# Patient Record
Sex: Male | Born: 1955 | Marital: Married | Smoking: Never smoker
Health system: Southern US, Community
[De-identification: ages and names within clinical notes are randomized; demographics above are authoritative.]

## PROBLEM LIST (undated history)

## (undated) DIAGNOSIS — N200 Calculus of kidney: Secondary | ICD-10-CM

## (undated) DIAGNOSIS — Z87442 Personal history of urinary calculi: Secondary | ICD-10-CM

## (undated) DIAGNOSIS — I10 Essential (primary) hypertension: Secondary | ICD-10-CM

## (undated) DIAGNOSIS — G4700 Insomnia, unspecified: Secondary | ICD-10-CM

## (undated) HISTORY — DX: Essential (primary) hypertension: I10

## (undated) HISTORY — PX: COLONOSCOPY: SHX174

## (undated) HISTORY — DX: Calculus of kidney: N20.0

## (undated) HISTORY — DX: Insomnia, unspecified: G47.00

## (undated) HISTORY — PX: ARTHROSCOPIC REPAIR ACL: SUR80

---

## 2011-06-11 ENCOUNTER — Encounter: Payer: Self-pay | Admitting: Gastroenterology

## 2011-06-25 ENCOUNTER — Ambulatory Visit (AMBULATORY_SURGERY_CENTER): Payer: Managed Care, Other (non HMO) | Admitting: *Deleted

## 2011-06-25 VITALS — Ht 68.0 in | Wt 187.7 lb

## 2011-06-25 DIAGNOSIS — Z1211 Encounter for screening for malignant neoplasm of colon: Secondary | ICD-10-CM

## 2011-06-25 MED ORDER — PEG-KCL-NACL-NASULF-NA ASC-C 100 G PO SOLR: ORAL | Status: DC

## 2011-07-09 ENCOUNTER — Encounter: Payer: Self-pay | Admitting: Gastroenterology

## 2011-07-09 ENCOUNTER — Ambulatory Visit (AMBULATORY_SURGERY_CENTER): Payer: Managed Care, Other (non HMO) | Admitting: Gastroenterology

## 2011-07-09 VITALS — BP 118/81 | HR 56 | Temp 97.9°F | Resp 17 | Ht 68.0 in | Wt 187.0 lb

## 2011-07-09 DIAGNOSIS — K6389 Other specified diseases of intestine: Secondary | ICD-10-CM | POA: Insufficient documentation

## 2011-07-09 DIAGNOSIS — Z1211 Encounter for screening for malignant neoplasm of colon: Secondary | ICD-10-CM

## 2011-07-09 DIAGNOSIS — K5730 Diverticulosis of large intestine without perforation or abscess without bleeding: Secondary | ICD-10-CM

## 2011-07-09 MED ORDER — SODIUM CHLORIDE 0.9 % IV SOLN: 500.0000 mL | INTRAVENOUS | Status: DC

## 2011-07-09 NOTE — Patient Instructions (Signed)
Please refer to the blue and neon green sheets for instructions regarding diet and activity for the rest of today.  Handout on diverticulosis and a high fiber diet given. Resume previous medication.

## 2011-07-10 ENCOUNTER — Telehealth: Payer: Self-pay

## 2011-07-10 NOTE — Telephone Encounter (Signed)
No ID on answering machine. 

## 2020-01-20 DIAGNOSIS — E872 Acidosis: Secondary | ICD-10-CM | POA: Diagnosis not present

## 2020-01-20 DIAGNOSIS — K8309 Other cholangitis: Secondary | ICD-10-CM

## 2020-01-20 DIAGNOSIS — N179 Acute kidney failure, unspecified: Secondary | ICD-10-CM | POA: Diagnosis not present

## 2020-01-20 DIAGNOSIS — K859 Acute pancreatitis without necrosis or infection, unspecified: Secondary | ICD-10-CM

## 2020-01-20 DIAGNOSIS — A419 Sepsis, unspecified organism: Secondary | ICD-10-CM

## 2020-01-21 ENCOUNTER — Inpatient Hospital Stay (HOSPITAL_COMMUNITY): Payer: 59 | Admitting: Anesthesiology

## 2020-01-21 ENCOUNTER — Encounter (HOSPITAL_COMMUNITY): Payer: Self-pay | Admitting: Pulmonary Disease

## 2020-01-21 ENCOUNTER — Encounter (HOSPITAL_COMMUNITY)
Admission: AD | Disposition: A | Discharge status: Home / Self Care | Payer: Self-pay | Source: Other Acute Inpatient Hospital | Attending: Internal Medicine

## 2020-01-21 ENCOUNTER — Inpatient Hospital Stay (HOSPITAL_COMMUNITY): Payer: 59

## 2020-01-21 ENCOUNTER — Other Ambulatory Visit: Payer: Self-pay

## 2020-01-21 ENCOUNTER — Inpatient Hospital Stay (HOSPITAL_COMMUNITY)
Admission: AD | Admit: 2020-01-21 | Discharge: 2020-01-25 | DRG: 853 | Disposition: A | Discharge status: Home / Self Care | Payer: 59 | Source: Other Acute Inpatient Hospital | Attending: Internal Medicine | Admitting: Internal Medicine

## 2020-01-21 DIAGNOSIS — K831 Obstruction of bile duct: Secondary | ICD-10-CM | POA: Diagnosis not present

## 2020-01-21 DIAGNOSIS — R17 Unspecified jaundice: Secondary | ICD-10-CM | POA: Diagnosis not present

## 2020-01-21 DIAGNOSIS — K8063 Calculus of gallbladder and bile duct with acute cholecystitis with obstruction: Secondary | ICD-10-CM | POA: Diagnosis present

## 2020-01-21 DIAGNOSIS — R6521 Severe sepsis with septic shock: Secondary | ICD-10-CM | POA: Diagnosis present

## 2020-01-21 DIAGNOSIS — R1013 Epigastric pain: Secondary | ICD-10-CM

## 2020-01-21 DIAGNOSIS — E869 Volume depletion, unspecified: Secondary | ICD-10-CM | POA: Diagnosis present

## 2020-01-21 DIAGNOSIS — E785 Hyperlipidemia, unspecified: Secondary | ICD-10-CM | POA: Diagnosis present

## 2020-01-21 DIAGNOSIS — K859 Acute pancreatitis without necrosis or infection, unspecified: Secondary | ICD-10-CM | POA: Diagnosis present

## 2020-01-21 DIAGNOSIS — A4151 Sepsis due to Escherichia coli [E. coli]: Secondary | ICD-10-CM | POA: Diagnosis present

## 2020-01-21 DIAGNOSIS — R7989 Other specified abnormal findings of blood chemistry: Secondary | ICD-10-CM

## 2020-01-21 DIAGNOSIS — K851 Biliary acute pancreatitis without necrosis or infection: Secondary | ICD-10-CM | POA: Diagnosis present

## 2020-01-21 DIAGNOSIS — R7881 Bacteremia: Secondary | ICD-10-CM

## 2020-01-21 DIAGNOSIS — K821 Hydrops of gallbladder: Secondary | ICD-10-CM | POA: Diagnosis present

## 2020-01-21 DIAGNOSIS — B9620 Unspecified Escherichia coli [E. coli] as the cause of diseases classified elsewhere: Secondary | ICD-10-CM

## 2020-01-21 DIAGNOSIS — N179 Acute kidney failure, unspecified: Secondary | ICD-10-CM | POA: Diagnosis present

## 2020-01-21 DIAGNOSIS — Z20822 Contact with and (suspected) exposure to covid-19: Secondary | ICD-10-CM | POA: Diagnosis present

## 2020-01-21 DIAGNOSIS — K82A1 Gangrene of gallbladder in cholecystitis: Secondary | ICD-10-CM | POA: Diagnosis present

## 2020-01-21 DIAGNOSIS — R000 Tachycardia, unspecified: Secondary | ICD-10-CM

## 2020-01-21 DIAGNOSIS — I10 Essential (primary) hypertension: Secondary | ICD-10-CM | POA: Diagnosis present

## 2020-01-21 DIAGNOSIS — K8309 Other cholangitis: Secondary | ICD-10-CM

## 2020-01-21 DIAGNOSIS — R933 Abnormal findings on diagnostic imaging of other parts of digestive tract: Secondary | ICD-10-CM

## 2020-01-21 DIAGNOSIS — K8050 Calculus of bile duct without cholangitis or cholecystitis without obstruction: Secondary | ICD-10-CM

## 2020-01-21 HISTORY — PX: PANCREATIC STENT PLACEMENT: SHX5539

## 2020-01-21 HISTORY — PX: ERCP: SHX5425

## 2020-01-21 LAB — COMPREHENSIVE METABOLIC PANEL
ALT: 128 U/L — ABNORMAL HIGH (ref 0–44)
AST: 140 U/L — ABNORMAL HIGH (ref 15–41)
Albumin: 2.3 g/dL — ABNORMAL LOW (ref 3.5–5.0)
Alkaline Phosphatase: 147 U/L — ABNORMAL HIGH (ref 38–126)
Anion gap: 12 (ref 5–15)
BUN: 41 mg/dL — ABNORMAL HIGH (ref 8–23)
CO2: 25 mmol/L (ref 22–32)
Calcium: 7.9 mg/dL — ABNORMAL LOW (ref 8.9–10.3)
Chloride: 100 mmol/L (ref 98–111)
Creatinine, Ser: 2.22 mg/dL — ABNORMAL HIGH (ref 0.61–1.24)
GFR calc Af Amer: 35 mL/min — ABNORMAL LOW (ref >60)
GFR calc non Af Amer: 30 mL/min — ABNORMAL LOW (ref >60)
Glucose, Bld: 114 mg/dL — ABNORMAL HIGH (ref 70–99)
Potassium: 3.7 mmol/L (ref 3.5–5.1)
Sodium: 137 mmol/L (ref 135–145)
Total Bilirubin: 6.7 mg/dL — ABNORMAL HIGH (ref 0.3–1.2)
Total Protein: 5.4 g/dL — ABNORMAL LOW (ref 6.5–8.1)

## 2020-01-21 LAB — GLUCOSE, CAPILLARY
Glucose-Capillary: 88 mg/dL (ref 70–99)
Glucose-Capillary: 132 mg/dL — ABNORMAL HIGH (ref 70–99)

## 2020-01-21 LAB — CBC
HCT: 37.7 % — ABNORMAL LOW (ref 39.0–52.0)
Hemoglobin: 12.9 g/dL — ABNORMAL LOW (ref 13.0–17.0)
MCH: 30.9 pg (ref 26.0–34.0)
MCHC: 34.2 g/dL (ref 30.0–36.0)
MCV: 90.4 fL (ref 80.0–100.0)
Platelets: 172 10*3/uL (ref 150–400)
RBC: 4.17 MIL/uL — ABNORMAL LOW (ref 4.22–5.81)
RDW: 12.6 % (ref 11.5–15.5)
WBC: 16.8 10*3/uL — ABNORMAL HIGH (ref 4.0–10.5)
nRBC: 0 % (ref 0.0–0.2)

## 2020-01-21 LAB — LIPASE, BLOOD: Lipase: 785 U/L — ABNORMAL HIGH (ref 11–51)

## 2020-01-21 LAB — HIV ANTIBODY (ROUTINE TESTING W REFLEX): HIV Screen 4th Generation wRfx: NONREACTIVE

## 2020-01-21 LAB — TRIGLYCERIDES: Triglycerides: 116 mg/dL (ref <150)

## 2020-01-21 LAB — PHOSPHORUS: Phosphorus: 2.8 mg/dL (ref 2.5–4.6)

## 2020-01-21 LAB — MRSA PCR SCREENING: MRSA by PCR: NEGATIVE

## 2020-01-21 LAB — MAGNESIUM: Magnesium: 1.8 mg/dL (ref 1.7–2.4)

## 2020-01-21 SURGERY — ERCP, WITH INTERVENTION IF INDICATED
Anesthesia: General

## 2020-01-21 MED ORDER — SODIUM CHLORIDE 0.9 % IV SOLN
3.0000 g | Freq: Four times a day (QID) | INTRAVENOUS | Status: DC
  Administered 2020-01-21 – 2020-01-25 (×14): 3 g via INTRAVENOUS
  Filled 2020-01-21 (×2): qty 8
  Filled 2020-01-21: qty 3
  Filled 2020-01-21 (×4): qty 8
  Filled 2020-01-21: qty 3
  Filled 2020-01-21 (×6): qty 8
  Filled 2020-01-21 (×2): qty 3
  Filled 2020-01-21 (×2): qty 8

## 2020-01-21 MED ORDER — FENTANYL CITRATE (PF) 100 MCG/2ML IJ SOLN: INTRAMUSCULAR | Status: DC | PRN

## 2020-01-21 MED ORDER — INDOMETHACIN 50 MG RE SUPP
RECTAL | Status: DC | PRN
  Administered 2020-01-21: 100 mg via RECTAL

## 2020-01-21 MED ORDER — DOCUSATE SODIUM 100 MG PO CAPS: 100.0000 mg | ORAL_CAPSULE | Freq: Two times a day (BID) | ORAL | Status: DC | PRN

## 2020-01-21 MED ORDER — INDOMETHACIN 50 MG RE SUPP
100.0000 mg | Freq: Once | RECTAL | Status: AC
  Administered 2020-01-24: 25 mg via RECTAL

## 2020-01-21 MED ORDER — PANTOPRAZOLE SODIUM 40 MG IV SOLR
40.0000 mg | INTRAVENOUS | Status: DC
  Administered 2020-01-21: 40 mg via INTRAVENOUS
  Filled 2020-01-21: qty 40

## 2020-01-21 MED ORDER — DEXTROSE IN LACTATED RINGERS 5 % IV SOLN
INTRAVENOUS | Status: DC
  Administered 2020-01-23 (×2): 1000 mL via INTRAVENOUS

## 2020-01-21 MED ORDER — GLUCAGON HCL RDNA (DIAGNOSTIC) 1 MG IJ SOLR
INTRAMUSCULAR | Status: DC | PRN
Start: 2020-01-21 — End: 2020-01-21
  Administered 2020-01-21 (×4): .25 mg via INTRAVENOUS

## 2020-01-21 MED ORDER — LIDOCAINE 2% (20 MG/ML) 5 ML SYRINGE
INTRAMUSCULAR | Status: DC | PRN
  Administered 2020-01-21: 10 mg via INTRAVENOUS
  Administered 2020-01-21: 40 mg via INTRAVENOUS

## 2020-01-21 MED ORDER — SODIUM CHLORIDE 0.9 % IV SOLN
INTRAVENOUS | Status: DC | PRN
  Administered 2020-01-21: 4 mL

## 2020-01-21 MED ORDER — ONDANSETRON HCL 4 MG/2ML IJ SOLN
4.0000 mg | Freq: Four times a day (QID) | INTRAMUSCULAR | Status: DC | PRN
  Administered 2020-01-21: 4 mg via INTRAVENOUS

## 2020-01-21 MED ORDER — GLUCAGON HCL RDNA (DIAGNOSTIC) 1 MG IJ SOLR
INTRAMUSCULAR | Status: AC
  Filled 2020-01-21: qty 1

## 2020-01-21 MED ORDER — MORPHINE SULFATE (PF) 2 MG/ML IV SOLN
2.0000 mg | INTRAVENOUS | Status: DC | PRN
  Administered 2020-01-21 – 2020-01-24 (×4): 2 mg via INTRAVENOUS
  Filled 2020-01-21 (×5): qty 1

## 2020-01-21 MED ORDER — INDOMETHACIN 50 MG RE SUPP
RECTAL | Status: AC
  Filled 2020-01-21: qty 2

## 2020-01-21 MED ORDER — PIPERACILLIN-TAZOBACTAM 3.375 G IVPB
3.3750 g | Freq: Three times a day (TID) | INTRAVENOUS | Status: DC
  Administered 2020-01-21: 3.375 g via INTRAVENOUS
  Filled 2020-01-21 (×2): qty 50

## 2020-01-21 MED ORDER — LACTATED RINGERS IV SOLN: INTRAVENOUS | Status: DC | PRN

## 2020-01-21 MED ORDER — POLYETHYLENE GLYCOL 3350 17 G PO PACK: 17.0000 g | PACK | Freq: Every day | ORAL | Status: DC | PRN

## 2020-01-21 MED ORDER — SUGAMMADEX SODIUM 200 MG/2ML IV SOLN
INTRAVENOUS | Status: DC | PRN
  Administered 2020-01-21: 300 mg via INTRAVENOUS

## 2020-01-21 MED ORDER — SUCCINYLCHOLINE CHLORIDE 20 MG/ML IJ SOLN
INTRAMUSCULAR | Status: DC | PRN
Start: 2020-01-21 — End: 2020-01-21
  Administered 2020-01-21: 140 mg via INTRAVENOUS

## 2020-01-21 MED ORDER — PROPOFOL 10 MG/ML IV BOLUS
INTRAVENOUS | Status: DC | PRN
  Administered 2020-01-21: 150 mg via INTRAVENOUS

## 2020-01-21 MED ORDER — FENTANYL CITRATE (PF) 100 MCG/2ML IJ SOLN
INTRAMUSCULAR | Status: DC | PRN
  Administered 2020-01-21: 100 ug via INTRAVENOUS

## 2020-01-21 MED ORDER — MAGNESIUM SULFATE 2 GM/50ML IV SOLN
2.0000 g | Freq: Once | INTRAVENOUS | Status: AC
  Administered 2020-01-21: 2 g via INTRAVENOUS
  Filled 2020-01-21: qty 50

## 2020-01-21 MED ORDER — DEXTROSE IN LACTATED RINGERS 5 % IV SOLN: INTRAVENOUS | Status: DC

## 2020-01-21 MED ORDER — PROPOFOL 10 MG/ML IV BOLUS: INTRAVENOUS | Status: DC | PRN

## 2020-01-21 MED ORDER — LIDOCAINE 2% (20 MG/ML) 5 ML SYRINGE: INTRAMUSCULAR | Status: DC | PRN

## 2020-01-21 MED ORDER — HEPARIN SODIUM (PORCINE) 5000 UNIT/ML IJ SOLN: 5000.0000 [IU] | Freq: Three times a day (TID) | INTRAMUSCULAR | Status: DC

## 2020-01-21 MED ORDER — ZOLPIDEM TARTRATE 5 MG PO TABS
5.0000 mg | ORAL_TABLET | Freq: Every evening | ORAL | Status: DC | PRN
  Administered 2020-01-21: 5 mg via ORAL
  Filled 2020-01-21: qty 1

## 2020-01-21 MED ORDER — CHLORHEXIDINE GLUCONATE CLOTH 2 % EX PADS
6.0000 | MEDICATED_PAD | Freq: Every day | CUTANEOUS | Status: DC
  Administered 2020-01-21: 6 via TOPICAL

## 2020-01-21 NOTE — Progress Notes (Signed)
Pt arrives to 5W38 from Endoscopy after being transferred from 40M. Pt alert and oriented to room with no c/o. Bed locked and low, call bell within reach.

## 2020-01-21 NOTE — H&P (View-Only) (Signed)
Consultation  Referring Provider: CCM/Akingbade\ Primary Care Physician:  Melony Overly, MD Primary Gastroenterologist:  none  Reason for Consultation: Acute presumed gallstone pancreatitis with sepsis/E. coli bacteremia  HPI: Jakhai Fant is a 64 y.o. male, who was transferred from [facility] last night after he had presented yesterday with acute abdominal pain nausea and vomiting.  Per notes he had been having intermittent abdominal pain over the past year, but acutely over the prior 3 days had been having abdominal pain nausea and vomiting.  He presented to the emergency room, was noted to be febrile and tachycardic.  After admission he became hypotensive and required pressors and was therefore transferred here. CT of the abdomen and pelvis done at [facility] showed acute cholecystitis, acute interstitial pancreatitis without necrosis or fluid collections.  No definite ductal dilation noted on CT HIDA scan showed nonvisualization of the gallbladder. Initial labs WBC of 19,000, lipase greater than 30,000, LFTs were elevated with T bili of 4.7alkphos 301, AST 351 and ALT 138. INR 1.1 Since arrival here he has been off of pressors. On Zosyn Labs today show WBC of 16.8, hemoglobin 12 point hematocrit 37.7 T bili 6.7/alk phos 147/AST 140/ALT 128 Creatinine 2.2, triglycerides pending Lipase 785  Patient says he is feeling much better today, denies any significant abdominal pain, just feels sore from vomiting.  He says he is achy all over.  He has not had any nausea or vomiting since admission. He has been hemodynamically stable, current blood pressure 112/70, pulse in the 90s, sats good on room air.    Past Medical History:  Diagnosis Date  . Hypertension   . Insomnia   . Kidney stones     Past Surgical History:  Procedure Laterality Date  . ARTHROSCOPIC REPAIR ACL     left, 2012; right 2004    Prior to Admission medications   Medication Sig Start Date End Date  Taking? Authorizing Provider  Calcium Carbonate (CALCIUM 600 PO) Take by mouth daily.      [provider]  Cholecalciferol (VITAMIN D PO) Take by mouth daily.      [provider]  hydrochlorothiazide (HYDRODIURIL) 25 MG tablet Take 25 mg by mouth daily.      [provider]  Multiple Vitamin (MULTIVITAMIN) tablet Take 1 tablet by mouth daily.      [provider]  niacin 500 MG tablet Take 500 mg by mouth daily with breakfast.      [provider]  Omega-3 Fatty Acids (FISH OIL) 1200 MG CAPS Take 2 capsules by mouth 2 (two) times daily.      [provider]  POTASSIUM GLUCONATE PO Take 99 mg by mouth 2 (two) times daily.      [provider]  Red Yeast Rice Extract (RED YEAST RICE PO) Take by mouth daily.      [provider]  sertraline (ZOLOFT) 50 MG tablet Take 50 mg by mouth daily.      [provider]  zolpidem (AMBIEN) 10 MG tablet Take 10 mg by mouth at bedtime as needed.      [provider]    Current Facility-Administered Medications  Medication Dose Route Frequency Provider Last Rate Last Admin  . Chlorhexidine Gluconate Cloth 2 % PADS 6 each  6 each Topical Q0600 Gleason, Otilio Carpen, PA-C   6 each at 01/21/20 0332  . dextrose 5 % in lactated ringers infusion   Intravenous Continuous Gleason, Otilio Carpen, PA-C 150 mL/hr at  01/21/20 0700 Rate Verify at 01/21/20 0700  . docusate sodium (COLACE) capsule 100 mg  100 mg Oral BID PRN Gleason, Otilio Carpen, PA-C      . magnesium sulfate IVPB 2 g 50 mL  2 g Intravenous Once Gleason, Otilio Carpen, PA-C      . morphine 2 MG/ML injection 2 mg  2 mg Intravenous Q4H PRN Gleason, Otilio Carpen, PA-C   2 mg at 01/21/20 0433  . ondansetron (ZOFRAN) injection 4 mg  4 mg Intravenous Q6H PRN Gleason, Otilio Carpen, PA-C      . pantoprazole (PROTONIX) injection 40 mg  40 mg Intravenous Q24H Gleason, Otilio Carpen, PA-C   40 mg at 01/21/20 0516  . piperacillin-tazobactam (ZOSYN) IVPB 3.375 g  3.375  g Intravenous Q8H Bryk, Veronda P, RPH 12.5 mL/hr at 01/21/20 0700 Rate Verify at 01/21/20 0700  . polyethylene glycol (MIRALAX / GLYCOLAX) packet 17 g  17 g Oral Daily PRN Gleason, Otilio Carpen, PA-C      . zolpidem (AMBIEN) tablet 5 mg  5 mg Oral QHS PRN Gleason, Otilio Carpen, PA-C        Allergies as of 01/20/2020  . (No Known Allergies)    Family History  Problem Relation Age of Onset  . Colon cancer Neg Hx   . Stomach cancer Neg Hx     Social History   Socioeconomic History  . Marital status: Married    Spouse name: Not on file  . Number of children: Not on file  . Years of education: Not on file  . Highest education level: Not on file  Occupational History  . Not on file  Tobacco Use  . Smoking status: Never Smoker  . Smokeless tobacco: Never Used  Substance and Sexual Activity  . Alcohol use: Yes    Alcohol/week: 2.0 standard drinks    Types: 2 Glasses of wine per week    Comment: occasional  . Drug use: No  . Sexual activity: Not Currently    Partners: Female  Other Topics Concern  . Not on file  Social History Narrative  . Not on file   Social Determinants of Health   Financial Resource Strain:   . Difficulty of Paying Living Expenses:   Food Insecurity:   . Worried About Charity fundraiser in the Last Year:   . Arboriculturist in the Last Year:   Transportation Needs:   . Film/video editor (Medical):   Marland Kitchen Lack of Transportation (Non-Medical):   Physical Activity:   . Days of Exercise per Week:   . Minutes of Exercise per Session:   Stress:   . Feeling of Stress :   Social Connections:   . Frequency of Communication with Friends and Family:   . Frequency of Social Gatherings with Friends and Family:   . Attends Religious Services:   . Active Member of Clubs or Organizations:   . Attends Archivist Meetings:   Marland Kitchen Marital Status:   Intimate Partner Violence:   . Fear of Current or Ex-Partner:   . Emotionally Abused:   Marland Kitchen Physically Abused:     . Sexually Abused:     Review of Systems: Pertinent positive and negative review of systems were noted in the above HPI section.  All other review of systems was otherwise negative.Marland Kitchen  Physical Exam: Vital signs in last 24 hours: Temp:  [98.1 F (36.7 C)-99.7 F (37.6 C)] 99.3 F (37.4 C) (05/15 0700) Pulse Rate:  [84-95]  87 (05/15 0700) Resp:  [19-30] 27 (05/15 0700) BP: (96-114)/(60-85) 105/85 (05/15 0630) SpO2:  [89 %-99 %] 93 % (05/15 0700) Weight:  [86.2 kg] 86.2 kg (05/15 0315) Last BM Date: 01/20/20 General:   Alert,  Well-developed, well-nourished, jaundiced white male, pleasant and cooperative in NAD Head:  Normocephalic and atraumatic. Eyes:  Sclera icteric conjunctiva pink. Ears:  Normal auditory acuity. Nose:  No deformity, discharge,  or lesions. Mouth:  No deformity or lesions.   Neck:  Supple; no masses or thyromegaly. Lungs:  Clear throughout to auscultation.   No wheezes, crackles, or rhonchi. Heart:  Regular rate and rhythm; no murmurs, clicks, rubs,  or gallops. Abdomen:  Soft, is tender in the epigastrium and right upper quadrant, no rebound BS active,nonpalp mass or hsm.   Rectal:  Deferred  Msk:  Symmetrical without gross deformities. . Pulses:  Normal pulses noted. Extremities:  Without clubbing or edema. Neurologic:  Alert and  oriented x4;  grossly normal neurologically. Skin:  Intact without significant lesions or rashes.. Psych:  Alert and cooperative. Normal mood and affect.  Intake/Output from previous day: 05/14 0701 - 05/15 0700 In: 384.5 [I.V.:363.8; IV Piggyback:20.7] Out: -  Intake/Output this shift: No intake/output data recorded.  Lab Results: Recent Labs    01/21/20 0426  WBC 16.8*  HGB 12.9*  HCT 37.7*  PLT 172   BMET Recent Labs    01/21/20 0426  NA 137  K 3.7  CL 100  CO2 25  GLUCOSE 114*  BUN 41*  CREATININE 2.22*  CALCIUM 7.9*   LFT Recent Labs    01/21/20 0426  PROT 5.4*  ALBUMIN 2.3*  AST 140*  ALT  128*  ALKPHOS 147*  BILITOT 6.7*   PT/INR No results for input(s): LABPROT, INR in the last 72 hours. Hepatitis Panel No results for input(s): HEPBSAG, HCVAB, HEPAIGM, HEPBIGM in the last 72 hours.   IMPRESSION:  #47 64 year old white male with 4-day history of acute abdominal pain nausea vomiting, with work-up consistent with gallstone pancreatitis, with cholangitis and  sepsis.  He has an E. coli bacteremia for which he is on Zosyn. Required pressors yesterday, has been off pressors since arrival here and very hemodynamically stable.  He has had significant improvement in abdominal pain, is afebrile this morning, sats good on room air  He has persistent hyperbilirubinemia with with rising bilirubin, and persistently elevated LFTs concerning for choledocholithiasis  #2 possible cholecystitis #3 acute kidney injury-improving #4 history of hypertension and hyperlipidemia #5  Epigastric pain PLAN: Keep n.p.o. this a.m. Patient will be scheduled for ERCP with stone extraction per Dr. Fuller Plan today.  Procedure was discussed in detail with patient including indications risks and benefits and he is agreeable to proceed. INR was normal yesterday, and Covid negative at [place] Follow-up labs in a.m. Nursing questioning removal of central line, this can be done post ERCP as long as he remains stable.    Amy EsterwoodPA-C  01/21/2020, 8:34 AM  I have reviewed the entire case in detail with the above APP and discussed the plan in detail while we saw this patient together earlier this morning.  Therefore, I agree with the diagnoses recorded above. In addition,  I am unable to review the CT images because they were done at an outside hospital.  All reports from [facility] were reviewed.  My additional thoughts are as follows:  He presented septic with acute biliary pancreatitis yesterday and was briefly on pressors, though I suspect  may just not have been  adequately volume resuscitated at the time.  E. coli bacteremia with persistent hyperbilirubinemia suggests choledocholithiasis and possible cholangitis.  The HIDA scan at [facility] did not fill the gallbladder and therefore suggested cholecystitis, and there is some reported pericholecystic edema and gallbladder wall thickening.  However, I think the appearance of the gallbladder is most likely reactive from the acute pancreatitis, and the HIDA scan results most likely because of an obstructing stone in the common hepatic duct.  The patient has been adequately volume resuscitated, his vital signs are now normal, he has antibiotics on board, and is ready for further intervention.  He needs an ERCP, which we discussed and described for him in detail.  The benefits and risks of the planned procedure were described in detail with the patient or (when appropriate) their health care proxy.  Risks were outlined as including, but not limited to, bleeding, infection, perforation, adverse medication reaction leading to cardiac or pulmonary decompensation, pancreatitis (if ERCP).  The limitation of incomplete mucosal visualization was also discussed.  No guarantees or warranties were given.  Patient at increased risk for cardiopulmonary complications of procedure due to medical comorbidities.  Dr. Lucio Edward will perform this patient's ERCP later today and the case was discussed with him. Further recommendations to follow the ERCP.   Nelida Meuse III Office:[phone]

## 2020-01-21 NOTE — Interval H&P Note (Signed)
History and Physical Interval Note:  01/21/2020 12:50 PM  Adam Fowler  has presented today for surgery, with the diagnosis of gallstone pancreatitis.  The various methods of treatment have been discussed with the patient and family. After consideration of risks, benefits and other options for treatment, the patient has consented to  Procedure(s): ENDOSCOPIC RETROGRADE CHOLANGIOPANCREATOGRAPHY (ERCP) (N/A) as a surgical intervention.  The patient's history has been reviewed, patient examined, no change in status, stable for surgery.  I have reviewed the patient's chart and labs.  Questions were answered to the patient's satisfaction.     Pricilla Riffle. Fuller Plan

## 2020-01-21 NOTE — Anesthesia Procedure Notes (Signed)
Procedure Name: Intubation Date/Time: 01/21/2020 1:37 PM Performed by: Eligha Bridegroom, CRNA Pre-anesthesia Checklist: Patient identified, Emergency Drugs available, Suction available and Patient being monitored Patient Re-evaluated:Patient Re-evaluated prior to induction Oxygen Delivery Method: Circle system utilized Preoxygenation: Pre-oxygenation with 100% oxygen Induction Type: IV induction, Rapid sequence and Cricoid Pressure applied Laryngoscope Size: Mac and 4 Grade View: Grade II Tube type: Oral Tube size: 7.5 mm Number of attempts: 1 Airway Equipment and Method: Stylet Placement Confirmation: ETT inserted through vocal cords under direct vision,  positive ETCO2 and breath sounds checked- equal and bilateral Secured at: 22 cm Tube secured with: Tape Dental Injury: Teeth and Oropharynx as per pre-operative assessment

## 2020-01-21 NOTE — Transfer of Care (Signed)
Immediate Anesthesia Transfer of Care Note  Patient: Adam Fowler  Procedure(s) Performed: ENDOSCOPIC RETROGRADE CHOLANGIOPANCREATOGRAPHY (ERCP) (N/A ) PANCREATIC STENT PLACEMENT  Patient Location: PACU  Anesthesia Type:General  Level of Consciousness: awake and alert   Airway & Oxygen Therapy: Patient Spontanous Breathing  Post-op Assessment: Report given to RN and Post -op Vital signs reviewed and stable  Post vital signs: Reviewed and stable  Last Vitals:  Vitals Value Taken Time  BP 132/81 01/21/20 1508  Temp 37.2 C 01/21/20 1502  Pulse 85 01/21/20 1511  Resp 19 01/21/20 1511  SpO2 90 % 01/21/20 1511  Vitals shown include unvalidated device data.  Last Pain:  Vitals:   01/21/20 1502  TempSrc:   PainSc: 0-No pain      Patients Stated Pain Goal: 0 [id number])  Complications: No apparent anesthesia complications

## 2020-01-21 NOTE — Progress Notes (Signed)
NAME:  Adam Fowler, MRN:  [id number], DOB:  03-Oct-1955, LOS: 0 ADMISSION DATE:  01/21/2020, CONSULTATION DATE:  01/21/20 REFERRING MD:  Oval Linsey, CHIEF COMPLAINT:  Pancreatitis   Brief History   64 year old male who presented to [facility] on 5/14 with upper abdominal pain, nausea and vomiting.  Found to have acute pancreatitis with possible cholecystitis.  He required Levophed so was transferred to [facility] for IR/GI evaluation.  History of present illness   Adam Fowler is a 64 year old male with past medical history of hypertension, hyperlipidemia who has been having intermittent upper abdominal pain for the last year.  He reports this felt like acid reflux, frequently occurring after eating spicy foods.  Over the last 3 days this pain rapidly worsened with nausea and vomiting.  He has not been able to tolerate food for several days and was febrile at home.  He presented to [facility] emergency department where he was tachycardic with fever of 101.85F.  Labs were significant for a lipase of >30,000 with leukocytosis of 19 K, elevated LFTs, AST 351 ALT 138 and bilirubin along with creatinine of 2.  CT abdomen/pelvis showed signs of acute cholecystitis and acute interstitial pancreatitis without necrosis, hemorrhage or fluid collections.  He was seen by surgery who recommended no surgical intervention.  HIDA scan with nonvisualized gallbladder after 2 hours, though exam was terminated early.  During his hospital course, patient became acutely hypotensive and went was started on Levophed.  He was given fluids and Zosyn and transferred to [facility] for GI or IR consult.  On arrival, patient is awake and alert and ambulatory.  He has been off all pressors for hours and blood pressure is stable.  He denies any significant abdominal pain, nausea or vomiting at this time and states he does not frequently drink alcohol.  Past Medical History   has a past medical history of Hypertension, Insomnia, and Kidney  stones.   Significant Hospital Events   5/15 transfer from [place] to [facility]:  GI  Procedures:    Significant Diagnostic Tests:  5/14 HIDA scan>> none visualized gallbladder after 2 hours, this in conjunction with CT findings raises concern for acute cholecystitis, though examination was terminated prematurely.  Chronic cholecystitis and hepatocellular dysfunction are also included in the differential. 5/14 CT abdomen/pelvis>> signs of acute cholecystitis with associated acute interstitial pancreatitis no focal intra-abdominal or peripancreatic fluid collections  Micro Data:  5/14 blood cultures x2 (drawn at [place])>> E. coli without resistant gene detected  Antimicrobials:  Zosyn 5/14-5/15 Unasyn 5/15  Interim history/subjective:  Patient has remained off vasopressors since arrival. Denies abdominal pain and wishes to go home.  Objective   Blood pressure 110/71, pulse 86, temperature 98.9 F (37.2 C), temperature source Oral, resp. rate (!) 27, height 5\' 9"  (1.753 m), weight 86.2 kg, SpO2 93 %.        Intake/Output Summary (Last 24 hours) at 01/21/2020 1149 Last data filed at 01/21/2020 1000 Gross per 24 hour  Intake 866.84 ml  Output 950 ml  Net -83.16 ml   Filed Weights   01/21/20 0315  Weight: 86.2 kg   General: Well-nourished, well-developed male in no acute distress HEENT: MM pink/moist Neuro: Alert and oriented x4, moving all extremities, conversation CV: s1s2 RRR, no m/r/g PULM: CTA B GI: soft, bsx4 active, moderate/severe TTP in the upper abdomen, no rebound Extremities: warm/dry, no edema  Skin: no rashes or lesions   Resolved Hospital Problem list   Hypotension  Assessment &  Plan:   Acute gallstone pancreatitis with sepsis from E. coli bacteremia -Presented to [facility] with sepsis requiring Levophed, pressors have been weaned off and he is hemodynamically stable P: -Step down to Unasyn - GI to take for ERCP today.  Acute kidney  injury -Creatinine 2.2 normal baseline per patient, likely secondary to prerenal volume  depletion P: -Continue IV fluids, repeat metabolic panel and avoid nephrotoxins  Hypertension -Hold HCTZ  Best practice:  Diet: N.p.o. Pain/Anxiety/Delirium protocol (if indicated): As needed morphine VAP protocol (if indicated): N/A DVT prophylaxis: SCDs GI prophylaxis: Stopped Glucose control: Every 6 hour POCT glucose  mobility: Bed rest Code Status: Full code Family Communication:  Disposition: To med-surg.  Labs   CBC: Recent Labs  Lab 01/21/20 0426  WBC 16.8*  HGB 12.9*  HCT 37.7*  MCV 90.4  PLT [id number]    Basic Metabolic Panel: Recent Labs  Lab 01/21/20 0426  NA 137  K 3.7  CL 100  CO2 25  GLUCOSE 114*  BUN 41*  CREATININE 2.22*  CALCIUM 7.9*  MG 1.8  PHOS 2.8   GFR: Estimated Creatinine Clearance: 37 mL/min (A) (by C-G formula based on SCr of 2.22 mg/dL (H)). Recent Labs  Lab 01/21/20 0426  WBC 16.8*    Liver Function Tests: Recent Labs  Lab 01/21/20 0426  AST 140*  ALT 128*  ALKPHOS 147*  BILITOT 6.7*  PROT 5.4*  ALBUMIN 2.3*   Recent Labs  Lab 01/21/20 0426  LIPASE 785*   No results for input(s): AMMONIA in the last 168 hours.  ABG No results found for: PHART, PCO2ART, PO2ART, HCO3, TCO2, ACIDBASEDEF, O2SAT   Coagulation Profile: No results for input(s): INR, PROTIME in the last 168 hours.  Cardiac Enzymes: No results for input(s): CKTOTAL, CKMB, CKMBINDEX, TROPONINI in the last 168 hours.  HbA1C: No results found for: HGBA1C  CBG: Recent Labs  Lab 01/21/20 0351 01/21/20 [place] Adam Trotti, MD

## 2020-01-21 NOTE — Consult Note (Addendum)
Consultation  Referring Provider: CCM/Akingbade\ Primary Care Physician:  Melony Overly, MD Primary Gastroenterologist:  none  Reason for Consultation: Acute presumed gallstone pancreatitis with sepsis/E. coli bacteremia  HPI: Adam Fowler is a 64 y.o. male, who was transferred from [facility] last night after he had presented yesterday with acute abdominal pain nausea and vomiting.  Per notes he had been having intermittent abdominal pain over the past year, but acutely over the prior 3 days had been having abdominal pain nausea and vomiting.  He presented to the emergency room, was noted to be febrile and tachycardic.  After admission he became hypotensive and required pressors and was therefore transferred here. CT of the abdomen and pelvis done at [facility] showed acute cholecystitis, acute interstitial pancreatitis without necrosis or fluid collections.  No definite ductal dilation noted on CT HIDA scan showed nonvisualization of the gallbladder. Initial labs WBC of 19,000, lipase greater than 30,000, LFTs were elevated with T bili of 4.7alkphos 301, AST 351 and ALT 138. INR 1.1 Since arrival here he has been off of pressors. On Zosyn Labs today show WBC of 16.8, hemoglobin 12 point hematocrit 37.7 T bili 6.7/alk phos 147/AST 140/ALT 128 Creatinine 2.2, triglycerides pending Lipase 785  Patient says he is feeling much better today, denies any significant abdominal pain, just feels sore from vomiting.  He says he is achy all over.  He has not had any nausea or vomiting since admission. He has been hemodynamically stable, current blood pressure 112/70, pulse in the 90s, sats good on room air.    Past Medical History:  Diagnosis Date  . Hypertension   . Insomnia   . Kidney stones     Past Surgical History:  Procedure Laterality Date  . ARTHROSCOPIC REPAIR ACL     left, 2012; right 2004    Prior to Admission medications   Medication Sig Start Date End Date  Taking? Authorizing Provider  Calcium Carbonate (CALCIUM 600 PO) Take by mouth daily.      [provider]  Cholecalciferol (VITAMIN D PO) Take by mouth daily.      [provider]  hydrochlorothiazide (HYDRODIURIL) 25 MG tablet Take 25 mg by mouth daily.      [provider]  Multiple Vitamin (MULTIVITAMIN) tablet Take 1 tablet by mouth daily.      [provider]  niacin 500 MG tablet Take 500 mg by mouth daily with breakfast.      [provider]  Omega-3 Fatty Acids (FISH OIL) 1200 MG CAPS Take 2 capsules by mouth 2 (two) times daily.      [provider]  POTASSIUM GLUCONATE PO Take 99 mg by mouth 2 (two) times daily.      [provider]  Red Yeast Rice Extract (RED YEAST RICE PO) Take by mouth daily.      [provider]  sertraline (ZOLOFT) 50 MG tablet Take 50 mg by mouth daily.      [provider]  zolpidem (AMBIEN) 10 MG tablet Take 10 mg by mouth at bedtime as needed.      [provider]    Current Facility-Administered Medications  Medication Dose Route Frequency Provider Last Rate Last Admin  . Chlorhexidine Gluconate Cloth 2 % PADS 6 each  6 each Topical Q0600 Gleason, Otilio Carpen, PA-C   6 each at 01/21/20 0332  . dextrose 5 % in lactated ringers infusion   Intravenous Continuous Gleason, Otilio Carpen, PA-C 150 mL/hr at  01/21/20 0700 Rate Verify at 01/21/20 0700  . docusate sodium (COLACE) capsule 100 mg  100 mg Oral BID PRN Gleason, Otilio Carpen, PA-C      . magnesium sulfate IVPB 2 g 50 mL  2 g Intravenous Once Gleason, Otilio Carpen, PA-C      . morphine 2 MG/ML injection 2 mg  2 mg Intravenous Q4H PRN Gleason, Otilio Carpen, PA-C   2 mg at 01/21/20 0433  . ondansetron (ZOFRAN) injection 4 mg  4 mg Intravenous Q6H PRN Gleason, Otilio Carpen, PA-C      . pantoprazole (PROTONIX) injection 40 mg  40 mg Intravenous Q24H Gleason, Otilio Carpen, PA-C   40 mg at 01/21/20 0516  . piperacillin-tazobactam (ZOSYN) IVPB 3.375 g  3.375  g Intravenous Q8H Bryk, Veronda P, RPH 12.5 mL/hr at 01/21/20 0700 Rate Verify at 01/21/20 0700  . polyethylene glycol (MIRALAX / GLYCOLAX) packet 17 g  17 g Oral Daily PRN Gleason, Otilio Carpen, PA-C      . zolpidem (AMBIEN) tablet 5 mg  5 mg Oral QHS PRN Gleason, Otilio Carpen, PA-C        Allergies as of 01/20/2020  . (No Known Allergies)    Family History  Problem Relation Age of Onset  . Colon cancer Neg Hx   . Stomach cancer Neg Hx     Social History   Socioeconomic History  . Marital status: Married    Spouse name: Not on file  . Number of children: Not on file  . Years of education: Not on file  . Highest education level: Not on file  Occupational History  . Not on file  Tobacco Use  . Smoking status: Never Smoker  . Smokeless tobacco: Never Used  Substance and Sexual Activity  . Alcohol use: Yes    Alcohol/week: 2.0 standard drinks    Types: 2 Glasses of wine per week    Comment: occasional  . Drug use: No  . Sexual activity: Not Currently    Partners: Female  Other Topics Concern  . Not on file  Social History Narrative  . Not on file   Social Determinants of Health   Financial Resource Strain:   . Difficulty of Paying Living Expenses:   Food Insecurity:   . Worried About Charity fundraiser in the Last Year:   . Arboriculturist in the Last Year:   Transportation Needs:   . Film/video editor (Medical):   Marland Kitchen Lack of Transportation (Non-Medical):   Physical Activity:   . Days of Exercise per Week:   . Minutes of Exercise per Session:   Stress:   . Feeling of Stress :   Social Connections:   . Frequency of Communication with Friends and Family:   . Frequency of Social Gatherings with Friends and Family:   . Attends Religious Services:   . Active Member of Clubs or Organizations:   . Attends Archivist Meetings:   Marland Kitchen Marital Status:   Intimate Partner Violence:   . Fear of Current or Ex-Partner:   . Emotionally Abused:   Marland Kitchen Physically Abused:    . Sexually Abused:     Review of Systems: Pertinent positive and negative review of systems were noted in the above HPI section.  All other review of systems was otherwise negative.Marland Kitchen  Physical Exam: Vital signs in last 24 hours: Temp:  [98.1 F (36.7 C)-99.7 F (37.6 C)] 99.3 F (37.4 C) (05/15 0700) Pulse Rate:  [84-95] 87 (  05/15 0700) Resp:  [19-30] 27 (05/15 0700) BP: (96-114)/(60-85) 105/85 (05/15 0630) SpO2:  [89 %-99 %] 93 % (05/15 0700) Weight:  [86.2 kg] 86.2 kg (05/15 0315) Last BM Date: 01/20/20 General:   Alert,  Well-developed, well-nourished, jaundiced white male, pleasant and cooperative in NAD Head:  Normocephalic and atraumatic. Eyes:  Sclera icteric conjunctiva pink. Ears:  Normal auditory acuity. Nose:  No deformity, discharge,  or lesions. Mouth:  No deformity or lesions.   Neck:  Supple; no masses or thyromegaly. Lungs:  Clear throughout to auscultation.   No wheezes, crackles, or rhonchi. Heart:  Regular rate and rhythm; no murmurs, clicks, rubs,  or gallops. Abdomen:  Soft, is tender in the epigastrium and right upper quadrant, no rebound BS active,nonpalp mass or hsm.   Rectal:  Deferred  Msk:  Symmetrical without gross deformities. . Pulses:  Normal pulses noted. Extremities:  Without clubbing or edema. Neurologic:  Alert and  oriented x4;  grossly normal neurologically. Skin:  Intact without significant lesions or rashes.. Psych:  Alert and cooperative. Normal mood and affect.  Intake/Output from previous day: 05/14 0701 - 05/15 0700 In: 384.5 [I.V.:363.8; IV Piggyback:20.7] Out: -  Intake/Output this shift: No intake/output data recorded.  Lab Results: Recent Labs    01/21/20 0426  WBC 16.8*  HGB 12.9*  HCT 37.7*  PLT 172   BMET Recent Labs    01/21/20 0426  NA 137  K 3.7  CL 100  CO2 25  GLUCOSE 114*  BUN 41*  CREATININE 2.22*  CALCIUM 7.9*   LFT Recent Labs    01/21/20 0426  PROT 5.4*  ALBUMIN 2.3*  AST 140*  ALT 128*   ALKPHOS 147*  BILITOT 6.7*   PT/INR No results for input(s): LABPROT, INR in the last 72 hours. Hepatitis Panel No results for input(s): HEPBSAG, HCVAB, HEPAIGM, HEPBIGM in the last 72 hours.   IMPRESSION:  #26 64 year old white male with 4-day history of acute abdominal pain nausea vomiting, with work-up consistent with gallstone pancreatitis, with cholangitis and  sepsis.  He has an E. coli bacteremia for which he is on Zosyn. Required pressors yesterday, has been off pressors since arrival here and very hemodynamically stable.  He has had significant improvement in abdominal pain, is afebrile this morning, sats good on room air  He has persistent hyperbilirubinemia with with rising bilirubin, and persistently elevated LFTs concerning for choledocholithiasis  #2 possible cholecystitis #3 acute kidney injury-improving #4 history of hypertension and hyperlipidemia #5  Epigastric pain PLAN: Keep n.p.o. this a.m. Patient will be scheduled for ERCP with stone extraction per Dr. Fuller Plan today.  Procedure was discussed in detail with patient including indications risks and benefits and he is agreeable to proceed. INR was normal yesterday, and Covid negative at [facility] Follow-up labs in a.m. Nursing questioning removal of central line, this can be done post ERCP as long as he remains stable.    Amy EsterwoodPA-C  01/21/2020, 8:34 AM  I have reviewed the entire case in detail with the above APP and discussed the plan in detail while we saw this patient together earlier this morning.  Therefore, I agree with the diagnoses recorded above. In addition,  I am unable to review the CT images because they were done at an outside hospital.  All reports from [facility] were reviewed.  My additional thoughts are as follows:  He presented septic with acute biliary pancreatitis yesterday and was briefly on pressors, though I suspect may  just not have been adequately  volume resuscitated at the time.  E. coli bacteremia with persistent hyperbilirubinemia suggests choledocholithiasis and possible cholangitis.  The HIDA scan at [facility] did not fill the gallbladder and therefore suggested cholecystitis, and there is some reported pericholecystic edema and gallbladder wall thickening.  However, I think the appearance of the gallbladder is most likely reactive from the acute pancreatitis, and the HIDA scan results most likely because of an obstructing stone in the common hepatic duct.  The patient has been adequately volume resuscitated, his vital signs are now normal, he has antibiotics on board, and is ready for further intervention.  He needs an ERCP, which we discussed and described for him in detail.  The benefits and risks of the planned procedure were described in detail with the patient or (when appropriate) their health care proxy.  Risks were outlined as including, but not limited to, bleeding, infection, perforation, adverse medication reaction leading to cardiac or pulmonary decompensation, pancreatitis (if ERCP).  The limitation of incomplete mucosal visualization was also discussed.  No guarantees or warranties were given.  Patient at increased risk for cardiopulmonary complications of procedure due to medical comorbidities.  Dr. Lucio Edward will perform this patient's ERCP later today and the case was discussed with him. Further recommendations to follow the ERCP.   Nelida Meuse III Office:[phone]

## 2020-01-21 NOTE — Progress Notes (Signed)
Pharmacy Antibiotic Note  Adam Fowler is a 64 y.o. male admitted on 01/21/2020 with acute cholecystitis and acute interstitial pancreatitis with E.coli bacteremia.  Pharmacy has been consulted for Zosyn dosing.  Plan: Zosyn 3.375g IV q8h (4-hour infusion).  Height: 5\' 9"  (175.3 cm) Weight: 86.2 kg (190 lb 0.6 oz) IBW/kg (Calculated) : 70.7  Temp (24hrs), Avg:99.7 F (37.6 C), Min:99.7 F (37.6 C), Max:99.7 F (37.6 C)   Thank you for allowing pharmacy to be a part of this patient's care.  Wynona Neat, PharmD, BCPS  01/21/2020 4:52 AM

## 2020-01-21 NOTE — Anesthesia Preprocedure Evaluation (Addendum)
Anesthesia Evaluation  Patient identified by MRN, date of birth, ID band Patient awake    Reviewed: Allergy & Precautions, NPO status , Patient's Chart, lab work & pertinent test results  History of Anesthesia Complications Negative for: history of anesthetic complications  Airway Mallampati: II  TM Distance: >3 FB Neck ROM: Full    Dental  (+) Dental Advisory Given, Caps   Pulmonary neg pulmonary ROS,  Covid negative at [facility]   breath sounds clear to auscultation       Cardiovascular hypertension, Pt. on medications (-) angina Rhythm:Regular Rate:Normal     Neuro/Psych negative neurological ROS     GI/Hepatic Elevated LFTs: acute cholecystitis, acute interstitial pancreatitis N/v with acute chole   Endo/Other  negative endocrine ROS  Renal/GU Renal InsufficiencyRenal disease (creaat 2.22)     Musculoskeletal   Abdominal   Peds  Hematology negative hematology ROS (+)   Anesthesia Other Findings   Reproductive/Obstetrics                            Anesthesia Physical Anesthesia Plan  ASA: III  Anesthesia Plan: General   Post-op Pain Management:    Induction: Intravenous, Rapid sequence and Cricoid pressure planned  PONV Risk Score and Plan: 2 and Ondansetron and Dexamethasone  Airway Management Planned: Oral ETT  Additional Equipment: None  Intra-op Plan:   Post-operative Plan: Extubation in OR  Informed Consent: I have reviewed the patients History and Physical, chart, labs and discussed the procedure including the risks, benefits and alternatives for the proposed anesthesia with the patient or authorized representative who has indicated his/her understanding and acceptance.     Dental advisory given  Plan Discussed with: CRNA and Surgeon  Anesthesia Plan Comments:        Anesthesia Quick Evaluation

## 2020-01-21 NOTE — Progress Notes (Signed)
Patient arrived via stretcher with carelink from [facility]. Patient is alert and oriented X4. Room air. Walked from stretcher to bathroom then bed. Patient has two PIV's in left arm that flush and are NSL. RT IJ TLC patent, all lines flush with good blood return. NSL. Dressing changed. VSS. CCM MD aware of patients arrival. Patient has clothes in a personal belonging back and his cell phone with charger. C/O headache. MD aware. Awaiting orders.

## 2020-01-21 NOTE — Progress Notes (Signed)
Pharmacy Antibiotic Note  Adam Fowler is a 64 y.o. male admitted on 01/21/2020 with acute cholecystitis and acute gallstone pancreatitis with E.coli bacteremia with no resistance detected (at [facility]). Pharmacy has been consulted to transition Zosyn to Unasyn dosing. SCr 2.22 stable.  Plan: D/c Zosyn >> Unasyn 3g IV q6h Monitor clinical progress, c/s, renal function F/u de-escalation plan/LOT  Height: 5\' 9"  (175.3 cm) Weight: 86.2 kg (190 lb 0.6 oz) IBW/kg (Calculated) : 70.7  Temp (24hrs), Avg:99 F (37.2 C), Min:98.1 F (36.7 C), Max:99.7 F (37.6 C)    Arturo Morton, PharmD, BCPS Please check AMION for all [place] contact numbers Clinical Pharmacist 01/21/2020 8:44 AM

## 2020-01-21 NOTE — Anesthesia Postprocedure Evaluation (Signed)
Anesthesia Post Note  Patient: Adam Fowler  Procedure(s) Performed: ENDOSCOPIC RETROGRADE CHOLANGIOPANCREATOGRAPHY (ERCP) (N/A ) PANCREATIC STENT PLACEMENT     Patient location during evaluation: PACU Anesthesia Type: General Level of consciousness: awake and alert, oriented and patient cooperative Pain management: pain level controlled Vital Signs Assessment: post-procedure vital signs reviewed and stable Respiratory status: spontaneous breathing, nonlabored ventilation and respiratory function stable Cardiovascular status: blood pressure returned to baseline and stable Postop Assessment: no apparent nausea or vomiting Anesthetic complications: no    Last Vitals:  Vitals:   01/21/20 1549 01/21/20 1953  BP: (!) 142/74 131/87  Pulse: 74 67  Resp: 20 18  Temp: 37.1 C   SpO2: 94% 95%    Last Pain:  Vitals:   01/21/20 1953  TempSrc:   PainSc: 0-No pain                 Anahis Furgeson,E. Miquel Lamson

## 2020-01-21 NOTE — H&P (Signed)
NAME:  Rylei Saah, MRN:  [id number], DOB:  1956/06/22, LOS: 0 ADMISSION DATE:  01/21/2020, CONSULTATION DATE:  01/21/20 REFERRING MD:  Oval Linsey, CHIEF COMPLAINT:  Pancreatitis   Brief History   64 year old male who presented to [place] on 5/14 with upper abdominal pain, nausea and vomiting.  Found to have acute pancreatitis with possible cholecystitis.  He required Levophed so was transferred to [facility] for IR/GI evaluation.  History of present illness   Mr. Angello is a 64 year old male with past medical history of hypertension, hyperlipidemia who has been having intermittent upper abdominal pain for the last year.  He reports this felt like acid reflux, frequently occurring after eating spicy foods.  Over the last 3 days this pain rapidly worsened with nausea and vomiting.  He has not been able to tolerate food for several days and was febrile at home.  He presented to [facility] emergency department where he was tachycardic with fever of 101.39F.  Labs were significant for a lipase of >30,000 with leukocytosis of 19 K, elevated LFTs, AST 351 ALT 138 and bilirubin along with creatinine of 2.  CT abdomen/pelvis showed signs of acute cholecystitis and acute interstitial pancreatitis without necrosis, hemorrhage or fluid collections.  He was seen by surgery who recommended no surgical intervention.  HIDA scan with nonvisualized gallbladder after 2 hours, though exam was terminated early.  During his hospital course, patient became acutely hypotensive and went was started on Levophed.  He was given fluids and Zosyn and transferred to [facility] for GI or IR consult.  On arrival, patient is awake and alert and ambulatory.  He has been off all pressors for hours and blood pressure is stable.  He denies any significant abdominal pain, nausea or vomiting at this time and states he does not frequently drink alcohol.  Past Medical History   has a past medical history of Hypertension, Insomnia, and Kidney  stones.   Significant Hospital Events   5/15 transfer from [place] to [place]:  GI  Procedures:    Significant Diagnostic Tests:  5/14 HIDA scan>> none visualized gallbladder after 2 hours, this in conjunction with CT findings raises concern for acute cholecystitis, though examination was terminated prematurely.  Chronic cholecystitis and hepatocellular dysfunction are also included in the differential. 5/14 CT abdomen/pelvis>> signs of acute cholecystitis with associated acute interstitial pancreatitis no focal intra-abdominal or peripancreatic fluid collections  Micro Data:  5/14 blood cultures x2 (drawn at [place])>> E. coli without resistant gene detected  Antimicrobials:  Zosyn 5/14-  Interim history/subjective:  Patient off of pressors prior to transport, arrived hemodynamically stable awake and alert.  Objective   Blood pressure 114/79, pulse 89, temperature 99.7 F (37.6 C), temperature source Oral, resp. rate 19, height 5\' 9"  (1.753 m), weight 86.2 kg, SpO2 97 %.       No intake or output data in the 24 hours ending 01/21/20 0431 Filed Weights   01/21/20 0315  Weight: 86.2 kg   General: Well-nourished, well-developed male in no acute distress HEENT: MM pink/moist Neuro: Alert and oriented x4, moving all extremities, conversation CV: s1s2 RRR, no m/r/g PULM: CTA B GI: soft, bsx4 active, moderate/severe TTP in the upper abdomen, no rebound Extremities: warm/dry, no edema  Skin: no rashes or lesions   Resolved Hospital Problem list   Hypotension  Assessment & Plan:   Acute gallstone pancreatitis with sepsis from E. coli bacteremia -Presented to [facility] with sepsis requiring Levophed, pressors have been weaned off and he  is hemodynamically stable P: -Continue IV fluids, Zosyn, pain and nausea control -Now that he is hemodynamically stable will consult GI for ERCP consideration versus IR percutaneous cholecystostomy -Check  triglycerides -Follow hepatic function -Suspect that patient can be transferred out of the ICU later today if he remains stable   Acute kidney injury -Creatinine 2.2 normal baseline per patient, likely secondary to prerenal volume  depletion P: -Continue IV fluids, repeat metabolic panel and avoid nephrotoxins   Hypertension -Hold HCTZ       Best practice:  Diet: N.p.o. Pain/Anxiety/Delirium protocol (if indicated): As needed morphine VAP protocol (if indicated): N/A DVT prophylaxis: SCDs GI prophylaxis: Protonix Glucose control: Every 6 hour POCT glucose  mobility: Bed rest Code Status: Full code Family Communication:  Disposition: ICU  Labs   CBC: No results for input(s): WBC, NEUTROABS, HGB, HCT, MCV, PLT in the last 168 hours.  Basic Metabolic Panel: No results for input(s): NA, K, CL, CO2, GLUCOSE, BUN, CREATININE, CALCIUM, MG, PHOS in the last 168 hours. GFR: CrCl cannot be calculated (No successful lab value found.). No results for input(s): PROCALCITON, WBC, LATICACIDVEN in the last 168 hours.  Liver Function Tests: No results for input(s): AST, ALT, ALKPHOS, BILITOT, PROT, ALBUMIN in the last 168 hours. No results for input(s): LIPASE, AMYLASE in the last 168 hours. No results for input(s): AMMONIA in the last 168 hours.  ABG No results found for: PHART, PCO2ART, PO2ART, HCO3, TCO2, ACIDBASEDEF, O2SAT   Coagulation Profile: No results for input(s): INR, PROTIME in the last 168 hours.  Cardiac Enzymes: No results for input(s): CKTOTAL, CKMB, CKMBINDEX, TROPONINI in the last 168 hours.  HbA1C: No results found for: HGBA1C  CBG: Recent Labs  Lab 01/21/20 0351  GLUCAP 88    Review of Systems:   Negative except as noted in HPI  Past Medical History  He,  has a past medical history of Hypertension, Insomnia, and Kidney stones.   Surgical History    Past Surgical History:  Procedure Laterality Date  . ARTHROSCOPIC REPAIR ACL     left,  2012; right 2004     Social History   reports that he has never smoked. He has never used smokeless tobacco. He reports current alcohol use of about 2.0 standard drinks of alcohol per week. He reports that he does not use drugs.   Family History   His family history is negative for Colon cancer and Stomach cancer.   Allergies No Known Allergies   Home Medications  Prior to Admission medications   Medication Sig Start Date End Date Taking? Authorizing Provider  Calcium Carbonate (CALCIUM 600 PO) Take by mouth daily.      [provider]  Cholecalciferol (VITAMIN D PO) Take by mouth daily.      [provider]  hydrochlorothiazide (HYDRODIURIL) 25 MG tablet Take 25 mg by mouth daily.      [provider]  Multiple Vitamin (MULTIVITAMIN) tablet Take 1 tablet by mouth daily.      [provider]  niacin 500 MG tablet Take 500 mg by mouth daily with breakfast.      [provider]  Omega-3 Fatty Acids (FISH OIL) 1200 MG CAPS Take 2 capsules by mouth 2 (two) times daily.      [provider]  POTASSIUM GLUCONATE PO Take 99 mg by mouth 2 (two) times daily.      [provider]  Red Yeast Rice Extract (RED YEAST RICE PO) Take by mouth daily.  [provider]  sertraline (ZOLOFT) 50 MG tablet Take 50 mg by mouth daily.      [provider]  zolpidem (AMBIEN) 10 MG tablet Take 10 mg by mouth at bedtime as needed.      [provider]     Critical care time: 23     CRITICAL CARE Performed by: Otilio Carpen Aliesha Dolata   Total critical care time: 56 minutes  Critical care time was exclusive of separately billable procedures and treating other patients.  Critical care was necessary to treat or prevent imminent or life-threatening deterioration.  Critical care was time spent personally by me on the following activities: development of treatment plan with patient and/or surrogate as well as nursing,  discussions with consultants, evaluation of patient's response to treatment, examination of patient, obtaining history from patient or surrogate, ordering and performing treatments and interventions, ordering and review of laboratory studies, ordering and review of radiographic studies, pulse oximetry and re-evaluation of patient's condition.   Otilio Carpen Lezette Kitts, PA-C

## 2020-01-21 NOTE — Op Note (Addendum)
[facility] Patient Name: Adam Fowler Procedure Date : 01/21/2020 MRN: [id number] Attending MD: Ladene Artist , MD Date of Birth: 1956/07/19 CSN: [id number] Age: 64 Admit Type: Inpatient Procedure:                ERCP Indications:              Suspected ascending cholangitis, Jaundice, Elevated                            liver enzymes, Biliary pancreatitis Providers:                Pricilla Riffle. Fuller Plan, MD, Benetta Spar RN, RN, Laverda Sorenson, Technician, Eligha Bridegroom CRNA, CRNA Referring MD:             Velora Heckler Critical Care Medicines:                General Anesthesia Complications:            No immediate complications. Estimated Blood Loss:     Estimated blood loss: none. Procedure:                Pre-Anesthesia Assessment:                           - Prior to the procedure, a History and Physical                            was performed, and patient medications and                            allergies were reviewed. The patient's tolerance of                            previous anesthesia was also reviewed. The risks                            and benefits of the procedure and the sedation                            options and risks were discussed with the patient.                            All questions were answered, and informed consent                            was obtained. Prior Anticoagulants: The patient has                            taken no previous anticoagulant or antiplatelet                            agents. ASA Grade Assessment: III - A patient with  severe systemic disease. After reviewing the risks                            and benefits, the patient was deemed in                            satisfactory condition to undergo the procedure.                           After obtaining informed consent, the scope was                            passed under direct vision. Throughout the                  procedure, the patient's blood pressure, pulse, and                            oxygen saturations were monitored continuously. The                            [id number]) Olympus Duodensocope was                            introduced through the mouth, and used to inject                            contrast into and used to inject contrast into the                            dorsal pancreatic duct. The ERCP was technically                            difficult and complex due to challenging                            cannulation. The patient tolerated the procedure                            well. Scope In: Scope Out: Findings:      The scout film was normal. The esophagus was successfully intubated       under direct vision. The scope was advanced to a normal major papilla in       the descending duodenum without detailed examination of the pharynx,       larynx and associated structures, and upper GI tract. The upper GI tract       was grossly normal. The ventral pancreatic duct was wire cannulated then       deeply cannulated with the short-nosed traction sphincterotome.       Obtaining optimal scope positioning for CBD cannulation was challenging.       Multiple attempts to cannulate the CBD were unsucessful with the 035       jagwire and then with a PD jagwire in place. A small amount of contrast       was injected intentionally incompletely filling the pancreatic duct. The  partially filled ventral pancreatic duct in the head of the pancreas       appear normal. One 4 Fr by 3 cm plastic stent with an external pigtail       and no internal flaps was placed 2 cm into the ventral pancreatic duct       to help to reduce the risk of worsening pancreatitis and to attempt to       assist with CBD cannulation. A small amount of clear liquid flowed       through the stent. Wire CBD cannulation was then attempted with an 025       jagwire and 025 angle tip jagwire. A  small false tract occurred with the       last wire. The PD had been entered multiple times. I was increasingly       concerned about the potential risk of worsening his pancreatitis with       continued attempts to cannulate the CBD and that CBD cannulation had       been unsuccesful so the procedure was discontinued. The PD stent was in       good position during and at the end of the procedure. Impression:               - Common bile duct was not cannulated.                           - Normal partial pancreatogram, ventral duct                            pancreatic head.                           - One plastic stent was placed into the ventral                            pancreatic duct. Recommendation:           - Return patient to hospital ward for ongoing care.                           - Observe patient's clinical course following                            today's ERCP with therapeutic intervention.                           - Continue IV antibiotics.                           - Confirm PD stent falls out or is removed.                           - Repeat ERCP on Monday if he remains stable. Procedure Code(s):        --- Professional ---                           [phone], Endoscopic retrograde  cholangiopancreatography (ERCP); with placement of                            endoscopic stent into biliary or pancreatic duct,                            including pre- and post-dilation and guide wire                            passage, when performed, including sphincterotomy,                            when performed, each stent Diagnosis Code(s):        --- Professional ---                           R17, Unspecified jaundice                           R74.8, Abnormal levels of other serum enzymes CPT copyright 2019 American [employer] Association. All rights reserved. The codes documented in this report are preliminary and upon coder review may  be revised to meet  current compliance requirements. Ladene Artist, MD 01/21/2020 3:10:25 PM This report has been signed electronically. Number of Addenda: 0

## 2020-01-22 DIAGNOSIS — K851 Biliary acute pancreatitis without necrosis or infection: Secondary | ICD-10-CM

## 2020-01-22 DIAGNOSIS — R17 Unspecified jaundice: Secondary | ICD-10-CM

## 2020-01-22 DIAGNOSIS — R7989 Other specified abnormal findings of blood chemistry: Secondary | ICD-10-CM

## 2020-01-22 DIAGNOSIS — K8309 Other cholangitis: Secondary | ICD-10-CM

## 2020-01-22 DIAGNOSIS — R7881 Bacteremia: Secondary | ICD-10-CM

## 2020-01-22 LAB — COMPREHENSIVE METABOLIC PANEL
ALT: 125 U/L — ABNORMAL HIGH (ref 0–44)
AST: 136 U/L — ABNORMAL HIGH (ref 15–41)
Albumin: 2.2 g/dL — ABNORMAL LOW (ref 3.5–5.0)
Alkaline Phosphatase: 181 U/L — ABNORMAL HIGH (ref 38–126)
Anion gap: 11 (ref 5–15)
BUN: 29 mg/dL — ABNORMAL HIGH (ref 8–23)
CO2: 27 mmol/L (ref 22–32)
Calcium: 8.2 mg/dL — ABNORMAL LOW (ref 8.9–10.3)
Chloride: 99 mmol/L (ref 98–111)
Creatinine, Ser: 1.6 mg/dL — ABNORMAL HIGH (ref 0.61–1.24)
GFR calc Af Amer: 52 mL/min — ABNORMAL LOW (ref >60)
GFR calc non Af Amer: 45 mL/min — ABNORMAL LOW (ref >60)
Glucose, Bld: 136 mg/dL — ABNORMAL HIGH (ref 70–99)
Potassium: 3.1 mmol/L — ABNORMAL LOW (ref 3.5–5.1)
Sodium: 137 mmol/L (ref 135–145)
Total Bilirubin: 5.3 mg/dL — ABNORMAL HIGH (ref 0.3–1.2)
Total Protein: 5.3 g/dL — ABNORMAL LOW (ref 6.5–8.1)

## 2020-01-22 LAB — CBC WITH DIFFERENTIAL/PLATELET
Abs Immature Granulocytes: 0.11 10*3/uL — ABNORMAL HIGH (ref 0.00–0.07)
Basophils Absolute: 0 10*3/uL (ref 0.0–0.1)
Basophils Relative: 0 %
Eosinophils Absolute: 0.2 10*3/uL (ref 0.0–0.5)
Eosinophils Relative: 2 %
HCT: 37.3 % — ABNORMAL LOW (ref 39.0–52.0)
Hemoglobin: 12.7 g/dL — ABNORMAL LOW (ref 13.0–17.0)
Immature Granulocytes: 1 %
Lymphocytes Relative: 4 %
Lymphs Abs: 0.5 10*3/uL — ABNORMAL LOW (ref 0.7–4.0)
MCH: 30.8 pg (ref 26.0–34.0)
MCHC: 34 g/dL (ref 30.0–36.0)
MCV: 90.3 fL (ref 80.0–100.0)
Monocytes Absolute: 0.9 10*3/uL (ref 0.1–1.0)
Monocytes Relative: 8 %
Neutro Abs: 10.1 10*3/uL — ABNORMAL HIGH (ref 1.7–7.7)
Neutrophils Relative %: 85 %
Platelets: 171 10*3/uL (ref 150–400)
RBC: 4.13 MIL/uL — ABNORMAL LOW (ref 4.22–5.81)
RDW: 12.6 % (ref 11.5–15.5)
WBC: 11.9 10*3/uL — ABNORMAL HIGH (ref 4.0–10.5)
nRBC: 0 % (ref 0.0–0.2)

## 2020-01-22 LAB — GLUCOSE, CAPILLARY
Glucose-Capillary: 143 mg/dL — ABNORMAL HIGH (ref 70–99)
Glucose-Capillary: 105 mg/dL — ABNORMAL HIGH (ref 70–99)
Glucose-Capillary: 121 mg/dL — ABNORMAL HIGH (ref 70–99)
Glucose-Capillary: 131 mg/dL — ABNORMAL HIGH (ref 70–99)
Glucose-Capillary: 145 mg/dL — ABNORMAL HIGH (ref 70–99)

## 2020-01-22 LAB — LIPASE, BLOOD: Lipase: 651 U/L — ABNORMAL HIGH (ref 11–51)

## 2020-01-22 NOTE — Progress Notes (Addendum)
PROGRESS NOTE        PATIENT DETAILS Name: Adam Fowler Age: 64 y.o. Sex: male Date of Birth: 1956-06-22 Admit Date: 01/21/2020 Admitting Physician Cheryll Dessert, MD [id number], Zannie Cove, MD  Brief Narrative: Patient is a 64 y.o. male with history of HTN, HLD-who presented as a transfer from [facility] health for evaluation of gallstone pancreatitis, E. coli bacteremia, AKI and septic shock.  He was managed in the ICU-briefly required pressors-stabilized-and subsequently transferred to the [facility] hospitalist service.  Significant events: 5/15>> transfer from [place] [facility] to [facility] ICU for septic shock/AKI/gallstone pancreatitis 5/16>> transfer to [facility]  Antimicrobial therapy: Unasyn: 5/15>>  Microbiology data: 5/14: Blood cultures at [facility] health>> E. Coli  Significant studies: 5/14>> HIDA scan: Nonvisualization of the gallbladder after 2 hours 5/14>> chest x-ray: No active disease 5/14>> CT abdomen/pelvis:signs of acute cholecystitis with acute interstitial pancreatitis, bilateral nephrolithiasis, diverticulosis  Procedures : 5/14>> unsuccessful ERCP-unable to cannulate CBD-PD stent placed.  Consults: GI, PCCM  DVT Prophylaxis : SCD's  Subjective: Just does not feel well-very weak-some abdominal pain-no nausea or vomiting.  Last BM was yesterday.  Assessment/Plan: Gallstone pancreatitis with obstructive jaundice: Vague complaints this morning-some abdominal pain-but abdominal exam is relatively benign.  Continue clear liquids-IV fluids-GI will reattempt ERCP on 5/17.  Follow LFTs.  AKI: Likely hemodynamically mediated in setting of gallstone pancreatitis/bacteremia.  Improving with supportive care.  Septic shock with E. coli bacteremia (present on admission): Sepsis physiology has resolved-briefly required pressors on admission-continue Unasyn-we will need to get official results from [employer].  HTN: BP relatively controlled without  the use of any antihypertensives.  HLD: Continue to hold statin.  Diet: Diet Order            Diet clear liquid Room service appropriate? Yes; Fluid consistency: Thin  Diet effective now               Code Status: Full code  Family Communication: Offered to call family-however patient prefers to update family himself.  Disposition Plan: Status is: Inpatient  Remains inpatient appropriate because:Inpatient level of care appropriate due to severity of illness  Dispo: The patient is from: Home              Anticipated d/c is to: Home              Anticipated d/c date is: 2 days              Patient currently is not medically stable to d/c.  Barriers to Discharge: Gallstone pancreatitis with E. coli bacteremia-on IV antibiotics-awaiting ERCP  Antimicrobial agents: Anti-infectives (From admission, onward)   Start     Dose/Rate Route Frequency Ordered Stop   01/21/20 1500  Ampicillin-Sulbactam (UNASYN) 3 g in sodium chloride 0.9 % 100 mL IVPB     3 g 200 mL/hr over 30 Minutes Intravenous Every 6 hours 01/21/20 0845     01/21/20 0600  piperacillin-tazobactam (ZOSYN) IVPB 3.375 g  Status:  Discontinued     3.375 g 12.5 mL/hr over 240 Minutes Intravenous Every 8 hours 01/21/20 0454 01/21/20 0845       Time spent: 25 minutes-Greater than 50% of this time was spent in counseling, explanation of diagnosis, planning of further management, and coordination of care.  MEDICATIONS: Scheduled Meds: . Chlorhexidine Gluconate Cloth  6 each Topical Q0600  . indomethacin  100 mg  Rectal Once   Continuous Infusions: . ampicillin-sulbactam (UNASYN) IV 3 g (01/22/20 1005)  . dextrose 5% lactated ringers 75 mL/hr at 01/22/20 0548   PRN Meds:.morphine injection, ondansetron (ZOFRAN) IV, zolpidem   PHYSICAL EXAM: Vital signs: Vitals:   01/22/20 0500 01/22/20 0552 01/22/20 0800 01/22/20 1200  BP:  125/80 111/88 120/87  Pulse:  74 75 77  Resp:  18 19 20   Temp:  98.3 F (36.8 C)  98.3 F (36.8 C) 98.1 F (36.7 C)  TempSrc:  Oral Oral Oral  SpO2:  93% 94% 94%  Weight: 88.4 kg     Height:       Filed Weights   01/21/20 0315 01/22/20 0500  Weight: 86.2 kg 88.4 kg   Body mass index is 28.78 kg/m.   Gen Exam:Alert awake-not in any distress HEENT:atraumatic, normocephalic Chest: B/L clear to auscultation anteriorly CVS:S1S2 regular Abdomen: Mildly tender in the upper abdomen without any rebound. Extremities:no edema Neurology: Non focal Skin: no rash  I have personally reviewed following labs and imaging studies  LABORATORY DATA: CBC: Recent Labs  Lab 01/21/20 0426 01/22/20 0528  WBC 16.8* 11.9*  NEUTROABS  --  10.1*  HGB 12.9* 12.7*  HCT 37.7* 37.3*  MCV 90.4 90.3  PLT 172 [id number]    Basic Metabolic Panel: Recent Labs  Lab 01/21/20 0426 01/22/20 0528  NA 137 137  K 3.7 3.1*  CL 100 99  CO2 25 27  GLUCOSE 114* 136*  BUN 41* 29*  CREATININE 2.22* 1.60*  CALCIUM 7.9* 8.2*  MG 1.8  --   PHOS 2.8  --     GFR: Estimated Creatinine Clearance: 52 mL/min (A) (by C-G formula based on SCr of 1.6 mg/dL (H)).  Liver Function Tests: Recent Labs  Lab 01/21/20 0426 01/22/20 0528  AST 140* 136*  ALT 128* 125*  ALKPHOS 147* 181*  BILITOT 6.7* 5.3*  PROT 5.4* 5.3*  ALBUMIN 2.3* 2.2*   Recent Labs  Lab 01/21/20 0426 01/22/20 0528  LIPASE 785* 651*   No results for input(s): AMMONIA in the last 168 hours.  Coagulation Profile: No results for input(s): INR, PROTIME in the last 168 hours.  Cardiac Enzymes: No results for input(s): CKTOTAL, CKMB, CKMBINDEX, TROPONINI in the last 168 hours.  BNP (last 3 results) No results for input(s): PROBNP in the last 8760 hours.  Lipid Profile: Recent Labs    01/21/20 0426  TRIG 116    Thyroid Function Tests: No results for input(s): TSH, T4TOTAL, FREET4, T3FREE, THYROIDAB in the last 72 hours.  Anemia Panel: No results for input(s): VITAMINB12, FOLATE, FERRITIN, TIBC, IRON, RETICCTPCT in  the last 72 hours.  Urine analysis: No results found for: COLORURINE, APPEARANCEUR, LABSPEC, PHURINE, GLUCOSEU, HGBUR, BILIRUBINUR, KETONESUR, PROTEINUR, UROBILINOGEN, NITRITE, LEUKOCYTESUR  Sepsis Labs: Lactic Acid, Venous No results found for: LATICACIDVEN  MICROBIOLOGY: Recent Results (from the past 240 hour(s))  MRSA PCR Screening     Status: None   Collection Time: 01/21/20  3:29 AM   Specimen: Nasopharyngeal  Result Value Ref Range Status   MRSA by PCR NEGATIVE NEGATIVE Final    Comment:        The GeneXpert MRSA Assay (FDA approved for NASAL specimens only), is one component of a comprehensive MRSA colonization surveillance program. It is not intended to diagnose MRSA infection nor to guide or monitor treatment for MRSA infections. Performed at [facility], [place] [address] [place], [place] [address]     RADIOLOGY STUDIES/RESULTS: DG ERCP BILIARY &  PANCREATIC DUCTS  Result Date: 01/21/2020 CLINICAL DATA:  Portable fluoroscopic imaging for your CP. EXAM: ERCP TECHNIQUE: Single spot image obtained with the fluoroscopic device and submitted for interpretation post-procedure. FLUOROSCOPY TIME:  Fluoroscopy Time:  5 minutes 43 seconds Radiation Exposure Index (if provided by the fluoroscopic device): 96.95 mGy Number of Acquired Spot Images: 1 COMPARISON:  CT, 01/20/2020 FINDINGS: Single image shows the ERCP endoscope, tip projecting in the right mid abdomen. IMPRESSION: Fluoroscopy for ERCP. These images were submitted for radiologic interpretation only. Please see the procedural report for the amount of contrast and the fluoroscopy time utilized. Electronically Signed   By: Lajean Manes M.D.   On: 01/21/2020 15:28     LOS: 1 day   Oren Binet, MD  [facility]    To contact the attending provider between 7A-7P or the covering provider during after hours 7P-7A, please log into the web site [email or web address] and access using universal [place] password for  that web site. If you do not have the password, please call the hospital operator.  01/22/2020, 2:34 PM

## 2020-01-22 NOTE — H&P (View-Only) (Signed)
Patient ID: Adam Fowler, male   DOB: November 04, 1955, 64 y.o.   MRN: [id number]    Progress Note   Subjective   Day # 2  CC; acute gallstone pancreatitis, cholangitis,bacteremia  ERCP yesterday - unsuccessful,unable to cannulate CBD, PD stent placed   now on Unasyn WBC down to 11.8 CReat improved -1.6  Tbili5.3/alkphos181/alt125/ast 136- improved BC + Ecoli- Randoph  Patient says he does not feel as well today as he did yesterday, not really complaining of much abdominal pain just feels bad all over with aching and discomfort, feels a little bit short of breath but says it is because he hurts when he takes a deep breath.  He denies chest pain or dyspnea.  Says he hurts all over.   Objective   Vital signs in last 24 hours: Temp:  [98.1 F (36.7 C)-98.9 F (37.2 C)] 98.1 F (36.7 C) (05/16 1200) Pulse Rate:  [67-89] 77 (05/16 1200) Resp:  [16-20] 20 (05/16 1200) BP: (111-142)/(70-88) 120/87 (05/16 1200) SpO2:  [92 %-97 %] 94 % (05/16 1200) Weight:  [88.4 kg] 88.4 kg (05/16 0500) Last BM Date: 01/22/20 General:    Older white male in NAD ,not dyspneic, uncomfortable appearing  Heart:  Regular rate and rhythm; no murmurs Lungs: Respirations even and unlabored, lungs CTA bilaterally Abdomen:  Soft, there is mild tenderness across the upper abdomen and into the right upper quadrant, no rebound normal bowel sounds. Extremities:  Without edema. Neurologic:  Alert and oriented,  grossly normal neurologically. Psych:  Cooperative. Normal mood and affect. He is mentating normally   intake/Output from previous day: 05/15 0701 - 05/16 0700 In: 2568.3 [P.O.:200; I.V.:2213.5; IV Piggyback:154.8] Out: 2025 [Urine:2025] Intake/Output this shift: Total I/O In: 960 [P.O.:960] Out: 750 [Urine:750]  Lab Results: Recent Labs    01/21/20 0426 01/22/20 0528  WBC 16.8* 11.9*  HGB 12.9* 12.7*  HCT 37.7* 37.3*  PLT 172 171   BMET Recent Labs    01/21/20 0426 01/22/20 0528  NA 137  137  K 3.7 3.1*  CL 100 99  CO2 25 27  GLUCOSE 114* 136*  BUN 41* 29*  CREATININE 2.22* 1.60*  CALCIUM 7.9* 8.2*   LFT Recent Labs    01/22/20 0528  PROT 5.3*  ALBUMIN 2.2*  AST 136*  ALT 125*  ALKPHOS 181*  BILITOT 5.3*   PT/INR No results for input(s): LABPROT, INR in the last 72 hours.  Studies/Results: DG ERCP BILIARY & PANCREATIC DUCTS  Result Date: 01/21/2020 CLINICAL DATA:  Portable fluoroscopic imaging for your CP. EXAM: ERCP TECHNIQUE: Single spot image obtained with the fluoroscopic device and submitted for interpretation post-procedure. FLUOROSCOPY TIME:  Fluoroscopy Time:  5 minutes 43 seconds Radiation Exposure Index (if provided by the fluoroscopic device): 96.95 mGy Number of Acquired Spot Images: 1 COMPARISON:  CT, 01/20/2020 FINDINGS: Single image shows the ERCP endoscope, tip projecting in the right mid abdomen. IMPRESSION: Fluoroscopy for ERCP. These images were submitted for radiologic interpretation only. Please see the procedural report for the amount of contrast and the fluoroscopy time utilized. Electronically Signed   By: Lajean Manes M.D.   On: 01/21/2020 15:28       Assessment / Plan:    #8  64 year old white male, transferred from [facility] with diagnosis of gallstone pancreatitis.  He had been feeling ill for about 3 days prior to admission. CT scan was consistent with acute cholecystitis, and acute interstitial pancreatitis without necrosis, no ductal dilation definite. HIDA scan was positive for nonvisualized  gallbladder.  Has  had elevated LFTs with T bili of 4.7 yesterday  Picture is consistent with pancreatitis secondary to choledocholithiasis, and subsequent bacteremia with positive blood cultures/E. coli.  Currently on Zosyn  ERCP was done yesterday, however unable to cannulate the common bile duct despite multiple attempts.  Pancreatic duct stent was placed prophylactically.  Parameters have all improved, and patient remains  hemodynamically stable though uncomfortable and generally still feeling ill.  He did not understand that ERCP was not successful yesterday, this was discussed at length today.  Plan; diet was advanced to regular, I do not think he is ready for regular diet, will change back to clear liquids. N.p.o. after midnight Continue Unasyn  Repeat labs in a.m. Patient is scheduled for repeat ERCP with Dr. Rush Landmark tomorrow 01/23/2020.  Procedure was again discussed in detail with patient including indications risks and benefits and he is agreeable to proceed.   Active Problems:   Pancreatitis   Bacteremia   Elevated LFTs   Jaundice     LOS: 1 day   Amy EsterwoodPA-C  01/22/2020, 1:17 PM   I have discussed the case with the PA, and that is the plan I formulated. I personally interviewed and examined the patient.  Aside from his misery of being uncomfortable from his hospital bed in chair, he is otherwise stable to improved from yesterday.  Gallstone pancreatitis stable, lipase down and tenderness decreased.  Cholangitis with sepsis picture at [facility], has come under control with antibiotics and supportive care.  LFTs largely unchanged from yesterday.  Unsuccessful cannulation of the bile duct during ERCP attempt by Dr. Fuller Plan yesterday.  We are trying to get the patient on the schedule for a repeat ERCP attempt with Dr. Rush Landmark tomorrow in hopes of achieving bile duct cannulation, stone clearance and possible stent placement if needed.  If not feasible or unsuccessful, and certainly if patient clinically decompensates with worsening sepsis in the meantime, we will need the help of interventional radiology for percutaneous cholangiogram and external drain.  Signout will be given to Dr. Ardis Hughs, who will take over the consult service tomorrow.  Signout will also be given to Dr. Havery Moros who is on-call overnight.  Total time 35 minutes.  Nelida Meuse III Office: [phone]

## 2020-01-22 NOTE — Progress Notes (Addendum)
Patient ID: Adam Fowler, male   DOB: 1955/12/28, 64 y.o.   MRN: [id number]    Progress Note   Subjective   Day # 2  CC; acute gallstone pancreatitis, cholangitis,bacteremia  ERCP yesterday - unsuccessful,unable to cannulate CBD, PD stent placed   now on Unasyn WBC down to 11.8 CReat improved -1.6  Tbili5.3/alkphos181/alt125/ast 136- improved BC + Ecoli- Randoph  Patient says he does not feel as well today as he did yesterday, not really complaining of much abdominal pain just feels bad all over with aching and discomfort, feels a little bit short of breath but says it is because he hurts when he takes a deep breath.  He denies chest pain or dyspnea.  Says he hurts all over.   Objective   Vital signs in last 24 hours: Temp:  [98.1 F (36.7 C)-98.9 F (37.2 C)] 98.1 F (36.7 C) (05/16 1200) Pulse Rate:  [67-89] 77 (05/16 1200) Resp:  [16-20] 20 (05/16 1200) BP: (111-142)/(70-88) 120/87 (05/16 1200) SpO2:  [92 %-97 %] 94 % (05/16 1200) Weight:  [88.4 kg] 88.4 kg (05/16 0500) Last BM Date: 01/22/20 General:    Older white male in NAD ,not dyspneic, uncomfortable appearing  Heart:  Regular rate and rhythm; no murmurs Lungs: Respirations even and unlabored, lungs CTA bilaterally Abdomen:  Soft, there is mild tenderness across the upper abdomen and into the right upper quadrant, no rebound normal bowel sounds. Extremities:  Without edema. Neurologic:  Alert and oriented,  grossly normal neurologically. Psych:  Cooperative. Normal mood and affect. He is mentating normally   intake/Output from previous day: 05/15 0701 - 05/16 0700 In: 2568.3 [P.O.:200; I.V.:2213.5; IV Piggyback:154.8] Out: 2025 [Urine:2025] Intake/Output this shift: Total I/O In: 960 [P.O.:960] Out: 750 [Urine:750]  Lab Results: Recent Labs    01/21/20 0426 01/22/20 0528  WBC 16.8* 11.9*  HGB 12.9* 12.7*  HCT 37.7* 37.3*  PLT 172 171   BMET Recent Labs    01/21/20 0426 01/22/20 0528  NA 137  137  K 3.7 3.1*  CL 100 99  CO2 25 27  GLUCOSE 114* 136*  BUN 41* 29*  CREATININE 2.22* 1.60*  CALCIUM 7.9* 8.2*   LFT Recent Labs    01/22/20 0528  PROT 5.3*  ALBUMIN 2.2*  AST 136*  ALT 125*  ALKPHOS 181*  BILITOT 5.3*   PT/INR No results for input(s): LABPROT, INR in the last 72 hours.  Studies/Results: DG ERCP BILIARY & PANCREATIC DUCTS  Result Date: 01/21/2020 CLINICAL DATA:  Portable fluoroscopic imaging for your CP. EXAM: ERCP TECHNIQUE: Single spot image obtained with the fluoroscopic device and submitted for interpretation post-procedure. FLUOROSCOPY TIME:  Fluoroscopy Time:  5 minutes 43 seconds Radiation Exposure Index (if provided by the fluoroscopic device): 96.95 mGy Number of Acquired Spot Images: 1 COMPARISON:  CT, 01/20/2020 FINDINGS: Single image shows the ERCP endoscope, tip projecting in the right mid abdomen. IMPRESSION: Fluoroscopy for ERCP. These images were submitted for radiologic interpretation only. Please see the procedural report for the amount of contrast and the fluoroscopy time utilized. Electronically Signed   By: Lajean Manes M.D.   On: 01/21/2020 15:28       Assessment / Plan:    #82  64 year old white male, transferred from [facility] with diagnosis of gallstone pancreatitis.  He had been feeling ill for about 3 days prior to admission. CT scan was consistent with acute cholecystitis, and acute interstitial pancreatitis without necrosis, no ductal dilation definite. HIDA scan was positive for nonvisualized  gallbladder.  Has  had elevated LFTs with T bili of 4.7 yesterday  Picture is consistent with pancreatitis secondary to choledocholithiasis, and subsequent bacteremia with positive blood cultures/E. coli.  Currently on Zosyn  ERCP was done yesterday, however unable to cannulate the common bile duct despite multiple attempts.  Pancreatic duct stent was placed prophylactically.  Parameters have all improved, and patient remains  hemodynamically stable though uncomfortable and generally still feeling ill.  He did not understand that ERCP was not successful yesterday, this was discussed at length today.  Plan; diet was advanced to regular, I do not think he is ready for regular diet, will change back to clear liquids. N.p.o. after midnight Continue Unasyn  Repeat labs in a.m. Patient is scheduled for repeat ERCP with Dr. Rush Landmark tomorrow 01/23/2020.  Procedure was again discussed in detail with patient including indications risks and benefits and he is agreeable to proceed.   Active Problems:   Pancreatitis   Bacteremia   Elevated LFTs   Jaundice     LOS: 1 day   Amy EsterwoodPA-C  01/22/2020, 1:17 PM   I have discussed the case with the PA, and that is the plan I formulated. I personally interviewed and examined the patient.  Aside from his misery of being uncomfortable from his hospital bed in chair, he is otherwise stable to improved from yesterday.  Gallstone pancreatitis stable, lipase down and tenderness decreased.  Cholangitis with sepsis picture at [facility], has come under control with antibiotics and supportive care.  LFTs largely unchanged from yesterday.  Unsuccessful cannulation of the bile duct during ERCP attempt by Dr. Fuller Plan yesterday.  We are trying to get the patient on the schedule for a repeat ERCP attempt with Dr. Rush Landmark tomorrow in hopes of achieving bile duct cannulation, stone clearance and possible stent placement if needed.  If not feasible or unsuccessful, and certainly if patient clinically decompensates with worsening sepsis in the meantime, we will need the help of interventional radiology for percutaneous cholangiogram and external drain.  Signout will be given to Dr. Ardis Hughs, who will take over the consult service tomorrow.  Signout will also be given to Dr. Havery Moros who is on-call overnight.  Total time 35 minutes.  Nelida Meuse III Office: [phone]

## 2020-01-23 ENCOUNTER — Encounter (HOSPITAL_COMMUNITY)
Admission: AD | Disposition: A | Discharge status: Home / Self Care | Payer: Self-pay | Source: Other Acute Inpatient Hospital | Attending: Internal Medicine

## 2020-01-23 ENCOUNTER — Inpatient Hospital Stay (HOSPITAL_COMMUNITY): Payer: 59

## 2020-01-23 ENCOUNTER — Telehealth: Payer: Self-pay

## 2020-01-23 ENCOUNTER — Inpatient Hospital Stay (HOSPITAL_COMMUNITY): Payer: 59 | Admitting: Anesthesiology

## 2020-01-23 ENCOUNTER — Encounter (HOSPITAL_COMMUNITY): Payer: Self-pay | Admitting: Pulmonary Disease

## 2020-01-23 DIAGNOSIS — K851 Biliary acute pancreatitis without necrosis or infection: Secondary | ICD-10-CM

## 2020-01-23 DIAGNOSIS — K869 Disease of pancreas, unspecified: Secondary | ICD-10-CM

## 2020-01-23 DIAGNOSIS — Z9689 Presence of other specified functional implants: Secondary | ICD-10-CM

## 2020-01-23 HISTORY — PX: ERCP: SHX5425

## 2020-01-23 HISTORY — PX: PANCREATIC STENT PLACEMENT: SHX5539

## 2020-01-23 HISTORY — PX: BILIARY STENT PLACEMENT: SHX5538

## 2020-01-23 HISTORY — PX: SPHINCTEROTOMY: SHX5279

## 2020-01-23 LAB — CBC WITH DIFFERENTIAL/PLATELET
Abs Immature Granulocytes: 0.32 10*3/uL — ABNORMAL HIGH (ref 0.00–0.07)
Basophils Absolute: 0 10*3/uL (ref 0.0–0.1)
Basophils Relative: 0 %
Eosinophils Absolute: 0.1 10*3/uL (ref 0.0–0.5)
Eosinophils Relative: 1 %
HCT: 37.2 % — ABNORMAL LOW (ref 39.0–52.0)
Hemoglobin: 13.1 g/dL (ref 13.0–17.0)
Immature Granulocytes: 3 %
Lymphocytes Relative: 7 %
Lymphs Abs: 0.9 10*3/uL (ref 0.7–4.0)
MCH: 31.3 pg (ref 26.0–34.0)
MCHC: 35.2 g/dL (ref 30.0–36.0)
MCV: 88.8 fL (ref 80.0–100.0)
Monocytes Absolute: 1 10*3/uL (ref 0.1–1.0)
Monocytes Relative: 8 %
Neutro Abs: 9.9 10*3/uL — ABNORMAL HIGH (ref 1.7–7.7)
Neutrophils Relative %: 81 %
Platelets: 176 10*3/uL (ref 150–400)
RBC: 4.19 MIL/uL — ABNORMAL LOW (ref 4.22–5.81)
RDW: 12.6 % (ref 11.5–15.5)
WBC: 12.2 10*3/uL — ABNORMAL HIGH (ref 4.0–10.5)
nRBC: 0 % (ref 0.0–0.2)

## 2020-01-23 LAB — COMPREHENSIVE METABOLIC PANEL
ALT: 163 U/L — ABNORMAL HIGH (ref 0–44)
AST: 195 U/L — ABNORMAL HIGH (ref 15–41)
Albumin: 2 g/dL — ABNORMAL LOW (ref 3.5–5.0)
Alkaline Phosphatase: 265 U/L — ABNORMAL HIGH (ref 38–126)
Anion gap: 9 (ref 5–15)
BUN: 16 mg/dL (ref 8–23)
CO2: 24 mmol/L (ref 22–32)
Calcium: 7.8 mg/dL — ABNORMAL LOW (ref 8.9–10.3)
Chloride: 101 mmol/L (ref 98–111)
Creatinine, Ser: 1.09 mg/dL (ref 0.61–1.24)
GFR calc Af Amer: >60 mL/min (ref >60)
GFR calc non Af Amer: >60 mL/min (ref >60)
Glucose, Bld: 138 mg/dL — ABNORMAL HIGH (ref 70–99)
Potassium: 2.8 mmol/L — ABNORMAL LOW (ref 3.5–5.1)
Sodium: 134 mmol/L — ABNORMAL LOW (ref 135–145)
Total Bilirubin: 6.3 mg/dL — ABNORMAL HIGH (ref 0.3–1.2)
Total Protein: 5.3 g/dL — ABNORMAL LOW (ref 6.5–8.1)

## 2020-01-23 LAB — GLUCOSE, CAPILLARY
Glucose-Capillary: 118 mg/dL — ABNORMAL HIGH (ref 70–99)
Glucose-Capillary: 121 mg/dL — ABNORMAL HIGH (ref 70–99)
Glucose-Capillary: 156 mg/dL — ABNORMAL HIGH (ref 70–99)
Glucose-Capillary: 166 mg/dL — ABNORMAL HIGH (ref 70–99)

## 2020-01-23 SURGERY — ERCP, WITH INTERVENTION IF INDICATED
Anesthesia: General

## 2020-01-23 MED ORDER — ROCURONIUM BROMIDE 10 MG/ML (PF) SYRINGE
PREFILLED_SYRINGE | INTRAVENOUS | Status: DC | PRN
  Administered 2020-01-23 (×2): 40 mg via INTRAVENOUS

## 2020-01-23 MED ORDER — GLUCAGON HCL RDNA (DIAGNOSTIC) 1 MG IJ SOLR
INTRAMUSCULAR | Status: DC | PRN
Start: 2020-01-23 — End: 2020-01-23
  Administered 2020-01-23 (×4): .25 mg via INTRAVENOUS

## 2020-01-23 MED ORDER — POTASSIUM CHLORIDE CRYS ER 20 MEQ PO TBCR
40.0000 meq | EXTENDED_RELEASE_TABLET | Freq: Once | ORAL | Status: AC
  Administered 2020-01-23: 40 meq via ORAL
  Filled 2020-01-23: qty 2

## 2020-01-23 MED ORDER — MIDAZOLAM HCL 5 MG/5ML IJ SOLN
INTRAMUSCULAR | Status: DC | PRN
  Administered 2020-01-23: 2 mg via INTRAVENOUS

## 2020-01-23 MED ORDER — SUGAMMADEX SODIUM 200 MG/2ML IV SOLN
INTRAVENOUS | Status: DC | PRN
  Administered 2020-01-23: 200 mg via INTRAVENOUS

## 2020-01-23 MED ORDER — GLUCAGON HCL RDNA (DIAGNOSTIC) 1 MG IJ SOLR
INTRAMUSCULAR | Status: AC
  Filled 2020-01-23: qty 2

## 2020-01-23 MED ORDER — PROPOFOL 10 MG/ML IV BOLUS
INTRAVENOUS | Status: DC | PRN
  Administered 2020-01-23: 150 mg via INTRAVENOUS

## 2020-01-23 MED ORDER — INDOMETHACIN 50 MG RE SUPP: 100.0000 mg | Freq: Once | RECTAL | Status: DC

## 2020-01-23 MED ORDER — CIPROFLOXACIN IN D5W 400 MG/200ML IV SOLN
INTRAVENOUS | Status: AC
  Filled 2020-01-23: qty 200

## 2020-01-23 MED ORDER — LACTATED RINGERS IV SOLN: INTRAVENOUS | Status: DC | PRN

## 2020-01-23 MED ORDER — DEXAMETHASONE SODIUM PHOSPHATE 10 MG/ML IJ SOLN
INTRAMUSCULAR | Status: DC | PRN
  Administered 2020-01-23: 10 mg via INTRAVENOUS

## 2020-01-23 MED ORDER — SODIUM CHLORIDE 0.9 % IV SOLN
INTRAVENOUS | Status: DC | PRN
  Administered 2020-01-23: 25 mL

## 2020-01-23 MED ORDER — PHENYLEPHRINE HCL-NACL 10-0.9 MG/250ML-% IV SOLN
INTRAVENOUS | Status: DC | PRN
  Administered 2020-01-23: 25 ug/min via INTRAVENOUS

## 2020-01-23 MED ORDER — ONDANSETRON HCL 4 MG/2ML IJ SOLN
INTRAMUSCULAR | Status: DC | PRN
  Administered 2020-01-23: 4 mg via INTRAVENOUS

## 2020-01-23 MED ORDER — INDOMETHACIN 50 MG RE SUPP
RECTAL | Status: AC
  Filled 2020-01-23: qty 2

## 2020-01-23 MED ORDER — FENTANYL CITRATE (PF) 250 MCG/5ML IJ SOLN
INTRAMUSCULAR | Status: DC | PRN
  Administered 2020-01-23 (×2): 100 ug via INTRAVENOUS

## 2020-01-23 MED ORDER — INDOMETHACIN 50 MG RE SUPP
RECTAL | Status: DC | PRN
  Administered 2020-01-23: 50 mg via RECTAL

## 2020-01-23 MED ORDER — LIDOCAINE 2% (20 MG/ML) 5 ML SYRINGE
INTRAMUSCULAR | Status: DC | PRN
  Administered 2020-01-23: 60 mg via INTRAVENOUS

## 2020-01-23 MED ORDER — POTASSIUM CHLORIDE 10 MEQ/100ML IV SOLN
10.0000 meq | INTRAVENOUS | Status: AC
  Administered 2020-01-23 (×4): 10 meq via INTRAVENOUS
  Filled 2020-01-23 (×4): qty 100

## 2020-01-23 NOTE — Telephone Encounter (Signed)
-----   Message from Irving Copas., MD sent at 01/23/2020  4:28 PM EDT ----- Regarding: Follow up PD stent Adam Fowler, Let's place a 2-week KUB follow up to ensure the PD stent has fallen out. LFTs in 2-weeks. He will be admitted a few more days. Can schedule follow up as well with me in 4-6 weeks. Thanks. GM

## 2020-01-23 NOTE — Anesthesia Procedure Notes (Signed)
Procedure Name: Intubation Date/Time: 01/23/2020 2:11 PM Performed by: Mariea Clonts, CRNA Pre-anesthesia Checklist: Patient identified, Emergency Drugs available, Suction available and Patient being monitored Patient Re-evaluated:Patient Re-evaluated prior to induction Oxygen Delivery Method: Circle System Utilized Preoxygenation: Pre-oxygenation with 100% oxygen Induction Type: IV induction Ventilation: Mask ventilation without difficulty Laryngoscope Size: Mac and 3 Grade View: Grade II Tube type: Oral Tube size: 7.5 mm Number of attempts: 1 Airway Equipment and Method: Stylet and Oral airway Placement Confirmation: ETT inserted through vocal cords under direct vision,  positive ETCO2 and breath sounds checked- equal and bilateral Tube secured with: Tape Dental Injury: Teeth and Oropharynx as per pre-operative assessment

## 2020-01-23 NOTE — Transfer of Care (Signed)
Immediate Anesthesia Transfer of Care Note  Patient: Adam Fowler  Procedure(s) Performed: ENDOSCOPIC RETROGRADE CHOLANGIOPANCREATOGRAPHY (ERCP) (N/A )  Patient Location: Endoscopy Unit  Anesthesia Type:General  Level of Consciousness: awake, alert  and oriented  Airway & Oxygen Therapy: Patient Spontanous Breathing and Patient connected to nasal cannula oxygen  Post-op Assessment: Report given to RN and Post -op Vital signs reviewed and stable  Post vital signs: Reviewed and stable  Last Vitals:  Vitals Value Taken Time  BP 135/69 01/23/20 1605  Temp    Pulse 66 01/23/20 1609  Resp 16 01/23/20 1609  SpO2 94 % 01/23/20 1609  Vitals shown include unvalidated device data.  Last Pain:  Vitals:   01/23/20 1334  TempSrc: Oral  PainSc: 0-No pain      Patients Stated Pain Goal: 3 ([id number])  Complications: No apparent anesthesia complications

## 2020-01-23 NOTE — Interval H&P Note (Signed)
History and Physical Interval Note:  01/23/2020 2:03 PM  Adam Fowler  has presented today for surgery, with the diagnosis of gallstone pancreatitis.  The various methods of treatment have been discussed with the patient and family. After consideration of risks, benefits and other options for treatment, the patient has consented to  Procedure(s): ENDOSCOPIC RETROGRADE CHOLANGIOPANCREATOGRAPHY (ERCP) (N/A) as a surgical intervention.  The patient's history has been reviewed, patient examined, no change in status, stable for surgery.  I have reviewed the patient's chart and labs.  Questions were answered to the patient's satisfaction.    The risks of an ERCP were discussed at length, including but not limited to the risk of perforation, bleeding, abdominal pain, post-ERCP pancreatitis (while usually mild can be severe and even life threatening).  In setting of persistent elevation of Tb >4 it has been felt that there remains indication for potential obstruction of the biliary tree.  Will attempt cannulation today.   [employer]

## 2020-01-23 NOTE — Anesthesia Preprocedure Evaluation (Addendum)
Anesthesia Evaluation  Patient identified by MRN, date of birth, ID band Patient awake    Reviewed: Allergy & Precautions, NPO status , Patient's Chart, lab work & pertinent test results  Airway Mallampati: II  TM Distance: >3 FB Neck ROM: Full    Dental no notable dental hx.    Pulmonary neg pulmonary ROS,    Pulmonary exam normal breath sounds clear to auscultation       Cardiovascular hypertension, Pt. on medications Normal cardiovascular exam Rhythm:Regular Rate:Normal     Neuro/Psych PSYCHIATRIC DISORDERS negative neurological ROS     GI/Hepatic negative GI ROS, Neg liver ROS,   Endo/Other  Hypokalemia   Renal/GU negative Renal ROS     Musculoskeletal negative musculoskeletal ROS (+)   Abdominal   Peds  Hematology HLD   Anesthesia Other Findings gallstone pancreatitis  Reproductive/Obstetrics                            Anesthesia Physical Anesthesia Plan  ASA: II  Anesthesia Plan: General   Post-op Pain Management:    Induction: Intravenous  PONV Risk Score and Plan: 2 and Ondansetron, Dexamethasone and Treatment may vary due to age or medical condition  Airway Management Planned: Oral ETT  Additional Equipment:   Intra-op Plan:   Post-operative Plan: Extubation in OR  Informed Consent: I have reviewed the patients History and Physical, chart, labs and discussed the procedure including the risks, benefits and alternatives for the proposed anesthesia with the patient or authorized representative who has indicated his/her understanding and acceptance.     Dental advisory given  Plan Discussed with: CRNA  Anesthesia Plan Comments:        Anesthesia Quick Evaluation

## 2020-01-23 NOTE — Op Note (Signed)
[facility] Patient Name: Adam Fowler Procedure Date : 01/23/2020 MRN: [id number] Attending MD: Justice Britain , MD Date of Birth: 1956/04/02 CSN: [id number] Age: 64 Admit Type: Inpatient Procedure:                ERCP Indications:              Bile duct stone(s), Abnormal hepatobiliary                            scintigraphy, Jaundice, Abnormal liver function test Providers:                Justice Britain, MD, Burtis Junes, RN, Lazaro Arms,                            Technician Referring MD:             Pricilla Riffle. Fuller Plan, MD, Milus Banister, MD, Nelida Meuse, MD, [facility] Medicines:                General Anesthesia, Unasyn 3 g IV, Indomethacin 100                            mg PR, Glucagon 1 mg IV Complications:            No immediate complications. Estimated Blood Loss:     Estimated blood loss was minimal. Procedure:                Pre-Anesthesia Assessment:                           - Prior to the procedure, a History and Physical                            was performed, and patient medications and                            allergies were reviewed. The patient's tolerance of                            previous anesthesia was also reviewed. The risks                            and benefits of the procedure and the sedation                            options and risks were discussed with the patient.                            All questions were answered, and informed consent                            was obtained. Prior Anticoagulants: The patient has  taken no previous anticoagulant or antiplatelet                            agents except for aspirin. ASA Grade Assessment:                            III - A patient with severe systemic disease. After                            reviewing the risks and benefits, the patient was                            deemed in satisfactory condition to  undergo the                            procedure.                           After obtaining informed consent, the scope was                            passed under direct vision. Throughout the                            procedure, the patient's blood pressure, pulse, and                            oxygen saturations were monitored continuously. The                            [id number]-Q180V ([id number] Olympus Duodensocope was                            introduced through the mouth, and used to inject                            contrast into and used to inject contrast into the                            bile duct and ventral pancreatic duct. The ERCP was                            technically difficult and complex due to                            challenging cannulation. Successful completion of                            the procedure was aided by performing the maneuvers                            documented (below) in this report. The patient  tolerated the procedure. Scope In: Scope Out: Findings:      The scout film was normal.      The esophagus was successfully intubated under direct vision without       detailed examination of the pharynx, larynx, and associated structures,       and upper GI tract. 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       The major papilla was edematous, liley from previous intervention. The       previously placed pancreatic stent was not present.      Two attempts at biliary cannulation were not successful while using a       wire-guided approach. This led to placement of the wire within the       pancreatic duct and after the 2 placements, decision was made to pursue       a double-wire approach. The wire was left within the pancreatic duct.      The bile duct could not be cannulated with the Autotome sphincterotome.       After trying both short, semi-long, and long-positions, cannulation was       not successful.  Decision made to place one 5 Fr by 5 cm temporary       plastic pancreatic stent with a single external pigtail was placed into       the ventral pancreatic duct to decrease risk of PEP and also in       potential aid of straightening the ampulla to allow easier access. The       stent was in good position.      The bile duct could not be cannulated with the Autotome sphincterotome.       The wire preferentially was going into the presumed previous false-tract       or into the pancreatic duct. I was about to proceed with Needle-Knife       Fistulotomy, but decided to attempt a new sphincterotome and wire. A       short 0.025 inch Revolution Antonietta Breach was passed into the biliary tree       after bowing the sphincterotome signficantly, going into a semi-long       position, and knuckling the tip of the wire going into the ampullary       region. The Jagtome sphincterotome was passed over the guidewire and the       bile duct was then deeply cannulated. Contrast was injected. I       personally interpreted the bile duct images. Ductal flow of contrast was       adequate. Image quality was adequate. Contrast extended to the hepatic       ducts. Opacification of the entire biliary tree except for the cystic       duct and gallbladder was successful. The maximum diameter of the ducts       was 4 mm. A 5 mm biliary sphincterotomy was made with a monofilament       Jagtome sphincterotome using ERBE electrocautery. There was no       post-sphincterotomy bleeding. To discover objects, the biliary tree was       swept with a retrieval balloon starting at the bifurcation. Nothing was       found. An occlusion cholangiogram was performed that showed no further       significant biliary pathology but there was not opacification of the       cystic duct or the  gallbladder.      A pancreatogram was not performed.      The duodenoscope was withdrawn from the patient. Impression:               - The major  papilla was adjacent to a diverticulum                            and was edematous from previous ERCP attempt. Prior                            Pancreatic stent had migrated.                           - Difficult cannulation. Double wire-technique                            failed. Wire continued to be placed into the PD or                            the False tract.                           - One temporary plastic pancreatic stent was placed                            into the ventral pancreatic duct to decrease PEP.                           - After significant maneuvering as noted below, the                            Revolution 0.025 wire was able to be placed into                            the CBD and cannulation was had.                           - A biliary sphincterotomy was performed.                           - The biliary tree was swept and nothing was found.                           - No opacification of the cystic duct or                            gallbladder was noted on occlusion cholangiogram. Recommendation:           - The patient will be observed post-procedure,                            until all discharge criteria are met.                           - Return patient to  hospital ward for ongoing care.                           - Clear liquid diet.                           - Observe patient's clinical course.                           - Watch for pancreatitis, bleeding, perforation,                            and cholangitis.                           - Check liver enzymes (AST, ALT, alkaline                            phosphatase, bilirubin) in the morning.                           - Would hold chemical VTE prophylaxis for 48 hours                            to decrease risk of post-sphincterotomy bleeding.                           - Surgical consultation to discuss timing of likely                            cholecystectomy based on outside HIDA scan and                             imaging concerning for Cholecystitis. No evidence                            today of significant biliary ductal dilation or                            stone disease but the gallbladder did not fill on                            occlusion cholangiogram either suggesting potential                            active cholecystitis/obstruction.                           - Patient will need a KUB 2-view in 10-14 days to                            ensure pancreatic stent has migrated successfully.                            If still present at that time will need to be  scheduled for EGD with stent pull.                           - The findings and recommendations were discussed                            with the patient.                           - The findings and recommendations were discussed                            with the patient's family. Procedure Code(s):        --- Professional ---                           [phone], Endoscopic retrograde                            cholangiopancreatography (ERCP); with placement of                            endoscopic stent into biliary or pancreatic duct,                            including pre- and post-dilation and guide wire                            passage, when performed, including sphincterotomy,                            when performed, each stent Diagnosis Code(s):        --- Professional ---                           K80.50, Calculus of bile duct without cholangitis                            or cholecystitis without obstruction                           R94.5, Abnormal results of liver function studies                           R17, Unspecified jaundice                           K83.8, Other specified diseases of biliary tract CPT copyright 2019 [employer]. All rights reserved. The codes documented in this report are preliminary and upon coder review may  be revised to  meet current compliance requirements. Justice Britain, MD 01/23/2020 4:15:38 PM Number of Addenda: 0

## 2020-01-23 NOTE — Progress Notes (Signed)
PROGRESS NOTE        PATIENT DETAILS Name: Adam Fowler Age: 64 y.o. Sex: male Date of Birth: Nov 15, 1955 Admit Date: 01/21/2020 Admitting Physician Cheryll Dessert, MD [id number], Zannie Cove, MD  Brief Narrative: Patient is a 64 y.o. male with history of HTN, HLD-who presented as a transfer from [facility] health for evaluation of gallstone pancreatitis, E. coli bacteremia, AKI and septic shock.  He was managed in the ICU-briefly required pressors-stabilized-and subsequently transferred to the [facility].  Significant events: 5/15>> transfer from [place] [facility] to [facility] ICU for septic shock/AKI/gallstone pancreatitis 5/16>> transfer to [facility]  Antimicrobial therapy: Unasyn: 5/15>>  Microbiology data: 5/14: Blood cultures at [facility] health>> E. Coli  Significant studies: 5/14>> HIDA scan: Nonvisualization of the gallbladder after 2 hours 5/14>> chest x-ray: No active disease 5/14>> CT abdomen/pelvis:signs of acute cholecystitis with acute interstitial pancreatitis, bilateral nephrolithiasis, diverticulosis  Procedures : 5/14>> unsuccessful ERCP-unable to cannulate CBD-pancreatic duct stent placed.  Consults: GI, PCCM  DVT Prophylaxis : SCD's  Subjective: He feels better compared to yesterday-less abdominal pain-he appears much more comfortable.  Assessment/Plan: Gallstone pancreatitis with obstructive jaundice: Improved-less tenderness today-LFTs significantly elevated-GI following with plans to repeat ERCP on 5/17.  Will await further recommendations from GI.    AKI: Likely hemodynamically mediated in setting of gallstone pancreatitis/bacteremia.  Renal failure has resolved-continue to follow periodically.  Septic shock with E. coli bacteremia (present on admission): Sepsis physiology has resolved-briefly required pressors on admission-continue Unasyn-we will need to get official results from [employer].  HTN: BP relatively  controlled without the use of any antihypertensives.  HLD: Continue to hold statin.  Diet: Diet Order            Diet NPO time specified  Diet effective now               Code Status: Full code  Family Communication: Offered to call family-however patient prefers to update family himself.  Disposition Plan: Status is: Inpatient  Remains inpatient appropriate because:Inpatient level of care appropriate due to severity of illness  Dispo: The patient is from: Home              Anticipated d/c is to: Home              Anticipated d/c date is: 2 days              Patient currently is not medically stable to d/c.  Barriers to Discharge: Gallstone pancreatitis with E. coli bacteremia-on IV antibiotics-awaiting ERCP  Antimicrobial agents: Anti-infectives (From admission, onward)   Start     Dose/Rate Route Frequency Ordered Stop   01/21/20 1500  [MAR Hold]  Ampicillin-Sulbactam (UNASYN) 3 g in sodium chloride 0.9 % 100 mL IVPB     (MAR Hold since Mon 01/23/2020 at 1334.Hold Reason: Transfer to a Procedural area.)   3 g 200 mL/hr over 30 Minutes Intravenous Every 6 hours 01/21/20 0845     01/21/20 0600  piperacillin-tazobactam (ZOSYN) IVPB 3.375 g  Status:  Discontinued     3.375 g 12.5 mL/hr over 240 Minutes Intravenous Every 8 hours 01/21/20 0454 01/21/20 0845       Time spent: 25 minutes-Greater than 50% of this time was spent in counseling, explanation of diagnosis, planning of further management, and coordination of care.  MEDICATIONS: Scheduled Meds: . [MAR Hold] indomethacin  100 mg Rectal Once   Continuous Infusions: . [MAR Hold] ampicillin-sulbactam (UNASYN) IV 3 g (01/23/20 0936)  . dextrose 5% lactated ringers 1,000 mL (01/23/20 0833)   PRN Meds:.[MAR Hold]  morphine injection, [MAR Hold] ondansetron (ZOFRAN) IV, [MAR Hold] zolpidem   PHYSICAL EXAM: Vital signs: Vitals:   01/22/20 2104 01/23/20 0312 01/23/20 0500 01/23/20 1334  BP: (!) 146/86 (!) 143/79   (!) 179/90  Pulse: 82 72    Resp: (!) 21 19  20   Temp: 99.6 F (37.6 C) 98.6 F (37 C)  100.3 F (37.9 C)  TempSrc: Oral Oral  Oral  SpO2: 95% 92%  94%  Weight:   90.3 kg   Height:       Filed Weights   01/21/20 0315 01/22/20 0500 01/23/20 0500  Weight: 86.2 kg 88.4 kg 90.3 kg   Body mass index is 29.4 kg/m.   Gen Exam:Alert awake-not in any distress HEENT:atraumatic, normocephalic Chest: B/L clear to auscultation anteriorly CVS:S1S2 regular Abdomen:soft-tenderness in the upper abdomen without any rebound. Extremities:no edema Neurology: Non focal Skin: no rash  I have personally reviewed following labs and imaging studies  LABORATORY DATA: CBC: Recent Labs  Lab 01/21/20 0426 01/22/20 0528 01/23/20 0438  WBC 16.8* 11.9* 12.2*  NEUTROABS  --  10.1* 9.9*  HGB 12.9* 12.7* 13.1  HCT 37.7* 37.3* 37.2*  MCV 90.4 90.3 88.8  PLT 172 171 0000000    Basic Metabolic Panel: Recent Labs  Lab 01/21/20 0426 01/22/20 0528 01/23/20 0438  NA 137 137 134*  K 3.7 3.1* 2.8*  CL 100 99 101  CO2 25 27 24   GLUCOSE 114* 136* 138*  BUN 41* 29* 16  CREATININE 2.22* 1.60* 1.09  CALCIUM 7.9* 8.2* 7.8*  MG 1.8  --   --   PHOS 2.8  --   --     GFR: Estimated Creatinine Clearance: 77 mL/min (by C-G formula based on SCr of 1.09 mg/dL).  Liver Function Tests: Recent Labs  Lab 01/21/20 0426 01/22/20 0528 01/23/20 0438  AST 140* 136* 195*  ALT 128* 125* 163*  ALKPHOS 147* 181* 265*  BILITOT 6.7* 5.3* 6.3*  PROT 5.4* 5.3* 5.3*  ALBUMIN 2.3* 2.2* 2.0*   Recent Labs  Lab 01/21/20 0426 01/22/20 0528  LIPASE 785* 651*   No results for input(s): AMMONIA in the last 168 hours.  Coagulation Profile: No results for input(s): INR, PROTIME in the last 168 hours.  Cardiac Enzymes: No results for input(s): CKTOTAL, CKMB, CKMBINDEX, TROPONINI in the last 168 hours.  BNP (last 3 results) No results for input(s): PROBNP in the last 8760 hours.  Lipid Profile: Recent Labs     01/21/20 0426  TRIG 116    Thyroid Function Tests: No results for input(s): TSH, T4TOTAL, FREET4, T3FREE, THYROIDAB in the last 72 hours.  Anemia Panel: No results for input(s): VITAMINB12, FOLATE, FERRITIN, TIBC, IRON, RETICCTPCT in the last 72 hours.  Urine analysis: No results found for: COLORURINE, APPEARANCEUR, LABSPEC, PHURINE, GLUCOSEU, HGBUR, BILIRUBINUR, KETONESUR, PROTEINUR, UROBILINOGEN, NITRITE, LEUKOCYTESUR  Sepsis Labs: Lactic Acid, Venous No results found for: LATICACIDVEN  MICROBIOLOGY: Recent Results (from the past 240 hour(s))  MRSA PCR Screening     Status: None   Collection Time: 01/21/20  3:29 AM   Specimen: Nasopharyngeal  Result Value Ref Range Status   MRSA by PCR NEGATIVE NEGATIVE Final    Comment:        The GeneXpert MRSA Assay (FDA approved for NASAL specimens only), is one component  of a comprehensive MRSA colonization surveillance program. It is not intended to diagnose MRSA infection nor to guide or monitor treatment for MRSA infections. Performed at [facility], [place] [address], [place], [place] [address]     RADIOLOGY STUDIES/RESULTS: DG ERCP BILIARY & PANCREATIC DUCTS  Result Date: 01/21/2020 CLINICAL DATA:  Portable fluoroscopic imaging for your CP. EXAM: ERCP TECHNIQUE: Single spot image obtained with the fluoroscopic device and submitted for interpretation post-procedure. FLUOROSCOPY TIME:  Fluoroscopy Time:  5 minutes 43 seconds Radiation Exposure Index (if provided by the fluoroscopic device): 96.95 mGy Number of Acquired Spot Images: 1 COMPARISON:  CT, 01/20/2020 FINDINGS: Single image shows the ERCP endoscope, tip projecting in the right mid abdomen. IMPRESSION: Fluoroscopy for ERCP. These images were submitted for radiologic interpretation only. Please see the procedural report for the amount of contrast and the fluoroscopy time utilized. Electronically Signed   By: Lajean Manes M.D.   On: 01/21/2020 15:28     LOS: 2 days     Oren Binet, MD  [facility]    To contact the attending provider between 7A-7P or the covering provider during after hours 7P-7A, please log into the web site [email or web address] and access using universal [place] password for that web site. If you do not have the password, please call the hospital operator.  01/23/2020, 1:42 PM

## 2020-01-23 NOTE — Anesthesia Postprocedure Evaluation (Signed)
Anesthesia Post Note  Patient: Airon Shifflett  Procedure(s) Performed: ENDOSCOPIC RETROGRADE CHOLANGIOPANCREATOGRAPHY (ERCP) (N/A )     Patient location during evaluation: PACU Anesthesia Type: General Level of consciousness: awake Pain management: pain level controlled Vital Signs Assessment: post-procedure vital signs reviewed and stable Respiratory status: spontaneous breathing, nonlabored ventilation, respiratory function stable and patient connected to nasal cannula oxygen Cardiovascular status: blood pressure returned to baseline and stable Postop Assessment: no apparent nausea or vomiting Anesthetic complications: no    Last Vitals:  Vitals:   01/23/20 1640 01/23/20 1703  BP: (!) 142/94 (!) 157/92  Pulse:  60  Resp:  19  Temp:  37 C  SpO2:  91%    Last Pain:  Vitals:   01/23/20 1703  TempSrc: Oral  PainSc: 0-No pain                 Lawton Dollinger P Lucelia Lacey

## 2020-01-23 NOTE — Progress Notes (Signed)
Patient back from Endo. Alert and oriented x 4; no acute distress noted, no complaints. VS stable. Will continue to monitor.

## 2020-01-24 ENCOUNTER — Encounter (HOSPITAL_COMMUNITY)
Admission: AD | Disposition: A | Discharge status: Home / Self Care | Payer: Self-pay | Source: Other Acute Inpatient Hospital | Attending: Internal Medicine

## 2020-01-24 ENCOUNTER — Inpatient Hospital Stay (HOSPITAL_COMMUNITY): Payer: 59 | Admitting: Certified Registered Nurse Anesthetist

## 2020-01-24 ENCOUNTER — Encounter (HOSPITAL_COMMUNITY): Payer: Self-pay | Admitting: Pulmonary Disease

## 2020-01-24 HISTORY — PX: CHOLECYSTECTOMY: SHX55

## 2020-01-24 LAB — SURGICAL PCR SCREEN
MRSA, PCR: NEGATIVE
Staphylococcus aureus: NEGATIVE

## 2020-01-24 LAB — COMPREHENSIVE METABOLIC PANEL
ALT: 173 U/L — ABNORMAL HIGH (ref 0–44)
AST: 135 U/L — ABNORMAL HIGH (ref 15–41)
Albumin: 2.1 g/dL — ABNORMAL LOW (ref 3.5–5.0)
Alkaline Phosphatase: 274 U/L — ABNORMAL HIGH (ref 38–126)
Anion gap: 10 (ref 5–15)
BUN: 20 mg/dL (ref 8–23)
CO2: 25 mmol/L (ref 22–32)
Calcium: 8.2 mg/dL — ABNORMAL LOW (ref 8.9–10.3)
Chloride: 102 mmol/L (ref 98–111)
Creatinine, Ser: 1.16 mg/dL (ref 0.61–1.24)
GFR calc Af Amer: >60 mL/min (ref >60)
GFR calc non Af Amer: >60 mL/min (ref >60)
Glucose, Bld: 149 mg/dL — ABNORMAL HIGH (ref 70–99)
Potassium: 3.9 mmol/L (ref 3.5–5.1)
Sodium: 137 mmol/L (ref 135–145)
Total Bilirubin: 3.9 mg/dL — ABNORMAL HIGH (ref 0.3–1.2)
Total Protein: 5.4 g/dL — ABNORMAL LOW (ref 6.5–8.1)

## 2020-01-24 LAB — CBC WITH DIFFERENTIAL/PLATELET
Abs Immature Granulocytes: 0.51 10*3/uL — ABNORMAL HIGH (ref 0.00–0.07)
Basophils Absolute: 0.1 10*3/uL (ref 0.0–0.1)
Basophils Relative: 1 %
Eosinophils Absolute: 0 10*3/uL (ref 0.0–0.5)
Eosinophils Relative: 0 %
HCT: 39.5 % (ref 39.0–52.0)
Hemoglobin: 13.5 g/dL (ref 13.0–17.0)
Immature Granulocytes: 5 %
Lymphocytes Relative: 8 %
Lymphs Abs: 0.9 10*3/uL (ref 0.7–4.0)
MCH: 30.9 pg (ref 26.0–34.0)
MCHC: 34.2 g/dL (ref 30.0–36.0)
MCV: 90.4 fL (ref 80.0–100.0)
Monocytes Absolute: 0.6 10*3/uL (ref 0.1–1.0)
Monocytes Relative: 5 %
Neutro Abs: 9.3 10*3/uL — ABNORMAL HIGH (ref 1.7–7.7)
Neutrophils Relative %: 81 %
Platelets: 194 10*3/uL (ref 150–400)
RBC: 4.37 MIL/uL (ref 4.22–5.81)
RDW: 12.8 % (ref 11.5–15.5)
WBC: 11.4 10*3/uL — ABNORMAL HIGH (ref 4.0–10.5)
nRBC: 0 % (ref 0.0–0.2)

## 2020-01-24 LAB — GLUCOSE, CAPILLARY
Glucose-Capillary: 150 mg/dL — ABNORMAL HIGH (ref 70–99)
Glucose-Capillary: 130 mg/dL — ABNORMAL HIGH (ref 70–99)
Glucose-Capillary: 130 mg/dL — ABNORMAL HIGH (ref 70–99)

## 2020-01-24 LAB — LIPASE, BLOOD: Lipase: 84 U/L — ABNORMAL HIGH (ref 11–51)

## 2020-01-24 SURGERY — LAPAROSCOPIC CHOLECYSTECTOMY WITH INTRAOPERATIVE CHOLANGIOGRAM
Anesthesia: General | Site: Abdomen

## 2020-01-24 MED ORDER — SODIUM CHLORIDE 0.9% FLUSH: 10.0000 mL | INTRAVENOUS | Status: DC | PRN

## 2020-01-24 MED ORDER — PHENYLEPHRINE 40 MCG/ML (10ML) SYRINGE FOR IV PUSH (FOR BLOOD PRESSURE SUPPORT)
PREFILLED_SYRINGE | INTRAVENOUS | Status: AC
  Filled 2020-01-24: qty 10

## 2020-01-24 MED ORDER — LIDOCAINE-EPINEPHRINE 1 %-1:100000 IJ SOLN
INTRAMUSCULAR | Status: AC
  Filled 2020-01-24: qty 1

## 2020-01-24 MED ORDER — DEXAMETHASONE SODIUM PHOSPHATE 10 MG/ML IJ SOLN
INTRAMUSCULAR | Status: DC | PRN
  Administered 2020-01-24: 5 mg via INTRAVENOUS

## 2020-01-24 MED ORDER — LIDOCAINE-EPINEPHRINE 1 %-1:100000 IJ SOLN
INTRAMUSCULAR | Status: DC | PRN
  Administered 2020-01-24: 20 mL via INTRAMUSCULAR

## 2020-01-24 MED ORDER — ONDANSETRON HCL 4 MG/2ML IJ SOLN
INTRAMUSCULAR | Status: AC
  Filled 2020-01-24: qty 2

## 2020-01-24 MED ORDER — MIDAZOLAM HCL 2 MG/2ML IJ SOLN
INTRAMUSCULAR | Status: DC | PRN
  Administered 2020-01-24: 2 mg via INTRAVENOUS

## 2020-01-24 MED ORDER — ACETAMINOPHEN 325 MG PO TABS
650.0000 mg | ORAL_TABLET | Freq: Three times a day (TID) | ORAL | Status: DC
  Administered 2020-01-24 – 2020-01-25 (×3): 650 mg via ORAL
  Filled 2020-01-24 (×3): qty 2

## 2020-01-24 MED ORDER — PHENYLEPHRINE HCL (PRESSORS) 10 MG/ML IV SOLN
INTRAVENOUS | Status: DC | PRN
  Administered 2020-01-24 (×2): 80 ug via INTRAVENOUS
  Administered 2020-01-24: 120 ug via INTRAVENOUS

## 2020-01-24 MED ORDER — ROCURONIUM BROMIDE 10 MG/ML (PF) SYRINGE
PREFILLED_SYRINGE | INTRAVENOUS | Status: DC | PRN
  Administered 2020-01-24: 20 mg via INTRAVENOUS
  Administered 2020-01-24: 50 mg via INTRAVENOUS

## 2020-01-24 MED ORDER — HYDROMORPHONE HCL 1 MG/ML IJ SOLN
0.2500 mg | INTRAMUSCULAR | Status: DC | PRN
  Administered 2020-01-24 (×2): 0.5 mg via INTRAVENOUS

## 2020-01-24 MED ORDER — ACETAMINOPHEN 325 MG PO TABS: 650.0000 mg | ORAL_TABLET | Freq: Four times a day (QID) | ORAL | Status: DC

## 2020-01-24 MED ORDER — SODIUM CHLORIDE 0.9% FLUSH
10.0000 mL | Freq: Two times a day (BID) | INTRAVENOUS | Status: DC
  Administered 2020-01-24 – 2020-01-25 (×2): 10 mL

## 2020-01-24 MED ORDER — PROMETHAZINE HCL 25 MG/ML IJ SOLN: 6.2500 mg | INTRAMUSCULAR | Status: DC | PRN

## 2020-01-24 MED ORDER — BUPIVACAINE HCL (PF) 0.25 % IJ SOLN
INTRAMUSCULAR | Status: AC
  Filled 2020-01-24: qty 30

## 2020-01-24 MED ORDER — PROPOFOL 10 MG/ML IV BOLUS
INTRAVENOUS | Status: DC | PRN
  Administered 2020-01-24: 150 mg via INTRAVENOUS

## 2020-01-24 MED ORDER — OXYCODONE HCL 5 MG PO TABS: 5.0000 mg | ORAL_TABLET | Freq: Once | ORAL | Status: DC | PRN

## 2020-01-24 MED ORDER — LIDOCAINE 2% (20 MG/ML) 5 ML SYRINGE
INTRAMUSCULAR | Status: DC | PRN
  Administered 2020-01-24: 40 mg via INTRAVENOUS

## 2020-01-24 MED ORDER — OXYCODONE HCL 5 MG/5ML PO SOLN: 5.0000 mg | Freq: Once | ORAL | Status: DC | PRN

## 2020-01-24 MED ORDER — SODIUM CHLORIDE 0.9 % IR SOLN
Status: DC | PRN
  Administered 2020-01-24: 1000 mL

## 2020-01-24 MED ORDER — SUGAMMADEX SODIUM 200 MG/2ML IV SOLN
INTRAVENOUS | Status: DC | PRN
  Administered 2020-01-24: 200 mg via INTRAVENOUS

## 2020-01-24 MED ORDER — FENTANYL CITRATE (PF) 250 MCG/5ML IJ SOLN
INTRAMUSCULAR | Status: AC
  Filled 2020-01-24: qty 5

## 2020-01-24 MED ORDER — 0.9 % SODIUM CHLORIDE (POUR BTL) OPTIME
TOPICAL | Status: DC | PRN
  Administered 2020-01-24: 1000 mL

## 2020-01-24 MED ORDER — LIDOCAINE 2% (20 MG/ML) 5 ML SYRINGE
INTRAMUSCULAR | Status: AC
  Filled 2020-01-24: qty 5

## 2020-01-24 MED ORDER — EPHEDRINE 5 MG/ML INJ
INTRAVENOUS | Status: AC
  Filled 2020-01-24: qty 10

## 2020-01-24 MED ORDER — HEMOSTATIC AGENTS (NO CHARGE) OPTIME
TOPICAL | Status: DC | PRN
  Administered 2020-01-24 (×2): 1 via TOPICAL

## 2020-01-24 MED ORDER — MEPERIDINE HCL 25 MG/ML IJ SOLN: 6.2500 mg | INTRAMUSCULAR | Status: DC | PRN

## 2020-01-24 MED ORDER — SUCCINYLCHOLINE CHLORIDE 200 MG/10ML IV SOSY
PREFILLED_SYRINGE | INTRAVENOUS | Status: AC
  Filled 2020-01-24: qty 10

## 2020-01-24 MED ORDER — EPHEDRINE SULFATE 50 MG/ML IJ SOLN
INTRAMUSCULAR | Status: DC | PRN
Start: 2020-01-24 — End: 2020-01-24
  Administered 2020-01-24: 10 mg via INTRAVENOUS

## 2020-01-24 MED ORDER — ONDANSETRON HCL 4 MG/2ML IJ SOLN
INTRAMUSCULAR | Status: DC | PRN
  Administered 2020-01-24: 4 mg via INTRAVENOUS

## 2020-01-24 MED ORDER — OXYCODONE HCL 5 MG PO TABS
5.0000 mg | ORAL_TABLET | ORAL | Status: DC | PRN
  Administered 2020-01-24: 10 mg via ORAL
  Administered 2020-01-25: 5 mg via ORAL
  Filled 2020-01-24: qty 2
  Filled 2020-01-24: qty 1

## 2020-01-24 MED ORDER — ROCURONIUM BROMIDE 10 MG/ML (PF) SYRINGE
PREFILLED_SYRINGE | INTRAVENOUS | Status: AC
  Filled 2020-01-24: qty 10

## 2020-01-24 MED ORDER — FENTANYL CITRATE (PF) 250 MCG/5ML IJ SOLN
INTRAMUSCULAR | Status: DC | PRN
  Administered 2020-01-24 (×2): 50 ug via INTRAVENOUS
  Administered 2020-01-24: 100 ug via INTRAVENOUS
  Administered 2020-01-24: 50 ug via INTRAVENOUS

## 2020-01-24 MED ORDER — LACTATED RINGERS IV SOLN: INTRAVENOUS | Status: DC

## 2020-01-24 MED ORDER — ACETAMINOPHEN 10 MG/ML IV SOLN: 1000.0000 mg | Freq: Once | INTRAVENOUS | Status: DC | PRN

## 2020-01-24 MED ORDER — HYDROMORPHONE HCL 1 MG/ML IJ SOLN: 0.2500 mg | INTRAMUSCULAR | Status: DC | PRN

## 2020-01-24 MED ORDER — MIDAZOLAM HCL 2 MG/2ML IJ SOLN: 0.5000 mg | Freq: Once | INTRAMUSCULAR | Status: DC | PRN

## 2020-01-24 MED ORDER — HYDROMORPHONE HCL 1 MG/ML IJ SOLN
INTRAMUSCULAR | Status: AC
  Filled 2020-01-24: qty 1

## 2020-01-24 MED ORDER — MIDAZOLAM HCL 2 MG/2ML IJ SOLN
INTRAMUSCULAR | Status: AC
  Filled 2020-01-24: qty 2

## 2020-01-24 MED ORDER — LACTATED RINGERS IV SOLN: INTRAVENOUS | Status: DC | PRN

## 2020-01-24 MED ORDER — DEXAMETHASONE SODIUM PHOSPHATE 10 MG/ML IJ SOLN
INTRAMUSCULAR | Status: AC
  Filled 2020-01-24: qty 2

## 2020-01-24 SURGICAL SUPPLY — 49 items
APPLIER CLIP 5 13 M/L LIGAMAX5 (MISCELLANEOUS) ×3
BIOPATCH RED 1 DISK 7.0 (GAUZE/BANDAGES/DRESSINGS) ×2 IMPLANT
BIOPATCH RED 1IN DISK 7.0MM (GAUZE/BANDAGES/DRESSINGS) ×1
BLADE CLIPPER SURG (BLADE) ×3 IMPLANT
CANISTER SUCT 3000ML PPV (MISCELLANEOUS) ×3 IMPLANT
CHLORAPREP W/TINT 26 (MISCELLANEOUS) ×3 IMPLANT
CLIP APPLIE 5 13 M/L LIGAMAX5 (MISCELLANEOUS) ×1 IMPLANT
COVER MAYO STAND STRL (DRAPES) IMPLANT
COVER SURGICAL LIGHT HANDLE (MISCELLANEOUS) ×3 IMPLANT
COVER WAND RF STERILE (DRAPES) IMPLANT
DERMABOND ADVANCED (GAUZE/BANDAGES/DRESSINGS) ×2
DERMABOND ADVANCED .7 DNX12 (GAUZE/BANDAGES/DRESSINGS) ×1 IMPLANT
DISSECTOR BLUNT TIP ENDO 5MM (MISCELLANEOUS) ×3 IMPLANT
DRAIN CHANNEL 19F RND (DRAIN) ×3 IMPLANT
DRAPE C-ARM 42X120 X-RAY (DRAPES) IMPLANT
DRSG TEGADERM 2-3/8X2-3/4 SM (GAUZE/BANDAGES/DRESSINGS) ×3 IMPLANT
ELECT REM PT RETURN 9FT ADLT (ELECTROSURGICAL) ×3
ELECTRODE REM PT RTRN 9FT ADLT (ELECTROSURGICAL) ×1 IMPLANT
EVACUATOR SILICONE 100CC (DRAIN) ×3 IMPLANT
GLOVE BIO SURGEON STRL SZ 6.5 (GLOVE) ×2 IMPLANT
GLOVE BIO SURGEONS STRL SZ 6.5 (GLOVE) ×1
GLOVE BIOGEL PI IND STRL 6 (GLOVE) ×1 IMPLANT
GLOVE BIOGEL PI INDICATOR 6 (GLOVE) ×2
GOWN STRL REUS W/ TWL LRG LVL3 (GOWN DISPOSABLE) ×3 IMPLANT
GOWN STRL REUS W/TWL LRG LVL3 (GOWN DISPOSABLE) ×6
HEMOSTAT SNOW SURGICEL 2X4 (HEMOSTASIS) ×6 IMPLANT
KIT BASIN OR (CUSTOM PROCEDURE TRAY) ×3 IMPLANT
KIT IMAGING PINPOINTPAQ (MISCELLANEOUS) ×3 IMPLANT
KIT TURNOVER KIT B (KITS) ×3 IMPLANT
NS IRRIG 1000ML POUR BTL (IV SOLUTION) ×3 IMPLANT
PAD ARMBOARD 7.5X6 YLW CONV (MISCELLANEOUS) ×3 IMPLANT
POUCH RETRIEVAL ECOSAC 10 (ENDOMECHANICALS) ×1 IMPLANT
POUCH RETRIEVAL ECOSAC 10MM (ENDOMECHANICALS) ×2
POUCH SPECIMEN RETRIEVAL 10MM (ENDOMECHANICALS) IMPLANT
SCISSORS LAP 5X35 DISP (ENDOMECHANICALS) ×3 IMPLANT
SET CHOLANGIOGRAPH 5 50 .035 (SET/KITS/TRAYS/PACK) IMPLANT
SET IRRIG TUBING LAPAROSCOPIC (IRRIGATION / IRRIGATOR) ×3 IMPLANT
SET TUBE SMOKE EVAC HIGH FLOW (TUBING) ×3 IMPLANT
SLEEVE ENDOPATH XCEL 5M (ENDOMECHANICALS) ×6 IMPLANT
SPECIMEN JAR SMALL (MISCELLANEOUS) ×3 IMPLANT
SUT ETHILON 2 0 FS 18 (SUTURE) ×3 IMPLANT
SUT MNCRL AB 4-0 PS2 18 (SUTURE) ×6 IMPLANT
SUT VICRYL 0 UR6 27IN ABS (SUTURE) IMPLANT
TOWEL GREEN STERILE (TOWEL DISPOSABLE) ×3 IMPLANT
TOWEL GREEN STERILE FF (TOWEL DISPOSABLE) ×3 IMPLANT
TRAY LAPAROSCOPIC MC (CUSTOM PROCEDURE TRAY) ×3 IMPLANT
TROCAR XCEL BLUNT TIP 100MML (ENDOMECHANICALS) ×6 IMPLANT
TROCAR XCEL NON-BLD 5MMX100MML (ENDOMECHANICALS) ×3 IMPLANT
WATER STERILE IRR 1000ML POUR (IV SOLUTION) ×3 IMPLANT

## 2020-01-24 NOTE — Anesthesia Procedure Notes (Signed)
Procedure Name: Intubation Date/Time: 01/24/2020 2:14 PM Performed by: Kathryne Hitch, CRNA Pre-anesthesia Checklist: Patient identified, Emergency Drugs available, Suction available and Patient being monitored Patient Re-evaluated:Patient Re-evaluated prior to induction Oxygen Delivery Method: Circle system utilized Preoxygenation: Pre-oxygenation with 100% oxygen Induction Type: IV induction Ventilation: Mask ventilation without difficulty Laryngoscope Size: Miller and 3 Grade View: Grade I Tube type: Oral Tube size: 7.5 mm Number of attempts: 1 Airway Equipment and Method: Stylet and Oral airway Placement Confirmation: ETT inserted through vocal cords under direct vision,  positive ETCO2 and breath sounds checked- equal and bilateral Secured at: 23 cm Tube secured with: Tape Dental Injury: Teeth and Oropharynx as per pre-operative assessment

## 2020-01-24 NOTE — Anesthesia Postprocedure Evaluation (Signed)
Anesthesia Post Note  Patient: Adam Fowler  Procedure(s) Performed: LAPAROSCOPIC CHOLECYSTECTOMY (N/A Abdomen)     Patient location during evaluation: PACU Anesthesia Type: General Level of consciousness: awake Pain management: pain level controlled Vital Signs Assessment: post-procedure vital signs reviewed and stable Respiratory status: spontaneous breathing, nonlabored ventilation, respiratory function stable and patient connected to nasal cannula oxygen Cardiovascular status: blood pressure returned to baseline and stable Postop Assessment: no apparent nausea or vomiting Anesthetic complications: no    Last Vitals:  Vitals:   01/24/20 1642 01/24/20 1700  BP: 134/72 139/71  Pulse: 65 66  Resp: (!) 8 15  Temp: 36.8 C 36.9 C  SpO2: 93% 96%    Last Pain:  Vitals:   01/24/20 1730  TempSrc:   PainSc: Asleep                 Meta Kroenke P Federico Maiorino

## 2020-01-24 NOTE — Anesthesia Preprocedure Evaluation (Addendum)
Anesthesia Evaluation  Patient identified by MRN, date of birth, ID band Patient awake    Reviewed: Allergy & Precautions, NPO status , Patient's Chart, lab work & pertinent test results  History of Anesthesia Complications Negative for: history of anesthetic complications  Airway Mallampati: II  TM Distance: >3 FB Neck ROM: Full    Dental  (+) Dental Advisory Given   Pulmonary neg pulmonary ROS,    breath sounds clear to auscultation       Cardiovascular hypertension, Pt. on medications (-) angina Rhythm:Regular Rate:Normal     Neuro/Psych negative neurological ROS     GI/Hepatic Elevated LFTs No N/V or abd pain s/p ERCP   Endo/Other  negative endocrine ROS  Renal/GU negative Renal ROS     Musculoskeletal   Abdominal   Peds  Hematology negative hematology ROS (+)   Anesthesia Other Findings   Reproductive/Obstetrics                            Anesthesia Physical Anesthesia Plan  ASA: II  Anesthesia Plan: General   Post-op Pain Management:    Induction: Intravenous  PONV Risk Score and Plan: 2 and Ondansetron and Dexamethasone  Airway Management Planned: Oral ETT  Additional Equipment:   Intra-op Plan:   Post-operative Plan: Extubation in OR  Informed Consent: I have reviewed the patients History and Physical, chart, labs and discussed the procedure including the risks, benefits and alternatives for the proposed anesthesia with the patient or authorized representative who has indicated his/her understanding and acceptance.     Dental advisory given  Plan Discussed with: CRNA and Surgeon  Anesthesia Plan Comments:         Anesthesia Quick Evaluation

## 2020-01-24 NOTE — Progress Notes (Signed)
Patient going to OR. Short stay was called for report. VS stable, no acute distress noted, no complaints.

## 2020-01-24 NOTE — Progress Notes (Signed)
PROGRESS NOTE        PATIENT DETAILS Name: Adam Fowler Age: 64 y.o. Sex: male Date of Birth: 1955-10-31 Admit Date: 01/21/2020 Admitting Physician Cheryll Dessert, MD [id number], Zannie Cove, MD  Brief Narrative: Patient is a 64 y.o. male with history of HTN, HLD-who presented as a transfer from [facility] health for evaluation of gallstone pancreatitis, E. coli bacteremia, AKI and septic shock.  He was managed in the ICU-briefly required pressors-stabilized-and subsequently transferred to the [facility].  Significant events: 5/15>> transfer from [place] [facility] to [facility] ICU for septic shock/AKI/gallstone pancreatitis 5/16>> transfer to [facility]  Antimicrobial therapy: Unasyn: 5/15>>  Microbiology data: 5/14: Blood cultures at [facility] health>> E. Coli  Significant studies: 5/14>> HIDA scan: Nonvisualization of the gallbladder after 2 hours 5/14>> chest x-ray: No active disease 5/14>> CT abdomen/pelvis:signs of acute cholecystitis with acute interstitial pancreatitis, bilateral nephrolithiasis, diverticulosis  Procedures : 5/14>> unsuccessful ERCP-unable to cannulate CBD-pancreatic duct stent placed. 5/17>>ERCP: Able to cannulate pancreatic duct-no debris/purulence found-pancreatic duct stent placed.  Consults: GI, PCCM, general surgery  DVT Prophylaxis : SCD's  Subjective: Feels better-hardly any abdominal pain.  No nausea or vomiting.  Assessment/Plan: Gallstone pancreatitis with obstructive jaundice: Significantly better s/p ERCP on 5/17-LFTs decreasing.  Appreciate GI input-General surgery consulted for laparoscopic cholecystectomy.  AKI: Likely hemodynamically mediated in setting of gallstone pancreatitis/bacteremia.  Renal failure has resolved-continue to follow periodically.  Septic shock with E. coli bacteremia (present on admission): Sepsis physiology has resolved-briefly required pressors on admission-continue Unasyn-we will need  to get official results from [employer].  HTN: BP relatively controlled without the use of any antihypertensives.  HLD: Continue to hold statin.  Diet: Diet Order            Diet NPO time specified  Diet effective midnight               Code Status: Full code  Family Communication: Offered to call family-however patient prefers to update family himself.  Disposition Plan: Status is: Inpatient  Remains inpatient appropriate because:Inpatient level of care appropriate due to severity of illness  Dispo: The patient is from: Home              Anticipated d/c is to: Home              Anticipated d/c date is: 2 days              Patient currently is not medically stable to d/c.  Barriers to Discharge: Gallstone pancreatitis with E. coli bacteremia-on IV antibiotics-likely will need laparoscopic cholecystectomy prior to discharge.  Antimicrobial agents: Anti-infectives (From admission, onward)   Start     Dose/Rate Route Frequency Ordered Stop   01/21/20 1500  Ampicillin-Sulbactam (UNASYN) 3 g in sodium chloride 0.9 % 100 mL IVPB     3 g 200 mL/hr over 30 Minutes Intravenous Every 6 hours 01/21/20 0845     01/21/20 0600  piperacillin-tazobactam (ZOSYN) IVPB 3.375 g  Status:  Discontinued     3.375 g 12.5 mL/hr over 240 Minutes Intravenous Every 8 hours 01/21/20 0454 01/21/20 0845       Time spent: 25 minutes-Greater than 50% of this time was spent in counseling, explanation of diagnosis, planning of further management, and coordination of care.  MEDICATIONS: Scheduled Meds: . indomethacin  100 mg Rectal Once  . indomethacin  100  mg Rectal Once  . sodium chloride flush  10-40 mL Intracatheter Q12H   Continuous Infusions: . ampicillin-sulbactam (UNASYN) IV 3 g (01/24/20 0857)  . dextrose 5% lactated ringers 75 mL/hr at 01/24/20 0724   PRN Meds:.morphine injection, ondansetron (ZOFRAN) IV, sodium chloride flush, zolpidem   PHYSICAL EXAM: Vital  signs: Vitals:   01/23/20 1703 01/23/20 2137 01/24/20 0413 01/24/20 0529  BP: (!) 157/92 (!) 149/78  (!) 154/89  Pulse: 60 65  74  Resp: 19 18  20   Temp: 98.6 F (37 C) 98.2 F (36.8 C)  98.2 F (36.8 C)  TempSrc: Oral Oral  Oral  SpO2: 91% 95%  98%  Weight:   90.5 kg   Height:       Filed Weights   01/22/20 0500 01/23/20 0500 01/24/20 0413  Weight: 88.4 kg 90.3 kg 90.5 kg   Body mass index is 29.46 kg/m.   Gen Exam:Alert awake-not in any distress HEENT:atraumatic, normocephalic Chest: B/L clear to auscultation anteriorly CVS:S1S2 regular Abdomen:soft non tender, non distended Extremities:no edema Neurology: Non focal Skin: no rash  I have personally reviewed following labs and imaging studies  LABORATORY DATA: CBC: Recent Labs  Lab 01/21/20 0426 01/22/20 0528 01/23/20 0438 01/24/20 0416  WBC 16.8* 11.9* 12.2* 11.4*  NEUTROABS  --  10.1* 9.9* 9.3*  HGB 12.9* 12.7* 13.1 13.5  HCT 37.7* 37.3* 37.2* 39.5  MCV 90.4 90.3 88.8 90.4  PLT 172 171 176 [id number]    Basic Metabolic Panel: Recent Labs  Lab 01/21/20 0426 01/22/20 0528 01/23/20 0438 01/24/20 0416  NA 137 137 134* 137  K 3.7 3.1* 2.8* 3.9  CL 100 99 101 102  CO2 25 27 24 25   GLUCOSE 114* 136* 138* 149*  BUN 41* 29* 16 20  CREATININE 2.22* 1.60* 1.09 1.16  CALCIUM 7.9* 8.2* 7.8* 8.2*  MG 1.8  --   --   --   PHOS 2.8  --   --   --     GFR: Estimated Creatinine Clearance: 72.5 mL/min (by C-G formula based on SCr of 1.16 mg/dL).  Liver Function Tests: Recent Labs  Lab 01/21/20 0426 01/22/20 0528 01/23/20 0438 01/24/20 0416  AST 140* 136* 195* 135*  ALT 128* 125* 163* 173*  ALKPHOS 147* 181* 265* 274*  BILITOT 6.7* 5.3* 6.3* 3.9*  PROT 5.4* 5.3* 5.3* 5.4*  ALBUMIN 2.3* 2.2* 2.0* 2.1*   Recent Labs  Lab 01/21/20 0426 01/22/20 0528 01/24/20 0416  LIPASE 785* 651* 84*   No results for input(s): AMMONIA in the last 168 hours.  Coagulation Profile: No results for input(s): INR, PROTIME  in the last 168 hours.  Cardiac Enzymes: No results for input(s): CKTOTAL, CKMB, CKMBINDEX, TROPONINI in the last 168 hours.  BNP (last 3 results) No results for input(s): PROBNP in the last 8760 hours.  Lipid Profile: No results for input(s): CHOL, HDL, LDLCALC, TRIG, CHOLHDL, LDLDIRECT in the last 72 hours.  Thyroid Function Tests: No results for input(s): TSH, T4TOTAL, FREET4, T3FREE, THYROIDAB in the last 72 hours.  Anemia Panel: No results for input(s): VITAMINB12, FOLATE, FERRITIN, TIBC, IRON, RETICCTPCT in the last 72 hours.  Urine analysis: No results found for: COLORURINE, APPEARANCEUR, LABSPEC, PHURINE, GLUCOSEU, HGBUR, BILIRUBINUR, KETONESUR, PROTEINUR, UROBILINOGEN, NITRITE, LEUKOCYTESUR  Sepsis Labs: Lactic Acid, Venous No results found for: LATICACIDVEN  MICROBIOLOGY: Recent Results (from the past 240 hour(s))  MRSA PCR Screening     Status: None   Collection Time: 01/21/20  3:29 AM   Specimen: Nasopharyngeal  Result Value Ref Range Status   MRSA by PCR NEGATIVE NEGATIVE Final    Comment:        The GeneXpert MRSA Assay (FDA approved for NASAL specimens only), is one component of a comprehensive MRSA colonization surveillance program. It is not intended to diagnose MRSA infection nor to guide or monitor treatment for MRSA infections. Performed at [facility], [place] [address], [place], [place] [address]   Surgical PCR screen     Status: None   Collection Time: 01/24/20  7:40 AM   Specimen: Nasal Mucosa; Nasal Swab  Result Value Ref Range Status   MRSA, PCR NEGATIVE NEGATIVE Final   Staphylococcus aureus NEGATIVE NEGATIVE Final    Comment: (NOTE) The Xpert SA Assay (FDA approved for NASAL specimens in patients 56 years of age and older), is one component of a comprehensive surveillance program. It is not intended to diagnose infection nor to guide or monitor treatment. Performed at [facility], [place] [address], [place],  [place] [address]     RADIOLOGY STUDIES/RESULTS: DG ERCP BILIARY & PANCREATIC DUCTS  Result Date: 01/23/2020 CLINICAL DATA:  64 year old male with a history of biliary ductal obstruction EXAM: ERCP TECHNIQUE: Multiple spot images obtained with the fluoroscopic device and submitted for interpretation post-procedure. FLUOROSCOPY TIME:  Fluoroscopy Time:  4 minutes 44 seconds COMPARISON:  Prior ERCP 01/21/2020 FINDINGS: A total of 6 intraoperative spot images and a cine clips are submitted for review. The images demonstrate first cannulation of the main pancreatic duct with placement of a small pancreatic stent. Subsequently, the common bile duct is cannulated. The common bile duct is then balloon swept IMPRESSION: 1. Placement of a plastic pancreatic duct stent. 2. ERCP with balloon sweeping of the common bile duct. These images were submitted for radiologic interpretation only. Please see the procedural report for the amount of contrast and the fluoroscopy time utilized. Electronically Signed   By: Jacqulynn Cadet M.D.   On: 01/23/2020 16:37     LOS: 3 days   Oren Binet, MD  [facility]    To contact the attending provider between 7A-7P or the covering provider during after hours 7P-7A, please log into the web site [email or web address] and access using universal [place] password for that web site. If you do not have the password, please call the hospital operator.  01/24/2020, 12:06 PM

## 2020-01-24 NOTE — Transfer of Care (Signed)
Immediate Anesthesia Transfer of Care Note  Patient: Adam Fowler  Procedure(s) Performed: LAPAROSCOPIC CHOLECYSTECTOMY (N/A Abdomen)  Patient Location: PACU  Anesthesia Type:General  Level of Consciousness: drowsy and patient cooperative  Airway & Oxygen Therapy: Patient Spontanous Breathing and Patient connected to face mask oxygen  Post-op Assessment: Report given to RN and Post -op Vital signs reviewed and stable  Post vital signs: Reviewed and stable  Last Vitals:  Vitals Value Taken Time  BP 127/82 01/24/20 1559  Temp    Pulse 70 01/24/20 1601  Resp 19 01/24/20 1601  SpO2 96 % 01/24/20 1601  Vitals shown include unvalidated device data.  Last Pain:  Vitals:   01/24/20 1204  TempSrc: Oral  PainSc:       Patients Stated Pain Goal: 3 ([id number])  Complications: No apparent anesthesia complications

## 2020-01-24 NOTE — Consult Note (Addendum)
[facility] Surgery Consult Note  Adam Fowler 1956-02-19  [id number].    Requesting MD: Sloan Leiter, MD Chief Complaint/Reason for Consult: gallstone pancreatitis  HPI:  64 y/o M with a PMH HTN, HLD who presented to [place] 5/14 with acute upper abdominal pain, nausea, and emesis. Reports a history of intermittent upper abdominal pain (usually LUQ), with associated belching, not associated with food. He came to ED due to increase in severity of pain and intolerance of PO intake for 1-2 weeks. On arrival he was febrile, tachycardic, with leukocytosis 19 and elevated LFTs and lipase (lipase >30,000, t.bili 4.7). Per chart review, CT performed at [facility] revealed pancreatitis, possible cholecystitis, and blood cultures positive for E.coli bacteremia. HIDA scan showed a non-filling gallbladder after 2 hours (aborted early). He was in septic shock requiring levophed and was transferred to [facility] 5/15 for a higher level of care. He underwent successful ERCP with re-placement of pancreatic stent yesterday and CBD sphicterotomy. No opacification of the cystic duct or gallbladder was noted on occlusion cholangiogram. CCS is consulted for possible cholecystectomy.  Patient denies a history of abdominal surgery. Denies alcohol, tobacco, drug use. Denies use of blood thinning medications. Lives with his wife.   ROS: Review of Systems  Constitutional: Positive for chills and fever.  HENT: Negative.   Eyes: Negative.   Respiratory: Negative.   Cardiovascular: Negative.   Gastrointestinal: Positive for abdominal pain, diarrhea, nausea and vomiting. Negative for blood in stool, constipation and melena.  Genitourinary: Negative.   Musculoskeletal: Positive for back pain. Negative for falls, joint pain, myalgias and neck pain.  Skin: Negative.   Neurological: Negative.   Endo/Heme/Allergies: Negative.   Psychiatric/Behavioral: Negative.     Family History  Problem Relation Age of Onset  . Colon  cancer Neg Hx   . Stomach cancer Neg Hx     Past Medical History:  Diagnosis Date  . Hypertension   . Insomnia   . Kidney stones     Past Surgical History:  Procedure Laterality Date  . ARTHROSCOPIC REPAIR ACL     left, 2012; right 2004  . ERCP N/A 01/21/2020   Procedure: ENDOSCOPIC RETROGRADE CHOLANGIOPANCREATOGRAPHY (ERCP);  Surgeon: Ladene Artist, MD;  Location: [facility] ENDOSCOPY;  Service: Endoscopy;  Laterality: N/A;  . PANCREATIC STENT PLACEMENT  01/21/2020   Procedure: PANCREATIC STENT PLACEMENT;  Surgeon: Ladene Artist, MD;  Location: [facility] ENDOSCOPY;  Service: Endoscopy;;    Social History:  reports that he has never smoked. He has never used smokeless tobacco. He reports current alcohol use of about 2.0 standard drinks of alcohol per week. He reports that he does not use drugs.  Allergies: No Known Allergies  Medications Prior to Admission  Medication Sig Dispense Refill  . aspirin EC 81 MG tablet Take 81 mg by mouth 2 (two) times daily.    . Calcium Carb-Cholecalciferol (CALCIUM 1000 + D PO) Take 1 tablet by mouth daily.    . ciprofloxacin (CIPRO) 500 MG tablet Take 500 mg by mouth 2 (two) times daily.    . Coenzyme Q10 (COQ10) 400 MG CAPS Take 800 mg by mouth daily.    . Cyanocobalamin (VITAMIN B-12) 5000 MCG TBDP Take 5,000 mcg by mouth daily.    Marland Kitchen dicyclomine (BENTYL) 10 MG capsule Take 10 mg by mouth every 6 (six) hours as needed for spasms.     Marland Kitchen GLUCOSAMINE-CHONDROITIN PO Take 1 tablet by mouth 2 (two) times daily.    Marland Kitchen ibuprofen (ADVIL) 200 MG tablet Take 600  mg by mouth 2 (two) times daily.    Javier Docker Oil 1000 MG CAPS Take 1,000-2,000 mg by mouth See admin instructions. Take one capsule (1000 mg) by mouth every morning and two capsules (2000 mg) at bedtime    . lisinopril-hydrochlorothiazide (ZESTORETIC) 10-12.5 MG tablet Take 1 tablet by mouth daily.    Edyth Gunnels Oleifera (MORINGA PO) Take 1,000 mg by mouth 2 (two) times daily.    . Multiple Vitamin (MULTIVITAMIN  WITH MINERALS) TABS tablet Take 1 tablet by mouth daily.    . pravastatin (PRAVACHOL) 80 MG tablet Take 40 mg by mouth 2 (two) times daily.    . Red Yeast Rice 600 MG CAPS Take 1,800 mg by mouth at bedtime.    . sertraline (ZOLOFT) 100 MG tablet Take 100 mg by mouth daily.    . TURMERIC PO Take 1,500 mg by mouth 2 (two) times daily.    Marland Kitchen zolpidem (AMBIEN) 5 MG tablet Take 5 mg by mouth at bedtime.    . sertraline (ZOLOFT) 50 MG tablet Take 50 mg by mouth daily.        Blood pressure (!) 154/89, pulse 74, temperature 98.2 F (36.8 C), temperature source Oral, resp. rate 20, height 5\' 9"  (1.753 m), weight 90.5 kg, SpO2 98 %. Physical Exam: Constitutional: NAD; conversant; no deformities Eyes: Moist conjunctiva; no lid lag; anicteric; PERRL Neck: Trachea midline; no thyromegaly Lungs: Normal respiratory effort; no tactile fremitus; CTAB CV: RRR; no palpable thrills; no pitting edema GI: Abd soft, non-tender, no palpable hepatosplenomegaly MSK: Normal gait; no clubbing/cyanosis Psychiatric: Appropriate affect; alert and oriented x3 Lymphatic: No palpable cervical or axillary lymphadenopathy  Results for orders placed or performed during the hospital encounter of 01/21/20 (from the past 48 hour(s))  Glucose, capillary     Status: Abnormal   Collection Time: 01/22/20 12:00 PM  Result Value Ref Range   Glucose-Capillary 121 (H) 70 - 99 mg/dL    Comment: Glucose reference range applies only to samples taken after fasting for at least 8 hours.  Glucose, capillary     Status: Abnormal   Collection Time: 01/22/20  5:42 PM  Result Value Ref Range   Glucose-Capillary 131 (H) 70 - 99 mg/dL    Comment: Glucose reference range applies only to samples taken after fasting for at least 8 hours.  Glucose, capillary     Status: Abnormal   Collection Time: 01/22/20 11:22 PM  Result Value Ref Range   Glucose-Capillary 145 (H) 70 - 99 mg/dL    Comment: Glucose reference range applies only to samples  taken after fasting for at least 8 hours.  Comprehensive metabolic panel     Status: Abnormal   Collection Time: 01/23/20  4:38 AM  Result Value Ref Range   Sodium 134 (L) 135 - 145 mmol/L   Potassium 2.8 (L) 3.5 - 5.1 mmol/L   Chloride 101 98 - 111 mmol/L   CO2 24 22 - 32 mmol/L   Glucose, Bld 138 (H) 70 - 99 mg/dL    Comment: Glucose reference range applies only to samples taken after fasting for at least 8 hours.   BUN 16 8 - 23 mg/dL   Creatinine, Ser 1.09 0.61 - 1.24 mg/dL   Calcium 7.8 (L) 8.9 - 10.3 mg/dL   Total Protein 5.3 (L) 6.5 - 8.1 g/dL   Albumin 2.0 (L) 3.5 - 5.0 g/dL   AST 195 (H) 15 - 41 U/L   ALT 163 (H) 0 - 44 U/L  Alkaline Phosphatase 265 (H) 38 - 126 U/L   Total Bilirubin 6.3 (H) 0.3 - 1.2 mg/dL   GFR calc non Af Amer >60 >60 mL/min   GFR calc Af Amer >60 >60 mL/min   Anion gap 9 5 - 15    Comment: Performed at [facility] [address] [place], [place] [address]  CBC with Differential/Platelet     Status: Abnormal   Collection Time: 01/23/20  4:38 AM  Result Value Ref Range   WBC 12.2 (H) 4.0 - 10.5 K/uL   RBC 4.19 (L) 4.22 - 5.81 MIL/uL   Hemoglobin 13.1 13.0 - 17.0 g/dL   HCT 37.2 (L) 39.0 - 52.0 %   MCV 88.8 80.0 - 100.0 fL   MCH 31.3 26.0 - 34.0 pg   MCHC 35.2 30.0 - 36.0 g/dL   RDW 12.6 11.5 - 15.5 %   Platelets 176 150 - 400 K/uL   nRBC 0.0 0.0 - 0.2 %   Neutrophils Relative % 81 %   Neutro Abs 9.9 (H) 1.7 - 7.7 K/uL   Lymphocytes Relative 7 %   Lymphs Abs 0.9 0.7 - 4.0 K/uL   Monocytes Relative 8 %   Monocytes Absolute 1.0 0.1 - 1.0 K/uL   Eosinophils Relative 1 %   Eosinophils Absolute 0.1 0.0 - 0.5 K/uL   Basophils Relative 0 %   Basophils Absolute 0.0 0.0 - 0.1 K/uL   Immature Granulocytes 3 %   Abs Immature Granulocytes 0.32 (H) 0.00 - 0.07 K/uL    Comment: Performed at [facility] [address], [place], [place] [address]  Glucose, capillary     Status: Abnormal   Collection Time: 01/23/20  5:21 AM  Result Value  Ref Range   Glucose-Capillary 118 (H) 70 - 99 mg/dL    Comment: Glucose reference range applies only to samples taken after fasting for at least 8 hours.  Glucose, capillary     Status: Abnormal   Collection Time: 01/23/20 11:55 AM  Result Value Ref Range   Glucose-Capillary 121 (H) 70 - 99 mg/dL    Comment: Glucose reference range applies only to samples taken after fasting for at least 8 hours.  Glucose, capillary     Status: Abnormal   Collection Time: 01/23/20  4:59 PM  Result Value Ref Range   Glucose-Capillary 166 (H) 70 - 99 mg/dL    Comment: Glucose reference range applies only to samples taken after fasting for at least 8 hours.  Glucose, capillary     Status: Abnormal   Collection Time: 01/23/20 11:32 PM  Result Value Ref Range   Glucose-Capillary 156 (H) 70 - 99 mg/dL    Comment: Glucose reference range applies only to samples taken after fasting for at least 8 hours.  Comprehensive metabolic panel     Status: Abnormal   Collection Time: 01/24/20  4:16 AM  Result Value Ref Range   Sodium 137 135 - 145 mmol/L   Potassium 3.9 3.5 - 5.1 mmol/L   Chloride 102 98 - 111 mmol/L   CO2 25 22 - 32 mmol/L   Glucose, Bld 149 (H) 70 - 99 mg/dL    Comment: Glucose reference range applies only to samples taken after fasting for at least 8 hours.   BUN 20 8 - 23 mg/dL   Creatinine, Ser 1.16 0.61 - 1.24 mg/dL   Calcium 8.2 (L) 8.9 - 10.3 mg/dL   Total Protein 5.4 (L) 6.5 - 8.1 g/dL  Albumin 2.1 (L) 3.5 - 5.0 g/dL   AST 135 (H) 15 - 41 U/L   ALT 173 (H) 0 - 44 U/L   Alkaline Phosphatase 274 (H) 38 - 126 U/L   Total Bilirubin 3.9 (H) 0.3 - 1.2 mg/dL   GFR calc non Af Amer >60 >60 mL/min   GFR calc Af Amer >60 >60 mL/min   Anion gap 10 5 - 15    Comment: Performed at [facility] [address], [place], [place] [address]  CBC with Differential/Platelet     Status: Abnormal   Collection Time: 01/24/20  4:16 AM  Result Value Ref Range   WBC 11.4 (H) 4.0 - 10.5 K/uL   RBC  4.37 4.22 - 5.81 MIL/uL   Hemoglobin 13.5 13.0 - 17.0 g/dL   HCT 39.5 39.0 - 52.0 %   MCV 90.4 80.0 - 100.0 fL   MCH 30.9 26.0 - 34.0 pg   MCHC 34.2 30.0 - 36.0 g/dL   RDW 12.8 11.5 - 15.5 %   Platelets 194 150 - 400 K/uL   nRBC 0.0 0.0 - 0.2 %   Neutrophils Relative % 81 %   Neutro Abs 9.3 (H) 1.7 - 7.7 K/uL   Lymphocytes Relative 8 %   Lymphs Abs 0.9 0.7 - 4.0 K/uL   Monocytes Relative 5 %   Monocytes Absolute 0.6 0.1 - 1.0 K/uL   Eosinophils Relative 0 %   Eosinophils Absolute 0.0 0.0 - 0.5 K/uL   Basophils Relative 1 %   Basophils Absolute 0.1 0.0 - 0.1 K/uL   Immature Granulocytes 5 %   Abs Immature Granulocytes 0.51 (H) 0.00 - 0.07 K/uL    Comment: Performed at [facility] [address], [place], [place] [address]  Lipase, blood     Status: Abnormal   Collection Time: 01/24/20  4:16 AM  Result Value Ref Range   Lipase 84 (H) 11 - 51 U/L    Comment: Performed at [facility] [address] [place], [place] [address]  Glucose, capillary     Status: Abnormal   Collection Time: 01/24/20  5:27 AM  Result Value Ref Range   Glucose-Capillary 150 (H) 70 - 99 mg/dL    Comment: Glucose reference range applies only to samples taken after fasting for at least 8 hours.   DG ERCP BILIARY & PANCREATIC DUCTS  Result Date: 01/23/2020 CLINICAL DATA:  64 year old male with a history of biliary ductal obstruction EXAM: ERCP TECHNIQUE: Multiple spot images obtained with the fluoroscopic device and submitted for interpretation post-procedure. FLUOROSCOPY TIME:  Fluoroscopy Time:  4 minutes 44 seconds COMPARISON:  Prior ERCP 01/21/2020 FINDINGS: A total of 6 intraoperative spot images and a cine clips are submitted for review. The images demonstrate first cannulation of the main pancreatic duct with placement of a small pancreatic stent. Subsequently, the common bile duct is cannulated. The common bile duct is then balloon swept IMPRESSION: 1. Placement of a plastic pancreatic duct  stent. 2. ERCP with balloon sweeping of the common bile duct. These images were submitted for radiologic interpretation only. Please see the procedural report for the amount of contrast and the fluoroscopy time utilized. Electronically Signed   By: Jacqulynn Cadet M.D.   On: 01/23/2020 16:37   Assessment/Plan HTN HLD  Gallstone pancreatitis  Acute calculous cholecystitis  WBC 11.4  AST 135, ALT 173, AP 274, t.bili 3.9 Lipase 84 Abdominal exam benign - isolated episode of RUQ tenderness to deep  palpation that I was not able to reproduce.   FEN: NPO ID: Unasyn VTE: SCD's, chemical VTE held per GI due to ERCP w/ sphincterotomy yesterday 5/17  Plan: Will discuss laparoscopic cholecystectomy with MD. Today vs tomorrow.  Jill Alexanders, [facility] Surgery 01/24/2020, 9:43 AM

## 2020-01-24 NOTE — Progress Notes (Addendum)
Daily Rounding Note  01/24/2020, 10:48 AM  LOS: 3 days   SUBJECTIVE:   Chief complaint:  Jaundice, pancreatitis, cholecystitis   No abd pain or n/v.  Tolerated clears last PM, NPO this AM in case surgery want to take him for lap chole.   Seen by surgical PA but consult not yet finalized.    OBJECTIVE:         Vital signs in last 24 hours:    Temp:  [98.2 F (36.8 C)-100.3 F (37.9 C)] 98.2 F (36.8 C) (05/18 0529) Pulse Rate:  [60-74] 74 (05/18 0529) Resp:  [15-20] 20 (05/18 0529) BP: (135-179)/(69-94) 154/89 (05/18 0529) SpO2:  [91 %-98 %] 98 % (05/18 0529) Weight:  [90.5 kg] 90.5 kg (05/18 0413) Last BM Date: 01/22/20 Filed Weights   01/22/20 0500 01/23/20 0500 01/24/20 0413  Weight: 88.4 kg 90.3 kg 90.5 kg   General: slight icterus   Heart: RRR Chest: clear bil.  No cough or dyspnea Abdomen: soft, NT, ND.  Active BS  Extremities: no CCE Neuro/Psych:  Alert, animated affect, fluid speech.  No weakness.   Lab Results: Recent Labs    01/22/20 0528 01/23/20 0438 01/24/20 0416  WBC 11.9* 12.2* 11.4*  HGB 12.7* 13.1 13.5  HCT 37.3* 37.2* 39.5  PLT 171 176 194   BMET Recent Labs    01/22/20 0528 01/23/20 0438 01/24/20 0416  NA 137 134* 137  K 3.1* 2.8* 3.9  CL 99 101 102  CO2 27 24 25   GLUCOSE 136* 138* 149*  BUN 29* 16 20  CREATININE 1.60* 1.09 1.16  CALCIUM 8.2* 7.8* 8.2*   LFT Recent Labs    01/22/20 0528 01/23/20 0438 01/24/20 0416  PROT 5.3* 5.3* 5.4*  ALBUMIN 2.2* 2.0* 2.1*  AST 136* 195* 135*  ALT 125* 163* 173*  ALKPHOS 181* 265* 274*  BILITOT 5.3* 6.3* 3.9*    Studies/Results: DG ERCP BILIARY & PANCREATIC DUCTS  Result Date: 01/23/2020 CLINICAL DATA:  64 year old male with a history of biliary ductal obstruction EXAM: ERCP TECHNIQUE: Multiple spot images obtained with the fluoroscopic device and submitted for interpretation post-procedure. FLUOROSCOPY TIME:  Fluoroscopy  Time:  4 minutes 44 seconds COMPARISON:  Prior ERCP 01/21/2020 FINDINGS: A total of 6 intraoperative spot images and a cine clips are submitted for review. The images demonstrate first cannulation of the main pancreatic duct with placement of a small pancreatic stent. Subsequently, the common bile duct is cannulated. The common bile duct is then balloon swept IMPRESSION: 1. Placement of a plastic pancreatic duct stent. 2. ERCP with balloon sweeping of the common bile duct. These images were submitted for radiologic interpretation only. Please see the procedural report for the amount of contrast and the fluoroscopy time utilized. Electronically Signed   By: Jacqulynn Cadet M.D.   On: 01/23/2020 16:37   Scheduled Meds: . indomethacin  100 mg Rectal Once  . indomethacin  100 mg Rectal Once  . sodium chloride flush  10-40 mL Intracatheter Q12H   Continuous Infusions: . ampicillin-sulbactam (UNASYN) IV 3 g (01/24/20 0857)  . dextrose 5% lactated ringers 75 mL/hr at 01/24/20 0724   PRN Meds:.morphine injection, ondansetron (ZOFRAN) IV, sodium chloride flush, zolpidem   ASSESMENT:   *   Biliary pancreatitis.  Jaundice.  Fevers .  Abdominal pain, nausea, vomiting.  Suspected cholangitis w sepsis.   01/20/2020 [facility]: Acute cholecystitis, acute pancreatitis without necrosis or fluid collections.  No ductal  dilatation. 01/20/2020 HIDA scan: Nonvisualization of gallbladder but study terminated early.. 01/21/2020 ERCP: Unsuccessful.  Unable to cannulate CBD, normal partial pancreatogram.  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  Pancreatic stent had migrated.  Difficult cannulation with wire entering pancreatic duct on multiple occasions.  A temporary plastic stent placed into PD.  Then MD able to cannulate the CBD.  Sphincterotomy performed, no debris/stones/purulence found on balloon sweep.  No  opacification of cystic duct or gallbladder on occlusion cholangiogram. Day 5 Abx, 1 d Zosyn >> 4 d Unasyn.   Lipase 785 >> 84.  LFTs also improved last 24 hours.  Leukocytosis improved.     PLAN   *   Surgery following and plans TBD.  If no surgery today, can eat low fat diet.       Azucena Freed  01/24/2020, 10:48 AM Phone [phone]   ________________________________________________________________________  [facility] Heckler GI MD note:  I personally examined the patient, reviewed the data and agree with the assessment and plan described above.  Repeat ERCP yesterday and there were no stones in his bile duct.  LFTs a bit improved today.  Surgery considering cholecystectomy  For gallstone pancreatitis. I think that is probably the best next step.  If IOC shows CBD pathology please let me know.  Please call or page with any further questions or concerns.  Owens Loffler, MD [facility] Gastroenterology Pager [phone]

## 2020-01-24 NOTE — Progress Notes (Signed)
Patient back from OR. Alert and oriented x 3, complaining of abdominal pain 10/10. Closed System drain on right side abdomen and 4 ports. VS stable. Will continue to monitor.

## 2020-01-24 NOTE — Telephone Encounter (Signed)
03/06/20 at 130 pm appt made with Dr Rush Landmark and labs and xray entered.  Pt currently admitted and will be notified at discharge

## 2020-01-24 NOTE — Discharge Instructions (Signed)
[facility] ______[facility] LAPAROSCOPIC SURGERY: POST OP INSTRUCTIONS Always review your discharge instruction sheet given to you by the facility where your surgery was performed. IF YOU HAVE DISABILITY OR FAMILY LEAVE FORMS, YOU MUST BRING THEM TO THE OFFICE FOR PROCESSING.   DO NOT GIVE THEM TO YOUR DOCTOR.  1. A prescription for pain medication may be given to you upon discharge.  Take your pain medication as prescribed, if needed.  If narcotic pain medicine is not needed, then you may take acetaminophen (Tylenol) or ibuprofen (Advil) as needed. 2. Take your usually prescribed medications unless otherwise directed. 3. If you need a refill on your pain medication, please contact your pharmacy.  They will contact our office to request authorization. Prescriptions will not be filled after 5pm or on week-ends. 4. You should follow a light diet the first few days after arrival home, such as soup and crackers, etc.  Be sure to include lots of fluids daily. 5. Most patients will experience some swelling and bruising in the area of the incisions.  Ice packs will help.  Swelling and bruising can take several days to resolve.  6. It is common to experience some constipation if taking pain medication after surgery.  Increasing fluid intake and taking a stool softener (such as Colace) will usually help or prevent this problem from occurring.  A mild laxative (Milk of Magnesia or Miralax) should be taken according to package instructions if there are no bowel movements after 48 hours. 7. Unless discharge instructions indicate otherwise, you may remove your bandages 24-48 hours after surgery, and you may shower at that time.  You may have steri-strips (small skin tapes) in place directly over the incision.  These strips should be left on the skin for 7-10 days.  If your surgeon used skin glue on the incision, you may shower in 24 hours.  The glue will flake off over the next 2-3 weeks.  Any sutures or  staples will be removed at the office during your follow-up visit. 8. ACTIVITIES:  You may resume regular (light) daily activities beginning the next day--such as daily self-care, walking, climbing stairs--gradually increasing activities as tolerated.  You may have sexual intercourse when it is comfortable.  Refrain from any heavy lifting or straining until approved by your doctor. a. You may drive when you are no longer taking prescription pain medication, you can comfortably wear a seatbelt, and you can safely maneuver your car and apply brakes. b. RETURN TO WORK:  __________________________________________________________ 9. You should see your doctor in the office for a follow-up appointment approximately 2-3 weeks after your surgery.  Make sure that you call for this appointment within a day or two after you arrive home to insure a convenient appointment time. 10. OTHER INSTRUCTIONS: __________________________________________________________________________________________________________________________ __________________________________________________________________________________________________________________________ WHEN TO CALL YOUR DOCTOR: 1. Fever over 101.0 2. Inability to urinate 3. Continued bleeding from incision. 4. Increased pain, redness, or drainage from the incision. 5. Increasing abdominal pain  The clinic staff is available to answer your questions during regular business hours.  Please don't hesitate to call and ask to speak to one of the nurses for clinical concerns.  If you have a medical emergency, go to the nearest emergency room or call 911.  A surgeon from [facility] is always on call at the hospital. [address] [place], [place], [place]  [address] ? [address], [place], [place]   [address] [phone] ? [phone] ? FAX [phone] Web site:  [email or web address]  Appointment for Adam Fowler, and MAC

## 2020-01-25 LAB — CBC WITH DIFFERENTIAL/PLATELET
Abs Immature Granulocytes: 0.63 10*3/uL — ABNORMAL HIGH (ref 0.00–0.07)
Basophils Absolute: 0.1 10*3/uL (ref 0.0–0.1)
Basophils Relative: 1 %
Eosinophils Absolute: 0 10*3/uL (ref 0.0–0.5)
Eosinophils Relative: 0 %
HCT: 35.2 % — ABNORMAL LOW (ref 39.0–52.0)
Hemoglobin: 11.9 g/dL — ABNORMAL LOW (ref 13.0–17.0)
Immature Granulocytes: 4 %
Lymphocytes Relative: 9 %
Lymphs Abs: 1.4 10*3/uL (ref 0.7–4.0)
MCH: 31.1 pg (ref 26.0–34.0)
MCHC: 33.8 g/dL (ref 30.0–36.0)
MCV: 91.9 fL (ref 80.0–100.0)
Monocytes Absolute: 1 10*3/uL (ref 0.1–1.0)
Monocytes Relative: 7 %
Neutro Abs: 12.4 10*3/uL — ABNORMAL HIGH (ref 1.7–7.7)
Neutrophils Relative %: 79 %
Platelets: 229 10*3/uL (ref 150–400)
RBC: 3.83 MIL/uL — ABNORMAL LOW (ref 4.22–5.81)
RDW: 13.2 % (ref 11.5–15.5)
WBC: 15.5 10*3/uL — ABNORMAL HIGH (ref 4.0–10.5)
nRBC: 0 % (ref 0.0–0.2)

## 2020-01-25 LAB — COMPREHENSIVE METABOLIC PANEL
ALT: 123 U/L — ABNORMAL HIGH (ref 0–44)
AST: 64 U/L — ABNORMAL HIGH (ref 15–41)
Albumin: 2 g/dL — ABNORMAL LOW (ref 3.5–5.0)
Alkaline Phosphatase: 208 U/L — ABNORMAL HIGH (ref 38–126)
Anion gap: 10 (ref 5–15)
BUN: 21 mg/dL (ref 8–23)
CO2: 22 mmol/L (ref 22–32)
Calcium: 7.7 mg/dL — ABNORMAL LOW (ref 8.9–10.3)
Chloride: 104 mmol/L (ref 98–111)
Creatinine, Ser: 1.09 mg/dL (ref 0.61–1.24)
GFR calc Af Amer: >60 mL/min (ref >60)
GFR calc non Af Amer: >60 mL/min (ref >60)
Glucose, Bld: 122 mg/dL — ABNORMAL HIGH (ref 70–99)
Potassium: 3.4 mmol/L — ABNORMAL LOW (ref 3.5–5.1)
Sodium: 136 mmol/L (ref 135–145)
Total Bilirubin: 2.3 mg/dL — ABNORMAL HIGH (ref 0.3–1.2)
Total Protein: 5.2 g/dL — ABNORMAL LOW (ref 6.5–8.1)

## 2020-01-25 LAB — GLUCOSE, CAPILLARY: Glucose-Capillary: 148 mg/dL — ABNORMAL HIGH (ref 70–99)

## 2020-01-25 MED ORDER — OXYCODONE HCL 5 MG PO TABS: 5.0000 mg | ORAL_TABLET | Freq: Four times a day (QID) | ORAL | 0 refills | Status: DC | PRN

## 2020-01-25 MED ORDER — ACETAMINOPHEN 325 MG PO TABS: 650.0000 mg | ORAL_TABLET | Freq: Four times a day (QID) | ORAL | Status: AC | PRN

## 2020-01-25 MED ORDER — POTASSIUM CHLORIDE CRYS ER 20 MEQ PO TBCR
40.0000 meq | EXTENDED_RELEASE_TABLET | Freq: Once | ORAL | Status: AC
  Administered 2020-01-25: 40 meq via ORAL
  Filled 2020-01-25: qty 2

## 2020-01-25 MED ORDER — AMOXICILLIN-POT CLAVULANATE 875-125 MG PO TABS
1.0000 | ORAL_TABLET | Freq: Two times a day (BID) | ORAL | 0 refills | Status: AC
Start: 2020-01-25 — End: 2020-01-28

## 2020-01-25 NOTE — Progress Notes (Signed)
[facility] Progress Note  1 Day Post-Op  Subjective: CC:  Reports mild soreness around drain site. Tolerating clears. +flatus, no BM. Ambulating in room. Voiding without urinary sxs.  Objective: Vital signs in last 24 hours: Temp:  [97.7 F (36.5 C)-98.4 F (36.9 C)] 98.3 F (36.8 C) (05/19 0500) Pulse Rate:  [65-86] 80 (05/19 0500) Resp:  [8-19] 16 (05/19 0500) BP: (108-139)/(65-91) 108/66 (05/19 0500) SpO2:  [93 %-98 %] 98 % (05/19 0500) Weight:  [90.7 kg] 90.7 kg (05/19 0452) Last BM Date: 01/23/20(pt reported scant BM   Simultaneous filing. User may not have seen previous data.)  Intake/Output from previous day: 05/18 0701 - 05/19 0700 In: 3072 [P.O.:120; I.V.:2152; IV Piggyback:800] Out: 280 [Drains:180; Blood:100] Intake/Output this shift: Total I/O In: -  Out: 45 [Drains:45]  PE: Gen:  Alert, NAD, pleasant Card:  Regular rate and rhythm Pulm:  Normal effort Abd: Soft, appropriately tender around incisions and blake drain, mild ecchymosis around umbilicus, no peritonitis  RUQ blake drain - 180 cc SS/sanguinous. Skin: warm and dry, no rashes  Psych: A&Ox3   Lab Results:  Recent Labs    01/24/20 0416 01/25/20 0324  WBC 11.4* 15.5*  HGB 13.5 11.9*  HCT 39.5 35.2*  PLT 194 229   BMET Recent Labs    01/24/20 0416 01/25/20 0324  NA 137 136  K 3.9 3.4*  CL 102 104  CO2 25 22  GLUCOSE 149* 122*  BUN 20 21  CREATININE 1.16 1.09  CALCIUM 8.2* 7.7*   PT/INR No results for input(s): LABPROT, INR in the last 72 hours. CMP     Component Value Date/Time   NA 136 01/25/2020 0324   K 3.4 (L) 01/25/2020 0324   CL 104 01/25/2020 0324   CO2 22 01/25/2020 0324   GLUCOSE 122 (H) 01/25/2020 0324   BUN 21 01/25/2020 0324   CREATININE 1.09 01/25/2020 0324   CALCIUM 7.7 (L) 01/25/2020 0324   PROT 5.2 (L) 01/25/2020 0324   ALBUMIN 2.0 (L) 01/25/2020 0324   AST 64 (H) 01/25/2020 0324   ALT 123 (H) 01/25/2020 0324   ALKPHOS 208 (H) 01/25/2020  0324   BILITOT 2.3 (H) 01/25/2020 0324   GFRNONAA >60 01/25/2020 0324   GFRAA >60 01/25/2020 0324   Lipase     Component Value Date/Time   LIPASE 84 (H) 01/24/2020 0416       Studies/Results: DG ERCP BILIARY & PANCREATIC DUCTS  Result Date: 01/23/2020 CLINICAL DATA:  64 year old male with a history of biliary ductal obstruction EXAM: ERCP TECHNIQUE: Multiple spot images obtained with the fluoroscopic device and submitted for interpretation post-procedure. FLUOROSCOPY TIME:  Fluoroscopy Time:  4 minutes 44 seconds COMPARISON:  Prior ERCP 01/21/2020 FINDINGS: A total of 6 intraoperative spot images and a cine clips are submitted for review. The images demonstrate first cannulation of the main pancreatic duct with placement of a small pancreatic stent. Subsequently, the common bile duct is cannulated. The common bile duct is then balloon swept IMPRESSION: 1. Placement of a plastic pancreatic duct stent. 2. ERCP with balloon sweeping of the common bile duct. These images were submitted for radiologic interpretation only. Please see the procedural report for the amount of contrast and the fluoroscopy time utilized. Electronically Signed   By: Jacqulynn Cadet M.D.   On: 01/23/2020 16:37    Anti-infectives: Anti-infectives (From admission, onward)   Start     Dose/Rate Route Frequency Ordered Stop   01/21/20 1500  Ampicillin-Sulbactam (UNASYN) 3 g in  sodium chloride 0.9 % 100 mL IVPB     3 g 200 mL/hr over 30 Minutes Intravenous Every 6 hours 01/21/20 0845     01/21/20 0600  piperacillin-tazobactam (ZOSYN) IVPB 3.375 g  Status:  Discontinued     3.375 g 12.5 mL/hr over 240 Minutes Intravenous Every 8 hours 01/21/20 0454 01/21/20 0845     Assessment/Plan HTN HLD  Gallstone pancreatitis Acute cholecystitis E.coli bacteremia   POD#1 s/p laparoscopic subtotal cholecystectomy, placement blake drain  - abx per primary team for bacteremia  - pain controlled, voiding, tolerating clears,  drain SS/non-bilious - advance to low fat diet - stable for discharge home from a surgical perspective, follow up in 1 week for drain removal and 2 weeks in our clinic. PRN oxycodone sent to patients pharmacy.  - I discussed drain care with his wife, [name], over the phone. She is an Therapist, sports. I answered her questions and she stated she felt comfortable documenting drain output, recharging drain. Patient and wife notified to call our office if patients drain changes to a green/brown color, as this would be concerning for a post-op bile leak.      LOS: 4 days    Obie Dredge, [facility] Please see Amion for pager number during day hours 7:00am-4:30pm

## 2020-01-25 NOTE — Discharge Summary (Signed)
PATIENT DETAILS Name: Adam Fowler Age: 64 y.o. Sex: male Date of Birth: 1956/08/03 MRN: [id number]. Admitting Physician: Cheryll Dessert, MD [id number], Zannie Cove, MD  Admit Date: 01/21/2020 Discharge date: 01/25/2020  Recommendations for Outpatient Follow-up:  1. Follow up with PCP in 1-2 weeks 2. Please obtain CMP/CBC in one week  Admitted From:  Home  Disposition: Anna: No  Equipment/Devices: Brake drain by [facility] on 5/18  Discharge Condition: Stable  CODE STATUS: FULL CODE  Diet recommendation:  Diet Order            Diet regular Room service appropriate? Yes; Fluid consistency: Thin  Diet effective now        Diet - low sodium heart healthy               Brief Narrative: Patient is a 64 y.o. male with history of HTN, HLD-who presented as a transfer from [place] for evaluation of gallstone pancreatitis, E. coli bacteremia, AKI and septic shock.  He was managed in the ICU-briefly required pressors-stabilized-and subsequently transferred to the [facility] hospitalist service.  Significant events: 5/15>> transfer from [facility] to [facility] ICU for septic shock/AKI/gallstone pancreatitis 5/16>> transfer to [facility]  Antimicrobial therapy: Unasyn: 5/15>>5/19 Augmentin: 5/19>>  Microbiology data: 5/14: Blood cultures at [facility]>> E. Coli   Significant studies: 5/14>> HIDA scan: Nonvisualization of the gallbladder after 2 hours 5/14>> chest x-ray: No active disease 5/14>> CT abdomen/pelvis:signs of acute cholecystitis with acute interstitial pancreatitis, bilateral nephrolithiasis, diverticulosis  Procedures : 5/14>> unsuccessful ERCP-unable to cannulate CBD-pancreatic duct stent placed. 5/17>>ERCP: Able to cannulate pancreatic duct-no debris/purulence found-pancreatic duct stent placed. 5/18>> laparoscopic cholecystectomy with placement of JP drain.  Consults: GI, PCCM, general surgery  Brief Hospital Course: Gallstone  pancreatitis with obstructive jaundice:  Managed with supportive care-GI and CTS was consulted.  Significantly better s/p ERCP on 5/17-subsequently underwent laparoscopic cholecystectomy on 5/18.  LFTs decreasing-cleared by general surgery for discharge-they will arrange outpatient follow-up.  PCP to follow LFTs in 1 week.  AKI: Likely hemodynamically mediated in setting of gallstone pancreatitis/bacteremia.  Renal failure has resolved-continue to follow periodically.  Septic shock with E. coli bacteremia (present on admission): Sepsis physiology has resolved-briefly required pressors on admission-managed with IV Unasyn-we will switch to Augmentin on discharge.  E. coli pansensitive except to ciprofloxacin.  HTN: BP relatively controlled without the use of any antihypertensives.  Continue to hold for another few more days-follow with PCP before resuming.  HLD: Continue to hold statin for a few more days-allow LFTs to improve further before resuming.  Note: Spouse updated regarding discharge plans today-she is aware of antihypertensives and statins being on hold till his BP/LFTs allow.  Discharge Diagnoses:  Active Problems:   Pancreatitis   Bacteremia   Elevated LFTs   Jaundice   Discharge Instructions:  Activity:  As tolerated   Discharge Instructions    Call MD for:  difficulty breathing, headache or visual disturbances   Complete by: As directed    Call MD for:  redness, tenderness, or signs of infection (pain, swelling, redness, odor or green/yellow discharge around incision site)   Complete by: As directed    Diet - low sodium heart healthy   Complete by: As directed    Discharge instructions   Complete by: As directed    1) your blood pressure and cholesterol medications are on hold-please talk to your primary care practitioner before resuming blood pressure medications.  2) please follow with general surgery as  instructed.   Follow with Primary MD  Melony Overly, MD  in 1-2 weeks  Please get a complete blood count and chemistry panel checked by your Primary MD at your next visit, and again as instructed by your Primary MD.  Get Medicines reviewed and adjusted: Please take all your medications with you for your next visit with your Primary MD  Laboratory/radiological data: Please request your Primary MD to go over all hospital tests and procedure/radiological results at the follow up, please ask your Primary MD to get all Hospital records sent to his/her office.  In some cases, they will be blood work, cultures and biopsy results pending at the time of your discharge. Please request that your primary care M.D. follows up on these results.  Also Note the following: If you experience worsening of your admission symptoms, develop shortness of breath, life threatening emergency, suicidal or homicidal thoughts you must seek medical attention immediately by calling 911 or calling your MD immediately  if symptoms less severe.  You must read complete instructions/literature along with all the possible adverse reactions/side effects for all the Medicines you take and that have been prescribed to you. Take any new Medicines after you have completely understood and accpet all the possible adverse reactions/side effects.   Do not drive when taking Pain medications or sleeping medications (Benzodaizepines)  Do not take more than prescribed Pain, Sleep and Anxiety Medications. It is not advisable to combine anxiety,sleep and pain medications without talking with your primary care practitioner  Special Instructions: If you have smoked or chewed Tobacco  in the last 2 yrs please stop smoking, stop any regular Alcohol  and or any Recreational drug use.  Wear Seat belts while driving.  Please note: You were cared for by a hospitalist during your hospital stay. Once you are discharged, your primary care physician will handle any further medical issues. Please note that NO  REFILLS for any discharge medications will be authorized once you are discharged, as it is imperative that you return to your primary care physician (or establish a relationship with a primary care physician if you do not have one) for your post hospital discharge needs so that they can reassess your need for medications and monitor your lab values.   Increase activity slowly   Complete by: As directed      Allergies as of 01/25/2020   No Known Allergies     Medication List    STOP taking these medications   ciprofloxacin 500 MG tablet Commonly known as: CIPRO   ibuprofen 200 MG tablet Commonly known as: ADVIL   lisinopril-hydrochlorothiazide 10-12.5 MG tablet Commonly known as: ZESTORETIC   pravastatin 80 MG tablet Commonly known as: PRAVACHOL     TAKE these medications   acetaminophen 325 MG tablet Commonly known as: TYLENOL Take 2 tablets (650 mg total) by mouth every 6 (six) hours as needed for up to 7 days.   amoxicillin-clavulanate 875-125 MG tablet Commonly known as: Augmentin Take 1 tablet by mouth 2 (two) times daily for 6 doses.   aspirin EC 81 MG tablet Take 81 mg by mouth 2 (two) times daily.   CALCIUM 1000 + D PO Take 1 tablet by mouth daily.   CoQ10 400 MG Caps Take 800 mg by mouth daily.   dicyclomine 10 MG capsule Commonly known as: BENTYL Take 10 mg by mouth every 6 (six) hours as needed for spasms.   GLUCOSAMINE-CHONDROITIN PO Take 1 tablet by mouth 2 (two) times  daily.   Krill Oil 1000 MG Caps Take 1,000-2,000 mg by mouth See admin instructions. Take one capsule (1000 mg) by mouth every morning and two capsules (2000 mg) at bedtime   MORINGA PO Take 1,000 mg by mouth 2 (two) times daily.   multivitamin with minerals Tabs tablet Take 1 tablet by mouth daily.   oxyCODONE 5 MG immediate release tablet Commonly known as: Oxy IR/ROXICODONE Take 1 tablet (5 mg total) by mouth every 6 (six) hours as needed for moderate pain or severe pain (pain  not releived by tylenol or ibuprofen).   Red Yeast Rice 600 MG Caps Take 1,800 mg by mouth at bedtime.   sertraline 50 MG tablet Commonly known as: ZOLOFT Take 50 mg by mouth daily.   sertraline 100 MG tablet Commonly known as: ZOLOFT Take 100 mg by mouth daily.   TURMERIC PO Take 1,500 mg by mouth 2 (two) times daily.   Vitamin B-12 5000 MCG Tbdp Take 5,000 mcg by mouth daily.   zolpidem 5 MG tablet Commonly known as: AMBIEN Take 5 mg by mouth at bedtime.      Follow-up Information    [facility] Follow up on 02/07/2020.   Specialty: General Surgery Why: Your appointment is at 11:15 AM.  Be at the office 15 minutes early for check-in.  Bring photo ID and insurance information. Contact information: [address] [place]  [address] [phone]        [facility], [place]. Go on 02/01/2020.   Specialty: General Surgery Why: you have an appointment at 10:00 AM for drain removal, please arrive by 9:30 AM Contact information: [place] [phone]       Melony Overly, MD. Schedule an appointment as soon as possible for a visit in 1 week(s).   Specialty: Family Medicine Contact information: [place] [address] [phone]          No Known Allergies   Other Procedures/Studies: DG ERCP BILIARY & PANCREATIC DUCTS  Result Date: 01/23/2020 CLINICAL DATA:  64 year old male with a history of biliary ductal obstruction EXAM: ERCP TECHNIQUE: Multiple spot images obtained with the fluoroscopic device and submitted for interpretation post-procedure. FLUOROSCOPY TIME:  Fluoroscopy Time:  4 minutes 44 seconds COMPARISON:  Prior ERCP 01/21/2020 FINDINGS: A total of 6 intraoperative spot images and a cine clips are submitted for review. The images demonstrate first cannulation of the main pancreatic duct with placement of a small pancreatic stent.  Subsequently, the common bile duct is cannulated. The common bile duct is then balloon swept IMPRESSION: 1. Placement of a plastic pancreatic duct stent. 2. ERCP with balloon sweeping of the common bile duct. These images were submitted for radiologic interpretation only. Please see the procedural report for the amount of contrast and the fluoroscopy time utilized. Electronically Signed   By: Jacqulynn Cadet M.D.   On: 01/23/2020 16:37   DG ERCP BILIARY & PANCREATIC DUCTS  Result Date: 01/21/2020 CLINICAL DATA:  Portable fluoroscopic imaging for your CP. EXAM: ERCP TECHNIQUE: Single spot image obtained with the fluoroscopic device and submitted for interpretation post-procedure. FLUOROSCOPY TIME:  Fluoroscopy Time:  5 minutes 43 seconds Radiation Exposure Index (if provided by the fluoroscopic device): 96.95 mGy Number of Acquired Spot Images: 1 COMPARISON:  CT, 01/20/2020 FINDINGS: Single image shows the ERCP endoscope, tip projecting in the right mid abdomen. IMPRESSION: Fluoroscopy for ERCP. These images were submitted for radiologic  interpretation only. Please see the procedural report for the amount of contrast and the fluoroscopy time utilized. Electronically Signed   By: Lajean Manes M.D.   On: 01/21/2020 15:28     TODAY-DAY OF DISCHARGE:  Subjective:   Lam Hicken today has no headache,no chest abdominal pain,no new weakness tingling or numbness, feels much better wants to go home today.   Objective:   Blood pressure 108/66, pulse 80, temperature 98.3 F (36.8 C), temperature source Oral, resp. rate 16, height 5\' 9"  (1.753 m), weight 90.7 kg, SpO2 98 %.  Intake/Output Summary (Last 24 hours) at 01/25/2020 1022 Last data filed at 01/25/2020 1000 Gross per 24 hour  Intake 3813.14 ml  Output 325 ml  Net 3488.14 ml   Filed Weights   01/23/20 0500 01/24/20 0413 01/25/20 0452  Weight: 90.3 kg 90.5 kg 90.7 kg    Exam: Awake Alert, Oriented *3, No new F.N deficits, Normal  affect [place].AT,PERRAL Supple Neck,No JVD, No cervical lymphadenopathy appriciated.  Symmetrical Chest wall movement, Good air movement bilaterally, CTAB RRR,No Gallops,Rubs or new Murmurs, No Parasternal Heave +ve B.Sounds, Abd Soft, Non tender, No organomegaly appriciated, No rebound -guarding or rigidity. No Cyanosis, Clubbing or edema, No new Rash or bruise   PERTINENT RADIOLOGIC STUDIES: DG ERCP BILIARY & PANCREATIC DUCTS  Result Date: 01/23/2020 CLINICAL DATA:  64 year old male with a history of biliary ductal obstruction EXAM: ERCP TECHNIQUE: Multiple spot images obtained with the fluoroscopic device and submitted for interpretation post-procedure. FLUOROSCOPY TIME:  Fluoroscopy Time:  4 minutes 44 seconds COMPARISON:  Prior ERCP 01/21/2020 FINDINGS: A total of 6 intraoperative spot images and a cine clips are submitted for review. The images demonstrate first cannulation of the main pancreatic duct with placement of a small pancreatic stent. Subsequently, the common bile duct is cannulated. The common bile duct is then balloon swept IMPRESSION: 1. Placement of a plastic pancreatic duct stent. 2. ERCP with balloon sweeping of the common bile duct. These images were submitted for radiologic interpretation only. Please see the procedural report for the amount of contrast and the fluoroscopy time utilized. Electronically Signed   By: Jacqulynn Cadet M.D.   On: 01/23/2020 16:37     PERTINENT LAB RESULTS: CBC: Recent Labs    01/24/20 0416 01/25/20 0324  WBC 11.4* 15.5*  HGB 13.5 11.9*  HCT 39.5 35.2*  PLT 194 229   CMET CMP     Component Value Date/Time   NA 136 01/25/2020 0324   K 3.4 (L) 01/25/2020 0324   CL 104 01/25/2020 0324   CO2 22 01/25/2020 0324   GLUCOSE 122 (H) 01/25/2020 0324   BUN 21 01/25/2020 0324   CREATININE 1.09 01/25/2020 0324   CALCIUM 7.7 (L) 01/25/2020 0324   PROT 5.2 (L) 01/25/2020 0324   ALBUMIN 2.0 (L) 01/25/2020 0324   AST 64 (H) 01/25/2020 0324    ALT 123 (H) 01/25/2020 0324   ALKPHOS 208 (H) 01/25/2020 0324   BILITOT 2.3 (H) 01/25/2020 0324   GFRNONAA >60 01/25/2020 0324   GFRAA >60 01/25/2020 0324    GFR Estimated Creatinine Clearance: 77.2 mL/min (by C-G formula based on SCr of 1.09 mg/dL). Recent Labs    01/24/20 0416  LIPASE 84*   No results for input(s): CKTOTAL, CKMB, CKMBINDEX, TROPONINI in the last 72 hours. Invalid input(s): POCBNP No results for input(s): DDIMER in the last 72 hours. No results for input(s): HGBA1C in the last 72 hours. No results for input(s): CHOL, HDL, LDLCALC, TRIG, CHOLHDL, LDLDIRECT  in the last 72 hours. No results for input(s): TSH, T4TOTAL, T3FREE, THYROIDAB in the last 72 hours.  Invalid input(s): FREET3 No results for input(s): VITAMINB12, FOLATE, FERRITIN, TIBC, IRON, RETICCTPCT in the last 72 hours. Coags: No results for input(s): INR in the last 72 hours.  Invalid input(s): PT Microbiology: Recent Results (from the past 240 hour(s))  MRSA PCR Screening     Status: None   Collection Time: 01/21/20  3:29 AM   Specimen: Nasopharyngeal  Result Value Ref Range Status   MRSA by PCR NEGATIVE NEGATIVE Final    Comment:        The GeneXpert MRSA Assay (FDA approved for NASAL specimens only), is one component of a comprehensive MRSA colonization surveillance program. It is not intended to diagnose MRSA infection nor to guide or monitor treatment for MRSA infections. Performed at [facility], [place] [address], [place], [place] [address]   Surgical PCR screen     Status: None   Collection Time: 01/24/20  7:40 AM   Specimen: Nasal Mucosa; Nasal Swab  Result Value Ref Range Status   MRSA, PCR NEGATIVE NEGATIVE Final   Staphylococcus aureus NEGATIVE NEGATIVE Final    Comment: (NOTE) The Xpert SA Assay (FDA approved for NASAL specimens in patients 65 years of age and older), is one component of a comprehensive surveillance program. It is not intended to diagnose infection nor  to guide or monitor treatment. Performed at [facility], [place] [address], [place], [place] [address]     FURTHER DISCHARGE INSTRUCTIONS:  Get Medicines reviewed and adjusted: Please take all your medications with you for your next visit with your Primary MD  Laboratory/radiological data: Please request your Primary MD to go over all hospital tests and procedure/radiological results at the follow up, please ask your Primary MD to get all Hospital records sent to his/her office.  In some cases, they will be blood work, cultures and biopsy results pending at the time of your discharge. Please request that your primary care M.D. goes through all the records of your hospital data and follows up on these results.  Also Note the following: If you experience worsening of your admission symptoms, develop shortness of breath, life threatening emergency, suicidal or homicidal thoughts you must seek medical attention immediately by calling 911 or calling your MD immediately  if symptoms less severe.  You must read complete instructions/literature along with all the possible adverse reactions/side effects for all the Medicines you take and that have been prescribed to you. Take any new Medicines after you have completely understood and accpet all the possible adverse reactions/side effects.   Do not drive when taking Pain medications or sleeping medications (Benzodaizepines)  Do not take more than prescribed Pain, Sleep and Anxiety Medications. It is not advisable to combine anxiety,sleep and pain medications without talking with your primary care practitioner  Special Instructions: If you have smoked or chewed Tobacco  in the last 2 yrs please stop smoking, stop any regular Alcohol  and or any Recreational drug use.  Wear Seat belts while driving.  Please note: You were cared for by a hospitalist during your hospital stay. Once you are discharged, your primary care physician will handle any  further medical issues. Please note that NO REFILLS for any discharge medications will be authorized once you are discharged, as it is imperative that you return to your primary care physician (or establish a relationship with a primary care physician if you do not  have one) for your post hospital discharge needs so that they can reassess your need for medications and monitor your lab values.  Total Time spent coordinating discharge including counseling, education and face to face time equals 335 minutes.  SignedOren Binet 01/25/2020 10:22 AM

## 2020-01-25 NOTE — Care Management (Signed)
Pt deemed stable for discharge home today.  CM reviewed chart for TOC needs/consults - none determined.  Discharge order written - CM signing off

## 2020-01-25 NOTE — Plan of Care (Signed)
  Problem: Education: Goal: Knowledge of General Education information will improve Description: Including pain rating scale, medication(s)/side effects and non-pharmacologic comfort measures Outcome: Adequate for Discharge   Problem: Health Behavior/Discharge Planning: Goal: Ability to manage health-related needs will improve Outcome: Adequate for Discharge   Problem: Clinical Measurements: Goal: Ability to maintain clinical measurements within normal limits will improve Outcome: Adequate for Discharge Goal: Will remain free from infection Outcome: Adequate for Discharge Goal: Diagnostic test results will improve Outcome: Adequate for Discharge   Problem: Nutrition: Goal: Adequate nutrition will be maintained Outcome: Adequate for Discharge   Problem: Pain Managment: Goal: General experience of comfort will improve Outcome: Adequate for Discharge

## 2020-01-25 NOTE — Progress Notes (Signed)
Patient and wife instructed on discharge instructions at this time.  Informed on Medication administration, follow up appointments, and dc instructions.  PIVs were removed and patient dressed and all belongings taken at this time.  Patient taken down to personal car in a wheelchair where wife was waiting in car.  No s/s of distress noted.

## 2020-01-26 LAB — SURGICAL PATHOLOGY

## 2020-01-28 NOTE — Op Note (Signed)
   Operative Note  Date: 01/25/2020  Procedure: subtotal cholecystectomy  Pre-op diagnosis: acute cholecystitis Post-op diagnosis:  Acute  gangrenous  cholecystitis, grade 2, gallbladder hydrops  Indication and clinical history: The patient is a 64 y.o. year old male with a one week history of right upper quadrant pain, leukocytosis, and imaging findings c/w cholecystitis.  Surgeon: Jesusita Oka, MD Assistant: Grandville Silos, MD  Anesthesia: General  Findings:  Specimen: anterior wall of gallbladder EBL: 30cc Drains/Implants: JP drain, RUQ  Disposition: PACU - hemodynamically stable.  Description of procedure: The patient was positioned supine on the operating room table. Time-out was performed verifying correct patient, procedure, signature of informed consent, and administration of pre-operative antibiotics. General anesthetic induction and intubation were uneventful. The abdomen was prepped and draped in the usual sterile fashion. An infra-umbilical incision was made using an open technique using zero vicryl stay sutures on either side of the fascia and a 85mm Hassan port inserted. After establishing pneumoperitoneum, which the patient tolerated well, the abdominal cavity was inspected and no injury of any intra-abdominal structures was identified. Additional ports were placed under direct visualization and using local anesthetic: two 14mm ports in the right subcostal region and a 2mm port in the epigastric region. The patient was re-positioned to reverse Trendelenburg and right side up. Adhesiolysis was performed to expose the gallbladder which was then retracted cephalad. The base of the gallbladder was grasped, but the infundibulum and the triangle of Calot could not be definitively identified due to dense adhesions. The decision was made to convert to a dome-down approach. During this dissection, the lumen of the gallbladder was entered and the posterior wall of the gallbladder visualized  and confirmed to be completely necrotic. The anterior wall of the gallbladder was dissected off the liver down to its visualizable base and transected. It was removed via the umbilical port site after being placed in an endoscopic retrieval bag. Hemostasis was achieved using electrocautery. All bilious fluid, which was white in color, was suctioned and the right upper quadrant irrigated. A drain was placed in the right upper quadrant and brought out through the most lateral port site. The abdomen was desufflated and the fascia of the umbilical port site was closed using the previously placed stay sutures. Additional local anesthetic was administered at the umbilical port site.  The skin of all incisions was closed with 4-0 monocryl. Sterile dressings were applied. All sponge and instrument counts were correct at the conclusion of the procedure. The patient was awakened from anesthesia, extubated uneventfully, and transported to the PACU - hemodynamically stable.. There were no complications.    Jesusita Oka, MD General and [facility]

## 2020-02-01 ENCOUNTER — Other Ambulatory Visit: Payer: Self-pay | Admitting: Surgery

## 2020-02-01 ENCOUNTER — Inpatient Hospital Stay: Admission: AD | Admit: 2020-02-01 | Payer: 59 | Source: Ambulatory Visit | Admitting: Surgery

## 2020-02-02 ENCOUNTER — Observation Stay (HOSPITAL_COMMUNITY)
Admission: AD | Admit: 2020-02-02 | Discharge: 2020-02-04 | Disposition: A | Discharge status: Home / Self Care | Payer: 59 | Source: Ambulatory Visit | Attending: Surgery | Admitting: Surgery

## 2020-02-02 ENCOUNTER — Inpatient Hospital Stay (HOSPITAL_COMMUNITY): Payer: 59

## 2020-02-02 ENCOUNTER — Other Ambulatory Visit: Payer: Self-pay | Admitting: Surgery

## 2020-02-02 DIAGNOSIS — R161 Splenomegaly, not elsewhere classified: Secondary | ICD-10-CM | POA: Insufficient documentation

## 2020-02-02 DIAGNOSIS — J90 Pleural effusion, not elsewhere classified: Secondary | ICD-10-CM | POA: Diagnosis not present

## 2020-02-02 DIAGNOSIS — Z87442 Personal history of urinary calculi: Secondary | ICD-10-CM | POA: Diagnosis not present

## 2020-02-02 DIAGNOSIS — Z9049 Acquired absence of other specified parts of digestive tract: Secondary | ICD-10-CM | POA: Insufficient documentation

## 2020-02-02 DIAGNOSIS — I700 Atherosclerosis of aorta: Secondary | ICD-10-CM | POA: Diagnosis not present

## 2020-02-02 DIAGNOSIS — K5730 Diverticulosis of large intestine without perforation or abscess without bleeding: Secondary | ICD-10-CM | POA: Insufficient documentation

## 2020-02-02 DIAGNOSIS — D72829 Elevated white blood cell count, unspecified: Secondary | ICD-10-CM | POA: Diagnosis not present

## 2020-02-02 DIAGNOSIS — G4700 Insomnia, unspecified: Secondary | ICD-10-CM | POA: Diagnosis not present

## 2020-02-02 DIAGNOSIS — I10 Essential (primary) hypertension: Secondary | ICD-10-CM | POA: Diagnosis not present

## 2020-02-02 DIAGNOSIS — N200 Calculus of kidney: Secondary | ICD-10-CM | POA: Insufficient documentation

## 2020-02-02 LAB — CBC WITH DIFFERENTIAL/PLATELET
Abs Immature Granulocytes: 0.1 10*3/uL — ABNORMAL HIGH (ref 0.00–0.07)
Abs Immature Granulocytes: 0.13 10*3/uL — ABNORMAL HIGH (ref 0.00–0.07)
Basophils Absolute: 0.1 10*3/uL (ref 0.0–0.1)
Basophils Absolute: 0.1 10*3/uL (ref 0.0–0.1)
Basophils Relative: 0 %
Basophils Relative: 0 %
Eosinophils Absolute: 0.2 10*3/uL (ref 0.0–0.5)
Eosinophils Absolute: 0.2 10*3/uL (ref 0.0–0.5)
Eosinophils Relative: 2 %
Eosinophils Relative: 2 %
HCT: 38.2 % — ABNORMAL LOW (ref 39.0–52.0)
HCT: 36.9 % — ABNORMAL LOW (ref 39.0–52.0)
Hemoglobin: 12.8 g/dL — ABNORMAL LOW (ref 13.0–17.0)
Hemoglobin: 12.2 g/dL — ABNORMAL LOW (ref 13.0–17.0)
Immature Granulocytes: 1 %
Immature Granulocytes: 1 %
Lymphocytes Relative: 9 %
Lymphocytes Relative: 8 %
Lymphs Abs: 1.3 10*3/uL (ref 0.7–4.0)
Lymphs Abs: 1.3 10*3/uL (ref 0.7–4.0)
MCH: 30.5 pg (ref 26.0–34.0)
MCH: 30 pg (ref 26.0–34.0)
MCHC: 33.5 g/dL (ref 30.0–36.0)
MCHC: 33.1 g/dL (ref 30.0–36.0)
MCV: 91 fL (ref 80.0–100.0)
MCV: 90.9 fL (ref 80.0–100.0)
Monocytes Absolute: 1.1 10*3/uL — ABNORMAL HIGH (ref 0.1–1.0)
Monocytes Absolute: 0.9 10*3/uL (ref 0.1–1.0)
Monocytes Relative: 7 %
Monocytes Relative: 6 %
Neutro Abs: 12.2 10*3/uL — ABNORMAL HIGH (ref 1.7–7.7)
Neutro Abs: 12.3 10*3/uL — ABNORMAL HIGH (ref 1.7–7.7)
Neutrophils Relative %: 81 %
Neutrophils Relative %: 83 %
Platelets: 378 10*3/uL (ref 150–400)
Platelets: 391 10*3/uL (ref 150–400)
RBC: 4.2 MIL/uL — ABNORMAL LOW (ref 4.22–5.81)
RBC: 4.06 MIL/uL — ABNORMAL LOW (ref 4.22–5.81)
RDW: 12.6 % (ref 11.5–15.5)
RDW: 12.6 % (ref 11.5–15.5)
WBC: 14.9 10*3/uL — ABNORMAL HIGH (ref 4.0–10.5)
WBC: 14.8 10*3/uL — ABNORMAL HIGH (ref 4.0–10.5)
nRBC: 0 % (ref 0.0–0.2)
nRBC: 0 % (ref 0.0–0.2)

## 2020-02-02 LAB — COMPREHENSIVE METABOLIC PANEL
ALT: 33 U/L (ref 0–44)
ALT: 35 U/L (ref 0–44)
AST: 34 U/L (ref 15–41)
AST: 37 U/L (ref 15–41)
Albumin: 2.4 g/dL — ABNORMAL LOW (ref 3.5–5.0)
Albumin: 2.4 g/dL — ABNORMAL LOW (ref 3.5–5.0)
Alkaline Phosphatase: 124 U/L (ref 38–126)
Alkaline Phosphatase: 123 U/L (ref 38–126)
Anion gap: 10 (ref 5–15)
Anion gap: 10 (ref 5–15)
BUN: 13 mg/dL (ref 8–23)
BUN: 12 mg/dL (ref 8–23)
CO2: 23 mmol/L (ref 22–32)
CO2: 24 mmol/L (ref 22–32)
Calcium: 8.3 mg/dL — ABNORMAL LOW (ref 8.9–10.3)
Calcium: 8.2 mg/dL — ABNORMAL LOW (ref 8.9–10.3)
Chloride: 103 mmol/L (ref 98–111)
Chloride: 102 mmol/L (ref 98–111)
Creatinine, Ser: 0.79 mg/dL (ref 0.61–1.24)
Creatinine, Ser: 0.76 mg/dL (ref 0.61–1.24)
GFR calc Af Amer: >60 mL/min (ref >60)
GFR calc Af Amer: >60 mL/min (ref >60)
GFR calc non Af Amer: >60 mL/min (ref >60)
GFR calc non Af Amer: >60 mL/min (ref >60)
Glucose, Bld: 104 mg/dL — ABNORMAL HIGH (ref 70–99)
Glucose, Bld: 115 mg/dL — ABNORMAL HIGH (ref 70–99)
Potassium: 4.1 mmol/L (ref 3.5–5.1)
Potassium: 3.7 mmol/L (ref 3.5–5.1)
Sodium: 136 mmol/L (ref 135–145)
Sodium: 136 mmol/L (ref 135–145)
Total Bilirubin: 1 mg/dL (ref 0.3–1.2)
Total Bilirubin: 1.3 mg/dL — ABNORMAL HIGH (ref 0.3–1.2)
Total Protein: 6 g/dL — ABNORMAL LOW (ref 6.5–8.1)
Total Protein: 6 g/dL — ABNORMAL LOW (ref 6.5–8.1)

## 2020-02-02 LAB — PHOSPHORUS
Phosphorus: 3.2 mg/dL (ref 2.5–4.6)
Phosphorus: 2.9 mg/dL (ref 2.5–4.6)

## 2020-02-02 LAB — PROTIME-INR
INR: 1.2 (ref 0.8–1.2)
INR: 1.1 (ref 0.8–1.2)
Prothrombin Time: 14.9 seconds (ref 11.4–15.2)
Prothrombin Time: 13.6 seconds (ref 11.4–15.2)

## 2020-02-02 LAB — MAGNESIUM
Magnesium: 1.9 mg/dL (ref 1.7–2.4)
Magnesium: 1.7 mg/dL (ref 1.7–2.4)

## 2020-02-02 MED ORDER — ENOXAPARIN SODIUM 40 MG/0.4ML ~~LOC~~ SOLN: 40.0000 mg | SUBCUTANEOUS | Status: DC

## 2020-02-02 MED ORDER — DOCUSATE SODIUM 100 MG PO CAPS
100.0000 mg | ORAL_CAPSULE | Freq: Two times a day (BID) | ORAL | Status: DC
  Filled 2020-02-02: qty 1

## 2020-02-02 MED ORDER — ONDANSETRON HCL 4 MG/2ML IJ SOLN: 4.0000 mg | Freq: Four times a day (QID) | INTRAMUSCULAR | Status: DC | PRN

## 2020-02-02 MED ORDER — DOCUSATE SODIUM 100 MG PO CAPS
100.0000 mg | ORAL_CAPSULE | Freq: Two times a day (BID) | ORAL | Status: DC
  Administered 2020-02-03: 100 mg via ORAL
  Filled 2020-02-02 (×2): qty 1

## 2020-02-02 MED ORDER — METHOCARBAMOL 500 MG PO TABS: 500.0000 mg | ORAL_TABLET | Freq: Four times a day (QID) | ORAL | Status: DC | PRN

## 2020-02-02 MED ORDER — ACETAMINOPHEN 500 MG PO TABS
1000.0000 mg | ORAL_TABLET | Freq: Four times a day (QID) | ORAL | Status: DC
  Administered 2020-02-03: 1000 mg via ORAL
  Administered 2020-02-03: 500 mg via ORAL
  Filled 2020-02-02 (×4): qty 2

## 2020-02-02 MED ORDER — IOHEXOL 9 MG/ML PO SOLN
ORAL | Status: AC
  Administered 2020-02-02: 500 mL
  Filled 2020-02-02: qty 500

## 2020-02-02 MED ORDER — PIPERACILLIN-TAZOBACTAM 3.375 G IVPB: 3.3750 g | Freq: Three times a day (TID) | INTRAVENOUS | Status: DC

## 2020-02-02 MED ORDER — ACETAMINOPHEN 500 MG PO TABS: 1000.0000 mg | ORAL_TABLET | Freq: Four times a day (QID) | ORAL | Status: DC

## 2020-02-02 MED ORDER — ONDANSETRON 4 MG PO TBDP: 4.0000 mg | ORAL_TABLET | Freq: Four times a day (QID) | ORAL | Status: DC | PRN

## 2020-02-02 MED ORDER — PIPERACILLIN-TAZOBACTAM 3.375 G IVPB
3.3750 g | Freq: Three times a day (TID) | INTRAVENOUS | Status: DC
  Administered 2020-02-02 – 2020-02-04 (×5): 3.375 g via INTRAVENOUS
  Filled 2020-02-02 (×5): qty 50

## 2020-02-02 MED ORDER — KETOROLAC TROMETHAMINE 30 MG/ML IJ SOLN: 30.0000 mg | Freq: Four times a day (QID) | INTRAMUSCULAR | Status: DC | PRN

## 2020-02-02 MED ORDER — IOHEXOL 300 MG/ML  SOLN
100.0000 mL | Freq: Once | INTRAMUSCULAR | Status: AC | PRN
  Administered 2020-02-02: 100 mL via INTRAVENOUS

## 2020-02-02 MED ORDER — LACTATED RINGERS IV SOLN: INTRAVENOUS | Status: DC

## 2020-02-02 NOTE — Progress Notes (Addendum)
Tried to contact Dr. Bobbye Morton on her listed phone number but no answer and mailbox is full. Just let her aware that patient is on the unit 6North room 09.  Awaiting IV Team to place IV catheter for IV antibiotic therapy.  IV Team order cancelled since IV site was initiated on the unit.  Dr. Bobbye Morton called back and made aware of patient's arrival to the unit and orders.

## 2020-02-02 NOTE — Progress Notes (Signed)
Patient arrived to 6N09, aox4, ambulatory, denies any pain, VSS, informed about visitation policy. Emptied jp drain of 25 mL output located a right lower quadrant of the abdomen. Will continue to monitor patient.

## 2020-02-03 ENCOUNTER — Inpatient Hospital Stay (HOSPITAL_COMMUNITY): Payer: 59

## 2020-02-03 LAB — COMPREHENSIVE METABOLIC PANEL
ALT: 35 U/L (ref 0–44)
AST: 35 U/L (ref 15–41)
Albumin: 2.4 g/dL — ABNORMAL LOW (ref 3.5–5.0)
Alkaline Phosphatase: 111 U/L (ref 38–126)
Anion gap: 8 (ref 5–15)
BUN: 10 mg/dL (ref 8–23)
CO2: 22 mmol/L (ref 22–32)
Calcium: 8.3 mg/dL — ABNORMAL LOW (ref 8.9–10.3)
Chloride: 103 mmol/L (ref 98–111)
Creatinine, Ser: 0.84 mg/dL (ref 0.61–1.24)
GFR calc Af Amer: >60 mL/min (ref >60)
GFR calc non Af Amer: >60 mL/min (ref >60)
Glucose, Bld: 131 mg/dL — ABNORMAL HIGH (ref 70–99)
Potassium: 3.8 mmol/L (ref 3.5–5.1)
Sodium: 133 mmol/L — ABNORMAL LOW (ref 135–145)
Total Bilirubin: 1.4 mg/dL — ABNORMAL HIGH (ref 0.3–1.2)
Total Protein: 6.1 g/dL — ABNORMAL LOW (ref 6.5–8.1)

## 2020-02-03 LAB — CBC
HCT: 37.1 % — ABNORMAL LOW (ref 39.0–52.0)
Hemoglobin: 12.4 g/dL — ABNORMAL LOW (ref 13.0–17.0)
MCH: 30.2 pg (ref 26.0–34.0)
MCHC: 33.4 g/dL (ref 30.0–36.0)
MCV: 90.3 fL (ref 80.0–100.0)
Platelets: 408 10*3/uL — ABNORMAL HIGH (ref 150–400)
RBC: 4.11 MIL/uL — ABNORMAL LOW (ref 4.22–5.81)
RDW: 12.4 % (ref 11.5–15.5)
WBC: 14.6 10*3/uL — ABNORMAL HIGH (ref 4.0–10.5)
nRBC: 0 % (ref 0.0–0.2)

## 2020-02-03 LAB — MAGNESIUM: Magnesium: 1.6 mg/dL — ABNORMAL LOW (ref 1.7–2.4)

## 2020-02-03 LAB — PHOSPHORUS: Phosphorus: 3.1 mg/dL (ref 2.5–4.6)

## 2020-02-03 MED ORDER — TECHNETIUM TC 99M MEBROFENIN IV KIT
5.3000 | PACK | Freq: Once | INTRAVENOUS | Status: AC | PRN
  Administered 2020-02-03: 5.3 via INTRAVENOUS

## 2020-02-03 MED ORDER — DIPHENHYDRAMINE HCL 25 MG PO CAPS
25.0000 mg | ORAL_CAPSULE | Freq: Every evening | ORAL | Status: DC | PRN
  Administered 2020-02-03: 25 mg via ORAL
  Filled 2020-02-03: qty 1

## 2020-02-03 NOTE — H&P (Signed)
Reason for Consult/Chief Complaint: hyperbilirubbinemia  Adam Fowler is an 64 y.o. male.   HPI: 20M s/p subtotal lap chole. Seen in clinic for follow up appointment 02/01/2020. At that visit, drain output had changed from bilious to purulent in the preceding 24-48h. Labs on the day of clinic visit notable for leukocytosis to 21 (14 on the day of discharge) and hyperbilirubinemia to 2.7 (2.3 on the day of discharge). Planned admission for abx, HIDA, CT A/P, and repeat labs.  Past Medical History:  Diagnosis Date  . Hypertension   . Insomnia   . Kidney stones     Past Surgical History:  Procedure Laterality Date  . ARTHROSCOPIC REPAIR ACL     left, 2012; right 2004  . BILIARY STENT PLACEMENT  01/23/2020   Procedure: BILIARY STENT PLACEMENT;  Surgeon: Irving Copas., MD;  Location: [facility];  Service: Gastroenterology;;  . CHOLECYSTECTOMY N/A 01/24/2020   Procedure: LAPAROSCOPIC CHOLECYSTECTOMY;  Surgeon: Jesusita Oka, MD;  Location: [facility];  Service: General;  Laterality: N/A;  . ERCP N/A 01/21/2020   Procedure: ENDOSCOPIC RETROGRADE CHOLANGIOPANCREATOGRAPHY (ERCP);  Surgeon: Ladene Artist, MD;  Location: [facility] ENDOSCOPY;  Service: Endoscopy;  Laterality: N/A;  . ERCP N/A 01/23/2020   Procedure: ENDOSCOPIC RETROGRADE CHOLANGIOPANCREATOGRAPHY (ERCP);  Surgeon: Irving Copas., MD;  Location: [facility];  Service: Gastroenterology;  Laterality: N/A;  . PANCREATIC STENT PLACEMENT  01/21/2020   Procedure: PANCREATIC STENT PLACEMENT;  Surgeon: Ladene Artist, MD;  Location: [facility];  Service: Endoscopy;;  . PANCREATIC STENT PLACEMENT  01/23/2020   Procedure: PANCREATIC STENT PLACEMENT;  Surgeon: Irving Copas., MD;  Location: [place];  Service: Gastroenterology;;  . [name]  01/23/2020   Procedure: [name];  Surgeon: Irving Copas., MD;  Location: [facility] ENDOSCOPY;  Service: Gastroenterology;;    Family History  Problem  Relation Age of Onset  . Colon cancer Neg Hx   . Stomach cancer Neg Hx     Social History:  reports that he has never smoked. He has never used smokeless tobacco. He reports current alcohol use of about 2.0 standard drinks of alcohol per week. He reports that he does not use drugs.  Allergies: No Known Allergies  Medications: I have reviewed the patient's current medications.  Results for orders placed or performed during the hospital encounter of 02/02/20 (from the past 48 hour(s))  CBC WITH DIFFERENTIAL     Status: Abnormal   Collection Time: 02/02/20  7:02 PM  Result Value Ref Range   WBC 14.9 (H) 4.0 - 10.5 K/uL   RBC 4.20 (L) 4.22 - 5.81 MIL/uL   Hemoglobin 12.8 (L) 13.0 - 17.0 g/dL   HCT 38.2 (L) 39.0 - 52.0 %   MCV 91.0 80.0 - 100.0 fL   MCH 30.5 26.0 - 34.0 pg   MCHC 33.5 30.0 - 36.0 g/dL   RDW 12.6 11.5 - 15.5 %   Platelets 378 150 - 400 K/uL   nRBC 0.0 0.0 - 0.2 %   Neutrophils Relative % 81 %   Neutro Abs 12.2 (H) 1.7 - 7.7 K/uL   Lymphocytes Relative 9 %   Lymphs Abs 1.3 0.7 - 4.0 K/uL   Monocytes Relative 7 %   Monocytes Absolute 1.1 (H) 0.1 - 1.0 K/uL   Eosinophils Relative 2 %   Eosinophils Absolute 0.2 0.0 - 0.5 K/uL   Basophils Relative 0 %   Basophils Absolute 0.1 0.0 - 0.1 K/uL   Immature Granulocytes 1 %  Abs Immature Granulocytes 0.10 (H) 0.00 - 0.07 K/uL    Comment: Performed at [facility], [place] [address] [place], [place] [address]  Comprehensive metabolic panel     Status: Abnormal   Collection Time: 02/02/20  7:02 PM  Result Value Ref Range   Sodium 136 135 - 145 mmol/L   Potassium 4.1 3.5 - 5.1 mmol/L   Chloride 103 98 - 111 mmol/L   CO2 23 22 - 32 mmol/L   Glucose, Bld 104 (H) 70 - 99 mg/dL    Comment: Glucose reference range applies only to samples taken after fasting for at least 8 hours.   BUN 13 8 - 23 mg/dL   Creatinine, Ser 0.79 0.61 - 1.24 mg/dL   Calcium 8.3 (L) 8.9 - 10.3 mg/dL   Total Protein 6.0 (L) 6.5 - 8.1 g/dL    Albumin 2.4 (L) 3.5 - 5.0 g/dL   AST 34 15 - 41 U/L   ALT 33 0 - 44 U/L   Alkaline Phosphatase 124 38 - 126 U/L   Total Bilirubin 1.0 0.3 - 1.2 mg/dL   GFR calc non Af Amer >60 >60 mL/min   GFR calc Af Amer >60 >60 mL/min   Anion gap 10 5 - 15    Comment: Performed at [facility], [place] [address], [place], [place] [address]  Magnesium     Status: None   Collection Time: 02/02/20  7:02 PM  Result Value Ref Range   Magnesium 1.9 1.7 - 2.4 mg/dL    Comment: Performed at [facility], [place] [address] [place], [place] [address]  Phosphorus     Status: None   Collection Time: 02/02/20  7:02 PM  Result Value Ref Range   Phosphorus 3.2 2.5 - 4.6 mg/dL    Comment: Performed at [facility], [place] [address] [place], [place] [address]  Protime-INR     Status: None   Collection Time: 02/02/20  7:02 PM  Result Value Ref Range   Prothrombin Time 14.9 11.4 - 15.2 seconds   INR 1.2 0.8 - 1.2    Comment: (NOTE) INR goal varies based on device and disease states. Performed at [facility], [place] [address], [place],  [address]   CBC WITH DIFFERENTIAL     Status: Abnormal   Collection Time: 02/02/20  9:15 PM  Result Value Ref Range   WBC 14.8 (H) 4.0 - 10.5 K/uL   RBC 4.06 (L) 4.22 - 5.81 MIL/uL   Hemoglobin 12.2 (L) 13.0 - 17.0 g/dL   HCT 36.9 (L) 39.0 - 52.0 %   MCV 90.9 80.0 - 100.0 fL   MCH 30.0 26.0 - 34.0 pg   MCHC 33.1 30.0 - 36.0 g/dL   RDW 12.6 11.5 - 15.5 %   Platelets 391 150 - 400 K/uL   nRBC 0.0 0.0 - 0.2 %   Neutrophils Relative % 83 %   Neutro Abs 12.3 (H) 1.7 - 7.7 K/uL   Lymphocytes Relative 8 %   Lymphs Abs 1.3 0.7 - 4.0 K/uL   Monocytes Relative 6 %   Monocytes Absolute 0.9 0.1 - 1.0 K/uL   Eosinophils Relative 2 %   Eosinophils Absolute 0.2 0.0 - 0.5 K/uL   Basophils Relative 0 %   Basophils Absolute 0.1 0.0 - 0.1 K/uL   Immature Granulocytes 1 %   Abs Immature Granulocytes 0.13 (H) 0.00 - 0.07 K/uL    Comment: Performed at [facility], [place] [address] [place], [place] [address]  Comprehensive metabolic panel     Status: Abnormal   Collection Time: 02/02/20  9:15 PM  Result Value Ref Range   Sodium 136 135 - 145 mmol/L   Potassium 3.7 3.5 - 5.1 mmol/L   Chloride 102 98 - 111 mmol/L   CO2 24 22 - 32 mmol/L   Glucose, Bld 115 (H) 70 - 99 mg/dL    Comment: Glucose reference range applies only to samples taken after fasting for at least 8 hours.   BUN 12 8 - 23 mg/dL   Creatinine, Ser 0.76 0.61 - 1.24 mg/dL   Calcium 8.2 (L) 8.9 - 10.3 mg/dL   Total Protein 6.0 (L) 6.5 - 8.1 g/dL   Albumin 2.4 (L) 3.5 - 5.0 g/dL   AST 37 15 - 41 U/L   ALT 35 0 - 44 U/L   Alkaline Phosphatase 123 38 - 126 U/L   Total Bilirubin 1.3 (H) 0.3 - 1.2 mg/dL   GFR calc non Af Amer >60 >60 mL/min   GFR calc Af Amer >60 >60 mL/min   Anion gap 10 5 - 15    Comment: Performed at [facility], [place] [address], [place], [place] [address]  Magnesium     Status: None   Collection Time: 02/02/20  9:15 PM  Result Value Ref Range   Magnesium 1.7 1.7 - 2.4 mg/dL    Comment: Performed at [facility], [place] [address] [place], [place] [address]  Phosphorus     Status: None   Collection Time: 02/02/20  9:15 PM  Result Value Ref Range   Phosphorus 2.9 2.5 - 4.6 mg/dL    Comment: Performed at [facility] [address], [place], [place] [address]  Protime-INR     Status: None   Collection Time: 02/02/20  9:15 PM  Result Value Ref Range   Prothrombin Time 13.6 11.4 - 15.2 seconds   INR 1.1 0.8 - 1.2    Comment: (NOTE) INR goal varies based on device and disease states. Performed at [facility], [place] [address] [place], [place] [address]   CBC     Status: Abnormal   Collection Time: 02/03/20  3:11 AM  Result Value Ref Range   WBC 14.6 (H) 4.0 - 10.5 K/uL   RBC 4.11 (L) 4.22 - 5.81 MIL/uL   Hemoglobin 12.4 (L) 13.0 - 17.0 g/dL   HCT 37.1 (L) 39.0 - 52.0 %   MCV 90.3 80.0 - 100.0 fL   MCH 30.2 26.0 - 34.0 pg     MCHC 33.4 30.0 - 36.0 g/dL   RDW 12.4 11.5 - 15.5 %   Platelets 408 (H) 150 - 400 K/uL   nRBC 0.0 0.0 - 0.2 %    Comment: Performed at [facility], [place] [address], [place], [place] [address]  Comprehensive metabolic panel     Status: Abnormal   Collection Time: 02/03/20  3:11 AM  Result Value Ref Range   Sodium 133 (L) 135 - 145 mmol/L   Potassium 3.8 3.5 - 5.1 mmol/L   Chloride 103 98 - 111 mmol/L   CO2 22 22 - 32 mmol/L   Glucose, Bld 131 (H) 70 - 99 mg/dL    Comment: Glucose reference range applies only to samples taken after fasting for at least 8 hours.   BUN 10 8 - 23 mg/dL   Creatinine, Ser 0.84 0.61 -  1.24 mg/dL   Calcium 8.3 (L) 8.9 - 10.3 mg/dL   Total Protein 6.1 (L) 6.5 - 8.1 g/dL   Albumin 2.4 (L) 3.5 - 5.0 g/dL   AST 35 15 - 41 U/L   ALT 35 0 - 44 U/L   Alkaline Phosphatase 111 38 - 126 U/L   Total Bilirubin 1.4 (H) 0.3 - 1.2 mg/dL   GFR calc non Af Amer >60 >60 mL/min   GFR calc Af Amer >60 >60 mL/min   Anion gap 8 5 - 15    Comment: Performed at [facility] [address] [place], [place] [address]  Magnesium     Status: Abnormal   Collection Time: 02/03/20  3:11 AM  Result Value Ref Range   Magnesium 1.6 (L) 1.7 - 2.4 mg/dL    Comment: Performed at [facility] [address] [place], [place] [address]  Phosphorus     Status: None   Collection Time: 02/03/20  3:11 AM  Result Value Ref Range   Phosphorus 3.1 2.5 - 4.6 mg/dL    Comment: Performed at [facility] [address], [place],  [address]    CT ABDOMEN PELVIS W CONTRAST  Result Date: 02/02/2020 CLINICAL DATA:  Intra-abdominal abscess. EXAM: CT ABDOMEN AND PELVIS WITH CONTRAST TECHNIQUE: Multidetector CT imaging of the abdomen and pelvis was performed using the standard protocol following bolus administration of intravenous contrast. CONTRAST:  175mL OMNIPAQUE IOHEXOL 300 MG/ML  SOLN COMPARISON:  01/20/2020 FINDINGS: Lower chest: There are small bilateral pleural  effusions, right greater than left. There is bibasilar atelectasis, right worse than left.The heart size is normal. Hepatobiliary: The liver is normal. There is a complex collection in the gallbladder fossa measuring 8.6 x 5.1 cm. A surgical drain courses through this collection.There are pockets of gas within this collection. There is low attenuation material along the course of the surgical drain. This may represent surgical packing material. Pancreas: A plastic pancreatic duct stent is noted. Spleen: The spleen is enlarged measuring 13 cm craniocaudad. Adrenals/Urinary Tract: --Adrenal glands: Unremarkable. --Right kidney/ureter: Small nonobstructing stones are noted in the right kidney. --Left kidney/ureter: Small nonobstructing stones are noted in the upper pole the left kidney. --Urinary bladder: Unremarkable. Stomach/Bowel: --Stomach/Duodenum: No hiatal hernia or other gastric abnormality. Normal duodenal course and caliber. --Small bowel: Unremarkable. --Colon: There is scattered colonic diverticula without CT evidence for diverticulitis. There is some mild wall thickening of the hepatic flexure of the colon, favored to be reactive. --Appendix: Normal. Vascular/Lymphatic: Atherosclerotic calcification is present within the non-aneurysmal abdominal aorta, without hemodynamically significant stenosis. --No retroperitoneal lymphadenopathy. --No mesenteric lymphadenopathy. --No pelvic or inguinal lymphadenopathy. Reproductive: Unremarkable Other: No ascites or free air. The abdominal wall is normal. Musculoskeletal. No acute displaced fractures. IMPRESSION: 1. There is a complex 8.6 cm collection in the gallbladder fossa. This is likely related to the patient's reported subtotal cholecystectomy. Is unclear how much of this collection represents abscess or residual gallbladder. A surgical drain courses through the gallbladder fossa in likely through the residual gallbladder itself. There is low attenuation  material surrounding the surgical drain within the gallbladder fossa. This is favored to represent surgical packing material and should be correlated with the patient's surgical history. There are persistent inflammatory changes in the right upper quadrant. 2. Small bilateral pleural effusions, right greater than left, with bibasilar atelectasis, right worse than left. 3. Mild wall thickening of the hepatic flexure of the colon, favored to be reactive. 4. Splenomegaly.  5. Bilateral nonobstructive nephrolithiasis. 6. Pancreatic stent in place. Aortic Atherosclerosis (ICD10-I70.0). Electronically Signed   By: Constance Holster M.D.   On: 02/02/2020 23:51    ROS 10 point review of systems is negative except as listed above in HPI.   Physical Exam Blood pressure 130/85, pulse 67, temperature 98.1 F (36.7 C), temperature source Oral, resp. rate 18, SpO2 96 %. Constitutional: well-developed, well-nourished HEENT: pupils equal, round, reactive to light, 30mm b/l, moist conjunctiva, external inspection of ears and nose normal, hearing intact Oropharynx: normal oropharyngeal mucosa, normal dentition Neck: no thyromegaly, trachea midline, no midline cervical tenderness to palpation Chest: breath sounds equal bilaterally, normal respiratory effort, no midline or lateral chest wall tenderness to palpation/deformity Abdomen: soft, NT, no bruising, no hepatosplenomegaly GU: no blood at urethral meatus of penis, no scrotal masses or abnormality  Back: no wounds, no thoracic/lumbar spine tenderness to palpation, no thoracic/lumbar spine stepoffs Rectal: deferred Extremities: 2+ radial and pedal pulses bilaterally, motor and sensation intact to bilateral UE and LE, no peripheral edema MSK: normal gait/station, no clubbing/cyanosis of fingers/toes, normal ROM of all four extremities Skin: warm, dry, no rashes Psych: normal memory, normal mood/affect    Assessment/Plan:  8M s/p lap subtotal cholecystectomy  for gangrenous cholecystitis with worsening leukocytosis and hyperbilirubinemia one week after discharge on outpatient labs.   Hyperbilirubinemia - improved since outpatient labs. HIDA scan today. Repeat labs in AM. Leukocytosis - improved since outpatient labs, CT A/P with fluid collection and surgical drain well-positioned. No additional intervention planned. Repeat labs in AM FEN - NPO, diet post-imaging after determination of any procedural needs DVT - SCDs, LMWH Dispo - med-surg, potentially home in AM if HIDA negative and repeat labs improved.    Jesusita Oka, MD General and [facility]

## 2020-02-04 ENCOUNTER — Other Ambulatory Visit: Payer: Self-pay

## 2020-02-04 LAB — COMPREHENSIVE METABOLIC PANEL
ALT: 33 U/L (ref 0–44)
AST: 28 U/L (ref 15–41)
Albumin: 2.2 g/dL — ABNORMAL LOW (ref 3.5–5.0)
Alkaline Phosphatase: 97 U/L (ref 38–126)
Anion gap: 12 (ref 5–15)
BUN: 12 mg/dL (ref 8–23)
CO2: 21 mmol/L — ABNORMAL LOW (ref 22–32)
Calcium: 8.4 mg/dL — ABNORMAL LOW (ref 8.9–10.3)
Chloride: 106 mmol/L (ref 98–111)
Creatinine, Ser: 0.79 mg/dL (ref 0.61–1.24)
GFR calc Af Amer: >60 mL/min (ref >60)
GFR calc non Af Amer: >60 mL/min (ref >60)
Glucose, Bld: 130 mg/dL — ABNORMAL HIGH (ref 70–99)
Potassium: 3.6 mmol/L (ref 3.5–5.1)
Sodium: 139 mmol/L (ref 135–145)
Total Bilirubin: 0.5 mg/dL (ref 0.3–1.2)
Total Protein: 5.8 g/dL — ABNORMAL LOW (ref 6.5–8.1)

## 2020-02-04 LAB — CBC
HCT: 35.6 % — ABNORMAL LOW (ref 39.0–52.0)
Hemoglobin: 11.9 g/dL — ABNORMAL LOW (ref 13.0–17.0)
MCH: 29.9 pg (ref 26.0–34.0)
MCHC: 33.4 g/dL (ref 30.0–36.0)
MCV: 89.4 fL (ref 80.0–100.0)
Platelets: 376 10*3/uL (ref 150–400)
RBC: 3.98 MIL/uL — ABNORMAL LOW (ref 4.22–5.81)
RDW: 12.4 % (ref 11.5–15.5)
WBC: 9.9 10*3/uL (ref 4.0–10.5)
nRBC: 0 % (ref 0.0–0.2)

## 2020-02-04 NOTE — Plan of Care (Signed)

## 2020-02-04 NOTE — Progress Notes (Signed)
Nsg Discharge Note  Admit Date:  02/02/2020 Discharge date: 02/04/2020   Adam Fowler to be D/C'd  per MD order.  AVS completed.Patient/caregiver able to verbalize understanding.  Discharge Medication: Allergies as of 02/04/2020   No Known Allergies     Medication List    TAKE these medications   aspirin EC 81 MG tablet Take 81 mg by mouth 2 (two) times daily.   CALCIUM 1000 + D PO Take 1 tablet by mouth daily.   CoQ10 400 MG Caps Take 800 mg by mouth daily.   dicyclomine 10 MG capsule Commonly known as: BENTYL Take 10 mg by mouth every 6 (six) hours as needed for spasms.   GLUCOSAMINE-CHONDROITIN PO Take 1 tablet by mouth 2 (two) times daily.   Krill Oil 1000 MG Caps Take 1,000-2,000 mg by mouth See admin instructions. Take one capsule (1000 mg) by mouth every morning and two capsules (2000 mg) at bedtime   MORINGA PO Take 1,000 mg by mouth 2 (two) times daily.   multivitamin with minerals Tabs tablet Take 1 tablet by mouth daily.   oxyCODONE 5 MG immediate release tablet Commonly known as: Oxy IR/ROXICODONE Take 1 tablet (5 mg total) by mouth every 6 (six) hours as needed for moderate pain or severe pain (pain not releived by tylenol or ibuprofen).   Red Yeast Rice 600 MG Caps Take 1,800 mg by mouth at bedtime.   sertraline 50 MG tablet Commonly known as: ZOLOFT Take 50 mg by mouth daily. Notes to patient: Next dose 02/05/20   sertraline 100 MG tablet Commonly known as: ZOLOFT Take 100 mg by mouth daily.   TURMERIC PO Take 1,500 mg by mouth 2 (two) times daily.   Vitamin B-12 5000 MCG Tbdp Take 5,000 mcg by mouth daily.   zolpidem 5 MG tablet Commonly known as: AMBIEN Take 5 mg by mouth at bedtime.       Discharge Assessment: Vitals:   02/03/20 2028 02/04/20 0407  BP: 138/78 135/85  Pulse: 79 65  Resp:  18  Temp: 98.7 F (37.1 C) 98.1 F (36.7 C)  SpO2: 94% 95%   Skin clean, dry and intact without evidence of skin break down, no evidence  of skin tears noted. IV catheter discontinued intact. Site without signs and symptoms of complications - no redness or edema noted at insertion site, patient denies c/o pain - only slight tenderness at site.  Dressing with slight pressure applied.  D/c Instructions-Education: Discharge instructions given to patient/family with verbalized understanding. D/c education completed with patient/family including follow up instructions, medication list, d/c activities limitations if indicated, with other d/c instructions as indicated by MD - patient able to verbalize understanding, all questions fully answered. Patient instructed to return to ED, call 911, or call MD for any changes in condition.  Patient escorted via [place], and D/C home via private auto.  Adam Fowler, Adam Schimke, RN 02/04/2020 9:20 AM

## 2020-02-04 NOTE — Plan of Care (Signed)
  Problem: Education: Goal: Knowledge of General Education information will improve Description: Including pain rating scale, medication(s)/side effects and non-pharmacologic comfort measures 02/04/2020 0903 by Fama Muenchow, Godfrey Pick, RN Outcome: Adequate for Discharge 02/04/2020 0901 by Shardea Cwynar, Godfrey Pick, RN Outcome: Adequate for Discharge   Problem: Health Behavior/Discharge Planning: Goal: Ability to manage health-related needs will improve 02/04/2020 0903 by Ludwin Flahive, Godfrey Pick, RN Outcome: Adequate for Discharge 02/04/2020 0901 by Angele Wiemann, Godfrey Pick, RN Outcome: Adequate for Discharge   Problem: Clinical Measurements: Goal: Ability to maintain clinical measurements within normal limits will improve 02/04/2020 0903 by Abella Shugart, Godfrey Pick, RN Outcome: Adequate for Discharge 02/04/2020 0901 by Palyn Scrima, Godfrey Pick, RN Outcome: Adequate for Discharge Goal: Will remain free from infection 02/04/2020 0903 by Kierah Goatley, Godfrey Pick, RN Outcome: Adequate for Discharge 02/04/2020 0901 by Laketha Leopard, Godfrey Pick, RN Outcome: Adequate for Discharge Goal: Diagnostic test results will improve 02/04/2020 0903 by Yaritzy Huser, Godfrey Pick, RN Outcome: Adequate for Discharge 02/04/2020 0901 by Zyheir Daft, Godfrey Pick, RN Outcome: Adequate for Discharge Goal: Respiratory complications will improve 02/04/2020 0903 by Reena Borromeo, Godfrey Pick, RN Outcome: Adequate for Discharge 02/04/2020 0901 by Jonavin Seder, Godfrey Pick, RN Outcome: Adequate for Discharge Goal: Cardiovascular complication will be avoided 02/04/2020 [id number] by Eron Goble, Godfrey Pick, RN Outcome: Adequate for Discharge 02/04/2020 0901 by Bernal Luhman, Godfrey Pick, RN Outcome: Adequate for Discharge   Problem: Activity: Goal: Risk for activity intolerance will decrease 02/04/2020 0903 by Dareion Kneece, Godfrey Pick, RN Outcome: Adequate for Discharge 02/04/2020 0901 by Guerin Lashomb, Godfrey Pick, RN Outcome: Adequate for Discharge   Problem: Nutrition: Goal: Adequate nutrition will be maintained 02/04/2020 0903 by  Rana Adorno, Godfrey Pick, RN Outcome: Adequate for Discharge 02/04/2020 0901 by Fabian Walder, Godfrey Pick, RN Outcome: Adequate for Discharge   Problem: Coping: Goal: Level of anxiety will decrease 02/04/2020 0903 by Denean Pavon, Godfrey Pick, RN Outcome: Adequate for Discharge 02/04/2020 0901 by Rex Magee, Godfrey Pick, RN Outcome: Adequate for Discharge   Problem: Elimination: Goal: Will not experience complications related to bowel motility 02/04/2020 0903 by Taiana Temkin, Godfrey Pick, RN Outcome: Adequate for Discharge 02/04/2020 0901 by Vergia Chea, Godfrey Pick, RN Outcome: Adequate for Discharge Goal: Will not experience complications related to urinary retention 02/04/2020 0903 by Dimitry Holsworth, Godfrey Pick, RN Outcome: Adequate for Discharge 02/04/2020 0901 by Shatasia Cutshaw, Godfrey Pick, RN Outcome: Adequate for Discharge   Problem: Pain Managment: Goal: General experience of comfort will improve 02/04/2020 0903 by Kinlie Janice, Godfrey Pick, RN Outcome: Adequate for Discharge 02/04/2020 0901 by Neven Fina, Godfrey Pick, RN Outcome: Adequate for Discharge   Problem: Safety: Goal: Ability to remain free from injury will improve 02/04/2020 0903 by Khyran Riera, Godfrey Pick, RN Outcome: Adequate for Discharge 02/04/2020 0901 by Jaiceon Collister, Godfrey Pick, RN Outcome: Adequate for Discharge   Problem: Skin Integrity: Goal: Risk for impaired skin integrity will decrease 02/04/2020 0903 by Kala Gassmann, Godfrey Pick, RN Outcome: Adequate for Discharge 02/04/2020 0901 by Riva Sesma, Godfrey Pick, RN Outcome: Adequate for Discharge

## 2020-02-08 ENCOUNTER — Other Ambulatory Visit (HOSPITAL_COMMUNITY): Payer: Self-pay | Admitting: Student

## 2020-02-08 ENCOUNTER — Other Ambulatory Visit: Payer: Self-pay | Admitting: Student

## 2020-02-08 DIAGNOSIS — Z9049 Acquired absence of other specified parts of digestive tract: Secondary | ICD-10-CM

## 2020-02-08 NOTE — Discharge Summary (Signed)
    Patient ID: Adam Fowler [id number] 1956-01-21 64 y.o.  Admit date: 02/02/2020 Discharge date: 02/08/2020  Admitting Diagnosis: hyperbilirubinemia  Discharge Diagnosis Patient Active Problem List   Diagnosis Date Noted  . Hyperbilirubinemia 02/02/2020  . Pancreatitis 01/21/2020  . Bacteremia   . Elevated LFTs   . Jaundice   . Special screening for malignant neoplasms, colon 07/09/2011  . Diverticulosis of colon (without mention of hemorrhage) 07/09/2011  . 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 Seen in clinic for follow up appointment 02/01/2020. At that visit, drain output had changed from bilious to purulent in the preceding 24-48h. Labs on the day of clinic visit notable for leukocytosis to 21 (14 on the day of discharge) and hyperbilirubinemia to 2.7 (2.3 on the day of discharge). Planned admission for abx, HIDA, CT A/P, and repeat labs.  Procedures none  Hospital Course:  Uneventful. Labs normalized, imaging negative for bile leak and well positioned surgical drain. Tolerating diet, pain controlled, voiding, ambulating.    Physical Exam: Gen: comfortable, no distress Neuro: non-focal exam HEENT: PERRL Neck: supple CV: RRR Pulm: unlabored breathing Abd: soft, NT, JP on R  GU: clear yellow urine Extr: wwp, no edema   Allergies as of 02/04/2020   No Known Allergies     Medication List    TAKE these medications   aspirin EC 81 MG tablet Take 81 mg by mouth 2 (two) times daily.   CALCIUM 1000 + D PO Take 1 tablet by mouth daily.   CoQ10 400 MG Caps Take 800 mg by mouth daily.   dicyclomine 10 MG capsule Commonly known as: BENTYL Take 10 mg by mouth every 6 (six) hours as needed for spasms.   GLUCOSAMINE-CHONDROITIN PO Take 1 tablet by mouth 2 (two) times daily.   Krill Oil 1000 MG Caps Take 1,000-2,000 mg by mouth See admin instructions. Take one capsule (1000 mg) by mouth every morning  and two capsules (2000 mg) at bedtime   MORINGA PO Take 1,000 mg by mouth 2 (two) times daily.   multivitamin with minerals Tabs tablet Take 1 tablet by mouth daily.   oxyCODONE 5 MG immediate release tablet Commonly known as: Oxy IR/ROXICODONE Take 1 tablet (5 mg total) by mouth every 6 (six) hours as needed for moderate pain or severe pain (pain not releived by tylenol or ibuprofen).   Red Yeast Rice 600 MG Caps Take 1,800 mg by mouth at bedtime.   sertraline 50 MG tablet Commonly known as: ZOLOFT Take 50 mg by mouth daily. Notes to patient: Next dose 02/05/20   sertraline 100 MG tablet Commonly known as: ZOLOFT Take 100 mg by mouth daily.   TURMERIC PO Take 1,500 mg by mouth 2 (two) times daily.   Vitamin B-12 5000 MCG Tbdp Take 5,000 mcg by mouth daily.   zolpidem 5 MG tablet Commonly known as: AMBIEN Take 5 mg by mouth at bedtime.           Signed: Jesusita Oka, [facility] 02/08/2020, 7:34 AM

## 2020-02-13 ENCOUNTER — Inpatient Hospital Stay (HOSPITAL_COMMUNITY)
Admission: EM | Admit: 2020-02-13 | Discharge: 2020-02-14 | DRG: 862 | Disposition: A | Discharge status: Home / Self Care | Payer: 59 | Attending: General Surgery | Admitting: General Surgery

## 2020-02-13 ENCOUNTER — Inpatient Hospital Stay (HOSPITAL_COMMUNITY): Payer: 59

## 2020-02-13 ENCOUNTER — Emergency Department (HOSPITAL_COMMUNITY): Payer: 59

## 2020-02-13 ENCOUNTER — Encounter (HOSPITAL_COMMUNITY): Payer: Self-pay

## 2020-02-13 ENCOUNTER — Other Ambulatory Visit: Payer: Self-pay

## 2020-02-13 DIAGNOSIS — Z79899 Other long term (current) drug therapy: Secondary | ICD-10-CM

## 2020-02-13 DIAGNOSIS — K5730 Diverticulosis of large intestine without perforation or abscess without bleeding: Secondary | ICD-10-CM | POA: Diagnosis present

## 2020-02-13 DIAGNOSIS — Y838 Other surgical procedures as the cause of abnormal reaction of the patient, or of later complication, without mention of misadventure at the time of the procedure: Secondary | ICD-10-CM | POA: Diagnosis present

## 2020-02-13 DIAGNOSIS — Z7982 Long term (current) use of aspirin: Secondary | ICD-10-CM | POA: Diagnosis not present

## 2020-02-13 DIAGNOSIS — Z9049 Acquired absence of other specified parts of digestive tract: Secondary | ICD-10-CM | POA: Diagnosis not present

## 2020-02-13 DIAGNOSIS — R7303 Prediabetes: Secondary | ICD-10-CM | POA: Diagnosis present

## 2020-02-13 DIAGNOSIS — T8143XA Infection following a procedure, organ and space surgical site, initial encounter: Principal | ICD-10-CM | POA: Diagnosis present

## 2020-02-13 DIAGNOSIS — Z87442 Personal history of urinary calculi: Secondary | ICD-10-CM

## 2020-02-13 DIAGNOSIS — I10 Essential (primary) hypertension: Secondary | ICD-10-CM | POA: Diagnosis present

## 2020-02-13 DIAGNOSIS — G4700 Insomnia, unspecified: Secondary | ICD-10-CM | POA: Diagnosis present

## 2020-02-13 DIAGNOSIS — L0291 Cutaneous abscess, unspecified: Secondary | ICD-10-CM

## 2020-02-13 DIAGNOSIS — R739 Hyperglycemia, unspecified: Secondary | ICD-10-CM | POA: Diagnosis present

## 2020-02-13 DIAGNOSIS — Z20822 Contact with and (suspected) exposure to covid-19: Secondary | ICD-10-CM | POA: Diagnosis present

## 2020-02-13 DIAGNOSIS — K651 Peritoneal abscess: Secondary | ICD-10-CM | POA: Diagnosis present

## 2020-02-13 LAB — CBC WITH DIFFERENTIAL/PLATELET
Abs Immature Granulocytes: 0.51 10*3/uL — ABNORMAL HIGH (ref 0.00–0.07)
Basophils Absolute: 0.1 10*3/uL (ref 0.0–0.1)
Basophils Relative: 0 %
Eosinophils Absolute: 0.2 10*3/uL (ref 0.0–0.5)
Eosinophils Relative: 1 %
HCT: 42.4 % (ref 39.0–52.0)
Hemoglobin: 13.8 g/dL (ref 13.0–17.0)
Immature Granulocytes: 2 %
Lymphocytes Relative: 4 %
Lymphs Abs: 1.1 10*3/uL (ref 0.7–4.0)
MCH: 28.8 pg (ref 26.0–34.0)
MCHC: 32.5 g/dL (ref 30.0–36.0)
MCV: 88.5 fL (ref 80.0–100.0)
Monocytes Absolute: 1.4 10*3/uL — ABNORMAL HIGH (ref 0.1–1.0)
Monocytes Relative: 6 %
Neutro Abs: 22.6 10*3/uL — ABNORMAL HIGH (ref 1.7–7.7)
Neutrophils Relative %: 87 %
Platelets: 294 10*3/uL (ref 150–400)
RBC: 4.79 MIL/uL (ref 4.22–5.81)
RDW: 12.7 % (ref 11.5–15.5)
WBC: 25.8 10*3/uL — ABNORMAL HIGH (ref 4.0–10.5)
nRBC: 0 % (ref 0.0–0.2)

## 2020-02-13 LAB — COMPREHENSIVE METABOLIC PANEL
ALT: 27 U/L (ref 0–44)
AST: 20 U/L (ref 15–41)
Albumin: 2.7 g/dL — ABNORMAL LOW (ref 3.5–5.0)
Alkaline Phosphatase: 119 U/L (ref 38–126)
Anion gap: 10 (ref 5–15)
BUN: 22 mg/dL (ref 8–23)
CO2: 21 mmol/L — ABNORMAL LOW (ref 22–32)
Calcium: 9.1 mg/dL (ref 8.9–10.3)
Chloride: 100 mmol/L (ref 98–111)
Creatinine, Ser: 1 mg/dL (ref 0.61–1.24)
GFR calc Af Amer: >60 mL/min (ref >60)
GFR calc non Af Amer: >60 mL/min (ref >60)
Glucose, Bld: 237 mg/dL — ABNORMAL HIGH (ref 70–99)
Potassium: 4 mmol/L (ref 3.5–5.1)
Sodium: 131 mmol/L — ABNORMAL LOW (ref 135–145)
Total Bilirubin: 1.3 mg/dL — ABNORMAL HIGH (ref 0.3–1.2)
Total Protein: 6.8 g/dL (ref 6.5–8.1)

## 2020-02-13 LAB — TROPONIN I (HIGH SENSITIVITY)
Troponin I (High Sensitivity): 7 ng/L (ref <18)
Troponin I (High Sensitivity): 6 ng/L (ref <18)

## 2020-02-13 LAB — SARS CORONAVIRUS 2 BY RT PCR (HOSPITAL ORDER, PERFORMED IN ~~LOC~~ HOSPITAL LAB): SARS Coronavirus 2: NEGATIVE

## 2020-02-13 LAB — LIPASE, BLOOD: Lipase: 55 U/L — ABNORMAL HIGH (ref 11–51)

## 2020-02-13 LAB — HEMOGLOBIN A1C
Hgb A1c MFr Bld: 6 % — ABNORMAL HIGH (ref 4.8–5.6)
Mean Plasma Glucose: 125.5 mg/dL

## 2020-02-13 LAB — CBG MONITORING, ED: Glucose-Capillary: 91 mg/dL (ref 70–99)

## 2020-02-13 MED ORDER — OXYCODONE HCL 5 MG PO TABS
5.0000 mg | ORAL_TABLET | ORAL | Status: DC | PRN
  Administered 2020-02-13: 10 mg via ORAL
  Filled 2020-02-13: qty 2

## 2020-02-13 MED ORDER — IBUPROFEN 400 MG PO TABS: 600.0000 mg | ORAL_TABLET | Freq: Four times a day (QID) | ORAL | Status: DC | PRN

## 2020-02-13 MED ORDER — FENTANYL CITRATE (PF) 100 MCG/2ML IJ SOLN
INTRAMUSCULAR | Status: AC
  Filled 2020-02-13: qty 2

## 2020-02-13 MED ORDER — ONDANSETRON 4 MG PO TBDP: 4.0000 mg | ORAL_TABLET | Freq: Four times a day (QID) | ORAL | Status: DC | PRN

## 2020-02-13 MED ORDER — PIPERACILLIN-TAZOBACTAM 3.375 G IVPB
3.3750 g | Freq: Three times a day (TID) | INTRAVENOUS | Status: DC
  Administered 2020-02-13 – 2020-02-14 (×2): 3.375 g via INTRAVENOUS
  Filled 2020-02-13 (×2): qty 50

## 2020-02-13 MED ORDER — DOCUSATE SODIUM 100 MG PO CAPS
100.0000 mg | ORAL_CAPSULE | Freq: Two times a day (BID) | ORAL | Status: DC
  Administered 2020-02-13 – 2020-02-14 (×2): 100 mg via ORAL
  Filled 2020-02-13 (×2): qty 1

## 2020-02-13 MED ORDER — METOPROLOL TARTRATE 5 MG/5ML IV SOLN: 5.0000 mg | Freq: Four times a day (QID) | INTRAVENOUS | Status: DC | PRN

## 2020-02-13 MED ORDER — PIPERACILLIN-TAZOBACTAM 3.375 G IVPB
3.3750 g | Freq: Once | INTRAVENOUS | Status: AC
  Administered 2020-02-13: 3.375 g via INTRAVENOUS
  Filled 2020-02-13: qty 50

## 2020-02-13 MED ORDER — ZOLPIDEM TARTRATE 5 MG PO TABS
5.0000 mg | ORAL_TABLET | Freq: Every day | ORAL | Status: DC
  Administered 2020-02-13: 5 mg via ORAL
  Filled 2020-02-13: qty 1

## 2020-02-13 MED ORDER — METHOCARBAMOL 500 MG PO TABS
1000.0000 mg | ORAL_TABLET | Freq: Three times a day (TID) | ORAL | Status: DC
  Administered 2020-02-13 – 2020-02-14 (×3): 1000 mg via ORAL
  Filled 2020-02-13 (×3): qty 2

## 2020-02-13 MED ORDER — INSULIN ASPART 100 UNIT/ML ~~LOC~~ SOLN: 0.0000 [IU] | Freq: Three times a day (TID) | SUBCUTANEOUS | Status: DC

## 2020-02-13 MED ORDER — ENOXAPARIN SODIUM 40 MG/0.4ML ~~LOC~~ SOLN
40.0000 mg | SUBCUTANEOUS | Status: DC
  Administered 2020-02-13: 40 mg via SUBCUTANEOUS
  Filled 2020-02-13: qty 0.4

## 2020-02-13 MED ORDER — KETOROLAC TROMETHAMINE 10 MG PO TABS
10.0000 mg | ORAL_TABLET | Freq: Four times a day (QID) | ORAL | Status: DC
  Administered 2020-02-13 – 2020-02-14 (×3): 10 mg via ORAL
  Filled 2020-02-13 (×5): qty 1

## 2020-02-13 MED ORDER — SODIUM CHLORIDE 0.9 % IV BOLUS
1000.0000 mL | Freq: Once | INTRAVENOUS | Status: AC
  Administered 2020-02-13: 1000 mL via INTRAVENOUS

## 2020-02-13 MED ORDER — MORPHINE SULFATE (PF) 2 MG/ML IV SOLN: 2.0000 mg | INTRAVENOUS | Status: DC | PRN

## 2020-02-13 MED ORDER — MIDAZOLAM HCL 2 MG/2ML IJ SOLN
INTRAMUSCULAR | Status: AC
  Filled 2020-02-13: qty 2

## 2020-02-13 MED ORDER — FENTANYL CITRATE (PF) 100 MCG/2ML IJ SOLN
INTRAMUSCULAR | Status: AC | PRN
  Administered 2020-02-13: 50 ug via INTRAVENOUS

## 2020-02-13 MED ORDER — POLYETHYLENE GLYCOL 3350 17 G PO PACK: 17.0000 g | PACK | Freq: Every day | ORAL | Status: DC | PRN

## 2020-02-13 MED ORDER — METHOCARBAMOL 500 MG PO TABS: 500.0000 mg | ORAL_TABLET | Freq: Four times a day (QID) | ORAL | Status: DC | PRN

## 2020-02-13 MED ORDER — ACETAMINOPHEN 500 MG PO TABS
1000.0000 mg | ORAL_TABLET | Freq: Four times a day (QID) | ORAL | Status: DC
  Administered 2020-02-13 – 2020-02-14 (×3): 1000 mg via ORAL
  Filled 2020-02-13 (×3): qty 2

## 2020-02-13 MED ORDER — SERTRALINE HCL 100 MG PO TABS
100.0000 mg | ORAL_TABLET | Freq: Every day | ORAL | Status: DC
  Administered 2020-02-13 – 2020-02-14 (×2): 100 mg via ORAL
  Filled 2020-02-13 (×2): qty 1

## 2020-02-13 MED ORDER — PANTOPRAZOLE SODIUM 40 MG IV SOLR: 40.0000 mg | Freq: Every day | INTRAVENOUS | Status: DC

## 2020-02-13 MED ORDER — SIMETHICONE 80 MG PO CHEW
40.0000 mg | CHEWABLE_TABLET | Freq: Four times a day (QID) | ORAL | Status: DC | PRN
  Filled 2020-02-13: qty 1

## 2020-02-13 MED ORDER — SODIUM CHLORIDE 0.9 % IV BOLUS: 1000.0000 mL | Freq: Once | INTRAVENOUS | Status: DC

## 2020-02-13 MED ORDER — SODIUM CHLORIDE 0.9% FLUSH
5.0000 mL | Freq: Three times a day (TID) | INTRAVENOUS | Status: DC
  Administered 2020-02-13 – 2020-02-14 (×3): 5 mL

## 2020-02-13 MED ORDER — IOHEXOL 300 MG/ML  SOLN
100.0000 mL | Freq: Once | INTRAMUSCULAR | Status: AC | PRN
  Administered 2020-02-13: 100 mL via INTRAVENOUS

## 2020-02-13 MED ORDER — MIDAZOLAM HCL 2 MG/2ML IJ SOLN
INTRAMUSCULAR | Status: AC | PRN
  Administered 2020-02-13: 1 mg via INTRAVENOUS

## 2020-02-13 MED ORDER — ONDANSETRON HCL 4 MG/2ML IJ SOLN: 4.0000 mg | Freq: Four times a day (QID) | INTRAMUSCULAR | Status: DC | PRN

## 2020-02-13 MED ORDER — DIPHENHYDRAMINE HCL 50 MG/ML IJ SOLN: 12.5000 mg | Freq: Four times a day (QID) | INTRAMUSCULAR | Status: DC | PRN

## 2020-02-13 MED ORDER — LIDOCAINE HCL 1 % IJ SOLN
INTRAMUSCULAR | Status: AC
  Filled 2020-02-13: qty 20

## 2020-02-13 MED ORDER — SODIUM CHLORIDE 0.9 % IV SOLN: INTRAVENOUS | Status: DC

## 2020-02-13 MED ORDER — DIPHENHYDRAMINE HCL 12.5 MG/5ML PO ELIX
12.5000 mg | ORAL_SOLUTION | Freq: Four times a day (QID) | ORAL | Status: DC | PRN
  Administered 2020-02-14: 12.5 mg via ORAL
  Filled 2020-02-13: qty 10

## 2020-02-13 NOTE — Procedures (Signed)
Interventional Radiology Procedure Note  Procedure: Ct Gb fossa abscess drain  Complications: None  Estimated Blood Loss: min  Findings: 40cc purulent fld aspirated cx sent

## 2020-02-13 NOTE — H&P (Signed)
Adam Fowler August 27, 1956  [id number].    Requesting MD: Dr. Stark Jock Chief Complaint/Reason for Consult: GB Fossa Abscess   HPI: Adam Fowler is a 64 y.o. male who presented to [facility] today with back pain, fatigue and low BP.  Patient was originally admitted on 5/15 for gallstone pancreatitis.  Patient underwent ERCP x2 during admission.  There was difficult cannulation during ERCP and ultimately a wire was able to be placed into the CBD to allow cannulation and a biliary sphincterotomy was performed.  Patient underwent a subtotal cholecystectomy for acute gangrenous cholecystitis with gallbladder hydrops on 5/18.  A drain was left in place postop.  Patient was discharged on 5/19.  He was seen in the office on 5/26 for follow-up where the drain output is changed from bilious to purulent in the preceding 24-48 hours.  He was noted to have leukocytosis and hyperbilirubinemia for which she was readmitted on 5/28.  He underwent a CT scan that showed a 8.6 cm complex fluid collection in the gallbladder fossa.  The surgical drain course through the gallbladder fossa.  He underwent a HIDA that was negative for bile leak and showed a patent common bile duct.  Patient was discharged home on 5/29.  Patient was doing well after hospitalization follow-up in the clinic where drain was pulled.  He notes over the weekend he began developing fatigue as well as backache.  He notes he was not able to go through his normal activities of daily living without becoming extremely fatigued.  At work his colleagues took his blood pressure noted to be low so they called EMS. Patient denies any associated fever (tmax 99.1), chills, shortness of breath, abdominal pain, n/v/d. He reports decreased appetitie. He did have some chest pain earlier in the day. EDP has ruled out ACS.  In the ED he has been afebrile without tachycardia, tachypnea, hypoxia or hypotension.  WBC 25.8.  Lipase 55.  AST wnl at 20, ALT wnl at 27, alk phos wnl at 119.   T bili elevated at 1.3.  Glucose 237.  No prior history of diabetes per chart. General surgery was asked to see.   ROS: Review of Systems  Constitutional: Positive for chills. Negative for fever.  Respiratory: Negative for cough.   Cardiovascular: Positive for chest pain.  Gastrointestinal: Negative for abdominal pain, nausea and vomiting.  Genitourinary: Negative for dysuria.  Musculoskeletal: Positive for back pain.  Psychiatric/Behavioral: Negative for substance abuse.  All other systems reviewed and are negative.   Family History  Problem Relation Age of Onset  . Colon cancer Neg Hx   . Stomach cancer Neg Hx     Past Medical History:  Diagnosis Date  . Hypertension   . Insomnia   . Kidney stones     Past Surgical History:  Procedure Laterality Date  . ARTHROSCOPIC REPAIR ACL     left, 2012; right 2004  . BILIARY STENT PLACEMENT  01/23/2020   Procedure: BILIARY STENT PLACEMENT;  Surgeon: Irving Copas., MD;  Location: [place];  Service: Gastroenterology;;  . CHOLECYSTECTOMY N/A 01/24/2020   Procedure: LAPAROSCOPIC CHOLECYSTECTOMY;  Surgeon: Jesusita Oka, MD;  Location: [place];  Service: General;  Laterality: N/A;  . ERCP N/A 01/21/2020   Procedure: ENDOSCOPIC RETROGRADE CHOLANGIOPANCREATOGRAPHY (ERCP);  Surgeon: Ladene Artist, MD;  Location: [facility] END[facility]COPY;  Service: Endoscopy;  Laterality: N/A;  . ERCP N/A 01/23/2020   Procedure: ENDOSCOPIC RETROGRADE CHOLANGIOPANCREATOGRAPHY (ERCP);  Surgeon: Rush Landmark Telford Nab., MD;  Location: [place];  Service: Gastroenterology;  Laterality: N/A;  . PANCREATIC STENT PLACEMENT  01/21/2020   Procedure: PANCREATIC STENT PLACEMENT;  Surgeon: Ladene Artist, MD;  Location: [place];  Service: Endoscopy;;  . PANCREATIC STENT PLACEMENT  01/23/2020   Procedure: PANCREATIC STENT PLACEMENT;  Surgeon: Irving Copas., MD;  Location: [place];  Service: Gastroenterology;;  . [name]  01/23/2020    Procedure: [name];  Surgeon: Mansouraty, Telford Nab., MD;  Location: [place];  Service: Gastroenterology;;    Social History:  reports that he has never smoked. He has never used smokeless tobacco. He reports current alcohol use of about 2.0 standard drinks of alcohol per week. He reports that he does not use drugs.  Allergies: No Known Allergies  (Not in a hospital admission)   Physical Exam: Blood pressure 121/74, pulse 73, temperature 98.8 F (37.1 C), temperature source Oral, resp. rate 19, height _0  (1.753 m), weight 78 kg, SpO2 99 %. General: pleasant, WD/WN white male who is laying in bed in NAD HEENT: head is normocephalic, atraumatic.  Sclera are noninjected.  PERRL.  Ears and nose without any masses or lesions.  Mouth is pink and moist. Dentition fair Heart: regular, rate, and rhythm.  Normal s1,s2. No obvious murmurs, gallops, or rubs noted.  Palpable pedal pulses bilaterally  Lungs: CTAB, no wheezes, rhonchi, or rales noted.  Respiratory effort nonlabored Abd: Soft, ND, there is mild tenderness of the right upper quadrant and the lateral most laparoscopic incision site. No peritoneal signs. There is a small amount of firmness surrounding the right lateral most laparoscopic incision without erythema, heat, fluctuance or drainage. +BS. No masses, hernias, or organomegaly.  MS: no BUE/BLE edema, calves soft and nontender Skin: warm and dry with no masses, lesions, or rashes Psych: A&Ox4 with an appropriate affect Neuro: cranial nerves grossly intact, equal strength in BUE/BLE bilaterally, normal speech, though process intact  Results for orders placed or performed during the hospital encounter of 02/13/20 (from the past 48 hour(s))  Comprehensive metabolic panel     Status: Abnormal   Collection Time: 02/13/20  9:45 AM  Result Value Ref Range   Sodium 131 (L) 135 - 145 mmol/L   Potassium 4.0 3.5 - 5.1 mmol/L   Chloride 100 98 - 111 mmol/L   CO2 21 (L) 22 - 32  mmol/L   Glucose, Bld 237 (H) 70 - 99 mg/dL    Comment: Glucose reference range applies only to samples taken after fasting for at least 8 hours.   BUN 22 8 - 23 mg/dL   Creatinine, Ser 1.00 0.61 - 1.24 mg/dL   Calcium 9.1 8.9 - 10.3 mg/dL   Total Protein 6.8 6.5 - 8.1 g/dL   Albumin 2.7 (L) 3.5 - 5.0 g/dL   AST 20 15 - 41 U/L   ALT 27 0 - 44 U/L   Alkaline Phosphatase 119 38 - 126 U/L   Total Bilirubin 1.3 (H) 0.3 - 1.2 mg/dL   GFR calc non Af Amer >60 >60 mL/min   GFR calc Af Amer >60 >60 mL/min   Anion gap 10 5 - 15    Comment: Performed at [facility], [place] [address], [place], [place] [address]  Lipase, blood     Status: Abnormal   Collection Time: 02/13/20  9:45 AM  Result Value Ref Range   Lipase 55 (H) 11 - 51 U/L    Comment: Performed at [facility], [place] [address] [place], [place] [address]  Troponin  I (High Sensitivity)     Status: None   Collection Time: 02/13/20  9:45 AM  Result Value Ref Range   Troponin I (High Sensitivity) 7 <18 ng/L    Comment: (NOTE) Elevated high sensitivity troponin I (hsTnI) values and significant  changes across serial measurements may suggest ACS but many other  chronic and acute conditions are known to elevate hsTnI results.  Refer to the "Links" section for chest pain algorithms and additional  guidance. Performed at [facility], [place] [address], [place], [place] [address]   CBC with Differential     Status: Abnormal   Collection Time: 02/13/20  9:45 AM  Result Value Ref Range   WBC 25.8 (H) 4.0 - 10.5 K/uL   RBC 4.79 4.22 - 5.81 MIL/uL   Hemoglobin 13.8 13.0 - 17.0 g/dL   HCT 42.4 39.0 - 52.0 %   MCV 88.5 80.0 - 100.0 fL   MCH 28.8 26.0 - 34.0 pg   MCHC 32.5 30.0 - 36.0 g/dL   RDW 12.7 11.5 - 15.5 %   Platelets 294 150 - 400 K/uL   nRBC 0.0 0.0 - 0.2 %   Neutrophils Relative % 87 %    Comment: WBC CONFIRMED BY SMEAR   Neutro Abs 22.6 (H) 1.7 - 7.7 K/uL   Lymphocytes Relative 4 %   Lymphs Abs 1.1 0.7 -  4.0 K/uL   Monocytes Relative 6 %   Monocytes Absolute 1.4 (H) 0.1 - 1.0 K/uL   Eosinophils Relative 1 %   Eosinophils Absolute 0.2 0.0 - 0.5 K/uL   Basophils Relative 0 %   Basophils Absolute 0.1 0.0 - 0.1 K/uL   Immature Granulocytes 2 %   Abs Immature Granulocytes 0.51 (H) 0.00 - 0.07 K/uL    Comment: Performed at [facility] [address] [place], [place] [address]   CT ABDOMEN PELVIS W CONTRAST  Result Date: 02/13/2020 CLINICAL DATA:  Hypotension and elevated white blood cell count. Cholecystectomy 1 month ago. EXAM: CT ABDOMEN AND PELVIS WITH CONTRAST TECHNIQUE: Multidetector CT imaging of the abdomen and pelvis was performed using the standard protocol following bolus administration of intravenous contrast. CONTRAST:  131m OMNIPAQUE IOHEXOL 300 MG/ML  SOLN COMPARISON:  02/02/2020 FINDINGS: Lower chest: There is a opacity at the base of the right lower lobe, with a smaller area of opacity at the base of the right middle lobe and mild linear opacity at the base of the left lower lobe. These opacities have improved compared to the prior CT, consistent with residual atelectasis. Heart normal in size. Hepatobiliary: Complex fluid collection containing bubbles of air lies in the gallbladder fossa, measuring 7.4 x 4.5 x 4.4 cm. Previously noted percutaneous drains have been removed. There is a sliver of fluid collecting adjacent anterolateral margin of the right liver lobe extending just below the liver, and there are hazy inflammatory changes in the right upper quadrant, centered on the gallbladder fossa. Liver is normal in size and attenuation. No masses or abscess. No intrahepatic bile duct dilation. Common bile duct is normal in caliber. Pancreas: Stable pancreatic duct stent. No pancreatic mass or inflammation. Spleen: Normal in size without focal abnormality. Adrenals/Urinary Tract: No adrenal masses. Kidneys normal in size, orientation and position with symmetric enhancement and  excretion. No renal masses. Bilateral small nonobstructing intrarenal stones. No hydronephrosis. Normal ureters. Normal bladder. Stomach/Bowel: Normal stomach. Normal small bowel. Colon is normal in caliber. There left colon diverticula. No diverticulitis, colonic wall thickening or other  inflammatory process. Normal appendix visualized. Vascular/Lymphatic: Minimal aortic atherosclerosis. No aneurysm. No other vascular abnormality. Prominent and mildly enlarged gastrohepatic ligament lymph nodes, largest 1.5 cm in short axis. No other adenopathy. Reproductive: Unremarkable. Other: No abdominal wall hernia or abnormality. No abdominopelvic ascites. Musculoskeletal: No acute or significant osseous findings. IMPRESSION: 1. Complex collection in the gallbladder fossa including bubbles of air, without change in size compared to the previous CT. Collection is consistent with an abscess, currently measuring 7.4 x 4.5 x 4.4 cm. Smaller collection lies along the anterolateral aspect of the right liver lobe extending just below the liver. There are associated inflammatory changes in the right upper quadrant centered on the gallbladder fossa. Percutaneous drainage tubes noted on the prior CT have been removed. 2. Mildly enlarged gastrohepatic of ligament lymph nodes that are presumed reactive, and which are also stable from the prior CT. 3. No other acute abnormality within the abdomen or pelvis. 4. Lung base opacities most evident in the right lower lobe, improved from the prior CT, consistent with atelectasis. 5. Minimal aortic atherosclerosis. Scattered left colon diverticula. Electronically Signed   By: Lajean Manes M.D.   On: 02/13/2020 13:46   DG Chest Port 1 View  Result Date: 02/13/2020 CLINICAL DATA:  Sudden onset midsternal chest pain. EXAM: PORTABLE CHEST 1 VIEW COMPARISON:  01/20/2020. FINDINGS: Trachea is midline. Heart size stable. Lungs are somewhat low in volume with bibasilar subsegmental atelectasis. No  airspace consolidation or pleural fluid. No pneumothorax. IMPRESSION: Mild bibasilar atelectasis.  No acute findings. Electronically Signed   By: Lorin Picket M.D.   On: 02/13/2020 10:28   Anti-infectives (From admission, onward)   Start     Dose/Rate Route Frequency Ordered Stop   02/13/20 1400  piperacillin-tazobactam (ZOSYN) IVPB 3.375 g     3.375 g 12.5 mL/hr over 240 Minutes Intravenous  Once 02/13/20 1351        Assessment/Plan Hyperglycemia-SSI, send A1c.  No history of diabetes Hyponatremia- IV fluid  Hx subtotal cholecystectomy - Dr. Bobbye Morton - 01/24/2020  Gallbladder fossa fluid collection  - HIDA 5/28 negative for bile leak.  - CT with 7.4 x 4.5 x 4.4cm fluid collection concerning for abscess - IR consultation for drain placement  - IV abx - NPO - Pulm toilet   FEN - NPO, IVF VTE - SCDs, Lovenox ID - Zosyn  Foley - None Follow-up - Dr. Rush Barer, [facility] 02/13/2020, 2:28 PM Please see Amion for pager number during day hours 7:00am-4:30pm

## 2020-02-13 NOTE — ED Notes (Signed)
Pt restful without return of acute sx.   Wife at bedside with no needs voiced.

## 2020-02-13 NOTE — Progress Notes (Signed)
Patient arrived to 6N25, AOx4, ambulatory, denies pain, VSS, oriented room, use of call bell and bed controls. Informed about visitation policy. Will endorsed accordingly.

## 2020-02-13 NOTE — ED Provider Notes (Signed)
[facility] EMERGENCY DEPARTMENT Provider Note   CSN: [id number] Arrival date & time: 02/13/20  9326     History No chief complaint on file.   Tecumseh Yeagley is a 64 y.o. male.  Patient is a 64 year old male with past medical history of hypertension and recent cholecystectomy after being diagnosed with gallstone pancreatitis and necrotic gallbladder.  This was performed 1 month ago and the patient has been healing well since.  Patient tells me he has had weakness throughout the weekend, then this morning felt extremely fatigued and weak while he was attempting to get ready to go to work.  Shortly after arriving at work he developed an episode of sharp pain in the center of his chest.  The pain was severe and lasted approximately 20 minutes, then resolved.  He denies any shortness of breath, nausea, or diaphoresis.  He denies any fevers or chills.  He denies any bowel complaints.  The history is provided by the patient.       Past Medical History:  Diagnosis Date  . Hypertension   . Insomnia   . Kidney stones     Patient Active Problem List   Diagnosis Date Noted  . Hyperbilirubinemia 02/02/2020  . Pancreatitis 01/21/2020  . Bacteremia   . Elevated LFTs   . Jaundice   . Special screening for malignant neoplasms, colon 07/09/2011  . Diverticulosis of colon (without mention of hemorrhage) 07/09/2011  . 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 ARTHROSCOPIC REPAIR ACL     left, 2012; right 2004  . BILIARY STENT PLACEMENT  01/23/2020   Procedure: BILIARY STENT PLACEMENT;  Surgeon: Irving Copas., MD;  Location: [place];  Service: Gastroenterology;;  . CHOLECYSTECTOMY N/A 01/24/2020   Procedure: LAPAROSCOPIC CHOLECYSTECTOMY;  Surgeon: Jesusita Oka, MD;  Location: [place];  Service: General;  Laterality: N/A;  . ERCP N/A 01/21/2020   Procedure: ENDOSCOPIC RETROGRADE CHOLANGIOPANCREATOGRAPHY (ERCP);   Surgeon: Ladene Artist, MD;  Location: [facility] ENDOSCOPY;  Service: Endoscopy;  Laterality: N/A;  . ERCP N/A 01/23/2020   Procedure: ENDOSCOPIC RETROGRADE CHOLANGIOPANCREATOGRAPHY (ERCP);  Surgeon: Irving Copas., MD;  Location: [place];  Service: Gastroenterology;  Laterality: N/A;  . PANCREATIC STENT PLACEMENT  01/21/2020   Procedure: PANCREATIC STENT PLACEMENT;  Surgeon: Ladene Artist, MD;  Location: [place];  Service: Endoscopy;;  . PANCREATIC STENT PLACEMENT  01/23/2020   Procedure: PANCREATIC STENT PLACEMENT;  Surgeon: Irving Copas., MD;  Location: [place];  Service: Gastroenterology;;  . [name]  01/23/2020   Procedure: [name];  Surgeon: Irving Copas., MD;  Location: [facility] ENDOSCOPY;  Service: Gastroenterology;;       Family History  Problem Relation Age of Onset  . Colon cancer Neg Hx   . Stomach cancer Neg Hx     Social History   Tobacco Use  . Smoking status: Never Smoker  . Smokeless tobacco: Never Used  Substance Use Topics  . Alcohol use: Yes    Alcohol/week: 2.0 standard drinks    Types: 2 Glasses of wine per week    Comment: occasional  . Drug use: No    Home Medications Prior to Admission medications   Medication Sig Start Date End Date Taking? Authorizing Provider  aspirin EC 81 MG tablet Take 81 mg by mouth 2 (two) times daily.    [provider]  Calcium Carb-Cholecalciferol (CALCIUM 1000 + D PO) Take 1 tablet by mouth daily.  [provider]  Coenzyme Q10 (COQ10) 400 MG CAPS Take 800 mg by mouth daily.    [provider]  Cyanocobalamin (VITAMIN B-12) 5000 MCG TBDP Take 5,000 mcg by mouth daily.    [provider]  dicyclomine (BENTYL) 10 MG capsule Take 10 mg by mouth every 6 (six) hours as needed for spasms.  01/16/20   [provider]  GLUCOSAMINE-CHONDROITIN PO Take 1 tablet by mouth 2 (two) times daily.    [provider]  Javier Docker Oil 1000 MG CAPS  Take 1,000-2,000 mg by mouth See admin instructions. Take one capsule (1000 mg) by mouth every morning and two capsules (2000 mg) at bedtime    [provider]  Moringa Oleifera (MORINGA PO) Take 1,000 mg by mouth 2 (two) times daily.    [provider]  Multiple Vitamin (MULTIVITAMIN WITH MINERALS) TABS tablet Take 1 tablet by mouth daily.    [provider]  oxyCODONE (OXY IR/ROXICODONE) 5 MG immediate release tablet Take 1 tablet (5 mg total) by mouth every 6 (six) hours as needed for moderate pain or severe pain (pain not releived by tylenol or ibuprofen). 01/25/20   Jill Alexanders, PA-C  Red Yeast Rice 600 MG CAPS Take 1,800 mg by mouth at bedtime.    [provider]  sertraline (ZOLOFT) 100 MG tablet Take 100 mg by mouth daily. 11/08/19   [provider]  sertraline (ZOLOFT) 50 MG tablet Take 50 mg by mouth daily.      [provider]  TURMERIC PO Take 1,500 mg by mouth 2 (two) times daily.    [provider]  zolpidem (AMBIEN) 5 MG tablet Take 5 mg by mouth at bedtime. 12/29/19   [provider]    Allergies    Patient has no known allergies.  Review of Systems   Review of Systems  All other systems reviewed and are negative.   Physical Exam Updated Vital Signs BP 114/76 (BP Location: Right Arm)   Pulse 77   Temp 98.8 F (37.1 C) (Oral)   Resp 16   Ht 5\' 9"  (1.753 m)   Wt 78 kg   SpO2 95%   BMI 25.40 kg/m   Physical Exam Vitals and nursing note reviewed.  Constitutional:      General: He is not in acute distress.    Appearance: He is well-developed. He is not diaphoretic.  HENT:     Head: Normocephalic and atraumatic.  Cardiovascular:     Rate and Rhythm: Normal rate and regular rhythm.     Heart sounds: No murmur. No friction rub.  Pulmonary:     Effort: Pulmonary effort is normal. No respiratory distress.     Breath sounds: Normal breath sounds. No wheezing or rales.  Abdominal:      General: Bowel sounds are normal. There is no distension.     Palpations: Abdomen is soft.     Tenderness: There is no abdominal tenderness.  Musculoskeletal:        General: Normal range of motion.     Cervical back: Normal range of motion and neck supple.  Skin:    General: Skin is warm and dry.  Neurological:     Mental Status: He is alert and oriented to person, place, and time.     Coordination: Coordination normal.     ED Results / Procedures / Treatments   Labs (all labs ordered are listed, but only abnormal results are displayed) Labs Reviewed  COMPREHENSIVE  METABOLIC PANEL  LIPASE, BLOOD  CBC WITH DIFFERENTIAL/PLATELET  TROPONIN I (HIGH SENSITIVITY)    EKG EKG Interpretation  Date/Time:  Monday February 13 2020 09:27:34 EDT Ventricular Rate:  77 PR Interval:    QRS Duration: 83 QT Interval:  380 QTC Calculation: 430 R Axis:   50 Text Interpretation: Sinus rhythm Baseline wander in lead(s) V2 Confirmed by Veryl Speak [phone]) on 02/13/2020 9:42:52 AM   Radiology No results found.  Procedures Procedures (including critical care time)  Medications Ordered in ED Medications  sodium chloride 0.9 % bolus 1,000 mL (has no administration in time range)    ED Course  I have reviewed the triage vital signs and the nursing notes.  Pertinent labs & imaging results that were available during my care of the patient were reviewed by me and considered in my medical decision making (see chart for details).    MDM Rules/Calculators/A&P  Patient is a 64 year old male presenting with complaints of weakness for the past 3 days and an episode of epigastric/chest pain that began today at work.  Patient's work-up reveals a white count of 25,000. He recently underwent gallbladder surgery, so patient sent for CT to rule out surgical complication. This can does show what appears to be an abscess in the gallbladder fossa. This finding discussed with Dr. Bobbye Morton who is the patient's  surgeon. Patient will go to IR for drain placement. Patient to be admitted by the general surgery service. He was given IV Zosyn in the ED.  Also considered is the possibility of cardiac etiology. Patient's troponin is negative and EKG is normal. He has no history of cardiac disease and I highly doubt a cardiac etiology to this presentation.  CRITICAL CARE Performed by: Veryl Speak Total critical care time: 35 minutes Critical care time was exclusive of separately billable procedures and treating other patients. Critical care was necessary to treat or prevent imminent or life-threatening deterioration. Critical care was time spent personally by me on the following activities: development of treatment plan with patient and/or surrogate as well as nursing, discussions with consultants, evaluation of patient's response to treatment, examination of patient, obtaining history from patient or surrogate, ordering and performing treatments and interventions, ordering and review of laboratory studies, ordering and review of radiographic studies, pulse oximetry and re-evaluation of patient's condition.   Final Clinical Impression(s) / ED Diagnoses Final diagnoses:  None    Rx / DC Orders ED Discharge Orders    None       Veryl Speak, MD 02/13/20 1549

## 2020-02-13 NOTE — ED Notes (Signed)
Attempt to call report x2  

## 2020-02-13 NOTE — Consult Note (Signed)
Chief Complaint: Patient was seen in consultation today for abdominal abscess drain placement at the request of Dr Doylene Canning   Supervising Physician: Daryll Brod  Patient Status: [facility] - ED  History of Present Illness: Adam Fowler is a 64 y.o. male   Subtotal cholecystectomy for gangrenous cholecystitis 01/24/20-- Dr Bobbye Morton DC 5/19  5/26: clinic follow up; OP from drain noted to be purulent New leukocytosis  Admitted for evaluation and imaging 5/28 Hida: no leak Dc'd home again 5/28  In ED today with abd pain and  Nausea New imaging today: CT: IMPRESSION: 1. Complex collection in the gallbladder fossa including bubbles of air, without change in size compared to the previous CT. Collection is consistent with an abscess, currently measuring 7.4 x 4.5 x 4.4 cm. Smaller collection lies along the anterolateral aspect of the right liver lobe extending just below the liver. There are associated inflammatory changes in the right upper quadrant centered on the gallbladder fossa. Percutaneous drainage tubes noted on the prior CT have been removed. 2. Mildly enlarged gastrohepatic of ligament lymph nodes that are presumed reactive, and which are also stable from the prior CT. 3. No other acute abnormality within the abdomen or pelvis. 4. Lung base opacities most evident in the right lower lobe, improved from the prior CT, consistent with atelectasis. 5. Minimal aortic atherosclerosis. Scattered left colon diverticula.  Wbc 25.8  Dr Bobbye Morton requesting abscess drain placement Pt has been npo since 56 am  Dr Annamaria Boots has reviewed imaging and approves procedure   Past Medical History:  Diagnosis Date  . Hypertension   . Insomnia   . Kidney stones     Past Surgical History:  Procedure Laterality Date  . ARTHROSCOPIC REPAIR ACL     left, 2012; right 2004  . BILIARY STENT PLACEMENT  01/23/2020   Procedure: BILIARY STENT PLACEMENT;  Surgeon: Irving Copas., MD;   Location: [place];  Service: Gastroenterology;;  . CHOLECYSTECTOMY N/A 01/24/2020   Procedure: LAPAROSCOPIC CHOLECYSTECTOMY;  Surgeon: Jesusita Oka, MD;  Location: [place];  Service: General;  Laterality: N/A;  . ERCP N/A 01/21/2020   Procedure: ENDOSCOPIC RETROGRADE CHOLANGIOPANCREATOGRAPHY (ERCP);  Surgeon: Ladene Artist, MD;  Location: [facility] ENDOSCOPY;  Service: Endoscopy;  Laterality: N/A;  . ERCP N/A 01/23/2020   Procedure: ENDOSCOPIC RETROGRADE CHOLANGIOPANCREATOGRAPHY (ERCP);  Surgeon: Irving Copas., MD;  Location: [place];  Service: Gastroenterology;  Laterality: N/A;  . PANCREATIC STENT PLACEMENT  01/21/2020   Procedure: PANCREATIC STENT PLACEMENT;  Surgeon: Ladene Artist, MD;  Location: [place];  Service: Endoscopy;;  . PANCREATIC STENT PLACEMENT  01/23/2020   Procedure: PANCREATIC STENT PLACEMENT;  Surgeon: Irving Copas., MD;  Location: [place];  Service: Gastroenterology;;  . [name]  01/23/2020   Procedure: [name];  Surgeon: Mansouraty, Telford Nab., MD;  Location: [place];  Service: Gastroenterology;;    Allergies: Patient has no known allergies.  Medications: Prior to Admission medications   Medication Sig Start Date End Date Taking? Authorizing Provider  aspirin EC 81 MG tablet Take 81 mg by mouth 2 (two) times daily.   Yes [provider]  Calcium Carb-Cholecalciferol (CALCIUM 1000 + D PO) Take 1 tablet by mouth daily.   Yes [provider]  cetirizine (ZYRTEC) 10 MG tablet Take 10 mg by mouth daily. 02/08/20  Yes [provider]  Coenzyme Q10 (COQ10) 400 MG CAPS Take 800 mg by mouth daily.   Yes [provider]  Cyanocobalamin (VITAMIN B-12) 5000 MCG TBDP Take  5,000 mcg by mouth daily.   Yes [provider]  dicyclomine (BENTYL) 10 MG capsule Take 10 mg by mouth every 6 (six) hours as needed for spasms.  01/16/20  Yes [provider]  GLUCOSAMINE-CHONDROITIN PO  Take 1 tablet by mouth 2 (two) times daily.   Yes [provider]  Javier Docker Oil 1000 MG CAPS Take 2,000 mg by mouth every evening.    Yes [provider]  Moringa Oleifera (MORINGA PO) Take 1,000 mg by mouth 2 (two) times daily.   Yes [provider]  Multiple Vitamin (MULTIVITAMIN WITH MINERALS) TABS tablet Take 1 tablet by mouth daily.   Yes [provider]  pravastatin (PRAVACHOL) 80 MG tablet Take 40 mg by mouth in the morning and at bedtime.   Yes [provider]  Red Yeast Rice 600 MG CAPS Take 1,200 mg by mouth at bedtime.    Yes [provider]  sertraline (ZOLOFT) 100 MG tablet Take 100 mg by mouth daily. 11/08/19  Yes [provider]  TURMERIC PO Take 1,500 mg by mouth 2 (two) times daily.   Yes [provider]  zolpidem (AMBIEN) 5 MG tablet Take 5 mg by mouth at bedtime. 12/29/19  Yes [provider]  oxyCODONE (OXY IR/ROXICODONE) 5 MG immediate release tablet Take 1 tablet (5 mg total) by mouth every 6 (six) hours as needed for moderate pain or severe pain (pain not releived by tylenol or ibuprofen). Patient not taking: Reported on 02/13/2020 01/25/20   Jill Alexanders, PA-C     Family History  Problem Relation Age of Onset  . Colon cancer Neg Hx   . Stomach cancer Neg Hx     Social History   Socioeconomic History  . Marital status: Married    Spouse name: Not on file  . Number of children: Not on file  . Years of education: Not on file  . Highest education level: Not on file  Occupational History  . Not on file  Tobacco Use  . Smoking status: Never Smoker  . Smokeless tobacco: Never Used  Substance and Sexual Activity  . Alcohol use: Yes    Alcohol/week: 2.0 standard drinks    Types: 2 Glasses of wine per week    Comment: occasional  . Drug use: No  . Sexual activity: Not Currently    Partners: Female  Other Topics Concern  . Not on file  Social History Narrative  . Not on file    Social Determinants of Health   Financial Resource Strain:   . Difficulty of Paying Living Expenses:   Food Insecurity:   . Worried About Charity fundraiser in the Last Year:   . Arboriculturist in the Last Year:   Transportation Needs:   . Film/video editor (Medical):   Marland Kitchen Lack of Transportation (Non-Medical):   Physical Activity:   . Days of Exercise per Week:   . Minutes of Exercise per Session:   Stress:   . Feeling of Stress :   Social Connections:   . Frequency of Communication with Friends and Family:   . Frequency of Social Gatherings with Friends and Family:   . Attends Religious Services:   . Active Member of Clubs or Organizations:   . Attends Archivist Meetings:   Marland Kitchen Marital Status:      Review of Systems: A 12 point ROS discussed and pertinent positives are indicated in the HPI above.  All other systems  are negative.  Review of Systems  Constitutional: Positive for activity change.  Cardiovascular: Negative for chest pain.  Gastrointestinal: Positive for abdominal pain and nausea.  Musculoskeletal: Positive for back pain.  Psychiatric/Behavioral: Negative for behavioral problems and confusion.    Vital Signs: BP 121/74   Pulse 73   Temp 98.8 F (37.1 C) (Oral)   Resp 19   Ht 5\' 9"  (1.753 m)   Wt 172 lb (78 kg)   SpO2 99%   BMI 25.40 kg/m   Physical Exam Cardiovascular:     Rate and Rhythm: Normal rate and regular rhythm.  Pulmonary:     Breath sounds: Normal breath sounds.  Abdominal:     Palpations: Abdomen is soft.     Tenderness: There is abdominal tenderness.  Musculoskeletal:        General: Normal range of motion.  Skin:    General: Skin is warm and dry.  Neurological:     Mental Status: He is alert and oriented to person, place, and time.  Psychiatric:        Behavior: Behavior normal.     Imaging: CT ABDOMEN PELVIS W CONTRAST  Result Date: 02/13/2020 CLINICAL DATA:  Hypotension and elevated white blood cell  count. Cholecystectomy 1 month ago. EXAM: CT ABDOMEN AND PELVIS WITH CONTRAST TECHNIQUE: Multidetector CT imaging of the abdomen and pelvis was performed using the standard protocol following bolus administration of intravenous contrast. CONTRAST:  149mL OMNIPAQUE IOHEXOL 300 MG/ML  SOLN COMPARISON:  02/02/2020 FINDINGS: Lower chest: There is a opacity at the base of the right lower lobe, with a smaller area of opacity at the base of the right middle lobe and mild linear opacity at the base of the left lower lobe. These opacities have improved compared to the prior CT, consistent with residual atelectasis. Heart normal in size. Hepatobiliary: Complex fluid collection containing bubbles of air lies in the gallbladder fossa, measuring 7.4 x 4.5 x 4.4 cm. Previously noted percutaneous drains have been removed. There is a sliver of fluid collecting adjacent anterolateral margin of the right liver lobe extending just below the liver, and there are hazy inflammatory changes in the right upper quadrant, centered on the gallbladder fossa. Liver is normal in size and attenuation. No masses or abscess. No intrahepatic bile duct dilation. Common bile duct is normal in caliber. Pancreas: Stable pancreatic duct stent. No pancreatic mass or inflammation. Spleen: Normal in size without focal abnormality. Adrenals/Urinary Tract: No adrenal masses. Kidneys normal in size, orientation and position with symmetric enhancement and excretion. No renal masses. Bilateral small nonobstructing intrarenal stones. No hydronephrosis. Normal ureters. Normal bladder. Stomach/Bowel: Normal stomach. Normal small bowel. Colon is normal in caliber. There left colon diverticula. No diverticulitis, colonic wall thickening or other inflammatory process. Normal appendix visualized. Vascular/Lymphatic: Minimal aortic atherosclerosis. No aneurysm. No other vascular abnormality. Prominent and mildly enlarged gastrohepatic ligament lymph nodes, largest 1.5  cm in short axis. No other adenopathy. Reproductive: Unremarkable. Other: No abdominal wall hernia or abnormality. No abdominopelvic ascites. Musculoskeletal: No acute or significant osseous findings. IMPRESSION: 1. Complex collection in the gallbladder fossa including bubbles of air, without change in size compared to the previous CT. Collection is consistent with an abscess, currently measuring 7.4 x 4.5 x 4.4 cm. Smaller collection lies along the anterolateral aspect of the right liver lobe extending just below the liver. There are associated inflammatory changes in the right upper quadrant centered on the gallbladder fossa. Percutaneous drainage tubes noted on the prior CT have been  removed. 2. Mildly enlarged gastrohepatic of ligament lymph nodes that are presumed reactive, and which are also stable from the prior CT. 3. No other acute abnormality within the abdomen or pelvis. 4. Lung base opacities most evident in the right lower lobe, improved from the prior CT, consistent with atelectasis. 5. Minimal aortic atherosclerosis. Scattered left colon diverticula. Electronically Signed   By: Lajean Manes M.D.   On: 02/13/2020 13:46   CT ABDOMEN PELVIS W CONTRAST  Result Date: 02/02/2020 CLINICAL DATA:  Intra-abdominal abscess. EXAM: CT ABDOMEN AND PELVIS WITH CONTRAST TECHNIQUE: Multidetector CT imaging of the abdomen and pelvis was performed using the standard protocol following bolus administration of intravenous contrast. CONTRAST:  143mL OMNIPAQUE IOHEXOL 300 MG/ML  SOLN COMPARISON:  01/20/2020 FINDINGS: Lower chest: There are small bilateral pleural effusions, right greater than left. There is bibasilar atelectasis, right worse than left.The heart size is normal. Hepatobiliary: The liver is normal. There is a complex collection in the gallbladder fossa measuring 8.6 x 5.1 cm. A surgical drain courses through this collection.There are pockets of gas within this collection. There is low attenuation material  along the course of the surgical drain. This may represent surgical packing material. Pancreas: A plastic pancreatic duct stent is noted. Spleen: The spleen is enlarged measuring 13 cm craniocaudad. Adrenals/Urinary Tract: --Adrenal glands: Unremarkable. --Right kidney/ureter: Small nonobstructing stones are noted in the right kidney. --Left kidney/ureter: Small nonobstructing stones are noted in the upper pole the left kidney. --Urinary bladder: Unremarkable. Stomach/Bowel: --Stomach/Duodenum: No hiatal hernia or other gastric abnormality. Normal duodenal course and caliber. --Small bowel: Unremarkable. --Colon: There is scattered colonic diverticula without CT evidence for diverticulitis. There is some mild wall thickening of the hepatic flexure of the colon, favored to be reactive. --Appendix: Normal. Vascular/Lymphatic: Atherosclerotic calcification is present within the non-aneurysmal abdominal aorta, without hemodynamically significant stenosis. --No retroperitoneal lymphadenopathy. --No mesenteric lymphadenopathy. --No pelvic or inguinal lymphadenopathy. Reproductive: Unremarkable Other: No ascites or free air. The abdominal wall is normal. Musculoskeletal. No acute displaced fractures. IMPRESSION: 1. There is a complex 8.6 cm collection in the gallbladder fossa. This is likely related to the patient's reported subtotal cholecystectomy. Is unclear how much of this collection represents abscess or residual gallbladder. A surgical drain courses through the gallbladder fossa in likely through the residual gallbladder itself. There is low attenuation material surrounding the surgical drain within the gallbladder fossa. This is favored to represent surgical packing material and should be correlated with the patient's surgical history. There are persistent inflammatory changes in the right upper quadrant. 2. Small bilateral pleural effusions, right greater than left, with bibasilar atelectasis, right worse than  left. 3. Mild wall thickening of the hepatic flexure of the colon, favored to be reactive. 4. Splenomegaly. 5. Bilateral nonobstructive nephrolithiasis. 6. Pancreatic stent in place. Aortic Atherosclerosis (ICD10-I70.0). Electronically Signed   By: Constance Holster M.D.   On: 02/02/2020 23:51   DG Chest Port 1 View  Result Date: 02/13/2020 CLINICAL DATA:  Sudden onset midsternal chest pain. EXAM: PORTABLE CHEST 1 VIEW COMPARISON:  01/20/2020. FINDINGS: Trachea is midline. Heart size stable. Lungs are somewhat low in volume with bibasilar subsegmental atelectasis. No airspace consolidation or pleural fluid. No pneumothorax. IMPRESSION: Mild bibasilar atelectasis.  No acute findings. Electronically Signed   By: Lorin Picket M.D.   On: 02/13/2020 10:28   DG ERCP BILIARY & PANCREATIC DUCTS  Result Date: 01/23/2020 CLINICAL DATA:  64 year old male with a history of biliary ductal obstruction EXAM: ERCP TECHNIQUE: Multiple spot images  obtained with the fluoroscopic device and submitted for interpretation post-procedure. FLUOROSCOPY TIME:  Fluoroscopy Time:  4 minutes 44 seconds COMPARISON:  Prior ERCP 01/21/2020 FINDINGS: A total of 6 intraoperative spot images and a cine clips are submitted for review. The images demonstrate first cannulation of the main pancreatic duct with placement of a small pancreatic stent. Subsequently, the common bile duct is cannulated. The common bile duct is then balloon swept IMPRESSION: 1. Placement of a plastic pancreatic duct stent. 2. ERCP with balloon sweeping of the common bile duct. These images were submitted for radiologic interpretation only. Please see the procedural report for the amount of contrast and the fluoroscopy time utilized. Electronically Signed   By: Jacqulynn Cadet M.D.   On: 01/23/2020 16:37   DG ERCP BILIARY & PANCREATIC DUCTS  Result Date: 01/21/2020 CLINICAL DATA:  Portable fluoroscopic imaging for your CP. EXAM: ERCP TECHNIQUE: Single spot image  obtained with the fluoroscopic device and submitted for interpretation post-procedure. FLUOROSCOPY TIME:  Fluoroscopy Time:  5 minutes 43 seconds Radiation Exposure Index (if provided by the fluoroscopic device): 96.95 mGy Number of Acquired Spot Images: 1 COMPARISON:  CT, 01/20/2020 FINDINGS: Single image shows the ERCP endoscope, tip projecting in the right mid abdomen. IMPRESSION: Fluoroscopy for ERCP. These images were submitted for radiologic interpretation only. Please see the procedural report for the amount of contrast and the fluoroscopy time utilized. Electronically Signed   By: Lajean Manes M.D.   On: 01/21/2020 15:28   NM HEPATO BILIARY LEAK  Result Date: 02/03/2020 CLINICAL DATA:  Status post cholecystectomy. Evaluate for biliary leak. EXAM: NUCLEAR MEDICINE HEPATOBILIARY IMAGING TECHNIQUE: Sequential images of the abdomen were obtained out to 60 minutes following intravenous administration of radiopharmaceutical. RADIOPHARMACEUTICALS:  5.2 mCi Tc-44m  Choletec IV COMPARISON:  3 CT 02/02/2020, HIDA scan 01/20/2020 FINDINGS: Prompt clearance radiotracer blood pool and homogeneous uptake in liver. Counts are evident the common bile duct and small bowel by 10 minutes. There is no extraluminal collection of radiotracer to suggest bile leak. Bile counts are noted to reflux into the stomach along the LEFT hepatic lobe. IMPRESSION: 1. No evidence of bowel leak. 2. Patent common bile duct Electronically Signed   By: Suzy Bouchard M.D.   On: 02/03/2020 13:18    Labs:  CBC: Recent Labs    02/02/20 2115 02/03/20 0311 02/04/20 0728 02/13/20 0945  WBC 14.8* 14.6* 9.9 25.8*  HGB 12.2* 12.4* 11.9* 13.8  HCT 36.9* 37.1* 35.6* 42.4  PLT 391 408* 376 294    COAGS: Recent Labs    02/02/20 1902 02/02/20 2115  INR 1.2 1.1    BMP: Recent Labs    02/02/20 2115 02/03/20 0311 02/04/20 0728 02/13/20 0945  NA 136 133* 139 131*  K 3.7 3.8 3.6 4.0  CL 102 103 106 100  CO2 24 22 21* 21*    GLUCOSE 115* 131* 130* 237*  BUN 12 10 12 22   CALCIUM 8.2* 8.3* 8.4* 9.1  CREATININE 0.76 0.84 0.79 1.00  GFRNONAA >60 >60 >60 >60  GFRAA >60 >60 >60 >60    LIVER FUNCTION TESTS: Recent Labs    02/02/20 2115 02/03/20 0311 02/04/20 0728 02/13/20 0945  BILITOT 1.3* 1.4* 0.5 1.3*  AST 37 35 28 20  ALT 35 35 33 27  ALKPHOS 123 111 97 119  PROT 6.0* 6.1* 5.8* 6.8  ALBUMIN 2.4* 2.4* 2.2* 2.7*    TUMOR MARKERS: No results for input(s): AFPTM, CEA, CA199, CHROMGRNA in the last 8760 hours.  Assessment  and Plan:  Gall bladder fossa abscess Post 01/24/20 subtotal cholecystectomy Scheduled for abscess drain placement in IR Risks and benefits discussed with the patient including bleeding, infection, damage to adjacent structures, bowel perforation/fistula connection, and sepsis.  All of the patient's questions were answered, patient is agreeable to proceed. Consent signed and in chart.    Thank you for this interesting consult.  I greatly enjoyed meeting Patty Lopezgarcia and look forward to participating in their care.  A copy of this report was sent to the requesting provider on this date.  Electronically Signed: Lavonia Drafts, PA-C 02/13/2020, 2:29 PM   I spent a total of 20 Minutes    in face to face in clinical consultation, greater than 50% of which was counseling/coordinating care for abdominal abscess drain

## 2020-02-13 NOTE — Sedation Documentation (Signed)
Pt arrived to CT room 3 for procedure. Pt is alert and oriented x4, consent signed. NPO since 0800 this morning

## 2020-02-13 NOTE — ED Notes (Signed)
Pt also has generalized red raised rash that has been treated with Kenalog IM and zyrtec 1 week ago, with zyrtec every day.  Continues to have pruritis.

## 2020-02-13 NOTE — ED Notes (Signed)
Attempt to call report x 1  

## 2020-02-13 NOTE — ED Triage Notes (Signed)
Sitting at desk at work with sudden onset midsternal CP which lasted appox 20 min. Took tums with without relief.  States this AM he felt "drained" with no energy and needing to stop and rest during shower and getting ready.  Hx of HTN but recently had chole 1 month ago and has had low BP since.  No CP this AM while getting ready. Does have hx of GERD and takes tums 2-3 x week.  No radiation of pain, no N/V, diaphoresis or dizziness.  Pt alert and oriented with no return of pain since.

## 2020-02-14 LAB — CBC
HCT: 37.8 % — ABNORMAL LOW (ref 39.0–52.0)
Hemoglobin: 12.4 g/dL — ABNORMAL LOW (ref 13.0–17.0)
MCH: 29 pg (ref 26.0–34.0)
MCHC: 32.8 g/dL (ref 30.0–36.0)
MCV: 88.5 fL (ref 80.0–100.0)
Platelets: 263 10*3/uL (ref 150–400)
RBC: 4.27 MIL/uL (ref 4.22–5.81)
RDW: 12.8 % (ref 11.5–15.5)
WBC: 19.8 10*3/uL — ABNORMAL HIGH (ref 4.0–10.5)
nRBC: 0 % (ref 0.0–0.2)

## 2020-02-14 LAB — LIPASE, BLOOD: Lipase: 36 U/L (ref 11–51)

## 2020-02-14 LAB — COMPREHENSIVE METABOLIC PANEL
ALT: 26 U/L (ref 0–44)
AST: 18 U/L (ref 15–41)
Albumin: 2.4 g/dL — ABNORMAL LOW (ref 3.5–5.0)
Alkaline Phosphatase: 89 U/L (ref 38–126)
Anion gap: 9 (ref 5–15)
BUN: 19 mg/dL (ref 8–23)
CO2: 26 mmol/L (ref 22–32)
Calcium: 8.6 mg/dL — ABNORMAL LOW (ref 8.9–10.3)
Chloride: 99 mmol/L (ref 98–111)
Creatinine, Ser: 0.85 mg/dL (ref 0.61–1.24)
GFR calc Af Amer: >60 mL/min (ref >60)
GFR calc non Af Amer: >60 mL/min (ref >60)
Glucose, Bld: 137 mg/dL — ABNORMAL HIGH (ref 70–99)
Potassium: 4.5 mmol/L (ref 3.5–5.1)
Sodium: 134 mmol/L — ABNORMAL LOW (ref 135–145)
Total Bilirubin: 1.1 mg/dL (ref 0.3–1.2)
Total Protein: 5.8 g/dL — ABNORMAL LOW (ref 6.5–8.1)

## 2020-02-14 MED ORDER — SODIUM CHLORIDE 0.9% FLUSH: 5.0000 mL | Freq: Every day | INTRAVENOUS | 1 refills | Status: DC

## 2020-02-14 MED ORDER — AMOXICILLIN-POT CLAVULANATE 875-125 MG PO TABS
1.0000 | ORAL_TABLET | Freq: Two times a day (BID) | ORAL | 0 refills | Status: DC
Start: 2020-02-14 — End: 2020-03-21

## 2020-02-14 NOTE — Progress Notes (Signed)
Discharged patient to home, AVS given and explained with wife at bedside. Discharged instructions understood by patient regarding care of jp drain and follow ups. Belongings returned accordingly.

## 2020-02-14 NOTE — Discharge Summary (Signed)
Discharge Summary  [facility]  Patient ID: Adam Fowler [id number] 12-19-1955 64 y.o.  Admit date: 02/13/2020 Discharge date: 02/14/2020  Admitting Diagnosis: Hx subtotal cholecystectomy - Dr. Bobbye Morton - 01/24/2020  Gallbladder fossa fluid collection   Discharge Diagnosis Patient Active Problem List   Diagnosis Date Noted  . Postprocedural intraabdominal abscess 02/13/2020  . Hyperbilirubinemia 02/02/2020  . Pancreatitis 01/21/2020  . Bacteremia   . Elevated LFTs   . Jaundice   . Special screening for malignant neoplasms, colon 07/09/2011  . Diverticulosis of colon (without mention of hemorrhage) 07/09/2011  . 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 male who presented to [facility] today with back pain, fatigue and low BP.  Patient was originally admitted on 5/15 for gallstone pancreatitis.  Patient underwent ERCP x2 during admission.  There was difficult cannulation during ERCP and ultimately a wire was able to be placed into the CBD to allow cannulation and a biliary sphincterotomy was performed.  Patient underwent a subtotal cholecystectomy for acute gangrenous cholecystitis with gallbladder hydrops on 5/18.  A drain was left in place postop.  Patient was discharged on 5/19.  He was seen in the office on 5/26 for follow-up where the drain output is changed from bilious to purulent in the preceding 24-48 hours.  He was noted to have leukocytosis and hyperbilirubinemia for which she was readmitted on 5/28.  He underwent a CT scan that showed a 8.6 cm complex fluid collection in the gallbladder fossa.  The surgical drain course through the gallbladder fossa.  He underwent a HIDA that was negative for bile leak and showed a patent common bile duct.  Patient was discharged home on 5/29.  Patient was doing well after hospitalization follow-up in the clinic where drain was pulled.  He notes over the weekend he began developing fatigue as  well as backache.  He notes he was not able to go through his normal activities of daily living without becoming extremely fatigued.  At work his colleagues took his blood pressure noted to be low so they called EMS. Patient denies any associated fever (tmax 99.1), chills, shortness of breath, abdominal pain, n/v/d. He reports decreased appetitie. He did have some chest pain earlier in the day. EDP has ruled out ACS.  In the ED he has been afebrile without tachycardia, tachypnea, hypoxia or hypotension.  WBC 25.8.  Lipase 55.  AST wnl at 20, ALT wnl at 27, alk phos wnl at 119.  T bili elevated at 1.3.  Glucose 237.  No prior history of diabetes per chart. General surgery was asked to see.    Procedures Dr. Annamaria Boots - CT guided GB fossa abscess drain placement - 02/13/2020  Hospital Course:  Patient was admitted to the general surgery service with CT scan findings suggestive of gallbladder fossa abscess.  Interventional radiology was consulted and performed a CT-guided gallbladder fossa abscess drain placement.  Patient tolerated this well.  Diet was advanced after the procedure and tolerated.  On HD 1 patient was tolerating a diet, mobilizing, voiding without difficulty, pain controlled on oral medications, WBC improving, VSS and felt to be stable for discharge home.  Patient was discharged home on oral antibiotics as noted below. He was supplied with rx for drain flushes. He is to follow up with Dr. Bobbye Morton in the office.  Return precautions were discussed  Of note, patient was noted to have hyperglycemia on admission.  No history of diabetes.  A1c was checked and noted to be 6.0, in the pre-diabetic range. This was discussed with patient and he is to follow up with his PCP for this.   Physical Exam: Gen:  Alert, NAD, pleasant Card:  RRR Pulm:  CTAB, no W/R/R, effort normal Abd: Soft, NT/ND, +BS, drain in place with SS output. Prior laparoscopic incisions clean and dry.  Ext:  No LE edema  Psych:  A&Ox3  Skin: no rashes noted, warm and dry  Allergies as of 02/14/2020   No Known Allergies     Medication List    TAKE these medications   amoxicillin-clavulanate 875-125 MG tablet Commonly known as: AUGMENTIN Take 1 tablet by mouth every 12 (twelve) hours.   aspirin EC 81 MG tablet Take 81 mg by mouth 2 (two) times daily.   CALCIUM 1000 + D PO Take 1 tablet by mouth daily.   cetirizine 10 MG tablet Commonly known as: ZYRTEC Take 10 mg by mouth daily.   CoQ10 400 MG Caps Take 800 mg by mouth daily.   dicyclomine 10 MG capsule Commonly known as: BENTYL Take 10 mg by mouth every 6 (six) hours as needed for spasms.   GLUCOSAMINE-CHONDROITIN PO Take 1 tablet by mouth 2 (two) times daily.   Krill Oil 1000 MG Caps Take 2,000 mg by mouth every evening.   MORINGA PO Take 1,000 mg by mouth 2 (two) times daily.   multivitamin with minerals Tabs tablet Take 1 tablet by mouth daily.   oxyCODONE 5 MG immediate release tablet Commonly known as: Oxy IR/ROXICODONE Take 1 tablet (5 mg total) by mouth every 6 (six) hours as needed for moderate pain or severe pain (pain not releived by tylenol or ibuprofen).   pravastatin 80 MG tablet Commonly known as: PRAVACHOL Take 40 mg by mouth in the morning and at bedtime.   Red Yeast Rice 600 MG Caps Take 1,200 mg by mouth at bedtime.   sertraline 100 MG tablet Commonly known as: ZOLOFT Take 100 mg by mouth daily.   sodium chloride flush 0.9 % Soln Commonly known as: NS 5 mLs by Intracatheter route daily.   TURMERIC PO Take 1,500 mg by mouth 2 (two) times daily.   Vitamin B-12 5000 MCG Tbdp Take 5,000 mcg by mouth daily.   zolpidem 5 MG tablet Commonly known as: AMBIEN Take 5 mg by mouth at bedtime.        Follow-up Information    Jesusita Oka, MD. Go on 03/01/2020.   Specialty: Surgery Why: 9am. Please arrive 30 minutes prior to your appointment for paperwork. Please bring a copy of your insurance card and  ID Contact information: [place] [address] [phone]        [place]. Call.   Why: To schedule a follow up appointment  Contact information: [place] [address] [phone]        Melony Overly, MD Follow up.   Specialty: Family Medicine Why: Please call to schedule a follow up appointment for your elevated blood sugars.  Contact information: [place] [address] [phone]           Signed: Alferd Apa, [facility] Surgery 02/14/2020, 8:42 AM Please see Amion for pager number during day hours 7:00am-4:30pm

## 2020-02-14 NOTE — Progress Notes (Signed)
Referring Physician(s): Dr Bobbye Morton  Supervising Physician: Aletta Edouard  Patient Status:  [facility] - In-pt  Chief Complaint:  GB fossa abscess drain placed 6/7/in IR  Subjective:  Subtotal cholecystectomy for gangrenous cholecystitis 01/24/20-- Dr Bobbye Morton Post abscess - GB fossa Drain placed in IR 6/7  Feeling relief of pressure Less pain Good spirits   Allergies: Patient has no known allergies.  Medications: Prior to Admission medications   Medication Sig Start Date End Date Taking? Authorizing Provider  aspirin EC 81 MG tablet Take 81 mg by mouth 2 (two) times daily.   Yes [provider]  Calcium Carb-Cholecalciferol (CALCIUM 1000 + D PO) Take 1 tablet by mouth daily.   Yes [provider]  cetirizine (ZYRTEC) 10 MG tablet Take 10 mg by mouth daily. 02/08/20  Yes [provider]  Coenzyme Q10 (COQ10) 400 MG CAPS Take 800 mg by mouth daily.   Yes [provider]  Cyanocobalamin (VITAMIN B-12) 5000 MCG TBDP Take 5,000 mcg by mouth daily.   Yes [provider]  dicyclomine (BENTYL) 10 MG capsule Take 10 mg by mouth every 6 (six) hours as needed for spasms.  01/16/20  Yes [provider]  GLUCOSAMINE-CHONDROITIN PO Take 1 tablet by mouth 2 (two) times daily.   Yes [provider]  Javier Docker Oil 1000 MG CAPS Take 2,000 mg by mouth every evening.    Yes [provider]  Moringa Oleifera (MORINGA PO) Take 1,000 mg by mouth 2 (two) times daily.   Yes [provider]  Multiple Vitamin (MULTIVITAMIN WITH MINERALS) TABS tablet Take 1 tablet by mouth daily.   Yes [provider]  pravastatin (PRAVACHOL) 80 MG tablet Take 40 mg by mouth in the morning and at bedtime.   Yes [provider]  Red Yeast Rice 600 MG CAPS Take 1,200 mg by mouth at bedtime.    Yes [provider]  sertraline (ZOLOFT) 100 MG tablet Take 100 mg by mouth daily. 11/08/19  Yes [provider]  TURMERIC PO  Take 1,500 mg by mouth 2 (two) times daily.   Yes [provider]  zolpidem (AMBIEN) 5 MG tablet Take 5 mg by mouth at bedtime. 12/29/19  Yes [provider]  amoxicillin-clavulanate (AUGMENTIN) 875-125 MG tablet Take 1 tablet by mouth every 12 (twelve) hours. 02/14/20   Maczis, Barth Kirks, PA-C  oxyCODONE (OXY IR/ROXICODONE) 5 MG immediate release tablet Take 1 tablet (5 mg total) by mouth every 6 (six) hours as needed for moderate pain or severe pain (pain not releived by tylenol or ibuprofen). Patient not taking: Reported on 02/13/2020 01/25/20   Jill Alexanders, PA-C  sodium chloride flush (NS) 0.9 % SOLN 5 mLs by Intracatheter route daily. 02/14/20   Maczis, Barth Kirks, PA-C     Vital Signs: BP 120/72 (BP Location: Left Arm)   Pulse 61   Temp 98.1 F (36.7 C) (Oral)   Resp 16   Ht 5\' 9"  (1.753 m)   Wt 172 lb (78 kg)   SpO2 97%   BMI 25.40 kg/m   Physical Exam Vitals reviewed.  Skin:    General: Skin is warm and dry.     Comments: Skin site is clean and dry NT no bleeding OP bloody Flushes easily  50 cc yesterday-- 10 cc in JP   Neurological:     Mental Status: He is alert.     Imaging: CT ABDOMEN PELVIS W CONTRAST  Result Date: 02/13/2020 CLINICAL DATA:  Hypotension and elevated white blood cell count. Cholecystectomy 1 month ago. EXAM: CT ABDOMEN AND PELVIS WITH CONTRAST TECHNIQUE: Multidetector CT imaging of the abdomen and pelvis was performed using the standard protocol following bolus administration of intravenous contrast. CONTRAST:  118mL OMNIPAQUE IOHEXOL 300 MG/ML  SOLN COMPARISON:  02/02/2020 FINDINGS: Lower chest: There is a opacity at the base of the right lower lobe, with a smaller area of opacity at the base of the right middle lobe and mild linear opacity at the base of the left lower lobe. These opacities have improved compared to the prior CT, consistent with residual atelectasis. Heart normal in size. Hepatobiliary: Complex fluid collection  containing bubbles of air lies in the gallbladder fossa, measuring 7.4 x 4.5 x 4.4 cm. Previously noted percutaneous drains have been removed. There is a sliver of fluid collecting adjacent anterolateral margin of the right liver lobe extending just below the liver, and there are hazy inflammatory changes in the right upper quadrant, centered on the gallbladder fossa. Liver is normal in size and attenuation. No masses or abscess. No intrahepatic bile duct dilation. Common bile duct is normal in caliber. Pancreas: Stable pancreatic duct stent. No pancreatic mass or inflammation. Spleen: Normal in size without focal abnormality. Adrenals/Urinary Tract: No adrenal masses. Kidneys normal in size, orientation and position with symmetric enhancement and excretion. No renal masses. Bilateral small nonobstructing intrarenal stones. No hydronephrosis. Normal ureters. Normal bladder. Stomach/Bowel: Normal stomach. Normal small bowel. Colon is normal in caliber. There left colon diverticula. No diverticulitis, colonic wall thickening or other inflammatory process. Normal appendix visualized. Vascular/Lymphatic: Minimal aortic atherosclerosis. No aneurysm. No other vascular abnormality. Prominent and mildly enlarged gastrohepatic ligament lymph nodes, largest 1.5 cm in short axis. No other adenopathy. Reproductive: Unremarkable. Other: No abdominal wall hernia or abnormality. No abdominopelvic ascites. Musculoskeletal: No acute or significant osseous findings. IMPRESSION: 1. Complex collection in the gallbladder fossa including bubbles of air, without change in size compared to the previous CT. Collection is consistent with an abscess, currently measuring 7.4 x 4.5 x 4.4 cm. Smaller collection lies along the anterolateral aspect of the right liver lobe extending just below the liver. There are associated inflammatory changes in the right upper quadrant centered on the gallbladder fossa. Percutaneous drainage tubes noted on the  prior CT have been removed. 2. Mildly enlarged gastrohepatic of ligament lymph nodes that are presumed reactive, and which are also stable from the prior CT. 3. No other acute abnormality within the abdomen or pelvis. 4. Lung base opacities most evident in the right lower lobe, improved from the prior CT, consistent with atelectasis. 5. Minimal aortic atherosclerosis. Scattered left colon diverticula. Electronically Signed   By: Lajean Manes M.D.   On: 02/13/2020 13:46   DG Chest Port 1 View  Result Date: 02/13/2020 CLINICAL DATA:  Sudden onset midsternal chest pain. EXAM: PORTABLE CHEST 1 VIEW COMPARISON:  01/20/2020. FINDINGS: Trachea is midline. Heart size stable. Lungs are somewhat low in volume with bibasilar subsegmental atelectasis. No airspace consolidation or pleural fluid. No pneumothorax. IMPRESSION: Mild bibasilar atelectasis.  No acute findings. Electronically Signed   By: Lorin Picket M.D.   On: 02/13/2020 10:28   CT IMAGE GUIDED DRAINAGE BY PERCUTANEOUS CATHETER  Result Date: 02/13/2020 INDICATION: Gallbladder fossa abscess EXAM: CT GUIDED DRAINAGE OF GALLBLADDER FOSSA ABSCESS MEDICATIONS: The patient is currently admitted to the hospital and receiving intravenous antibiotics. The antibiotics were administered within an appropriate time frame prior to the initiation of the procedure. ANESTHESIA/SEDATION: 2.0 mg IV  Versed 100 mcg IV Fentanyl Moderate Sedation Time:  15 minutes The patient was continuously monitored during the procedure by the interventional radiology nurse under my direct supervision. COMPLICATIONS: None immediate. TECHNIQUE: Informed written consent was obtained from the patient after a thorough discussion of the procedural risks, benefits and alternatives. All questions were addressed. Maximal Sterile Barrier Technique was utilized including caps, mask, sterile gowns, sterile gloves, sterile drape, hand hygiene and skin antiseptic. A timeout was performed prior to the  initiation of the procedure. PROCEDURE: Previous imaging reviewed. Patient positioned supine. Noncontrast localization CT performed. The gallbladder fossa abscess was localized and marked for a right upper quadrant intercostal approach. Under sterile conditions and local anesthesia, an 18 gauge 10 cm access needle was advanced from a right upper quadrant intercostal approach transhepatically into the abscess. Needle position confirmed with CT. Syringe aspiration yielded purulent fluid. Guidewire advanced followed by tract dilatation to insert a 12 [place] drain. Drain catheter position confirmed with CT. Syringe aspiration yielded 40 cc bilious exudative fluid. Sample sent for culture. Catheter secured with Prolene suture and connected to external suction bulb. Sterile dressing applied. No immediate complication. Patient tolerated the procedure well. FINDINGS: Imaging confirms needle placement into the gallbladder fossa abscess for drain placement IMPRESSION: Successful CT-guided gallbladder fossa abscess drain placement. Electronically Signed   By: Jerilynn Mages.  Shick M.D.   On: 02/13/2020 16:22    Labs:  CBC: Recent Labs    02/03/20 0311 02/04/20 0728 02/13/20 0945 02/14/20 0118  WBC 14.6* 9.9 25.8* 19.8*  HGB 12.4* 11.9* 13.8 12.4*  HCT 37.1* 35.6* 42.4 37.8*  PLT 408* 376 294 263    COAGS: Recent Labs    02/02/20 1902 02/02/20 2115  INR 1.2 1.1    BMP: Recent Labs    02/03/20 0311 02/04/20 0728 02/13/20 0945 02/14/20 0118  NA 133* 139 131* 134*  K 3.8 3.6 4.0 4.5  CL 103 106 100 99  CO2 22 21* 21* 26  GLUCOSE 131* 130* 237* 137*  BUN 10 12 22 19   CALCIUM 8.3* 8.4* 9.1 8.6*  CREATININE 0.84 0.79 1.00 0.85  GFRNONAA >60 >60 >60 >60  GFRAA >60 >60 >60 >60    LIVER FUNCTION TESTS: Recent Labs    02/03/20 0311 02/04/20 0728 02/13/20 0945 02/14/20 0118  BILITOT 1.4* 0.5 1.3* 1.1  AST 35 28 20 18   ALT 35 33 27 26  ALKPHOS 111 97 119 89  PROT 6.1* 5.8* 6.8 5.8*  ALBUMIN 2.4*  2.2* 2.7* 2.4*    Assessment and Plan:  Rt abd drain intact OP bloody- flushes easily Will need to flush with 5-10 cc sterile saline at home daily (please provide Rx at DC) Record OP Will hear from [facility] for OP follow up He is aware Will follow    Electronically Signed: Lavonia Drafts, PA-C 02/14/2020, 10:22 AM   I spent a total of 15 Minutes at the the patient's bedside AND on the patient's hospital floor or unit, greater than 50% of which was counseling/coordinating care for GB fossa abscess drain    Patient ID: Adam Fowler, male   DOB: 16-Apr-1956, 64 y.o.   MRN: [id number]

## 2020-02-14 NOTE — Discharge Instructions (Signed)
Surgical Drain Home Care Surgical drains are used to remove extra fluid that normally builds up in a surgical wound after surgery. A surgical drain helps to heal a surgical wound. Different kinds of surgical drains include:  Active drains. These drains use suction to pull drainage away from the surgical wound. Drainage flows through a tube to a container outside of the body. With these drains, you need to keep the bulb or the drainage container flat (compressed) at all times, except while you empty it. Flattening the bulb or container creates suction.  Passive drains. These drains allow fluid to drain naturally, by gravity. Drainage flows through a tube to a bandage (dressing) or a container outside of the body. Passive drains do not need to be emptied. A drain is placed during surgery. Right after surgery, drainage is usually bright red and a little thicker than water. The drainage may gradually turn yellow or pink and become thinner. It is likely that your health care provider will remove the drain when the drainage stops or when the amount decreases to 1-2 Tbsp (15-30 mL) during a 24-hour period. Supplies needed:  Tape.  Germ-free cleaning solution (sterile saline).  Cotton swabs.  Split gauze drain sponge: 4 x 4 inches (10 x 10 cm).  Gauze square: 4 x 4 inches (10 x 10 cm). How to care for your surgical drain Care for your drain as told by your health care provider. This is important to help prevent infection. If your drain is placed at your back, or any other hard-to-reach area, ask another person to assist you in performing the following tasks: General care  Keep the skin around the drain dry and covered with a dressing at all times.  Check your drain area every day for signs of infection. Check for: ? Redness, swelling, or pain. ? Pus or a bad smell. ? Cloudy drainage. ? Tenderness or pressure at the drain exit site. Changing the dressing Follow instructions from your health care  provider about how to change your dressing. Change your dressing at least once a day. Change it more often if needed to keep the dressing dry. Make sure you: 1. Gather your supplies. 2. Wash your hands with soap and water before you change your dressing. If soap and water are not available, use hand sanitizer. 3. Remove the old dressing. Avoid using scissors to do that. 4. Wash your hands with soap and water again after removing the old dressing. 5. Use sterile saline to clean your skin around the drain. You may need to use a cotton swab to clean the skin. 6. Place the tube through the slit in a drain sponge. Place the drain sponge so that it covers your wound. 7. Place the gauze square or another drain sponge on top of the drain sponge that is on the wound. Make sure the tube is between those layers. 8. Tape the dressing to your skin. 9. Tape the drainage tube to your skin 1-2 inches (2.5-5 cm) below the place where the tube enters your body. Taping keeps the tube from pulling on any stitches (sutures) that you have. 10. Wash your hands with soap and water. 11. Write down the color of your drainage and how often you change your dressing. How to empty your active drain  1. Make sure that you have a measuring cup that you can empty your drainage into. 2. Wash your hands with soap and water. If soap and water are not available, use hand sanitizer. 3.   Loosen any pins or clips that hold the tube in place. 4. If your health care provider tells you to strip the tube to prevent clots and tube blockages: ? Hold the tube at the skin with one hand. Use your other hand to pinch the tubing with your thumb and first finger. ? Gently move your fingers down the tube while squeezing very lightly. This clears any drainage, clots, or tissue from the tube. ? You may need to do this several times each day to keep the tube clear. Do not pull on the tube. 5. Open the bulb cap or the drain plug. Do not touch the  inside of the cap or the bottom of the plug. 6. Turn the device upside down and gently squeeze. 7. Empty all of the drainage into the measuring cup. 8. Compress the bulb or the container and replace the cap or the plug. To compress the bulb or the container, squeeze it firmly in the middle while you close the cap or plug the container. 9. Write down the amount of drainage that you have in each 24-hour period. If you have less than 2 Tbsp (30 mL) of drainage during 24 hours, contact your health care provider. 10. Flush the drainage down the toilet. 11. Wash your hands with soap and water. Contact a health care provider if:  You have redness, swelling, or pain around your drain area.  You have pus or a bad smell coming from your drain area.  You have a fever or chills.  The skin around your drain is warm to the touch.  The amount of drainage that you have is increasing instead of decreasing.  You have drainage that is cloudy.  There is a sudden stop or a sudden decrease in the amount of drainage that you have.  Your drain tube falls out.  Your active drain does not stay compressed after you empty it. Summary  Surgical drains are used to remove extra fluid that normally builds up in a surgical wound after surgery.  Different kinds of surgical drains include active drains and passive drains. Active drains use suction to pull drainage away from the surgical wound, and passive drains allow fluid to drain naturally.  It is important to care for your drain to prevent infection. If your drain is placed at your back, or any other hard-to-reach area, ask another person to assist you.  Contact your health care provider if you have redness, swelling, or pain around your drain area. This information is not intended to replace advice given to you by your health care provider. Make sure you discuss any questions you have with your health care provider. Document Revised: 09/29/2018 Document  Reviewed: 09/29/2018 Elsevier Patient Education  2020 [employer]  

## 2020-02-15 ENCOUNTER — Other Ambulatory Visit: Payer: Self-pay | Admitting: Surgery

## 2020-02-15 ENCOUNTER — Encounter (HOSPITAL_COMMUNITY): Payer: Self-pay

## 2020-02-15 ENCOUNTER — Ambulatory Visit (HOSPITAL_COMMUNITY): Payer: 59

## 2020-02-15 DIAGNOSIS — T8143XA Infection following a procedure, organ and space surgical site, initial encounter: Secondary | ICD-10-CM

## 2020-02-18 LAB — AEROBIC/ANAEROBIC CULTURE W GRAM STAIN (SURGICAL/DEEP WOUND): Special Requests: NORMAL

## 2020-03-01 ENCOUNTER — Ambulatory Visit
Admission: RE | Admit: 2020-03-01 | Discharge: 2020-03-01 | Disposition: A | Discharge status: Home / Self Care | Payer: 59 | Source: Ambulatory Visit | Attending: Surgery | Admitting: Surgery

## 2020-03-01 ENCOUNTER — Other Ambulatory Visit: Payer: Self-pay | Admitting: Surgery

## 2020-03-01 ENCOUNTER — Ambulatory Visit
Admission: RE | Admit: 2020-03-01 | Discharge: 2020-03-01 | Disposition: A | Discharge status: Home / Self Care | Payer: 59 | Source: Ambulatory Visit | Attending: Radiology | Admitting: Radiology

## 2020-03-01 DIAGNOSIS — T8143XA Infection following a procedure, organ and space surgical site, initial encounter: Secondary | ICD-10-CM

## 2020-03-01 HISTORY — PX: IR RADIOLOGIST EVAL & MGMT: IMG5224

## 2020-03-01 MED ORDER — IOPAMIDOL (ISOVUE-300) INJECTION 61%
100.0000 mL | Freq: Once | INTRAVENOUS | Status: AC | PRN
  Administered 2020-03-01: 100 mL via INTRAVENOUS

## 2020-03-01 NOTE — Progress Notes (Signed)
Chief Complaint: F/U GB Fossa drain  Referring Physician(s): Lovick  Supervising Physician: Sandi Mariscal  History of Present Illness: Adam Fowler is a 64 y.o. male who underwent subtotal cholecystectomy for gangrenous cholecystitis on 01/24/20 by Dr Bobbye Morton with placement of a JP drain.  He was seen as OP in the surgery clinic on 02/01/20 with purulent output so he was admitted for HIDA and IV abx.  CT on 5/27 showed = A complex 8.6 cm collection in the gallbladder fossa. This is likely related to the patient's reported subtotal cholecystectomy. Is unclear how much of this collection represents abscess or residual gallbladder. A surgical drain courses through the gallbladder fossa in likely through the residual gallbladder itself.   He was discharged on 02/04/20.  He presnted to the ED on 02/13/20 with increasing sharp pain in his chest and weakness/fatigue.  CT done 6/7 showed= Complex collection in the gallbladder fossa including bubbles of air, without change in size compared to the previous CT. Collection is consistent with an abscess, currently measuring 7.4 x 4.5 x 4.4 cm. Smaller collection lies along the anterolateral aspect of the right liver lobe extending just below the liver. There are associated inflammatory changes in the right upper quadrant centered on the gallbladder fossa. Percutaneous drainage tubes noted on the prior CT have been removed.  Drain placed in IR 6/7 by Dr. Annamaria Boots.  He is here today for follow up.  He is feeling better. No nausea/vomiting. No Fever/chills. ROS negative.   Past Medical History:  Diagnosis Date  . Hypertension   . Insomnia   . Kidney stones     Past Surgical History:  Procedure Laterality Date  . ARTHROSCOPIC REPAIR ACL     left, 2012; right 2004  . BILIARY STENT PLACEMENT  01/23/2020   Procedure: BILIARY STENT PLACEMENT;  Surgeon: Irving Copas., MD;  Location: [facility];  Service: Gastroenterology;;    . CHOLECYSTECTOMY N/A 01/24/2020   Procedure: LAPAROSCOPIC CHOLECYSTECTOMY;  Surgeon: Jesusita Oka, MD;  Location: [place];  Service: General;  Laterality: N/A;  . ERCP N/A 01/21/2020   Procedure: ENDOSCOPIC RETROGRADE CHOLANGIOPANCREATOGRAPHY (ERCP);  Surgeon: Ladene Artist, MD;  Location: [facility] ENDOSCOPY;  Service: Endoscopy;  Laterality: N/A;  . ERCP N/A 01/23/2020   Procedure: ENDOSCOPIC RETROGRADE CHOLANGIOPANCREATOGRAPHY (ERCP);  Surgeon: Irving Copas., MD;  Location: [place];  Service: Gastroenterology;  Laterality: N/A;  . PANCREATIC STENT PLACEMENT  01/21/2020   Procedure: PANCREATIC STENT PLACEMENT;  Surgeon: Ladene Artist, MD;  Location: [place];  Service: Endoscopy;;  . PANCREATIC STENT PLACEMENT  01/23/2020   Procedure: PANCREATIC STENT PLACEMENT;  Surgeon: Irving Copas., MD;  Location: [place];  Service: Gastroenterology;;  . [name]  01/23/2020   Procedure: [name];  Surgeon: Mansouraty, Telford Nab., MD;  Location: [place];  Service: Gastroenterology;;    Allergies: Patient has no known allergies.  Medications: Prior to Admission medications   Medication Sig Start Date End Date Taking? Authorizing Provider  amoxicillin-clavulanate (AUGMENTIN) 875-125 MG tablet Take 1 tablet by mouth every 12 (twelve) hours. 02/14/20   Maczis, Barth Kirks, PA-C  aspirin EC 81 MG tablet Take 81 mg by mouth 2 (two) times daily.    [provider]  Calcium Carb-Cholecalciferol (CALCIUM 1000 + D PO) Take 1 tablet by mouth daily.    [provider]  cetirizine (ZYRTEC) 10 MG tablet Take 10 mg by mouth daily. 02/08/20   [provider]  Coenzyme Q10 (COQ10) 400 MG CAPS Take  800 mg by mouth daily.    [provider]  Cyanocobalamin (VITAMIN B-12) 5000 MCG TBDP Take 5,000 mcg by mouth daily.    [provider]  dicyclomine (BENTYL) 10 MG capsule Take 10 mg by mouth every 6 (six) hours as needed for spasms.   01/16/20   [provider]  GLUCOSAMINE-CHONDROITIN PO Take 1 tablet by mouth 2 (two) times daily.    [provider]  Javier Docker Oil 1000 MG CAPS Take 2,000 mg by mouth every evening.     [provider]  Moringa Oleifera (MORINGA PO) Take 1,000 mg by mouth 2 (two) times daily.    [provider]  Multiple Vitamin (MULTIVITAMIN WITH MINERALS) TABS tablet Take 1 tablet by mouth daily.    [provider]  oxyCODONE (OXY IR/ROXICODONE) 5 MG immediate release tablet Take 1 tablet (5 mg total) by mouth every 6 (six) hours as needed for moderate pain or severe pain (pain not releived by tylenol or ibuprofen). Patient not taking: Reported on 02/13/2020 01/25/20   Jill Alexanders, PA-C  pravastatin (PRAVACHOL) 80 MG tablet Take 40 mg by mouth in the morning and at bedtime.    [provider]  Red Yeast Rice 600 MG CAPS Take 1,200 mg by mouth at bedtime.     [provider]  sertraline (ZOLOFT) 100 MG tablet Take 100 mg by mouth daily. 11/08/19   [provider]  sodium chloride flush (NS) 0.9 % SOLN 5 mLs by Intracatheter route daily. 02/14/20   Maczis, Barth Kirks, PA-C  TURMERIC PO Take 1,500 mg by mouth 2 (two) times daily.    [provider]  zolpidem (AMBIEN) 5 MG tablet Take 5 mg by mouth at bedtime. 12/29/19   [provider]     Family History  Problem Relation Age of Onset  . Colon cancer Neg Hx   . Stomach cancer Neg Hx     Social History   Socioeconomic History  . Marital status: Married    Spouse name: Not on file  . Number of children: Not on file  . Years of education: Not on file  . Highest education level: Not on file  Occupational History  . Not on file  Tobacco Use  . Smoking status: Never Smoker  . Smokeless tobacco: Never Used  Vaping Use  . Vaping Use: Never used  Substance and Sexual Activity  . Alcohol use: Yes    Alcohol/week: 2.0 standard drinks    Types: 2 Glasses of wine per week      Comment: occasional  . Drug use: No  . Sexual activity: Not Currently    Partners: Female  Other Topics Concern  . Not on file  Social History Narrative  . Not on file   Social Determinants of Health   Financial Resource Strain:   . Difficulty of Paying Living Expenses:   Food Insecurity:   . Worried About Charity fundraiser in the Last Year:   . Arboriculturist in the Last Year:   Transportation Needs:   . Film/video editor (Medical):   Marland Kitchen Lack of Transportation (Non-Medical):   Physical Activity:   . Days of Exercise per Week:   . Minutes of Exercise per Session:   Stress:   . Feeling of Stress :   Social Connections:   . Frequency of Communication with Friends and Family:   . Frequency of Social Gatherings with Friends and Family:   . Attends  Religious Services:   . Active Member of Clubs or Organizations:   . Attends Archivist Meetings:   Marland Kitchen Marital Status:      Review of Systems: A 12 point ROS discussed and pertinent positives are indicated in the HPI above.  All other systems are negative.  Review of Systems  Vital Signs: BP (!) 147/92   Pulse 67   Temp 98.1 F (36.7 C)   SpO2 99%   Physical Exam Constitutional:      Appearance: Normal appearance.  HENT:     Head: Normocephalic and atraumatic.  Cardiovascular:     Rate and Rhythm: Normal rate.  Pulmonary:     Effort: Pulmonary effort is normal. No respiratory distress.  Abdominal:     Palpations: Abdomen is soft.     Tenderness: There is no abdominal tenderness.     Comments: RUQ drain in place.  Skin:    General: Skin is warm and dry.  Neurological:     General: No focal deficit present.     Mental Status: He is alert and oriented to person, place, and time.  Psychiatric:        Mood and Affect: Mood normal.        Behavior: Behavior normal.        Thought Content: Thought content normal.        Judgment: Judgment normal.        Imaging: CT ABDOMEN PELVIS W  CONTRAST  Result Date: 02/13/2020 CLINICAL DATA:  Hypotension and elevated white blood cell count. Cholecystectomy 1 month ago. EXAM: CT ABDOMEN AND PELVIS WITH CONTRAST TECHNIQUE: Multidetector CT imaging of the abdomen and pelvis was performed using the standard protocol following bolus administration of intravenous contrast. CONTRAST:  166mL OMNIPAQUE IOHEXOL 300 MG/ML  SOLN COMPARISON:  02/02/2020 FINDINGS: Lower chest: There is a opacity at the base of the right lower lobe, with a smaller area of opacity at the base of the right middle lobe and mild linear opacity at the base of the left lower lobe. These opacities have improved compared to the prior CT, consistent with residual atelectasis. Heart normal in size. Hepatobiliary: Complex fluid collection containing bubbles of air lies in the gallbladder fossa, measuring 7.4 x 4.5 x 4.4 cm. Previously noted percutaneous drains have been removed. There is a sliver of fluid collecting adjacent anterolateral margin of the right liver lobe extending just below the liver, and there are hazy inflammatory changes in the right upper quadrant, centered on the gallbladder fossa. Liver is normal in size and attenuation. No masses or abscess. No intrahepatic bile duct dilation. Common bile duct is normal in caliber. Pancreas: Stable pancreatic duct stent. No pancreatic mass or inflammation. Spleen: Normal in size without focal abnormality. Adrenals/Urinary Tract: No adrenal masses. Kidneys normal in size, orientation and position with symmetric enhancement and excretion. No renal masses. Bilateral small nonobstructing intrarenal stones. No hydronephrosis. Normal ureters. Normal bladder. Stomach/Bowel: Normal stomach. Normal small bowel. Colon is normal in caliber. There left colon diverticula. No diverticulitis, colonic wall thickening or other inflammatory process. Normal appendix visualized. Vascular/Lymphatic: Minimal aortic atherosclerosis. No aneurysm. No other vascular  abnormality. Prominent and mildly enlarged gastrohepatic ligament lymph nodes, largest 1.5 cm in short axis. No other adenopathy. Reproductive: Unremarkable. Other: No abdominal wall hernia or abnormality. No abdominopelvic ascites. Musculoskeletal: No acute or significant osseous findings. IMPRESSION: 1. Complex collection in the gallbladder fossa including bubbles of air, without change in size compared to the previous CT. Collection is consistent  with an abscess, currently measuring 7.4 x 4.5 x 4.4 cm. Smaller collection lies along the anterolateral aspect of the right liver lobe extending just below the liver. There are associated inflammatory changes in the right upper quadrant centered on the gallbladder fossa. Percutaneous drainage tubes noted on the prior CT have been removed. 2. Mildly enlarged gastrohepatic of ligament lymph nodes that are presumed reactive, and which are also stable from the prior CT. 3. No other acute abnormality within the abdomen or pelvis. 4. Lung base opacities most evident in the right lower lobe, improved from the prior CT, consistent with atelectasis. 5. Minimal aortic atherosclerosis. Scattered left colon diverticula. Electronically Signed   By: Lajean Manes M.D.   On: 02/13/2020 13:46   CT ABDOMEN PELVIS W CONTRAST  Result Date: 02/02/2020 CLINICAL DATA:  Intra-abdominal abscess. EXAM: CT ABDOMEN AND PELVIS WITH CONTRAST TECHNIQUE: Multidetector CT imaging of the abdomen and pelvis was performed using the standard protocol following bolus administration of intravenous contrast. CONTRAST:  179mL OMNIPAQUE IOHEXOL 300 MG/ML  SOLN COMPARISON:  01/20/2020 FINDINGS: Lower chest: There are small bilateral pleural effusions, right greater than left. There is bibasilar atelectasis, right worse than left.The heart size is normal. Hepatobiliary: The liver is normal. There is a complex collection in the gallbladder fossa measuring 8.6 x 5.1 cm. A surgical drain courses through this  collection.There are pockets of gas within this collection. There is low attenuation material along the course of the surgical drain. This may represent surgical packing material. Pancreas: A plastic pancreatic duct stent is noted. Spleen: The spleen is enlarged measuring 13 cm craniocaudad. Adrenals/Urinary Tract: --Adrenal glands: Unremarkable. --Right kidney/ureter: Small nonobstructing stones are noted in the right kidney. --Left kidney/ureter: Small nonobstructing stones are noted in the upper pole the left kidney. --Urinary bladder: Unremarkable. Stomach/Bowel: --Stomach/Duodenum: No hiatal hernia or other gastric abnormality. Normal duodenal course and caliber. --Small bowel: Unremarkable. --Colon: There is scattered colonic diverticula without CT evidence for diverticulitis. There is some mild wall thickening of the hepatic flexure of the colon, favored to be reactive. --Appendix: Normal. Vascular/Lymphatic: Atherosclerotic calcification is present within the non-aneurysmal abdominal aorta, without hemodynamically significant stenosis. --No retroperitoneal lymphadenopathy. --No mesenteric lymphadenopathy. --No pelvic or inguinal lymphadenopathy. Reproductive: Unremarkable Other: No ascites or free air. The abdominal wall is normal. Musculoskeletal. No acute displaced fractures. IMPRESSION: 1. There is a complex 8.6 cm collection in the gallbladder fossa. This is likely related to the patient's reported subtotal cholecystectomy. Is unclear how much of this collection represents abscess or residual gallbladder. A surgical drain courses through the gallbladder fossa in likely through the residual gallbladder itself. There is low attenuation material surrounding the surgical drain within the gallbladder fossa. This is favored to represent surgical packing material and should be correlated with the patient's surgical history. There are persistent inflammatory changes in the right upper quadrant. 2. Small  bilateral pleural effusions, right greater than left, with bibasilar atelectasis, right worse than left. 3. Mild wall thickening of the hepatic flexure of the colon, favored to be reactive. 4. Splenomegaly. 5. Bilateral nonobstructive nephrolithiasis. 6. Pancreatic stent in place. Aortic Atherosclerosis (ICD10-I70.0). Electronically Signed   By: Constance Holster M.D.   On: 02/02/2020 23:51   DG Chest Port 1 View  Result Date: 02/13/2020 CLINICAL DATA:  Sudden onset midsternal chest pain. EXAM: PORTABLE CHEST 1 VIEW COMPARISON:  01/20/2020. FINDINGS: Trachea is midline. Heart size stable. Lungs are somewhat low in volume with bibasilar subsegmental atelectasis. No airspace consolidation or pleural fluid. No  pneumothorax. IMPRESSION: Mild bibasilar atelectasis.  No acute findings. Electronically Signed   By: Lorin Picket M.D.   On: 02/13/2020 10:28   CT IMAGE GUIDED DRAINAGE BY PERCUTANEOUS CATHETER  Result Date: 02/13/2020 INDICATION: Gallbladder fossa abscess EXAM: CT GUIDED DRAINAGE OF GALLBLADDER FOSSA ABSCESS MEDICATIONS: The patient is currently admitted to the hospital and receiving intravenous antibiotics. The antibiotics were administered within an appropriate time frame prior to the initiation of the procedure. ANESTHESIA/SEDATION: 2.0 mg IV Versed 100 mcg IV Fentanyl Moderate Sedation Time:  15 minutes The patient was continuously monitored during the procedure by the interventional radiology nurse under my direct supervision. COMPLICATIONS: None immediate. TECHNIQUE: Informed written consent was obtained from the patient after a thorough discussion of the procedural risks, benefits and alternatives. All questions were addressed. Maximal Sterile Barrier Technique was utilized including caps, mask, sterile gowns, sterile gloves, sterile drape, hand hygiene and skin antiseptic. A timeout was performed prior to the initiation of the procedure. PROCEDURE: Previous imaging reviewed. Patient positioned  supine. Noncontrast localization CT performed. The gallbladder fossa abscess was localized and marked for a right upper quadrant intercostal approach. Under sterile conditions and local anesthesia, an 18 gauge 10 cm access needle was advanced from a right upper quadrant intercostal approach transhepatically into the abscess. Needle position confirmed with CT. Syringe aspiration yielded purulent fluid. Guidewire advanced followed by tract dilatation to insert a 12 [place] drain. Drain catheter position confirmed with CT. Syringe aspiration yielded 40 cc bilious exudative fluid. Sample sent for culture. Catheter secured with Prolene suture and connected to external suction bulb. Sterile dressing applied. No immediate complication. Patient tolerated the procedure well. FINDINGS: Imaging confirms needle placement into the gallbladder fossa abscess for drain placement IMPRESSION: Successful CT-guided gallbladder fossa abscess drain placement. Electronically Signed   By: Jerilynn Mages.  Shick M.D.   On: 02/13/2020 16:22   NM HEPATO BILIARY LEAK  Result Date: 02/03/2020 CLINICAL DATA:  Status post cholecystectomy. Evaluate for biliary leak. EXAM: NUCLEAR MEDICINE HEPATOBILIARY IMAGING TECHNIQUE: Sequential images of the abdomen were obtained out to 60 minutes following intravenous administration of radiopharmaceutical. RADIOPHARMACEUTICALS:  5.2 mCi Tc-41m  Choletec IV COMPARISON:  3 CT 02/02/2020, HIDA scan 01/20/2020 FINDINGS: Prompt clearance radiotracer blood pool and homogeneous uptake in liver. Counts are evident the common bile duct and small bowel by 10 minutes. There is no extraluminal collection of radiotracer to suggest bile leak. Bile counts are noted to reflux into the stomach along the LEFT hepatic lobe. IMPRESSION: 1. No evidence of bowel leak. 2. Patent common bile duct Electronically Signed   By: Suzy Bouchard M.D.   On: 02/03/2020 13:18    Labs:  CBC: Recent Labs    02/03/20 0311 02/04/20 0728  02/13/20 0945 02/14/20 0118  WBC 14.6* 9.9 25.8* 19.8*  HGB 12.4* 11.9* 13.8 12.4*  HCT 37.1* 35.6* 42.4 37.8*  PLT 408* 376 294 263    COAGS: Recent Labs    02/02/20 1902 02/02/20 2115  INR 1.2 1.1    BMP: Recent Labs    02/03/20 0311 02/04/20 0728 02/13/20 0945 02/14/20 0118  NA 133* 139 131* 134*  K 3.8 3.6 4.0 4.5  CL 103 106 100 99  CO2 22 21* 21* 26  GLUCOSE 131* 130* 237* 137*  BUN 10 12 22 19   CALCIUM 8.3* 8.4* 9.1 8.6*  CREATININE 0.84 0.79 1.00 0.85  GFRNONAA >60 >60 >60 >60  GFRAA >60 >60 >60 >60    LIVER FUNCTION TESTS: Recent Labs    02/03/20  3142 02/04/20 0728 02/13/20 0945 02/14/20 0118  BILITOT 1.4* 0.5 1.3* 1.1  AST 35 28 20 18   ALT 35 33 27 26  ALKPHOS 111 97 119 89  PROT 6.1* 5.8* 6.8 5.8*  ALBUMIN 2.4* 2.2* 2.7* 2.4*    TUMOR MARKERS: No results for input(s): AFPTM, CEA, CA199, CHROMGRNA in the last 8760 hours.  Assessment/Plan:  Subtotal cholecystectomy for gangrenous cholecystitis 01/24/20-- Dr Bobbye Morton.  Post op GB fossa abscess = S/p Drain placed in IR 6/7 by Dr. Annamaria Boots.   Electronically Signed: Murrell Redden PA-C 03/01/2020, 1:39 PM    Please refer to Dr. Pascal Lux' attestation of this note for management and plan.

## 2020-03-02 ENCOUNTER — Telehealth: Payer: Self-pay

## 2020-03-02 NOTE — Telephone Encounter (Signed)
Mansouraty, Telford Nab., MD  Sandi Mariscal, MD; Jesusita Oka, MD; Timothy Lasso, RN JW & AL,  This sounds reasonable for PD stent removal and for then attempt at biliary stenting with potential fully covered metal stent to traverse the cystic leak.  I am going to be on secured leave for the next few weeks, starting on Monday.  I have availability on 7/21 but will try to make so moves of patients to try and get him in on 7/14 if possible.  I think this would be most reasonable.  He will need to keep the drain in a bit longer but should be worthwhile for him in long-run.   Filomena Pokorney,  Please work on scheduling this patient for 7/21 ERCP with PD stent removal and biliary stent placement for bile leak.  If possible, see if you can move one of the 7/14 Procedures to 7/21 and we can put this gentleman on the list.  Please let Dr. Pascal Lux and Dr. Bobbye Morton and I know which date we are able to place for him.  Thank you.  GM

## 2020-03-06 ENCOUNTER — Ambulatory Visit: Payer: 59 | Admitting: Gastroenterology

## 2020-03-14 NOTE — Telephone Encounter (Signed)
Move patient to August first available.  Ideally would be the first patient scheduled since she has had a resection and she is followup while other is still diagnostic procedures needed.  But either one so that I can do the ERCP in the 14th.  Thank you. GM

## 2020-03-14 NOTE — Telephone Encounter (Signed)
Dr Rush Landmark I believe you are off on 7/21 (your parents are coming).  Do you want me to move the pt on 7/14 to August or September.

## 2020-03-15 ENCOUNTER — Other Ambulatory Visit: Payer: Self-pay

## 2020-03-15 ENCOUNTER — Other Ambulatory Visit: Payer: 59

## 2020-03-15 DIAGNOSIS — Z9689 Presence of other specified functional implants: Secondary | ICD-10-CM

## 2020-03-15 DIAGNOSIS — K851 Biliary acute pancreatitis without necrosis or infection: Secondary | ICD-10-CM

## 2020-03-15 NOTE — Telephone Encounter (Signed)
The pt has been scheduled for ERCP on 7/14 and COVID test on 7/10.  The pt has been instructed and all questions answered.  Understanding verbalized.

## 2020-03-17 ENCOUNTER — Other Ambulatory Visit (HOSPITAL_COMMUNITY)
Admission: RE | Admit: 2020-03-17 | Discharge: 2020-03-17 | Disposition: A | Discharge status: Home / Self Care | Payer: 59 | Source: Ambulatory Visit | Attending: Gastroenterology | Admitting: Gastroenterology

## 2020-03-17 DIAGNOSIS — Z20822 Contact with and (suspected) exposure to covid-19: Secondary | ICD-10-CM | POA: Insufficient documentation

## 2020-03-17 DIAGNOSIS — Z01812 Encounter for preprocedural laboratory examination: Secondary | ICD-10-CM | POA: Insufficient documentation

## 2020-03-17 LAB — SARS CORONAVIRUS 2 (TAT 6-24 HRS): SARS Coronavirus 2: NEGATIVE

## 2020-03-20 ENCOUNTER — Other Ambulatory Visit: Payer: Self-pay

## 2020-03-20 ENCOUNTER — Encounter (HOSPITAL_COMMUNITY): Payer: Self-pay | Admitting: Gastroenterology

## 2020-03-21 ENCOUNTER — Encounter (HOSPITAL_COMMUNITY): Payer: Self-pay | Admitting: Gastroenterology

## 2020-03-21 ENCOUNTER — Ambulatory Visit (HOSPITAL_COMMUNITY): Payer: 59

## 2020-03-21 ENCOUNTER — Other Ambulatory Visit: Payer: Self-pay

## 2020-03-21 ENCOUNTER — Telehealth: Payer: Self-pay

## 2020-03-21 ENCOUNTER — Ambulatory Visit (HOSPITAL_COMMUNITY): Payer: 59 | Admitting: Anesthesiology

## 2020-03-21 ENCOUNTER — Encounter (HOSPITAL_COMMUNITY)
Admission: RE | Disposition: A | Discharge status: Home / Self Care | Payer: Self-pay | Source: Home / Self Care | Attending: Gastroenterology

## 2020-03-21 ENCOUNTER — Ambulatory Visit (HOSPITAL_COMMUNITY)
Admission: RE | Admit: 2020-03-21 | Discharge: 2020-03-21 | Disposition: A | Discharge status: Home / Self Care | Payer: 59 | Attending: Gastroenterology | Admitting: Gastroenterology

## 2020-03-21 DIAGNOSIS — I10 Essential (primary) hypertension: Secondary | ICD-10-CM | POA: Diagnosis not present

## 2020-03-21 DIAGNOSIS — R933 Abnormal findings on diagnostic imaging of other parts of digestive tract: Secondary | ICD-10-CM | POA: Insufficient documentation

## 2020-03-21 DIAGNOSIS — Z9689 Presence of other specified functional implants: Secondary | ICD-10-CM | POA: Insufficient documentation

## 2020-03-21 DIAGNOSIS — K839 Disease of biliary tract, unspecified: Secondary | ICD-10-CM

## 2020-03-21 DIAGNOSIS — K851 Biliary acute pancreatitis without necrosis or infection: Secondary | ICD-10-CM

## 2020-03-21 DIAGNOSIS — Z4659 Encounter for fitting and adjustment of other gastrointestinal appliance and device: Secondary | ICD-10-CM

## 2020-03-21 DIAGNOSIS — Z9049 Acquired absence of other specified parts of digestive tract: Secondary | ICD-10-CM | POA: Insufficient documentation

## 2020-03-21 DIAGNOSIS — K838 Other specified diseases of biliary tract: Secondary | ICD-10-CM | POA: Diagnosis not present

## 2020-03-21 HISTORY — PX: ENDOSCOPIC RETROGRADE CHOLANGIOPANCREATOGRAPHY (ERCP) WITH PROPOFOL: SHX5810

## 2020-03-21 HISTORY — PX: BILIARY STENT PLACEMENT: SHX5538

## 2020-03-21 HISTORY — DX: Personal history of urinary calculi: Z87.442

## 2020-03-21 HISTORY — PX: STENT REMOVAL: SHX6421

## 2020-03-21 SURGERY — ENDOSCOPIC RETROGRADE CHOLANGIOPANCREATOGRAPHY (ERCP) WITH PROPOFOL
Anesthesia: General

## 2020-03-21 MED ORDER — CIPROFLOXACIN HCL 500 MG PO TABS
500.0000 mg | ORAL_TABLET | Freq: Two times a day (BID) | ORAL | 0 refills | Status: AC
Start: 2020-03-21 — End: 2020-03-24

## 2020-03-21 MED ORDER — GLUCAGON HCL RDNA (DIAGNOSTIC) 1 MG IJ SOLR
INTRAMUSCULAR | Status: AC
  Filled 2020-03-21: qty 1

## 2020-03-21 MED ORDER — INDOMETHACIN 50 MG RE SUPP
RECTAL | Status: AC
  Filled 2020-03-21: qty 2

## 2020-03-21 MED ORDER — PHENYLEPHRINE 40 MCG/ML (10ML) SYRINGE FOR IV PUSH (FOR BLOOD PRESSURE SUPPORT)
PREFILLED_SYRINGE | INTRAVENOUS | Status: DC | PRN
  Administered 2020-03-21 (×2): 80 ug via INTRAVENOUS

## 2020-03-21 MED ORDER — DEXAMETHASONE SODIUM PHOSPHATE 10 MG/ML IJ SOLN
INTRAMUSCULAR | Status: DC | PRN
  Administered 2020-03-21: 10 mg via INTRAVENOUS

## 2020-03-21 MED ORDER — LIDOCAINE 2% (20 MG/ML) 5 ML SYRINGE
INTRAMUSCULAR | Status: DC | PRN
  Administered 2020-03-21: 100 mg via INTRAVENOUS

## 2020-03-21 MED ORDER — FENTANYL CITRATE (PF) 100 MCG/2ML IJ SOLN
INTRAMUSCULAR | Status: DC | PRN
  Administered 2020-03-21: 100 ug via INTRAVENOUS

## 2020-03-21 MED ORDER — CIPROFLOXACIN IN D5W 400 MG/200ML IV SOLN
INTRAVENOUS | Status: DC | PRN
  Administered 2020-03-21: 400 mg via INTRAVENOUS

## 2020-03-21 MED ORDER — INDOMETHACIN 50 MG RE SUPP
RECTAL | Status: DC | PRN
  Administered 2020-03-21: 100 mg via RECTAL

## 2020-03-21 MED ORDER — SUGAMMADEX SODIUM 200 MG/2ML IV SOLN
INTRAVENOUS | Status: DC | PRN
  Administered 2020-03-21: 200 mg via INTRAVENOUS

## 2020-03-21 MED ORDER — PROPOFOL 10 MG/ML IV BOLUS
INTRAVENOUS | Status: DC | PRN
  Administered 2020-03-21: 140 mg via INTRAVENOUS

## 2020-03-21 MED ORDER — ONDANSETRON HCL 4 MG/2ML IJ SOLN
INTRAMUSCULAR | Status: DC | PRN
  Administered 2020-03-21: 4 mg via INTRAVENOUS

## 2020-03-21 MED ORDER — CIPROFLOXACIN IN D5W 400 MG/200ML IV SOLN
INTRAVENOUS | Status: AC
  Filled 2020-03-21: qty 200

## 2020-03-21 MED ORDER — ROCURONIUM BROMIDE 10 MG/ML (PF) SYRINGE
PREFILLED_SYRINGE | INTRAVENOUS | Status: DC | PRN
  Administered 2020-03-21: 80 mg via INTRAVENOUS

## 2020-03-21 MED ORDER — GLUCAGON HCL RDNA (DIAGNOSTIC) 1 MG IJ SOLR
INTRAMUSCULAR | Status: DC | PRN
Start: 2020-03-21 — End: 2020-03-21
  Administered 2020-03-21 (×2): .25 mg via INTRAVENOUS

## 2020-03-21 MED ORDER — SODIUM CHLORIDE 0.9 % IV SOLN: INTRAVENOUS | Status: DC

## 2020-03-21 MED ORDER — MIDAZOLAM HCL 5 MG/5ML IJ SOLN
INTRAMUSCULAR | Status: DC | PRN
  Administered 2020-03-21: 2 mg via INTRAVENOUS

## 2020-03-21 MED ORDER — SODIUM CHLORIDE 0.9 % IV SOLN
INTRAVENOUS | Status: DC | PRN
  Administered 2020-03-21: 15 mL

## 2020-03-21 MED ORDER — LACTATED RINGERS IV SOLN: INTRAVENOUS | Status: DC | PRN

## 2020-03-21 NOTE — Transfer of Care (Signed)
Immediate Anesthesia Transfer of Care Note  Patient: Adam Fowler  Procedure(s) Performed: ENDOSCOPIC RETROGRADE CHOLANGIOPANCREATOGRAPHY (ERCP) WITH PROPOFOL (N/A ) STENT REMOVAL BILIARY STENT PLACEMENT  Patient Location: Endoscopy Unit  Anesthesia Type:General  Level of Consciousness: awake and patient cooperative  Airway & Oxygen Therapy: Patient Spontanous Breathing and Patient connected to nasal cannula oxygen  Post-op Assessment: Report given to RN, Post -op Vital signs reviewed and stable and Patient moving all extremities X 4  Post vital signs: Reviewed and stable  Last Vitals:  Vitals Value Taken Time  BP 153/86 03/21/20 1031  Temp    Pulse 87 03/21/20 1031  Resp 12 03/21/20 1031  SpO2 100 % 03/21/20 1031  Vitals shown include unvalidated device data.  Last Pain:  Vitals:   03/21/20 0842  TempSrc: Oral  PainSc: 0-No pain         Complications: No complications documented.

## 2020-03-21 NOTE — Op Note (Addendum)
[facility] Patient Name: Adam Fowler Procedure Date : 03/21/2020 MRN: [id number] Attending MD: Justice Britain , MD Date of Birth: 1955/09/14 CSN: [id number] Age: 64 Admit Type: Outpatient Procedure:                ERCP Indications:              Bile leak, Abnormal x-ray of gastrointestinal                            system, Follow-up of complication of previous                            biliary surgery, Prior Endoscopic Retrograde                            Cholangiopancreatography Providers:                Justice Britain, MD, Vista Lawman, RN, Laverda Sorenson, Technician, Luciana Axe, CRNA Referring MD:             Jesusita Oka, Estill Cotta Loletha Carrow, MD, Linus Mako. Watts Medicines:                General Anesthesia, Indomethacin 100 mg PR, Cipro                            400 mg IV, Glucagon 0.5 mg IV Complications:            No immediate complications. Estimated Blood Loss:     Estimated blood loss was minimal. Procedure:                Pre-Anesthesia Assessment:                           - Prior to the procedure, a History and Physical                            was performed, and patient medications and                            allergies were reviewed. The patient's tolerance of                            previous anesthesia was also reviewed. The risks                            and benefits of the procedure and the sedation                            options and risks were discussed with the patient.                            All questions were answered, and informed consent  was obtained. Prior Anticoagulants: The patient has                            taken no previous anticoagulant or antiplatelet                            agents except for aspirin. ASA Grade Assessment:                            III - A patient with severe systemic disease. After                            reviewing the risks and  benefits, the patient was                            deemed in satisfactory condition to undergo the                            procedure.                           After obtaining informed consent, the scope was                            passed under direct vision. Throughout the                            procedure, the patient's blood pressure, pulse, and                            oxygen saturations were monitored continuously. The                            [id number]-Q180V ([id number] Olympus Duodensocope was                            introduced through the mouth, and used to inject                            contrast into and used to inject contrast into the                            bile duct. The ERCP was accomplished without                            difficulty. The patient tolerated the procedure. Scope In: Scope Out: Findings:      The scout film was normal other than a RUQ percutaneous drain being in       place.      The esophagus was successfully intubated under direct vision without       detailed examination of the pharynx, larynx, and associated structures,       and upper GI tract. A biliary sphincterotomy had been performed. The       sphincterotomy appeared open. One temporary plastic pancreatic  stent       originating in the pancreatic duct was emerging from the major papilla       (still present from last ERCP).      Due to the difficulty of the procedure previously, I left the pancreatic       stent initially inplace. A short 0.035 inch Soft Jagwire was passed into       the biliary tree. The Autotome sphincterotome was passed over the       guidewire and the bile duct was then deeply cannulated. Contrast was       injected. I personally interpreted the bile duct images. Ductal flow of       contrast was adequate. Image quality was adequate. Contrast extended to       the hepatic ducts. Opacification of the entire biliary tree except for       the gallbladder was  successful. The maximum diameter of the ducts was       5-6 mm. Extravasation of contrast originating from the cystic duct was       observed at approximately 6 cm from the ampullary orifice based on       sphincterotome measurement. To discover objects, the biliary tree was       swept with a retrieval balloon starting at the bifurcation. Nothing was       found. An occlusion cholangiogram was performed that showed no further       significant biliary pathology. I had concern about using a 10 mm FCSEMS       due to duct size and we did not have an 8 mm FCSEMS to use today.       Decision made to proceed with plastic stenting. One 10 Fr by 7 cm       plastic biliary stent with a single external flap and a single internal       flap was placed into the common bile duct. Bile flowed through the       stent. The stent was in good position.      The pancreatic stent was removed from the pancreatic duct using a       regular forceps. A pancreatogram was not performed.      The duodenoscope was withdrawn from the patient. Impression:               - Prior biliary sphincterotomy appeared open.                           - Pancreatic stent from the pancreatic duct was                            seen in the major papilla. This was removed.                           - A bile leak was found at the cystic duct remnant.                           - The biliary tree was swept and nothing was found.                           - One plastic biliary stent was placed into the  common bile duct to aid in biliary leak closure. Recommendation:           - The patient will be observed post-procedure,                            until all discharge criteria are met.                           - Discharge patient to home.                           - Patient has a contact number available for                            emergencies. The signs and symptoms of potential                             delayed complications were discussed with the                            patient. Return to normal activities tomorrow.                            Written discharge instructions were provided to the                            patient.                           - Low fat diet for 1 week.                           - Observe patient's clinical course.                           - Watch for pancreatitis, bleeding, perforation,                            and cholangitis.                           - Ciprofloxacin 500 mg twice daily x 3-days to                            decrease risk of post-infectious ERCP complications.                           - Return to GI clinic in 2-weeks for liver                            biochemical testing.                           - Would follow up with IR in next 2-3 weeks as  already scheduled. Hopeful that with current                            biliary stent in place and with some time we see                            healing of the cystic duct leak. If the follow up                            drain evaluation shows no evidence of extravasation                            into the biliary tree then will plan to keep this                            stent in place for at least 4-6 weeks. If evidence                            of leak persists then likely repeat ERCP in 4-6                            weeks with ordering of an 8 mm x 6 cm FCSEMS for                            attempt at cystic duct enclosure with stent for                            further attempt at biliary leak closure.                           - The findings and recommendations were discussed                            with the patient.                           - The findings and recommendations were discussed                            with the patient's family. Procedure Code(s):        --- Professional ---                           [phone], Endoscopic retrograde                             cholangiopancreatography (ERCP); with placement of                            endoscopic stent into biliary or pancreatic duct,                            including pre- and post-dilation and guide wire  passage, when performed, including sphincterotomy,                            when performed, each stent Diagnosis Code(s):        --- Professional ---                           Z96.89, Presence of other specified functional                            implants                           K83.9, Disease of biliary tract, unspecified                           Z46.59, Encounter for fitting and adjustment of                            other gastrointestinal appliance and device                           K91.89, Other postprocedural complications and                            disorders of digestive system                           Z98.890, Other specified postprocedural states                           K83.8, Other specified diseases of biliary tract                           R93.3, Abnormal findings on diagnostic imaging of                            other parts of digestive tract CPT copyright 2019 [employer]. All rights reserved. The codes documented in this report are preliminary and upon coder review may  be revised to meet current compliance requirements. Justice Britain, MD 03/21/2020 10:37:44 AM Number of Addenda: 0

## 2020-03-21 NOTE — Anesthesia Preprocedure Evaluation (Addendum)
Anesthesia Evaluation  Patient identified by MRN, date of birth, ID band Patient awake    Reviewed: Allergy & Precautions, NPO status , Patient's Chart, lab work & pertinent test results  Airway Mallampati: I  TM Distance: >3 FB Neck ROM: Full    Dental  (+) Caps, Dental Advisory Given   Pulmonary    Pulmonary exam normal        Cardiovascular hypertension, Pt. on medications Normal cardiovascular exam     Neuro/Psych    GI/Hepatic   Endo/Other    Renal/GU      Musculoskeletal   Abdominal   Peds  Hematology   Anesthesia Other Findings   Reproductive/Obstetrics                            Anesthesia Physical Anesthesia Plan  ASA: III  Anesthesia Plan: General   Post-op Pain Management:    Induction: Intravenous  PONV Risk Score and Plan: 2  Airway Management Planned: Oral ETT  Additional Equipment:   Intra-op Plan:   Post-operative Plan: Extubation in OR  Informed Consent: I have reviewed the patients History and Physical, chart, labs and discussed the procedure including the risks, benefits and alternatives for the proposed anesthesia with the patient or authorized representative who has indicated his/her understanding and acceptance.       Plan Discussed with: CRNA and Surgeon  Anesthesia Plan Comments:         Anesthesia Quick Evaluation

## 2020-03-21 NOTE — Discharge Instructions (Signed)
YOU HAD AN ENDOSCOPIC PROCEDURE TODAY: Refer to the procedure report and other information in the discharge instructions given to you for any specific questions about what was found during the examination. If this information does not answer your questions, please call [place] office at [phone] to clarify.   YOU SHOULD EXPECT: Some feelings of bloating in the abdomen. Passage of more gas than usual. Walking can help get rid of the air that was put into your GI tract during the procedure and reduce the bloating. If you had a lower endoscopy (such as a colonoscopy or flexible sigmoidoscopy) you may notice spotting of blood in your stool or on the toilet paper. Some abdominal soreness may be present for a day or two, also.  DIET: Your first meal following the procedure should be a light meal and then it is ok to progress to your normal diet. A half-sandwich or bowl of soup is an example of a good first meal. Heavy or fried foods are harder to digest and may make you feel nauseous or bloated. Drink plenty of fluids but you should avoid alcoholic beverages for 24 hours. If you had a esophageal dilation, please see attached instructions for diet.    ACTIVITY: Your care partner should take you home directly after the procedure. You should plan to take it easy, moving slowly for the rest of the day. You can resume normal activity the day after the procedure however YOU SHOULD NOT DRIVE, use power tools, machinery or perform tasks that involve climbing or major physical exertion for 24 hours (because of the sedation medicines used during the test).   SYMPTOMS TO REPORT IMMEDIATELY: A gastroenterologist can be reached at any hour. Please call [phone]  for any of the following symptoms:   Following upper endoscopy (EGD, EUS, ERCP, esophageal dilation) Vomiting of blood or coffee ground material  New, significant abdominal pain  New, significant chest pain or pain under the shoulder blades  Painful or  persistently difficult swallowing  New shortness of breath  Black, tarry-looking or red, bloody stools  FOLLOW UP:  If any biopsies were taken you will be contacted by phone or by letter within the next 1-3 weeks. Call [phone]  if you have not heard about the biopsies in 3 weeks.  Please also call with any specific questions about appointments or follow up tests.  

## 2020-03-21 NOTE — Telephone Encounter (Signed)
The pt has been scheduled for ERCP with Dr Rush Landmark on 05/28/20 at 830 am at [facility].  COVID test scheduled for 9/16 at 10 am.  Lab test entered for 2 week follow up.  Left message on machine to call back

## 2020-03-21 NOTE — H&P (Signed)
GASTROENTEROLOGY PROCEDURE H&P NOTE   Primary Care Physician: Melony Overly, MD  HPI: Adam Fowler is a 64 y.o. male who presents for ERCP with attempt at bile duct leak closure after recent cholecystectomy with abscess s/p percutaneous drainage in place.  Past Medical History:  Diagnosis Date  . History of kidney stones    passed  . Hypertension   . Insomnia    Past Surgical History:  Procedure Laterality Date  . ARTHROSCOPIC REPAIR ACL Bilateral    left, 2012; right 2004  . BILIARY STENT PLACEMENT  01/23/2020   Procedure: BILIARY STENT PLACEMENT;  Surgeon: Irving Copas., MD;  Location: [place];  Service: Gastroenterology;;  . CHOLECYSTECTOMY N/A 01/24/2020   Procedure: LAPAROSCOPIC CHOLECYSTECTOMY;  Surgeon: Jesusita Oka, MD;  Location: [place];  Service: General;  Laterality: N/A;  . COLONOSCOPY    . ERCP N/A 01/21/2020   Procedure: ENDOSCOPIC RETROGRADE CHOLANGIOPANCREATOGRAPHY (ERCP);  Surgeon: Ladene Artist, MD;  Location: [facility] ENDOSCOPY;  Service: Endoscopy;  Laterality: N/A;  . ERCP N/A 01/23/2020   Procedure: ENDOSCOPIC RETROGRADE CHOLANGIOPANCREATOGRAPHY (ERCP);  Surgeon: Irving Copas., MD;  Location: [place];  Service: Gastroenterology;  Laterality: N/A;  . IR RADIOLOGIST EVAL & MGMT  03/01/2020  . PANCREATIC STENT PLACEMENT  01/21/2020   Procedure: PANCREATIC STENT PLACEMENT;  Surgeon: Ladene Artist, MD;  Location: [place];  Service: Endoscopy;;  . PANCREATIC STENT PLACEMENT  01/23/2020   Procedure: PANCREATIC STENT PLACEMENT;  Surgeon: Irving Copas., MD;  Location: [place];  Service: Gastroenterology;;  . [name]  01/23/2020   Procedure: [name];  Surgeon: Irving Copas., MD;  Location: [place];  Service: Gastroenterology;;   Current Facility-Administered Medications  Medication Dose Route Frequency Provider Last Rate Last Admin  . 0.9 %  sodium chloride infusion   Intravenous  Continuous Mansouraty, Telford Nab., MD       No Known Allergies Family History  Problem Relation Age of Onset  . Colon cancer Neg Hx   . Stomach cancer Neg Hx    Social History   Socioeconomic History  . Marital status: Married    Spouse name: Not on file  . Number of children: Not on file  . Years of education: Not on file  . Highest education level: Not on file  Occupational History  . Not on file  Tobacco Use  . Smoking status: Never Smoker  . Smokeless tobacco: Never Used  Vaping Use  . Vaping Use: Never used  Substance and Sexual Activity  . Alcohol use: Not Currently    Comment: occasional  . Drug use: No  . Sexual activity: Not Currently    Partners: Female  Other Topics Concern  . Not on file  Social History Narrative  . Not on file   Social Determinants of Health   Financial Resource Strain:   . Difficulty of Paying Living Expenses:   Food Insecurity:   . Worried About Charity fundraiser in the Last Year:   . Arboriculturist in the Last Year:   Transportation Needs:   . Film/video editor (Medical):   Marland Kitchen Lack of Transportation (Non-Medical):   Physical Activity:   . Days of Exercise per Week:   . Minutes of Exercise per Session:   Stress:   . Feeling of Stress :   Social Connections:   . Frequency of Communication with Friends and Family:   . Frequency of Social Gatherings with Friends and Family:   .  Attends Religious Services:   . Active Member of Clubs or Organizations:   . Attends Archivist Meetings:   Marland Kitchen Marital Status:   Intimate Partner Violence:   . Fear of Current or Ex-Partner:   . Emotionally Abused:   Marland Kitchen Physically Abused:   . Sexually Abused:     Physical Exam: Vital signs in last 24 hours: Weight:  [79.8 kg] 79.8 kg (07/13 1745)   GEN: NAD EYE: Sclerae anicteric ENT: MMM CV: Non-tachycardic GI: Soft, mild TTP in abdomen, Drain in place NEURO:  Alert & Oriented x 3  Lab Results: No results for input(s): WBC,  HGB, HCT, PLT in the last 72 hours. BMET No results for input(s): NA, K, CL, CO2, GLUCOSE, BUN, CREATININE, CALCIUM in the last 72 hours. LFT No results for input(s): PROT, ALBUMIN, AST, ALT, ALKPHOS, BILITOT, BILIDIR, IBILI in the last 72 hours. PT/INR No results for input(s): LABPROT, INR in the last 72 hours.   Impression / Plan: This is a 64 y.o.male who presents for ERCP with attempt at bile duct leak closure after recent cholecystectomy with abscess s/p percutaneous drainage in place.  The risks of an ERCP were discussed at length, including but not limited to the risk of perforation, bleeding, abdominal pain, post-ERCP pancreatitis (while usually mild can be severe and even life threatening).  The risks and benefits of endoscopic evaluation were discussed with the patient; these include but are not limited to the risk of perforation, infection, bleeding, missed lesions, lack of diagnosis, severe illness requiring hospitalization, as well as anesthesia and sedation related illnesses.  The patient is agreeable to proceed.    Justice Britain, MD [facility] Office # [phone]

## 2020-03-21 NOTE — Anesthesia Procedure Notes (Signed)
Procedure Name: Intubation Date/Time: 03/21/2020 8:47 AM Performed by: Jenne Campus, CRNA Pre-anesthesia Checklist: Patient identified, Emergency Drugs available, Suction available and Patient being monitored Patient Re-evaluated:Patient Re-evaluated prior to induction Oxygen Delivery Method: Circle System Utilized Preoxygenation: Pre-oxygenation with 100% oxygen Induction Type: IV induction Ventilation: Mask ventilation without difficulty Laryngoscope Size: Miller and 3 Grade View: Grade I Tube type: Oral Tube size: 7.5 mm Number of attempts: 1 Airway Equipment and Method: Stylet and Oral airway Placement Confirmation: ETT inserted through vocal cords under direct vision,  positive ETCO2 and breath sounds checked- equal and bilateral Secured at: 23 cm Tube secured with: Tape Dental Injury: Teeth and Oropharynx as per pre-operative assessment

## 2020-03-21 NOTE — Anesthesia Postprocedure Evaluation (Signed)
Anesthesia Post Note  Patient: Adam Fowler  Procedure(s) Performed: ENDOSCOPIC RETROGRADE CHOLANGIOPANCREATOGRAPHY (ERCP) WITH PROPOFOL (N/A ) [place] PLACEMENT     Patient location during evaluation: PACU Anesthesia Type: General Level of consciousness: awake and alert Pain management: pain level controlled Vital Signs Assessment: post-procedure vital signs reviewed and stable Respiratory status: spontaneous breathing, nonlabored ventilation, respiratory function stable and patient connected to nasal cannula oxygen Cardiovascular status: blood pressure returned to baseline and stable Postop Assessment: no apparent nausea or vomiting Anesthetic complications: no   No complications documented.  Last Vitals:  Vitals:   03/21/20 1100 03/21/20 1110  BP: 135/67 (!) 111/91  Pulse: 68 66  Resp: 12 13  Temp:    SpO2: 96% 97%    Last Pain:  Vitals:   03/21/20 1110  TempSrc:   PainSc: 0-No pain                 Dewon Mendizabal DAVID

## 2020-03-21 NOTE — Telephone Encounter (Signed)
-----   Message from Irving Copas., MD sent at 03/21/2020 10:37 AM EDT ----- Regarding: [facility] patient Adam Fowler, I hope you are both well. Cystic stump leak persists. I was concerned about leaving a 10 mm FCSEMS in the biliary duct due to the size of the duct.  I will have to special order an 8 mm or 6 mm FCSEMS for future use. My hope with the plastic stent that I placed is that it may be enough for healing of this region. John, I think patient is scheduled for follow up next week with you, but I think maybe another week or so (2 weeks from now) we would have a better sense if the current plastic stent arrangement will work on not. If you don't see any further contrast extravasation into the biliary tree at the time of your drain evaluation, I think pulling it will be reasonable. I plan a repeat ERCP no matter what in 6-weeks. If the leak persists and you need to keep the drain in, then I'll order the 6 mm or 8 mm FCSEMS to use at follow up ERCP. Thanks.  [name], patient needs a follow up ERCP with me in 6-weeks. HFP in next 2-weeks. F/U in clinic if he desires in 4-6 weeks weeks before ERCP should he want. Thanks. GM

## 2020-03-22 ENCOUNTER — Encounter (HOSPITAL_COMMUNITY): Payer: Self-pay | Admitting: Gastroenterology

## 2020-03-22 NOTE — Telephone Encounter (Signed)
The pt has been advised and instructed for ERCP as well as labs needed in 2 weeks.  All information has been mailed to the pt.  The pt has been advised of the information and verbalized understanding.

## 2020-03-28 ENCOUNTER — Other Ambulatory Visit: Payer: 59

## 2020-04-09 ENCOUNTER — Telehealth: Payer: Self-pay | Admitting: Gastroenterology

## 2020-04-09 NOTE — Telephone Encounter (Signed)
The pt wanted to see if there was any sooner appts available for his ERCP.  I did advise that we have no openings any sooner and if we have a cancellation we can call him.  He also had billing questions and I advised that he call the billing number on his bill.  The pt has been advised of the information and verbalized understanding.

## 2020-04-09 NOTE — Telephone Encounter (Signed)
Patient is calling in reference to the stent that was placed states the leakage into the bowl has not stopped. Patient is requesting to see if he can get the other stent replaced sooner.

## 2020-04-10 ENCOUNTER — Ambulatory Visit
Admission: RE | Admit: 2020-04-10 | Discharge: 2020-04-10 | Disposition: A | Discharge status: Home / Self Care | Payer: 59 | Source: Ambulatory Visit | Attending: Surgery | Admitting: Surgery

## 2020-04-10 ENCOUNTER — Other Ambulatory Visit: Payer: Self-pay | Admitting: Surgery

## 2020-04-10 ENCOUNTER — Other Ambulatory Visit (INDEPENDENT_AMBULATORY_CARE_PROVIDER_SITE_OTHER): Payer: 59

## 2020-04-10 DIAGNOSIS — Z9689 Presence of other specified functional implants: Secondary | ICD-10-CM

## 2020-04-10 DIAGNOSIS — T8143XA Infection following a procedure, organ and space surgical site, initial encounter: Secondary | ICD-10-CM

## 2020-04-10 DIAGNOSIS — K851 Biliary acute pancreatitis without necrosis or infection: Secondary | ICD-10-CM | POA: Diagnosis not present

## 2020-04-10 HISTORY — PX: IR RADIOLOGIST EVAL & MGMT: IMG5224

## 2020-04-10 LAB — HEPATIC FUNCTION PANEL
ALT: 26 U/L (ref 0–53)
AST: 21 U/L (ref 0–37)
Albumin: 4.5 g/dL (ref 3.5–5.2)
Alkaline Phosphatase: 75 U/L (ref 39–117)
Bilirubin, Direct: 0.2 mg/dL (ref 0.0–0.3)
Total Bilirubin: 1.5 mg/dL — ABNORMAL HIGH (ref 0.2–1.2)
Total Protein: 7.3 g/dL (ref 6.0–8.3)

## 2020-04-10 NOTE — Progress Notes (Signed)
Chief Complaint: F/U GB Fossa drain  Referring Physician(s): Lov[place]  Supervising Physician: Sandi Mariscal  History of Present Illness: Adam Fowler is a 64 y.o. male who underwent subtotal cholecystectomy for gangrenous cholecystitis on 01/24/20 by Dr Bobbye Morton with placement of a JP drain.  He was seen as OP in the surgery clinic on 02/01/20 with purulent output so he was admitted for HIDA and IV abx.  CT on 5/27 showed = A complex 8.6 cm collection in the gallbladder fossa. This is likely related to the patient's reported subtotal cholecystectomy. Is unclear how much of this collection represents abscess or residual gallbladder. A surgical drain courses through the gallbladder fossa in likely through the residual gallbladder itself.   He was discharged on 02/04/20.  He presnted to the ED on 02/13/20 with increasing sharp pain in his chest and weakness/fatigue.  CT done 6/7 showed= Complex collection in the gallbladder fossa including bubbles of air, without change in size compared to the previous CT. Collection is consistent with an abscess, currently measuring 7.4 x 4.5 x 4.4 cm. Smaller collection lies along the anterolateral aspect of the right liver lobe extending just below the liver. There are associated inflammatory changes in the right upper quadrant centered on the gallbladder fossa. Percutaneous drainage tubes noted on the prior CT have been removed.  Drain placed in IR 6/7 by Dr. Annamaria Boots.  Drain injection on 6/24 showed communication to cystic duct, ultimately filling the CBD and duodenum. He has since had ERCP with CBD stent placement on 7/14  Pt feels good, still reports bilious output into bulb.  Here for repeat drain injection   Past Medical History:  Diagnosis Date   History of kidney stones    passed   Hypertension    Insomnia     Past Surgical History:  Procedure Laterality Date   ARTHROSCOPIC REPAIR ACL Bilateral    left, 2012; right 2004    BILIARY STENT PLACEMENT  01/23/2020   Procedure: BILIARY STENT PLACEMENT;  Surgeon: Irving Copas., MD;  Location: [place];  Service: Gastroenterology;;   BILIARY STENT PLACEMENT  03/21/2020   Procedure: BILIARY STENT PLACEMENT;  Surgeon: Irving Copas., MD;  Location: [place];  Service: Gastroenterology;;   CHOLECYSTECTOMY N/A 01/24/2020   Procedure: LAPAROSCOPIC CHOLECYSTECTOMY;  Surgeon: Jesusita Oka, MD;  Location: [place];  Service: General;  Laterality: N/A;   COLONOSCOPY     ENDOSCOPIC RETROGRADE CHOLANGIOPANCREATOGRAPHY (ERCP) WITH PROPOFOL N/A 03/21/2020   Procedure: ENDOSCOPIC RETROGRADE CHOLANGIOPANCREATOGRAPHY (ERCP) WITH PROPOFOL;  Surgeon: Irving Copas., MD;  Location: [place];  Service: Gastroenterology;  Laterality: N/A;   ERCP N/A 01/21/2020   Procedure: ENDOSCOPIC RETROGRADE CHOLANGIOPANCREATOGRAPHY (ERCP);  Surgeon: Ladene Artist, MD;  Location: [facility] ENDOSCOPY;  Service: Endoscopy;  Laterality: N/A;   ERCP N/A 01/23/2020   Procedure: ENDOSCOPIC RETROGRADE CHOLANGIOPANCREATOGRAPHY (ERCP);  Surgeon: Irving Copas., MD;  Location: [place];  Service: Gastroenterology;  Laterality: N/A;   IR RADIOLOGIST EVAL & MGMT  03/01/2020   PANCREATIC STENT PLACEMENT  01/21/2020   Procedure: PANCREATIC STENT PLACEMENT;  Surgeon: Ladene Artist, MD;  Location: [place];  Service: Endoscopy;;   PANCREATIC STENT PLACEMENT  01/23/2020   Procedure: PANCREATIC STENT PLACEMENT;  Surgeon: Irving Copas., MD;  Location: [place];  Service: Gastroenterology;;   [name]  01/23/2020   Procedure: [name];  Surgeon: Irving Copas., MD;  Location: [place];  Service: Gastroenterology;;   STENT REMOVAL  03/21/2020   Procedure: STENT REMOVAL;  Surgeon: Rush Landmark Telford Nab., MD;  Location: [place];  Service: Gastroenterology;;    Allergies: Patient has no known  allergies.  Medications: Prior to Admission medications   Medication Sig Start Date End Date Taking? Authorizing Provider  amoxicillin-clavulanate (AUGMENTIN) 875-125 MG tablet Take 1 tablet by mouth every 12 (twelve) hours. 02/14/20   Maczis, Barth Kirks, PA-C  aspirin EC 81 MG tablet Take 81 mg by mouth 2 (two) times daily.    [provider]  Calcium Carb-Cholecalciferol (CALCIUM 1000 + D PO) Take 1 tablet by mouth daily.    [provider]  cetirizine (ZYRTEC) 10 MG tablet Take 10 mg by mouth daily. 02/08/20   [provider]  Coenzyme Q10 (COQ10) 400 MG CAPS Take 800 mg by mouth daily.    [provider]  Cyanocobalamin (VITAMIN B-12) 5000 MCG TBDP Take 5,000 mcg by mouth daily.    [provider]  dicyclomine (BENTYL) 10 MG capsule Take 10 mg by mouth every 6 (six) hours as needed for spasms.  01/16/20   [provider]  GLUCOSAMINE-CHONDROITIN PO Take 1 tablet by mouth 2 (two) times daily.    [provider]  Javier Docker Oil 1000 MG CAPS Take 2,000 mg by mouth every evening.     [provider]  Moringa Oleifera (MORINGA PO) Take 1,000 mg by mouth 2 (two) times daily.    [provider]  Multiple Vitamin (MULTIVITAMIN WITH MINERALS) TABS tablet Take 1 tablet by mouth daily.    [provider]  oxyCODONE (OXY IR/ROXICODONE) 5 MG immediate release tablet Take 1 tablet (5 mg total) by mouth every 6 (six) hours as needed for moderate pain or severe pain (pain not releived by tylenol or ibuprofen). Patient not taking: Reported on 02/13/2020 01/25/20   Jill Alexanders, PA-C  pravastatin (PRAVACHOL) 80 MG tablet Take 40 mg by mouth in the morning and at bedtime.    [provider]  Red Yeast Rice 600 MG CAPS Take 1,200 mg by mouth at bedtime.     [provider]  sertraline (ZOLOFT) 100 MG tablet Take 100 mg by mouth daily. 11/08/19   [provider]  sodium chloride flush (NS) 0.9 % SOLN 5 mLs  by Intracatheter route daily. 02/14/20   Maczis, Barth Kirks, PA-C  TURMERIC PO Take 1,500 mg by mouth 2 (two) times daily.    [provider]  zolpidem (AMBIEN) 5 MG tablet Take 5 mg by mouth at bedtime. 12/29/19   [provider]     Family History  Problem Relation Age of Onset   Colon cancer Neg Hx    Stomach cancer Neg Hx     Social History   Socioeconomic History   Marital status: Married    Spouse name: Not on file   Number of children: Not on file   Years of education: Not on file   Highest education level: Not on file  Occupational History   Not on file  Tobacco Use   Smoking status: Never Smoker   Smokeless tobacco: Never Used  Vaping Use   Vaping Use: Never used  Substance and Sexual Activity   Alcohol use: Not Currently    Comment: occasional   Drug use: No   Sexual activity: Not Currently    Partners: Female  Other Topics Concern   Not on file  Social History Narrative   Not on file   Social Determinants of Health   Financial Resource Strain:  Difficulty of Paying Living Expenses:   Food Insecurity:    Worried About Charity fundraiser in the Last Year:    Arboriculturist in the Last Year:   Transportation Needs:    Film/video editor (Medical):    Lack of Transportation (Non-Medical):   Physical Activity:    Days of Exercise per Week:    Minutes of Exercise per Session:   Stress:    Feeling of Stress :   Social Connections:    Frequency of Communication with Friends and Family:    Frequency of Social Gatherings with Friends and Family:    Attends Religious Services:    Active Member of Clubs or Organizations:    Attends Archivist Meetings:    Marital Status:      Review of Systems: A 12 point ROS discussed and pertinent positives are indicated in the HPI above.  All other systems are negative.  Review of Systems  Vital Signs: There were no vitals taken for this  visit.  Physical Exam Constitutional:      Appearance: Normal appearance.  HENT:     Head: Normocephalic and atraumatic.  Cardiovascular:     Rate and Rhythm: Normal rate.  Pulmonary:     Effort: Pulmonary effort is normal. No respiratory distress.  Abdominal:     Palpations: Abdomen is soft.     Tenderness: There is no abdominal tenderness.     Comments: RUQ drain in place. Bilious output  Skin:    General: Skin is warm and dry.  Neurological:     General: No focal deficit present.     Mental Status: He is alert and oriented to person, place, and time.  Psychiatric:        Mood and Affect: Mood normal.        Thought Content: Thought content normal.        Judgment: Judgment normal.        Imaging: DG ERCP  Result Date: 03/21/2020 CLINICAL DATA:  64 year old male with a history of choledocholithiasis EXAM: ERCP TECHNIQUE: Multiple spot images obtained with the fluoroscopic device and submitted for interpretation post-procedure. FLUOROSCOPY TIME:  Fluoroscopy Time:  3 minutes 10 seconds COMPARISON:  None. FINDINGS: Limited intraoperative fluoroscopic spot images during ERCP. Initial image demonstrates endoscope projecting over the upper abdomen. Subsequently there is cannulation of the ampulla and retrograde infusion of contrast partially opacifying the extrahepatic biliary ducts. Final image demonstrates placement of a plastic biliary stent. IMPRESSION: Limited images during ERCP demonstrates placement of a plastic biliary stent. Please refer to the dictated operative report for full details of intraoperative findings and procedure. Electronically Signed   By: Corrie Mckusick D.O.   On: 03/21/2020 15:29    Labs:  CBC: Recent Labs    02/03/20 0311 02/04/20 0728 02/13/20 0945 02/14/20 0118  WBC 14.6* 9.9 25.8* 19.8*  HGB 12.4* 11.9* 13.8 12.4*  HCT 37.1* 35.6* 42.4 37.8*  PLT 408* 376 294 263    COAGS: Recent Labs    02/02/20 1902 02/02/20 2115  INR 1.2 1.1     BMP: Recent Labs    02/03/20 0311 02/04/20 0728 02/13/20 0945 02/14/20 0118  NA 133* 139 131* 134*  K 3.8 3.6 4.0 4.5  CL 103 106 100 99  CO2 22 21* 21* 26  GLUCOSE 131* 130* 237* 137*  BUN 10 12 22 19   CALCIUM 8.3* 8.4* 9.1 8.6*  CREATININE 0.84 0.79 1.00 0.85  GFRNONAA >60 >60 >60 >60  GFRAA >60 >60 >60 >60    LIVER FUNCTION TESTS: Recent Labs    02/03/20 0311 02/04/20 0728 02/13/20 0945 02/14/20 0118  BILITOT 1.4* 0.5 1.3* 1.1  AST 35 28 20 18   ALT 35 33 27 26  ALKPHOS 111 97 119 89  PROT 6.1* 5.8* 6.8 5.8*  ALBUMIN 2.4* 2.2* 2.7* 2.4*    TUMOR MARKERS: No results for input(s): AFPTM, CEA, CA199, CHROMGRNA in the last 8760 hours.  Assessment/Plan: Subtotal cholecystectomy for gangrenous cholecystitis 01/24/20-- Dr Bobbye Morton.  Post op GB fossa abscess = S/p Drain placed in IR 6/7 by Dr. Annamaria Boots.  ERCP with CBD stent 7/14  Drain injection today again shows patent communication to cystic duct with filling of CBD and duodenum.  Will tentatively plan repeat injection in about a month unless clinical changes. He states he has repeat ERCP scheduled for September.   Electronically Signed: Ascencion Dike PA-C 04/10/2020, 1:37 PM

## 2020-04-11 ENCOUNTER — Other Ambulatory Visit: Payer: Self-pay

## 2020-04-11 DIAGNOSIS — K851 Biliary acute pancreatitis without necrosis or infection: Secondary | ICD-10-CM

## 2020-04-11 DIAGNOSIS — Z9689 Presence of other specified functional implants: Secondary | ICD-10-CM

## 2020-04-21 ENCOUNTER — Other Ambulatory Visit (HOSPITAL_COMMUNITY)
Admission: RE | Admit: 2020-04-21 | Discharge: 2020-04-21 | Disposition: A | Discharge status: Home / Self Care | Payer: 59 | Source: Ambulatory Visit | Attending: Gastroenterology | Admitting: Gastroenterology

## 2020-04-21 DIAGNOSIS — Z20822 Contact with and (suspected) exposure to covid-19: Secondary | ICD-10-CM | POA: Diagnosis not present

## 2020-04-21 DIAGNOSIS — Z01812 Encounter for preprocedural laboratory examination: Secondary | ICD-10-CM | POA: Insufficient documentation

## 2020-04-21 LAB — SARS CORONAVIRUS 2 (TAT 6-24 HRS): SARS Coronavirus 2: NEGATIVE

## 2020-04-24 ENCOUNTER — Other Ambulatory Visit: Payer: 59

## 2020-04-24 ENCOUNTER — Encounter (HOSPITAL_COMMUNITY): Payer: Self-pay | Admitting: Gastroenterology

## 2020-04-24 ENCOUNTER — Other Ambulatory Visit: Payer: Self-pay

## 2020-04-24 NOTE — Progress Notes (Signed)
Spoke with pt for pre-op call. Pt denies cardiac history or Diabetes. He is treated for HTN.  Covid test done 04/21/20 and it's negative. He states he's been in quarantine since the test was done and understands that he stays in quarantine until he comes to the hospital tomorrow.   EKG - 02/13/20

## 2020-04-25 ENCOUNTER — Encounter (HOSPITAL_COMMUNITY): Payer: Self-pay | Admitting: Emergency Medicine

## 2020-04-25 ENCOUNTER — Ambulatory Visit (HOSPITAL_COMMUNITY): Payer: 59 | Admitting: Certified Registered Nurse Anesthetist

## 2020-04-25 ENCOUNTER — Emergency Department (HOSPITAL_COMMUNITY)
Admission: EM | Admit: 2020-04-25 | Discharge: 2020-04-26 | Disposition: A | Discharge status: Home / Self Care | Payer: 59 | Source: Home / Self Care

## 2020-04-25 ENCOUNTER — Ambulatory Visit (HOSPITAL_COMMUNITY): Payer: 59

## 2020-04-25 ENCOUNTER — Other Ambulatory Visit: Payer: Self-pay

## 2020-04-25 ENCOUNTER — Telehealth: Payer: Self-pay | Admitting: Internal Medicine

## 2020-04-25 ENCOUNTER — Encounter (HOSPITAL_COMMUNITY): Payer: Self-pay | Admitting: Gastroenterology

## 2020-04-25 ENCOUNTER — Telehealth: Payer: Self-pay | Admitting: Gastroenterology

## 2020-04-25 ENCOUNTER — Encounter (HOSPITAL_COMMUNITY)
Admission: RE | Disposition: A | Discharge status: Home / Self Care | Payer: Self-pay | Source: Home / Self Care | Attending: Gastroenterology

## 2020-04-25 ENCOUNTER — Ambulatory Visit (HOSPITAL_BASED_OUTPATIENT_CLINIC_OR_DEPARTMENT_OTHER)
Admission: RE | Admit: 2020-04-25 | Discharge: 2020-04-25 | Disposition: A | Discharge status: Home / Self Care | Payer: 59 | Source: Home / Self Care | Attending: Gastroenterology | Admitting: Gastroenterology

## 2020-04-25 DIAGNOSIS — K839 Disease of biliary tract, unspecified: Secondary | ICD-10-CM

## 2020-04-25 DIAGNOSIS — Z4659 Encounter for fitting and adjustment of other gastrointestinal appliance and device: Secondary | ICD-10-CM | POA: Insufficient documentation

## 2020-04-25 DIAGNOSIS — R109 Unspecified abdominal pain: Secondary | ICD-10-CM

## 2020-04-25 DIAGNOSIS — K9189 Other postprocedural complications and disorders of digestive system: Secondary | ICD-10-CM | POA: Insufficient documentation

## 2020-04-25 DIAGNOSIS — I10 Essential (primary) hypertension: Secondary | ICD-10-CM | POA: Insufficient documentation

## 2020-04-25 DIAGNOSIS — K838 Other specified diseases of biliary tract: Secondary | ICD-10-CM

## 2020-04-25 DIAGNOSIS — Z9689 Presence of other specified functional implants: Secondary | ICD-10-CM

## 2020-04-25 DIAGNOSIS — K851 Biliary acute pancreatitis without necrosis or infection: Secondary | ICD-10-CM

## 2020-04-25 DIAGNOSIS — Z5321 Procedure and treatment not carried out due to patient leaving prior to being seen by health care provider: Secondary | ICD-10-CM | POA: Insufficient documentation

## 2020-04-25 DIAGNOSIS — A4159 Other Gram-negative sepsis: Secondary | ICD-10-CM | POA: Diagnosis not present

## 2020-04-25 DIAGNOSIS — R112 Nausea with vomiting, unspecified: Secondary | ICD-10-CM | POA: Insufficient documentation

## 2020-04-25 HISTORY — PX: BILIARY STENT PLACEMENT: SHX5538

## 2020-04-25 HISTORY — PX: STENT REMOVAL: SHX6421

## 2020-04-25 HISTORY — PX: ENDOSCOPIC RETROGRADE CHOLANGIOPANCREATOGRAPHY (ERCP) WITH PROPOFOL: SHX5810

## 2020-04-25 LAB — URINALYSIS, ROUTINE W REFLEX MICROSCOPIC
Bacteria, UA: NONE SEEN
Bilirubin Urine: NEGATIVE
Glucose, UA: NEGATIVE mg/dL
Hgb urine dipstick: NEGATIVE
Ketones, ur: 5 mg/dL — AB
Leukocytes,Ua: NEGATIVE
Nitrite: NEGATIVE
Protein, ur: 30 mg/dL — AB
Specific Gravity, Urine: 1.026 (ref 1.005–1.030)
pH: 5 (ref 5.0–8.0)

## 2020-04-25 LAB — COMPREHENSIVE METABOLIC PANEL
ALT: 43 U/L (ref 0–44)
AST: 51 U/L — ABNORMAL HIGH (ref 15–41)
Albumin: 4.1 g/dL (ref 3.5–5.0)
Alkaline Phosphatase: 58 U/L (ref 38–126)
Anion gap: 16 — ABNORMAL HIGH (ref 5–15)
BUN: 24 mg/dL — ABNORMAL HIGH (ref 8–23)
CO2: 23 mmol/L (ref 22–32)
Calcium: 9.3 mg/dL (ref 8.9–10.3)
Chloride: 100 mmol/L (ref 98–111)
Creatinine, Ser: 1.32 mg/dL — ABNORMAL HIGH (ref 0.61–1.24)
GFR calc Af Amer: >60 mL/min (ref >60)
GFR calc non Af Amer: 57 mL/min — ABNORMAL LOW (ref >60)
Glucose, Bld: 223 mg/dL — ABNORMAL HIGH (ref 70–99)
Potassium: 4.8 mmol/L (ref 3.5–5.1)
Sodium: 139 mmol/L (ref 135–145)
Total Bilirubin: 3 mg/dL — ABNORMAL HIGH (ref 0.3–1.2)
Total Protein: 6.9 g/dL (ref 6.5–8.1)

## 2020-04-25 LAB — CBC
HCT: 53.3 % — ABNORMAL HIGH (ref 39.0–52.0)
Hemoglobin: 17.7 g/dL — ABNORMAL HIGH (ref 13.0–17.0)
MCH: 28.5 pg (ref 26.0–34.0)
MCHC: 33.2 g/dL (ref 30.0–36.0)
MCV: 85.8 fL (ref 80.0–100.0)
Platelets: 226 10*3/uL (ref 150–400)
RBC: 6.21 MIL/uL — ABNORMAL HIGH (ref 4.22–5.81)
RDW: 14.9 % (ref 11.5–15.5)
WBC: 21.4 10*3/uL — ABNORMAL HIGH (ref 4.0–10.5)
nRBC: 0 % (ref 0.0–0.2)

## 2020-04-25 LAB — LIPASE, BLOOD: Lipase: 1431 U/L — ABNORMAL HIGH (ref 11–51)

## 2020-04-25 SURGERY — ENDOSCOPIC RETROGRADE CHOLANGIOPANCREATOGRAPHY (ERCP) WITH PROPOFOL
Anesthesia: General

## 2020-04-25 MED ORDER — GLUCAGON HCL RDNA (DIAGNOSTIC) 1 MG IJ SOLR
INTRAMUSCULAR | Status: AC
  Filled 2020-04-25: qty 1

## 2020-04-25 MED ORDER — MIDAZOLAM HCL (PF) 5 MG/ML IJ SOLN
INTRAMUSCULAR | Status: DC | PRN
Start: 2020-04-25 — End: 2020-04-25
  Administered 2020-04-25: 2 mg via INTRAVENOUS

## 2020-04-25 MED ORDER — ONDANSETRON HCL 4 MG/2ML IJ SOLN
INTRAMUSCULAR | Status: DC | PRN
  Administered 2020-04-25: 4 mg via INTRAVENOUS

## 2020-04-25 MED ORDER — SUGAMMADEX SODIUM 200 MG/2ML IV SOLN
INTRAVENOUS | Status: DC | PRN
  Administered 2020-04-25: 200 mg via INTRAVENOUS

## 2020-04-25 MED ORDER — LACTATED RINGERS IV SOLN: INTRAVENOUS | Status: DC

## 2020-04-25 MED ORDER — SODIUM CHLORIDE 0.9 % IV SOLN: INTRAVENOUS | Status: DC

## 2020-04-25 MED ORDER — CIPROFLOXACIN IN D5W 400 MG/200ML IV SOLN
INTRAVENOUS | Status: DC | PRN
Start: 2020-04-25 — End: 2020-04-25
  Administered 2020-04-25: 400 mg via INTRAVENOUS

## 2020-04-25 MED ORDER — PHENYLEPHRINE 40 MCG/ML (10ML) SYRINGE FOR IV PUSH (FOR BLOOD PRESSURE SUPPORT)
PREFILLED_SYRINGE | INTRAVENOUS | Status: DC | PRN
  Administered 2020-04-25: 80 ug via INTRAVENOUS
  Administered 2020-04-25: 120 ug via INTRAVENOUS
  Administered 2020-04-25: 80 ug via INTRAVENOUS

## 2020-04-25 MED ORDER — LIDOCAINE HCL (CARDIAC) PF 100 MG/5ML IV SOSY
PREFILLED_SYRINGE | INTRAVENOUS | Status: DC | PRN
  Administered 2020-04-25: 80 mg via INTRAVENOUS

## 2020-04-25 MED ORDER — OXYCODONE HCL 5 MG PO CAPS: 10.0000 mg | ORAL_CAPSULE | ORAL | 0 refills | Status: DC | PRN

## 2020-04-25 MED ORDER — FENTANYL CITRATE (PF) 100 MCG/2ML IJ SOLN
INTRAMUSCULAR | Status: AC
  Filled 2020-04-25: qty 2

## 2020-04-25 MED ORDER — ROCURONIUM BROMIDE 10 MG/ML (PF) SYRINGE
PREFILLED_SYRINGE | INTRAVENOUS | Status: DC | PRN
  Administered 2020-04-25: 60 mg via INTRAVENOUS

## 2020-04-25 MED ORDER — DEXAMETHASONE SODIUM PHOSPHATE 10 MG/ML IJ SOLN
INTRAMUSCULAR | Status: DC | PRN
  Administered 2020-04-25: 5 mg via INTRAVENOUS

## 2020-04-25 MED ORDER — FENTANYL CITRATE (PF) 100 MCG/2ML IJ SOLN
INTRAMUSCULAR | Status: DC | PRN
  Administered 2020-04-25: 75 ug via INTRAVENOUS
  Administered 2020-04-25: 25 ug via INTRAVENOUS

## 2020-04-25 MED ORDER — CIPROFLOXACIN IN D5W 400 MG/200ML IV SOLN
INTRAVENOUS | Status: AC
  Filled 2020-04-25: qty 200

## 2020-04-25 MED ORDER — PROPOFOL 10 MG/ML IV BOLUS
INTRAVENOUS | Status: DC | PRN
  Administered 2020-04-25: 150 mg via INTRAVENOUS

## 2020-04-25 MED ORDER — INDOMETHACIN 50 MG RE SUPP
RECTAL | Status: DC | PRN
  Administered 2020-04-25: 100 mg via RECTAL

## 2020-04-25 MED ORDER — INDOMETHACIN 50 MG RE SUPP
RECTAL | Status: AC
  Filled 2020-04-25: qty 1

## 2020-04-25 MED ORDER — ONDANSETRON HCL 4 MG PO TABS
4.0000 mg | ORAL_TABLET | Freq: Three times a day (TID) | ORAL | 1 refills | Status: DC | PRN
Start: 2020-04-25 — End: 2020-06-15

## 2020-04-25 MED ORDER — SODIUM CHLORIDE 0.9 % IV SOLN
INTRAVENOUS | Status: DC | PRN
  Administered 2020-04-25: 30 mL

## 2020-04-25 MED ORDER — MIDAZOLAM HCL (PF) 5 MG/ML IJ SOLN
INTRAMUSCULAR | Status: AC
  Filled 2020-04-25: qty 1

## 2020-04-25 MED ORDER — GLUCAGON HCL RDNA (DIAGNOSTIC) 1 MG IJ SOLR
INTRAMUSCULAR | Status: DC | PRN
  Administered 2020-04-25: .25 mg via INTRAVENOUS

## 2020-04-25 NOTE — Transfer of Care (Signed)
Immediate Anesthesia Transfer of Care Note  Patient: Adam Fowler  Procedure(s) Performed: ENDOSCOPIC RETROGRADE CHOLANGIOPANCREATOGRAPHY (ERCP) WITH PROPOFOL (N/A ) STENT REMOVAL BILIARY STENT PLACEMENT  Patient Location: Endoscopy Unit  Anesthesia Type:General  Level of Consciousness: awake, drowsy and patient cooperative  Airway & Oxygen Therapy: Patient Spontanous Breathing and Patient connected to face mask oxygen  Post-op Assessment: Report given to RN and Post -op Vital signs reviewed and stable  Post vital signs: Reviewed and stable  Last Vitals:  Vitals Value Taken Time  BP 163/86   Temp    Pulse 73 04/25/20 1045  Resp 17 04/25/20 1045  SpO2 99 % 04/25/20 1045  Vitals shown include unvalidated device data.  Last Pain:  Vitals:   04/25/20 0818  TempSrc: Oral  PainSc: 0-No pain         Complications: No complications documented.

## 2020-04-25 NOTE — Discharge Instructions (Signed)
YOU HAD AN ENDOSCOPIC PROCEDURE TODAY: Refer to the procedure report and other information in the discharge instructions given to you for any specific questions about what was found during the examination. If this information does not answer your questions, please call [place] office at [phone] to clarify.  ? ?YOU SHOULD EXPECT: Some feelings of bloating in the abdomen. Passage of more gas than usual. Walking can help get rid of the air that was put into your GI tract during the procedure and reduce the bloating. If you had a lower endoscopy (such as a colonoscopy or flexible sigmoidoscopy) you may notice spotting of blood in your stool or on the toilet paper. Some abdominal soreness may be present for a day or two, also. ? ?DIET: Your first meal following the procedure should be a light meal and then it is ok to progress to your normal diet. A half-sandwich or bowl of soup is an example of a good first meal. Heavy or fried foods are harder to digest and may make you feel nauseous or bloated. Drink plenty of fluids but you should avoid alcoholic beverages for 24 hours. If you had a esophageal dilation, please see attached instructions for diet.   ? ?ACTIVITY: Your care partner should take you home directly after the procedure. You should plan to take it easy, moving slowly for the rest of the day. You can resume normal activity the day after the procedure however YOU SHOULD NOT DRIVE, use power tools, machinery or perform tasks that involve climbing or major physical exertion for 24 hours (because of the sedation medicines used during the test).  ? ?SYMPTOMS TO REPORT IMMEDIATELY: ? ?Following upper endoscopy (EGD, EUS, ERCP, esophageal dilation) ?Vomiting of blood or coffee ground material  ?New, significant abdominal pain  ?New, significant chest pain or pain under the shoulder blades  ?Painful or persistently difficult swallowing  ?New shortness of breath  ?Black, tarry-looking or red, bloody  stools ? ?FOLLOW UP:  ?If any biopsies were taken you will be contacted by phone or by letter within the next 1-3 weeks. Call [phone]  if you have not heard about the biopsies in 3 weeks.  ?Please also call with any specific questions about appointments or follow up tests.  ?

## 2020-04-25 NOTE — ED Triage Notes (Signed)
Pt states he had a stent placed in his pancreas today, reports he has had increased pain, nausea and vomiting since.

## 2020-04-25 NOTE — Telephone Encounter (Signed)
Left message on machine to call back  Dr Rush Landmark FYI the pt is calling asking for pain medication after ERCP today.  I was not able to reach him by phone.  He may be calling on call later tonight.

## 2020-04-25 NOTE — Op Note (Signed)
[facility] Patient Name: Adam Fowler Procedure Date : 04/25/2020 MRN: [id number] Attending MD: Justice Britain , MD Date of Birth: [birth year] CSN: [id number] Age: 64 Admit Type: Outpatient Procedure:                ERCP Indications:              Bile leak, Stent change, Postop exam:                            Cholecystectomy Providers:                Justice Britain, MD, Carlyn Reichert, RN, Tyrone Apple, Technician, Hedy Camara Referring MD:             Jesusita Oka, Linus Mako Ramond Marrow B. Bernhard Medicines:                General Anesthesia, Cipro 400 mg IV, Indomethacin                            100 mg PR, Glucagon 0.5 mg IV Complications:            No immediate complications. Estimated Blood Loss:     Estimated blood loss was minimal. Procedure:                Pre-Anesthesia Assessment:                           - Prior to the procedure, a History and Physical                            was performed, and patient medications and                            allergies were reviewed. The patient's tolerance of                            previous anesthesia was also reviewed. The risks                            and benefits of the procedure and the sedation                            options and risks were discussed with the patient.                            All questions were answered, and informed consent                            was obtained. Prior Anticoagulants: The patient has                            taken no previous anticoagulant or antiplatelet  agents. ASA Grade Assessment: III - A patient with                            severe systemic disease. After reviewing the risks                            and benefits, the patient was deemed in                            satisfactory condition to undergo the procedure.                           After obtaining informed consent, the scope was                             passed under direct vision. Throughout the                            procedure, the patient's blood pressure, pulse, and                            oxygen saturations were monitored continuously. The                            TJF-Q180V ([id number]) Olympus Duodensocope was                            introduced through the mouth, and used to inject                            contrast into and used to inject contrast into the                            bile duct. The ERCP was accomplished without                            difficulty. The patient tolerated the procedure. Scope In: Scope Out: Findings:      A scout film of the abdomen was obtained. One percutaneous drain ending       in the Right upper quadrant was seen. One stent ending in the main bile       duct was seen.      The esophagus was successfully intubated under direct vision without       detailed examination of the pharynx, larynx, and associated structures,       and upper GI tract. A biliary sphincterotomy had been performed. The       sphincterotomy appeared open. One plastic biliary stent originating in       the biliary tree was emerging from the major papilla. The stent was       visibly patent. One stent was removed from the biliary tree using a       Raptor grasping device.      A short 0.035 inch Soft Jagwire was passed into the biliary tree. The       Autotome sphincterotome was passed over  the guidewire and the bile duct       was then deeply cannulated. Contrast was injected. I personally       interpreted the bile duct images. Ductal flow of contrast was adequate.       Image quality was adequate. Contrast extended to the hepatic ducts.       Opacification of the entire biliary tree except for the gallbladder was       successful. Extravasation of contrast originating from the cystic duct       was observed consistent with known cystic duct leak. The main bile duct       was normal in  size approximately 6 mm. To discover objects, the biliary       tree was swept with a retrieval balloon starting at the bifurcation.       Nothing was found other than the biliary leak that appeared to be just       at 5.5 cm from the ampulla. One Boston Wallflex 8 mm by 6 cm covered       metal biliary stent was placed into the common bile duct. Bile flowed       through the stent. The stent was in good position. An occlusion       cholangiogram was performed through the stent and that showed no further       significant biliary pathology or persisting leak.      A pancreatogram was not performed.      The duodenoscope was withdrawn from the patient. Impression:               - Prior biliary sphincterotomy appeared open.                           - One visibly patent stent from the biliary tree                            was seen in the major papilla. This was removed.                           - The biliary tree was swept and nothing was found.                           - A bile leak was found at the cystic duct region. .                           - One covered metal biliary stent was placed into                            the common bile duct to traverse the cystic duct                            stump in effort of closing/healing leak. Recommendation:           - The patient will be observed post-procedure,                            until all discharge criteria are met.                           -  Discharge patient to home.                           - Patient has a contact number available for                            emergencies. The signs and symptoms of potential                            delayed complications were discussed with the                            patient. Return to normal activities tomorrow.                            Written discharge instructions were provided to the                            patient.                           - Low fat diet.                            - Check liver enzymes (AST, ALT, alkaline                            phosphatase, bilirubin) in 2 weeks.                           - Observe patient's clinical course.                           - Watch for pancreatitis, bleeding, perforation,                            and cholangitis.                           - Return to this GI lab for stent removal at ERCP                            in 6-8 weeks.                           - If patient still has drainage output at 2-weeks                            post ERCP or IR drain still shows leak, then will                            need to special order an 8 mm x 8 cm FCSEMS or 10                            mm x 8 cm FCSEMS.                           -  The findings and recommendations were discussed                            with the patient.                           - The findings and recommendations were discussed                            with the patient's family. Procedure Code(s):        --- Professional ---                           [phone], Endoscopic retrograde                            cholangiopancreatography (ERCP); with removal and                            exchange of stent(s), biliary or pancreatic duct,                            including pre- and post-dilation and guide wire                            passage, when performed, including sphincterotomy,                            when performed, each stent exchanged Diagnosis Code(s):        --- Professional ---                           I09.73, Presence of other specified functional                            implants                           K83.9, Disease of biliary tract, unspecified                           Z46.59, Encounter for fitting and adjustment of                            other gastrointestinal appliance and device                           Z09, Encounter for follow-up examination after                            completed treatment for conditions  other than                            malignant neoplasm                           Z90.49, Acquired absence of other  specified parts                            of digestive tract                           K83.8, Other specified diseases of biliary tract CPT copyright 2019 American Medical Association. All rights reserved. The codes documented in this report are preliminary and upon coder review may  be revised to meet current compliance requirements. Justice Britain, MD 04/25/2020 10:44:00 AM Number of Addenda: 0

## 2020-04-25 NOTE — Anesthesia Procedure Notes (Signed)
Procedure Name: Intubation Date/Time: 04/25/2020 9:46 AM Performed by: Raenette Rover, CRNA Pre-anesthesia Checklist: Patient identified, Emergency Drugs available, Suction available and Patient being monitored Patient Re-evaluated:Patient Re-evaluated prior to induction Oxygen Delivery Method: Circle system utilized Preoxygenation: Pre-oxygenation with 100% oxygen Induction Type: IV induction Ventilation: Mask ventilation without difficulty Laryngoscope Size: Mac and 4 Grade View: Grade I Tube type: Oral Tube size: 7.5 mm Number of attempts: 1 Airway Equipment and Method: Stylet Placement Confirmation: ETT inserted through vocal cords under direct vision,  positive ETCO2 and breath sounds checked- equal and bilateral Secured at: 22 cm Tube secured with: Tape Dental Injury: Teeth and Oropharynx as per pre-operative assessment

## 2020-04-25 NOTE — Telephone Encounter (Signed)
I have entered labs and will call the pt in the AM for an update

## 2020-04-25 NOTE — Anesthesia Preprocedure Evaluation (Addendum)
Anesthesia Evaluation  Patient identified by MRN, date of birth, ID band Patient awake    Reviewed: Allergy & Precautions, NPO status , Patient's Chart, lab work & pertinent test results  Airway Mallampati: II  TM Distance: >3 FB Neck ROM: Full    Dental  (+) Teeth Intact   Pulmonary neg pulmonary ROS,    Pulmonary exam normal breath sounds clear to auscultation       Cardiovascular hypertension, Pt. on medications Normal cardiovascular exam Rhythm:Regular Rate:Normal     Neuro/Psych negative neurological ROS  negative psych ROS   GI/Hepatic negative GI ROS, PD stent, bile leak stent, biliary pancreatitis   Endo/Other  negative endocrine ROS  Renal/GU negative Renal ROS     Musculoskeletal negative musculoskeletal ROS (+)   Abdominal   Peds  Hematology negative hematology ROS (+)   Anesthesia Other Findings Day of surgery medications reviewed with the patient.  Reproductive/Obstetrics                            Anesthesia Physical Anesthesia Plan  ASA: II  Anesthesia Plan: General   Post-op Pain Management:    Induction: Intravenous  PONV Risk Score and Plan: 3 and Midazolam, Dexamethasone and Ondansetron  Airway Management Planned: Oral ETT  Additional Equipment:   Intra-op Plan:   Post-operative Plan: Extubation in OR  Informed Consent: I have reviewed the patients History and Physical, chart, labs and discussed the procedure including the risks, benefits and alternatives for the proposed anesthesia with the patient or authorized representative who has indicated his/her understanding and acceptance.       Plan Discussed with: CRNA  Anesthesia Plan Comments:        Anesthesia Quick Evaluation

## 2020-04-25 NOTE — H&P (Signed)
GASTROENTEROLOGY PROCEDURE H&P NOTE   Primary Care Physician: Melony Overly, MD  HPI: Adam Fowler is a 64 y.o. male who presents for ERCP for attempt at further biliary leak management and FCSEMS placement.  Past Medical History:  Diagnosis Date  . History of kidney stones    passed  . Hypertension   . Insomnia    Past Surgical History:  Procedure Laterality Date  . ARTHROSCOPIC REPAIR ACL Bilateral    left, 2012; right 2004  . BILIARY STENT PLACEMENT  01/23/2020   Procedure: BILIARY STENT PLACEMENT;  Surgeon: Irving Copas., MD;  Location: [place];  Service: Gastroenterology;;  . BILIARY STENT PLACEMENT  03/21/2020   Procedure: BILIARY STENT PLACEMENT;  Surgeon: Irving Copas., MD;  Location: [place];  Service: Gastroenterology;;  . CHOLECYSTECTOMY N/A 01/24/2020   Procedure: LAPAROSCOPIC CHOLECYSTECTOMY;  Surgeon: Jesusita Oka, MD;  Location: [place];  Service: General;  Laterality: N/A;  . COLONOSCOPY    . ENDOSCOPIC RETROGRADE CHOLANGIOPANCREATOGRAPHY (ERCP) WITH PROPOFOL N/A 03/21/2020   Procedure: ENDOSCOPIC RETROGRADE CHOLANGIOPANCREATOGRAPHY (ERCP) WITH PROPOFOL;  Surgeon: Rush Landmark Telford Nab., MD;  Location: [place];  Service: Gastroenterology;  Laterality: N/A;  . ERCP N/A 01/21/2020   Procedure: ENDOSCOPIC RETROGRADE CHOLANGIOPANCREATOGRAPHY (ERCP);  Surgeon: Ladene Artist, MD;  Location: [facility] ENDOSCOPY;  Service: Endoscopy;  Laterality: N/A;  . ERCP N/A 01/23/2020   Procedure: ENDOSCOPIC RETROGRADE CHOLANGIOPANCREATOGRAPHY (ERCP);  Surgeon: Irving Copas., MD;  Location: [place];  Service: Gastroenterology;  Laterality: N/A;  . IR RADIOLOGIST EVAL & MGMT  03/01/2020  . IR RADIOLOGIST EVAL & MGMT  04/10/2020  . PANCREATIC STENT PLACEMENT  01/21/2020   Procedure: PANCREATIC STENT PLACEMENT;  Surgeon: Ladene Artist, MD;  Location: [facility] ENDOSCOPY;  Service: Endoscopy;;  . PANCREATIC STENT PLACEMENT  01/23/2020    Procedure: PANCREATIC STENT PLACEMENT;  Surgeon: Irving Copas., MD;  Location: [place];  Service: Gastroenterology;;  . [name]  01/23/2020   Procedure: [name];  Surgeon: Irving Copas., MD;  Location: [place];  Service: Gastroenterology;;  . Lavell Islam REMOVAL  03/21/2020   Procedure: STENT REMOVAL;  Surgeon: Irving Copas., MD;  Location: [place];  Service: Gastroenterology;;   Current Facility-Administered Medications  Medication Dose Route Frequency Provider Last Rate Last Admin  . lactated ringers infusion   Intravenous Continuous Mansouraty, Telford Nab., MD 10 mL/hr at 04/25/20 0823 New Bag at 04/25/20 [phone]   No Known Allergies Family History  Problem Relation Age of Onset  . Colon cancer Neg Hx   . Stomach cancer Neg Hx    Social History   Socioeconomic History  . Marital status: Married    Spouse name: Not on file  . Number of children: Not on file  . Years of education: Not on file  . Highest education level: Not on file  Occupational History  . Not on file  Tobacco Use  . Smoking status: Never Smoker  . Smokeless tobacco: Never Used  Vaping Use  . Vaping Use: Never used  Substance and Sexual Activity  . Alcohol use: Not Currently    Comment: occasional  . Drug use: No  . Sexual activity: Not Currently    Partners: Female  Other Topics Concern  . Not on file  Social History Narrative  . Not on file   Social Determinants of Health   Financial Resource Strain:   . Difficulty of Paying Living Expenses:   Food Insecurity:   . Worried About Charity fundraiser in  the Last Year:   . [place] in the Last Year:   Transportation Needs:   . Film/video editor (Medical):   Marland Kitchen Lack of Transportation (Non-Medical):   Physical Activity:   . Days of Exercise per Week:   . Minutes of Exercise per Session:   Stress:   . Feeling of Stress :   Social Connections:   . Frequency of Communication with Friends  and Family:   . Frequency of Social Gatherings with Friends and Family:   . Attends Religious Services:   . Active Member of Clubs or Organizations:   . Attends Archivist Meetings:   Marland Kitchen Marital Status:   Intimate Partner Violence:   . Fear of Current or Ex-Partner:   . Emotionally Abused:   Marland Kitchen Physically Abused:   . Sexually Abused:     Physical Exam: Vital signs in last 24 hours: Temp:  [98.2 F (36.8 C)] 98.2 F (36.8 C) (08/18 0818) Pulse Rate:  [65] 65 (08/18 0818) Resp:  [13] 13 (08/18 0818) BP: (133)/(86) 133/86 (08/18 0818) SpO2:  [97 %] 97 % (08/18 0818) Weight:  [81.6 kg] 81.6 kg (08/18 0818)   GEN: NAD EYE: Sclerae anicteric ENT: MMM CV: Non-tachycardic GI: Soft, JP drain in place NEURO:  Alert & Oriented x 3  Lab Results: No results for input(s): WBC, HGB, HCT, PLT in the last 72 hours. BMET No results for input(s): NA, K, CL, CO2, GLUCOSE, BUN, CREATININE, CALCIUM in the last 72 hours. LFT No results for input(s): PROT, ALBUMIN, AST, ALT, ALKPHOS, BILITOT, BILIDIR, IBILI in the last 72 hours. PT/INR No results for input(s): LABPROT, INR in the last 72 hours.   Impression / Plan: This is a 64 y.o.male who presents for ERCP for attempt at further biliary leak management and FCSEMS placement.  The risks of an ERCP were discussed at length, including but not limited to the risk of perforation, bleeding, abdominal pain, post-ERCP pancreatitis (while usually mild can be severe and even life threatening).  The risks and benefits of endoscopic evaluation were discussed with the patient; these include but are not limited to the risk of perforation, infection, bleeding, missed lesions, lack of diagnosis, severe illness requiring hospitalization, as well as anesthesia and sedation related illnesses.  The patient is agreeable to proceed.    Justice Britain, MD [facility] Advanced [facility]oscopy Office # [phone]

## 2020-04-25 NOTE — Anesthesia Postprocedure Evaluation (Signed)
Anesthesia Post Note  Patient: Adam Fowler  Procedure(s) Performed: ENDOSCOPIC RETROGRADE CHOLANGIOPANCREATOGRAPHY (ERCP) WITH PROPOFOL (N/A ) [place] PLACEMENT     Patient location during evaluation: PACU Anesthesia Type: General Level of consciousness: awake and alert, awake and oriented Pain management: pain level controlled Vital Signs Assessment: post-procedure vital signs reviewed and stable Respiratory status: spontaneous breathing, nonlabored ventilation and respiratory function stable Cardiovascular status: blood pressure returned to baseline and stable Postop Assessment: no apparent nausea or vomiting Anesthetic complications: no   No complications documented.  Last Vitals:  Vitals:   04/25/20 1052 04/25/20 1100  BP: (!) 154/92 (!) 140/96  Pulse: 75 72  Resp: 15 14  Temp:    SpO2: 93% 93%    Last Pain:  Vitals:   04/25/20 1100  TempSrc:   PainSc: 0-No pain                 Catalina Gravel

## 2020-04-25 NOTE — Telephone Encounter (Signed)
I called and spoke with the patient and patient's wife. Since getting home he has had some progressive abdominal discomfort with associated nausea and vomiting. I suspect this is a result of the size differential of the patient's biliary tree to the fully covered self-expanding metal stent. The duct differential was about 2 mm in size certainly I suspect that the patient may be having some discomfort from that. Pancreatitis feels less likely and he had a previous sphincterotomy so pushing down on the pancreatic duct with the fully covered stent seems less likely but is not impossible. We will try to see if we can get the pain under control as well as nausea over the course of this afternoon and evening.  We will reach out to him tomorrow.  We will have him come in for labs as well to see how he is doing. Hopefully will continue to well. If he is not doing well or he has any evidence of pancreatitis on labs then certainly he will need to come into the hospital or if he has progressive changes overnight. Patient and patient's wife were appreciative for the call back and understand the risks and know what to look out for if he is continuing to do poorly.  Oxycodone 5 to 10 mg every 4 hours as needed (30 tablets total)  Motrin 600 mg staggered with Tylenol 650 mg in between oxycodone Aggressive fluids intake Low-fat diet  Patty please move forward with ordering labs including a CBC/CMP/lipase/amylase to be drawn tomorrow morning after you talk with the patient and check and see how they are doing.  Thanks. GM

## 2020-04-25 NOTE — Telephone Encounter (Signed)
Patient and wife called this evening with complaints of new onset epigastric pain radiating to the back with intractable nausea with vomiting. This after ERCP with biliary stent exchange (plastic for covered SEMS) for persistent postoperative bile leak, earlier today. Unable to keep anything down, including pain meds and antiemetics called in for him this afternoon. I told them that I suspect post ERCP pancreatitis and recommended that they proceed to the ER (they prefer [facility]) for assessment and likely admission, with treatment. GI hospital team and Dr. Rush Landmark notified.

## 2020-04-26 ENCOUNTER — Emergency Department (HOSPITAL_COMMUNITY): Payer: 59

## 2020-04-26 ENCOUNTER — Telehealth: Payer: Self-pay | Admitting: Gastroenterology

## 2020-04-26 ENCOUNTER — Inpatient Hospital Stay (HOSPITAL_COMMUNITY)
Admission: EM | Admit: 2020-04-26 | Discharge: 2020-06-15 | DRG: 870 | Disposition: A | Discharge status: Hospice | Payer: 59 | Attending: Internal Medicine | Admitting: Internal Medicine

## 2020-04-26 DIAGNOSIS — M549 Dorsalgia, unspecified: Secondary | ICD-10-CM

## 2020-04-26 DIAGNOSIS — J90 Pleural effusion, not elsewhere classified: Secondary | ICD-10-CM | POA: Diagnosis present

## 2020-04-26 DIAGNOSIS — Z7982 Long term (current) use of aspirin: Secondary | ICD-10-CM

## 2020-04-26 DIAGNOSIS — Z7189 Other specified counseling: Secondary | ICD-10-CM

## 2020-04-26 DIAGNOSIS — R0902 Hypoxemia: Secondary | ICD-10-CM

## 2020-04-26 DIAGNOSIS — G4700 Insomnia, unspecified: Secondary | ICD-10-CM | POA: Diagnosis present

## 2020-04-26 DIAGNOSIS — G894 Chronic pain syndrome: Secondary | ICD-10-CM | POA: Diagnosis present

## 2020-04-26 DIAGNOSIS — K831 Obstruction of bile duct: Secondary | ICD-10-CM | POA: Diagnosis present

## 2020-04-26 DIAGNOSIS — I119 Hypertensive heart disease without heart failure: Secondary | ICD-10-CM | POA: Diagnosis present

## 2020-04-26 DIAGNOSIS — D6489 Other specified anemias: Secondary | ICD-10-CM | POA: Diagnosis present

## 2020-04-26 DIAGNOSIS — D72829 Elevated white blood cell count, unspecified: Secondary | ICD-10-CM | POA: Diagnosis not present

## 2020-04-26 DIAGNOSIS — K567 Ileus, unspecified: Secondary | ICD-10-CM

## 2020-04-26 DIAGNOSIS — E869 Volume depletion, unspecified: Secondary | ICD-10-CM | POA: Diagnosis not present

## 2020-04-26 DIAGNOSIS — D638 Anemia in other chronic diseases classified elsewhere: Secondary | ICD-10-CM | POA: Diagnosis present

## 2020-04-26 DIAGNOSIS — D62 Acute posthemorrhagic anemia: Secondary | ICD-10-CM | POA: Diagnosis not present

## 2020-04-26 DIAGNOSIS — R140 Abdominal distension (gaseous): Secondary | ICD-10-CM

## 2020-04-26 DIAGNOSIS — R7989 Other specified abnormal findings of blood chemistry: Secondary | ICD-10-CM | POA: Diagnosis present

## 2020-04-26 DIAGNOSIS — B961 Klebsiella pneumoniae [K. pneumoniae] as the cause of diseases classified elsewhere: Secondary | ICD-10-CM | POA: Diagnosis present

## 2020-04-26 DIAGNOSIS — K5730 Diverticulosis of large intestine without perforation or abscess without bleeding: Secondary | ICD-10-CM | POA: Diagnosis present

## 2020-04-26 DIAGNOSIS — R54 Age-related physical debility: Secondary | ICD-10-CM | POA: Diagnosis present

## 2020-04-26 DIAGNOSIS — J918 Pleural effusion in other conditions classified elsewhere: Secondary | ICD-10-CM | POA: Diagnosis present

## 2020-04-26 DIAGNOSIS — J9601 Acute respiratory failure with hypoxia: Secondary | ICD-10-CM | POA: Diagnosis present

## 2020-04-26 DIAGNOSIS — Z515 Encounter for palliative care: Secondary | ICD-10-CM

## 2020-04-26 DIAGNOSIS — E871 Hypo-osmolality and hyponatremia: Secondary | ICD-10-CM | POA: Diagnosis present

## 2020-04-26 DIAGNOSIS — E7800 Pure hypercholesterolemia, unspecified: Secondary | ICD-10-CM | POA: Diagnosis present

## 2020-04-26 DIAGNOSIS — Z4659 Encounter for fitting and adjustment of other gastrointestinal appliance and device: Secondary | ICD-10-CM

## 2020-04-26 DIAGNOSIS — J9811 Atelectasis: Secondary | ICD-10-CM | POA: Diagnosis present

## 2020-04-26 DIAGNOSIS — K838 Other specified diseases of biliary tract: Secondary | ICD-10-CM

## 2020-04-26 DIAGNOSIS — R0600 Dyspnea, unspecified: Secondary | ICD-10-CM

## 2020-04-26 DIAGNOSIS — Y838 Other surgical procedures as the cause of abnormal reaction of the patient, or of later complication, without mention of misadventure at the time of the procedure: Secondary | ICD-10-CM | POA: Diagnosis present

## 2020-04-26 DIAGNOSIS — E860 Dehydration: Secondary | ICD-10-CM | POA: Diagnosis present

## 2020-04-26 DIAGNOSIS — K839 Disease of biliary tract, unspecified: Secondary | ICD-10-CM

## 2020-04-26 DIAGNOSIS — K2100 Gastro-esophageal reflux disease with esophagitis, without bleeding: Secondary | ICD-10-CM | POA: Diagnosis present

## 2020-04-26 DIAGNOSIS — Z6828 Body mass index (BMI) 28.0-28.9, adult: Secondary | ICD-10-CM

## 2020-04-26 DIAGNOSIS — Z0189 Encounter for other specified special examinations: Secondary | ICD-10-CM

## 2020-04-26 DIAGNOSIS — R000 Tachycardia, unspecified: Secondary | ICD-10-CM | POA: Diagnosis present

## 2020-04-26 DIAGNOSIS — R109 Unspecified abdominal pain: Secondary | ICD-10-CM | POA: Diagnosis present

## 2020-04-26 DIAGNOSIS — T8143XA Infection following a procedure, organ and space surgical site, initial encounter: Secondary | ICD-10-CM

## 2020-04-26 DIAGNOSIS — K311 Adult hypertrophic pyloric stenosis: Secondary | ICD-10-CM

## 2020-04-26 DIAGNOSIS — K8581 Other acute pancreatitis with uninfected necrosis: Secondary | ICD-10-CM | POA: Diagnosis present

## 2020-04-26 DIAGNOSIS — E872 Acidosis, unspecified: Secondary | ICD-10-CM

## 2020-04-26 DIAGNOSIS — K8511 Biliary acute pancreatitis with uninfected necrosis: Secondary | ICD-10-CM | POA: Diagnosis present

## 2020-04-26 DIAGNOSIS — Z794 Long term (current) use of insulin: Secondary | ICD-10-CM

## 2020-04-26 DIAGNOSIS — E876 Hypokalemia: Secondary | ICD-10-CM | POA: Diagnosis not present

## 2020-04-26 DIAGNOSIS — E43 Unspecified severe protein-calorie malnutrition: Secondary | ICD-10-CM | POA: Diagnosis present

## 2020-04-26 DIAGNOSIS — K810 Acute cholecystitis: Secondary | ICD-10-CM

## 2020-04-26 DIAGNOSIS — R6521 Severe sepsis with septic shock: Secondary | ICD-10-CM | POA: Diagnosis present

## 2020-04-26 DIAGNOSIS — F32A Depression, unspecified: Secondary | ICD-10-CM | POA: Diagnosis present

## 2020-04-26 DIAGNOSIS — R0602 Shortness of breath: Secondary | ICD-10-CM

## 2020-04-26 DIAGNOSIS — E785 Hyperlipidemia, unspecified: Secondary | ICD-10-CM | POA: Diagnosis present

## 2020-04-26 DIAGNOSIS — Y848 Other medical procedures as the cause of abnormal reaction of the patient, or of later complication, without mention of misadventure at the time of the procedure: Secondary | ICD-10-CM | POA: Diagnosis present

## 2020-04-26 DIAGNOSIS — K9189 Other postprocedural complications and disorders of digestive system: Secondary | ICD-10-CM | POA: Diagnosis present

## 2020-04-26 DIAGNOSIS — G9341 Metabolic encephalopathy: Secondary | ICD-10-CM | POA: Diagnosis not present

## 2020-04-26 DIAGNOSIS — Z9049 Acquired absence of other specified parts of digestive tract: Secondary | ICD-10-CM

## 2020-04-26 DIAGNOSIS — T85520A Displacement of bile duct prosthesis, initial encounter: Secondary | ICD-10-CM

## 2020-04-26 DIAGNOSIS — K2289 Other specified disease of esophagus: Secondary | ICD-10-CM | POA: Diagnosis not present

## 2020-04-26 DIAGNOSIS — K2971 Gastritis, unspecified, with bleeding: Secondary | ICD-10-CM | POA: Diagnosis present

## 2020-04-26 DIAGNOSIS — T8189XA Other complications of procedures, not elsewhere classified, initial encounter: Secondary | ICD-10-CM

## 2020-04-26 DIAGNOSIS — A4159 Other Gram-negative sepsis: Principal | ICD-10-CM | POA: Diagnosis present

## 2020-04-26 DIAGNOSIS — R159 Full incontinence of feces: Secondary | ICD-10-CM | POA: Diagnosis not present

## 2020-04-26 DIAGNOSIS — R079 Chest pain, unspecified: Secondary | ICD-10-CM

## 2020-04-26 DIAGNOSIS — T40605A Adverse effect of unspecified narcotics, initial encounter: Secondary | ICD-10-CM | POA: Diagnosis not present

## 2020-04-26 DIAGNOSIS — E875 Hyperkalemia: Secondary | ICD-10-CM | POA: Diagnosis not present

## 2020-04-26 DIAGNOSIS — J690 Pneumonitis due to inhalation of food and vomit: Secondary | ICD-10-CM | POA: Diagnosis not present

## 2020-04-26 DIAGNOSIS — D751 Secondary polycythemia: Secondary | ICD-10-CM | POA: Diagnosis present

## 2020-04-26 DIAGNOSIS — G9340 Encephalopathy, unspecified: Secondary | ICD-10-CM

## 2020-04-26 DIAGNOSIS — K651 Peritoneal abscess: Secondary | ICD-10-CM | POA: Diagnosis not present

## 2020-04-26 DIAGNOSIS — Z87442 Personal history of urinary calculi: Secondary | ICD-10-CM

## 2020-04-26 DIAGNOSIS — E1165 Type 2 diabetes mellitus with hyperglycemia: Secondary | ICD-10-CM | POA: Diagnosis present

## 2020-04-26 DIAGNOSIS — K760 Fatty (change of) liver, not elsewhere classified: Secondary | ICD-10-CM | POA: Diagnosis present

## 2020-04-26 DIAGNOSIS — Z789 Other specified health status: Secondary | ICD-10-CM

## 2020-04-26 DIAGNOSIS — K2210 Ulcer of esophagus without bleeding: Secondary | ICD-10-CM | POA: Diagnosis present

## 2020-04-26 DIAGNOSIS — K2980 Duodenitis without bleeding: Secondary | ICD-10-CM | POA: Diagnosis present

## 2020-04-26 DIAGNOSIS — F419 Anxiety disorder, unspecified: Secondary | ICD-10-CM | POA: Diagnosis present

## 2020-04-26 DIAGNOSIS — Z66 Do not resuscitate: Secondary | ICD-10-CM | POA: Diagnosis not present

## 2020-04-26 DIAGNOSIS — E778 Other disorders of glycoprotein metabolism: Secondary | ICD-10-CM | POA: Diagnosis not present

## 2020-04-26 DIAGNOSIS — Z79899 Other long term (current) drug therapy: Secondary | ICD-10-CM

## 2020-04-26 DIAGNOSIS — A419 Sepsis, unspecified organism: Secondary | ICD-10-CM

## 2020-04-26 DIAGNOSIS — K55069 Acute infarction of intestine, part and extent unspecified: Secondary | ICD-10-CM | POA: Diagnosis present

## 2020-04-26 DIAGNOSIS — R040 Epistaxis: Secondary | ICD-10-CM | POA: Diagnosis not present

## 2020-04-26 DIAGNOSIS — K5903 Drug induced constipation: Secondary | ICD-10-CM | POA: Diagnosis not present

## 2020-04-26 DIAGNOSIS — Z781 Physical restraint status: Secondary | ICD-10-CM

## 2020-04-26 DIAGNOSIS — Z20822 Contact with and (suspected) exposure to covid-19: Secondary | ICD-10-CM | POA: Diagnosis present

## 2020-04-26 DIAGNOSIS — K859 Acute pancreatitis without necrosis or infection, unspecified: Secondary | ICD-10-CM

## 2020-04-26 DIAGNOSIS — K668 Other specified disorders of peritoneum: Secondary | ICD-10-CM

## 2020-04-26 DIAGNOSIS — I700 Atherosclerosis of aorta: Secondary | ICD-10-CM | POA: Diagnosis present

## 2020-04-26 DIAGNOSIS — J80 Acute respiratory distress syndrome: Secondary | ICD-10-CM

## 2020-04-26 DIAGNOSIS — K8309 Other cholangitis: Secondary | ICD-10-CM

## 2020-04-26 DIAGNOSIS — K858 Other acute pancreatitis without necrosis or infection: Secondary | ICD-10-CM

## 2020-04-26 DIAGNOSIS — K315 Obstruction of duodenum: Secondary | ICD-10-CM | POA: Diagnosis present

## 2020-04-26 DIAGNOSIS — N179 Acute kidney failure, unspecified: Secondary | ICD-10-CM

## 2020-04-26 DIAGNOSIS — I480 Paroxysmal atrial fibrillation: Secondary | ICD-10-CM | POA: Diagnosis present

## 2020-04-26 LAB — BASIC METABOLIC PANEL
Anion gap: 17 — ABNORMAL HIGH (ref 5–15)
BUN: 36 mg/dL — ABNORMAL HIGH (ref 8–23)
CO2: 16 mmol/L — ABNORMAL LOW (ref 22–32)
Calcium: 8 mg/dL — ABNORMAL LOW (ref 8.9–10.3)
Chloride: 103 mmol/L (ref 98–111)
Creatinine, Ser: 1.41 mg/dL — ABNORMAL HIGH (ref 0.61–1.24)
GFR calc Af Amer: >60 mL/min (ref >60)
GFR calc non Af Amer: 52 mL/min — ABNORMAL LOW (ref >60)
Glucose, Bld: 191 mg/dL — ABNORMAL HIGH (ref 70–99)
Potassium: 4.5 mmol/L (ref 3.5–5.1)
Sodium: 136 mmol/L (ref 135–145)

## 2020-04-26 LAB — LIPASE, BLOOD: Lipase: 1379 U/L — ABNORMAL HIGH (ref 11–51)

## 2020-04-26 LAB — CBC
HCT: 57.8 % — ABNORMAL HIGH (ref 39.0–52.0)
Hemoglobin: 19.3 g/dL — ABNORMAL HIGH (ref 13.0–17.0)
MCH: 28.8 pg (ref 26.0–34.0)
MCHC: 33.4 g/dL (ref 30.0–36.0)
MCV: 86.3 fL (ref 80.0–100.0)
Platelets: 260 10*3/uL (ref 150–400)
RBC: 6.7 MIL/uL — ABNORMAL HIGH (ref 4.22–5.81)
RDW: 16.8 % — ABNORMAL HIGH (ref 11.5–15.5)
WBC: 41.3 10*3/uL — ABNORMAL HIGH (ref 4.0–10.5)
nRBC: 0 % (ref 0.0–0.2)

## 2020-04-26 LAB — HEPATIC FUNCTION PANEL
ALT: 48 U/L — ABNORMAL HIGH (ref 0–44)
AST: 102 U/L — ABNORMAL HIGH (ref 15–41)
Albumin: 4.3 g/dL (ref 3.5–5.0)
Alkaline Phosphatase: 69 U/L (ref 38–126)
Bilirubin, Direct: 0.6 mg/dL — ABNORMAL HIGH (ref 0.0–0.2)
Indirect Bilirubin: 3.3 mg/dL — ABNORMAL HIGH (ref 0.3–0.9)
Total Bilirubin: 3.9 mg/dL — ABNORMAL HIGH (ref 0.3–1.2)
Total Protein: 7.7 g/dL (ref 6.5–8.1)

## 2020-04-26 LAB — SARS CORONAVIRUS 2 BY RT PCR (HOSPITAL ORDER, PERFORMED IN ~~LOC~~ HOSPITAL LAB): SARS Coronavirus 2: NEGATIVE

## 2020-04-26 LAB — LACTIC ACID, PLASMA
Lactic Acid, Venous: 8.4 mmol/L (ref 0.5–1.9)
Lactic Acid, Venous: 4.6 mmol/L (ref 0.5–1.9)

## 2020-04-26 LAB — TROPONIN I (HIGH SENSITIVITY)
Troponin I (High Sensitivity): 13 ng/L (ref <18)
Troponin I (High Sensitivity): 15 ng/L (ref <18)

## 2020-04-26 MED ORDER — PIPERACILLIN-TAZOBACTAM 3.375 G IVPB 30 MIN
3.3750 g | Freq: Once | INTRAVENOUS | Status: AC
  Administered 2020-04-26: 3.375 g via INTRAVENOUS
  Filled 2020-04-26: qty 50

## 2020-04-26 MED ORDER — SODIUM CHLORIDE 0.9 % IV BOLUS
2000.0000 mL | Freq: Once | INTRAVENOUS | Status: AC
  Administered 2020-04-26: 2000 mL via INTRAVENOUS

## 2020-04-26 MED ORDER — PIPERACILLIN-TAZOBACTAM 3.375 G IVPB
3.3750 g | Freq: Three times a day (TID) | INTRAVENOUS | Status: AC
  Administered 2020-04-26 – 2020-04-30 (×12): 3.375 g via INTRAVENOUS
  Filled 2020-04-26 (×15): qty 50

## 2020-04-26 MED ORDER — ONDANSETRON HCL 4 MG/2ML IJ SOLN
4.0000 mg | Freq: Once | INTRAMUSCULAR | Status: AC
  Administered 2020-04-26: 4 mg via INTRAVENOUS
  Filled 2020-04-26: qty 2

## 2020-04-26 MED ORDER — ONDANSETRON HCL 4 MG/2ML IJ SOLN: 4.0000 mg | Freq: Once | INTRAMUSCULAR | Status: DC

## 2020-04-26 MED ORDER — HYDROMORPHONE HCL 1 MG/ML IJ SOLN: 1.0000 mg | Freq: Once | INTRAMUSCULAR | Status: DC

## 2020-04-26 MED ORDER — SODIUM CHLORIDE 0.9 % IV BOLUS: 1000.0000 mL | Freq: Once | INTRAVENOUS | Status: DC

## 2020-04-26 MED ORDER — SODIUM CHLORIDE 0.9 % IV SOLN: INTRAVENOUS | Status: DC

## 2020-04-26 MED ORDER — HYDROMORPHONE HCL 1 MG/ML IJ SOLN
1.0000 mg | INTRAMUSCULAR | Status: DC | PRN
  Administered 2020-04-26 – 2020-04-27 (×3): 1 mg via INTRAVENOUS
  Filled 2020-04-26 (×3): qty 1

## 2020-04-26 MED ORDER — SODIUM CHLORIDE 0.9 % IV BOLUS
1000.0000 mL | Freq: Once | INTRAVENOUS | Status: AC
  Administered 2020-04-26: 1000 mL via INTRAVENOUS

## 2020-04-26 MED ORDER — ONDANSETRON HCL 4 MG/2ML IJ SOLN
4.0000 mg | Freq: Four times a day (QID) | INTRAMUSCULAR | Status: DC | PRN
  Administered 2020-04-26 – 2020-05-15 (×7): 4 mg via INTRAVENOUS
  Filled 2020-04-26 (×6): qty 2

## 2020-04-26 NOTE — Progress Notes (Signed)
Pharmacy Antibiotic Note  Adam Fowler is a 64 y.o. male admitted on 04/26/2020 with intra-abdominal infection. The patient presented with severe abdominal pain with nausea and vomiting since getting an ERCP on 04/25/20. The patient is afebrile with most recent WBC at 41.3 and lipase at 1,431. Pharmacy has been consulted for Zosyn dosing.  Plan: - Zosyn IV 3.375 g over 30 minutes x1 dose, then 6 hours later continue with Zosyn IV 3.375 g q8h over 4 hours - Monitor clinical status, renal function, cultures and length of therapy - Deescalate therapy as clinically appropriate   Height: 5\' 9"  (175.3 cm) Weight: 81.6 kg (180 lb) IBW/kg (Calculated) : 70.7  Temp (24hrs), Avg:98.3 F (36.8 C), Min:97.8 F (36.6 C), Max:98.6 F (37 C)  Recent Labs  Lab 04/25/20 2034 04/26/20 1214  WBC 21.4* 41.3*  CREATININE 1.32* 1.41*    Estimated Creatinine Clearance: 52.9 mL/min (A) (by C-G formula based on SCr of 1.41 mg/dL (H)).    No Known Allergies  Antimicrobials this admission: 8/19 Zosyn >>  Microbiology results: 8/19 BCx: sent  Thank you for allowing pharmacy to be a part of this patient's care.  Shauna Hugh, PharmD, [place]  PGY-1 Pharmacy Resident 04/26/2020 5:19 PM  Please check [email or web address] for unit-specific pharmacy phone numbers.

## 2020-04-26 NOTE — Telephone Encounter (Signed)
As far as I can see it looks like he has only been seen by the triage nurse and is still waiting.  I will keep an eye out for when he has an appropriate room to see him, etc.

## 2020-04-26 NOTE — ED Triage Notes (Signed)
Pt had stent placed in his pancreas yesterday. Reports n/v and abdominal pain since then. N/v and chest pain this morning. 324 ASA given PTA.

## 2020-04-26 NOTE — Telephone Encounter (Signed)
Actually he went to hospital.   Had lipase >1000. Had WBC >20K. Waited for 6 hours and left. Separate Telephone note from today for patient to go back to ED as he is not doing well. So I expect he should be coming your way today.  GM

## 2020-04-26 NOTE — Telephone Encounter (Signed)
Pt has not yet presented to the hospital and I see where he has called the office today to request call back GM, [place]

## 2020-04-26 NOTE — Telephone Encounter (Signed)
I spoke with the pt and he tells me that he is in severe abd pain with nausea and vomiting since ERCP yesterday.  He went to the ED last night and waited 5  hours to be seen and was told "good luck getting seen today" and left.  I spoke with Dr Rush Landmark by phone and he advised the pt go back to the ED for pancreatitis admission.  Lipase is >1400.  I advised the pt and he prefers to be direct admitted due to the extreme wait times in the ED.  Dr Rush Landmark spoke with admissions and there is a 2 day wait on beds.  The pt has been advised to call 911, however he lives in [place] and will be taken to [facility].  Per Dr Rush Landmark the pt needs to be admitted although not ideal he should call 911 and proceed with evaluation.  The pt has agreed and will call now for transport to [facility].  [facility] Dr Rush Landmark

## 2020-04-26 NOTE — Telephone Encounter (Signed)
JP, Agree with need for evaluation. Thank you for update. Very weird with his previous sphincterotomy site in place and with ease by which this procedure occurred. I have not seen a SEMS lead to pancreatitis if sphincterotomy in place in years but certainly possible. His entire duct is quite small. If he does have pancreatitis hopefully we can get him through this supportively because the FCSEMS was felt to cover the biliary leak. If not pancreatitis then suspect this is the stent expansion causing him issues. If he continues to have issues/pain, then he likely needs to be transitioned to multiple plastic stents if he keeps having issues. JMP, thank you for evaluation of him today. GM

## 2020-04-26 NOTE — Progress Notes (Signed)
[facility] Progress Note  CC:  Abdominal pain, nausea and vomiting  Subjective:  Had ERCP with stent placed yesterday for bile leak and has been vomiting and having pain ever since.    Objective:  Vital signs in last 24 hours: Temp:  [98.5 F (36.9 C)-98.6 F (37 C)] 98.5 F (36.9 C) (08/19 1208) Pulse Rate:  [87-134] 134 (08/19 1514) Resp:  [20] 20 (08/19 1514) BP: (119-142)/(97-100) 119/100 (08/19 1514) SpO2:  [94 %-97 %] 94 % (08/19 1514) Weight:  [81.6 kg] 81.6 kg (08/19 1208)   General:  Alert, well-developed, appears very uncomfortable Heart:  Tachy Pulm:  CTAB.  No increased WOB. Abdomen:  Soft, bowel sounds quiet.  Very tender diffusely but greatest in the upper abdomen.  Drain noted with some bilious/semi-bloody fluid in the bulb. Extremities:  Without edema. Neurologic:  Alert and oriented x 4;  grossly normal neurologically. Psych:  Alert and cooperative. Normal mood and affect.  Lab Results: Recent Labs    04/25/20 2034 04/26/20 1214  WBC 21.4* 41.3*  HGB 17.7* 19.3*  HCT 53.3* 57.8*  PLT 226 260   BMET Recent Labs    04/25/20 2034 04/26/20 1214  NA 139 136  K 4.8 4.5  CL 100 103  CO2 23 16*  GLUCOSE 223* 191*  BUN 24* 36*  CREATININE 1.32* 1.41*  CALCIUM 9.3 8.0*   LFT Recent Labs    04/25/20 2034  PROT 6.9  ALBUMIN 4.1  AST 51*  ALT 43  ALKPHOS 58  BILITOT 3.0*   DG Chest 2 View  Result Date: 04/26/2020 CLINICAL DATA:  64 year old male with chest pain weakness and shortness of breath. History of cholecystectomy complicated by gallbladder fossa abscess requiring percutaneous drainage. Metallic biliary stent. EXAM: CHEST - 2 VIEW COMPARISON:  Portable chest 02/13/2020 and earlier. FINDINGS: Continued low lung volumes. Normal cardiac size and mediastinal contours. Visualized tracheal air column is within normal limits. Patchy opacity at the right lung base appears similar to June. Favor atelectasis. No pneumothorax,  pulmonary edema or pleural effusion. No acute osseous abnormality identified. Right upper quadrant metal biliary stent and pigtail drainage catheter in place. Negative visible bowel gas pattern. IMPRESSION: 1. Continued low lung volumes with patchy right lung base opacity favored to be atelectasis. 2. Right upper quadrant metal biliary stent and pigtail drainage catheter in place. Electronically Signed   By: Genevie Ann M.D.   On: 04/26/2020 12:38   DG ERCP BILIARY & PANCREATIC DUCTS  Result Date: 04/26/2020 CLINICAL DATA:  64 year old male with a history of prior biliary stent placement EXAM: ERCP TECHNIQUE: Multiple spot images obtained with the fluoroscopic device and submitted for interpretation post-procedure. FLUOROSCOPY TIME:  Fluoroscopy Time:  57 seconds COMPARISON:  03/21/2020 FINDINGS: Limited images during ERCP. Initial image demonstrates the endoscope projecting over the upper abdomen with a plastic biliary stent in position. Pigtail catheter within the upper abdomen. Subsequently there is removal of a plastic biliary stent and placement of a safety wire. Partial opacification of the extrahepatic biliary system. Deployment of a balloon retrieval catheter and placement of a metallic biliary stent. IMPRESSION: Limited images during ERCP demonstrates removal plastic biliary catheter, deployment of balloon retrieval catheter, and placement of metallic biliary stent. Please refer to the dictated operative report for full details of intraoperative findings and procedure. Electronically Signed   By: Corrie Mckusick D.O.   On: 04/26/2020 09:35   Assessment / Plan: *Likely has post-ERCP pancreatitis after stent placement for bile leak  yesterday.  Leukocytosis > 40K, very hemoconcentrated with AKI, lipase last night was 1400 but still pending today and LFTs pending.  He is tachy, but afebrile.  -IV abx (zosyn) per pharmacy. -Will give bolus of NS and then 150 mL/hour after that for aggressive  hydration. -Pain control and antiemetics (I have ordered dilaudid but hospitalist can adjust/change if needed. -Needs admission to the hospitalist service and we will see him again in the morning. -May need CT scan if he worsens or fails to improve.    LOS: 0 days   Laban Emperor. Minah Axelrod  04/26/2020, 4:52 PM

## 2020-04-26 NOTE — Telephone Encounter (Signed)
Pt is requesting a call back from a nurse to discuss his stent that was placed in his pancreas

## 2020-04-26 NOTE — Telephone Encounter (Signed)
The pt is calling waiting in the [facility] ED and wanted someone to come and see him.  I advised that the ED doc will order a consult and someone will see him as soon as possible.  I will also forward to Dr Rush Landmark as an Juluis Rainier.

## 2020-04-26 NOTE — ED Notes (Signed)
Pt called for VS recheck x2, no response.

## 2020-04-26 NOTE — Telephone Encounter (Signed)
See alternate phone note dated 8/18

## 2020-04-26 NOTE — ED Provider Notes (Signed)
[facility] EMERGENCY DEPARTMENT Provider Note   CSN: [id number] Arrival date & time: 04/26/20  1150   History Chief Complaint  Patient presents with  . Abdominal Pain    Adam Fowler is a 64 y.o. male who presents with abdominal pain. Pt has complicated recent history. He had gallstone pancreatitis and cholecystitis in May which led to septic shock. He underwent a lap chole while inpatient. He developed a post-op abscess and underwent drain placement by IR in early June. Repeat CT scan late June showed communication between the gallbladder abscess cavity and cystic duct remnant. It was recommended he undergo ERCP for attempt at bile duct leak closure which was done on 7/14 but this was unsuccessful so he underwent ERCP yesterday. Afterwards he developed severe epigastric abdominal pain which radiates throughout this abdomen with associated N/V. He came last night but LWBS due to long wait times. He came back today due to worsening symptoms. He denies fever, chills, chest pain. He has some SOB because it hurts his abdomen to take deep breaths. He denies diarrhea, constipation, urinary symptoms. He is having output from his JP drain. He is having difficulty keeping anything down.  HPI   Past Medical History:  Diagnosis Date  . History of kidney stones    passed  . Hypertension   . Insomnia     Patient Active Problem List   Diagnosis Date Noted  . Postprocedural intraabdominal abscess 02/13/2020  . Hyperbilirubinemia 02/02/2020  . Pancreatitis 01/21/2020  . Bacteremia   . Elevated LFTs   . Jaundice   . Special screening for malignant neoplasms, colon 07/09/2011  . Diverticulosis of colon (without mention of hemorrhage) 07/09/2011  . 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 ARTHROSCOPIC REPAIR ACL Bilateral    left, 2012; right 2004  . BILIARY STENT PLACEMENT  01/23/2020   Procedure: BILIARY STENT PLACEMENT;  Surgeon:  Irving Copas., MD;  Location: [employer];  Service: Gastroenterology;;  . BILIARY STENT PLACEMENT  03/21/2020   Procedure: BILIARY STENT PLACEMENT;  Surgeon: Irving Copas., MD;  Location: [facility];  Service: Gastroenterology;;  . CHOLECYSTECTOMY N/A 01/24/2020   Procedure: LAPAROSCOPIC CHOLECYSTECTOMY;  Surgeon: Jesusita Oka, MD;  Location: [facility];  Service: General;  Laterality: N/A;  . COLONOSCOPY    . ENDOSCOPIC RETROGRADE CHOLANGIOPANCREATOGRAPHY (ERCP) WITH PROPOFOL N/A 03/21/2020   Procedure: ENDOSCOPIC RETROGRADE CHOLANGIOPANCREATOGRAPHY (ERCP) WITH PROPOFOL;  Surgeon: Rush Landmark Telford Nab., MD;  Location: [facility];  Service: Gastroenterology;  Laterality: N/A;  . ERCP N/A 01/21/2020   Procedure: ENDOSCOPIC RETROGRADE CHOLANGIOPANCREATOGRAPHY (ERCP);  Surgeon: Ladene Artist, MD;  Location: [facility] ENDOSCOPY;  Service: Endoscopy;  Laterality: N/A;  . ERCP N/A 01/23/2020   Procedure: ENDOSCOPIC RETROGRADE CHOLANGIOPANCREATOGRAPHY (ERCP);  Surgeon: Irving Copas., MD;  Location: [facility];  Service: Gastroenterology;  Laterality: N/A;  . IR RADIOLOGIST EVAL & MGMT  03/01/2020  . IR RADIOLOGIST EVAL & MGMT  04/10/2020  . PANCREATIC STENT PLACEMENT  01/21/2020   Procedure: PANCREATIC STENT PLACEMENT;  Surgeon: Ladene Artist, MD;  Location: [facility] ENDOSCOPY;  Service: Endoscopy;;  . PANCREATIC STENT PLACEMENT  01/23/2020   Procedure: PANCREATIC STENT PLACEMENT;  Surgeon: Irving Copas., MD;  Location: [facility] ENDOSCOPY;  Service: Gastroenterology;;  . [name]  01/23/2020   Procedure: [name];  Surgeon: Irving Copas., MD;  Location: [facility] ENDOSCOPY;  Service: Gastroenterology;;  . Lavell Islam REMOVAL  03/21/2020   Procedure: STENT REMOVAL;  Surgeon: Irving Copas.,  MD;  Location: [facility] ENDOSCOPY;  Service: Gastroenterology;;       Family History  Problem Relation Age of Onset  . Colon cancer Neg Hx   . Stomach cancer Neg Hx      Social History   Tobacco Use  . Smoking status: Never Smoker  . Smokeless tobacco: Never Used  Vaping Use  . Vaping Use: Never used  Substance Use Topics  . Alcohol use: Not Currently    Comment: occasional  . Drug use: No    Home Medications Prior to Admission medications   Medication Sig Start Date End Date Taking? Authorizing Provider  aspirin EC 81 MG tablet Take 81 mg by mouth 2 (two) times daily.    [provider]  Calcium Carb-Cholecalciferol (CALCIUM 1000 + D PO) Take 1 tablet by mouth daily.    [provider]  Coenzyme Q10 (COQ10) 400 MG CAPS Take 800 mg by mouth daily.    [provider]  Cyanocobalamin (VITAMIN B-12) 5000 MCG TBDP Take 5,000 mcg by mouth daily.    [provider]  GLUCOSAMINE-CHONDROITIN PO Take 1 tablet by mouth 2 (two) times daily.    [provider]  ibuprofen (ADVIL) 200 MG tablet Take 400-600 mg by mouth every 6 (six) hours as needed for headache or moderate pain.    [provider]  Javier Docker Oil 1000 MG CAPS Take 1,000-2,000 mg by mouth See admin instructions. Take 1000 mg in the morning and 2000 mg at bedtime    [provider]  lisinopril-hydrochlorothiazide (ZESTORETIC) 10-12.5 MG tablet Take 1 tablet by mouth daily. 03/04/20   [provider]  Moringa Oleifera (MORINGA PO) Take 1,000 mg by mouth 2 (two) times daily.    [provider]  Multiple Vitamin (MULTIVITAMIN WITH MINERALS) TABS tablet Take 1 tablet by mouth daily.    [provider]  ondansetron (ZOFRAN) 4 MG tablet Take 1 tablet (4 mg total) by mouth every 8 (eight) hours as needed for nausea or vomiting. 04/25/20   Mansouraty, Telford Nab., MD  OVER THE COUNTER MEDICATION Take 1 tablet by mouth daily. [facility] otc supplement    [provider]  oxycodone (OXY-IR) 5 MG capsule Take 2 capsules (10 mg total) by mouth every 4 (four) hours as needed for up to 3 days for pain. 04/25/20 04/28/20   Mansouraty, Telford Nab., MD  pravastatin (PRAVACHOL) 80 MG tablet Take 40 mg by mouth in the morning and at bedtime.    [provider]  Red Yeast Rice 600 MG CAPS Take 1,800 mg by mouth at bedtime.     [provider]  sertraline (ZOLOFT) 100 MG tablet Take 100 mg by mouth daily. 11/08/19   [provider]  TURMERIC PO Take 1,500 mg by mouth 2 (two) times daily.    [provider]  zolpidem (AMBIEN) 5 MG tablet Take 5 mg by mouth at bedtime. 12/29/19   [provider]    Allergies    Patient has no known allergies.  Review of Systems   Review of Systems  Constitutional: Negative for chills and fever.  Respiratory: Positive for shortness of breath.   Cardiovascular: Negative for chest pain.  Gastrointestinal: Positive for abdominal pain, nausea and vomiting. Negative for diarrhea.  Genitourinary: Negative for dysuria.  All other systems reviewed and are negative.   Physical Exam Updated Vital Signs BP (!) 142/101   Pulse (!) 103   Temp 97.8 F (36.6 C)   Resp 20  Ht 5\' 9"  (1.753 m)   Wt 81.6 kg   SpO2 94%   BMI 26.58 kg/m   Physical Exam Vitals and nursing note reviewed.  Constitutional:      General: He is in acute distress (in pain).     Appearance: He is well-developed.  HENT:     Head: Normocephalic and atraumatic.  Eyes:     General: No scleral icterus.       Right eye: No discharge.        Left eye: No discharge.     Conjunctiva/sclera: Conjunctivae normal.     Pupils: Pupils are equal, round, and reactive to light.  Cardiovascular:     Rate and Rhythm: Normal rate and regular rhythm.  Pulmonary:     Effort: Pulmonary effort is normal. No respiratory distress.     Breath sounds: Normal breath sounds.  Abdominal:     General: Abdomen is flat. There is no distension.     Tenderness: There is abdominal tenderness (diffuse). There is guarding.  Musculoskeletal:     Cervical back: Normal range of motion.  Skin:     General: Skin is warm and dry.  Neurological:     Mental Status: He is alert and oriented to person, place, and time.  Psychiatric:        Behavior: Behavior normal.     ED Results / Procedures / Treatments   Labs (all labs ordered are listed, but only abnormal results are displayed) Labs Reviewed  BASIC METABOLIC PANEL - Abnormal; Notable for the following components:      Result Value   CO2 16 (*)    Glucose, Bld 191 (*)    BUN 36 (*)    Creatinine, Ser 1.41 (*)    Calcium 8.0 (*)    GFR calc non Af Amer 52 (*)    Anion gap 17 (*)    All other components within normal limits  CBC - Abnormal; Notable for the following components:   WBC 41.3 (*)    RBC 6.70 (*)    Hemoglobin 19.3 (*)    HCT 57.8 (*)    RDW 16.8 (*)    All other components within normal limits  LIPASE, BLOOD - Abnormal; Notable for the following components:   Lipase 1,379 (*)    All other components within normal limits  HEPATIC FUNCTION PANEL - Abnormal; Notable for the following components:   AST 102 (*)    ALT 48 (*)    Total Bilirubin 3.9 (*)    Bilirubin, Direct 0.6 (*)    Indirect Bilirubin 3.3 (*)    All other components within normal limits  LACTIC ACID, PLASMA - Abnormal; Notable for the following components:   Lactic Acid, Venous 8.4 (*)    All other components within normal limits  LACTIC ACID, PLASMA - Abnormal; Notable for the following components:   Lactic Acid, Venous 4.6 (*)    All other components within normal limits  SARS CORONAVIRUS 2 BY RT PCR (HOSPITAL ORDER, [place] [facility])  CULTURE, BLOOD (ROUTINE X 2)  CULTURE, BLOOD (ROUTINE X 2)  TROPONIN I (HIGH SENSITIVITY)  TROPONIN I (HIGH SENSITIVITY)    EKG EKG Interpretation  Date/Time:  Thursday April 26 2020 12:07:19 EDT Ventricular Rate:  143 PR Interval:  122 QRS Duration: 74 QT Interval:  288 QTC Calculation: 444 R Axis:   12 Text Interpretation: Sinus tachycardia Anterior infarct , age  undetermined Abnormal ECG Confirmed by Quintella Reichert [id number])  on 04/26/2020 4:26:33 PM   Radiology CT Abdomen Pelvis Wo Contrast  Result Date: 04/26/2020 CLINICAL DATA:  64 year old male with abdominal pain. History of cholecystectomy. EXAM: CT ABDOMEN AND PELVIS WITHOUT CONTRAST TECHNIQUE: Multidetector CT imaging of the abdomen and pelvis was performed following the standard protocol without IV contrast. COMPARISON:  CT abdomen pelvis dated 03/01/2020. FINDINGS: Evaluation of this exam is limited in the absence of intravenous contrast. Lower chest: There are small bilateral pleural effusions and bibasilar patchy and streaky densities, likely atelectasis. Pneumonia is not excluded. Clinical correlation is recommended. No intra-abdominal free air. There is diffuse mesenteric stranding and edema as well as small amount of retroperitoneal fluid extending inferiorly along the paracolic gutters into the pelvis. Hepatobiliary: Mild fatty liver. A percutaneous drain is noted with pigtail tip in similar position. Ill-defined small fluid adjacent to the tip of the drain similar to prior CT. Interval placement of a common bile duct stent with associated pneumobilia. Pancreas: There is interval removal of the pancreatic duct stent. There is diffuse inflammatory changes and stranding of the peripancreatic fat consistent with acute pancreatitis. No drainable fluid collection or abscess. Spleen: Normal in size without focal abnormality. Adrenals/Urinary Tract: The adrenal glands unremarkable. There are multiple nonobstructing bilateral renal calculi measure up to 5 mm in the upper pole of the left kidney. There is no hydronephrosis on either side. The visualized ureters and urinary bladder appear unremarkable. Stomach/Bowel: There is no bowel obstruction or active inflammation. There is sigmoid diverticulosis without active inflammatory changes. Small scattered colonic diverticula. The appendix is normal.  Vascular/Lymphatic: Mild aortoiliac atherosclerotic disease. The IVC is unremarkable. No portal venous gas. There is no adenopathy. Reproductive: The prostate and seminal vesicles are Other: None Musculoskeletal: No acute or significant osseous findings. IMPRESSION: 1. Acute pancreatitis. No drainable fluid collection or abscess. 2. Interval placement of a common bile duct stent with associated pneumobilia. 3. Percutaneous drain with pigtail tip in similar position. Ill-defined small fluid adjacent to the tip of the drain similar to prior CT. 4. Small bilateral pleural effusions and bibasilar patchy and streaky densities, likely atelectasis. Pneumonia is not excluded. Clinical correlation is recommended. 5. Colonic diverticulosis. No bowel obstruction. Normal appendix. 6. Nonobstructing bilateral renal calculi. No hydronephrosis. 7. Aortic Atherosclerosis (ICD10-I70.0). Electronically Signed   By: Anner Crete M.D.   On: 04/26/2020 21:04   DG Chest 2 View  Result Date: 04/26/2020 CLINICAL DATA:  64 year old male with chest pain weakness and shortness of breath. History of cholecystectomy complicated by gallbladder fossa abscess requiring percutaneous drainage. Metallic biliary stent. EXAM: CHEST - 2 VIEW COMPARISON:  Portable chest 02/13/2020 and earlier. FINDINGS: Continued low lung volumes. Normal cardiac size and mediastinal contours. Visualized tracheal air column is within normal limits. Patchy opacity at the right lung base appears similar to June. Favor atelectasis. No pneumothorax, pulmonary edema or pleural effusion. No acute osseous abnormality identified. Right upper quadrant metal biliary stent and pigtail drainage catheter in place. Negative visible bowel gas pattern. IMPRESSION: 1. Continued low lung volumes with patchy right lung base opacity favored to be atelectasis. 2. Right upper quadrant metal biliary stent and pigtail drainage catheter in place. Electronically Signed   By: Genevie Ann M.D.    On: 04/26/2020 12:38   DG ERCP BILIARY & PANCREATIC DUCTS  Result Date: 04/26/2020 CLINICAL DATA:  64 year old male with a history of prior biliary stent placement EXAM: ERCP TECHNIQUE: Multiple spot images obtained with the fluoroscopic device and submitted for interpretation post-procedure. FLUOROSCOPY TIME:  Fluoroscopy  Time:  57 seconds COMPARISON:  03/21/2020 FINDINGS: Limited images during ERCP. Initial image demonstrates the endoscope projecting over the upper abdomen with a plastic biliary stent in position. Pigtail catheter within the upper abdomen. Subsequently there is removal of a plastic biliary stent and placement of a safety wire. Partial opacification of the extrahepatic biliary system. Deployment of a balloon retrieval catheter and placement of a metallic biliary stent. IMPRESSION: Limited images during ERCP demonstrates removal plastic biliary catheter, deployment of balloon retrieval catheter, and placement of metallic biliary stent. Please refer to the dictated operative report for full details of intraoperative findings and procedure. Electronically Signed   By: Corrie Mckusick D.O.   On: 04/26/2020 09:35    Procedures Procedures (including critical care time)  Medications Ordered in ED Medications  piperacillin-tazobactam (ZOSYN) IVPB 3.375 g (has no administration in time range)  0.9 %  sodium chloride infusion (has no administration in time range)  HYDROmorphone (DILAUDID) injection 1 mg (1 mg Intravenous Given 04/26/20 1734)  ondansetron (ZOFRAN) injection 4 mg (4 mg Intravenous Given 04/26/20 1734)  piperacillin-tazobactam (ZOSYN) IVPB 3.375 g (0 g Intravenous Stopped 04/26/20 1844)  sodium chloride 0.9 % bolus 1,000 mL (0 mLs Intravenous Stopped 04/26/20 1844)  sodium chloride 0.9 % bolus 2,000 mL (0 mLs Intravenous Stopped 04/26/20 2141)  ondansetron (ZOFRAN) injection 4 mg (4 mg Intravenous Given 04/26/20 1934)    ED Course  I have reviewed the triage vital signs and the  nursing notes.  Pertinent labs & imaging results that were available during my care of the patient were reviewed by me and considered in my medical decision making (see chart for details).  64 year old male presents with severe abdominal pain with N/V. He is tachycardic and uncomfortable appearing. Abdomen is soft and diffusely tender. Labs from yesterday shows leukocytosis (21), polycythemia (17), mild AKI (SCr 1.3), elevated lipase (1,431). Labs repeated today shows worsening leukocytosis (41) and elevated hgb (19) which likely represents worsening hemoconcentration from N/V. AKI is similar (SCr 1.4), Lipase is 1,379. Bilirubin is 3.9. Lactate is 8.4. Blood cultures obtained.   5:38 PM Discussed with Dr. Havery Moros - he states that Dr. Raquel James is coming to see the patient and their APP has seen him. Will await further recommendations.  Discussed with Dr. Raquel James. Pt is empirically being covered for intra-abdominal infection with Zosyn however he feels that pt should be aggressively rehydrated and labs reflect more dehydration than infection. He recommends obtaining a non-contrasted CT and to admit to medicine.  CT shows acute pancreatitis and possible pneumonia vs atelectasis. Pt has received 3000L NS and is on NS infusion. Plan is to admit to hospitalist for further management and GI will see in the AM  MDM Rules/Calculators/A&P                           Final Clinical Impression(s) / ED Diagnoses Final diagnoses:  Other acute pancreatitis, unspecified complication status  AKI (acute kidney injury) [facility])    Rx / DC Orders ED Discharge Orders    None       Recardo Evangelist, PA-C 04/27/20 0015    Quintella Reichert, MD 04/27/20 [id number]

## 2020-04-26 NOTE — Telephone Encounter (Signed)
Patient called asking if someone can go see him currently at ED

## 2020-04-26 NOTE — ED Notes (Signed)
Pt called for VS recheck, no response  

## 2020-04-27 ENCOUNTER — Inpatient Hospital Stay (HOSPITAL_COMMUNITY): Payer: 59

## 2020-04-27 ENCOUNTER — Encounter (HOSPITAL_COMMUNITY): Payer: Self-pay | Admitting: Gastroenterology

## 2020-04-27 DIAGNOSIS — K9189 Other postprocedural complications and disorders of digestive system: Secondary | ICD-10-CM | POA: Diagnosis present

## 2020-04-27 DIAGNOSIS — D751 Secondary polycythemia: Secondary | ICD-10-CM

## 2020-04-27 DIAGNOSIS — K810 Acute cholecystitis: Secondary | ICD-10-CM | POA: Diagnosis not present

## 2020-04-27 DIAGNOSIS — J690 Pneumonitis due to inhalation of food and vomit: Secondary | ICD-10-CM | POA: Diagnosis not present

## 2020-04-27 DIAGNOSIS — K838 Other specified diseases of biliary tract: Secondary | ICD-10-CM | POA: Diagnosis not present

## 2020-04-27 DIAGNOSIS — K668 Other specified disorders of peritoneum: Secondary | ICD-10-CM | POA: Diagnosis not present

## 2020-04-27 DIAGNOSIS — D649 Anemia, unspecified: Secondary | ICD-10-CM | POA: Diagnosis not present

## 2020-04-27 DIAGNOSIS — I4891 Unspecified atrial fibrillation: Secondary | ICD-10-CM | POA: Diagnosis not present

## 2020-04-27 DIAGNOSIS — K8689 Other specified diseases of pancreas: Secondary | ICD-10-CM | POA: Diagnosis not present

## 2020-04-27 DIAGNOSIS — Z20822 Contact with and (suspected) exposure to covid-19: Secondary | ICD-10-CM | POA: Diagnosis present

## 2020-04-27 DIAGNOSIS — R1010 Upper abdominal pain, unspecified: Secondary | ICD-10-CM | POA: Diagnosis not present

## 2020-04-27 DIAGNOSIS — D45 Polycythemia vera: Secondary | ICD-10-CM | POA: Diagnosis not present

## 2020-04-27 DIAGNOSIS — Z7189 Other specified counseling: Secondary | ICD-10-CM | POA: Diagnosis not present

## 2020-04-27 DIAGNOSIS — K2980 Duodenitis without bleeding: Secondary | ICD-10-CM | POA: Diagnosis not present

## 2020-04-27 DIAGNOSIS — K311 Adult hypertrophic pyloric stenosis: Secondary | ICD-10-CM | POA: Diagnosis present

## 2020-04-27 DIAGNOSIS — K2210 Ulcer of esophagus without bleeding: Secondary | ICD-10-CM | POA: Diagnosis present

## 2020-04-27 DIAGNOSIS — K859 Acute pancreatitis without necrosis or infection, unspecified: Secondary | ICD-10-CM | POA: Diagnosis not present

## 2020-04-27 DIAGNOSIS — K315 Obstruction of duodenum: Secondary | ICD-10-CM | POA: Diagnosis present

## 2020-04-27 DIAGNOSIS — J918 Pleural effusion in other conditions classified elsewhere: Secondary | ICD-10-CM | POA: Diagnosis present

## 2020-04-27 DIAGNOSIS — D72829 Elevated white blood cell count, unspecified: Secondary | ICD-10-CM | POA: Diagnosis not present

## 2020-04-27 DIAGNOSIS — J90 Pleural effusion, not elsewhere classified: Secondary | ICD-10-CM | POA: Diagnosis not present

## 2020-04-27 DIAGNOSIS — R140 Abdominal distension (gaseous): Secondary | ICD-10-CM | POA: Diagnosis not present

## 2020-04-27 DIAGNOSIS — K209 Esophagitis, unspecified without bleeding: Secondary | ICD-10-CM | POA: Diagnosis not present

## 2020-04-27 DIAGNOSIS — K8512 Biliary acute pancreatitis with infected necrosis: Secondary | ICD-10-CM | POA: Diagnosis not present

## 2020-04-27 DIAGNOSIS — N179 Acute kidney failure, unspecified: Secondary | ICD-10-CM | POA: Diagnosis present

## 2020-04-27 DIAGNOSIS — Z66 Do not resuscitate: Secondary | ICD-10-CM | POA: Diagnosis not present

## 2020-04-27 DIAGNOSIS — A419 Sepsis, unspecified organism: Secondary | ICD-10-CM

## 2020-04-27 DIAGNOSIS — G9340 Encephalopathy, unspecified: Secondary | ICD-10-CM | POA: Diagnosis not present

## 2020-04-27 DIAGNOSIS — K55069 Acute infarction of intestine, part and extent unspecified: Secondary | ICD-10-CM | POA: Diagnosis present

## 2020-04-27 DIAGNOSIS — K8581 Other acute pancreatitis with uninfected necrosis: Secondary | ICD-10-CM | POA: Diagnosis present

## 2020-04-27 DIAGNOSIS — J9811 Atelectasis: Secondary | ICD-10-CM | POA: Diagnosis present

## 2020-04-27 DIAGNOSIS — K831 Obstruction of bile duct: Secondary | ICD-10-CM | POA: Diagnosis present

## 2020-04-27 DIAGNOSIS — R6520 Severe sepsis without septic shock: Secondary | ICD-10-CM | POA: Diagnosis not present

## 2020-04-27 DIAGNOSIS — Y838 Other surgical procedures as the cause of abnormal reaction of the patient, or of later complication, without mention of misadventure at the time of the procedure: Secondary | ICD-10-CM | POA: Diagnosis present

## 2020-04-27 DIAGNOSIS — E872 Acidosis, unspecified: Secondary | ICD-10-CM

## 2020-04-27 DIAGNOSIS — Z4659 Encounter for fitting and adjustment of other gastrointestinal appliance and device: Secondary | ICD-10-CM | POA: Diagnosis not present

## 2020-04-27 DIAGNOSIS — K651 Peritoneal abscess: Secondary | ICD-10-CM | POA: Diagnosis not present

## 2020-04-27 DIAGNOSIS — E43 Unspecified severe protein-calorie malnutrition: Secondary | ICD-10-CM | POA: Diagnosis present

## 2020-04-27 DIAGNOSIS — R0789 Other chest pain: Secondary | ICD-10-CM | POA: Diagnosis not present

## 2020-04-27 DIAGNOSIS — D62 Acute posthemorrhagic anemia: Secondary | ICD-10-CM | POA: Diagnosis not present

## 2020-04-27 DIAGNOSIS — R112 Nausea with vomiting, unspecified: Secondary | ICD-10-CM | POA: Diagnosis not present

## 2020-04-27 DIAGNOSIS — J80 Acute respiratory distress syndrome: Secondary | ICD-10-CM | POA: Diagnosis not present

## 2020-04-27 DIAGNOSIS — J9601 Acute respiratory failure with hypoxia: Secondary | ICD-10-CM | POA: Diagnosis not present

## 2020-04-27 DIAGNOSIS — K8309 Other cholangitis: Secondary | ICD-10-CM | POA: Diagnosis present

## 2020-04-27 DIAGNOSIS — K2091 Esophagitis, unspecified with bleeding: Secondary | ICD-10-CM | POA: Diagnosis not present

## 2020-04-27 DIAGNOSIS — T8189XD Other complications of procedures, not elsewhere classified, subsequent encounter: Secondary | ICD-10-CM | POA: Diagnosis not present

## 2020-04-27 DIAGNOSIS — R6521 Severe sepsis with septic shock: Secondary | ICD-10-CM | POA: Diagnosis present

## 2020-04-27 DIAGNOSIS — K858 Other acute pancreatitis without necrosis or infection: Secondary | ICD-10-CM | POA: Diagnosis not present

## 2020-04-27 DIAGNOSIS — G9341 Metabolic encephalopathy: Secondary | ICD-10-CM | POA: Diagnosis not present

## 2020-04-27 DIAGNOSIS — R410 Disorientation, unspecified: Secondary | ICD-10-CM | POA: Diagnosis not present

## 2020-04-27 DIAGNOSIS — E871 Hypo-osmolality and hyponatremia: Secondary | ICD-10-CM | POA: Diagnosis present

## 2020-04-27 DIAGNOSIS — K839 Disease of biliary tract, unspecified: Secondary | ICD-10-CM | POA: Diagnosis not present

## 2020-04-27 DIAGNOSIS — Y848 Other medical procedures as the cause of abnormal reaction of the patient, or of later complication, without mention of misadventure at the time of the procedure: Secondary | ICD-10-CM | POA: Diagnosis present

## 2020-04-27 DIAGNOSIS — A4159 Other Gram-negative sepsis: Secondary | ICD-10-CM | POA: Diagnosis present

## 2020-04-27 DIAGNOSIS — K2971 Gastritis, unspecified, with bleeding: Secondary | ICD-10-CM | POA: Diagnosis present

## 2020-04-27 DIAGNOSIS — R1084 Generalized abdominal pain: Secondary | ICD-10-CM | POA: Diagnosis not present

## 2020-04-27 DIAGNOSIS — K920 Hematemesis: Secondary | ICD-10-CM | POA: Diagnosis not present

## 2020-04-27 DIAGNOSIS — K8511 Biliary acute pancreatitis with uninfected necrosis: Secondary | ICD-10-CM | POA: Diagnosis present

## 2020-04-27 DIAGNOSIS — Z515 Encounter for palliative care: Secondary | ICD-10-CM | POA: Diagnosis not present

## 2020-04-27 DIAGNOSIS — K567 Ileus, unspecified: Secondary | ICD-10-CM | POA: Diagnosis not present

## 2020-04-27 DIAGNOSIS — R109 Unspecified abdominal pain: Secondary | ICD-10-CM | POA: Diagnosis present

## 2020-04-27 LAB — COMPREHENSIVE METABOLIC PANEL
ALT: 40 U/L (ref 0–44)
AST: 88 U/L — ABNORMAL HIGH (ref 15–41)
Albumin: 2.8 g/dL — ABNORMAL LOW (ref 3.5–5.0)
Alkaline Phosphatase: 42 U/L (ref 38–126)
Anion gap: 16 — ABNORMAL HIGH (ref 5–15)
BUN: 44 mg/dL — ABNORMAL HIGH (ref 8–23)
CO2: 15 mmol/L — ABNORMAL LOW (ref 22–32)
Calcium: 6.3 mg/dL — CL (ref 8.9–10.3)
Chloride: 108 mmol/L (ref 98–111)
Creatinine, Ser: 1.34 mg/dL — ABNORMAL HIGH (ref 0.61–1.24)
GFR calc Af Amer: >60 mL/min (ref >60)
GFR calc non Af Amer: 56 mL/min — ABNORMAL LOW (ref >60)
Glucose, Bld: 176 mg/dL — ABNORMAL HIGH (ref 70–99)
Potassium: 4.4 mmol/L (ref 3.5–5.1)
Sodium: 139 mmol/L (ref 135–145)
Total Bilirubin: 3.7 mg/dL — ABNORMAL HIGH (ref 0.3–1.2)
Total Protein: 5.2 g/dL — ABNORMAL LOW (ref 6.5–8.1)

## 2020-04-27 LAB — CBC WITH DIFFERENTIAL/PLATELET
Abs Immature Granulocytes: 0 10*3/uL (ref 0.00–0.07)
Basophils Absolute: 0 10*3/uL (ref 0.0–0.1)
Basophils Relative: 0 %
Eosinophils Absolute: 0 10*3/uL (ref 0.0–0.5)
Eosinophils Relative: 0 %
HCT: 51.9 % (ref 39.0–52.0)
Hemoglobin: 16.8 g/dL (ref 13.0–17.0)
Lymphocytes Relative: 2 %
Lymphs Abs: 0.5 10*3/uL — ABNORMAL LOW (ref 0.7–4.0)
MCH: 29.5 pg (ref 26.0–34.0)
MCHC: 32.4 g/dL (ref 30.0–36.0)
MCV: 91.1 fL (ref 80.0–100.0)
Monocytes Absolute: 1.3 10*3/uL — ABNORMAL HIGH (ref 0.1–1.0)
Monocytes Relative: 5 %
Neutro Abs: 23.6 10*3/uL — ABNORMAL HIGH (ref 1.7–7.7)
Neutrophils Relative %: 93 %
Platelets: 161 10*3/uL (ref 150–400)
RBC: 5.7 MIL/uL (ref 4.22–5.81)
RDW: 15.9 % — ABNORMAL HIGH (ref 11.5–15.5)
WBC: 25.4 10*3/uL — ABNORMAL HIGH (ref 4.0–10.5)
nRBC: 0 % (ref 0.0–0.2)
nRBC: 0 /100 WBC

## 2020-04-27 LAB — LIPASE, BLOOD: Lipase: 540 U/L — ABNORMAL HIGH (ref 11–51)

## 2020-04-27 LAB — LACTIC ACID, PLASMA: Lactic Acid, Venous: 3.1 mmol/L (ref 0.5–1.9)

## 2020-04-27 MED ORDER — ENOXAPARIN SODIUM 40 MG/0.4ML ~~LOC~~ SOLN
40.0000 mg | SUBCUTANEOUS | Status: DC
  Administered 2020-04-27: 40 mg via SUBCUTANEOUS
  Filled 2020-04-27: qty 0.4

## 2020-04-27 MED ORDER — CALCIUM GLUCONATE-NACL 1-0.675 GM/50ML-% IV SOLN
1.0000 g | Freq: Once | INTRAVENOUS | Status: AC
  Administered 2020-04-27: 1000 mg via INTRAVENOUS
  Filled 2020-04-27: qty 50

## 2020-04-27 MED ORDER — HYDROMORPHONE HCL 1 MG/ML IJ SOLN
1.0000 mg | INTRAMUSCULAR | Status: DC | PRN
  Administered 2020-04-27 – 2020-05-16 (×50): 1 mg via INTRAVENOUS
  Filled 2020-04-27 (×52): qty 1

## 2020-04-27 MED ORDER — SODIUM CHLORIDE 0.9 % IV BOLUS
1000.0000 mL | Freq: Once | INTRAVENOUS | Status: AC
  Administered 2020-04-27: 1000 mL via INTRAVENOUS

## 2020-04-27 NOTE — Progress Notes (Signed)
No charge note  Patient seen and examined this morning, admitted overnight, H&P reviewed and agree with the assessment and plan  64 y.o. male with medical history significant of hypertension, ERCP on 8/18 with biliary stent exchange for persistent postoperative bile leak after recent cholecystectomy with abscess status post percutaneous drainage presenting with complaints of severe epigastric abdominal pain, nausea, and vomiting since after the ERCP procedure  Remains in significant pain this morning.   Post ERCP acute pancreatitis with concern for persistent biliary leak and presumed sepsis of biliary origin - continue Zosyn, supportive tx with fluids, NPO, antiemetics. GI consulted and following  AKI - continue fluids  HTN - hold antihypertensives for now  Adam Mapes M. Cruzita Lederer, MD, PhD [facility]  Between 7 am - 7 pm you can contact me via [place] or [place].  I am not available 7 pm - 7 am, please contact night coverage MD/APP via [place]

## 2020-04-27 NOTE — H&P (Signed)
History and Physical    Raequan Vanschaick CEY:[id number] DOB: Mar 26, 1956 DOA: 04/26/2020  PCP: Melony Overly, MD Patient coming from: Home  Chief Complaint: Abdominal pain  HPI: Javarian Jakubiak is a 64 y.o. male with medical history significant of hypertension, ERCP on 8/18 with biliary stent exchange for persistent postoperative bile leak after recent cholecystectomy with abscess status post percutaneous drainage presenting with complaints of severe epigastric abdominal pain, nausea, and vomiting since after the ERCP procedure.  States he has not been able to tolerate any p.o. intake and his urine is dark in color.  States he is so uncomfortable from the pain and his abdomen is distended which is making him take shallow breaths.  Denies fevers or chills.  He has been vaccinated against Covid.  Denies cough, shortness of breath, or chest pain.  ED Course: Afebrile.  Persistently tachycardic.  Not hypotensive.  Labs showing significant leukocytosis with WBC count 41.3.  Hemoglobin 19.3, was in the 12-13 range 2 months ago.  Bicarb 16, anion gap 17.  Blood glucose 191.  BUN 36, creatinine 1.4.  Baseline creatinine 0.7.  High-sensitivity troponin negative x2.  Lactate 8.4> 4.6.  Lipase 1379.  AST 102, ALT 48, alk phos 69, and T bili 3.9.  Blood culture x2 pending.  SARS-CoV-2 PCR test negative.  CT abdomen pelvis showing findings consistent with acute pancreatitis.  No drainable fluid collection or abscess.  Chest x-ray showing low lung volumes with patchy right lung opacity favored to be atelectasis.  GI consulted and feels this is post ERCP pancreatitis.  Recommending aggressive IV fluid hydration, pain control, and empiric antibiotics.  Patient was given Zosyn, Dilaudid, Zofran, and 3 L IV fluid boluses.  Review of Systems:  All systems reviewed and apart from history of presenting illness, are negative.  Past Medical History:  Diagnosis Date  . History of kidney stones    passed  .  Hypertension   . Insomnia     Past Surgical History:  Procedure Laterality Date  . ARTHROSCOPIC REPAIR ACL Bilateral    left, 2012; right 2004  . BILIARY STENT PLACEMENT  01/23/2020   Procedure: BILIARY STENT PLACEMENT;  Surgeon: Irving Copas., MD;  Location: [place];  Service: Gastroenterology;;  . BILIARY STENT PLACEMENT  03/21/2020   Procedure: BILIARY STENT PLACEMENT;  Surgeon: Irving Copas., MD;  Location: [place];  Service: Gastroenterology;;  . CHOLECYSTECTOMY N/A 01/24/2020   Procedure: LAPAROSCOPIC CHOLECYSTECTOMY;  Surgeon: Jesusita Oka, MD;  Location: [place];  Service: General;  Laterality: N/A;  . COLONOSCOPY    . ENDOSCOPIC RETROGRADE CHOLANGIOPANCREATOGRAPHY (ERCP) WITH PROPOFOL N/A 03/21/2020   Procedure: ENDOSCOPIC RETROGRADE CHOLANGIOPANCREATOGRAPHY (ERCP) WITH PROPOFOL;  Surgeon: Rush Landmark Telford Nab., MD;  Location: [place];  Service: Gastroenterology;  Laterality: N/A;  . ERCP N/A 01/21/2020   Procedure: ENDOSCOPIC RETROGRADE CHOLANGIOPANCREATOGRAPHY (ERCP);  Surgeon: Ladene Artist, MD;  Location: [facility] ENDOSCOPY;  Service: Endoscopy;  Laterality: N/A;  . ERCP N/A 01/23/2020   Procedure: ENDOSCOPIC RETROGRADE CHOLANGIOPANCREATOGRAPHY (ERCP);  Surgeon: Irving Copas., MD;  Location: [place];  Service: Gastroenterology;  Laterality: N/A;  . IR RADIOLOGIST EVAL & MGMT  03/01/2020  . IR RADIOLOGIST EVAL & MGMT  04/10/2020  . PANCREATIC STENT PLACEMENT  01/21/2020   Procedure: PANCREATIC STENT PLACEMENT;  Surgeon: Ladene Artist, MD;  Location: [place];  Service: Endoscopy;;  . PANCREATIC STENT PLACEMENT  01/23/2020   Procedure: PANCREATIC STENT PLACEMENT;  Surgeon: Irving Copas., MD;  Location: [place];  Service:  Gastroenterology;;  . [name]  01/23/2020   Procedure: [name];  Surgeon: Irving Copas., MD;  Location: [place];  Service: Gastroenterology;;  . Lavell Islam REMOVAL  03/21/2020    Procedure: STENT REMOVAL;  Surgeon: Irving Copas., MD;  Location: [place];  Service: Gastroenterology;;     reports that he has never smoked. He has never used smokeless tobacco. He reports previous alcohol use. He reports that he does not use drugs.  No Known Allergies  Family History  Problem Relation Age of Onset  . Colon cancer Neg Hx   . Stomach cancer Neg Hx     Prior to Admission medications   Medication Sig Start Date End Date Taking? Authorizing Provider  Calcium Carb-Cholecalciferol (CALCIUM+D3 PO) Take 1 tablet by mouth daily.   Yes [provider]  Coenzyme Q10 (COQ10) 400 MG CAPS Take 800 mg by mouth in the morning.    Yes [provider]  Cyanocobalamin (VITAMIN B-12) 5000 MCG TBDP Take 5,000 mcg by mouth daily.   Yes [provider]  GLUCOSAMINE-CHONDROITIN PO Take 1 tablet by mouth in the morning and at bedtime.    Yes [provider]  Javier Docker Oil 1000 MG CAPS Take 1,000 mg by mouth in the morning and at bedtime.    Yes [provider]  lisinopril-hydrochlorothiazide (ZESTORETIC) 10-12.5 MG tablet Take 1 tablet by mouth daily. 03/04/20  Yes [provider]  Moringa Oleifera (MORINGA PO) Take 1,000 mg by mouth 2 (two) times daily.   Yes [provider]  Multiple Vitamin (MULTIVITAMIN WITH MINERALS) TABS tablet Take 1 tablet by mouth daily.   Yes [provider]  ondansetron (ZOFRAN) 4 MG tablet Take 1 tablet (4 mg total) by mouth every 8 (eight) hours as needed for nausea or vomiting. 04/25/20  Yes Mansouraty, Telford Nab., MD  OVER THE COUNTER MEDICATION Take 1 tablet by mouth See admin instructions. [facility] otc tablet- Take 1 tablet by mouth once a day   Yes [provider]  oxyCODONE (OXY IR/ROXICODONE) 5 MG immediate release tablet Take 5 mg by mouth 2 (two) times daily as needed (for pain).  04/25/20  Yes [provider]  pravastatin (PRAVACHOL) 80 MG tablet Take 40  mg by mouth in the morning and at bedtime.   Yes [provider]  Red Yeast Rice 600 MG CAPS Take 1,200 mg by mouth at bedtime.    Yes [provider]  sertraline (ZOLOFT) 100 MG tablet Take 100 mg by mouth daily. 11/08/19  Yes [provider]  TURMERIC PO Take 1,500 mg by mouth 2 (two) times daily.   Yes [provider]  zolpidem (AMBIEN) 5 MG tablet Take 5 mg by mouth at bedtime. 12/29/19  Yes [provider]  aspirin EC 81 MG tablet Take 81 mg by mouth 2 (two) times daily. Patient not taking: Reported on 04/26/2020    [provider]  Calcium Carb-Cholecalciferol (CALCIUM 1000 + D PO) Take 1 tablet by mouth daily. Patient not taking: Reported on 04/26/2020    [provider]  ibuprofen (ADVIL) 200 MG tablet Take 400-600 mg by mouth every 6 (six) hours as needed for headache or moderate pain.    [provider]  oxycodone (OXY-IR) 5 MG capsule Take 2 capsules (10 mg total) by mouth every 4 (four) hours as needed for up to 3 days for pain. Patient not taking: Reported on 04/26/2020 04/25/20 04/28/20  Mansouraty, Telford Nab., MD    Physical Exam: Vitals:  04/27/20 0240 04/27/20 0328 04/27/20 0330 04/27/20 0400  BP:   137/88 (!) 128/94  Pulse: (!) 117  (!) 114 (!) 117  Resp: (!) 23  (!) 22 19  Temp:      TempSrc:      SpO2: 93% 92% 92% 92%  Weight:      Height:        Physical Exam Constitutional:      General: He is not in acute distress. HENT:     Head: Normocephalic and atraumatic.  Eyes:     Extraocular Movements: Extraocular movements intact.     Conjunctiva/sclera: Conjunctivae normal.  Cardiovascular:     Rate and Rhythm: Regular rhythm. Tachycardia present.     Pulses: Normal pulses.     Comments: Tachycardic with heart rate around 120 Pulmonary:     Breath sounds: No wheezing or rales.     Comments: Slightly tachypneic Anterior lung fields clear to auscultation Abdominal:     General: Bowel sounds are  normal. There is distension.     Tenderness: There is abdominal tenderness.     Comments: Abdomen significantly distended with generalized tenderness to even minimal palpation  Musculoskeletal:        General: No swelling or tenderness.     Cervical back: Normal range of motion and neck supple.  Skin:    General: Skin is warm and dry.  Neurological:     General: No focal deficit present.     Mental Status: He is alert and oriented to person, place, and time.     Labs on Admission: I have personally reviewed following labs and imaging studies  CBC: Recent Labs  Lab 04/25/20 2034 04/26/20 1214  WBC 21.4* 41.3*  HGB 17.7* 19.3*  HCT 53.3* 57.8*  MCV 85.8 86.3  PLT 226 480   Basic Metabolic Panel: Recent Labs  Lab 04/25/20 2034 04/26/20 1214  NA 139 136  K 4.8 4.5  CL 100 103  CO2 23 16*  GLUCOSE 223* 191*  BUN 24* 36*  CREATININE 1.32* 1.41*  CALCIUM 9.3 8.0*   GFR: Estimated Creatinine Clearance: 52.9 mL/min (A) (by C-G formula based on SCr of 1.41 mg/dL (H)). Liver Function Tests: Recent Labs  Lab 04/25/20 2034 04/26/20 1716  AST 51* 102*  ALT 43 48*  ALKPHOS 58 69  BILITOT 3.0* 3.9*  PROT 6.9 7.7  ALBUMIN 4.1 4.3   Recent Labs  Lab 04/25/20 2034 04/26/20 1716  LIPASE 1,431* 1,379*   No results for input(s): AMMONIA in the last 168 hours. Coagulation Profile: No results for input(s): INR, PROTIME in the last 168 hours. Cardiac Enzymes: No results for input(s): CKTOTAL, CKMB, CKMBINDEX, TROPONINI in the last 168 hours. BNP (last 3 results) No results for input(s): PROBNP in the last 8760 hours. HbA1C: No results for input(s): HGBA1C in the last 72 hours. CBG: No results for input(s): GLUCAP in the last 168 hours. Lipid Profile: No results for input(s): CHOL, HDL, LDLCALC, TRIG, CHOLHDL, LDLDIRECT in the last 72 hours. Thyroid Function Tests: No results for input(s): TSH, T4TOTAL, FREET4, T3FREE, THYROIDAB in the last 72 hours. Anemia  Panel: No results for input(s): VITAMINB12, FOLATE, FERRITIN, TIBC, IRON, RETICCTPCT in the last 72 hours. Urine analysis:    Component Value Date/Time   COLORURINE AMBER (A) 04/25/2020 2109   APPEARANCEUR CLEAR 04/25/2020 2109   LABSPEC 1.026 04/25/2020 2109   PHURINE 5.0 04/25/2020 2109   GLUCOSEU NEGATIVE 04/25/2020 2109   HGBUR NEGATIVE 04/25/2020 2109   BILIRUBINUR  NEGATIVE 04/25/2020 2109   KETONESUR 5 (A) 04/25/2020 2109   PROTEINUR 30 (A) 04/25/2020 2109   NITRITE NEGATIVE 04/25/2020 2109   LEUKOCYTESUR NEGATIVE 04/25/2020 2109    Radiological Exams on Admission: CT Abdomen Pelvis Wo Contrast  Result Date: 04/26/2020 CLINICAL DATA:  64 year old male with abdominal pain. History of cholecystectomy. EXAM: CT ABDOMEN AND PELVIS WITHOUT CONTRAST TECHNIQUE: Multidetector CT imaging of the abdomen and pelvis was performed following the standard protocol without IV contrast. COMPARISON:  CT abdomen pelvis dated 03/01/2020. FINDINGS: Evaluation of this exam is limited in the absence of intravenous contrast. Lower chest: There are small bilateral pleural effusions and bibasilar patchy and streaky densities, likely atelectasis. Pneumonia is not excluded. Clinical correlation is recommended. No intra-abdominal free air. There is diffuse mesenteric stranding and edema as well as small amount of retroperitoneal fluid extending inferiorly along the paracolic gutters into the pelvis. Hepatobiliary: Mild fatty liver. A percutaneous drain is noted with pigtail tip in similar position. Ill-defined small fluid adjacent to the tip of the drain similar to prior CT. Interval placement of a common bile duct stent with associated pneumobilia. Pancreas: There is interval removal of the pancreatic duct stent. There is diffuse inflammatory changes and stranding of the peripancreatic fat consistent with acute pancreatitis. No drainable fluid collection or abscess. Spleen: Normal in size without focal abnormality.  Adrenals/Urinary Tract: The adrenal glands unremarkable. There are multiple nonobstructing bilateral renal calculi measure up to 5 mm in the upper pole of the left kidney. There is no hydronephrosis on either side. The visualized ureters and urinary bladder appear unremarkable. Stomach/Bowel: There is no bowel obstruction or active inflammation. There is sigmoid diverticulosis without active inflammatory changes. Small scattered colonic diverticula. The appendix is normal. Vascular/Lymphatic: Mild aortoiliac atherosclerotic disease. The IVC is unremarkable. No portal venous gas. There is no adenopathy. Reproductive: The prostate and seminal vesicles are Other: None Musculoskeletal: No acute or significant osseous findings. IMPRESSION: 1. Acute pancreatitis. No drainable fluid collection or abscess. 2. Interval placement of a common bile duct stent with associated pneumobilia. 3. Percutaneous drain with pigtail tip in similar position. Ill-defined small fluid adjacent to the tip of the drain similar to prior CT. 4. Small bilateral pleural effusions and bibasilar patchy and streaky densities, likely atelectasis. Pneumonia is not excluded. Clinical correlation is recommended. 5. Colonic diverticulosis. No bowel obstruction. Normal appendix. 6. Nonobstructing bilateral renal calculi. No hydronephrosis. 7. Aortic Atherosclerosis (ICD10-I70.0). Electronically Signed   By: Anner Crete M.D.   On: 04/26/2020 21:04   DG Chest 2 View  Result Date: 04/26/2020 CLINICAL DATA:  64 year old male with chest pain weakness and shortness of breath. History of cholecystectomy complicated by gallbladder fossa abscess requiring percutaneous drainage. Metallic biliary stent. EXAM: CHEST - 2 VIEW COMPARISON:  Portable chest 02/13/2020 and earlier. FINDINGS: Continued low lung volumes. Normal cardiac size and mediastinal contours. Visualized tracheal air column is within normal limits. Patchy opacity at the right lung base appears  similar to June. Favor atelectasis. No pneumothorax, pulmonary edema or pleural effusion. No acute osseous abnormality identified. Right upper quadrant metal biliary stent and pigtail drainage catheter in place. Negative visible bowel gas pattern. IMPRESSION: 1. Continued low lung volumes with patchy right lung base opacity favored to be atelectasis. 2. Right upper quadrant metal biliary stent and pigtail drainage catheter in place. Electronically Signed   By: Genevie Ann M.D.   On: 04/26/2020 12:38   DG ERCP BILIARY & PANCREATIC DUCTS  Result Date: 04/26/2020 CLINICAL DATA:  64 year old  male with a history of prior biliary stent placement EXAM: ERCP TECHNIQUE: Multiple spot images obtained with the fluoroscopic device and submitted for interpretation post-procedure. FLUOROSCOPY TIME:  Fluoroscopy Time:  57 seconds COMPARISON:  03/21/2020 FINDINGS: Limited images during ERCP. Initial image demonstrates the endoscope projecting over the upper abdomen with a plastic biliary stent in position. Pigtail catheter within the upper abdomen. Subsequently there is removal of a plastic biliary stent and placement of a safety wire. Partial opacification of the extrahepatic biliary system. Deployment of a balloon retrieval catheter and placement of a metallic biliary stent. IMPRESSION: Limited images during ERCP demonstrates removal plastic biliary catheter, deployment of balloon retrieval catheter, and placement of metallic biliary stent. Please refer to the dictated operative report for full details of intraoperative findings and procedure. Electronically Signed   By: Corrie Mckusick D.O.   On: 04/26/2020 09:35    EKG: Independently reviewed.  Sinus tachycardia, rate increased since prior tracing.  Assessment/Plan Principal Problem:   Post-ERCP acute pancreatitis Active Problems:   Severe sepsis (HCC)   Polycythemia   AKI (acute kidney injury) ([place])   Metabolic acidosis   Severe sepsis secondary to post ERCP acute  pancreatitis: Persistently tachycardic.  WBC count 41.3. Lactate 8.4> 4.6.  Lipase 1379.  AST 102, ALT 48, alk phos 69, and T bili 3.9. CT abdomen pelvis showing findings consistent with acute pancreatitis.  No drainable fluid collection or abscess.  Patient underwent ERCP on 8/18 with biliary stent exchange for persistent postop bile leak after recent cholecystectomy with abscess status post percutaneous drainage. -Received 3 L normal saline boluses in the ED.  Continue IV fluid hydration with normal saline at 150 cc/h.  Continue Zosyn.  IV Dilaudid as needed for severe pain.  Zofran as needed for nausea/vomiting.  Keep n.p.o. except ice chips.  Repeat CBC with differential in a.m.  Trend lactate.  Continue to monitor lipase and LFTs.  Blood culture x2 pending.  GI following, appreciate recommendations  Polycythemia: Hemoglobin 19.3, was in the 12-13 range 2 months ago.  Could possibly be due to hemoconcentration. -Repeat CBC in a.m. to confirm  AKI: Likely prerenal due to dehydration/severe sepsis.  BUN 36, creatinine 1.4.  Baseline creatinine 0.7. -IV fluid hydration.  Monitor renal function and urine output.  Avoid nephrotoxic agents.  High anion gap metabolic acidosis: Likely related to lactic acidosis and AKI.  Bicarb 16, anion gap 17.  Lactic acidosis improving with IV fluid resuscitation. -Repeat metabolic panel  Hypertension: Currently normotensive. -Hold antihypertensives in the setting of severe sepsis  DVT prophylaxis: Lovenox Code Status: Full code Family Communication: No family available at this time. Disposition Plan: Status is: Inpatient  Remains inpatient appropriate because:Hemodynamically unstable, Ongoing active pain requiring inpatient pain management, IV treatments appropriate due to intensity of illness or inability to take PO and Inpatient level of care appropriate due to severity of illness   Dispo: The patient is from: Home              Anticipated d/c is to: Home               Anticipated d/c date is: > 3 days              Patient currently is not medically stable to d/c.  The medical decision making on this patient was of high complexity and the patient is at high risk for clinical deterioration, therefore this is a level 3 visit.  Shela Leff MD [facility]  If 7PM-7AM,  please contact night-coverage [email or web address]  04/27/2020, 5:23 AM

## 2020-04-27 NOTE — Progress Notes (Signed)
Progress Note   Subjective  Patient still with significant upper abdominal pain and intermittent more severe abdominal spasms Still in the emergency department hoping to get his hospital room soon He has had ice chips without vomiting Some nausea treated with Zofran and improved with Zofran Pain control is adequate when he receives Dilaudid   Objective  Vital signs in last 24 hours: Temp:  [97.8 F (36.6 C)-98.3 F (36.8 C)] 98.3 F (36.8 C) (08/20 1500) Pulse Rate:  [102-134] 118 (08/20 1500) Resp:  [16-36] 25 (08/20 1500) BP: (110-142)/(77-101) 112/81 (08/20 1500) SpO2:  [90 %-94 %] 91 % (08/20 1500)    General: Alert, uncomfortable appearing male lying in bed Heart: Regular but mildly tachycardic Chest: Clear to ascultation bilaterally Abdomen:  Soft, diffusely tender without rebound or guarding, present but hypoactive bowel sounds    Extremities:  Without edema. Neurologic:  Alert and  oriented x4; grossly normal neurologically. Psych:  Alert and cooperative. Normal mood and affect.  Intake/Output from previous day: No intake/output data recorded. Intake/Output this shift: Total I/O In: -  Out: 400 [Urine:400]  Lab Results: Recent Labs    04/25/20 2034 04/26/20 1214 04/27/20 0900  WBC 21.4* 41.3* 25.4*  HGB 17.7* 19.3* 16.8  HCT 53.3* 57.8* 51.9  PLT 226 260 161   BMET Recent Labs    04/25/20 2034 04/26/20 1214 04/27/20 0900  NA 139 136 139  K 4.8 4.5 4.4  CL 100 103 108  CO2 23 16* 15*  GLUCOSE 223* 191* 176*  BUN 24* 36* 44*  CREATININE 1.32* 1.41* 1.34*  CALCIUM 9.3 8.0* 6.3*   LFT Recent Labs    04/26/20 1716 04/26/20 1716 04/27/20 0900  PROT 7.7   < > 5.2*  ALBUMIN 4.3   < > 2.8*  AST 102*   < > 88*  ALT 48*   < > 40  ALKPHOS 69   < > 42  BILITOT 3.9*   < > 3.7*  BILIDIR 0.6*  --   --   IBILI 3.3*  --   --    < > = values in this interval not displayed.   PT/INR No results for input(s): LABPROT, INR in the last 72  hours. Hepatitis Panel No results for input(s): HEPBSAG, HCVAB, HEPAIGM, HEPBIGM in the last 72 hours.  Studies/Results: CT Abdomen Pelvis Wo Contrast  Result Date: 04/26/2020 CLINICAL DATA:  64 year old male with abdominal pain. History of cholecystectomy. EXAM: CT ABDOMEN AND PELVIS WITHOUT CONTRAST TECHNIQUE: Multidetector CT imaging of the abdomen and pelvis was performed following the standard protocol without IV contrast. COMPARISON:  CT abdomen pelvis dated 03/01/2020. FINDINGS: Evaluation of this exam is limited in the absence of intravenous contrast. Lower chest: There are small bilateral pleural effusions and bibasilar patchy and streaky densities, likely atelectasis. Pneumonia is not excluded. Clinical correlation is recommended. No intra-abdominal free air. There is diffuse mesenteric stranding and edema as well as small amount of retroperitoneal fluid extending inferiorly along the paracolic gutters into the pelvis. Hepatobiliary: Mild fatty liver. A percutaneous drain is noted with pigtail tip in similar position. Ill-defined small fluid adjacent to the tip of the drain similar to prior CT. Interval placement of a common bile duct stent with associated pneumobilia. Pancreas: There is interval removal of the pancreatic duct stent. There is diffuse inflammatory changes and stranding of the peripancreatic fat consistent with acute pancreatitis. No drainable fluid collection or abscess. Spleen: Normal in size without focal abnormality. Adrenals/Urinary Tract: The adrenal  glands unremarkable. There are multiple nonobstructing bilateral renal calculi measure up to 5 mm in the upper pole of the left kidney. There is no hydronephrosis on either side. The visualized ureters and urinary bladder appear unremarkable. Stomach/Bowel: There is no bowel obstruction or active inflammation. There is sigmoid diverticulosis without active inflammatory changes. Small scattered colonic diverticula. The appendix is  normal. Vascular/Lymphatic: Mild aortoiliac atherosclerotic disease. The IVC is unremarkable. No portal venous gas. There is no adenopathy. Reproductive: The prostate and seminal vesicles are Other: None Musculoskeletal: No acute or significant osseous findings. IMPRESSION: 1. Acute pancreatitis. No drainable fluid collection or abscess. 2. Interval placement of a common bile duct stent with associated pneumobilia. 3. Percutaneous drain with pigtail tip in similar position. Ill-defined small fluid adjacent to the tip of the drain similar to prior CT. 4. Small bilateral pleural effusions and bibasilar patchy and streaky densities, likely atelectasis. Pneumonia is not excluded. Clinical correlation is recommended. 5. Colonic diverticulosis. No bowel obstruction. Normal appendix. 6. Nonobstructing bilateral renal calculi. No hydronephrosis. 7. Aortic Atherosclerosis (ICD10-I70.0). Electronically Signed   By: Anner Crete M.D.   On: 04/26/2020 21:04   DG Chest 2 View  Result Date: 04/26/2020 CLINICAL DATA:  64 year old male with chest pain weakness and shortness of breath. History of cholecystectomy complicated by gallbladder fossa abscess requiring percutaneous drainage. Metallic biliary stent. EXAM: CHEST - 2 VIEW COMPARISON:  Portable chest 02/13/2020 and earlier. FINDINGS: Continued low lung volumes. Normal cardiac size and mediastinal contours. Visualized tracheal air column is within normal limits. Patchy opacity at the right lung base appears similar to June. Favor atelectasis. No pneumothorax, pulmonary edema or pleural effusion. No acute osseous abnormality identified. Right upper quadrant metal biliary stent and pigtail drainage catheter in place. Negative visible bowel gas pattern. IMPRESSION: 1. Continued low lung volumes with patchy right lung base opacity favored to be atelectasis. 2. Right upper quadrant metal biliary stent and pigtail drainage catheter in place. Electronically Signed   By: Genevie Ann M.D.   On: 04/26/2020 12:38      Assessment & Recommendations  64 year old male with a history of cholecystectomy complicated by bile leak status post ERCP on 04/25/2020 with biliary stent exchange for persistent leak admitted with posterior CP pancreatitis  1. Post-ERCP pancreatitis --patient with still considerable abdominal pain though responding to IV Dilaudid.  He has hypocalcemia being treated.  Urine output has not been adequately assessed as the patient walked to the bathroom and voided without collection.  Creatinine has improved slightly though I still think he is intravascularly depleted.  Lipase is downtrending as are AST and ALT.  Total bilirubin is 3.7.  White count has declined from 41K to 25K.  Hemoglobin has declined slightly to 16.8 which is delusional. --additional fluid bolus now x 1 L, continue 150 ml/hour thereafter --pain control with IV dilaudid, PCA if needed --ice chips and sips okay --monitor LFTs --CT does not show new complication other than expected pancreatitis --replace Ca --continue empiric antibiotics with zosyn --we will follow        LOS: 0 days   Adam Fowler  04/27/2020, 3:13 PM

## 2020-04-28 ENCOUNTER — Inpatient Hospital Stay (HOSPITAL_COMMUNITY): Payer: 59

## 2020-04-28 ENCOUNTER — Encounter (HOSPITAL_COMMUNITY): Payer: Self-pay | Admitting: Internal Medicine

## 2020-04-28 DIAGNOSIS — R6520 Severe sepsis without septic shock: Secondary | ICD-10-CM

## 2020-04-28 DIAGNOSIS — D751 Secondary polycythemia: Secondary | ICD-10-CM

## 2020-04-28 DIAGNOSIS — J90 Pleural effusion, not elsewhere classified: Secondary | ICD-10-CM | POA: Diagnosis present

## 2020-04-28 DIAGNOSIS — A419 Sepsis, unspecified organism: Secondary | ICD-10-CM

## 2020-04-28 DIAGNOSIS — J9601 Acute respiratory failure with hypoxia: Secondary | ICD-10-CM | POA: Diagnosis present

## 2020-04-28 DIAGNOSIS — R0602 Shortness of breath: Secondary | ICD-10-CM

## 2020-04-28 DIAGNOSIS — I4891 Unspecified atrial fibrillation: Secondary | ICD-10-CM

## 2020-04-28 LAB — POCT I-STAT 7, (LYTES, BLD GAS, ICA,H+H)
Acid-base deficit: 6 mmol/L — ABNORMAL HIGH (ref 0.0–2.0)
Bicarbonate: 18.9 mmol/L — ABNORMAL LOW (ref 20.0–28.0)
Calcium, Ion: 0.88 mmol/L — CL (ref 1.15–1.40)
HCT: 46 % (ref 39.0–52.0)
Hemoglobin: 15.6 g/dL (ref 13.0–17.0)
O2 Saturation: 80 %
Patient temperature: 98.3
Potassium: 2.8 mmol/L — ABNORMAL LOW (ref 3.5–5.1)
Sodium: 142 mmol/L (ref 135–145)
TCO2: 20 mmol/L — ABNORMAL LOW (ref 22–32)
pCO2 arterial: 34.5 mmHg (ref 32.0–48.0)
pH, Arterial: 7.345 — ABNORMAL LOW (ref 7.350–7.450)
pO2, Arterial: 46 mmHg — ABNORMAL LOW (ref 83.0–108.0)

## 2020-04-28 LAB — LIPASE, BLOOD: Lipase: 158 U/L — ABNORMAL HIGH (ref 11–51)

## 2020-04-28 LAB — PROCALCITONIN: Procalcitonin: 4.82 ng/mL

## 2020-04-28 LAB — COMPREHENSIVE METABOLIC PANEL
ALT: 34 U/L (ref 0–44)
AST: 69 U/L — ABNORMAL HIGH (ref 15–41)
Albumin: 2.4 g/dL — ABNORMAL LOW (ref 3.5–5.0)
Alkaline Phosphatase: 47 U/L (ref 38–126)
Anion gap: 12 (ref 5–15)
BUN: 36 mg/dL — ABNORMAL HIGH (ref 8–23)
CO2: 15 mmol/L — ABNORMAL LOW (ref 22–32)
Calcium: 5.9 mg/dL — CL (ref 8.9–10.3)
Chloride: 111 mmol/L (ref 98–111)
Creatinine, Ser: 1.15 mg/dL (ref 0.61–1.24)
GFR calc Af Amer: >60 mL/min (ref >60)
GFR calc non Af Amer: >60 mL/min (ref >60)
Glucose, Bld: 159 mg/dL — ABNORMAL HIGH (ref 70–99)
Potassium: 4 mmol/L (ref 3.5–5.1)
Sodium: 138 mmol/L (ref 135–145)
Total Bilirubin: 3.2 mg/dL — ABNORMAL HIGH (ref 0.3–1.2)
Total Protein: 4.9 g/dL — ABNORMAL LOW (ref 6.5–8.1)

## 2020-04-28 LAB — CBC
HCT: 46.5 % (ref 39.0–52.0)
Hemoglobin: 14.7 g/dL (ref 13.0–17.0)
MCH: 28.5 pg (ref 26.0–34.0)
MCHC: 31.6 g/dL (ref 30.0–36.0)
MCV: 90.3 fL (ref 80.0–100.0)
Platelets: 136 10*3/uL — ABNORMAL LOW (ref 150–400)
RBC: 5.15 MIL/uL (ref 4.22–5.81)
RDW: 16 % — ABNORMAL HIGH (ref 11.5–15.5)
WBC: 18 10*3/uL — ABNORMAL HIGH (ref 4.0–10.5)
nRBC: 0 % (ref 0.0–0.2)

## 2020-04-28 LAB — BLOOD GAS, ARTERIAL
Acid-base deficit: 7.2 mmol/L — ABNORMAL HIGH (ref 0.0–2.0)
Bicarbonate: 17.5 mmol/L — ABNORMAL LOW (ref 20.0–28.0)
Drawn by: 12971
FIO2: 80
O2 Saturation: 90.5 %
Patient temperature: 36.7
pCO2 arterial: 33.6 mmHg (ref 32.0–48.0)
pH, Arterial: 7.336 — ABNORMAL LOW (ref 7.350–7.450)
pO2, Arterial: 57.2 mmHg — ABNORMAL LOW (ref 83.0–108.0)

## 2020-04-28 LAB — GLUCOSE, CAPILLARY: Glucose-Capillary: 165 mg/dL — ABNORMAL HIGH (ref 70–99)

## 2020-04-28 LAB — TROPONIN I (HIGH SENSITIVITY)
Troponin I (High Sensitivity): 21 ng/L — ABNORMAL HIGH (ref <18)
Troponin I (High Sensitivity): 25 ng/L — ABNORMAL HIGH (ref <18)

## 2020-04-28 LAB — LACTIC ACID, PLASMA
Lactic Acid, Venous: 2.4 mmol/L (ref 0.5–1.9)
Lactic Acid, Venous: 1.5 mmol/L (ref 0.5–1.9)

## 2020-04-28 MED ORDER — TAMSULOSIN HCL 0.4 MG PO CAPS
0.4000 mg | ORAL_CAPSULE | Freq: Two times a day (BID) | ORAL | Status: DC
  Administered 2020-04-28 (×2): 0.4 mg via ORAL
  Filled 2020-04-28 (×2): qty 1

## 2020-04-28 MED ORDER — ETOMIDATE 2 MG/ML IV SOLN
INTRAVENOUS | Status: AC
  Filled 2020-04-28: qty 20

## 2020-04-28 MED ORDER — FUROSEMIDE 10 MG/ML IJ SOLN
80.0000 mg | Freq: Once | INTRAMUSCULAR | Status: AC
  Administered 2020-04-28: 80 mg via INTRAVENOUS

## 2020-04-28 MED ORDER — DEXMEDETOMIDINE HCL IN NACL 400 MCG/100ML IV SOLN
0.2000 ug/kg/h | INTRAVENOUS | Status: DC
  Filled 2020-04-28: qty 100

## 2020-04-28 MED ORDER — FUROSEMIDE 10 MG/ML IJ SOLN
60.0000 mg | Freq: Three times a day (TID) | INTRAMUSCULAR | Status: DC
  Administered 2020-04-29 (×3): 60 mg via INTRAVENOUS
  Filled 2020-04-28 (×3): qty 6

## 2020-04-28 MED ORDER — IOHEXOL 300 MG/ML  SOLN
100.0000 mL | Freq: Once | INTRAMUSCULAR | Status: AC | PRN
  Administered 2020-04-28: 100 mL via INTRAVENOUS

## 2020-04-28 MED ORDER — AMIODARONE HCL IN DEXTROSE 360-4.14 MG/200ML-% IV SOLN
30.0000 mg/h | INTRAVENOUS | Status: DC
  Administered 2020-04-28 – 2020-04-30 (×5): 30 mg/h via INTRAVENOUS
  Filled 2020-04-28 (×5): qty 200

## 2020-04-28 MED ORDER — CALCIUM GLUCONATE-NACL 1-0.675 GM/50ML-% IV SOLN
1.0000 g | Freq: Once | INTRAVENOUS | Status: AC
  Administered 2020-04-28: 1000 mg via INTRAVENOUS
  Filled 2020-04-28: qty 50

## 2020-04-28 MED ORDER — MIDAZOLAM HCL 2 MG/2ML IJ SOLN
INTRAMUSCULAR | Status: AC
  Administered 2020-04-29: 2 mg
  Filled 2020-04-28: qty 4

## 2020-04-28 MED ORDER — DILTIAZEM HCL-DEXTROSE 125-5 MG/125ML-% IV SOLN (PREMIX)
5.0000 mg/h | INTRAVENOUS | Status: DC
  Administered 2020-04-28: 5 mg/h via INTRAVENOUS
  Filled 2020-04-28: qty 125

## 2020-04-28 MED ORDER — DIGOXIN 0.25 MG/ML IJ SOLN
0.2500 mg | Freq: Once | INTRAMUSCULAR | Status: AC
  Administered 2020-04-28: 0.25 mg via INTRAVENOUS
  Filled 2020-04-28: qty 1

## 2020-04-28 MED ORDER — FENTANYL CITRATE (PF) 100 MCG/2ML IJ SOLN
INTRAMUSCULAR | Status: AC
Start: 2020-04-28 — End: 2020-04-29
  Filled 2020-04-28: qty 2

## 2020-04-28 MED ORDER — FUROSEMIDE 10 MG/ML IJ SOLN: 60.0000 mg | Freq: Three times a day (TID) | INTRAMUSCULAR | Status: DC

## 2020-04-28 MED ORDER — METOPROLOL TARTRATE 5 MG/5ML IV SOLN
5.0000 mg | Freq: Three times a day (TID) | INTRAVENOUS | Status: DC | PRN
  Administered 2020-04-28: 5 mg via INTRAVENOUS
  Filled 2020-04-28: qty 5

## 2020-04-28 MED ORDER — ALBUMIN HUMAN 25 % IV SOLN
25.0000 g | Freq: Once | INTRAVENOUS | Status: AC
  Administered 2020-04-28: 25 g via INTRAVENOUS
  Filled 2020-04-28: qty 100

## 2020-04-28 MED ORDER — MORPHINE SULFATE (PF) 2 MG/ML IV SOLN
INTRAVENOUS | Status: AC
  Administered 2020-04-28: 2 mg via INTRAMUSCULAR
  Filled 2020-04-28: qty 1

## 2020-04-28 MED ORDER — DEXMEDETOMIDINE BOLUS VIA INFUSION
1.0000 ug/kg | Freq: Once | INTRAVENOUS | Status: DC
  Filled 2020-04-28: qty 89

## 2020-04-28 MED ORDER — ENOXAPARIN SODIUM 100 MG/ML ~~LOC~~ SOLN
90.0000 mg | Freq: Two times a day (BID) | SUBCUTANEOUS | Status: DC
  Administered 2020-04-28 – 2020-04-29 (×4): 90 mg via SUBCUTANEOUS
  Filled 2020-04-28 (×2): qty 0.9
  Filled 2020-04-28: qty 1
  Filled 2020-04-28 (×4): qty 0.9

## 2020-04-28 MED ORDER — DEXMEDETOMIDINE HCL IN NACL 400 MCG/100ML IV SOLN
0.2000 ug/kg/h | INTRAVENOUS | Status: DC
  Administered 2020-04-28: 0.4 ug/kg/h via INTRAVENOUS

## 2020-04-28 MED ORDER — FUROSEMIDE 10 MG/ML IJ SOLN
80.0000 mg | Freq: Once | INTRAMUSCULAR | Status: AC
  Administered 2020-04-29: 80 mg via INTRAVENOUS
  Filled 2020-04-28: qty 8

## 2020-04-28 MED ORDER — LORAZEPAM 2 MG/ML IJ SOLN
2.0000 mg | Freq: Once | INTRAMUSCULAR | Status: AC
  Administered 2020-04-28: 2 mg via INTRAVENOUS
  Filled 2020-04-28: qty 1

## 2020-04-28 MED ORDER — AMIODARONE HCL IN DEXTROSE 360-4.14 MG/200ML-% IV SOLN
60.0000 mg/h | INTRAVENOUS | Status: DC
  Administered 2020-04-28 (×2): 60 mg/h via INTRAVENOUS
  Filled 2020-04-28 (×2): qty 200

## 2020-04-28 MED ORDER — DILTIAZEM LOAD VIA INFUSION
15.0000 mg | Freq: Once | INTRAVENOUS | Status: AC
  Administered 2020-04-28: 15 mg via INTRAVENOUS
  Filled 2020-04-28: qty 15

## 2020-04-28 MED ORDER — AMIODARONE LOAD VIA INFUSION
150.0000 mg | Freq: Once | INTRAVENOUS | Status: AC
  Administered 2020-04-28: 150 mg via INTRAVENOUS
  Filled 2020-04-28: qty 83.34

## 2020-04-28 MED ORDER — STERILE WATER FOR INJECTION IJ SOLN
INTRAMUSCULAR | Status: AC
  Filled 2020-04-28: qty 10

## 2020-04-28 MED ORDER — ROCURONIUM BROMIDE 10 MG/ML (PF) SYRINGE
PREFILLED_SYRINGE | INTRAVENOUS | Status: AC
  Administered 2020-04-29: 100 mg
  Filled 2020-04-28: qty 10

## 2020-04-28 MED ORDER — CHLORHEXIDINE GLUCONATE CLOTH 2 % EX PADS
6.0000 | MEDICATED_PAD | Freq: Every day | CUTANEOUS | Status: DC
  Administered 2020-04-28 – 2020-06-14 (×44): 6 via TOPICAL

## 2020-04-28 MED ORDER — METHOCARBAMOL 1000 MG/10ML IJ SOLN
500.0000 mg | Freq: Two times a day (BID) | INTRAVENOUS | Status: DC | PRN
  Administered 2020-05-02 – 2020-05-09 (×3): 500 mg via INTRAVENOUS
  Filled 2020-04-28: qty 5
  Filled 2020-04-28: qty 500
  Filled 2020-04-28 (×3): qty 5

## 2020-04-28 MED ORDER — FUROSEMIDE 10 MG/ML IJ SOLN
INTRAMUSCULAR | Status: AC
  Filled 2020-04-28: qty 8

## 2020-04-28 MED ORDER — MORPHINE SULFATE (PF) 2 MG/ML IV SOLN: 2.0000 mg | Freq: Once | INTRAVENOUS | Status: AC

## 2020-04-28 MED ORDER — LACTATED RINGERS IV BOLUS
1000.0000 mL | Freq: Once | INTRAVENOUS | Status: AC
  Administered 2020-04-28: 1000 mL via INTRAVENOUS

## 2020-04-28 NOTE — Progress Notes (Signed)
PROGRESS NOTE    Adam Fowler  NUU:[id number] DOB: 1956-07-06 DOA: 04/26/2020 PCP: Melony Overly, MD     Brief Narrative:  Adam Fowler is a 64 y.o. male PMHx HTN, hypertension, Nephrolithiasis,, s/p ERCP, on 8/18 with biliary stent exchange for persistent postoperative bile leak after recent cholecystectomy with abscess status post percutaneous drainage.  Presenting with complaints of severe epigastric abdominal pain, nausea, and vomiting since after the ERCP procedure.  States he has not been able to tolerate any p.o. intake and his urine is dark in color.  States he is so uncomfortable from the pain and his abdomen is distended which is making him take shallow breaths.  Denies fevers or chills.  He has been vaccinated against Covid.  Denies cough, shortness of breath, or chest pain.  ED Course: Afebrile.  Persistently tachycardic.  Not hypotensive.  Labs showing significant leukocytosis with WBC count 41.3.  Hemoglobin 19.3, was in the 12-13 range 2 months ago.  Bicarb 16, anion gap 17.  Blood glucose 191.  BUN 36, creatinine 1.4.  Baseline creatinine 0.7.  High-sensitivity troponin negative x2.  Lactate 8.4> 4.6.  Lipase 1379.  AST 102, ALT 48, alk phos 69, and T bili 3.9.  Blood culture x2 pending.  SARS-CoV-2 PCR test negative.  CT abdomen pelvis showing findings consistent with acute pancreatitis.  No drainable fluid collection or abscess.  Chest x-ray showing low lung volumes with patchy right lung opacity favored to be atelectasis.  GI consulted and feels this is post ERCP pancreatitis.  Recommending aggressive IV fluid hydration, pain control, and empiric antibiotics.  Patient was given Zosyn, Dilaudid, Zofran, and 3 L IV fluid boluses.    Subjective: A/O x4, negative CP, positive S OB, positive abdominal tenseness, positive back pain.  Patient states he has had cholecystostomy drain since May.   Assessment & Plan: Covid vaccination; vaccinated   Principal  Problem:   Post-ERCP acute pancreatitis Active Problems:   Severe sepsis ([place])   Polycythemia   AKI (acute kidney injury) ([place])   Metabolic acidosis   Acute respiratory failure with hypoxia ([facility])   Bilateral pleural effusion   Severe sepsis secondary to post ERCP acute pancreatitis:  -On admission patient met criteria for severe sepsis WBC count 41.3. Lactate 8.4> 4.6.  Lipase 1379.  AST 102, ALT 48, alk phos 69, and T bili 3.9.  -CT abdomen/pelvis consistent with acute pancreatitis see results below  -Continue current antibiotic regimen -Trend lactic acid Results for [name], Adam Fowler (MRN [id number]) as of 04/28/2020 17:29  Ref. Range 04/26/2020 17:15 04/26/2020 21:51 04/27/2020 09:00 04/28/2020 00:16 04/28/2020 08:04  Lactic Acid, Venous Latest Ref Range: 0.5 - 1.9 mmol/L 8.4 (HH) 4.6 (HH) 3.1 (HH) 2.4 (HH) 1.5  -Lactic acidosis resolved -Patient underwent ERCP on 8/18 with biliary stent exchange for persistent postop bile leak after recent cholecystectomy with abscess status post percutaneous drainage.  Acute respiratory failure with hypoxia -Multifactorial to include atrial fibrillation, bilateral pleural effusion -See pleural effusion -8/21 we will transfer to 65M, 4NP secondary to continuing deteriorating respiratory status.  Bilateral pleural effusion -DC normal saline -Albumin 25 g + Lasix IV 60 mg TID; aggressively diurese -If this does not resolve patient's acute S OB will perform thoracentesis.  Most likely contributing to his A. Fib -Strict in and out -Daily weight  New onset Atrial fibrillation -Most likely multifactorial to include fluid overload, pancreatitis, AKI. -Amiodarone drip -8/21 received digoxin 250 mcg x 1 -8/21 received Cardizem 15 mg x 1 -Metoprolol IV 5  mg PRN  Essential HTN -Currently normotensive -See A. fib  Polycythemia:  -Resolved   AKI:  Lab Results  Component Value Date   CREATININE 1.15 04/28/2020   CREATININE 1.34 (H) 04/27/2020    CREATININE 1.41 (H) 04/26/2020  -Resolved  Normal anion gap metabolic acidosis:  -Anion gap has resolved. -Patient remains slightly acidotic -   DVT prophylaxis: Full dose Lovenox Code Status: Full Family Communication:  Status is: Inpatient    Dispo: The patient is from: Home              Anticipated d/c is to: Home              Anticipated d/c date is:               Patient currently unstable      Consultants:  [facility]   Procedures/Significant Events:  8/18 ERCP  biliary stent exchange for persistent postop bile leak after recent cholecystectomy with abscess status post percutaneous drainage.    I have personally reviewed and interpreted all radiology studies and my findings are as above.  VENTILATOR SETTINGS: HHFNC 8/21 FiO2; 100% Flow rate; 30 L/min SPO2; 88%    Cultures 8/19 SARS coronavirus negative  Antimicrobials: Anti-infectives (From admission, onward)   Start     Ordered Stop   04/26/20 2300  piperacillin-tazobactam (ZOSYN) IVPB 3.375 g        04/26/20 1716     04/26/20 1700  piperacillin-tazobactam (ZOSYN) IVPB 3.375 g        04/26/20 1655 04/26/20 1844       Devices    LINES / TUBES:      Continuous Infusions: . amiodarone 30 mg/hr (04/28/20 1114)  . methocarbamol (ROBAXIN) IV    . piperacillin-tazobactam (ZOSYN)  IV 3.375 g (04/28/20 1325)     Objective: Vitals:   04/28/20 1618 04/28/20 1700 04/28/20 1703 04/28/20 1729  BP:  (!) 133/98  118/67  Pulse:  (!) 152  (!) 105  Resp:  (!) 34  (!) 30  Temp:  97.9 F (36.6 C)  97.8 F (36.6 C)  TempSrc:  Axillary  Oral  SpO2: 92% (!) 89% (!) 89% (!) 88%  Weight:      Height:        Intake/Output Summary (Last 24 hours) at 04/28/2020 1744 Last data filed at 04/28/2020 1700 Gross per 24 hour  Intake 3528.51 ml  Output 1525 ml  Net 2003.51 ml   Filed Weights   04/26/20 1208 04/27/20 1547  Weight: 81.6 kg 88.5 kg    Examination:  General: A/O x4, positive acute  respiratory distress Eyes: negative scleral hemorrhage, negative anisocoria, negative icterus ENT: Negative Runny nose, negative gingival bleeding, Neck:  Negative scars, masses, torticollis, lymphadenopathy, JVD Lungs: tachypneic, decreased breath sounds bibasilar without wheezes or crackles Cardiovascular: Irregular irregular rhythm and rate, without murmur gallop or rub normal S1 and S2 Abdomen: Tense, negative abdominal pain, positive distention,, positive soft, bowel sounds, no rebound, no ascites, no appreciable mass, cholecystostomy 2 RIGHT side draining brownish clear liquid Extremities: No significant cyanosis, clubbing, or edema bilateral lower extremities Skin: Negative rashes, lesions, ulcers Psychiatric:  Negative depression, negative anxiety, negative fatigue, negative mania  Central nervous system:  Cranial nerves II through XII intact, tongue/uvula midline, all extremities muscle strength 5/5, sensation intact throughout, negative dysarthria, negative expressive aphasia, negative receptive aphasia.  .     Data Reviewed: Care during the described time interval was provided by me .  I have  reviewed this patient's available data, including medical history, events of note, physical examination, and all test results as part of my evaluation.  CBC: Recent Labs  Lab 04/25/20 2034 04/26/20 1214 04/27/20 0900 04/28/20 0016  WBC 21.4* 41.3* 25.4* 18.0*  NEUTROABS  --   --  23.6*  --   HGB 17.7* 19.3* 16.8 14.7  HCT 53.3* 57.8* 51.9 46.5  MCV 85.8 86.3 91.1 90.3  PLT 226 260 161 825*   Basic Metabolic Panel: Recent Labs  Lab 04/25/20 2034 04/26/20 1214 04/27/20 0900 04/28/20 0016  NA 139 136 139 138  K 4.8 4.5 4.4 4.0  CL 100 103 108 111  CO2 23 16* 15* 15*  GLUCOSE 223* 191* 176* 159*  BUN 24* 36* 44* 36*  CREATININE 1.32* 1.41* 1.34* 1.15  CALCIUM 9.3 8.0* 6.3* 5.9*   GFR: Estimated Creatinine Clearance: 71.4 mL/min (by C-G formula based on SCr of 1.15  mg/dL). Liver Function Tests: Recent Labs  Lab 04/25/20 2034 04/26/20 1716 04/27/20 0900 04/28/20 0016  AST 51* 102* 88* 69*  ALT 43 48* 40 34  ALKPHOS 58 69 42 47  BILITOT 3.0* 3.9* 3.7* 3.2*  PROT 6.9 7.7 5.2* 4.9*  ALBUMIN 4.1 4.3 2.8* 2.4*   Recent Labs  Lab 04/25/20 2034 04/26/20 1716 04/27/20 0900 04/28/20 0016  LIPASE 1,431* 1,379* 540* 158*   No results for input(s): AMMONIA in the last 168 hours. Coagulation Profile: No results for input(s): INR, PROTIME in the last 168 hours. Cardiac Enzymes: No results for input(s): CKTOTAL, CKMB, CKMBINDEX, TROPONINI in the last 168 hours. BNP (last 3 results) No results for input(s): PROBNP in the last 8760 hours. HbA1C: No results for input(s): HGBA1C in the last 72 hours. CBG: No results for input(s): GLUCAP in the last 168 hours. Lipid Profile: No results for input(s): CHOL, HDL, LDLCALC, TRIG, CHOLHDL, LDLDIRECT in the last 72 hours. Thyroid Function Tests: No results for input(s): TSH, T4TOTAL, FREET4, T3FREE, THYROIDAB in the last 72 hours. Anemia Panel: No results for input(s): VITAMINB12, FOLATE, FERRITIN, TIBC, IRON, RETICCTPCT in the last 72 hours. Sepsis Labs: Recent Labs  Lab 04/26/20 2151 04/27/20 0900 04/28/20 0016 04/28/20 0804  PROCALCITON  --   --   --  4.82  LATICACIDVEN 4.6* 3.1* 2.4* 1.5    Recent Results (from the past 240 hour(s))  SARS CORONAVIRUS 2 (TAT 6-24 HRS) Nasopharyngeal Nasopharyngeal Swab     Status: None   Collection Time: 04/21/20  4:48 PM   Specimen: Nasopharyngeal Swab  Result Value Ref Range Status   SARS Coronavirus 2 NEGATIVE NEGATIVE Final    Comment: (NOTE) SARS-CoV-2 target nucleic acids are NOT DETECTED.  The SARS-CoV-2 RNA is generally detectable in upper and lower respiratory specimens during the acute phase of infection. Negative results do not preclude SARS-CoV-2 infection, do not rule out co-infections with other pathogens, and should not be used as the sole  basis for treatment or other patient management decisions. Negative results must be combined with clinical observations, patient history, and epidemiological information. The expected result is Negative.  Fact Sheet for Patients: SugarRoll.be  Fact Sheet for Healthcare Providers: https://www.-mathews.com/  This test is not yet approved or cleared by the [place] FDA and  has been authorized for detection and/or diagnosis of SARS-CoV-2 by FDA under an Emergency Use Authorization (EUA). This EUA will remain  in effect (meaning this test can be used) for the duration of the COVID-19 declaration under Se ction 564(b)(1) of the Act, 21 U.S.C.  section 360bbb-3(b)(1), unless the authorization is terminated or revoked sooner.  Performed at [facility], [place] [address] [place], [place] [address]   Blood culture (routine x 2)     Status: None (Preliminary result)   Collection Time: 04/26/20  5:16 PM   Specimen: BLOOD  Result Value Ref Range Status   Specimen Description BLOOD LEFT ANTECUBITAL  Final   Special Requests   Final    BOTTLES DRAWN AEROBIC AND ANAEROBIC Blood Culture adequate volume   Culture   Final    NO GROWTH 2 DAYS Performed at [facility], [place] [address] [place], [place] [address]    Report Status PENDING  Incomplete  Blood culture (routine x 2)     Status: None (Preliminary result)   Collection Time: 04/26/20  5:23 PM   Specimen: BLOOD  Result Value Ref Range Status   Specimen Description BLOOD RIGHT ANTECUBITAL  Final   Special Requests   Final    BOTTLES DRAWN AEROBIC AND ANAEROBIC Blood Culture results may not be optimal due to an inadequate volume of blood received in culture bottles   Culture   Final    NO GROWTH 2 DAYS Performed at [facility], [place] [address] [place], [place] [address]    Report Status PENDING  Incomplete  SARS Coronavirus 2 by RT PCR (hospital order, performed in [facility] hospital lab) Nasopharyngeal Nasopharyngeal Swab     Status: None   Collection Time: 04/26/20  9:50 PM   Specimen: Nasopharyngeal Swab  Result Value Ref Range Status   SARS Coronavirus 2 NEGATIVE NEGATIVE Final    Comment: (NOTE) SARS-CoV-2 target nucleic acids are NOT DETECTED.  The SARS-CoV-2 RNA is generally detectable in upper and lower respiratory specimens during the acute phase of infection. The lowest concentration of SARS-CoV-2 viral copies this assay can detect is 250 copies / mL. A negative result does not preclude SARS-CoV-2 infection and should not be used as the sole basis for treatment or other patient management decisions.  A negative result may occur with improper specimen collection / handling, submission of specimen other than nasopharyngeal swab, presence of viral mutation(s) within the areas targeted by this assay, and inadequate number of viral copies (<250 copies / mL). A negative result must be combined with clinical observations, patient history, and epidemiological information.  Fact Sheet for Patients:   StrictlyIdeas.no  Fact Sheet for Healthcare Providers: BankingDealers.co.za  This test is not yet approved or  cleared by the [place] FDA and has been authorized for detection and/or diagnosis of SARS-CoV-2 by FDA under an Emergency Use Authorization (EUA).  This EUA will remain in effect (meaning this test can be used) for the duration of the COVID-19 declaration under Section 564(b)(1) of the Act, 21 U.S.C. section 360bbb-3(b)(1), unless the authorization is terminated or revoked sooner.  Performed at [facility], [place] [address], [place], [place] [address]          Radiology Studies: CT Abdomen Pelvis Wo Contrast  Result Date: 04/26/2020 CLINICAL DATA:  64 year old male with abdominal pain. History of cholecystectomy. EXAM: CT ABDOMEN AND PELVIS WITHOUT CONTRAST TECHNIQUE:  Multidetector CT imaging of the abdomen and pelvis was performed following the standard protocol without IV contrast. COMPARISON:  CT abdomen pelvis dated 03/01/2020. FINDINGS: Evaluation of this exam is limited in the absence of intravenous contrast. Lower chest: There are small bilateral pleural effusions and bibasilar patchy and streaky densities, likely atelectasis. Pneumonia is not  excluded. Clinical correlation is recommended. No intra-abdominal free air. There is diffuse mesenteric stranding and edema as well as small amount of retroperitoneal fluid extending inferiorly along the paracolic gutters into the pelvis. Hepatobiliary: Mild fatty liver. A percutaneous drain is noted with pigtail tip in similar position. Ill-defined small fluid adjacent to the tip of the drain similar to prior CT. Interval placement of a common bile duct stent with associated pneumobilia. Pancreas: There is interval removal of the pancreatic duct stent. There is diffuse inflammatory changes and stranding of the peripancreatic fat consistent with acute pancreatitis. No drainable fluid collection or abscess. Spleen: Normal in size without focal abnormality. Adrenals/Urinary Tract: The adrenal glands unremarkable. There are multiple nonobstructing bilateral renal calculi measure up to 5 mm in the upper pole of the left kidney. There is no hydronephrosis on either side. The visualized ureters and urinary bladder appear unremarkable. Stomach/Bowel: There is no bowel obstruction or active inflammation. There is sigmoid diverticulosis without active inflammatory changes. Small scattered colonic diverticula. The appendix is normal. Vascular/Lymphatic: Mild aortoiliac atherosclerotic disease. The IVC is unremarkable. No portal venous gas. There is no adenopathy. Reproductive: The prostate and seminal vesicles are Other: None Musculoskeletal: No acute or significant osseous findings. IMPRESSION: 1. Acute pancreatitis. No drainable fluid  collection or abscess. 2. Interval placement of a common bile duct stent with associated pneumobilia. 3. Percutaneous drain with pigtail tip in similar position. Ill-defined small fluid adjacent to the tip of the drain similar to prior CT. 4. Small bilateral pleural effusions and bibasilar patchy and streaky densities, likely atelectasis. Pneumonia is not excluded. Clinical correlation is recommended. 5. Colonic diverticulosis. No bowel obstruction. Normal appendix. 6. Nonobstructing bilateral renal calculi. No hydronephrosis. 7. Aortic Atherosclerosis (ICD10-I70.0). Electronically Signed   By: Anner Crete M.D.   On: 04/26/2020 21:04   CT ABDOMEN PELVIS W CONTRAST  Result Date: 04/28/2020 CLINICAL DATA:  Acute, nonlocalized abdominal pain EXAM: CT ABDOMEN AND PELVIS WITH CONTRAST TECHNIQUE: Multidetector CT imaging of the abdomen and pelvis was performed using the standard protocol following bolus administration of intravenous contrast. CONTRAST:  167m OMNIPAQUE IOHEXOL 300 MG/ML  SOLN COMPARISON:  04/26/2020 FINDINGS: Lower chest: Moderate bilateral pleural effusions are present with subtotal collapse of the lower lobes bilaterally. These are new since prior examination and bibasilar atelectasis has progressed. Visualized heart and pericardium are unremarkable. Hepatobiliary: Percutaneous cholecystostomy is unchanged with its retaining loop formed within the decompressed gallbladder lumen. Metallic internal biliary stent is seen within the extrahepatic bile duct and there is mild pneumobilia again identified suggesting patency of the stented segment. The liver is otherwise unremarkable. Pancreas: There is extensive peripancreatic inflammatory stranding again identified, similar to that noted on prior examination in keeping with changes of acute pancreatitis. 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 The pancreatic duct is not dilated. No loculated peripancreatic fluid collections are identified. No intraparenchymal calcifications are seen. Spleen: Unremarkable.  The splenic vein is patent. Adrenals/Urinary Tract: The adrenal glands are unremarkable. A few scattered nonobstructing calculi are seen within the kidneys bilaterally measuring up to 3 mm. No ureteral calculi. No hydronephrosis. No enhancing intrarenal masses. The bladder is unremarkable. Stomach/Bowel: Mild sigmoid diverticulosis. Scattered diverticular seen within the transverse and descending colon. The large bowel is otherwise unremarkable. Prominent loops of gas and fluid-filled small bowel are seen throughout the abdomen suggesting a  mild adynamic ileus. The stomach is unremarkable. The appendix is unremarkable. There is mild ascites present, stable in volume since prior examination. No free intraperitoneal gas. Vascular/Lymphatic: The abdominal vasculature is age-appropriate with minimal atherosclerotic calcification. No aneurysm. No pathologic adenopathy within the abdomen and pelvis. Shotty mesenteric adenopathy is likely reactive in nature. Reproductive: Prostate is unremarkable. Other: Rectum unremarkable. Musculoskeletal: No lytic or blastic bone lesion. IMPRESSION: 1. Findings in keeping with acute pancreatitis. 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 No loculated peripancreatic fluid collections. 2. Moderate bilateral pleural effusions with subtotal collapse of the lower lobes bilaterally. These are new since prior examination. 3. Metallic internal biliary stent unchanged, with stable pneumobilia suggesting patency. Stable percutaneous cholecystostomy with decompression of the gallbladder  lumen. 4. Prominent loops of gas and fluid-filled small bowel throughout the abdomen suggesting a mild adynamic ileus. Electronically Signed   By: Fidela Salisbury MD   On: 04/28/2020 03:06   DG CHEST PORT 1 VIEW  Result Date: 04/28/2020 CLINICAL DATA:  Shortness of breath EXAM: PORTABLE CHEST 1 VIEW COMPARISON:  04/27/2020 and prior radiographs FINDINGS: This is a low volume study. Cardiomediastinal silhouette is unchanged. Bilateral pleural effusions and bibasilar atelectasis noted. Mild interstitial prominence is noted which may represent interstitial edema. No pneumothorax. IMPRESSION: Bilateral pleural effusions with bibasilar atelectasis and possible mild interstitial pulmonary edema. Electronically Signed   By: Margarette Canada M.D.   On: 04/28/2020 15:10   DG CHEST PORT 1 VIEW  Result Date: 04/27/2020 CLINICAL DATA:  Hypoxemia, chest and abdomen pain EXAM: PORTABLE CHEST 1 VIEW COMPARISON:  04/26/2020 FINDINGS: Single frontal view of the chest demonstrates an unremarkable cardiac silhouette. Lung volumes are diminished, with bibasilar consolidation left greater than right. Small left effusion cannot be excluded. No evidence pneumothorax. No acute bony abnormalities. IMPRESSION: 1. Low lung volumes, with bibasilar consolidation. Findings could reflect atelectasis or basilar pneumonia. 2. Trace left pleural effusion. Electronically Signed   By: Randa Ngo M.D.   On: 04/27/2020 17:20        Scheduled Meds: . enoxaparin (LOVENOX) injection  90 mg Subcutaneous Q12H  . [START ON 04/29/2020] furosemide  60 mg Intravenous TID  . tamsulosin  0.4 mg Oral BID   Continuous Infusions: . amiodarone 30 mg/hr (04/28/20 1114)  . methocarbamol (ROBAXIN) IV    . piperacillin-tazobactam (ZOSYN)  IV 3.375 g (04/28/20 1325)     LOS: 1 day    Time spent:40 min    Jahking Lesser, Geraldo Docker, MD [facility] Pager [phone]  If 7PM-7AM, please contact night-coverage [email or web address] Password  [facility] 04/28/2020, 5:44 PM

## 2020-04-28 NOTE — Progress Notes (Signed)
Pt received 80mg  IV lasix. Foley catheter inserted. Pt placed on 30L heated HFNC with FiO2 100%. Pt O2 saturation 88%. New order received to transfer pt to higher acuity of care. Will continue to monitor pt.   Arletta Bale, RN

## 2020-04-28 NOTE — Progress Notes (Signed)
Page out to Dr Tonie Griffith re: patient c/o abdominal pain with taut distention noted around to back, resp rate 26-35 and increase in heart rate to 120-140 with movement. Return call received from MD. New orders received for CT abdomen, repeat lactic acid and EKG.

## 2020-04-28 NOTE — Progress Notes (Signed)
Rapid response called to bedside. Pt anxious and sitting on the side of the bed, heart rate A fib in 150-160s.  Respiratory rate elevated in 30-40s, O2 saturation 89% on 15L NRB.Adam Fowler Pt stating he "feels like he is running a marathon and cannot catch his breath." Pt A&O x4. MD notified. New orders obtained.   Arletta Bale, RN

## 2020-04-28 NOTE — Progress Notes (Signed)
RT note- ABG obtained on PRB at the time, both flaps were off of the NRB, patient is very anxious, a new NRB mask was placed on patient, sp02 93%. Continue to monitor.

## 2020-04-28 NOTE — Progress Notes (Addendum)
Progress Note   Subjective  Patient developed atrial fibrillation with rapid ventricular response and has been transferred to 4 E.  Amiodarone drip after previously receiving diltiazem. He reports overall his abdominal pain has improved, he is having moderate mid back pain Urine output he reports has improved He feels very short of breath with any movement   Objective  Vital signs in last 24 hours: Temp:  [97.5 F (36.4 C)-98.7 F (37.1 C)] 97.5 F (36.4 C) (08/21 1125) Pulse Rate:  [114-175] 140 (08/21 1125) Resp:  [21-28] 24 (08/21 1125) BP: (101-132)/(67-92) 132/81 (08/21 1125) SpO2:  [85 %-97 %] 94 % (08/21 1125) Weight:  [88.5 kg] 88.5 kg (08/20 1547) Last BM Date: 04/25/20  General: Alert, well-developed, obviously dyspneic wearing a nonrebreather Heart: Tachycardic and irregular Chest: Slightly decreased breath sounds bilateral bases Abdomen:  Soft, tender diffusely but less so than yesterday, nondistended, present bowel sounds    Extremities:  Without edema. Neurologic:  Alert and  oriented x4; grossly normal neurologically. Psych:  Alert and cooperative. Normal mood and affect.  Intake/Output from previous day: 08/20 0701 - 08/21 0700 In: 0  Out: 1200 [Urine:1200] Intake/Output this shift: Total I/O In: -  Out: 375 [Urine:375]  Lab Results: Recent Labs    04/26/20 1214 04/27/20 0900 04/28/20 0016  WBC 41.3* 25.4* 18.0*  HGB 19.3* 16.8 14.7  HCT 57.8* 51.9 46.5  PLT 260 161 136*   BMET Recent Labs    04/26/20 1214 04/27/20 0900 04/28/20 0016  NA 136 139 138  K 4.5 4.4 4.0  CL 103 108 111  CO2 16* 15* 15*  GLUCOSE 191* 176* 159*  BUN 36* 44* 36*  CREATININE 1.41* 1.34* 1.15  CALCIUM 8.0* 6.3* 5.9*   LFT Recent Labs    04/26/20 1716 04/27/20 0900 04/28/20 0016  PROT 7.7   < > 4.9*  ALBUMIN 4.3   < > 2.4*  AST 102*   < > 69*  ALT 48*   < > 34  ALKPHOS 69   < > 47  BILITOT 3.9*   < > 3.2*  BILIDIR 0.6*  --   --   IBILI 3.3*  --    --    < > = values in this interval not displayed.   PT/INR No results for input(s): LABPROT, INR in the last 72 hours. Hepatitis Panel No results for input(s): HEPBSAG, HCVAB, HEPAIGM, HEPBIGM in the last 72 hours.  Studies/Results: CT Abdomen Pelvis Wo Contrast  Result Date: 04/26/2020 CLINICAL DATA:  64 year old male with abdominal pain. History of cholecystectomy. EXAM: CT ABDOMEN AND PELVIS WITHOUT CONTRAST TECHNIQUE: Multidetector CT imaging of the abdomen and pelvis was performed following the standard protocol without IV contrast. COMPARISON:  CT abdomen pelvis dated 03/01/2020. FINDINGS: Evaluation of this exam is limited in the absence of intravenous contrast. Lower chest: There are small bilateral pleural effusions and bibasilar patchy and streaky densities, likely atelectasis. Pneumonia is not excluded. Clinical correlation is recommended. No intra-abdominal free air. There is diffuse mesenteric stranding and edema as well as small amount of retroperitoneal fluid extending inferiorly along the paracolic gutters into the pelvis. Hepatobiliary: Mild fatty liver. A percutaneous drain is noted with pigtail tip in similar position. Ill-defined small fluid adjacent to the tip of the drain similar to prior CT. Interval placement of a common bile duct stent with associated pneumobilia. Pancreas: There is interval removal of the pancreatic duct stent. There is diffuse inflammatory changes and stranding of the peripancreatic fat  consistent with acute pancreatitis. No drainable fluid collection or abscess. Spleen: Normal in size without focal abnormality. Adrenals/Urinary Tract: The adrenal glands unremarkable. There are multiple nonobstructing bilateral renal calculi measure up to 5 mm in the upper pole of the left kidney. There is no hydronephrosis on either side. The visualized ureters and urinary bladder appear unremarkable. Stomach/Bowel: There is no bowel obstruction or active inflammation. There  is sigmoid diverticulosis without active inflammatory changes. Small scattered colonic diverticula. The appendix is normal. Vascular/Lymphatic: Mild aortoiliac atherosclerotic disease. The IVC is unremarkable. No portal venous gas. There is no adenopathy. Reproductive: The prostate and seminal vesicles are Other: None Musculoskeletal: No acute or significant osseous findings. IMPRESSION: 1. Acute pancreatitis. No drainable fluid collection or abscess. 2. Interval placement of a common bile duct stent with associated pneumobilia. 3. Percutaneous drain with pigtail tip in similar position. Ill-defined small fluid adjacent to the tip of the drain similar to prior CT. 4. Small bilateral pleural effusions and bibasilar patchy and streaky densities, likely atelectasis. Pneumonia is not excluded. Clinical correlation is recommended. 5. Colonic diverticulosis. No bowel obstruction. Normal appendix. 6. Nonobstructing bilateral renal calculi. No hydronephrosis. 7. Aortic Atherosclerosis (ICD10-I70.0). Electronically Signed   By: Anner Crete M.D.   On: 04/26/2020 21:04   CT ABDOMEN PELVIS W CONTRAST  Result Date: 04/28/2020 CLINICAL DATA:  Acute, nonlocalized abdominal pain EXAM: CT ABDOMEN AND PELVIS WITH CONTRAST TECHNIQUE: Multidetector CT imaging of the abdomen and pelvis was performed using the standard protocol following bolus administration of intravenous contrast. CONTRAST:  122mL OMNIPAQUE IOHEXOL 300 MG/ML  SOLN COMPARISON:  04/26/2020 FINDINGS: Lower chest: Moderate bilateral pleural effusions are present with subtotal collapse of the lower lobes bilaterally. These are new since prior examination and bibasilar atelectasis has progressed. Visualized heart and pericardium are unremarkable. Hepatobiliary: Percutaneous cholecystostomy is unchanged with its retaining loop formed within the decompressed gallbladder lumen. Metallic internal biliary stent is seen within the extrahepatic bile duct and there is mild  pneumobilia again identified suggesting patency of the stented segment. The liver is otherwise unremarkable. Pancreas: There is extensive peripancreatic inflammatory stranding again identified, similar to that noted on prior examination in keeping with changes of acute pancreatitis. 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 The pancreatic duct is not dilated. No loculated peripancreatic fluid collections are identified. No intraparenchymal calcifications are seen. Spleen: Unremarkable.  The splenic vein is patent. Adrenals/Urinary Tract: The adrenal glands are unremarkable. A few scattered nonobstructing calculi are seen within the kidneys bilaterally measuring up to 3 mm. No ureteral calculi. No hydronephrosis. No enhancing intrarenal masses. The bladder is unremarkable. Stomach/Bowel: Mild sigmoid diverticulosis. Scattered diverticular seen within the transverse and descending colon. The large bowel is otherwise unremarkable. Prominent loops of gas and fluid-filled small bowel are seen throughout the abdomen suggesting a mild adynamic ileus. The stomach is unremarkable. The appendix is unremarkable. There is mild ascites present, stable in volume since prior examination. No free intraperitoneal gas. Vascular/Lymphatic: The abdominal vasculature is age-appropriate with minimal atherosclerotic calcification. No aneurysm. No pathologic adenopathy within the abdomen and pelvis. Shotty mesenteric adenopathy is likely reactive in nature. Reproductive: Prostate is unremarkable. Other: Rectum unremarkable. Musculoskeletal: No lytic or blastic bone lesion. IMPRESSION: 1. Findings in keeping with acute pancreatitis. 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 No loculated peripancreatic fluid collections. 2. Moderate bilateral pleural effusions with subtotal collapse of the lower lobes bilaterally. These are new since prior examination. 3. Metallic internal biliary stent unchanged, with stable pneumobilia suggesting patency. Stable percutaneous cholecystostomy with decompression of the gallbladder lumen. 4. Prominent loops of gas and fluid-filled small bowel throughout the abdomen suggesting a mild adynamic ileus. Electronically Signed   By: Fidela Salisbury MD   On: 04/28/2020 03:06   DG CHEST PORT 1 VIEW  Result Date: 04/27/2020 CLINICAL DATA:  Hypoxemia, chest and abdomen pain EXAM: PORTABLE CHEST 1 VIEW COMPARISON:  04/26/2020 FINDINGS: Single frontal view of the chest demonstrates an unremarkable cardiac silhouette. Lung volumes are diminished, with bibasilar consolidation left greater than right. Small left effusion cannot be excluded. No evidence pneumothorax. No acute bony abnormalities. IMPRESSION: 1. Low lung volumes, with bibasilar consolidation. Findings could reflect atelectasis or basilar pneumonia. 2. Trace left pleural effusion. Electronically Signed   By: Randa Ngo M.D.   On: 04/27/2020 17:20      Assessment & Recommendations  64 year old male with history of cholecystectomy complicated by bile leak status post ERCP on 2020-04-25 with biliary stent exchange for persistent bile leak admitted with post ERCP pancreatitis now complicated by A. fib with RVR  1. Post-ERCP pancreatitis --he has developed moderate pancreatitis after ERCP.  Abdominal pain has improved today but abdomen remains tender.  Creatinine has improved slightly.  Bilirubin slightly better at 3.2 from 3.7, previously 3.9.  Alkaline phosphatase is normal.  AST and ALT, 69 and 34, respectively.  Calcium remains low.  Leukocytosis has improved to 18K from 25K. --Continue IV hydration, see  #2 --Okay to begin clear liquids --Continue pain control with IV narcotics for now --Continue empiric Zosyn  2. Afib with RVR --new for him and likely result of pancreatitis.  Rate is still not controlled and he is quite dyspneic.  He is on an amiodarone drip and Dr. Sherral Hammers is ordering metoprolol.  Reasonable for cardiology to weigh in if he fails to convert or achieve rate control. --Amiodarone infusion; metoprolol per primary team --Chest x-ray to ensure no pulmonary edema, though dyspnea may be from rapid ventricular response --Cardiology consult if felt needed by primary team  We will follow  Addendum:  I discussed all the above with the patient's wife by phone.  Time provided for questions and answers and she thanked me for the call     LOS: 1 day   Adam Fowler  04/28/2020, 2:51 PM

## 2020-04-28 NOTE — Progress Notes (Signed)
Called by RN.  Patient stood up at the side of his bed and developed tachycardia.  RN found him to have a heart rate of 180 and telemetry monitor showing atrial fibrillation. EKG was obtained which confirmed atrial fibrillation with RVR Patient will be started on Cardizem with 50 mg bolus given now and Cardizem infusion started.  Will transfer to cardiac telemetry.  Check serial troponin Coagulation status to full therapeutic anticoagulation with A. fib

## 2020-04-28 NOTE — Consult Note (Addendum)
NAME:  Cordarrius Coad, MRN:  [id number], DOB:  10-02-1955, LOS: 1 ADMISSION DATE:  04/26/2020, CONSULTATION DATE:  04/29/20 REFERRING MD:  Hospitalist, CHIEF COMPLAINT:  Hypoxic respiratory failure   Brief History   64 year old male with history of cholecystectomy and abscess post PERC drain who presented with acute pancreatitis on 8/20.  He was admitted and treated with fluids, Zosyn, and pain medication.  On 8/21 he began developing worsening hypoxic respiratory failure was transferred to intensive care where he became confused with increasing oxygen requirement.  PCCM consulted  History of present illness   64 year old male with past medical history significant for hypertension, ERCP on 8/18 with biliary stent exchange for persistent bile leak and recent cholecystectomy with abscess post PERC drain presented with acute pancreatitis.  He was admitted on 8/28 and treated with IV fluids, Zosyn and pain medications.  On 8/21 he developed atrial fibrillation with RVR and was started on amiodarone and full dose Lovenox, then subsequently developed worsening hypoxic respiratory failure and was transferred to the ICU.  He was intubated secondary to worsening mental status and hypoxia.  Chest x-ray with bilateral airspace opacities and effusions.   Past Medical History   has a past medical history of History of kidney stones, Hypertension, and Insomnia.   Significant Hospital Events   8/20 admit to hospitalists 8/22 Txfr to ICU and intubated  Consults:  PCCM  Procedures:  8/22 ETT  Significant Diagnostic Tests:  8/21 CT abdomen/pelvis>>Findings in keeping with acute pancreatitis. 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 No loculated peripancreatic fluid collections.  Moderate bilateral pleural effusions with subtotal collapse of the  lower lobes bilaterally 8/22 CXR>>Bilateral airspace opacities, which may indicate multifocal infection or pulmonary edema. 8/22 CTA chest>>  Micro Data:  8/19 blood cultures x2>> 8/19 Covid-19>> negative   Antimicrobials:  Cipro 8/18 only Zosyn 8/19-  Interim history/subjective:  Patient intubated and sedated  Objective   Blood pressure (!) 146/87, pulse 95, temperature 98.3 F (36.8 C), temperature source Oral, resp. rate (!) 36, height 5\' 9"  (1.753 m), weight 88.5 kg, SpO2 93 %.    FiO2 (%):  [60 %-100 %] 100 %   Intake/Output Summary (Last 24 hours) at 04/28/2020 2349 Last data filed at 04/28/2020 1831 Gross per 24 hour  Intake 3528.51 ml  Output 2250 ml  Net 1278.51 ml   Filed Weights   04/26/20 1208 04/27/20 1547  Weight: 81.6 kg 88.5 kg    General: Well nourished male in respiratory distress HEENT: MM pink/moist Neuro: Awake, oriented to place only CV: s1s2 RRR, no m/r/g PULM: Diminished in bilateral bases GI: soft, bsx4 active  Extremities: warm/dry, no edema  Skin: no rashes or lesions   Resolved Hospital Problem list     Assessment & Plan:   Acute hypoxic respiratory failure Secondary to volume overload versus developing ARDS/infiltrate -Obtain CTA chest to rule out PE -Continue Zosyn, if significant infiltrates on CT chest broadened to Vanco and cefepime -Received Lasix earlier today, continue to monitor urine output -Maintain full vent support with SAT/SBT as tolerated -titrate Vent setting to maintain SpO2 greater than or equal to 90%. -HOB elevated 30 degrees. -Plateau pressures less than 30 cm H20.  -Follow chest x-ray, ABG prn.   -Driving pressure <78 -Bronchial hygiene and RT/bronchodilator protocol.  New onset atrial fibrillation with RVR -Heart rate 90s on amiodarone gtt., check mag  and TSH -Echocardiogram -Continue full dose Lovenox   Hypokalemia, hypo-calcemia -Replete with 40 meq's K, 2g Mag and 2 g calcium gluconate and repeat  labs in the a.m.   Best practice:  Diet: N.p.o. Pain/Anxiety/Delirium protocol (if indicated): Propofol/fentanyl VAP protocol (if indicated): HOB 30 degrees, suction as needed DVT prophylaxis: Lovenox GI prophylaxis: Protonix Glucose control: SSI Mobility: Bedrest Code Status: Full code Family Communication: Wife updated Disposition: ICU  Labs   CBC: Recent Labs  Lab 04/25/20 2034 04/26/20 1214 04/27/20 0900 04/28/20 0016 04/28/20 2334  WBC 21.4* 41.3* 25.4* 18.0*  --   NEUTROABS  --   --  23.6*  --   --   HGB 17.7* 19.3* 16.8 14.7 15.6  HCT 53.3* 57.8* 51.9 46.5 46.0  MCV 85.8 86.3 91.1 90.3  --   PLT 226 260 161 136*  --     Basic Metabolic Panel: Recent Labs  Lab 04/25/20 2034 04/26/20 1214 04/27/20 0900 04/28/20 0016 04/28/20 2334  NA 139 136 139 138 142  K 4.8 4.5 4.4 4.0 2.8*  CL 100 103 108 111  --   CO2 23 16* 15* 15*  --   GLUCOSE 223* 191* 176* 159*  --   BUN 24* 36* 44* 36*  --   CREATININE 1.32* 1.41* 1.34* 1.15  --   CALCIUM 9.3 8.0* 6.3* 5.9*  --    GFR: Estimated Creatinine Clearance: 71.4 mL/min (by C-G formula based on SCr of 1.15 mg/dL). Recent Labs  Lab 04/25/20 2034 04/26/20 1214 04/26/20 1715 04/26/20 2151 04/27/20 0900 04/28/20 0016 04/28/20 0804  PROCALCITON  --   --   --   --   --   --  4.82  WBC 21.4* 41.3*  --   --  25.4* 18.0*  --   LATICACIDVEN  --   --    < > 4.6* 3.1* 2.4* 1.5   < > = values in this interval not displayed.    Liver Function Tests: Recent Labs  Lab 04/25/20 2034 04/26/20 1716 04/27/20 0900 04/28/20 0016  AST 51* 102* 88* 69*  ALT 43 48* 40 34  ALKPHOS 58 69 42 47  BILITOT 3.0* 3.9* 3.7* 3.2*  PROT 6.9 7.7 5.2* 4.9*  ALBUMIN 4.1 4.3 2.8* 2.4*   Recent Labs  Lab 04/25/20 2034 04/26/20 1716 04/27/20 0900 04/28/20 0016  LIPASE 1,431* 1,379* 540* 158*   No results for input(s): AMMONIA in the last 168 hours.  ABG    Component Value Date/Time   PHART 7.345 (L) 04/28/2020 2334    PCO2ART 34.5 04/28/2020 2334   PO2ART 46 (L) 04/28/2020 2334   HCO3 18.9 (L) 04/28/2020 2334   TCO2 20 (L) 04/28/2020 2334   ACIDBASEDEF 6.0 (H) 04/28/2020 2334   O2SAT 80.0 04/28/2020 2334     Coagulation Profile: No results for input(s): INR, PROTIME in the last 168 hours.  Cardiac Enzymes: No results for input(s): CKTOTAL, CKMB, CKMBINDEX, TROPONINI in the last 168 hours.  HbA1C: Hgb A1c MFr Bld  Date/Time Value Ref Range Status  02/13/2020 04:55 PM 6.0 (H) 4.8 - 5.6 % Final    Comment:    (NOTE) Pre diabetes:          5.7%-6.4% Diabetes:              >6.4% Glycemic control for   <7.0% adults with diabetes     CBG: Recent Labs  Lab 04/28/20 2007  GLUCAP 165*    Review of Systems:   Unable to  obtain secondary to mental status  Past Medical History  He,  has a past medical history of History of kidney stones, Hypertension, and Insomnia.   Surgical History    Past Surgical History:  Procedure Laterality Date  . ARTHROSCOPIC REPAIR ACL Bilateral    left, 2012; right 2004  . BILIARY STENT PLACEMENT  01/23/2020   Procedure: BILIARY STENT PLACEMENT;  Surgeon: Rush Landmark Telford Nab., MD;  Location: [place];  Service: Gastroenterology;;  . BILIARY STENT PLACEMENT  03/21/2020   Procedure: BILIARY STENT PLACEMENT;  Surgeon: Irving Copas., MD;  Location: [place];  Service: Gastroenterology;;  . BILIARY STENT PLACEMENT  04/25/2020   Procedure: BILIARY STENT PLACEMENT;  Surgeon: Irving Copas., MD;  Location: [place];  Service: Gastroenterology;;  . CHOLECYSTECTOMY N/A 01/24/2020   Procedure: LAPAROSCOPIC CHOLECYSTECTOMY;  Surgeon: Jesusita Oka, MD;  Location: [place];  Service: General;  Laterality: N/A;  . COLONOSCOPY    . ENDOSCOPIC RETROGRADE CHOLANGIOPANCREATOGRAPHY (ERCP) WITH PROPOFOL N/A 03/21/2020   Procedure: ENDOSCOPIC RETROGRADE CHOLANGIOPANCREATOGRAPHY (ERCP) WITH PROPOFOL;  Surgeon: Rush Landmark Telford Nab., MD;  Location: [place];  Service: Gastroenterology;  Laterality: N/A;  . ENDOSCOPIC RETROGRADE CHOLANGIOPANCREATOGRAPHY (ERCP) WITH PROPOFOL N/A 04/25/2020   Procedure: ENDOSCOPIC RETROGRADE CHOLANGIOPANCREATOGRAPHY (ERCP) WITH PROPOFOL;  Surgeon: Rush Landmark Telford Nab., MD;  Location: [place];  Service: Gastroenterology;  Laterality: N/A;  . ERCP N/A 01/21/2020   Procedure: ENDOSCOPIC RETROGRADE CHOLANGIOPANCREATOGRAPHY (ERCP);  Surgeon: Ladene Artist, MD;  Location: [facility] ENDOSCOPY;  Service: Endoscopy;  Laterality: N/A;  . ERCP N/A 01/23/2020   Procedure: ENDOSCOPIC RETROGRADE CHOLANGIOPANCREATOGRAPHY (ERCP);  Surgeon: Irving Copas., MD;  Location: [place];  Service: Gastroenterology;  Laterality: N/A;  . IR RADIOLOGIST EVAL & MGMT  03/01/2020  . IR RADIOLOGIST EVAL & MGMT  04/10/2020  . PANCREATIC STENT PLACEMENT  01/21/2020   Procedure: PANCREATIC STENT PLACEMENT;  Surgeon: Ladene Artist, MD;  Location: [place];  Service: Endoscopy;;  . PANCREATIC STENT PLACEMENT  01/23/2020   Procedure: PANCREATIC STENT PLACEMENT;  Surgeon: Irving Copas., MD;  Location: [place];  Service: Gastroenterology;;  . [name]  01/23/2020   Procedure: [name];  Surgeon: Irving Copas., MD;  Location: [place];  Service: Gastroenterology;;  . Lavell Islam REMOVAL  03/21/2020   Procedure: STENT REMOVAL;  Surgeon: Irving Copas., MD;  Location: [place];  Service: Gastroenterology;;  . Lavell Islam REMOVAL  04/25/2020   Procedure: STENT REMOVAL;  Surgeon: Irving Copas., MD;  Location: [place];  Service: Gastroenterology;;     Social History   reports that he has never smoked. He has never used smokeless tobacco. He reports previous alcohol use. He reports that he does not use drugs.   Family History   His family history is negative for Colon cancer and Stomach cancer.   Allergies No Known Allergies   Home Medications  Prior to Admission medications    Medication Sig Start Date End Date Taking? Authorizing Provider  Calcium Carb-Cholecalciferol (CALCIUM+D3 PO) Take 1 tablet by mouth daily.   Yes [provider]  Coenzyme Q10 (COQ10) 400 MG CAPS Take 800 mg by mouth in the morning.    Yes [provider]  Cyanocobalamin (VITAMIN B-12) 5000 MCG TBDP Take 5,000 mcg by mouth daily.   Yes [provider]  GLUCOSAMINE-CHONDROITIN PO Take 1 tablet by mouth in the morning and at bedtime.    Yes [provider]  Javier Docker Oil 1000 MG CAPS Take 1,000 mg by mouth in the morning and at  bedtime.    Yes [provider]  lisinopril-hydrochlorothiazide (ZESTORETIC) 10-12.5 MG tablet Take 1 tablet by mouth daily. 03/04/20  Yes [provider]  Moringa Oleifera (MORINGA PO) Take 1,000 mg by mouth 2 (two) times daily.   Yes [provider]  Multiple Vitamin (MULTIVITAMIN WITH MINERALS) TABS tablet Take 1 tablet by mouth daily.   Yes [provider]  ondansetron (ZOFRAN) 4 MG tablet Take 1 tablet (4 mg total) by mouth every 8 (eight) hours as needed for nausea or vomiting. 04/25/20  Yes Mansouraty, Telford Nab., MD  OVER THE COUNTER MEDICATION Take 1 tablet by mouth See admin instructions. [facility] otc tablet- Take 1 tablet by mouth once a day   Yes [provider]  oxyCODONE (OXY IR/ROXICODONE) 5 MG immediate release tablet Take 5 mg by mouth 2 (two) times daily as needed (for pain).  04/25/20  Yes [provider]  pravastatin (PRAVACHOL) 80 MG tablet Take 40 mg by mouth in the morning and at bedtime.   Yes [provider]  Red Yeast Rice 600 MG CAPS Take 1,200 mg by mouth at bedtime.    Yes [provider]  sertraline (ZOLOFT) 100 MG tablet Take 100 mg by mouth daily. 11/08/19  Yes [provider]  TURMERIC PO Take 1,500 mg by mouth 2 (two) times daily.   Yes [provider]  zolpidem (AMBIEN) 5 MG tablet Take 5 mg by mouth at bedtime. 12/29/19   Yes [provider]  aspirin EC 81 MG tablet Take 81 mg by mouth 2 (two) times daily. Patient not taking: Reported on 04/26/2020    [provider]  Calcium Carb-Cholecalciferol (CALCIUM 1000 + D PO) Take 1 tablet by mouth daily. Patient not taking: Reported on 04/26/2020    [provider]  ibuprofen (ADVIL) 200 MG tablet Take 400-600 mg by mouth every 6 (six) hours as needed for headache or moderate pain.    [provider]  oxycodone (OXY-IR) 5 MG capsule Take 2 capsules (10 mg total) by mouth every 4 (four) hours as needed for up to 3 days for pain. Patient not taking: Reported on 04/26/2020 04/25/20 04/28/20  Mansouraty, Telford Nab., MD     Critical care time: 60 minutes     CRITICAL CARE Performed by: Otilio Carpen Glorie Dowlen   Total critical care time: 60 minutes  Critical care time was exclusive of separately billable procedures and treating other patients.  Critical care was necessary to treat or prevent imminent or life-threatening deterioration.  Critical care was time spent personally by me on the following activities: development of treatment plan with patient and/or surrogate as well as nursing, discussions with consultants, evaluation of patient's response to treatment, examination of patient, obtaining history from patient or surrogate, ordering and performing treatments and interventions, ordering and review of laboratory studies, ordering and review of radiographic studies, pulse oximetry and re-evaluation of patient's condition.   Otilio Carpen Laurielle Selmon, PA-C

## 2020-04-28 NOTE — Significant Event (Addendum)
Rapid Response Event Note   Reason for Call :  Called for pt with increased abd pain and distention s/p ERCP 8/18.  Initial Focused Assessment:  On arrival, pt laying in bed with c/o abdominal pain. Abd distended and firm. Lung sounds diminished, pt with shallow respirations d/t pain.   VS: temp 98.7, HR 117, BP 129/84, RR 22, spO2 93% on 3L [place].   After looking at prior notes, pts assessment does not seem like much of a change with abd pain and distention since admission.    Interventions:  Md called prior to my arrival and ordered CT abd, EKG, Lactic Acid  Plan of Care:  Continue to monitor pt. Awaiting CT results. Control pain with PRN Dilaudid. RN instructed to call with any changes or concerns.    Event Summary:   MD Notified:  Call Time: 2245 End Time: 2330  Called back at [place] stating pt now in afib RVR. Orders received for Cardizem gtt.  Md called back after HR not improving with cardizem and Digoxin. Orders received for amio gtt and bolus, 1L LR bolus and transfer pt to 4E. Pt transferred to 4E27  VS: HR 135, BP 101/67, RR 29, spO2 96%.  Event ended @ [place], RN

## 2020-04-28 NOTE — Progress Notes (Signed)
Dr Tonie Griffith paged with critical low calcium level = 5.9. Return call received. New order received for calcium 1g IVPB.

## 2020-04-28 NOTE — Progress Notes (Signed)
RT note-HFNC started, rapid response called, patient continues to be anxious, currently on 30L and 100% fio2, sp02 now 89-90%, HR 170, RN at bed side.

## 2020-04-28 NOTE — Progress Notes (Signed)
Pt converted from A fib to sinus tachycardia. EKG obtained. Results in chart.  Arletta Bale, RN

## 2020-04-28 NOTE — Progress Notes (Signed)
[facility] Progress Note Patient Name: Adam Fowler DOB: [birth year] MRN: [id number]   Date of Service  04/28/2020  HPI/Events of Note  Agitation - Patient pulling off oxygen.   eICU Interventions  Plan: 1. Precedex 0.2 -0.5 mcg/kg/hour. Titrate to RASS = 0.      Intervention Category Major Interventions: Delirium, psychosis, severe agitation - evaluation and management  [name] 04/28/2020, 11:08 PM

## 2020-04-28 NOTE — Significant Event (Signed)
Rapid Response Event Note   Reason for Call :  afib and increased work of breathing  Initial Focused Assessment:  When walking into the patient's room the patient was sitting on the edge of the bed trying to catch his breath.  He stated that "it feels like i'm running a marathon and can't stop."  The patient is in afib and was started on cardizem, amio, and digoxin last night.  He was transferred to a higher level of care by my associate, Nikki.  The patient was A&O x 4.    BP 133/98 (108) ECG 152 Afib Resp 34 and labored O2 89 NRB   Interventions:  We gave the patient lasix, ABG drawn, xray, ativan, and I talked to the doctor about my concerns about the patient.  Plan of Care:  Patient is going to be diuresed.  The dr ordered a foley for acurate I&O.  The doctor came to the bedside and assessed the patient.  Will continue the patient on Amio   Event Summary:   MD Notified: Dr. Sherral Hammers Call Time: na Arrival [id number] End Time: [place], RN

## 2020-04-28 NOTE — Progress Notes (Signed)
Transfer from 5 MW to RM 27 accompanied by RN and Rapid Response RN,alert and oriented tacheipnic RR at 30 BPM, non rebreather mask applied saturaion at 96%.attached to cardiac monitoring CCMD notified, HR at 150s /min,atrial fibrillation. B/P checked,Amiodarone bolus and drip started.Will give report to the incoming RN.

## 2020-04-29 ENCOUNTER — Inpatient Hospital Stay (HOSPITAL_COMMUNITY): Payer: 59

## 2020-04-29 DIAGNOSIS — K858 Other acute pancreatitis without necrosis or infection: Secondary | ICD-10-CM

## 2020-04-29 DIAGNOSIS — J90 Pleural effusion, not elsewhere classified: Secondary | ICD-10-CM

## 2020-04-29 DIAGNOSIS — J9601 Acute respiratory failure with hypoxia: Secondary | ICD-10-CM

## 2020-04-29 LAB — GLUCOSE, CAPILLARY
Glucose-Capillary: 139 mg/dL — ABNORMAL HIGH (ref 70–99)
Glucose-Capillary: 149 mg/dL — ABNORMAL HIGH (ref 70–99)
Glucose-Capillary: 155 mg/dL — ABNORMAL HIGH (ref 70–99)
Glucose-Capillary: 168 mg/dL — ABNORMAL HIGH (ref 70–99)
Glucose-Capillary: 172 mg/dL — ABNORMAL HIGH (ref 70–99)
Glucose-Capillary: 183 mg/dL — ABNORMAL HIGH (ref 70–99)

## 2020-04-29 LAB — BRAIN NATRIURETIC PEPTIDE: B Natriuretic Peptide: 295 pg/mL — ABNORMAL HIGH (ref 0.0–100.0)

## 2020-04-29 LAB — PROCALCITONIN: Procalcitonin: 4.16 ng/mL

## 2020-04-29 LAB — COMPREHENSIVE METABOLIC PANEL
ALT: 29 U/L (ref 0–44)
AST: 47 U/L — ABNORMAL HIGH (ref 15–41)
Albumin: 2.6 g/dL — ABNORMAL LOW (ref 3.5–5.0)
Alkaline Phosphatase: 51 U/L (ref 38–126)
Anion gap: 16 — ABNORMAL HIGH (ref 5–15)
BUN: 29 mg/dL — ABNORMAL HIGH (ref 8–23)
CO2: 18 mmol/L — ABNORMAL LOW (ref 22–32)
Calcium: 6.2 mg/dL — CL (ref 8.9–10.3)
Chloride: 108 mmol/L (ref 98–111)
Creatinine, Ser: 1.15 mg/dL (ref 0.61–1.24)
GFR calc Af Amer: >60 mL/min (ref >60)
GFR calc non Af Amer: >60 mL/min (ref >60)
Glucose, Bld: 153 mg/dL — ABNORMAL HIGH (ref 70–99)
Potassium: 2.8 mmol/L — ABNORMAL LOW (ref 3.5–5.1)
Sodium: 142 mmol/L (ref 135–145)
Total Bilirubin: 2.4 mg/dL — ABNORMAL HIGH (ref 0.3–1.2)
Total Protein: 5.4 g/dL — ABNORMAL LOW (ref 6.5–8.1)

## 2020-04-29 LAB — CBC WITH DIFFERENTIAL/PLATELET
Abs Immature Granulocytes: 0.16 10*3/uL — ABNORMAL HIGH (ref 0.00–0.07)
Basophils Absolute: 0 10*3/uL (ref 0.0–0.1)
Basophils Relative: 0 %
Eosinophils Absolute: 0.1 10*3/uL (ref 0.0–0.5)
Eosinophils Relative: 1 %
HCT: 40 % (ref 39.0–52.0)
Hemoglobin: 13 g/dL (ref 13.0–17.0)
Immature Granulocytes: 1 %
Lymphocytes Relative: 4 %
Lymphs Abs: 0.7 10*3/uL (ref 0.7–4.0)
MCH: 29.5 pg (ref 26.0–34.0)
MCHC: 32.5 g/dL (ref 30.0–36.0)
MCV: 90.7 fL (ref 80.0–100.0)
Monocytes Absolute: 1 10*3/uL (ref 0.1–1.0)
Monocytes Relative: 6 %
Neutro Abs: 14.4 10*3/uL — ABNORMAL HIGH (ref 1.7–7.7)
Neutrophils Relative %: 88 %
Platelets: 156 10*3/uL (ref 150–400)
RBC: 4.41 MIL/uL (ref 4.22–5.81)
RDW: 16.1 % — ABNORMAL HIGH (ref 11.5–15.5)
WBC Morphology: INCREASED
WBC: 16.3 10*3/uL — ABNORMAL HIGH (ref 4.0–10.5)
nRBC: 0 % (ref 0.0–0.2)

## 2020-04-29 LAB — POCT I-STAT 7, (LYTES, BLD GAS, ICA,H+H)
Acid-base deficit: 5 mmol/L — ABNORMAL HIGH (ref 0.0–2.0)
Bicarbonate: 21.9 mmol/L (ref 20.0–28.0)
Calcium, Ion: 0.91 mmol/L — ABNORMAL LOW (ref 1.15–1.40)
HCT: 38 % — ABNORMAL LOW (ref 39.0–52.0)
Hemoglobin: 12.9 g/dL — ABNORMAL LOW (ref 13.0–17.0)
O2 Saturation: 94 %
Patient temperature: 98.7
Potassium: 3.2 mmol/L — ABNORMAL LOW (ref 3.5–5.1)
Sodium: 144 mmol/L (ref 135–145)
TCO2: 23 mmol/L (ref 22–32)
pCO2 arterial: 49.1 mmHg — ABNORMAL HIGH (ref 32.0–48.0)
pH, Arterial: 7.258 — ABNORMAL LOW (ref 7.350–7.450)
pO2, Arterial: 85 mmHg (ref 83.0–108.0)

## 2020-04-29 LAB — ECHOCARDIOGRAM COMPLETE
Area-P 1/2: 3.21 cm2
Height: 69 in
S' Lateral: 2.82 cm
Weight: 3121.71 oz

## 2020-04-29 LAB — LIPASE, BLOOD: Lipase: 45 U/L (ref 11–51)

## 2020-04-29 LAB — BLOOD GAS, ARTERIAL
Acid-base deficit: 2.6 mmol/L — ABNORMAL HIGH (ref 0.0–2.0)
Bicarbonate: 21.7 mmol/L (ref 20.0–28.0)
Drawn by: 12971
FIO2: 70
O2 Saturation: 94.9 %
Patient temperature: 37
pCO2 arterial: 37.6 mmHg (ref 32.0–48.0)
pH, Arterial: 7.379 (ref 7.350–7.450)
pO2, Arterial: 66.8 mmHg — ABNORMAL LOW (ref 83.0–108.0)

## 2020-04-29 LAB — MAGNESIUM: Magnesium: 2.4 mg/dL (ref 1.7–2.4)

## 2020-04-29 LAB — PHOSPHORUS: Phosphorus: 1.9 mg/dL — ABNORMAL LOW (ref 2.5–4.6)

## 2020-04-29 LAB — HEMOGLOBIN A1C
Hgb A1c MFr Bld: 5.6 % (ref 4.8–5.6)
Mean Plasma Glucose: 114.02 mg/dL

## 2020-04-29 LAB — TSH: TSH: 4.383 u[IU]/mL (ref 0.350–4.500)

## 2020-04-29 LAB — TRIGLYCERIDES: Triglycerides: 184 mg/dL — ABNORMAL HIGH (ref <150)

## 2020-04-29 MED ORDER — VANCOMYCIN HCL IN DEXTROSE 1-5 GM/200ML-% IV SOLN
1000.0000 mg | Freq: Two times a day (BID) | INTRAVENOUS | Status: DC
  Administered 2020-04-29 – 2020-04-30 (×2): 1000 mg via INTRAVENOUS
  Filled 2020-04-29 (×2): qty 200

## 2020-04-29 MED ORDER — PROPOFOL 1000 MG/100ML IV EMUL
5.0000 ug/kg/min | INTRAVENOUS | Status: DC
  Administered 2020-04-29: 10 ug/kg/min via INTRAVENOUS
  Administered 2020-04-29 (×2): 75 ug/kg/min via INTRAVENOUS
  Administered 2020-04-29: 50 ug/kg/min via INTRAVENOUS
  Administered 2020-04-29: 80 ug/kg/min via INTRAVENOUS
  Administered 2020-04-29: 75 ug/kg/min via INTRAVENOUS
  Administered 2020-04-29: 65 ug/kg/min via INTRAVENOUS
  Administered 2020-04-29: 75 ug/kg/min via INTRAVENOUS
  Administered 2020-04-30 (×2): 65 ug/kg/min via INTRAVENOUS
  Administered 2020-04-30: 75 ug/kg/min via INTRAVENOUS
  Administered 2020-04-30: 55 ug/kg/min via INTRAVENOUS
  Filled 2020-04-29: qty 200
  Filled 2020-04-29 (×3): qty 100
  Filled 2020-04-29: qty 200
  Filled 2020-04-29 (×6): qty 100

## 2020-04-29 MED ORDER — NOREPINEPHRINE 4 MG/250ML-% IV SOLN
INTRAVENOUS | Status: AC
  Filled 2020-04-29: qty 250

## 2020-04-29 MED ORDER — INSULIN ASPART 100 UNIT/ML ~~LOC~~ SOLN
0.0000 [IU] | SUBCUTANEOUS | Status: DC
  Administered 2020-04-29: 3 [IU] via SUBCUTANEOUS
  Administered 2020-04-29: 2 [IU] via SUBCUTANEOUS
  Administered 2020-04-29: 3 [IU] via SUBCUTANEOUS
  Administered 2020-04-29: 2 [IU] via SUBCUTANEOUS
  Administered 2020-04-29: 3 [IU] via SUBCUTANEOUS
  Administered 2020-04-30: 2 [IU] via SUBCUTANEOUS
  Administered 2020-04-30 (×2): 3 [IU] via SUBCUTANEOUS
  Administered 2020-04-30 – 2020-05-01 (×3): 2 [IU] via SUBCUTANEOUS
  Administered 2020-05-01 – 2020-05-02 (×4): 3 [IU] via SUBCUTANEOUS
  Administered 2020-05-02: 5 [IU] via SUBCUTANEOUS
  Administered 2020-05-02: 3 [IU] via SUBCUTANEOUS
  Administered 2020-05-02: 2 [IU] via SUBCUTANEOUS
  Administered 2020-05-02 – 2020-05-03 (×4): 3 [IU] via SUBCUTANEOUS
  Administered 2020-05-03 (×2): 5 [IU] via SUBCUTANEOUS
  Administered 2020-05-03 – 2020-05-04 (×2): 3 [IU] via SUBCUTANEOUS
  Administered 2020-05-04: 2 [IU] via SUBCUTANEOUS
  Administered 2020-05-04 (×2): 3 [IU] via SUBCUTANEOUS
  Administered 2020-05-04 (×2): 2 [IU] via SUBCUTANEOUS
  Administered 2020-05-05 (×3): 3 [IU] via SUBCUTANEOUS
  Administered 2020-05-05: 8 [IU] via SUBCUTANEOUS
  Administered 2020-05-05: 3 [IU] via SUBCUTANEOUS
  Administered 2020-05-05: 5 [IU] via SUBCUTANEOUS
  Administered 2020-05-06 (×3): 3 [IU] via SUBCUTANEOUS
  Administered 2020-05-06 (×2): 5 [IU] via SUBCUTANEOUS
  Administered 2020-05-06 – 2020-05-07 (×3): 3 [IU] via SUBCUTANEOUS
  Administered 2020-05-07 (×3): 5 [IU] via SUBCUTANEOUS
  Administered 2020-05-08 – 2020-05-10 (×15): 3 [IU] via SUBCUTANEOUS

## 2020-04-29 MED ORDER — CHLORHEXIDINE GLUCONATE 0.12% ORAL RINSE (MEDLINE KIT): 15.0000 mL | Freq: Two times a day (BID) | OROMUCOSAL | Status: DC

## 2020-04-29 MED ORDER — CHLORHEXIDINE GLUCONATE 0.12% ORAL RINSE (MEDLINE KIT)
15.0000 mL | Freq: Two times a day (BID) | OROMUCOSAL | Status: DC
  Administered 2020-04-29 – 2020-05-01 (×5): 15 mL via OROMUCOSAL

## 2020-04-29 MED ORDER — PROPOFOL 10 MG/ML IV BOLUS
INTRAVENOUS | Status: AC
  Filled 2020-04-29: qty 20

## 2020-04-29 MED ORDER — VECURONIUM BROMIDE 10 MG IV SOLR
INTRAVENOUS | Status: AC
  Filled 2020-04-29: qty 10

## 2020-04-29 MED ORDER — POTASSIUM CHLORIDE 10 MEQ/100ML IV SOLN
10.0000 meq | INTRAVENOUS | Status: AC
  Administered 2020-04-29 (×4): 10 meq via INTRAVENOUS
  Filled 2020-04-29 (×2): qty 100

## 2020-04-29 MED ORDER — ETOMIDATE 2 MG/ML IV SOLN
20.0000 mg | Freq: Once | INTRAVENOUS | Status: AC
  Administered 2020-04-29: 20 mg via INTRAVENOUS

## 2020-04-29 MED ORDER — IOHEXOL 350 MG/ML SOLN
100.0000 mL | Freq: Once | INTRAVENOUS | Status: AC | PRN
  Administered 2020-04-29: 100 mL via INTRAVENOUS

## 2020-04-29 MED ORDER — ORAL CARE MOUTH RINSE
15.0000 mL | OROMUCOSAL | Status: DC
  Administered 2020-04-29 (×2): 15 mL via OROMUCOSAL

## 2020-04-29 MED ORDER — CALCIUM GLUCONATE-NACL 2-0.675 GM/100ML-% IV SOLN
2.0000 g | Freq: Once | INTRAVENOUS | Status: AC
  Administered 2020-04-29: 2000 mg via INTRAVENOUS
  Filled 2020-04-29: qty 100

## 2020-04-29 MED ORDER — VANCOMYCIN HCL 1500 MG/300ML IV SOLN
1500.0000 mg | Freq: Once | INTRAVENOUS | Status: AC
  Administered 2020-04-29: 1500 mg via INTRAVENOUS
  Filled 2020-04-29: qty 300

## 2020-04-29 MED ORDER — POTASSIUM PHOSPHATES 15 MMOLE/5ML IV SOLN
30.0000 mmol | Freq: Once | INTRAVENOUS | Status: AC
  Administered 2020-04-29: 30 mmol via INTRAVENOUS
  Filled 2020-04-29: qty 10

## 2020-04-29 MED ORDER — SUCCINYLCHOLINE CHLORIDE 200 MG/10ML IV SOSY
PREFILLED_SYRINGE | INTRAVENOUS | Status: AC
  Filled 2020-04-29: qty 10

## 2020-04-29 MED ORDER — ORAL CARE MOUTH RINSE
15.0000 mL | OROMUCOSAL | Status: DC
  Administered 2020-04-29 – 2020-05-01 (×22): 15 mL via OROMUCOSAL

## 2020-04-29 MED ORDER — SODIUM CHLORIDE 0.9 % IV SOLN
250.0000 mL | INTRAVENOUS | Status: DC
  Administered 2020-04-30: 250 mL via INTRAVENOUS

## 2020-04-29 MED ORDER — PHENYLEPHRINE HCL-NACL 10-0.9 MG/250ML-% IV SOLN
25.0000 ug/min | INTRAVENOUS | Status: DC
  Administered 2020-04-29: 10 ug/min via INTRAVENOUS
  Filled 2020-04-29 (×2): qty 250

## 2020-04-29 MED ORDER — MAGNESIUM SULFATE 2 GM/50ML IV SOLN
INTRAVENOUS | Status: AC
  Filled 2020-04-29: qty 50

## 2020-04-29 MED ORDER — MAGNESIUM SULFATE 2 GM/50ML IV SOLN
2.0000 g | Freq: Once | INTRAVENOUS | Status: DC
  Filled 2020-04-29: qty 50

## 2020-04-29 MED ORDER — ROCURONIUM BROMIDE 50 MG/5ML IV SOLN: 90.0000 mg | Freq: Once | INTRAVENOUS | Status: DC

## 2020-04-29 MED ORDER — PANTOPRAZOLE SODIUM 40 MG IV SOLR
40.0000 mg | Freq: Every day | INTRAVENOUS | Status: DC
  Administered 2020-04-29: 40 mg via INTRAVENOUS
  Filled 2020-04-29: qty 40

## 2020-04-29 MED ORDER — FENTANYL CITRATE (PF) 100 MCG/2ML IJ SOLN
50.0000 ug | Freq: Once | INTRAMUSCULAR | Status: AC
  Administered 2020-04-29: 50 ug via INTRAVENOUS

## 2020-04-29 MED ORDER — POTASSIUM CHLORIDE 10 MEQ/100ML IV SOLN
10.0000 meq | INTRAVENOUS | Status: AC
  Administered 2020-04-29 (×4): 10 meq via INTRAVENOUS
  Filled 2020-04-29 (×4): qty 100

## 2020-04-29 NOTE — Progress Notes (Signed)
[facility] MD on call repaged for CCM consult and confirm precedex order after not hearing back from pharmacy.

## 2020-04-29 NOTE — Progress Notes (Signed)
Preparing patient for intubation by CCM.

## 2020-04-29 NOTE — Progress Notes (Signed)
Spoke with elink and Dr Oletta Darter regarding patient deteriorating condition and pending consult from [facility]. Dr Oletta Darter confirmed consult, CCM to evaluate status.

## 2020-04-29 NOTE — Progress Notes (Signed)
[facility] on call physician repaged, no response from first page. Requested something for relaxation and updated physician on tachypnea (36-42 bpm), sats 88-92% on HFNC plus NRB 100%. Informed of increased agitation/confusion.

## 2020-04-29 NOTE — Progress Notes (Signed)
Patient seen and examined at bedside 64 year old male with acute pancreatitis post ERCP, new onset paroxysmal A. fib and acute hypoxic respiratory failure due to bilateral pleural effusion and pulmonary edema    Physical exam: General: Chronically ill-appearing male, orally intubated HEAENT: [place]/AT, eyes anicteric.  ETT and OGT in place Neuro: Sedated, restless, not following commands.  Eyes are closed.  Pupils 3 mm bilateral reactive to light Chest: Bilateral crackles all over, no wheezes or Heart: Regular rate and rhythm, no murmurs or gallops Abdomen: Soft, nontender, nondistended, bowel sounds present Skin: No rash   Assessment and plan: Acute hypoxic respiratory failure due to volume overload New onset paroxysmal atrial fibrillation with RVR, now converted to sinus rhythm Severe electrolyte imbalance including hypokalemia, hypocalcemia, hypophosphatemia and hypomagnesemia Acute pancreatitis status post ERCP  Continue mechanical ventilation, patient converted to sinus rhythm GI follow-up Continue broad-spectrum antibiotics Supplement electrolytes and continue to monitor    Jacky Kindle MD Critical care physician [place]  Pager: [phone] Mobile: [phone] \

## 2020-04-29 NOTE — Progress Notes (Signed)
Patient rec'd from 4E, RR 30's-40's. Restless, sats 92-94% on HFNC. Confused to place, time and situation. Will monitor for increased RR and/or drop in sats.

## 2020-04-29 NOTE — Progress Notes (Signed)
CCM in patient room evaluating status. Continues to be restless/confused. Sats 88-92%, patient has pulled IV's out as well as foley, and condom cath. Condom cath replacedX2, draining well at present. Continues to take oxygen off despite repeated attempts to educate on the need to leave it in place due to low oxygen saturation.

## 2020-04-29 NOTE — Procedures (Signed)
Intubation Procedure Note  Adam Fowler  [id number]  09-Jun-1956  Date:04/29/20  Time:12:25 AM   Provider Performing:Brigg Cape Duwayne Heck    Procedure: Intubation (61683)  Indication(s) Respiratory Failure  Consent Risks of the procedure as well as the alternatives and risks of each were explained to the patient and/or caregiver.  Consent for the procedure was obtained and is signed in the bedside chart   Anesthesia Etomidate, Fentanyl and Rocuronium   Time Out Verified patient identification, verified procedure, site/side was marked, verified correct patient position, special equipment/implants available, medications/allergies/relevant history reviewed, required imaging and test results available. Spoke with wife about likely need.    Sterile Technique Usual hand hygeine, masks, and gloves were used   Procedure Description Patient positioned in bed supine.  Sedation given as noted above.  Patient was intubated with endotracheal tube using Glidescope.  View was Grade 1 full glottis .  Number of attempts was 1.  Colorimetric CO2 detector was consistent with tracheal placement.   Complications/Tolerance None; patient tolerated the procedure well. Briefly dropped sats to mid 70s but returned to 90s in less than 1 min.  Sat only 93 prior to procedure.  [facility] [place] continued during procedure.  Chest X-ray is ordered to verify placement.   EBL none   Specimen(s) None

## 2020-04-29 NOTE — Progress Notes (Signed)
[facility] Progress Note Patient Name: Adam Fowler DOB: 11/21/1955 MRN: [id number]   Date of Service  04/29/2020  HPI/Events of Note  Hypotension - BP = 80/60.   eICU Interventions  Plan: 1. Phenylephrine IV infusion via PIV. Titrate to AMP >= 65.      Intervention Category Major Interventions: Hypotension - evaluation and management  Ivylynn Hoppes [place] 04/29/2020, 5:22 AM

## 2020-04-29 NOTE — Progress Notes (Signed)
Orders for precedex rec'd, pharmacy called me to question bolus vs drip only. Drip only confirmed, pharmacist to double verify with [facility] on call MD.

## 2020-04-29 NOTE — Progress Notes (Signed)
*  PRELIMINARY RESULTS* Echocardiogram 2D Echocardiogram has been performed.  Leavy Cella 04/29/2020, 12:51 PM

## 2020-04-29 NOTE — Progress Notes (Signed)
Pharmacy Antibiotic Note  Adam Fowler is a 64 y.o. male admitted on 04/26/2020 with biliary leak, now w/ concern for pneumonia.  Pharmacy has been consulted for vancomycin dosing.  Plan: Vancomycin 1500mg  x1 then 1000mg  IV every 12 hours.  Goal trough 15-20 mcg/mL.  Height: 5\' 9"  (175.3 cm) Weight: 88.5 kg (195 lb 1.7 oz) IBW/kg (Calculated) : 70.7  Temp (24hrs), Avg:98.2 F (36.8 C), Min:97.5 F (36.4 C), Max:99 F (37.2 C)  Recent Labs  Lab 04/25/20 2034 04/26/20 1214 04/26/20 1715 04/26/20 2151 04/27/20 0900 04/28/20 0016 04/28/20 0804 04/29/20 0202  WBC 21.4* 41.3*  --   --  25.4* 18.0*  --  16.3*  CREATININE 1.32* 1.41*  --   --  1.34* 1.15  --  1.15  LATICACIDVEN  --   --  8.4* 4.6* 3.1* 2.4* 1.5  --     Estimated Creatinine Clearance: 71.4 mL/min (by C-G formula based on SCr of 1.15 mg/dL).    No Known Allergies   Thank you for allowing pharmacy to be a part of this patients care.  Wynona Neat, PharmD, BCPS  04/29/2020 6:54 AM

## 2020-04-29 NOTE — Progress Notes (Signed)
Paged [facility] on call physician to update on patient condition, RR remains high 30's-40. NRB mask added to HFNC with sats 88-92%. Confusion/agitation and restlessness increased.

## 2020-04-29 NOTE — Progress Notes (Signed)
Progress Note   Subjective  Patient moved to medical ICU overnight for hypoxia and altered mental status Intubated late yesterday for the same He spontaneously converted from atrial fibrillation to normal sinus rhythm yesterday evening He remains intubated and sedated   Objective  Vital signs in last 24 hours: Temp:  [97.5 F (36.4 C)-99 F (37.2 C)] 98.5 F (36.9 C) (08/22 0700) Pulse Rate:  [64-152] 74 (08/22 0730) Resp:  [16-36] 16 (08/22 0730) BP: (78-194)/(53-129) 107/71 (08/22 0730) SpO2:  [75 %-100 %] 100 % (08/22 0740) FiO2 (%):  [60 %-100 %] 70 % (08/22 0740) Last BM Date: 04/25/20  Gen: Intubated sedated HEENT: anicteric, ett in place CV: RRR Pulm: Coarse bilaterally anteriorly Abd: Mildly distended and mildly firm abdomen, not tense, no significant change compared to the last 48 hours, bowel sounds present Ext: no c/c/e, warm Neuro: medically sedated, pupils errl  Intake/Output from previous day: 08/21 0701 - 08/22 0700 In: 4428.8 [I.V.:3740.3; IV Piggyback:688.5] Out: 0160 [Urine:3425; Drains:25] Intake/Output this shift: Total I/O In: 58.3 [I.V.:58.3] Out: -   Lab Results: Recent Labs    04/27/20 0900 04/27/20 0900 04/28/20 0016 04/28/20 0016 04/28/20 2334 04/29/20 0202 04/29/20 0412  WBC 25.4*  --  18.0*  --   --  16.3*  --   HGB 16.8   < > 14.7   < > 15.6 13.0 12.9*  HCT 51.9   < > 46.5   < > 46.0 40.0 38.0*  PLT 161  --  136*  --   --  156  --    < > = values in this interval not displayed.   BMET Recent Labs    04/27/20 0900 04/27/20 0900 04/28/20 0016 04/28/20 0016 04/28/20 2334 04/29/20 0202 04/29/20 0412  NA 139   < > 138   < > 142 142 144  K 4.4   < > 4.0   < > 2.8* 2.8* 3.2*  CL 108  --  111  --   --  108  --   CO2 15*  --  15*  --   --  18*  --   GLUCOSE 176*  --  159*  --   --  153*  --   BUN 44*  --  36*  --   --  29*  --   CREATININE 1.34*  --  1.15  --   --  1.15  --   CALCIUM 6.3*  --  5.9*  --   --  6.2*  --     < > = values in this interval not displayed.   LFT Recent Labs    04/26/20 1716 04/27/20 0900 04/29/20 0202  PROT 7.7   < > 5.4*  ALBUMIN 4.3   < > 2.6*  AST 102*   < > 47*  ALT 48*   < > 29  ALKPHOS 69   < > 51  BILITOT 3.9*   < > 2.4*  BILIDIR 0.6*  --   --   IBILI 3.3*  --   --    < > = values in this interval not displayed.    Studies/Results: DG Abd 1 View  Result Date: 04/29/2020 CLINICAL DATA:  OG tube placement EXAM: ABDOMEN - 1 VIEW COMPARISON:  CT 04/28/2020 FINDINGS: Transesophageal tube tip terminates at the GE junction with the side port in the distal thoracic esophagus. A right upper stepped in a pigtail catheter is seen as well as a biliary stent in  the right upper quadrant. Rounded centrally lucent radiodensity projects over the mid abdomen, nonspecific and likely external to the patient. Additional telemetry leads overlie the midline abdomen. Multiple air distended loops of bowel in the upper abdomen compatible with probable ileus. Atelectatic changes and effusions present in the lung bases. Osseous structures are unremarkable. IMPRESSION: 1. Transesophageal tube tip terminates at the GE junction with the side port in the distal thoracic esophagus. Recommend advancing approximately 10-15 cm for optimal functioning. 2. Right upper quadrant biliary stent and surgical drain in similar position to comparison CT. 3. Multiple air distended loops of bowel in the upper abdomen compatible with probable ileus. 4. Ovoid, centrally lucent structure projecting over the mid abdomen, possibly external to the patient. Correlate with visual inspection. 5. Atelectatic changes and effusions in the lung bases. These results will be called to the ordering clinician or representative by the Radiologist Assistant, and communication documented in the PACS or [employer]. Electronically Signed   By: Lovena Le M.D.   On: 04/29/2020 04:01   CT ANGIO CHEST PE W OR WO CONTRAST  Result Date:  04/29/2020 CLINICAL DATA:  PE suspected, high prob EXAM: CT ANGIOGRAPHY CHEST WITH CONTRAST TECHNIQUE: Multidetector CT imaging of the chest was performed using the standard protocol during bolus administration of intravenous contrast. Multiplanar CT image reconstructions and MIPs were obtained to evaluate the vascular anatomy. CONTRAST:  130mL OMNIPAQUE IOHEXOL 350 MG/ML SOLN COMPARISON:  None. FINDINGS: Cardiovascular: Contrast injection is sufficient to demonstrate satisfactory opacification of the pulmonary arteries to the segmental level. There is no pulmonary embolus or evidence of right heart strain. The size of the main pulmonary artery is normal. Heart size is normal, with no pericardial effusion. The course and caliber of the aorta are normal. There is no atherosclerotic calcification. Opacification decreased due to pulmonary arterial phase contrast bolus timing. Mediastinum/Nodes: No mediastinal, hilar or axillary lymphadenopathy. Normal visualized thyroid. Thoracic esophageal course is normal. Lungs/Pleura: Intermediate sized pleural effusions. Bilateral upper lobe interstitial opacities and basilar atelectasis. Upper Abdomen: Contrast bolus timing is not optimized for evaluation of the abdominal organs. The visualized portions of the organs of the upper abdomen are normal. Musculoskeletal: No chest wall abnormality. No bony spinal canal stenosis. Review of the MIP images confirms the above findings. IMPRESSION: 1. No pulmonary embolus or acute aortic syndrome. 2. Intermediate sized pleural effusions with bilateral upper lobe interstitial opacities and basilar atelectasis. This may indicate pulmonary edema. Electronically Signed   By: Ulyses Jarred M.D.   On: 04/29/2020 06:34   CT ABDOMEN PELVIS W CONTRAST  Result Date: 04/28/2020 CLINICAL DATA:  Acute, nonlocalized abdominal pain EXAM: CT ABDOMEN AND PELVIS WITH CONTRAST TECHNIQUE: Multidetector CT imaging of the abdomen and pelvis was performed  using the standard protocol following bolus administration of intravenous contrast. CONTRAST:  152mL OMNIPAQUE IOHEXOL 300 MG/ML  SOLN COMPARISON:  04/26/2020 FINDINGS: Lower chest: Moderate bilateral pleural effusions are present with subtotal collapse of the lower lobes bilaterally. These are new since prior examination and bibasilar atelectasis has progressed. Visualized heart and pericardium are unremarkable. Hepatobiliary: Percutaneous cholecystostomy is unchanged with its retaining loop formed within the decompressed gallbladder lumen. Metallic internal biliary stent is seen within the extrahepatic bile duct and there is mild pneumobilia again identified suggesting patency of the stented segment. The liver is otherwise unremarkable. Pancreas: There is extensive peripancreatic inflammatory stranding again identified, similar to that noted on prior examination in keeping with changes of acute pancreatitis. 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 The pancreatic duct is not dilated. No loculated peripancreatic fluid collections are identified. No intraparenchymal calcifications are seen. Spleen: Unremarkable.  The splenic vein is patent. Adrenals/Urinary Tract: The adrenal glands are unremarkable. A few scattered nonobstructing calculi are seen within the kidneys bilaterally measuring up to 3 mm. No ureteral calculi. No hydronephrosis. No enhancing intrarenal masses. The bladder is unremarkable. Stomach/Bowel: Mild sigmoid diverticulosis. Scattered diverticular seen within the transverse and descending colon. The large bowel is otherwise unremarkable. Prominent loops of gas and fluid-filled small bowel are seen throughout the abdomen suggesting a mild adynamic ileus. The stomach is unremarkable. The appendix is  unremarkable. There is mild ascites present, stable in volume since prior examination. No free intraperitoneal gas. Vascular/Lymphatic: The abdominal vasculature is age-appropriate with minimal atherosclerotic calcification. No aneurysm. No pathologic adenopathy within the abdomen and pelvis. Shotty mesenteric adenopathy is likely reactive in nature. Reproductive: Prostate is unremarkable. Other: Rectum unremarkable. Musculoskeletal: No lytic or blastic bone lesion. IMPRESSION: 1. Findings in keeping with acute pancreatitis. 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 No loculated peripancreatic fluid collections. 2. Moderate bilateral pleural effusions with subtotal collapse of the lower lobes bilaterally. These are new since prior examination. 3. Metallic internal biliary stent unchanged, with stable pneumobilia suggesting patency. Stable percutaneous cholecystostomy with decompression of the gallbladder lumen. 4. Prominent loops of gas and fluid-filled small bowel throughout the abdomen suggesting a mild adynamic ileus. Electronically Signed   By: Fidela Salisbury MD   On: 04/28/2020 03:06   DG Chest Port 1 View  Result Date: 04/29/2020 CLINICAL DATA:  Endotracheal tube placement EXAM: PORTABLE CHEST 1 VIEW COMPARISON:  04/28/2020 FINDINGS: Endotracheal tube tip at the level of the clavicular heads. There is shallow lung inflation with greater than right airspace opacities. Small pleural effusions. IMPRESSION: 1. Endotracheal tube tip at the level of the clavicular heads. 2. Bilateral airspace opacities, which may indicate multifocal infection or pulmonary edema. Electronically Signed   By: Ulyses Jarred M.D.   On: 04/29/2020 00:42   DG CHEST PORT 1 VIEW  Result Date: 04/28/2020 CLINICAL DATA:  Shortness of breath EXAM: PORTABLE CHEST 1 VIEW  COMPARISON:  04/27/2020 and prior radiographs FINDINGS: This is a low volume study. Cardiomediastinal silhouette is unchanged. Bilateral pleural effusions and bibasilar atelectasis noted. Mild interstitial prominence is noted which may represent interstitial edema. No pneumothorax. IMPRESSION: Bilateral pleural effusions with bibasilar atelectasis and possible mild interstitial pulmonary edema. Electronically Signed   By: Margarette Canada M.D.   On: 04/28/2020 15:10   DG CHEST PORT 1 VIEW  Result Date: 04/27/2020 CLINICAL DATA:  Hypoxemia, chest and abdomen pain EXAM: PORTABLE CHEST 1 VIEW COMPARISON:  04/26/2020 FINDINGS: Single frontal view of the chest demonstrates an unremarkable cardiac silhouette. Lung volumes are diminished, with bibasilar consolidation left greater than right. Small left effusion cannot be excluded. No evidence pneumothorax. No acute bony abnormalities. IMPRESSION: 1. Low lung volumes, with bibasilar consolidation. Findings could reflect atelectasis or basilar pneumonia. 2. Trace left pleural effusion. Electronically Signed   By: Randa Ngo M.D.   On: 04/27/2020 17:20      Assessment & Recommendations  64 year old male with history of cholecystectomy complicated by bile leak status post ERCP on 04/25/2020 for biliary  stent exchange for persistent bile leak, post ERCP pancreatitis complicated by atrial fibrillation with RVR and now pulmonary edema/pleural effusions hypoxia  1.  Post ERCP pancreatitis --lipase has normalized, bilirubin continues to downtrend.  Stent in place which has adequately covered bile leak but manipulation resulted in pancreatitis.  This appears to be improving and care is supportive from a biliary and pancreatitis perspective.  2.  Hypoxia --likely result of aggressive hydration in the setting of pancreatitis but also atrial fibrillation with RVR.  He has been intubated for hypoxia and management now per critical care medicine.  Plan for diuresis.  He is  empirically covered for pneumonia  3.  A. fib with RVR --amiodarone infusion, now spontaneously converted to normal sinus rhythm  We will follow      LOS: 2 days   Jerene Bears  04/29/2020, 10:22 AM

## 2020-04-30 DIAGNOSIS — E872 Acidosis: Secondary | ICD-10-CM

## 2020-04-30 DIAGNOSIS — K920 Hematemesis: Secondary | ICD-10-CM

## 2020-04-30 DIAGNOSIS — N179 Acute kidney failure, unspecified: Secondary | ICD-10-CM

## 2020-04-30 LAB — POCT I-STAT 7, (LYTES, BLD GAS, ICA,H+H)
Acid-Base Excess: 0 mmol/L (ref 0.0–2.0)
Bicarbonate: 25.3 mmol/L (ref 20.0–28.0)
Calcium, Ion: 0.96 mmol/L — ABNORMAL LOW (ref 1.15–1.40)
HCT: 36 % — ABNORMAL LOW (ref 39.0–52.0)
Hemoglobin: 12.2 g/dL — ABNORMAL LOW (ref 13.0–17.0)
O2 Saturation: 99 %
Patient temperature: 98.7
Potassium: 2.7 mmol/L — CL (ref 3.5–5.1)
Sodium: 143 mmol/L (ref 135–145)
TCO2: 26 mmol/L (ref 22–32)
pCO2 arterial: 40.9 mmHg (ref 32.0–48.0)
pH, Arterial: 7.399 (ref 7.350–7.450)
pO2, Arterial: 132 mmHg — ABNORMAL HIGH (ref 83.0–108.0)

## 2020-04-30 LAB — COMPREHENSIVE METABOLIC PANEL
ALT: 24 U/L (ref 0–44)
AST: 33 U/L (ref 15–41)
Albumin: 2.3 g/dL — ABNORMAL LOW (ref 3.5–5.0)
Alkaline Phosphatase: 59 U/L (ref 38–126)
Anion gap: 16 — ABNORMAL HIGH (ref 5–15)
BUN: 27 mg/dL — ABNORMAL HIGH (ref 8–23)
CO2: 22 mmol/L (ref 22–32)
Calcium: 7.1 mg/dL — ABNORMAL LOW (ref 8.9–10.3)
Chloride: 104 mmol/L (ref 98–111)
Creatinine, Ser: 1.09 mg/dL (ref 0.61–1.24)
GFR calc Af Amer: >60 mL/min (ref >60)
GFR calc non Af Amer: >60 mL/min (ref >60)
Glucose, Bld: 167 mg/dL — ABNORMAL HIGH (ref 70–99)
Potassium: 2.7 mmol/L — CL (ref 3.5–5.1)
Sodium: 142 mmol/L (ref 135–145)
Total Bilirubin: 1.5 mg/dL — ABNORMAL HIGH (ref 0.3–1.2)
Total Protein: 5.5 g/dL — ABNORMAL LOW (ref 6.5–8.1)

## 2020-04-30 LAB — CBC WITH DIFFERENTIAL/PLATELET
Abs Immature Granulocytes: 0.92 10*3/uL — ABNORMAL HIGH (ref 0.00–0.07)
Basophils Absolute: 0.1 10*3/uL (ref 0.0–0.1)
Basophils Relative: 1 %
Eosinophils Absolute: 0.1 10*3/uL (ref 0.0–0.5)
Eosinophils Relative: 1 %
HCT: 39.8 % (ref 39.0–52.0)
Hemoglobin: 13.1 g/dL (ref 13.0–17.0)
Immature Granulocytes: 6 %
Lymphocytes Relative: 6 %
Lymphs Abs: 1 10*3/uL (ref 0.7–4.0)
MCH: 29.2 pg (ref 26.0–34.0)
MCHC: 32.9 g/dL (ref 30.0–36.0)
MCV: 88.8 fL (ref 80.0–100.0)
Monocytes Absolute: 0.9 10*3/uL (ref 0.1–1.0)
Monocytes Relative: 6 %
Neutro Abs: 13.8 10*3/uL — ABNORMAL HIGH (ref 1.7–7.7)
Neutrophils Relative %: 80 %
Platelets: 165 10*3/uL (ref 150–400)
RBC: 4.48 MIL/uL (ref 4.22–5.81)
RDW: 16.3 % — ABNORMAL HIGH (ref 11.5–15.5)
WBC: 16.8 10*3/uL — ABNORMAL HIGH (ref 4.0–10.5)
nRBC: 0 % (ref 0.0–0.2)

## 2020-04-30 LAB — CBC
HCT: 38.2 % — ABNORMAL LOW (ref 39.0–52.0)
Hemoglobin: 12.4 g/dL — ABNORMAL LOW (ref 13.0–17.0)
MCH: 29 pg (ref 26.0–34.0)
MCHC: 32.5 g/dL (ref 30.0–36.0)
MCV: 89.3 fL (ref 80.0–100.0)
Platelets: 151 10*3/uL (ref 150–400)
RBC: 4.28 MIL/uL (ref 4.22–5.81)
RDW: 16.5 % — ABNORMAL HIGH (ref 11.5–15.5)
WBC: 17.1 10*3/uL — ABNORMAL HIGH (ref 4.0–10.5)
nRBC: 0 % (ref 0.0–0.2)

## 2020-04-30 LAB — HEMOGLOBIN AND HEMATOCRIT, BLOOD
HCT: 38.8 % — ABNORMAL LOW (ref 39.0–52.0)
HCT: 37.6 % — ABNORMAL LOW (ref 39.0–52.0)
HCT: 37.6 % — ABNORMAL LOW (ref 39.0–52.0)
Hemoglobin: 12.8 g/dL — ABNORMAL LOW (ref 13.0–17.0)
Hemoglobin: 12.1 g/dL — ABNORMAL LOW (ref 13.0–17.0)
Hemoglobin: 12.3 g/dL — ABNORMAL LOW (ref 13.0–17.0)

## 2020-04-30 LAB — GLUCOSE, CAPILLARY
Glucose-Capillary: 117 mg/dL — ABNORMAL HIGH (ref 70–99)
Glucose-Capillary: 118 mg/dL — ABNORMAL HIGH (ref 70–99)
Glucose-Capillary: 120 mg/dL — ABNORMAL HIGH (ref 70–99)
Glucose-Capillary: 146 mg/dL — ABNORMAL HIGH (ref 70–99)
Glucose-Capillary: 150 mg/dL — ABNORMAL HIGH (ref 70–99)
Glucose-Capillary: 157 mg/dL — ABNORMAL HIGH (ref 70–99)

## 2020-04-30 LAB — PROCALCITONIN: Procalcitonin: 4.07 ng/mL

## 2020-04-30 LAB — BASIC METABOLIC PANEL
Anion gap: 13 (ref 5–15)
BUN: 26 mg/dL — ABNORMAL HIGH (ref 8–23)
CO2: 23 mmol/L (ref 22–32)
Calcium: 7.2 mg/dL — ABNORMAL LOW (ref 8.9–10.3)
Chloride: 106 mmol/L (ref 98–111)
Creatinine, Ser: 1.16 mg/dL (ref 0.61–1.24)
GFR calc Af Amer: >60 mL/min (ref >60)
GFR calc non Af Amer: >60 mL/min (ref >60)
Glucose, Bld: 176 mg/dL — ABNORMAL HIGH (ref 70–99)
Potassium: 2.9 mmol/L — ABNORMAL LOW (ref 3.5–5.1)
Sodium: 142 mmol/L (ref 135–145)

## 2020-04-30 LAB — PHOSPHORUS: Phosphorus: 2.6 mg/dL (ref 2.5–4.6)

## 2020-04-30 LAB — MRSA PCR SCREENING: MRSA by PCR: NEGATIVE

## 2020-04-30 LAB — MAGNESIUM
Magnesium: 2.2 mg/dL (ref 1.7–2.4)
Magnesium: 2.2 mg/dL (ref 1.7–2.4)

## 2020-04-30 LAB — LIPASE, BLOOD: Lipase: 23 U/L (ref 11–51)

## 2020-04-30 MED ORDER — FENTANYL CITRATE (PF) 100 MCG/2ML IJ SOLN
50.0000 ug | Freq: Once | INTRAMUSCULAR | Status: AC
  Administered 2020-04-30: 50 ug via INTRAVENOUS

## 2020-04-30 MED ORDER — FENTANYL BOLUS VIA INFUSION
50.0000 ug | INTRAVENOUS | Status: DC | PRN
  Administered 2020-04-30 – 2020-05-01 (×9): 50 ug via INTRAVENOUS
  Filled 2020-04-30: qty 50

## 2020-04-30 MED ORDER — SODIUM CHLORIDE 0.9 % IV SOLN
8.0000 mg/h | INTRAVENOUS | Status: DC
  Administered 2020-04-30: 8 mg/h via INTRAVENOUS
  Filled 2020-04-30 (×2): qty 80

## 2020-04-30 MED ORDER — PANTOPRAZOLE SODIUM 40 MG IV SOLR
40.0000 mg | Freq: Two times a day (BID) | INTRAVENOUS | Status: DC
  Administered 2020-04-30: 40 mg via INTRAVENOUS
  Filled 2020-04-30: qty 40

## 2020-04-30 MED ORDER — BISACODYL 10 MG RE SUPP
10.0000 mg | Freq: Once | RECTAL | Status: AC
  Administered 2020-04-30: 10 mg via RECTAL
  Filled 2020-04-30: qty 1

## 2020-04-30 MED ORDER — DEXMEDETOMIDINE HCL IN NACL 400 MCG/100ML IV SOLN
0.4000 ug/kg/h | INTRAVENOUS | Status: DC
  Administered 2020-04-30 (×2): 1 ug/kg/h via INTRAVENOUS
  Administered 2020-04-30: 0.4 ug/kg/h via INTRAVENOUS
  Administered 2020-05-01 (×2): 1.2 ug/kg/h via INTRAVENOUS
  Filled 2020-04-30 (×5): qty 100

## 2020-04-30 MED ORDER — SODIUM CHLORIDE 0.9 % IV SOLN
80.0000 mg | Freq: Once | INTRAVENOUS | Status: AC
  Administered 2020-04-30: 01:00:00 80 mg via INTRAVENOUS
  Filled 2020-04-30: qty 80

## 2020-04-30 MED ORDER — CALCIUM GLUCONATE-NACL 2-0.675 GM/100ML-% IV SOLN
2.0000 g | Freq: Once | INTRAVENOUS | Status: AC
  Administered 2020-04-30: 2000 mg via INTRAVENOUS
  Filled 2020-04-30: qty 100

## 2020-04-30 MED ORDER — FENTANYL 2500MCG IN NS 250ML (10MCG/ML) PREMIX INFUSION
50.0000 ug/h | INTRAVENOUS | Status: DC
  Administered 2020-04-30: 100 ug/h via INTRAVENOUS
  Administered 2020-04-30: 200 ug/h via INTRAVENOUS
  Filled 2020-04-30 (×2): qty 250

## 2020-04-30 MED ORDER — ENOXAPARIN SODIUM 40 MG/0.4ML ~~LOC~~ SOLN
40.0000 mg | SUBCUTANEOUS | Status: DC
  Administered 2020-05-01 – 2020-05-16 (×16): 40 mg via SUBCUTANEOUS
  Filled 2020-04-30 (×16): qty 0.4

## 2020-04-30 MED ORDER — PANTOPRAZOLE SODIUM 40 MG IV SOLR: 40.0000 mg | Freq: Two times a day (BID) | INTRAVENOUS | Status: DC

## 2020-04-30 MED ORDER — FUROSEMIDE 10 MG/ML IJ SOLN
40.0000 mg | Freq: Two times a day (BID) | INTRAMUSCULAR | Status: DC
  Administered 2020-04-30 (×2): 40 mg via INTRAVENOUS
  Filled 2020-04-30 (×2): qty 4

## 2020-04-30 MED ORDER — PHENYLEPHRINE HCL-NACL 10-0.9 MG/250ML-% IV SOLN
25.0000 ug/min | INTRAVENOUS | Status: DC
  Administered 2020-04-30: 40 ug/min via INTRAVENOUS
  Administered 2020-04-30: 25 ug/min via INTRAVENOUS
  Administered 2020-05-01: 40 ug/min via INTRAVENOUS
  Filled 2020-04-30 (×4): qty 250

## 2020-04-30 MED ORDER — SODIUM CHLORIDE 0.9 % IV SOLN
250.0000 mL | INTRAVENOUS | Status: DC
  Administered 2020-04-30 – 2020-06-12 (×6): 250 mL via INTRAVENOUS

## 2020-04-30 MED ORDER — PHENYLEPHRINE HCL-NACL 10-0.9 MG/250ML-% IV SOLN: 0.0000 ug/min | INTRAVENOUS | Status: DC

## 2020-04-30 MED ORDER — POTASSIUM CHLORIDE 10 MEQ/100ML IV SOLN
10.0000 meq | INTRAVENOUS | Status: AC
  Administered 2020-04-30 (×6): 10 meq via INTRAVENOUS
  Filled 2020-04-30 (×6): qty 100

## 2020-04-30 NOTE — Progress Notes (Signed)
Patient change in status- new coffee ground emesis/drainage observed during turning this shift. [facility] provider notified. Protonix infusion initiated and labs drawn.  Critical potassium notification made.  GI coverage- [place] paged x 2 for notification per Elink recommendations. Awaiting response.

## 2020-04-30 NOTE — Progress Notes (Signed)
CRITICAL VALUE ALERT  Critical Value:  Potassium 2.7  Date & Time Notied:  0328  Provider Notified: [facility]  Orders Received/Actions taken:

## 2020-04-30 NOTE — Progress Notes (Signed)
Initial Nutrition Assessment  RD working remotely.  DOCUMENTATION CODES:   Not applicable  INTERVENTION:   If pt unable to be extubated, recommend obtaining enteral access and initiating tube feeds: - Start Vital AF 1.2 @ 25 ml/hr and increase rate by 10 ml q 8 hours to goal rate of 65 ml/hr (1560 ml/day) - ProSource TF 45 ml BID  Recommended tube feeding regimen at goal would provide 1952 kcal, 139 grams of protein, and 1265 ml of H2O.   - If unable to obtain enteral access and pt remains intubated, recommend TPN  NUTRITION DIAGNOSIS:   Inadequate oral intake related to acute illness as evidenced by NPO status.  GOAL:   Provide needs based on ASPEN/SCCM guidelines  MONITOR:   Vent status, Labs, Weight trends, I & O's  REASON FOR ASSESSMENT:   Ventilator    ASSESSMENT:   64 year old male with PMH of HTN, ERCP on 8/18 s/p biliary stent exchange for persistent postoperative bile leak after recent cholecystectomy with abscess s/- percutaneous drainage. CT abdomen/pelvis showing acute pancreatitis. Pt admitted with severe sepsis secondary to post-ERCP pancreatitis.   8/21 - rapid response due to atrial fibrillation and WOB 8/22 - intubated  Pt with acute hypoxic respiratory failure secondary to volume overload during aggressive fluid resuscitation. Plan was for extubation today; however, pt went into respiratory distress and had episode of desaturation when placed on SBT.  Noted pt with coffee-ground emesis this AM. Per GI note, pt now with ileus in addition to post-ERCP pancreatitis.  Discussed pt with RN. Pt without enteral access at this time. Per RN, pt likely to be extubated tomorrow. RD will leave TF recommendations for use if pt unable to be extubated. If unable to obtain enteral access for tube feeds, recommend TPN.  Noted pt with generalized edema per RN edema assessment.  Patient is currently intubated on ventilator support MV: 12.4 L/min Temp (24hrs),  Avg:99.4 F (37.4 C), Min:98.5 F (36.9 C), Max:100.6 F (38.1 C) BP (cuff): 75/57  MAP (cuff): 64   Drips: Propofol: off this PM Precedex: 21.3 ml/hr Fentanyl: 20 ml/hr NS: 10 ml/hr  Medications reviewed and include: Lasix, SSI q 4 hours, IV protonix, IV abx  Labs reviewed: potassium 2.7, ionized calcium 0.96 CBG's: 146-172 x 24 hours  UOP: 4350 ml x 24 hours Right JP drain: 55 ml x 24 hours I/O's: -1.3 L since admit  NUTRITION - FOCUSED PHYSICAL EXAM:  Unable to complete at this time. RD working remotely.  Diet Order:   Diet Order            Diet NPO time specified Except for: Ice Chips  Diet effective now                 EDUCATION NEEDS:   No education needs have been identified at this time  Skin:  Skin Assessment: Skin Integrity Issues: Incisions: closed abdomen  Last BM:  no documented BM  Height:   Ht Readings from Last 1 Encounters:  04/27/20 5\' 9"  (1.753 m)    Weight:   Wt Readings from Last 1 Encounters:  04/30/20 85.3 kg    Ideal Body Weight:  72.7 kg  BMI:  Body mass index is 27.77 kg/m.  Estimated Nutritional Needs:   Kcal:  1991  Protein:  125-150 grams  Fluid:  >/= 2.0 L    Gaynell Face, MS, RD, LDN Inpatient Clinical Dietitian Please see AMiON for contact information.

## 2020-04-30 NOTE — Progress Notes (Signed)
Paged Dr. Annamaria Boots from interventional radiology for orders to regularly flush JP drain. Page not returned at this time  Will continue to monitor.   Dewaine Oats, RN

## 2020-04-30 NOTE — Progress Notes (Signed)
PULMONARY / CRITICAL CARE MEDICINE   NAME:  Adam Fowler, MRN:  [id number], DOB:  Mar 25, 1956, LOS: 3 ADMISSION DATE:  04/26/2020, CONSULTATION DATE:  04/28/20 REFERRING MD:  Dia Crawford, CHIEF COMPLAINT: Respiratory Failure    ASSESSMENT AND PLAN   Acute Hypoxic Respiratory Failure 2/2 volume overload during aggressive fluid resuscitation to treat his pancreatitis. -Mechanical ventilation w/ CPAP and pressure support ventilation as noted above - Continue diuresis with Lasix 40 mg IV q12h - Plan for extubation later today once propofol is weaned  New onset paroxysmal atrial fibrillation with RVR, converted to sinus rhythm. CHADSVASC score of 1, does not require therapeutic Lovenox at this time. Will continue ASA 81 mg therapy and provide prophylactic dose of Lovenox. - ASA 81 mg QD - Lovenox 40 mg daily for DVT prophylaxis  - Replete electrolytes as needed   Acute pancreatitis s/p ERCP with concern for intraabdominal infection so broad-spectrum abx were initiated. - Continue Zosyn for full 5 day course, discontinue vanc given negative MRSA PCR screen - Bloody emesis unlikely given unchanged Hgb and no further episodes, we will continue to monitor - Continue PPI  SUMMARY OF TODAY'S PLAN:  - Plan for extubation  - Initiated fentanyl drip to wean off propofol - Discontinue vancomycin - Changed Lovenox from therapeutic dosage of 90 mg q12h to prophylactic dosage of 40 mg qd.  BRIEF HISTORY:    Adam Fowler a 64 y.o.male with a past medical history of HTN, nephrolithiasis s/p ERCP on 8/18 with biliary stent exchange for persistent postoperative bile leak after recent cholecystectomy with abscess status post percutaneous drainage who subsequently developed hypoxic respiratory failure. SIGNIFICANT EVENTS:  -New onset a-fib with RVR STUDIES:   CT Angiography: 1. No pulmonary embolus or acute aortic syndrome. 2. Intermediate sized pleural effusions with bilateral upper lobe interstitial  opacities and basilar atelectasis. This may indicate pulmonary edema. XR Abdomen: 1. Transesophageal tube tip terminates at the GE junction with the side port in the distal thoracic esophagus. Recommend advancing approximately 10-15 cm for optimal functioning. 2. Right upper quadrant biliary stent and surgical drain in similar position to comparison CT. 3. Multiple air distended loops of bowel in the upper abdomen compatible with probable ileus. 4. Ovoid, centrally lucent structure projecting over the mid abdomen, possibly external to the patient. Correlate with visual inspection. 5. Atelectatic changes and effusions in the lung bases.  CULTURES:  Blood cultures: No growth a 4 days MRSA PCR Screen: Negative ANTIBIOTICS:  IV Zosyn q8h LINES/TUBES:  4 PIVs CONSULTANTS:  GI SUBJECTIVE:  Continued to diurese well overnight, net negative 1L  One episode of reported "coffee ground" emesis, IV PPI given and labs collected, GI contacted, no further episodes with no change in Hgb.  CONSTITUTIONAL: BP 115/70   Pulse 86   Temp 98.6 F (37 C) (Axillary)   Resp 16   Ht 5\' 9"  (1.753 m)   Wt 85.3 kg   SpO2 94%   BMI 27.77 kg/m   I/O last 3 completed shifts: In: 4223.9 [I.V.:1768.6; IV Piggyback:2455.2] Out: 5825 [Urine:5750; Emesis/NG output:20; Drains:55]   Vent Mode: CPAP;PSV FiO2 (%):  [40 %-50 %] 40 % Set Rate:  [20 bmp] 20 bmp Vt Set:  [420 mL] 420 mL PEEP:  [5 cmH20-10 cmH20] 5 cmH20 Pressure Support:  [8 cmH20] 8 cmH20 Plateau Pressure:  [20 cmH20-22 cmH20] 20 cmH20  PHYSICAL EXAM: General:  Chronically ill-appearing, orally intubated Neuro:  Restless. Pupils 3 mm bilateral reactive to light HEENT:  NCAT, eyes anicteric. ETT  place Cardiovascular: Difficult to appreciate hearts sounds Lungs: Bilateral crackles all over, no wheezes Abdomen: Tense abdomen, non-tender, bowel sounds present Extremities: No edema Skin: No rash  RESOLVED PROBLEM LIST  n/a  Best  Practice / Goals of Care / Disposition.   DVT PROPHYLAXIS: Lovenox 40 mg QD NUTRITION: NPO MOBILITY: Immobile  GOALS OF CARE: Full Code FAMILY DISCUSSIONS: n/a DISPOSITION ICU  LABS  Glucose Recent Labs  Lab 04/29/20 1145 04/29/20 1520 04/29/20 1947 04/29/20 2340 04/30/20 0410 04/30/20 0740  GLUCAP 149* 183* 155* 172* 146* 150*    BMET Recent Labs  Lab 04/29/20 0202 04/29/20 0412 04/29/20 2341 04/30/20 0125 04/30/20 0241  NA 142   < > 142 142 143  K 2.8*   < > 2.9* 2.7* 2.7*  CL 108  --  106 104  --   CO2 18*  --  23 22  --   BUN 29*  --  26* 27*  --   CREATININE 1.15  --  1.16 1.09  --   GLUCOSE 153*  --  176* 167*  --    < > = values in this interval not displayed.    Liver Enzymes Recent Labs  Lab 04/28/20 0016 04/29/20 0202 04/30/20 0125  AST 69* 47* 33  ALT 34 29 24  ALKPHOS 47 51 59  BILITOT 3.2* 2.4* 1.5*  ALBUMIN 2.4* 2.6* 2.3*    Electrolytes Recent Labs  Lab 04/29/20 0202 04/29/20 2341 04/30/20 0125  CALCIUM 6.2* 7.2* 7.1*  MG 2.4 2.2 2.2  PHOS 1.9*  --  2.6    CBC Recent Labs  Lab 04/29/20 0202 04/29/20 0412 04/30/20 0125 04/30/20 0125 04/30/20 0241 04/30/20 0446 04/30/20 0710  WBC 16.3*  --  16.8*  --   --   --  17.1*  HGB 13.0   < > 13.1   < > 12.2* 12.8* 12.4*  HCT 40.0   < > 39.8   < > 36.0* 38.8* 38.2*  PLT 156  --  165  --   --   --  151   < > = values in this interval not displayed.    ABG Recent Labs  Lab 04/29/20 0412 04/29/20 0835 04/30/20 0241  PHART 7.258* 7.379 7.399  PCO2ART 49.1* 37.6 40.9  PO2ART 85 66.8* 132*    Coag's No results for input(s): APTT, INR in the last 168 hours.  Sepsis Markers Recent Labs  Lab 04/27/20 0900 04/28/20 0016 04/28/20 0804 04/29/20 0202 04/30/20 0125  LATICACIDVEN 3.1* 2.4* 1.5  --   --   PROCALCITON  --   --  4.82 4.16 4.07    Cardiac Enzymes No results for input(s): TROPONINI, PROBNP in the last 168 hours.  PAST MEDICAL HISTORY :   He  has a past  medical history of History of kidney stones, Hypertension, and Insomnia.  PAST SURGICAL HISTORY:  He  has a past surgical history that includes Arthroscopic repair ACL (Bilateral); ERCP (N/A, 01/21/2020); pancreatic stent placement (01/21/2020); Cholecystectomy (N/A, 01/24/2020); ERCP (N/A, 01/23/2020); Sphincterotomy (01/23/2020); biliary stent placement (01/23/2020); pancreatic stent placement (01/23/2020); IR Radiologist Eval & Mgmt (03/01/2020); Colonoscopy; Endoscopic retrograde cholangiopancreatography (ercp) with propofol (N/A, 03/21/2020); Stent removal (03/21/2020); biliary stent placement (03/21/2020); IR Radiologist Eval & Mgmt (04/10/2020); Endoscopic retrograde cholangiopancreatography (ercp) with propofol (N/A, 04/25/2020); Stent removal (04/25/2020); and biliary stent placement (04/25/2020).  No Known Allergies  No current facility-administered medications on file prior to encounter.   Current Outpatient Medications on File Prior to Encounter  Medication Sig  .  Calcium Carb-Cholecalciferol (CALCIUM+D3 PO) Take 1 tablet by mouth daily.  . Coenzyme Q10 (COQ10) 400 MG CAPS Take 800 mg by mouth in the morning.   . Cyanocobalamin (VITAMIN B-12) 5000 MCG TBDP Take 5,000 mcg by mouth daily.  Marland Kitchen GLUCOSAMINE-CHONDROITIN PO Take 1 tablet by mouth in the morning and at bedtime.   Javier Docker Oil 1000 MG CAPS Take 1,000 mg by mouth in the morning and at bedtime.   Marland Kitchen lisinopril-hydrochlorothiazide (ZESTORETIC) 10-12.5 MG tablet Take 1 tablet by mouth daily.  Edyth Gunnels Oleifera (MORINGA PO) Take 1,000 mg by mouth 2 (two) times daily.  . Multiple Vitamin (MULTIVITAMIN WITH MINERALS) TABS tablet Take 1 tablet by mouth daily.  . ondansetron (ZOFRAN) 4 MG tablet Take 1 tablet (4 mg total) by mouth every 8 (eight) hours as needed for nausea or vomiting.  Marland Kitchen OVER THE COUNTER MEDICATION Take 1 tablet by mouth See admin instructions. [facility] otc tablet- Take 1 tablet by mouth once a day  . oxyCODONE (OXY IR/ROXICODONE)  5 MG immediate release tablet Take 5 mg by mouth 2 (two) times daily as needed (for pain).   . pravastatin (PRAVACHOL) 80 MG tablet Take 40 mg by mouth in the morning and at bedtime.  . Red Yeast Rice 600 MG CAPS Take 1,200 mg by mouth at bedtime.   . sertraline (ZOLOFT) 100 MG tablet Take 100 mg by mouth daily.  . TURMERIC PO Take 1,500 mg by mouth 2 (two) times daily.  Marland Kitchen zolpidem (AMBIEN) 5 MG tablet Take 5 mg by mouth at bedtime.  Marland Kitchen aspirin EC 81 MG tablet Take 81 mg by mouth 2 (two) times daily. (Patient not taking: Reported on 04/26/2020)  . Calcium Carb-Cholecalciferol (CALCIUM 1000 + D PO) Take 1 tablet by mouth daily. (Patient not taking: Reported on 04/26/2020)  . ibuprofen (ADVIL) 200 MG tablet Take 400-600 mg by mouth every 6 (six) hours as needed for headache or moderate pain.    FAMILY HISTORY:   His family history is negative for Colon cancer and Stomach cancer.  SOCIAL HISTORY:  He  reports that he has never smoked. He has never used smokeless tobacco. He reports previous alcohol use. He reports that he does not use drugs.   Dortha Kern, MS4

## 2020-04-30 NOTE — Telephone Encounter (Signed)
I also called on 8/19. I spoke with the patient's wife. The patient unfortunately had left after his labs were drawn. Significant concern for pancreatitis and elevation in Tb and with leukocytosis. I had tried to get an Inpatient bed but nothing available for 1-2 days as direct admission. I explained the rationale of getting to the hospital ASAP since he was not tolerating great PO intake at home. She stated they would be coming back to the hopsital, their preference and mine, in [place] since [place] is not accepting transfers currently or it could be difficult to get in. They understand and likely will call EMS to get to the hospital. Inpatient GI team aware of his pending arrival.   Justice Britain, MD [facility] Gastroenterology Advanced Endoscopy Office # [phone]

## 2020-04-30 NOTE — Progress Notes (Signed)
Dr. Lyndel SafeVelora Heckler returned page regarding patient status change-coffee ground emesis/drainage.  Ordered to draw hemoglobin/hematocrit now and Q8, leave out OG, and continue protonix as ordered.  Holding Lovenox 0600 dose per Elink recommendations as well.

## 2020-04-30 NOTE — Progress Notes (Addendum)
Daily Rounding Note  04/30/2020, 8:21 AM  LOS: 3 days   SUBJECTIVE:   Chief complaint: post ERCP pancreatitis.  Ileus.  CGE    Yesterday the NG fell out vs became malpositioned.  Staff unable to replace tube due to physical impediment.  Later had CG material noted in oral cavity. lovenox on hold.    Remains on vent, on PPI, amiodarone, precedex, propofol drips.  Phenylephrine dc'd  Receiving potassium runs.     OBJECTIVE:         Vital signs in last 24 hours:    Temp:  [98.6 F (37 C)-100.7 F (38.2 C)] 98.6 F (37 C) (08/23 0743) Pulse Rate:  [84-102] 86 (08/23 0800) Resp:  [14-24] 16 (08/23 0800) BP: (89-126)/(59-79) 115/70 (08/23 0800) SpO2:  [94 %-98 %] 94 % (08/23 0800) FiO2 (%):  [40 %-50 %] 40 % (08/23 0735) Weight:  [85.3 kg] 85.3 kg (08/23 0412) Last BM Date:  (PTA to medical ICU- last doc 04/25/20) Filed Weights   04/26/20 1208 04/27/20 1547 04/30/20 0412  Weight: 81.6 kg 88.5 kg 85.3 kg   General: sedated on vent   Heart: RRR, rate in low 100s Chest: clear in front.  No labored breathing on vent.  Abdomen: slightly tense.  Quiet.  NT  Extremities: no CCE Neuro/Psych:  Sedated.    Intake/Output from previous day: 08/22 0701 - 08/23 0700 In: 3399.8 [I.V.:1341.9; IV Piggyback:2057.9] Out: 4425 [Urine:4350; Emesis/NG output:20; Drains:55]  Intake/Output this shift: Total I/O In: 167.6 [I.V.:61.1; IV Piggyback:106.5] Out: -   Lab Results: Recent Labs    04/29/20 0202 04/29/20 0412 04/30/20 0125 04/30/20 0125 04/30/20 0241 04/30/20 0446 04/30/20 0710  WBC 16.3*  --  16.8*  --   --   --  17.1*  HGB 13.0   < > 13.1   < > 12.2* 12.8* 12.4*  HCT 40.0   < > 39.8   < > 36.0* 38.8* 38.2*  PLT 156  --  165  --   --   --  151   < > = values in this interval not displayed.   BMET Recent Labs    04/29/20 0202 04/29/20 0412 04/29/20 2341 04/30/20 0125 04/30/20 0241  NA 142   < > 142 142 143    K 2.8*   < > 2.9* 2.7* 2.7*  CL 108  --  106 104  --   CO2 18*  --  23 22  --   GLUCOSE 153*  --  176* 167*  --   BUN 29*  --  26* 27*  --   CREATININE 1.15  --  1.16 1.09  --   CALCIUM 6.2*  --  7.2* 7.1*  --    < > = values in this interval not displayed.   LFT Recent Labs    04/28/20 0016 04/29/20 0202 04/30/20 0125  PROT 4.9* 5.4* 5.5*  ALBUMIN 2.4* 2.6* 2.3*  AST 69* 47* 33  ALT 34 29 24  ALKPHOS 47 51 59  BILITOT 3.2* 2.4* 1.5*    Studies/Results: DG Abd 1 View  Result Date: 04/29/2020 CLINICAL DATA:  OG tube placement EXAM: ABDOMEN - 1 VIEW COMPARISON:  CT 04/28/2020 FINDINGS: Transesophageal tube tip terminates at the GE junction with the side port in the distal thoracic esophagus. A right upper stepped in a pigtail catheter is seen as well as a biliary stent in the right upper quadrant. Rounded centrally lucent radiodensity  projects over the mid abdomen, nonspecific and likely external to the patient. Additional telemetry leads overlie the midline abdomen. Multiple air distended loops of bowel in the upper abdomen compatible with probable ileus. Atelectatic changes and effusions present in the lung bases. Osseous structures are unremarkable. IMPRESSION: 1. Transesophageal tube tip terminates at the GE junction with the side port in the distal thoracic esophagus. Recommend advancing approximately 10-15 cm for optimal functioning. 2. Right upper quadrant biliary stent and surgical drain in similar position to comparison CT. 3. Multiple air distended loops of bowel in the upper abdomen compatible with probable ileus. 4. Ovoid, centrally lucent structure projecting over the mid abdomen, possibly external to the patient. Correlate with visual inspection. 5. Atelectatic changes and effusions in the lung bases. These results will be called to the ordering clinician or representative by the Radiologist Assistant, and communication documented in the PACS or [employer].  Electronically Signed   By: Lovena Le M.D.   On: 04/29/2020 04:01   CT ANGIO CHEST PE W OR WO CONTRAST  Result Date: 04/29/2020 CLINICAL DATA:  PE suspected, high prob EXAM: CT ANGIOGRAPHY CHEST WITH CONTRAST TECHNIQUE: Multidetector CT imaging of the chest was performed using the standard protocol during bolus administration of intravenous contrast. Multiplanar CT image reconstructions and MIPs were obtained to evaluate the vascular anatomy. CONTRAST:  146m OMNIPAQUE IOHEXOL 350 MG/ML SOLN COMPARISON:  None. FINDINGS: Cardiovascular: Contrast injection is sufficient to demonstrate satisfactory opacification of the pulmonary arteries to the segmental level. There is no pulmonary embolus or evidence of right heart strain. The size of the main pulmonary artery is normal. Heart size is normal, with no pericardial effusion. The course and caliber of the aorta are normal. There is no atherosclerotic calcification. Opacification decreased due to pulmonary arterial phase contrast bolus timing. Mediastinum/Nodes: No mediastinal, hilar or axillary lymphadenopathy. Normal visualized thyroid. Thoracic esophageal course is normal. Lungs/Pleura: Intermediate sized pleural effusions. Bilateral upper lobe interstitial opacities and basilar atelectasis. Upper Abdomen: Contrast bolus timing is not optimized for evaluation of the abdominal organs. The visualized portions of the organs of the upper abdomen are normal. Musculoskeletal: No chest wall abnormality. No bony spinal canal stenosis. Review of the MIP images confirms the above findings. IMPRESSION: 1. No pulmonary embolus or acute aortic syndrome. 2. Intermediate sized pleural effusions with bilateral upper lobe interstitial opacities and basilar atelectasis. This may indicate pulmonary edema. Electronically Signed   By: KUlyses JarredM.D.   On: 04/29/2020 06:34   DG Chest Port 1 View  Result Date: 04/29/2020 CLINICAL DATA:  Endotracheal tube placement EXAM:  PORTABLE CHEST 1 VIEW COMPARISON:  04/28/2020 FINDINGS: Endotracheal tube tip at the level of the clavicular heads. There is shallow lung inflation with greater than right airspace opacities. Small pleural effusions. IMPRESSION: 1. Endotracheal tube tip at the level of the clavicular heads. 2. Bilateral airspace opacities, which may indicate multifocal infection or pulmonary edema. Electronically Signed   By: KUlyses JarredM.D.   On: 04/29/2020 00:42   DG CHEST PORT 1 VIEW  Result Date: 04/28/2020 CLINICAL DATA:  Shortness of breath EXAM: PORTABLE CHEST 1 VIEW COMPARISON:  04/27/2020 and prior radiographs FINDINGS: This is a low volume study. Cardiomediastinal silhouette is unchanged. Bilateral pleural effusions and bibasilar atelectasis noted. Mild interstitial prominence is noted which may represent interstitial edema. No pneumothorax. IMPRESSION: Bilateral pleural effusions with bibasilar atelectasis and possible mild interstitial pulmonary edema. Electronically Signed   By: JMargarette CanadaM.D.   On: 04/28/2020 15:10  ECHOCARDIOGRAM COMPLETE  Result Date: 04/29/2020 IMPRESSIONS  1. Left ventricular ejection fraction, by estimation, is 60 to 65%. The left ventricle has normal function. The left ventricle has no regional wall motion abnormalities. There is mild left ventricular hypertrophy. Left ventricular diastolic parameters are consistent with Grade I diastolic dysfunction (impaired relaxation).  2. Right ventricular systolic function is normal. The right ventricular size is normal. There is normal pulmonary artery systolic pressure.  3. The mitral valve is normal in structure. No evidence of mitral valve regurgitation.  4. The aortic valve is normal in structure. Aortic valve regurgitation is not visualized.  5. The inferior vena cava is normal in size with greater than 50% respiratory variability, suggesting right atrial pressure of 3 mmHg.  6. Technically very limited study due to poor sound wave  transmission.  Electronically signed by Glori Bickers MD Signature Date/Time: 04/29/2020/1:17:36 PM    Final     Scheduled Meds: . chlorhexidine gluconate (MEDLINE KIT)  15 mL Mouth Rinse BID  . Chlorhexidine Gluconate Cloth  6 each Topical Daily  . enoxaparin (LOVENOX) injection  90 mg Subcutaneous Q12H  . furosemide  40 mg Intravenous Q12H  . insulin aspart  0-15 Units Subcutaneous Q4H  . mouth rinse  15 mL Mouth Rinse 10 times per day  . [START ON 05/03/2020] pantoprazole  40 mg Intravenous Q12H   Continuous Infusions: . sodium chloride 250 mL (04/30/20 0813)  . amiodarone 30 mg/hr (04/30/20 0800)  . calcium gluconate    . methocarbamol (ROBAXIN) IV    . pantoprozole (PROTONIX) infusion 8 mg/hr (04/30/20 0800)  . phenylephrine (NEO-SYNEPHRINE) Adult infusion Stopped (04/29/20 0929)  . piperacillin-tazobactam (ZOSYN)  IV 12.5 mL/hr at 04/30/20 0800  . potassium chloride 10 mEq (04/30/20 0803)  . propofol (DIPRIVAN) infusion 65 mcg/kg/min (04/30/20 0800)   PRN Meds:.HYDROmorphone (DILAUDID) injection, methocarbamol (ROBAXIN) IV, metoprolol tartrate, ondansetron    ASSESMENT:   *  Post ERCP pancreatitis w ileus. Post op bile leak, biloma/abscess.   5/15 ERCP/stent on for cholangitis, biliary pancreatitis.   Plastic stent placed to ventral duct of pancreatic head.    5/17 ERCP w sphincterotomy, papillary diverticulum, stent had migrated, Pancreatic stent replaced, challenging procedure 5/18 Lap chole.   7/14 ERCP for new bile leak: plastic biliary stent placed.   8/18 ERCP w ongoing bile leak, new covered metal biliary stent placed.  NG tube in place. Ileus per xray 8/22.   WBCs overall improved.  Lipase normalized.  On zosyn.    *   Hypoxic resp failure. Intubated on vent.   bil pleural effusions, upper lobe opacities, basiliar atx, ? pulm edema per imaging.   Vanc, zosyn in place.    *   CGE.  Limited.  Occurring after attempt to replace NGT, suspect esophageal/gastric  trauma vs ulcer.   Day 1 PPI gtt.  Hgb stable.  No PPI etc PTA, Advil PTA (not clear how much/often) Hgb 12.9 >> 13.1 >> 12.4.   As visualized at ERCP last week: no PUD,    *  Anemia.    *   Afib/RVR, converted spontaneously to NSR  *   Hypokalemia.    *   Hyperglycemia.    *   AKI.  Improved.     PLAN   *  Given no stools and limited amounts of CGE, will switch PPI to 40 IV bid.    *  Serial Hgbs.    *  Dr Fuller Plan rounding today.  Not clear pt needs  EGD w limited bleeding and stable Hgb.     Azucena Freed  04/30/2020, 8:21 AM Phone [phone]     Attending Physician Note   I have taken an interval history, reviewed the chart and examined the patient. I agree with the Advanced Practitioner's note, impression and recommendations.   Bile leak post several biliary stents placed, now with a covered metal biliary stent Post ERCP pancreatitis, resolving Acute resp failure, suspected volume overload, intubated Small volume CGE with stable Hgb after attempt to replace NGT   Continue current mgmt. No plans for EGD at this time given minimal bleeding.  Trend CBC, hold lovenox for now and monitor for recurrent GI bleeding.    Lucio Edward, MD [facility]

## 2020-04-30 NOTE — Progress Notes (Addendum)
eLink Physician-Brief Progress Note Patient Name: Adam Fowler DOB: [birth year] MRN: [id number]   Date of Service  04/30/2020  HPI/Events of Note  RN notified us of a single episode of what appeared to be coffee ground emesis. Does not have a NG/OG as it could not be placed yesterday. ON vent, no tachycardia, no hypotension.  eICU Interventions  Send AM labs now Stool occult blood  IV PPI infusion Asked that GI be updated after labs come in , they are following the case In addition, also on lovenox for A fib (currently in SR, will need to hold the 6 am dose until we figure out if really bleeding)  D/W Rn and asked we be called back once labs and GI discussion occurs      Intervention Category Major Interventions: Hemorrhage - evaluation and management  Dominque Levandowski G [place] 04/30/2020, 1:18 AM   4.40 am Notified now of AM labs Stable h/h K is 2.7 and normal magnesium Has had low potassium since yesterday and is on lasix Replete with 60 meq IV Kcl Would check this after infusion completed Repeat CBC in 6 hours Hold the AM dose of lovenox which is due at 6 am - will defer to day team after stability of CBC confirmed and GI evaluation done

## 2020-05-01 ENCOUNTER — Inpatient Hospital Stay (HOSPITAL_COMMUNITY): Payer: 59

## 2020-05-01 DIAGNOSIS — K839 Disease of biliary tract, unspecified: Secondary | ICD-10-CM

## 2020-05-01 DIAGNOSIS — R1010 Upper abdominal pain, unspecified: Secondary | ICD-10-CM

## 2020-05-01 DIAGNOSIS — K567 Ileus, unspecified: Secondary | ICD-10-CM

## 2020-05-01 LAB — COMPREHENSIVE METABOLIC PANEL
ALT: 23 U/L (ref 0–44)
AST: 37 U/L (ref 15–41)
Albumin: 2 g/dL — ABNORMAL LOW (ref 3.5–5.0)
Alkaline Phosphatase: 65 U/L (ref 38–126)
Anion gap: 14 (ref 5–15)
BUN: 35 mg/dL — ABNORMAL HIGH (ref 8–23)
CO2: 22 mmol/L (ref 22–32)
Calcium: 7.6 mg/dL — ABNORMAL LOW (ref 8.9–10.3)
Chloride: 109 mmol/L (ref 98–111)
Creatinine, Ser: 1.29 mg/dL — ABNORMAL HIGH (ref 0.61–1.24)
GFR calc Af Amer: >60 mL/min (ref >60)
GFR calc non Af Amer: 58 mL/min — ABNORMAL LOW (ref >60)
Glucose, Bld: 130 mg/dL — ABNORMAL HIGH (ref 70–99)
Potassium: 3.1 mmol/L — ABNORMAL LOW (ref 3.5–5.1)
Sodium: 145 mmol/L (ref 135–145)
Total Bilirubin: 1.4 mg/dL — ABNORMAL HIGH (ref 0.3–1.2)
Total Protein: 5.3 g/dL — ABNORMAL LOW (ref 6.5–8.1)

## 2020-05-01 LAB — CULTURE, BLOOD (ROUTINE X 2)
Culture: NO GROWTH
Culture: NO GROWTH
Special Requests: ADEQUATE

## 2020-05-01 LAB — CBC WITH DIFFERENTIAL/PLATELET
Abs Immature Granulocytes: 0.97 10*3/uL — ABNORMAL HIGH (ref 0.00–0.07)
Basophils Absolute: 0 10*3/uL (ref 0.0–0.1)
Basophils Relative: 0 %
Eosinophils Absolute: 0.2 10*3/uL (ref 0.0–0.5)
Eosinophils Relative: 1 %
HCT: 38.5 % — ABNORMAL LOW (ref 39.0–52.0)
Hemoglobin: 12.5 g/dL — ABNORMAL LOW (ref 13.0–17.0)
Immature Granulocytes: 5 %
Lymphocytes Relative: 4 %
Lymphs Abs: 1 10*3/uL (ref 0.7–4.0)
MCH: 29.4 pg (ref 26.0–34.0)
MCHC: 32.5 g/dL (ref 30.0–36.0)
MCV: 90.6 fL (ref 80.0–100.0)
Monocytes Absolute: 1.2 10*3/uL — ABNORMAL HIGH (ref 0.1–1.0)
Monocytes Relative: 5 %
Neutro Abs: 18.4 10*3/uL — ABNORMAL HIGH (ref 1.7–7.7)
Neutrophils Relative %: 85 %
Platelets: 169 10*3/uL (ref 150–400)
RBC: 4.25 MIL/uL (ref 4.22–5.81)
RDW: 16 % — ABNORMAL HIGH (ref 11.5–15.5)
WBC: 21.7 10*3/uL — ABNORMAL HIGH (ref 4.0–10.5)
nRBC: 0 % (ref 0.0–0.2)

## 2020-05-01 LAB — LIPASE, BLOOD: Lipase: 19 U/L (ref 11–51)

## 2020-05-01 LAB — HEMOGLOBIN AND HEMATOCRIT, BLOOD
HCT: 41.9 % (ref 39.0–52.0)
Hemoglobin: 13.4 g/dL (ref 13.0–17.0)

## 2020-05-01 LAB — MAGNESIUM: Magnesium: 2 mg/dL (ref 1.7–2.4)

## 2020-05-01 LAB — GLUCOSE, CAPILLARY
Glucose-Capillary: 135 mg/dL — ABNORMAL HIGH (ref 70–99)
Glucose-Capillary: 125 mg/dL — ABNORMAL HIGH (ref 70–99)
Glucose-Capillary: 116 mg/dL — ABNORMAL HIGH (ref 70–99)
Glucose-Capillary: 156 mg/dL — ABNORMAL HIGH (ref 70–99)
Glucose-Capillary: 198 mg/dL — ABNORMAL HIGH (ref 70–99)

## 2020-05-01 LAB — PHOSPHORUS: Phosphorus: 3.5 mg/dL (ref 2.5–4.6)

## 2020-05-01 MED ORDER — POLYVINYL ALCOHOL 1.4 % OP SOLN
1.0000 [drp] | OPHTHALMIC | Status: DC | PRN
  Filled 2020-05-01: qty 15

## 2020-05-01 MED ORDER — FUROSEMIDE 10 MG/ML IJ SOLN
40.0000 mg | Freq: Two times a day (BID) | INTRAMUSCULAR | Status: DC
  Administered 2020-05-01 – 2020-05-03 (×6): 40 mg via INTRAVENOUS
  Filled 2020-05-01 (×6): qty 4

## 2020-05-01 MED ORDER — PANTOPRAZOLE SODIUM 40 MG IV SOLR
40.0000 mg | Freq: Every day | INTRAVENOUS | Status: DC
  Administered 2020-05-02 – 2020-05-05 (×4): 40 mg via INTRAVENOUS
  Filled 2020-05-01 (×4): qty 40

## 2020-05-01 MED ORDER — PIPERACILLIN-TAZOBACTAM 3.375 G IVPB
3.3750 g | Freq: Three times a day (TID) | INTRAVENOUS | Status: AC
  Administered 2020-05-01 – 2020-05-03 (×6): 3.375 g via INTRAVENOUS
  Filled 2020-05-01 (×8): qty 50

## 2020-05-01 MED ORDER — FUROSEMIDE 10 MG/ML IJ SOLN: 40.0000 mg | Freq: Every day | INTRAMUSCULAR | Status: DC

## 2020-05-01 MED ORDER — FENTANYL CITRATE (PF) 100 MCG/2ML IJ SOLN
25.0000 ug | INTRAMUSCULAR | Status: DC | PRN
  Administered 2020-05-01: 25 ug via INTRAVENOUS
  Filled 2020-05-01: qty 2

## 2020-05-01 MED ORDER — ORAL CARE MOUTH RINSE
15.0000 mL | Freq: Two times a day (BID) | OROMUCOSAL | Status: DC
  Administered 2020-05-02 – 2020-06-14 (×81): 15 mL via OROMUCOSAL

## 2020-05-01 MED ORDER — POTASSIUM CHLORIDE 10 MEQ/100ML IV SOLN
10.0000 meq | INTRAVENOUS | Status: AC
  Administered 2020-05-01 (×5): 10 meq via INTRAVENOUS
  Filled 2020-05-01 (×5): qty 100

## 2020-05-01 MED ORDER — LIDOCAINE 5 % EX PTCH
1.0000 | MEDICATED_PATCH | Freq: Every day | CUTANEOUS | Status: DC | PRN
  Administered 2020-05-02 – 2020-05-24 (×7): 1 via TRANSDERMAL
  Filled 2020-05-01 (×8): qty 1

## 2020-05-01 NOTE — Progress Notes (Addendum)
Daily Rounding Note  05/01/2020, 10:14 AM  LOS: 4 days   SUBJECTIVE:   Chief complaint: Pancreatitis, CGE, ileus  Extubated this AM.   No  Further CGE.  Denies nausea. C/O diffuse abdominal discomfort and lower back pain, present PTA. No BMs for more than 24 hours.  Review of systems: Denies chest pain Short of breath Generalized abdominal pain  OBJECTIVE:         Vital signs in last 24 hours:    Temp:  [98.5 F (36.9 C)-100.8 F (38.2 C)] 100.2 F (37.9 C) (08/24 0803) Pulse Rate:  [70-96] 96 (08/24 0800) Resp:  [12-33] 15 (08/24 0900) BP: (76-120)/(56-92) 120/70 (08/24 0900) SpO2:  [86 %-98 %] 96 % (08/24 0800) FiO2 (%):  [40 %-50 %] 40 % (08/24 0800) Weight:  [86.5 kg] 86.5 kg (08/24 0500) Last BM Date:  (PTA) Filed Weights   04/27/20 1547 04/30/20 0412 05/01/20 0500  Weight: 88.5 kg 85.3 kg 86.5 kg   General: Looks frail and chronically/acutely ill but not toxic ENT: Oral mucosa is dry.  There are some dark brownish streaky stains on the tongue. Heart: RRR Chest: Bilateral, diffuse crackles.  Very weak vocal strength, soft voice.  Slight dyspnea with speech. Abdomen: Soft, diffusely tender without guarding or rebound.  Not as distended or tense as yesterday but remains somewhat tight.  Bowel sounds hypoactive but no high-pitched or tinkling/tympanic sounds. Extremities: No peripheral edema.  Feet are cool without mottling Neuro/Psych: Appropriate, alert, anxious.  Intake/Output from previous day: 08/23 0701 - 08/24 0700 In: 2725.3 [I.V.:2066.6; IV Piggyback:658.7] Out: 6063 [Urine:1750; Drains:40]  Intake/Output this shift: Total I/O In: 226.2 [I.V.:157.5; IV Piggyback:68.7] Out: 300 [Urine:300]  Lab Results: Recent Labs    04/30/20 0125 04/30/20 0241 04/30/20 0710 04/30/20 0710 04/30/20 1415 04/30/20 2157 05/01/20 0429  WBC 16.8*  --  17.1*  --   --   --  21.7*  HGB 13.1   < > 12.4*   <  > 12.1* 12.3* 12.5*  HCT 39.8   < > 38.2*   < > 37.6* 37.6* 38.5*  PLT 165  --  151  --   --   --  169   < > = values in this interval not displayed.   BMET Recent Labs    04/29/20 2341 04/29/20 2341 04/30/20 0125 04/30/20 0241 05/01/20 0429  NA 142   < > 142 143 145  K 2.9*   < > 2.7* 2.7* 3.1*  CL 106  --  104  --  109  CO2 23  --  22  --  22  GLUCOSE 176*  --  167*  --  130*  BUN 26*  --  27*  --  35*  CREATININE 1.16  --  1.09  --  1.29*  CALCIUM 7.2*  --  7.1*  --  7.6*   < > = values in this interval not displayed.   LFT Recent Labs    04/29/20 0202 04/30/20 0125 05/01/20 0429  PROT 5.4* 5.5* 5.3*  ALBUMIN 2.6* 2.3* 2.0*  AST 47* 33 37  ALT 29 24 23   ALKPHOS 51 59 65  BILITOT 2.4* 1.5* 1.4*   PT/INR No results for input(s): LABPROT, INR in the last 72 hours. Hepatitis Panel No results for input(s): HEPBSAG, HCVAB, HEPAIGM, HEPBIGM in the last 72 hours.  Studies/Results: DG CHEST PORT 1 VIEW  Result Date: 05/01/2020 CLINICAL DATA:  ARDS, intubation EXAM: PORTABLE  CHEST 1 VIEW COMPARISON:  Portable exam 0800 hours compared to 04/29/2020 FINDINGS: Tip of endotracheal tube 9 mm above carina recommend withdrawal 1-2 cm. Normal heart size and mediastinal contours. BILATERAL pulmonary infiltrates slightly improved in perihilar regions since previous exam. No pleural effusion or pneumothorax. IMPRESSION: Recommend withdrawal of endotracheal tube 1-2 cm. Improved pulmonary infiltrates. Findings called to [facility] RN in 25M ICU on 05/01/2020 at 0821 hours. Electronically Signed   By: Lavonia Dana M.D.   On: 05/01/2020 08:22   ECHOCARDIOGRAM COMPLETE  Result Date: 04/29/2020 IMPRESSIONS  1. Left ventricular ejection fraction, by estimation, is 60 to 65%. The left ventricle has normal function. The left ventricle has no regional wall motion abnormalities. There is mild left ventricular hypertrophy. Left ventricular diastolic parameters are consistent with Grade I diastolic  dysfunction (impaired relaxation).  2. Right ventricular systolic function is normal. The right ventricular size is normal. There is normal pulmonary artery systolic pressure.  3. The mitral valve is normal in structure. No evidence of mitral valve regurgitation.  4. The aortic valve is normal in structure. Aortic valve regurgitation is not visualized.  5. The inferior vena cava is normal in size with greater than 50% respiratory variability, suggesting right atrial pressure of 3 mmHg.  6. Technically very limited study due to poor sound wave transmission. Electronically signed by Glori Bickers MD Signature Date/Time: 04/29/2020/1:17:36 PM    Final    Scheduled Meds: . chlorhexidine gluconate (MEDLINE KIT)  15 mL Mouth Rinse BID  . Chlorhexidine Gluconate Cloth  6 each Topical Daily  . enoxaparin (LOVENOX) injection  40 mg Subcutaneous Q24H  . furosemide  40 mg Intravenous BID  . insulin aspart  0-15 Units Subcutaneous Q4H  . mouth rinse  15 mL Mouth Rinse 10 times per day  . [START ON 05/02/2020] pantoprazole  40 mg Intravenous Daily   Continuous Infusions: . sodium chloride 10 mL/hr at 04/30/20 1835  . sodium chloride 10 mL/hr at 05/01/20 0900  . dexmedetomidine (PRECEDEX) IV infusion Stopped (05/01/20 0743)  . fentaNYL infusion INTRAVENOUS 50 mcg/hr (05/01/20 0900)  . methocarbamol (ROBAXIN) IV    . phenylephrine (NEO-SYNEPHRINE) Adult infusion Stopped (05/01/20 0853)  . piperacillin-tazobactam (ZOSYN)  IV    . potassium chloride 100 mL/hr at 05/01/20 0900   PRN Meds:.fentaNYL, HYDROmorphone (DILAUDID) injection, lidocaine, methocarbamol (ROBAXIN) IV, metoprolol tartrate, ondansetron, polyvinyl alcohol   KUB today without ileus or obstruction.   ASSESMENT:   *  Post ERCP pancreatitis w ileus. Post op bile leak, biloma/abscess.   5/15 ERCP/stent on for cholangitis, biliary pancreatitis.   Plastic stent placed to ventral duct of pancreatic head.    5/17 ERCP w sphincterotomy,  papillary diverticulum, stent had migrated, Pancreatic stent replaced, challenging procedure 5/18 Lap chole.   7/14 ERCP for new bile leak: plastic biliary stent placed.   8/18 ERCP w ongoing bile leak, new covered metal biliary stent placed.  Ileus per xray 8/22.   WBCs overall improved.  Lipase normalized. LFTs normal  On zosyn.    *   Hypoxic resp failure. Intubated 8/22 - 8/24.  CT w bil pleural effusions, opacities, atx: improved on xray today.   Zosyn day 5.    *   CGE.  Limited, resolved.  Occurring after attempt to replace NGT, suspect esophageal/gastric trauma vs ulcer.   PPI gtt. >> bid IV Protonix.   Hgb stable.  No PPI etc PTA, Advil PTA (not clear how much/often) Hgb 12.5   As visualized at ERCP last  week: no PUD or mucosal compromise.     *   AKI.    *  Anemia.  Mild.  MCV normal.    *   Afib/RVR, converted spontaneously to NSR Amiodarone drip discontinued.  *   Hypokalemia.  persists.  Magnesium and phosphorus level okay.  *   Hyperglycemia.    *   AKI.  Improved.      PLAN   *   Continue PPI, Protonix 40 mg daily, can switch to oral formulation when safe for him to resume p.o.  *    Repeat KUB today to assess ileus  *   Try to minimize narcotics.  *   Correct K levels as doing..    *   The Zosyn indication is intra-abdominal infection, currently day 5, does he still need this?    Azucena Freed  05/01/2020, 10:14 AM Phone [phone]  I have discussed the case with the PA, and that is the plan I formulated. I personally interviewed and examined the patient.  He reports feeling "miserable", short of breath, exhausted, generalized abdominal pain, foot cramping.  He successfully extubated earlier today.  He is only had small trials of sips or ice chips so far, and I think I would keep it there until tomorrow. Entire upper abdomen tender, distant bowel sounds.  Lipase normalized, but I suspect he still has significant amount of pancreatic  inflammation.  Ileus has resolved, but his oral tolerance of food and liquids will be slow to return.  When he is consistently awake and protecting airway and passes bedside swallow exam, I would start him on clear liquids tomorrow,, then we will keep reevaluating him.  I would then add some protein calorie supplements and increase to full liquid diet as tolerated.  If he has significant abdominal pain or develops vomiting, he may need to go on TPN.  He will have a prolonged recovery from this severe illness.  LFTs of about normalized, biliary stent is working well and will remain there to allow healing of this complex bile leak.  We will follow.  Total time 35 minutes  Nelida Meuse III Office: [phone]

## 2020-05-01 NOTE — Progress Notes (Signed)
PULMONARY / CRITICAL CARE MEDICINE   NAME:  Adam Fowler, MRN:  [id number], DOB:  [birth year], LOS: 4 ADMISSION DATE:  04/26/2020, CONSULTATION DATE:  04/28/20 REFERRING MD:  Dia Crawford CHIEF COMPLAINT: Respiratory Failure    ASSESSMENT AND PLAN   Acute Hypoxic Respiratory Failure 2/2 volume overload during aggressive fluid resuscitation to treat his pancreatitis. Evaluated on 8/24, RSI of 38, RASS of 0, controlling secretions well, extubated to O2 [place]. Satting in the low 90s on 6L [place]. We will consider BiPAP if he decompensates.  - Continue to titrate O2 [place] as needed with goal O2 sat 90% - Continue diuresis with Lasix 40 mg IV q12h  New onset paroxysmal atrial fibrillation with RVR, converted to sinus rhythm. CHADSVASC score of 1, does not require therapeutic Lovenox at this time. Will continue ASA 81 mg therapy and provide prophylactic dose of Lovenox. - Continue IV amiodarone - Continue metoprolol as Bps tolerate - ASA 81 mg QD - Lovenox 40 mg daily for DVT prophylaxis  - Replete electrolytes as needed   Acute pancreatitis s/p ERCP with concern for intraabdominal infection so broad-spectrum abx were initiated. Completed 5 day course of Zosyn on 8/23. Patient continued to spike fever and WBC trended up slightly overnight. - Continue Zosyn for an additional two days to complete 7 day course  Concern for GI Bleed Concern for possible bloody emesis on 8/23, H&H has remained stable since. No clinical, laboratory, or imaging findings consistent with a bleed on 8/24.  - H&H q8h discontinued, will check CBC daily - Decrease PPI 40 mg IV to QD - Prophylactic Lovenox restarted  Electrolyte Abnormalities Corrected Ca of 9.2 on 8/24. Phos and Mg wnl. Hypokalemia at 3.1 up from 2.7. - Repleted K once again  SUMMARY OF TODAY'S PLAN:  - Extubation completed - Continuing abx therapy for an additional 2 days BRIEF HISTORY:    Adam Fowler a 64 y.o.male with a past medical history of HTN,  nephrolithiasis s/p ERCP on 8/18 with biliary stent exchange for persistent postoperative bile leak after recent cholecystectomy with abscess status post percutaneous drainage who subsequently developed hypoxic respiratory failure. SIGNIFICANT EVENTS:  -New onset a-fib with RVR STUDIES:   CXR: Improved pulmonary infiltrates. CULTURES:  Blood cultures: No growth a 5 days MRSA PCR Screen: Negative ANTIBIOTICS:  none LINES/TUBES:  4 PIVs CONSULTANTS:  GI SUBJECTIVE:  No acute events overnight GI following for concern for possible bleed; concern for ileus as well Patient much more alert on exam today  CONSTITUTIONAL: BP 120/70   Pulse 96   Temp 100.2 F (37.9 C) (Oral)   Resp 15   Ht 5\' 9"  (1.753 m)   Wt 86.5 kg   SpO2 96%   BMI 28.16 kg/m   I/O last 3 completed shifts: In: 3946.7 [I.V.:2743.3; IV Piggyback:1203.5] Out: 5188 [Urine:3300; Emesis/NG output:20; Drains:65]   6L [place] O2 following extubation  PHYSICAL EXAM: General:  Chronically ill-appearing, O2 [place] in place Neuro:  Restless. Pupils 3 mm bilateral reactive to light, able to speak and follow commands post extubation HEENT:  NCAT, eyes anicteric. Cardiovascular: Difficult to appreciate hearts sounds Lungs: Bilateral crackles all over, no wheezes Abdomen: Tense abdomen, non-tender, bowel sounds present Extremities: No edema, warm extremities Skin: No rash  RESOLVED PROBLEM LIST  n/a  Best Practice / Goals of Care / Disposition.   DVT PROPHYLAXIS: Lovenox 40 mg QD NUTRITION: NPO MOBILITY: Immobile  GOALS OF CARE: Full Code FAMILY DISCUSSIONS: n/a DISPOSITION ICU  LABS  Glucose Recent Labs  Lab 04/30/20 1203 04/30/20 1553 04/30/20 2004 04/30/20 2347 05/01/20 0354 05/01/20 0805  GLUCAP 157* 118* 120* 117* 135* 125*    BMET Recent Labs  Lab 04/29/20 2341 04/29/20 2341 04/30/20 0125 04/30/20 0241 05/01/20 0429  NA 142   < > 142 143 145  K 2.9*   < > 2.7* 2.7* 3.1*  CL 106  --  104  --   109  CO2 23  --  22  --  22  BUN 26*  --  27*  --  35*  CREATININE 1.16  --  1.09  --  1.29*  GLUCOSE 176*  --  167*  --  130*   < > = values in this interval not displayed.    Liver Enzymes Recent Labs  Lab 04/29/20 0202 04/30/20 0125 05/01/20 0429  AST 47* 33 37  ALT 29 24 23   ALKPHOS 51 59 65  BILITOT 2.4* 1.5* 1.4*  ALBUMIN 2.6* 2.3* 2.0*    Electrolytes Recent Labs  Lab 04/29/20 0202 04/29/20 0202 04/29/20 2341 04/30/20 0125 05/01/20 0429  CALCIUM 6.2*   < > 7.2* 7.1* 7.6*  MG 2.4   < > 2.2 2.2 2.0  PHOS 1.9*  --   --  2.6 3.5   < > = values in this interval not displayed.    CBC Recent Labs  Lab 04/30/20 0125 04/30/20 0241 04/30/20 0710 04/30/20 0710 04/30/20 1415 04/30/20 2157 05/01/20 0429  WBC 16.8*  --  17.1*  --   --   --  21.7*  HGB 13.1   < > 12.4*   < > 12.1* 12.3* 12.5*  HCT 39.8   < > 38.2*   < > 37.6* 37.6* 38.5*  PLT 165  --  151  --   --   --  169   < > = values in this interval not displayed.    ABG Recent Labs  Lab 04/29/20 0412 04/29/20 0835 04/30/20 0241  PHART 7.258* 7.379 7.399  PCO2ART 49.1* 37.6 40.9  PO2ART 85 66.8* 132*    Coag's No results for input(s): APTT, INR in the last 168 hours.  Sepsis Markers Recent Labs  Lab 04/27/20 0900 04/28/20 0016 04/28/20 0804 04/29/20 0202 04/30/20 0125  LATICACIDVEN 3.1* 2.4* 1.5  --   --   PROCALCITON  --   --  4.82 4.16 4.07    Cardiac Enzymes No results for input(s): TROPONINI, PROBNP in the last 168 hours.  PAST MEDICAL HISTORY :   He  has a past medical history of History of kidney stones, Hypertension, and Insomnia.  PAST SURGICAL HISTORY:  He  has a past surgical history that includes Arthroscopic repair ACL (Bilateral); ERCP (N/A, 01/21/2020); pancreatic stent placement (01/21/2020); Cholecystectomy (N/A, 01/24/2020); ERCP (N/A, 01/23/2020); Sphincterotomy (01/23/2020); biliary stent placement (01/23/2020); pancreatic stent placement (01/23/2020); IR Radiologist Eval  & Mgmt (03/01/2020); Colonoscopy; Endoscopic retrograde cholangiopancreatography (ercp) with propofol (N/A, 03/21/2020); Stent removal (03/21/2020); biliary stent placement (03/21/2020); IR Radiologist Eval & Mgmt (04/10/2020); Endoscopic retrograde cholangiopancreatography (ercp) with propofol (N/A, 04/25/2020); Stent removal (04/25/2020); and biliary stent placement (04/25/2020).  No Known Allergies  No current facility-administered medications on file prior to encounter.   Current Outpatient Medications on File Prior to Encounter  Medication Sig  . Calcium Carb-Cholecalciferol (CALCIUM+D3 PO) Take 1 tablet by mouth daily.  . Coenzyme Q10 (COQ10) 400 MG CAPS Take 800 mg by mouth in the morning.   . Cyanocobalamin (VITAMIN B-12) 5000 MCG TBDP Take 5,000 mcg by mouth daily.  Marland Kitchen  GLUCOSAMINE-CHONDROITIN PO Take 1 tablet by mouth in the morning and at bedtime.   Javier Docker Oil 1000 MG CAPS Take 1,000 mg by mouth in the morning and at bedtime.   Marland Kitchen lisinopril-hydrochlorothiazide (ZESTORETIC) 10-12.5 MG tablet Take 1 tablet by mouth daily.  Edyth Gunnels Oleifera (MORINGA PO) Take 1,000 mg by mouth 2 (two) times daily.  . Multiple Vitamin (MULTIVITAMIN WITH MINERALS) TABS tablet Take 1 tablet by mouth daily.  . ondansetron (ZOFRAN) 4 MG tablet Take 1 tablet (4 mg total) by mouth every 8 (eight) hours as needed for nausea or vomiting.  Marland Kitchen OVER THE COUNTER MEDICATION Take 1 tablet by mouth See admin instructions. [facility] otc tablet- Take 1 tablet by mouth once a day  . oxyCODONE (OXY IR/ROXICODONE) 5 MG immediate release tablet Take 5 mg by mouth 2 (two) times daily as needed (for pain).   . pravastatin (PRAVACHOL) 80 MG tablet Take 40 mg by mouth in the morning and at bedtime.  . Red Yeast Rice 600 MG CAPS Take 1,200 mg by mouth at bedtime.   . sertraline (ZOLOFT) 100 MG tablet Take 100 mg by mouth daily.  . TURMERIC PO Take 1,500 mg by mouth 2 (two) times daily.  Marland Kitchen zolpidem (AMBIEN) 5 MG tablet Take 5 mg by mouth  at bedtime.  Marland Kitchen aspirin EC 81 MG tablet Take 81 mg by mouth 2 (two) times daily. (Patient not taking: Reported on 04/26/2020)  . Calcium Carb-Cholecalciferol (CALCIUM 1000 + D PO) Take 1 tablet by mouth daily. (Patient not taking: Reported on 04/26/2020)  . ibuprofen (ADVIL) 200 MG tablet Take 400-600 mg by mouth every 6 (six) hours as needed for headache or moderate pain.    FAMILY HISTORY:   His family history is negative for Colon cancer and Stomach cancer.  SOCIAL HISTORY:  He  reports that he has never smoked. He has never used smokeless tobacco. He reports previous alcohol use. He reports that he does not use drugs.   Dortha Kern, MS4

## 2020-05-01 NOTE — Procedures (Signed)
Extubation Procedure Note  Patient Details:   Name: Adam Fowler DOB: 24-Jan-1956 MRN: [id number]   Airway Documentation:    Vent end date: 05/01/20 Vent end time: 0925   Evaluation  O2 sats: stable throughout Complications: No apparent complications Patient did tolerate procedure well. Bilateral Breath Sounds: Clear, Diminished   Yes   Pt extubated per physician order. Prior to extubation pt suctioned via ETT and orally. Positive cuff leak prior. Pt then extuabted to 3L nasal cannula. No stridor heard, pt with good cough and able to speak name.   [name] 05/01/2020, 9:30 AM

## 2020-05-01 NOTE — Progress Notes (Signed)
Spoke to the [facility] and [facility] about the appropriateness of accepting this patient to [facility]. BP 158/92 HR 101  RR 31 O2 94 on 6L Pt extubated this afternoon. Currently Typicnic. Will take report and closely monitor

## 2020-05-02 ENCOUNTER — Inpatient Hospital Stay (HOSPITAL_COMMUNITY): Payer: 59

## 2020-05-02 DIAGNOSIS — R112 Nausea with vomiting, unspecified: Secondary | ICD-10-CM

## 2020-05-02 LAB — COMPREHENSIVE METABOLIC PANEL
ALT: 24 U/L (ref 0–44)
AST: 44 U/L — ABNORMAL HIGH (ref 15–41)
Albumin: 2.1 g/dL — ABNORMAL LOW (ref 3.5–5.0)
Alkaline Phosphatase: 87 U/L (ref 38–126)
Anion gap: 16 — ABNORMAL HIGH (ref 5–15)
BUN: 30 mg/dL — ABNORMAL HIGH (ref 8–23)
CO2: 23 mmol/L (ref 22–32)
Calcium: 8.2 mg/dL — ABNORMAL LOW (ref 8.9–10.3)
Chloride: 105 mmol/L (ref 98–111)
Creatinine, Ser: 1.1 mg/dL (ref 0.61–1.24)
GFR calc Af Amer: >60 mL/min (ref >60)
GFR calc non Af Amer: >60 mL/min (ref >60)
Glucose, Bld: 165 mg/dL — ABNORMAL HIGH (ref 70–99)
Potassium: 3.1 mmol/L — ABNORMAL LOW (ref 3.5–5.1)
Sodium: 144 mmol/L (ref 135–145)
Total Bilirubin: 1.6 mg/dL — ABNORMAL HIGH (ref 0.3–1.2)
Total Protein: 5.6 g/dL — ABNORMAL LOW (ref 6.5–8.1)

## 2020-05-02 LAB — CBC WITH DIFFERENTIAL/PLATELET
Abs Immature Granulocytes: 1.3 10*3/uL — ABNORMAL HIGH (ref 0.00–0.07)
Basophils Absolute: 0 10*3/uL (ref 0.0–0.1)
Basophils Relative: 0 %
Eosinophils Absolute: 0.1 10*3/uL (ref 0.0–0.5)
Eosinophils Relative: 0 %
HCT: 40.6 % (ref 39.0–52.0)
Hemoglobin: 12.9 g/dL — ABNORMAL LOW (ref 13.0–17.0)
Immature Granulocytes: 5 %
Lymphocytes Relative: 4 %
Lymphs Abs: 1 10*3/uL (ref 0.7–4.0)
MCH: 28.4 pg (ref 26.0–34.0)
MCHC: 31.8 g/dL (ref 30.0–36.0)
MCV: 89.4 fL (ref 80.0–100.0)
Monocytes Absolute: 1.1 10*3/uL — ABNORMAL HIGH (ref 0.1–1.0)
Monocytes Relative: 4 %
Neutro Abs: 23.4 10*3/uL — ABNORMAL HIGH (ref 1.7–7.7)
Neutrophils Relative %: 87 %
Platelets: 193 10*3/uL (ref 150–400)
RBC: 4.54 MIL/uL (ref 4.22–5.81)
RDW: 16 % — ABNORMAL HIGH (ref 11.5–15.5)
WBC: 26.9 10*3/uL — ABNORMAL HIGH (ref 4.0–10.5)
nRBC: 0 % (ref 0.0–0.2)

## 2020-05-02 LAB — GLUCOSE, CAPILLARY
Glucose-Capillary: 143 mg/dL — ABNORMAL HIGH (ref 70–99)
Glucose-Capillary: 158 mg/dL — ABNORMAL HIGH (ref 70–99)
Glucose-Capillary: 157 mg/dL — ABNORMAL HIGH (ref 70–99)
Glucose-Capillary: 152 mg/dL — ABNORMAL HIGH (ref 70–99)
Glucose-Capillary: 153 mg/dL — ABNORMAL HIGH (ref 70–99)
Glucose-Capillary: 211 mg/dL — ABNORMAL HIGH (ref 70–99)

## 2020-05-02 LAB — TRIGLYCERIDES: Triglycerides: 210 mg/dL — ABNORMAL HIGH (ref <150)

## 2020-05-02 LAB — MAGNESIUM: Magnesium: 2.1 mg/dL (ref 1.7–2.4)

## 2020-05-02 LAB — PHOSPHORUS: Phosphorus: 3.3 mg/dL (ref 2.5–4.6)

## 2020-05-02 LAB — LIPASE, BLOOD: Lipase: 18 U/L (ref 11–51)

## 2020-05-02 MED ORDER — ADULT MULTIVITAMIN W/MINERALS CH
1.0000 | ORAL_TABLET | Freq: Every day | ORAL | Status: DC
  Administered 2020-05-02 – 2020-05-10 (×9): 1 via ORAL
  Filled 2020-05-02 (×9): qty 1

## 2020-05-02 MED ORDER — BOOST / RESOURCE BREEZE PO LIQD CUSTOM
1.0000 | Freq: Three times a day (TID) | ORAL | Status: DC
  Administered 2020-05-02 – 2020-05-07 (×13): 1 via ORAL

## 2020-05-02 MED ORDER — ZOLPIDEM TARTRATE 5 MG PO TABS
5.0000 mg | ORAL_TABLET | Freq: Every evening | ORAL | Status: AC | PRN
  Administered 2020-05-02 – 2020-05-05 (×3): 5 mg via ORAL
  Filled 2020-05-02 (×3): qty 1

## 2020-05-02 MED ORDER — POTASSIUM CHLORIDE 10 MEQ/100ML IV SOLN
10.0000 meq | INTRAVENOUS | Status: AC
  Administered 2020-05-02 (×6): 10 meq via INTRAVENOUS
  Filled 2020-05-02 (×6): qty 100

## 2020-05-02 NOTE — Progress Notes (Signed)
PROGRESS NOTE    Jelan Batterton  KHT:[id number] DOB: Nov 17, 1955 DOA: 04/26/2020 PCP: Melony Overly, MD    Brief Narrative:  Trammell Bowden is a 64 year old male with past medical history notable for essential hypertension who underwent ERCP on 04/25/2020 with biliary stent exchange for persistent postoperative bile leak after recent cholecystectomy with abscess status post percutaneous drainage who presented to the ED with severe epigastric abdominal pain, nausea, vomiting since ERCP procedure.  He is unable to tolerate any p.o. intake and his urine is dark in color with significant abdominal distention.  In the ED he was noted to be afebrile, tachycardic.  WBC count 41.3, hemoglobin 19.3, bicarb 16, anion gap 17, glucose 191, BUN 36, creatinine 1.4 (baseline 0.7), high sensitive troponin negative x2.  Lactic acid 8.4>4.6, lipase 1379, AST 102, ALT 48, alk phos 69, T bili 3.9.  SARS-CoV-2 2 PCR negative.  CT abdomen/pelvis with findings consistent with acute pancreatitis, no drainable fluid collection or abscess.  Was consulted, feels this is post ERCP pancreatitis and recommends aggressive IV fluid hydration, pain control and empiric antibiotics.  Duration consulted for admission.   Assessment & Plan:   Principal Problem:   Post-ERCP acute pancreatitis Active Problems:   Severe sepsis (HCC)   Polycythemia   AKI (acute kidney injury) ([place])   Metabolic acidosis   Acute respiratory failure with hypoxia (HCC)   Bilateral pleural effusion   Acute pancreatitis post ERCP Patient presenting following ERCP with nausea, vomiting, severe abdominal pain with associated distention.  Elevated lipase of 1300.  CT abdomen/pelvis with findings consistent with acute pancreatitis; no abscess or drainable fluid collection noted. --[facility] folllowing, appreciate assistance --Blood cultures no growth x5 days --Continue Zosyn to complete 7-day course --Further gastroenterology recommendations  Acute  ileus Patient continues with abdominal distention, absent bowel sounds.  KUB 05/01/2020 shows normal gas pattern.  Denies flatus. --Repeat KUB today shows nonobstructive bowel gas pattern --Clear liquid diet --We will encourage increased mobilization  Acute hypoxic respiratory failure secondary to volume overload During hospitalization, patient developed significant hypoxia likely secondary to volume overload from aggressive fluid resuscitation to treat his underlying pancreatitis.  Patient was successfully extubated on 05/01/2020. --Continue supple oxygen, maintain SPO2 greater than 92%, currently on 6 L nasal cannula --BiPAP as needed --Continue diuresis with furosemide 40 mg IV every 12 hours --Incentive spirometry --Strict I's and O's and daily weights --Chest x-ray in a.m.  Acute renal failure: Resolved Creatinine 1.4 on admission with a baseline of 0.7.  Improved with IV fluid hydration and antibiotics.  Creatinine 1.10 today. --Continue monitor renal function closely daily  New onset proximal A. Fib During hospitalization, patient developed new onset atrial fibrillation.  In which she has converted now back to normal sinus rhythm.  CHA2DS2-VASc score = 1.  Was initially started on amiodarone --Continue aspirin 81 mg p.o. daily --Monitor on telemetry  Hypokalemia Potassium 3.1, magnesium 2.1 today.  Will replete potassium. --Monitor electrolytes closely daily  Weakness/debility: PT/OT consult pending   DVT prophylaxis: Lovenox Code Status: Full code Family Communication: No family present at bedside this morning  Disposition Plan:  Status is: Inpatient  Remains inpatient appropriate because:Persistent severe electrolyte disturbances, Ongoing active pain requiring inpatient pain management, Ongoing diagnostic testing needed not appropriate for outpatient work up, Unsafe d/c plan, IV treatments appropriate due to intensity of illness or inability to take PO and Inpatient level  of care appropriate due to severity of illness continues to not tolerate oral intake, persistent back pain  Dispo: The patient is from: Home              Anticipated d/c is to: To be determined              Anticipated d/c date is: > 3 days              Patient currently is not medically stable to d/c.    Consultants:   PCCM  [facility]  Procedures:   Intubation 04/29/2020, extubation 05/01/2020  Antimicrobials:   Zosyn   Subjective: Patient seen and examined bedside, resting comfortably.  Complains of low back pain.  Continues with abdominal discomfort/distention.  Denies flatus.  Continues to require supplemental oxygen, on 4 L nasal cannula.  Overall feels weak and fatigued.  No other complaints or concerns at this time.  Denies headache, no fever/chills/night sweats, no nausea/vomiting/diarrhea, no chest pain, palpitations, no paresthesias.  No acute events overnight per nursing staff.  Objective: Vitals:   05/02/20 0312 05/02/20 0500 05/02/20 0811 05/02/20 1407  BP: (!) 158/88  (!) 183/89 (!) 143/90  Pulse: (!) 101  99 (!) 109  Resp: _0 Temp: 98.1 F (36.7 C)  98.6 F (37 C) 97.6 F (36.4 C)  TempSrc: Oral  Oral Oral  SpO2: 96%  94% 96%  Weight:  86.1 kg    Height:  _1  (1.753 m)      Intake/Output Summary (Last 24 hours) at 05/02/2020 1444 Last data filed at 05/01/2020 2130 Gross per 24 hour  Intake 254.74 ml  Output 3445 ml  Net -3190.26 ml   Filed Weights   04/30/20 0412 05/01/20 0500 05/02/20 0500  Weight: 85.3 kg 86.5 kg 86.1 kg    Examination:  General exam: Appears calm, slightly uncomfortable due to back pain Respiratory system: Clear to auscultation. Respiratory effort normal. Cardiovascular system: S1 & S2 heard, RRR. No JVD, murmurs, rubs, gallops or clicks. No pedal edema. Gastrointestinal system: Abdomen is distended, with mild tenderness epigastrium and right upper quadrant. No organomegaly or masses felt.  Bowel sounds  absent. Central nervous system: Alert and oriented. No focal neurological deficits. Extremities: Symmetric 5 x 5 power. Skin: No rashes, lesions or ulcers Psychiatry: Judgement and insight appear normal. Mood & affect appropriate.     Data Reviewed: I have personally reviewed following labs and imaging studies  CBC: Recent Labs  Lab 04/27/20 0900 04/28/20 0016 04/29/20 0202 04/29/20 0412 04/30/20 0125 04/30/20 0241 04/30/20 0710 04/30/20 0710 04/30/20 1415 04/30/20 2157 05/01/20 0429 05/01/20 1411 05/02/20 0509  WBC 25.4*   < > 16.3*  --  16.8*  --  17.1*  --   --   --  21.7*  --  26.9*  NEUTROABS 23.6*  --  14.4*  --  13.8*  --   --   --   --   --  18.4*  --  23.4*  HGB 16.8   < > 13.0   < > 13.1   < > 12.4*   < > 12.1* 12.3* 12.5* 13.4 12.9*  HCT 51.9   < > 40.0   < > 39.8   < > 38.2*   < > 37.6* 37.6* 38.5* 41.9 40.6  MCV 91.1   < > 90.7  --  88.8  --  89.3  --   --   --  90.6  --  89.4  PLT 161   < > 156  --  165  --  151  --   --   --  169  --  193   < > = values in this interval not displayed.   Basic Metabolic Panel: Recent Labs  Lab 04/29/20 0202 04/29/20 0412 04/29/20 2341 04/30/20 0125 04/30/20 0241 05/01/20 0429 05/02/20 0509  NA 142   < > 142 142 143 145 144  K 2.8*   < > 2.9* 2.7* 2.7* 3.1* 3.1*  CL 108  --  106 104  --  109 105  CO2 18*  --  23 22  --  22 23  GLUCOSE 153*  --  176* 167*  --  130* 165*  BUN 29*  --  26* 27*  --  35* 30*  CREATININE 1.15  --  1.16 1.09  --  1.29* 1.10  CALCIUM 6.2*  --  7.2* 7.1*  --  7.6* 8.2*  MG 2.4  --  2.2 2.2  --  2.0 2.1  PHOS 1.9*  --   --  2.6  --  3.5 3.3   < > = values in this interval not displayed.   GFR: Estimated Creatinine Clearance: 73.8 mL/min (by C-G formula based on SCr of 1.1 mg/dL). Liver Function Tests: Recent Labs  Lab 04/28/20 0016 04/29/20 0202 04/30/20 0125 05/01/20 0429 05/02/20 0509  AST 69* 47* 33 37 44*  ALT 34 _0 ALKPHOS 47 51 59 65 87  BILITOT 3.2* 2.4* 1.5*  1.4* 1.6*  PROT 4.9* 5.4* 5.5* 5.3* 5.6*  ALBUMIN 2.4* 2.6* 2.3* 2.0* 2.1*   Recent Labs  Lab 04/28/20 0016 04/29/20 0202 04/30/20 0125 05/01/20 0429 05/02/20 0509  LIPASE 158* 45 _1 No results for input(s): AMMONIA in the last 168 hours. Coagulation Profile: No results for input(s): INR, PROTIME in the last 168 hours. Cardiac Enzymes: No results for input(s): CKTOTAL, CKMB, CKMBINDEX, TROPONINI in the last 168 hours. BNP (last 3 results) No results for input(s): PROBNP in the last 8760 hours. HbA1C: No results for input(s): HGBA1C in the last 72 hours. CBG: Recent Labs  Lab 05/01/20 1955 05/02/20 0019 05/02/20 0429 05/02/20 0827 05/02/20 1138  GLUCAP 198* 158* 143* 157* 152*   Lipid Profile: Recent Labs    05/02/20 0031  TRIG 210*   Thyroid Function Tests: No results for input(s): TSH, T4TOTAL, FREET4, T3FREE, THYROIDAB in the last 72 hours. Anemia Panel: No results for input(s): VITAMINB12, FOLATE, FERRITIN, TIBC, IRON, RETICCTPCT in the last 72 hours. Sepsis Labs: Recent Labs  Lab 04/26/20 2151 04/27/20 0900 04/28/20 0016 04/28/20 0804 04/29/20 0202 04/30/20 0125  PROCALCITON  --   --   --  4.82 4.16 4.07  LATICACIDVEN 4.6* 3.1* 2.4* 1.5  --   --     Recent Results (from the past 240 hour(s))  Blood culture (routine x 2)     Status: None   Collection Time: 04/26/20  5:16 PM   Specimen: BLOOD  Result Value Ref Range Status   Specimen Description BLOOD LEFT ANTECUBITAL  Final   Special Requests   Final    BOTTLES DRAWN AEROBIC AND ANAEROBIC Blood Culture adequate volume   Culture   Final    NO GROWTH 5 DAYS Performed at [facility], [address] [place],  [address]    Report Status 05/01/2020 FINAL  Final  Blood culture (routine x 2)     Status: None   Collection Time: 04/26/20  5:23 PM   Specimen: BLOOD  Result Value Ref Range Status   Specimen Description BLOOD RIGHT  ANTECUBITAL  Final   Special Requests   Final     BOTTLES DRAWN AEROBIC AND ANAEROBIC Blood Culture results may not be optimal due to an inadequate volume of blood received in culture bottles   Culture   Final    NO GROWTH 5 DAYS Performed at [facility], [place] [address], [place], [place] [address]    Report Status 05/01/2020 FINAL  Final  SARS Coronavirus 2 by RT PCR (hospital order, performed in [facility] hospital lab) Nasopharyngeal Nasopharyngeal Swab     Status: None   Collection Time: 04/26/20  9:50 PM   Specimen: Nasopharyngeal Swab  Result Value Ref Range Status   SARS Coronavirus 2 NEGATIVE NEGATIVE Final    Comment: (NOTE) SARS-CoV-2 target nucleic acids are NOT DETECTED.  The SARS-CoV-2 RNA is generally detectable in upper and lower respiratory specimens during the acute phase of infection. The lowest concentration of SARS-CoV-2 viral copies this assay can detect is 250 copies / mL. A negative result does not preclude SARS-CoV-2 infection and should not be used as the sole basis for treatment or other patient management decisions.  A negative result may occur with improper specimen collection / handling, submission of specimen other than nasopharyngeal swab, presence of viral mutation(s) within the areas targeted by this assay, and inadequate number of viral copies (<250 copies / mL). A negative result must be combined with clinical observations, patient history, and epidemiological information.  Fact Sheet for Patients:   StrictlyIdeas.no  Fact Sheet for Healthcare Providers: BankingDealers.co.za  This test is not yet approved or  cleared by the [place] FDA and has been authorized for detection and/or diagnosis of SARS-CoV-2 by FDA under an Emergency Use Authorization (EUA).  This EUA will remain in effect (meaning this test can be used) for the duration of the COVID-19 declaration under Section 564(b)(1) of the Act, 21 U.S.C. section 360bbb-3(b)(1), unless  the authorization is terminated or revoked sooner.  Performed at [facility], [place] [address] [place], [place] [address]   MRSA PCR Screening     Status: None   Collection Time: 04/30/20 12:10 AM   Specimen: Nasal Mucosa; Nasopharyngeal  Result Value Ref Range Status   MRSA by PCR NEGATIVE NEGATIVE Final    Comment:        The GeneXpert MRSA Assay (FDA approved for NASAL specimens only), is one component of a comprehensive MRSA colonization surveillance program. It is not intended to diagnose MRSA infection nor to guide or monitor treatment for MRSA infections. Performed at [facility], [place] [address] [place], [place] [address]          Radiology Studies: DG Lumbar Spine 2-3 Views  Result Date: 05/02/2020 CLINICAL DATA:  Back pain EXAM: LUMBAR SPINE - 2-3 VIEW COMPARISON:  CT abdomen/pelvis dated 04/28/2020 FINDINGS: Five lumbar-type vertebral bodies. No evidence of fracture or dislocation. Vertebral body heights and intervertebral disc spaces are maintained. Mild degenerative changes at L3-4. Visualized bony pelvis appears intact. IMPRESSION: Negative. Electronically Signed   By: Julian Hy M.D.   On: 05/02/2020 09:48   DG Abd 1 View  Result Date: 05/02/2020 CLINICAL DATA:  Ileus, nausea/vomiting, pancreatitis, recent ERCP EXAM: ABDOMEN - 1 VIEW COMPARISON:  CT abdomen/pelvis dated 04/28/2020 FINDINGS: Nonobstructive bowel gas pattern. Metallic common duct stent.  Cholecystostomy. Visualized osseous structures are within normal limits. IMPRESSION: Nonobstructive bowel gas pattern. Cholecystostomy and common duct stent. Electronically Signed   By: Julian Hy M.D.   On:  05/02/2020 09:47   DG CHEST PORT 1 VIEW  Result Date: 05/01/2020 CLINICAL DATA:  ARDS, intubation EXAM: PORTABLE CHEST 1 VIEW COMPARISON:  Portable exam 0800 hours compared to 04/29/2020 FINDINGS: Tip of endotracheal tube 9 mm above carina recommend withdrawal 1-2 cm. Normal heart  size and mediastinal contours. BILATERAL pulmonary infiltrates slightly improved in perihilar regions since previous exam. No pleural effusion or pneumothorax. IMPRESSION: Recommend withdrawal of endotracheal tube 1-2 cm. Improved pulmonary infiltrates. Findings called to [facility] RN in 40M ICU on 05/01/2020 at 0821 hours. Electronically Signed   By: Lavonia Dana M.D.   On: 05/01/2020 08:22   DG Abd Portable 1V  Result Date: 05/01/2020 CLINICAL DATA:  Ileus. EXAM: PORTABLE ABDOMEN - 1 VIEW COMPARISON:  April 29, 2020. FINDINGS: The bowel gas pattern is normal. Pigtail drainage catheter is noted in right upper quadrant. Biliary stent is noted. IMPRESSION: No evidence of bowel obstruction or ileus. Electronically Signed   By: Marijo Conception M.D.   On: 05/01/2020 12:50        Scheduled Meds: . Chlorhexidine Gluconate Cloth  6 each Topical Daily  . enoxaparin (LOVENOX) injection  40 mg Subcutaneous Q24H  . furosemide  40 mg Intravenous BID  . insulin aspart  0-15 Units Subcutaneous Q4H  . mouth rinse  15 mL Mouth Rinse BID  . pantoprazole  40 mg Intravenous Daily   Continuous Infusions: . sodium chloride 10 mL/hr at 04/30/20 1835  . sodium chloride Stopped (05/01/20 1653)  . methocarbamol (ROBAXIN) IV 500 mg (05/02/20 0033)  . piperacillin-tazobactam (ZOSYN)  IV 3.375 g (05/02/20 1427)     LOS: 5 days    Time spent: 38 minutes spent on chart review, discussion with nursing staff, consultants, updating family and interview/physical exam; more than 50% of that time was spent in counseling and/or coordination of care.    Yamari Ventola J [place] DO [facility] Available via Epic secure chat 7am-7pm After these hours, please refer to coverage provider listed on [email or web address] 05/02/2020, 2:44 PM

## 2020-05-02 NOTE — Progress Notes (Signed)
Nutrition Follow-up  DOCUMENTATION CODES:   Not applicable  INTERVENTION:   -Boost Breeze po TID, each supplement provides 250 kcal and 9 grams of protein -RD will follow for diet advancement and adjust supplement intake as appropriate -If prolonged clear liquid diet is anticipated, consider initiation of enteral nutrition support:  Initiate Vital 1.5 @ 20 ml/hr and increase by 10 ml every 8 hours to goal rate of 60 ml/hr.   45 ml Prosource Plus TID  Tube feeding regimen provides 2280 kcal (98% of needs), 130 grams of protein, and 1100 ml of H2O.   NUTRITION DIAGNOSIS:   Inadequate oral intake related to acute illness as evidenced by NPO status.  Ongoing  GOAL:   Patient will meet greater than or equal to 90% of their needs  Progressing   MONITOR:   PO intake, Supplement acceptance, Diet advancement, Weight trends, Labs, Skin, I & O's  REASON FOR ASSESSMENT:   Ventilator    ASSESSMENT:   64 year old male with PMH of HTN, ERCP on 8/18 s/p biliary stent exchange for persistent postoperative bile leak after recent cholecystectomy with abscess s/- percutaneous drainage. CT abdomen/pelvis showing acute pancreatitis. Pt admitted with severe sepsis secondary to post-ERCP pancreatitis.  8/21 - rapid response due to atrial fibrillation and WOB 8/22 - intubated 8/24- extubated  Reviewed I/O's: -3.5 L x 24 hours and -3.9 L since admission  UOP: 4.2 L x 24 hours  Drain output: 45 ml x 24 hours  Spoke with pt at bedside, who reports not feeling well today. Per pt, he was eating well PTA, however, now feels very poorly. He is apprehensive to try clear liquids, but reports "I'm going to try because the doctor said I should". RD discussed what is on clear liquid diet and potential for diet advancement. Pt was consuming ice chips without difficulty and RD replenished ice chips per his request.   Per GI notes, suspect post ERCP pancreatitis vs ileus. Plan for CT of abdomen and  pelvis with contrast.   Medications reviewed and include lasix.   Labs reviewed: CBGS: 143-157 (inpatient orders for glycemic control are 0-15 units insulin aspart every 4 hours).   NUTRITION - FOCUSED PHYSICAL EXAM:    Most Recent Value  Orbital Region No depletion  Upper Arm Region No depletion  Thoracic and Lumbar Region No depletion  Buccal Region No depletion  Temple Region Mild depletion  Clavicle Bone Region No depletion  Clavicle and Acromion Bone Region No depletion  Scapular Bone Region No depletion  Dorsal Hand No depletion  Patellar Region No depletion  Anterior Thigh Region No depletion  Posterior Calf Region No depletion  Edema (RD Assessment) None  Hair Reviewed  Eyes Reviewed  Mouth Reviewed  Skin Reviewed  Nails Reviewed       Diet Order:   Diet Order            Diet clear liquid Room service appropriate? Yes; Fluid consistency: Thin  Diet effective now                 EDUCATION NEEDS:   Education needs have been addressed  Skin:  Skin Assessment: Skin Integrity Issues: Skin Integrity Issues:: Incisions Incisions: closed abdomen  Last BM:  04/25/20  Height:   Ht Readings from Last 1 Encounters:  05/02/20 5\' 9"  (1.753 m)    Weight:   Wt Readings from Last 1 Encounters:  05/02/20 86.1 kg    Ideal Body Weight:  72.7 kg  BMI:  Body  mass index is 28.03 kg/m.  Estimated Nutritional Needs:   Kcal:  2904-7533  Protein:  130-145 grams  Fluid:  > 2.3 L    Loistine Chance, RD, LDN, [place] Registered Dietitian II Certified Diabetes Care and Education Specialist Please refer to [facility] for RD and/or RD on-call/weekend/after hours pager

## 2020-05-02 NOTE — Progress Notes (Addendum)
Daily Rounding Note  05/02/2020, 9:56 AM  LOS: 5 days   SUBJECTIVE:   Chief complaint:  Pancreatitis, CGE      Denies n/v.  No BM's.  L abd pain persists, moderate intensity.  Feels week.   T max 100.8 on 8/24 at 0400  OBJECTIVE:         Vital signs in last 24 hours:    Temp:  [97.7 F (36.5 C)-98.6 F (37 C)] 98.6 F (37 C) (08/25 0811) Pulse Rate:  [98-193] 99 (08/25 0811) Resp:  [17-36] 17 (08/25 0811) BP: (117-183)/(72-92) 183/89 (08/25 0811) SpO2:  [86 %-96 %] 94 % (08/25 0811) Weight:  [86.1 kg] 86.1 kg (08/25 0500) Last BM Date: 04/25/20 Filed Weights   04/30/20 0412 05/01/20 0500 05/02/20 0500  Weight: 85.3 kg 86.5 kg 86.1 kg   General: looks ill, tired   Heart:  tachy in 1teens Chest: Good air entry bilaterally.  Breathing comfortably on room air..  Some dyspnea w speech Abdomen: soft, ND, moderately tense.  Tender w/o G/R in upper abdomen.  Scant BS  Extremities: slight, non-pitting edema.   Neuro/Psych:  Depressed, laconic, alert, not confused.    Skin: Pale, no jaundice  Intake/Output from previous day: 08/24 0701 - 08/25 0700 In: 725.5 [I.V.:175; IV Piggyback:550.4] Out: 4245 [Urine:4200; Drains:45]  Intake/Output this shift: No intake/output data recorded.  Lab Results: Recent Labs    04/30/20 0710 04/30/20 1415 05/01/20 0429 05/01/20 1411 05/02/20 0509  WBC 17.1*  --  21.7*  --  26.9*  HGB 12.4*   < > 12.5* 13.4 12.9*  HCT 38.2*   < > 38.5* 41.9 40.6  PLT 151  --  169  --  193   < > = values in this interval not displayed.   BMET Recent Labs    04/30/20 0125 04/30/20 0125 04/30/20 0241 05/01/20 0429 05/02/20 0509  NA 142   < > 143 145 144  K 2.7*   < > 2.7* 3.1* 3.1*  CL 104  --   --  109 105  CO2 22  --   --  22 23  GLUCOSE 167*  --   --  130* 165*  BUN 27*  --   --  35* 30*  CREATININE 1.09  --   --  1.29* 1.10  CALCIUM 7.1*  --   --  7.6* 8.2*   < > = values in this  interval not displayed.   LFT Recent Labs    04/30/20 0125 05/01/20 0429 05/02/20 0509  PROT 5.5* 5.3* 5.6*  ALBUMIN 2.3* 2.0* 2.1*  AST 33 37 44*  ALT 24 23 24   ALKPHOS 59 65 87  BILITOT 1.5* 1.4* 1.6*   PT/INR No results for input(s): LABPROT, INR in the last 72 hours. Hepatitis Panel No results for input(s): HEPBSAG, HCVAB, HEPAIGM, HEPBIGM in the last 72 hours.  Studies/Results: DG Lumbar Spine 2-3 Views  Result Date: 05/02/2020 CLINICAL DATA:  Back pain EXAM: LUMBAR SPINE - 2-3 VIEW COMPARISON:  CT abdomen/pelvis dated 04/28/2020 FINDINGS: Five lumbar-type vertebral bodies. No evidence of fracture or dislocation. Vertebral body heights and intervertebral disc spaces are maintained. Mild degenerative changes at L3-4. Visualized bony pelvis appears intact. IMPRESSION: Negative. Electronically Signed   By: Julian Hy M.D.   On: 05/02/2020 09:48   DG Abd 1 View  Result Date: 05/02/2020 CLINICAL DATA:  Ileus, nausea/vomiting, pancreatitis, recent ERCP EXAM: ABDOMEN - 1 VIEW COMPARISON:  CT abdomen/pelvis dated 04/28/2020 FINDINGS: Nonobstructive bowel gas pattern. Metallic common duct stent.  Cholecystostomy. Visualized osseous structures are within normal limits. IMPRESSION: Nonobstructive bowel gas pattern. Cholecystostomy and common duct stent. Electronically Signed   By: Julian Hy M.D.   On: 05/02/2020 09:47   DG CHEST PORT 1 VIEW  Result Date: 05/01/2020 CLINICAL DATA:  ARDS, intubation EXAM: PORTABLE CHEST 1 VIEW COMPARISON:  Portable exam 0800 hours compared to 04/29/2020 FINDINGS: Tip of endotracheal tube 9 mm above carina recommend withdrawal 1-2 cm. Normal heart size and mediastinal contours. BILATERAL pulmonary infiltrates slightly improved in perihilar regions since previous exam. No pleural effusion or pneumothorax. IMPRESSION: Recommend withdrawal of endotracheal tube 1-2 cm. Improved pulmonary infiltrates. Findings called to [facility] RN in [facility]M ICU on 05/01/2020  at 0821 hours. Electronically Signed   By: Lavonia Dana M.D.   On: 05/01/2020 08:22   DG Abd Portable 1V  Result Date: 05/01/2020 CLINICAL DATA:  Ileus. EXAM: PORTABLE ABDOMEN - 1 VIEW COMPARISON:  April 29, 2020. FINDINGS: The bowel gas pattern is normal. Pigtail drainage catheter is noted in right upper quadrant. Biliary stent is noted. IMPRESSION: No evidence of bowel obstruction or ileus. Electronically Signed   By: Marijo Conception M.D.   On: 05/01/2020 12:50   Scheduled Meds: . Chlorhexidine Gluconate Cloth  6 each Topical Daily  . enoxaparin (LOVENOX) injection  40 mg Subcutaneous Q24H  . furosemide  40 mg Intravenous BID  . insulin aspart  0-15 Units Subcutaneous Q4H  . mouth rinse  15 mL Mouth Rinse BID  . pantoprazole  40 mg Intravenous Daily   Continuous Infusions: . sodium chloride 10 mL/hr at 04/30/20 1835  . sodium chloride Stopped (05/01/20 1653)  . methocarbamol (ROBAXIN) IV 500 mg (05/02/20 0033)  . piperacillin-tazobactam (ZOSYN)  IV 3.375 g (05/02/20 0640)  . potassium chloride     PRN Meds:.HYDROmorphone (DILAUDID) injection, lidocaine, methocarbamol (ROBAXIN) IV, metoprolol tartrate, ondansetron, polyvinyl alcohol   ASSESMENT:   *Post ERCP pancreatitis. Post op bile leak, biloma/abscess. 5/15 ERCP/stent on for cholangitis, biliary pancreatitis. Plastic stent placed to ventral duct of pancreatic head.  5/17 ERCP w sphincterotomy, papillary diverticulum, stent had migrated, Pancreatic stent replaced, challenging procedure 5/18 Lap chole.  7/14 ERCP for new bile leak: plastic biliary stent placed.  8/18ERCP wongoing bile leak, new covered metalbiliary stentplaced. WBCs increased  In last 24 hours.   Lipase normalized.  AST  slightly elevated. Blood clx w/o growth.  On zosyn.   *   Ileus, resolved per xray 8/24.    * Hypoxic resp failure. Intubated8/22 - 8/24.  CT w bil pleural effusions, opacities, atx: improved on xray today.   Zosyn day  5.    * CGE. Limited, resolved.    *   AKI.    * Anemia. Mild.  MCV normal.    * Afib/RVR, converted spontaneously to NSR Remains in sinus tach.    * Hypokalemia improved but stillpersists despite runs of K.  6 runs potassium ordered today.  Magnesium and phosphorus level okay.  * Hyperglycemia.   * AKI. Improved.     PLAN   *   ? repeat CT for re-eval of pancreatitis.  Last CTAP w contrast was 8/21.      Azucena Freed  05/02/2020, 9:56 AM Phone [phone]   I have discussed the case with the PA, and that is the plan I formulated. I personally interviewed and examined the patient.  Mr. Needle successfully extubated but  remains quite sick.  He still has diffuse abdominal pain that is primarily upper, intermittent nausea and bilious vomiting including while I was seeing him.  His wife was at the bedside and we discussed everything in detail.  I am sure he still has severe pancreatitis, I am also concerned he may have a persistent ileus.  It sounds like he has not passed much of any gas or stool since being extubated.  ERCP was a week ago, and at this point Mr. Kaigler has had no meaningful nutrition during that time. He is therefore at risk of further decompensation until we can better understand the status of the pancreatitis, ileus and then make a plan for optimal nutrition, nasoduodenal tube versus TPN.  We will order a CT abdomen and pelvis with IV contrast only.    Total time 25 minutes. Nelida Meuse III Office: [phone]

## 2020-05-02 NOTE — Progress Notes (Signed)
Brief GI Progress Note  Had opportunity to visit patient today in ICU. Appreciate great ICU care and my GI team evaluating and taking care of patient. He has had significant Post-ERCP pancreatitis, with things having likely become more of an issue due to his coming into the hospital ED and then leaving and having to come back in over 16 hours later. Suspect this will cause [place] to have a more protracted course. Patient didn't pass SBT but ICU team hopeful for tomorrow. Will give PR Dulcolax to try and stimulate things due to ileus. I had a long 15 minute conversation with patient's wife over the phone this afternoon. Hopefully we see things subside and he only gets interstitial pancreatitis. His output from his drain seems to be decreasing but has not gone away completely. The ICU team is trying to reach out to IR to see how much flushing and how often that should be done. I will follow up with my Inpatient team to see how things are going. I am hopeful that this stent will do its job and cover the area long enough for healing of this chronic biliary leak. Otherwise, interventions at a [facility] care center may need to be considered if there is persistent biliary leak. Potential role of IR drain assessment within the next week if drain output continues may be reasonable. I did not see a Duct of Luschka leak but rather cystic and the stent seems to have covered it completely. Hopefully this will stay in place until need for removal. Otherwise will need to consider plastic stent exchange.  Justice Britain, MD [facility] Office # [phone]

## 2020-05-02 NOTE — Evaluation (Addendum)
I agree with the following treatment note after review of the documentation. This session was performed under the supervision of a licensed clinician.   Lou Miner, DPT  Acute Rehabilitation Services  Pager: [phone]  Physical Therapy Evaluation Patient Details Name: Adam Fowler MRN: [id number] DOB: [birth year] Today's Date: 05/02/2020   History of Present Illness  pt is a 64 yo male who presents to the ED with severe abdominal pain, nausea and vomiting. Found to have post ERCP acute pancreatits. Pt developed Hypoxic resp failure and was Intubated 8/22  and extubated 8/24. Pt also with new onset a fib and acute ileus. PMH includes HTN and ERCP.   Clinical Impression  Pt was evaluated and assessed for the above diagnosis and impairments below. Pt required min guard to min assist with all mobility tasks assessed and utilized RW for transfers. Limited in session due to pt fatigue and notable increase in work of breathing. Oxygen sats noted to be 92-95% on 4L of O2 this session; HR ranged from mid 100s to 135 bpm. Educated about pursed lip breathing to help with slowing respiratory rate. Pt lives with wife at home who can provide full time assistance. Recommend SNF level therapy for this pt given current deficits. Pt would continue to benefit from acute therapy in order to increase his level of independence with functional mobility. Will continue to follow acutely.     Follow Up Recommendations SNF    Equipment Recommendations  Rolling walker with 5" wheels;3in1 (PT)    Recommendations for Other Services       Precautions / Restrictions Precautions Precautions: Fall Restrictions Weight Bearing Restrictions: No      Mobility  Bed Mobility Overal bed mobility: Needs Assistance Bed Mobility: Supine to Sit;Sit to Supine     Supine to sit: Min assist Sit to supine: Min assist   General bed mobility comments: pt required min assist for trunk and LE assist with all bed mobility,  slow with movement  Transfers Overall transfer level: Needs assistance Equipment used: Rolling walker (2 wheeled) Transfers: Stand Pivot Transfers;Sit to/from Stand Sit to Stand: Min assist Stand pivot transfers: Min assist;From elevated surface       General transfer comment: pt required min assist for lift assist and steadying to stand and pivot to/from [facility]. Pt had a very flexed posture throughout transfer as he reports that helped with ease of breathing. Pt with increased work of breathing after transfers and required prolonged rest breaks. Oxygen sats fluctuating between 92-95% on 4L of O2. Required cues for pursed lip breathing to slow rate.   Ambulation/Gait                Stairs            Wheelchair Mobility    Modified Rankin (Stroke Patients Only)       Balance Overall balance assessment: Needs assistance Sitting-balance support: Feet supported;Bilateral upper extremity supported Sitting balance-Leahy Scale: Poor Sitting balance - Comments: pt required Ue support while sitting at EOB   Standing balance support: Bilateral upper extremity supported;During functional activity Standing balance-Leahy Scale: Poor Standing balance comment: pt reliant on RW for support in standing. Pt with very flexed posture to help with breathing                  Pertinent Vitals/Pain Pain Assessment: No/denies pain (patient reports feeling bloated )    Home Living Family/patient expects to be discharged to:: Private residence Living Arrangements: Spouse/significant other Available Help at  Discharge: Family;Available 24 hours/day Type of Home: House Home Access: Level entry     Home Layout: Two level Home Equipment: Grab bars - tub/shower      Prior Function Level of Independence: Independent               Hand Dominance        Extremity/Trunk Assessment   Upper Extremity Assessment Upper Extremity Assessment: Generalized weakness    Lower  Extremity Assessment Lower Extremity Assessment: Generalized weakness    Cervical / Trunk Assessment Cervical / Trunk Assessment: Normal  Communication   Communication: No difficulties  Cognition Arousal/Alertness: Awake/alert Behavior During Therapy: WFL for tasks assessed/performed Overall Cognitive Status: Within Functional Limits for tasks assessed               General Comments General comments (skin integrity, edema, etc.): HR ranged from 106 to 135 bpm. Pt was educated on pursed lip breathing.     Exercises     Assessment/Plan    PT Assessment Patient needs continued PT services  PT Problem List Decreased strength;Decreased activity tolerance;Decreased balance;Decreased mobility;Decreased coordination;Decreased knowledge of use of DME;Cardiopulmonary status limiting activity       PT Treatment Interventions DME instruction;Gait training;Stair training;Functional mobility training;Therapeutic activities;Therapeutic exercise;Balance training    PT Goals (Current goals can be found in the Care Plan section)  Acute Rehab PT Goals Patient Stated Goal: none stated PT Goal Formulation: With patient Time For Goal Achievement: 05/16/20 Potential to Achieve Goals: Good    Frequency Min 2X/week   Barriers to discharge        Co-evaluation               AM-PAC PT "6 Clicks" Mobility  Outcome Measure Help needed turning from your back to your side while in a flat bed without using bedrails?: A Little Help needed moving from lying on your back to sitting on the side of a flat bed without using bedrails?: A Little Help needed moving to and from a bed to a chair (including a wheelchair)?: A Little Help needed standing up from a chair using your arms (e.g., wheelchair or bedside chair)?: A Little Help needed to walk in hospital room?: A Lot Help needed climbing 3-5 steps with a railing? : A Lot 6 Click Score: 16    End of Session Equipment Utilized During  Treatment: Gait belt Activity Tolerance: Patient limited by fatigue (SOB) Patient left: in bed;with call bell/phone within reach;with bed alarm set Nurse Communication: Mobility status (IV beeping) PT Visit Diagnosis: Unsteadiness on feet (R26.81);Muscle weakness (generalized) (M62.81);Difficulty in walking, not elsewhere classified (R26.2)    Time: 1655-1730 PT Time Calculation (min) (ACUTE ONLY): 35 min   Charges:   PT Evaluation $PT Eval Moderate Complexity: 1 Mod PT Treatments $Therapeutic Activity: 8-22 mins       Gloriann Loan, SPT  Acute Rehabilitation Services  Office: [phone]  05/02/2020, 6:39 PM

## 2020-05-02 NOTE — Plan of Care (Signed)
  Problem: Education: Goal: Knowledge of General Education information will improve Description: Including pain rating scale, medication(s)/side effects and non-pharmacologic comfort measures Outcome: Progressing   Problem: Health Behavior/Discharge Planning: Goal: Ability to manage health-related needs will improve Outcome: Progressing   Problem: Clinical Measurements: Goal: Ability to maintain clinical measurements within normal limits will improve Outcome: Progressing Goal: Will remain free from infection Outcome: Progressing Goal: Diagnostic test results will improve Outcome: Progressing Goal: Respiratory complications will improve Outcome: Progressing Goal: Cardiovascular complication will be avoided Outcome: Progressing   Problem: Activity: Goal: Risk for activity intolerance will decrease Outcome: Progressing   Problem: Nutrition: Goal: Adequate nutrition will be maintained Outcome: Progressing   Problem: Coping: Goal: Level of anxiety will decrease Outcome: Progressing   Problem: Elimination: Goal: Will not experience complications related to bowel motility Outcome: Progressing Goal: Will not experience complications related to urinary retention Outcome: Progressing   Problem: Pain Managment: Goal: General experience of comfort will improve Outcome: Progressing   Problem: Safety: Goal: Ability to remain free from injury will improve Outcome: Progressing   Problem: Skin Integrity: Goal: Risk for impaired skin integrity will decrease Outcome: Progressing   Problem: Education: Goal: Knowledge of disease or condition will improve Outcome: Progressing Goal: Understanding of medication regimen will improve Outcome: Progressing Goal: Individualized Educational Video(s) Outcome: Progressing   Problem: Activity: Goal: Ability to tolerate increased activity will improve Outcome: Progressing   Problem: Cardiac: Goal: Ability to achieve and maintain  adequate cardiopulmonary perfusion will improve Outcome: Progressing   Problem: Health Behavior/Discharge Planning: Goal: Ability to safely manage health-related needs after discharge will improve Outcome: Progressing   Problem: Activity: Goal: Ability to tolerate increased activity will improve Outcome: Progressing   Problem: Respiratory: Goal: Ability to maintain a clear airway and adequate ventilation will improve Outcome: Progressing   Problem: Role Relationship: Goal: Method of communication will improve Outcome: Progressing

## 2020-05-03 ENCOUNTER — Inpatient Hospital Stay (HOSPITAL_COMMUNITY): Payer: 59

## 2020-05-03 LAB — COMPREHENSIVE METABOLIC PANEL
ALT: 25 U/L (ref 0–44)
AST: 43 U/L — ABNORMAL HIGH (ref 15–41)
Albumin: 2 g/dL — ABNORMAL LOW (ref 3.5–5.0)
Alkaline Phosphatase: 101 U/L (ref 38–126)
Anion gap: 15 (ref 5–15)
BUN: 24 mg/dL — ABNORMAL HIGH (ref 8–23)
CO2: 22 mmol/L (ref 22–32)
Calcium: 7.8 mg/dL — ABNORMAL LOW (ref 8.9–10.3)
Chloride: 102 mmol/L (ref 98–111)
Creatinine, Ser: 0.86 mg/dL (ref 0.61–1.24)
GFR calc Af Amer: >60 mL/min (ref >60)
GFR calc non Af Amer: >60 mL/min (ref >60)
Glucose, Bld: 164 mg/dL — ABNORMAL HIGH (ref 70–99)
Potassium: 2.3 mmol/L — CL (ref 3.5–5.1)
Sodium: 139 mmol/L (ref 135–145)
Total Bilirubin: 1.3 mg/dL — ABNORMAL HIGH (ref 0.3–1.2)
Total Protein: 5.5 g/dL — ABNORMAL LOW (ref 6.5–8.1)

## 2020-05-03 LAB — MAGNESIUM: Magnesium: 1.8 mg/dL (ref 1.7–2.4)

## 2020-05-03 LAB — CBC WITH DIFFERENTIAL/PLATELET
Abs Immature Granulocytes: 1.38 10*3/uL — ABNORMAL HIGH (ref 0.00–0.07)
Basophils Absolute: 0 10*3/uL (ref 0.0–0.1)
Basophils Relative: 0 %
Eosinophils Absolute: 0.1 10*3/uL (ref 0.0–0.5)
Eosinophils Relative: 0 %
HCT: 40.1 % (ref 39.0–52.0)
Hemoglobin: 13.6 g/dL (ref 13.0–17.0)
Immature Granulocytes: 5 %
Lymphocytes Relative: 4 %
Lymphs Abs: 1.2 10*3/uL (ref 0.7–4.0)
MCH: 29.5 pg (ref 26.0–34.0)
MCHC: 33.9 g/dL (ref 30.0–36.0)
MCV: 87 fL (ref 80.0–100.0)
Monocytes Absolute: 1.2 10*3/uL — ABNORMAL HIGH (ref 0.1–1.0)
Monocytes Relative: 4 %
Neutro Abs: 25.3 10*3/uL — ABNORMAL HIGH (ref 1.7–7.7)
Neutrophils Relative %: 87 %
Platelets: 205 10*3/uL (ref 150–400)
RBC: 4.61 MIL/uL (ref 4.22–5.81)
RDW: 15.8 % — ABNORMAL HIGH (ref 11.5–15.5)
WBC: 29.2 10*3/uL — ABNORMAL HIGH (ref 4.0–10.5)
nRBC: 0 % (ref 0.0–0.2)

## 2020-05-03 LAB — GLUCOSE, CAPILLARY
Glucose-Capillary: 173 mg/dL — ABNORMAL HIGH (ref 70–99)
Glucose-Capillary: 178 mg/dL — ABNORMAL HIGH (ref 70–99)
Glucose-Capillary: 178 mg/dL — ABNORMAL HIGH (ref 70–99)
Glucose-Capillary: 182 mg/dL — ABNORMAL HIGH (ref 70–99)
Glucose-Capillary: 199 mg/dL — ABNORMAL HIGH (ref 70–99)
Glucose-Capillary: 205 mg/dL — ABNORMAL HIGH (ref 70–99)
Glucose-Capillary: 218 mg/dL — ABNORMAL HIGH (ref 70–99)

## 2020-05-03 LAB — LIPASE, BLOOD: Lipase: 20 U/L (ref 11–51)

## 2020-05-03 LAB — PHOSPHORUS
Phosphorus: 2.4 mg/dL — ABNORMAL LOW (ref 2.5–4.6)
Phosphorus: 3.2 mg/dL (ref 2.5–4.6)

## 2020-05-03 MED ORDER — POTASSIUM CHLORIDE 10 MEQ/100ML IV SOLN
10.0000 meq | INTRAVENOUS | Status: AC
  Administered 2020-05-03 (×6): 10 meq via INTRAVENOUS
  Filled 2020-05-03 (×6): qty 100

## 2020-05-03 MED ORDER — MAGNESIUM SULFATE 2 GM/50ML IV SOLN
2.0000 g | Freq: Once | INTRAVENOUS | Status: AC
  Administered 2020-05-03: 2 g via INTRAVENOUS
  Filled 2020-05-03 (×2): qty 50

## 2020-05-03 MED ORDER — IOHEXOL 300 MG/ML  SOLN
100.0000 mL | Freq: Once | INTRAMUSCULAR | Status: AC | PRN
  Administered 2020-05-03: 100 mL via INTRAVENOUS

## 2020-05-03 MED FILL — Sodium Chloride IV Soln 0.9%: INTRAVENOUS | Qty: 250 | Status: AC

## 2020-05-03 MED FILL — Phenylephrine HCl IV Soln 10 MG/ML: INTRAVENOUS | Qty: 1 | Status: AC

## 2020-05-03 NOTE — Progress Notes (Addendum)
Daily Rounding Note  05/03/2020, 9:44 AM  LOS: 6 days   SUBJECTIVE:   Chief complaint: post ERCP pancreatitis.     C/o low back pain.  + fatigue.  No N/V, tolerating clears and want a milkshake from outside hospital.  Also feels like he could eat a sandwich.   Still no BM's and little flatus.   Has not gotten up to bedside chair or walked in many days.   OBJECTIVE:         Vital signs in last 24 hours:    Temp:  [97.6 F (36.4 C)-99.5 F (37.5 C)] 98.6 F (37 C) (08/26 0743) Pulse Rate:  [92-109] 92 (08/26 0743) Resp:  [17] 17 (08/26 0743) BP: (124-143)/(79-90) 142/79 (08/26 0743) SpO2:  [90 %-96 %] 96 % (08/26 0743) Last BM Date: 04/25/20 Filed Weights   04/30/20 0412 05/01/20 0500 05/02/20 0500  Weight: 85.3 kg 86.5 kg 86.1 kg   General: looks tired, weak   Heart: RRR Chest: diminished BS on left base but clear.  No cough.  Slight dyspnea w minor effort Abdomen: soft, ND, minimal tenderness.  Scant BS but no high-pitched or tinkling BS Extremities: no CCE Neuro/Psych:  Alert, oriented x 3.  No tremor.    Intake/Output from previous day: 08/25 0701 - 08/26 0700 In: -  Out: 500 [Urine:450; Drains:50]  Intake/Output this shift: No intake/output data recorded.  Lab Results: Recent Labs    05/01/20 0429 05/01/20 0429 05/01/20 1411 05/02/20 0509 05/03/20 0355  WBC 21.7*  --   --  26.9* 29.2*  HGB 12.5*   < > 13.4 12.9* 13.6  HCT 38.5*   < > 41.9 40.6 40.1  PLT 169  --   --  193 205   < > = values in this interval not displayed.   BMET Recent Labs    05/01/20 0429 05/02/20 0509 05/03/20 0355  NA 145 144 139  K 3.1* 3.1* 2.3*  CL 109 105 102  CO2 22 23 22   GLUCOSE 130* 165* 164*  BUN 35* 30* 24*  CREATININE 1.29* 1.10 0.86  CALCIUM 7.6* 8.2* 7.8*   LFT Recent Labs    05/01/20 0429 05/02/20 0509 05/03/20 0355  PROT 5.3* 5.6* 5.5*  ALBUMIN 2.0* 2.1* 2.0*  AST 37 44* 43*  ALT 23 24 25    ALKPHOS 65 87 101  BILITOT 1.4* 1.6* 1.3*   PT/INR No results for input(s): LABPROT, INR in the last 72 hours. Hepatitis Panel No results for input(s): HEPBSAG, HCVAB, HEPAIGM, HEPBIGM in the last 72 hours.  Studies/Results: DG Lumbar Spine 2-3 Views  Result Date: 05/02/2020 CLINICAL DATA:  Back pain EXAM: LUMBAR SPINE - 2-3 VIEW COMPARISON:  CT abdomen/pelvis dated 04/28/2020 FINDINGS: Five lumbar-type vertebral bodies. No evidence of fracture or dislocation. Vertebral body heights and intervertebral disc spaces are maintained. Mild degenerative changes at L3-4. Visualized bony pelvis appears intact. IMPRESSION: Negative. Electronically Signed   By: Julian Hy M.D.   On: 05/02/2020 09:48   DG Abd 1 View  Result Date: 05/02/2020 CLINICAL DATA:  Ileus, nausea/vomiting, pancreatitis, recent ERCP EXAM: ABDOMEN - 1 VIEW COMPARISON:  CT abdomen/pelvis dated 04/28/2020 FINDINGS: Nonobstructive bowel gas pattern. Metallic common duct stent.  Cholecystostomy. Visualized osseous structures are within normal limits. IMPRESSION: Nonobstructive bowel gas pattern. Cholecystostomy and common duct stent. Electronically Signed   By: Julian Hy M.D.   On: 05/02/2020 09:47   CT ABDOMEN PELVIS W CONTRAST  Result  Date: 05/03/2020 CLINICAL DATA:  Abdominal pain and pancreatitis. Bowel obstruction suspected. EXAM: CT ABDOMEN AND PELVIS WITH CONTRAST TECHNIQUE: Multidetector CT imaging of the abdomen and pelvis was performed using the standard protocol following bolus administration of intravenous contrast. CONTRAST:  193mL OMNIPAQUE IOHEXOL 300 MG/ML  SOLN COMPARISON:  04/28/2020 FINDINGS: Lower chest: Bilateral pleural effusions are identified, left greater than right. Small bilateral pleural effusions with overlying compressive type atelectasis. Bilateral patchy areas of ground-glass attenuation and subsegmental atelectasis noted within the imaged portions of the lungs. Hepatobiliary: No focal liver  abnormality identified. Status post cholecystectomy. Percutaneous pigtail drainage catheter is identified within the gallbladder fossa. The no significant fluid collection remains within the gallbladder fossa. Common bile duct stent is identified. No intrahepatic biliary ductal dilatation. Pancreas: Marked diffuse peripancreatic inflammation with fat stranding and free fluid. Areas of hypoenhancement within the neck and head of pancreas identified consistent with small areas of pancreatic necrosis. The appearance is similar to 04/28/20. No focal fluid collection identified to suggest drainable abscess or pseudocyst. Spleen: Unremarkable. No pancreatic ductal dilatation or surrounding inflammatory changes. Adrenals/Urinary Tract: Normal appearance of the adrenal glands. 2 stones identified within the upper and lower pole of right kidney measuring up to 3 mm. Several small stones are also noted within the left kidney which measure up to 4 mm, image 34/3. No mass or hydronephrosis identified bilaterally. The urinary bladder is unremarkable. Stomach/Bowel: Moderate distension of the stomach. There is no pathologic dilatation of the large or small bowel loops to suggest bowel obstruction. Normal appearance of the appendix. Vascular/Lymphatic: Aortic atherosclerosis. No aneurysm. SMV, portal venous confluence, splenic vein and portal vein remain patent. No abdominopelvic adenopathy. Reproductive: Prostate is unremarkable. Other: Small volume of ascites is noted within the abdomen and pelvis. This is similar to 04/28/2020. No pneumoperitoneum identified. Musculoskeletal: No acute or significant osseous findings. IMPRESSION: 1. Status post cholecystectomy with percutaneous pigtail drainage catheter within the gallbladder fossa. No significant fluid collection remains within the gallbladder fossa. 2. Marked diffuse peripancreatic inflammation with fat stranding and free fluid compatible with acute pancreatitis. Small areas of  hypoenhancement within the neck and head of pancreas consistent with areas of pancreatic necrosis. The appearance is similar to 04/28/2020. No focal fluid collection identified to suggest drainable abscess or pseudocyst. 3. Bilateral pleural effusions, left greater than right. 4. Small volume of ascites. 5. Bilateral nonobstructing renal calculi. 6. Aortic atherosclerosis. Aortic Atherosclerosis (ICD10-I70.0). Electronically Signed   By: Kerby Moors M.D.   On: 05/03/2020 06:51   DG CHEST PORT 1 VIEW  Result Date: 05/03/2020 CLINICAL DATA:  Shortness of breath. EXAM: PORTABLE CHEST 1 VIEW COMPARISON:  Chest x-ray 12/27/2019. FINDINGS: Interim removal of endotracheal tube. Heart size stable. Low lung volumes with bibasilar atelectasis/infiltrates again noted. Similar findings on prior exam. No pleural effusion or pneumothorax. Drainage catheter again noted over the right upper quadrant. No acute bony abnormality identified. IMPRESSION: 1.  Interim removal endotracheal tube. 2. Low lung volumes with bibasilar atelectasis/infiltrates again noted. Similar findings noted on prior exam. Electronically Signed   By: [place]   On: 05/03/2020 06:46   DG Abd Portable 1V  Result Date: 05/01/2020 CLINICAL DATA:  Ileus. EXAM: PORTABLE ABDOMEN - 1 VIEW COMPARISON:  April 29, 2020. FINDINGS: The bowel gas pattern is normal. Pigtail drainage catheter is noted in right upper quadrant. Biliary stent is noted. IMPRESSION: No evidence of bowel obstruction or ileus. Electronically Signed   By: Marijo Conception M.D.   On: 05/01/2020 12:50  Scheduled Meds: . Chlorhexidine Gluconate Cloth  6 each Topical Daily  . enoxaparin (LOVENOX) injection  40 mg Subcutaneous Q24H  . feeding supplement  1 Container Oral TID BM  . furosemide  40 mg Intravenous BID  . insulin aspart  0-15 Units Subcutaneous Q4H  . mouth rinse  15 mL Mouth Rinse BID  . multivitamin with minerals  1 tablet Oral Daily  . pantoprazole  40 mg  Intravenous Daily   Continuous Infusions: . sodium chloride 10 mL/hr at 04/30/20 1835  . sodium chloride Stopped (05/01/20 1653)  . magnesium sulfate bolus IVPB    . methocarbamol (ROBAXIN) IV 500 mg (05/02/20 0033)  . potassium chloride 10 mEq (05/03/20 0852)   PRN Meds:.HYDROmorphone (DILAUDID) injection, lidocaine, methocarbamol (ROBAXIN) IV, metoprolol tartrate, ondansetron, polyvinyl alcohol, zolpidem   ASSESMENT:   *  Bile leak, s/p ERCP/stent replacement 8/18.   Post ERCP pancreatitis. 8/25 CTAP w IV contrast: diffuse pancreatitis, areas necrosis in head/neck but overall appearance similar to 5 d prior CT.  No abscess or pseudocyst.  Small ascites. No ileus per CT and KUB x 2.   D 8 Zosyn.  WBCs rising, no fever.     *   Hypokalemia, normal mag.  Getting runs of K as well as mag today.      *   Low back pain in setting of prolonged bed rest and limited mobility. PT not able to walk pt yesterday due to DOE.     *   Deconditioning.       PLAN   *   Advance to full liquids.  If not tolerated: ? Post pyloric TF vs TNA.  Leave tid Resource supplements in place  *   Work at ambulating and moblizing pt.     Adam Fowler  05/03/2020, 9:44 AM Phone [phone]  I have discussed the case with the PA, and that is the plan I formulated. I personally examined the patient.(he was asleep the entire time in chair with his wife in attendance)  I personally reviewed his CT images.  He reportedly still feels about the same and has tolerated a little full liquids.  Pancreatitis was expectedly severe on CT, I was glad to see it does not look worse.  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  Rising WBC is worrisome, but I think it would be unlikely to have infected necrosis just a week into a course of pancreatitis. No ileus was seen on the CT either. My opinion is that he would be better with a nasoduodenal tube for at least several days because of the severity  of pancreatitis and severe protein calorie malnutrition with risk for worsening of that and current albumin of 2.0.  His wife is not sure he will want to do that, but we will have to talk with him when he is awake and see what he is willing to do.  Total time 35 minutes.  Nelida Meuse III Office: [phone]

## 2020-05-03 NOTE — Progress Notes (Signed)
PROGRESS NOTE    Adam Fowler  PVV:[id number] DOB: [birth year] DOA: 04/26/2020 PCP: Melony Overly, MD    Brief Narrative:  Adam Fowler is a 64 year old male with past medical history notable for essential hypertension who underwent ERCP on 04/25/2020 with biliary stent exchange for persistent postoperative bile leak after recent cholecystectomy with abscess status post percutaneous drainage who presented to the ED with severe epigastric abdominal pain, nausea, vomiting since ERCP procedure.  He is unable to tolerate any p.o. intake and his urine is dark in color with significant abdominal distention.  In the ED he was noted to be afebrile, tachycardic.  WBC count 41.3, hemoglobin 19.3, bicarb 16, anion gap 17, glucose 191, BUN 36, creatinine 1.4 (baseline 0.7), high sensitive troponin negative x2.  Lactic acid 8.4>4.6, lipase 1379, AST 102, ALT 48, alk phos 69, T bili 3.9.  SARS-CoV-2 2 PCR negative.  CT abdomen/pelvis with findings consistent with acute pancreatitis, no drainable fluid collection or abscess.  Was consulted, feels this is post ERCP pancreatitis and recommends aggressive IV fluid hydration, pain control and empiric antibiotics.  Duration consulted for admission.   Assessment & Plan:   Principal Problem:   Post-ERCP acute pancreatitis Active Problems:   Severe sepsis (HCC)   Polycythemia   AKI (acute kidney injury) ([place])   Metabolic acidosis   Acute respiratory failure with hypoxia (HCC)   Bilateral pleural effusion   Acute pancreatitis post ERCP Patient presenting following ERCP with nausea, vomiting, severe abdominal pain with associated distention.  Elevated lipase of 1300.  CT abdomen/pelvis with findings consistent with acute pancreatitis; no abscess or drainable fluid collection noted. --[facility] folllowing, appreciate assistance --Lipase 1379>>>20 --Repeat CT abdomen/pelvis 05/02/2020 with markedly diffuse pancreatic inflammation with fat stranding and free  fluid, no abscess or drainable fluid collection  --Blood cultures no growth x5 days --Continue Zosyn to complete 7-day course --Await further gastroenterology recommendations; given very little oral intake over the past week, only eating ice chips over the past day, have high suspicion that patient will require alternative nutrition means with nasal duodenal feeding tube versus initiation of TPN.  Acute ileus Patient continues with abdominal distention, absent bowel sounds.  KUB 05/01/2020 shows normal gas pattern.  Denies flatus. --Repeat KUB today shows nonobstructive bowel gas pattern --Diet advanced to full liquid per GI today, but patient only ingesting ice chips past 2 days; have little faith that he would tolerate given CT findings above --encourage increased mobilization, PT/OT  Acute hypoxic respiratory failure secondary to volume overload During hospitalization, patient developed significant hypoxia likely secondary to volume overload from aggressive fluid resuscitation to treat his underlying pancreatitis.  Patient was successfully extubated on 05/01/2020. --Continue supple oxygen, maintain SPO2 greater than 92%, currently on room air --BiPAP as needed --Continue diuresis with furosemide 40 mg IV every 12 hours --Incentive spirometry --Strict I's and O's and daily weights --Mobilize with PT/OT  Acute renal failure: Resolved Creatinine 1.4 on admission with a baseline of 0.7.  Improved with IV fluid hydration and antibiotics.  Creatinine 0.86 today. --Continue monitor renal function closely daily  New onset proximal A. Fib During hospitalization, patient developed new onset atrial fibrillation.  In which she has converted now back to normal sinus rhythm.  CHA2DS2-VASc score = 1.  Was initially started on amiodarone --Continue aspirin 81 mg p.o. daily --Monitor on telemetry  Hypokalemia Potassium 3.1, magnesium 2.1 today.  Will replete potassium. --Monitor electrolytes closely  daily  Weakness/debility:  --PT/OT following, currently recommending SNF  DVT prophylaxis: Lovenox Code Status: Full code Family Communication: No family present at bedside this morning  Disposition Plan:  Status is: Inpatient  Remains inpatient appropriate because:Persistent severe electrolyte disturbances, Ongoing active pain requiring inpatient pain management, Ongoing diagnostic testing needed not appropriate for outpatient work up, Unsafe d/c plan, IV treatments appropriate due to intensity of illness or inability to take PO and Inpatient level of care appropriate due to severity of illness continues to not tolerate oral intake, persistent back pain   Dispo: The patient is from: Home              Anticipated d/c is to: SNF              Anticipated d/c date is: > 3 days              Patient currently is not medically stable to d/c.  Continues with significant pancreatitis/inflammation on CT scan, only tolerating ice chips.  Will likely need nasal duodenal feeding tube versus TPN for nutritional needs since whole severe pancreatitis improves and he is able to tolerate oral intake.  Suspect prolonged hospitalization.    Consultants:   PCCM - signed off 05/02/2020  [facility]  Procedures:   Intubation 04/29/2020, extubation 05/01/2020  Antimicrobials:   Zosyn   Subjective: Patient seen and examined bedside, resting comfortably.  Continues with abdominal discomfort, distention, weakness and fatigue.  Also with low back pain, x-rays L-spine unrevealing yesterday.  Continues to only tolerate ice chips, although GI advance diet to full liquid today.  Discussed with patient that needs to increase his oral intake, and if unable may need alternative routes.  No other questions or concerns at this time.  Denies headache, no fever/chills/night sweats, no nausea/vomiting/diarrhea, no chest pain, palpitations, no paresthesias.  No acute events overnight per nursing  staff.  Objective: Vitals:   05/02/20 2018 05/03/20 0348 05/03/20 0743 05/03/20 1326  BP: 129/87 124/80 (!) 142/79 133/78  Pulse: (!) 108 (!) 108 92 75  Resp: 17  17 17   Temp: 99.5 F (37.5 C) 98.4 F (36.9 C) 98.6 F (37 C) 98.4 F (36.9 C)  TempSrc: Oral Oral Oral Oral  SpO2: 95% 90% 96% 97%  Weight:      Height:        Intake/Output Summary (Last 24 hours) at 05/03/2020 1355 Last data filed at 05/03/2020 1009 Gross per 24 hour  Intake 120 ml  Output 1200 ml  Net -1080 ml   Filed Weights   04/30/20 0412 05/01/20 0500 05/02/20 0500  Weight: 85.3 kg 86.5 kg 86.1 kg    Examination:  General exam: Appears calm, slightly uncomfortable due to back pain Respiratory system: Clear to auscultation. Respiratory effort normal. Cardiovascular system: S1 & S2 heard, RRR. No JVD, murmurs, rubs, gallops or clicks. No pedal edema. Gastrointestinal system: Abdomen is distended, with mild tenderness epigastrium and right upper quadrant. No organomegaly or masses felt.  Bowel sounds absent. Central nervous system: Alert and oriented. No focal neurological deficits. Extremities: Symmetric 5 x 5 power. Skin: No rashes, lesions or ulcers Psychiatry: Judgement and insight appear normal. Mood & affect appropriate.     Data Reviewed: I have personally reviewed following labs and imaging studies  CBC: Recent Labs  Lab 04/29/20 0202 04/29/20 0412 04/30/20 0125 04/30/20 0241 04/30/20 0710 04/30/20 1415 04/30/20 2157 05/01/20 0429 05/01/20 1411 05/02/20 0509 05/03/20 0355  WBC 16.3*   < > 16.8*  --  17.1*  --   --  21.7*  --  26.9* 29.2*  NEUTROABS 14.4*  --  13.8*  --   --   --   --  18.4*  --  23.4* 25.3*  HGB 13.0   < > 13.1   < > 12.4*   < > 12.3* 12.5* 13.4 12.9* 13.6  HCT 40.0   < > 39.8   < > 38.2*   < > 37.6* 38.5* 41.9 40.6 40.1  MCV 90.7   < > 88.8  --  89.3  --   --  90.6  --  89.4 87.0  PLT 156   < > 165  --  151  --   --  169  --  193 205   < > = values in this  interval not displayed.   Basic Metabolic Panel: Recent Labs  Lab 04/29/20 0202 04/29/20 0412 04/29/20 2341 04/29/20 2341 04/30/20 0125 04/30/20 0241 05/01/20 0429 05/02/20 0509 05/03/20 0355  NA 142   < > 142   < > 142 143 145 144 139  K 2.8*   < > 2.9*   < > 2.7* 2.7* 3.1* 3.1* 2.3*  CL 108   < > 106  --  104  --  109 105 102  CO2 18*   < > 23  --  22  --  22 23 22   GLUCOSE 153*   < > 176*  --  167*  --  130* 165* 164*  BUN 29*   < > 26*  --  27*  --  35* 30* 24*  CREATININE 1.15   < > 1.16  --  1.09  --  1.29* 1.10 0.86  CALCIUM 6.2*   < > 7.2*  --  7.1*  --  7.6* 8.2* 7.8*  MG 2.4   < > 2.2  --  2.2  --  2.0 2.1 1.8  PHOS 1.9*  --   --   --  2.6  --  3.5 3.3 2.4*   < > = values in this interval not displayed.   GFR: Estimated Creatinine Clearance: 94.4 mL/min (by C-G formula based on SCr of 0.86 mg/dL). Liver Function Tests: Recent Labs  Lab 04/29/20 0202 04/30/20 0125 05/01/20 0429 05/02/20 0509 05/03/20 0355  AST 47* 33 37 44* 43*  ALT 29 24 23 24 25   ALKPHOS 51 59 65 87 101  BILITOT 2.4* 1.5* 1.4* 1.6* 1.3*  PROT 5.4* 5.5* 5.3* 5.6* 5.5*  ALBUMIN 2.6* 2.3* 2.0* 2.1* 2.0*   Recent Labs  Lab 04/29/20 0202 04/30/20 0125 05/01/20 0429 05/02/20 0509 05/03/20 0355  LIPASE 45 23 19 18 20    No results for input(s): AMMONIA in the last 168 hours. Coagulation Profile: No results for input(s): INR, PROTIME in the last 168 hours. Cardiac Enzymes: No results for input(s): CKTOTAL, CKMB, CKMBINDEX, TROPONINI in the last 168 hours. BNP (last 3 results) No results for input(s): PROBNP in the last 8760 hours. HbA1C: No results for input(s): HGBA1C in the last 72 hours. CBG: Recent Labs  Lab 05/02/20 1950 05/03/20 0010 05/03/20 0516 05/03/20 0745 05/03/20 1134  GLUCAP 211* 205* 178* 178* 218*   Lipid Profile: Recent Labs    05/02/20 0031  TRIG 210*   Thyroid Function Tests: No results for input(s): TSH, T4TOTAL, FREET4, T3FREE, THYROIDAB in the last  72 hours. Anemia Panel: No results for input(s): VITAMINB12, FOLATE, FERRITIN, TIBC, IRON, RETICCTPCT in the last 72 hours. Sepsis Labs: Recent Labs  Lab 04/26/20 2151 04/27/20 0900 04/28/20 0016 04/28/20 0804 04/29/20  0202 04/30/20 0125  PROCALCITON  --   --   --  4.82 4.16 4.07  LATICACIDVEN 4.6* 3.1* 2.4* 1.5  --   --     Recent Results (from the past 240 hour(s))  Blood culture (routine x 2)     Status: None   Collection Time: 04/26/20  5:16 PM   Specimen: BLOOD  Result Value Ref Range Status   Specimen Description BLOOD LEFT ANTECUBITAL  Final   Special Requests   Final    BOTTLES DRAWN AEROBIC AND ANAEROBIC Blood Culture adequate volume   Culture   Final    NO GROWTH 5 DAYS Performed at [facility], [place] [address], [place], [place] [address]    Report Status 05/01/2020 FINAL  Final  Blood culture (routine x 2)     Status: None   Collection Time: 04/26/20  5:23 PM   Specimen: BLOOD  Result Value Ref Range Status   Specimen Description BLOOD RIGHT ANTECUBITAL  Final   Special Requests   Final    BOTTLES DRAWN AEROBIC AND ANAEROBIC Blood Culture results may not be optimal due to an inadequate volume of blood received in culture bottles   Culture   Final    NO GROWTH 5 DAYS Performed at [facility], [place] [address], [place], [place] [address]    Report Status 05/01/2020 FINAL  Final  SARS Coronavirus 2 by RT PCR (hospital order, performed in [facility] hospital lab) Nasopharyngeal Nasopharyngeal Swab     Status: None   Collection Time: 04/26/20  9:50 PM   Specimen: Nasopharyngeal Swab  Result Value Ref Range Status   SARS Coronavirus 2 NEGATIVE NEGATIVE Final    Comment: (NOTE) SARS-CoV-2 target nucleic acids are NOT DETECTED.  The SARS-CoV-2 RNA is generally detectable in upper and lower respiratory specimens during the acute phase of infection. The lowest concentration of SARS-CoV-2 viral copies this assay can detect is 250 copies / mL. A negative  result does not preclude SARS-CoV-2 infection and should not be used as the sole basis for treatment or other patient management decisions.  A negative result may occur with improper specimen collection / handling, submission of specimen other than nasopharyngeal swab, presence of viral mutation(s) within the areas targeted by this assay, and inadequate number of viral copies (<250 copies / mL). A negative result must be combined with clinical observations, patient history, and epidemiological information.  Fact Sheet for Patients:   StrictlyIdeas.no  Fact Sheet for Healthcare Providers: BankingDealers.co.za  This test is not yet approved or  cleared by the [place] FDA and has been authorized for detection and/or diagnosis of SARS-CoV-2 by FDA under an Emergency Use Authorization (EUA).  This EUA will remain in effect (meaning this test can be used) for the duration of the COVID-19 declaration under Section 564(b)(1) of the Act, 21 U.S.C. section 360bbb-3(b)(1), unless the authorization is terminated or revoked sooner.  Performed at [facility], [place] [address] [place], [place] [address]   MRSA PCR Screening     Status: None   Collection Time: 04/30/20 12:10 AM   Specimen: Nasal Mucosa; Nasopharyngeal  Result Value Ref Range Status   MRSA by PCR NEGATIVE NEGATIVE Final    Comment:        The GeneXpert MRSA Assay (FDA approved for NASAL specimens only), is one component of a comprehensive MRSA colonization surveillance program. It is not intended to diagnose MRSA infection nor to guide or monitor treatment for  MRSA infections. Performed at [facility], [place] [address] [place], [place] [address]          Radiology Studies: DG Lumbar Spine 2-3 Views  Result Date: 05/02/2020 CLINICAL DATA:  Back pain EXAM: LUMBAR SPINE - 2-3 VIEW COMPARISON:  CT abdomen/pelvis dated 04/28/2020 FINDINGS: Five lumbar-type  vertebral bodies. No evidence of fracture or dislocation. Vertebral body heights and intervertebral disc spaces are maintained. Mild degenerative changes at L3-4. Visualized bony pelvis appears intact. IMPRESSION: Negative. Electronically Signed   By: Julian Hy M.D.   On: 05/02/2020 09:48   DG Abd 1 View  Result Date: 05/02/2020 CLINICAL DATA:  Ileus, nausea/vomiting, pancreatitis, recent ERCP EXAM: ABDOMEN - 1 VIEW COMPARISON:  CT abdomen/pelvis dated 04/28/2020 FINDINGS: Nonobstructive bowel gas pattern. Metallic common duct stent.  Cholecystostomy. Visualized osseous structures are within normal limits. IMPRESSION: Nonobstructive bowel gas pattern. Cholecystostomy and common duct stent. Electronically Signed   By: Julian Hy M.D.   On: 05/02/2020 09:47   CT ABDOMEN PELVIS W CONTRAST  Result Date: 05/03/2020 CLINICAL DATA:  Abdominal pain and pancreatitis. Bowel obstruction suspected. EXAM: CT ABDOMEN AND PELVIS WITH CONTRAST TECHNIQUE: Multidetector CT imaging of the abdomen and pelvis was performed using the standard protocol following bolus administration of intravenous contrast. CONTRAST:  172m OMNIPAQUE IOHEXOL 300 MG/ML  SOLN COMPARISON:  04/28/2020 FINDINGS: Lower chest: Bilateral pleural effusions are identified, left greater than right. Small bilateral pleural effusions with overlying compressive type atelectasis. Bilateral patchy areas of ground-glass attenuation and subsegmental atelectasis noted within the imaged portions of the lungs. Hepatobiliary: No focal liver abnormality identified. Status post cholecystectomy. Percutaneous pigtail drainage catheter is identified within the gallbladder fossa. The no significant fluid collection remains within the gallbladder fossa. Common bile duct stent is identified. No intrahepatic biliary ductal dilatation. Pancreas: Marked diffuse peripancreatic inflammation with fat stranding and free fluid. Areas of hypoenhancement within the neck  and head of pancreas identified consistent with small areas of pancreatic necrosis. The appearance is similar to 04/28/20. No focal fluid collection identified to suggest drainable abscess or pseudocyst. Spleen: Unremarkable. No pancreatic ductal dilatation or surrounding inflammatory changes. Adrenals/Urinary Tract: Normal appearance of the adrenal glands. 2 stones identified within the upper and lower pole of right kidney measuring up to 3 mm. Several small stones are also noted within the left kidney which measure up to 4 mm, image 34/3. No mass or hydronephrosis identified bilaterally. The urinary bladder is unremarkable. Stomach/Bowel: Moderate distension of the stomach. There is no pathologic dilatation of the large or small bowel loops to suggest bowel obstruction. Normal appearance of the appendix. Vascular/Lymphatic: Aortic atherosclerosis. No aneurysm. SMV, portal venous confluence, splenic vein and portal vein remain patent. No abdominopelvic adenopathy. Reproductive: Prostate is unremarkable. Other: Small volume of ascites is noted within the abdomen and pelvis. This is similar to 04/28/2020. No pneumoperitoneum identified. Musculoskeletal: No acute or significant osseous findings. IMPRESSION: 1. Status post cholecystectomy with percutaneous pigtail drainage catheter within the gallbladder fossa. No significant fluid collection remains within the gallbladder fossa. 2. Marked diffuse peripancreatic inflammation with fat stranding and free fluid compatible with acute pancreatitis. Small areas of hypoenhancement within the neck and head of pancreas consistent with areas of pancreatic necrosis. The appearance is similar to 04/28/2020. No focal fluid collection identified to suggest drainable abscess or pseudocyst. 3. Bilateral pleural effusions, left greater than right. 4. Small volume of ascites. 5. Bilateral nonobstructing renal calculi. 6. Aortic atherosclerosis. Aortic Atherosclerosis (ICD10-I70.0).  Electronically Signed   By: TLovena Le  Clovis Riley M.D.   On: 05/03/2020 06:51   DG CHEST PORT 1 VIEW  Result Date: 05/03/2020 CLINICAL DATA:  Shortness of breath. EXAM: PORTABLE CHEST 1 VIEW COMPARISON:  Chest x-ray 12/27/2019. FINDINGS: Interim removal of endotracheal tube. Heart size stable. Low lung volumes with bibasilar atelectasis/infiltrates again noted. Similar findings on prior exam. No pleural effusion or pneumothorax. Drainage catheter again noted over the right upper quadrant. No acute bony abnormality identified. IMPRESSION: 1.  Interim removal endotracheal tube. 2. Low lung volumes with bibasilar atelectasis/infiltrates again noted. Similar findings noted on prior exam. Electronically Signed   By: [place]   On: 05/03/2020 06:46        Scheduled Meds: . Chlorhexidine Gluconate Cloth  6 each Topical Daily  . enoxaparin (LOVENOX) injection  40 mg Subcutaneous Q24H  . feeding supplement  1 Container Oral TID BM  . furosemide  40 mg Intravenous BID  . insulin aspart  0-15 Units Subcutaneous Q4H  . mouth rinse  15 mL Mouth Rinse BID  . multivitamin with minerals  1 tablet Oral Daily  . pantoprazole  40 mg Intravenous Daily   Continuous Infusions: . sodium chloride 10 mL/hr at 04/30/20 1835  . sodium chloride Stopped (05/01/20 1653)  . methocarbamol (ROBAXIN) IV 500 mg (05/02/20 0033)     LOS: 6 days    Time spent: 39 minutes spent on chart review, discussion with nursing staff, consultants, updating family and interview/physical exam; more than 50% of that time was spent in counseling and/or coordination of care.    Costantino Kohlbeck J [place] DO [facility] Available via Epic secure chat 7am-7pm After these hours, please refer to coverage provider listed on [email or web address] 05/03/2020, 1:55 PM

## 2020-05-03 NOTE — Progress Notes (Signed)
CRITICAL VALUE ALERT  Critical Value:  Potassium 2.3  Date & Time Notied: 0530  Provider Notified: [facility]  Orders Received/Actions taken: Potassium x6

## 2020-05-03 NOTE — Evaluation (Signed)
Occupational Therapy Evaluation Patient Details Name: Adam Fowler MRN: [id number] DOB: [birth year] Today's Date: 05/03/2020    History of Present Illness Pt is a 64 yo male who presents to the ED with severe abdominal pain, nausea and vomiting. Found to have post ERCP acute pancreatits. Pt developed Hypoxic resp failure and was Intubated 8/22  and extubated 8/24. Pt also with new onset a fib and acute ileus. PMH includes HTN and ERCP.    Clinical Impression   Pt was independent prior to admission. Presents with generalized weakness, decreased standing balance and impaired cognition. Pt requires up to min assist for ADL and for all mobility. He requires RW for ambulation to bathroom/sink. Pt with HR to 100 and Sp02 down to 88% on RA. Replaced 4L 02 with sats rebounding to 94%. Encouraged use of incentive spirometer. Will follow acutely. Recommending SNF for further rehab prior to returning home.    Follow Up Recommendations  SNF;Supervision/Assistance - 24 hour    Equipment Recommendations  3 in 1 bedside commode (RW)    Recommendations for Other Services       Precautions / Restrictions Precautions Precautions: Fall      Mobility Bed Mobility Overal bed mobility: Needs Assistance Bed Mobility: Supine to Sit;Sit to Supine     Supine to sit: Min assist Sit to supine: Min assist   General bed mobility comments: pt required min assist for trunk and LE assist with all bed mobility, slow with movement  Transfers Overall transfer level: Needs assistance Equipment used: Rolling walker (2 wheeled) Transfers: Stand Pivot Transfers Sit to Stand: Min assist;From elevated surface         General transfer comment: assist to rise and steady, cues for hand placement    Balance Overall balance assessment: Needs assistance   Sitting balance-Leahy Scale: Fair     Standing balance support: Bilateral upper extremity supported;During functional activity Standing balance-Leahy Scale:  Poor Standing balance comment: reliant on RW in standing                           ADL either performed or assessed with clinical judgement   ADL Overall ADL's : Needs assistance/impaired Eating/Feeding: Independent   Grooming: Wash/dry hands;Standing;Min guard   Upper Body Bathing: Minimal assistance;Sitting   Lower Body Bathing: Minimal assistance;Sit to/from stand   Upper Body Dressing : Set up;Sitting   Lower Body Dressing: Minimal assistance;Sit to/from stand   Toilet Transfer: Minimal assistance;Ambulation;RW   Toileting- Clothing Manipulation and Hygiene: Minimal assistance;Sit to/from stand       Functional mobility during ADLs: Minimal assistance;Rolling walker General ADL Comments: Pt easily able to don and doff socks using figure 4 method. Pt educated in use of incentive spirometer.      Vision Baseline Vision/History: Wears glasses Wears Glasses: Reading only Patient Visual Report: No change from baseline       Perception     Praxis      Pertinent Vitals/Pain Pain Assessment: No/denies pain     Hand Dominance Right   Extremity/Trunk Assessment Upper Extremity Assessment Upper Extremity Assessment: Overall WFL for tasks assessed   Lower Extremity Assessment Lower Extremity Assessment: Defer to PT evaluation   Cervical / Trunk Assessment Cervical / Trunk Assessment: Normal   Communication Communication Communication: No difficulties   Cognition Arousal/Alertness: Awake/alert Behavior During Therapy: WFL for tasks assessed/performed Overall Cognitive Status: Impaired/Different from baseline Area of Impairment: Problem solving;Following commands;Safety/judgement  Following Commands: Follows one step commands with increased time Safety/Judgement: Decreased awareness of safety (randomly removes [place])   Problem Solving: Slow processing;Difficulty sequencing;Decreased initiation;Requires verbal cues      General Comments       Exercises     Shoulder Instructions      Home Living Family/patient expects to be discharged to:: Private residence Living Arrangements: Spouse/significant other Available Help at Discharge: Family;Available 24 hours/day Type of Home: House Home Access: Level entry     Home Layout: Two level Alternate Level Stairs-Number of Steps: 15/15 Alternate Level Stairs-Rails: Left Bathroom Shower/Tub: Teacher, early years/pre: Standard     Home Equipment: Grab bars - tub/shower          Prior Functioning/Environment Level of Independence: Independent                 OT Problem List: Decreased strength;Decreased activity tolerance;Impaired balance (sitting and/or standing);Decreased knowledge of use of DME or AE;Decreased cognition;Decreased safety awareness      OT Treatment/Interventions: Self-care/ADL training;DME and/or AE instruction;Therapeutic activities;Patient/family education;Balance training;Cognitive remediation/compensation;Energy conservation    OT Goals(Current goals can be found in the care plan section) Acute Rehab OT Goals Patient Stated Goal: to go back to work OT Goal Formulation: With patient Time For Goal Achievement: 05/17/20 Potential to Achieve Goals: Good ADL Goals Pt Will Perform Grooming: with supervision;standing Pt Will Perform Lower Body Bathing: with supervision;sit to/from stand Pt Will Perform Lower Body Dressing: with supervision;sit to/from stand Pt Will Transfer to Toilet: with supervision;ambulating;bedside commode Pt Will Perform Toileting - Clothing Manipulation and hygiene: with supervision;sit to/from stand Additional ADL Goal #1: Pt will perform bed mobility modified independently.  OT Frequency: Min 2X/week   Barriers to D/C:            Co-evaluation              AM-PAC OT "6 Clicks" Daily Activity     Outcome Measure Help from another person eating meals?: None Help from another  person taking care of personal grooming?: A Little Help from another person toileting, which includes using toliet, bedpan, or urinal?: A Little Help from another person bathing (including washing, rinsing, drying)?: A Little Help from another person to put on and taking off regular upper body clothing?: None Help from another person to put on and taking off regular lower body clothing?: A Little 6 Click Score: 20   End of Session Equipment Utilized During Treatment: Gait belt;Rolling walker  Activity Tolerance: Patient tolerated treatment well Patient left: in bed;with call bell/phone within reach;with bed alarm set  OT Visit Diagnosis: Unsteadiness on feet (R26.81);Other abnormalities of gait and mobility (R26.89);Muscle weakness (generalized) (M62.81);Other symptoms and signs involving cognitive function                Time: 1358-1415 OT Time Calculation (min): 17 min Charges:  OT General Charges $OT Visit: 1 Visit OT Evaluation $OT Eval Moderate Complexity: 1 Mod  Nestor Lewandowsky, OTR/L Acute Rehabilitation Services Pager: [phone] Office: [phone] Malka So 05/03/2020, 3:02 PM

## 2020-05-03 NOTE — Progress Notes (Signed)
Nutrition Follow-up  DOCUMENTATION CODES:   Not applicable  INTERVENTION:   -Continue Boost Breeze po TID, each supplement provides 250 kcal and 9 grams of protein -Continue MVI with minerals daily -Magic cup TID with meals, each supplement provides 290 kcal and 9 grams of protein -RD will follow for diet advancement and adjust supplement regimen as appropriate -If prolonged clear liquid diet/ poor po intake is anticipated, consider initiation of enteral nutrition support:  Initiate Vital 1.5 @ 20 ml/hr and increase by 10 ml every 8 hours to goal rate of 60 ml/hr.   45 ml Prosource Plus TID  Tube feeding regimen provides 2280 kcal (98% of needs), 130 grams of protein, and 1100 ml of H2O.   NUTRITION DIAGNOSIS:   Inadequate oral intake related to acute illness as evidenced by NPO status.  Progressing; advanced to full liquid diet 05/03/20  GOAL:   Patient will meet greater than or equal to 90% of their needs  Progressing   MONITOR:   PO intake, Supplement acceptance, Diet advancement, Weight trends, Labs, Skin, I & O's  REASON FOR ASSESSMENT:   Ventilator    ASSESSMENT:   64 year old male with PMH of HTN, ERCP on 8/18 s/p biliary stent exchange for persistent postoperative bile leak after recent cholecystectomy with abscess s/- percutaneous drainage. CT abdomen/pelvis showing acute pancreatitis. Pt admitted with severe sepsis secondary to post-ERCP pancreatitis.  8/21 - rapid response due to atrial fibrillation and WOB 8/22 - intubated 8/24- extubated 8/25- advanced to clear liquid diet 8/26- advanced to full liquid diet  Reviewed I/O's: -500 ml x 24 hours and -4.4 L since admission  UOP: 450 ml x 24 hours  Drain output: 50 ml x 24 hour  Pt advanced to full liquid diet today. He is accepting Boost Breeze supplements, which GI is asking he continue. Per GI notes, he thinks pt would benefit from a feeding tube, however, his wife is unsure if he would agree to  this; plan to discuss with him later today.   Labs reviewed: CBGS: 178 (inpatient orders for glycemic control are 0-15 units insulin aspart every 4 hours).   Diet Order:   Diet Order            Diet full liquid Room service appropriate? Yes; Fluid consistency: Thin  Diet effective now                 EDUCATION NEEDS:   Education needs have been addressed  Skin:  Skin Assessment: Skin Integrity Issues: Skin Integrity Issues:: Incisions Incisions: closed abdomen  Last BM:  04/25/20  Height:   Ht Readings from Last 1 Encounters:  05/02/20 5\' 9"  (1.753 m)    Weight:   Wt Readings from Last 1 Encounters:  05/02/20 86.1 kg    Ideal Body Weight:  72.7 kg  BMI:  Body mass index is 28.03 kg/m.  Estimated Nutritional Needs:   Kcal:  7494-4967  Protein:  130-145 grams  Fluid:  > 2.3 L    Loistine Chance, RD, LDN, [place] Registered Dietitian II Certified Diabetes Care and Education Specialist Please refer to [facility] for RD and/or RD on-call/weekend/after hours pager

## 2020-05-04 ENCOUNTER — Inpatient Hospital Stay (HOSPITAL_COMMUNITY): Payer: 59

## 2020-05-04 DIAGNOSIS — E43 Unspecified severe protein-calorie malnutrition: Secondary | ICD-10-CM

## 2020-05-04 LAB — BASIC METABOLIC PANEL
Anion gap: 12 (ref 5–15)
BUN: 17 mg/dL (ref 8–23)
CO2: 24 mmol/L (ref 22–32)
Calcium: 7.6 mg/dL — ABNORMAL LOW (ref 8.9–10.3)
Chloride: 100 mmol/L (ref 98–111)
Creatinine, Ser: 0.76 mg/dL (ref 0.61–1.24)
GFR calc Af Amer: >60 mL/min (ref >60)
GFR calc non Af Amer: >60 mL/min (ref >60)
Glucose, Bld: 131 mg/dL — ABNORMAL HIGH (ref 70–99)
Potassium: 2.4 mmol/L — CL (ref 3.5–5.1)
Sodium: 136 mmol/L (ref 135–145)

## 2020-05-04 LAB — CBC WITH DIFFERENTIAL/PLATELET
Abs Immature Granulocytes: 1.23 10*3/uL — ABNORMAL HIGH (ref 0.00–0.07)
Basophils Absolute: 0 10*3/uL (ref 0.0–0.1)
Basophils Relative: 0 %
Eosinophils Absolute: 0.1 10*3/uL (ref 0.0–0.5)
Eosinophils Relative: 0 %
HCT: 39.5 % (ref 39.0–52.0)
Hemoglobin: 13.2 g/dL (ref 13.0–17.0)
Immature Granulocytes: 4 %
Lymphocytes Relative: 4 %
Lymphs Abs: 1.3 10*3/uL (ref 0.7–4.0)
MCH: 29.2 pg (ref 26.0–34.0)
MCHC: 33.4 g/dL (ref 30.0–36.0)
MCV: 87.4 fL (ref 80.0–100.0)
Monocytes Absolute: 1.1 10*3/uL — ABNORMAL HIGH (ref 0.1–1.0)
Monocytes Relative: 3 %
Neutro Abs: 28.6 10*3/uL — ABNORMAL HIGH (ref 1.7–7.7)
Neutrophils Relative %: 89 %
Platelets: 219 10*3/uL (ref 150–400)
RBC: 4.52 MIL/uL (ref 4.22–5.81)
RDW: 15.5 % (ref 11.5–15.5)
WBC: 32.3 10*3/uL — ABNORMAL HIGH (ref 4.0–10.5)
nRBC: 0 % (ref 0.0–0.2)

## 2020-05-04 LAB — MAGNESIUM: Magnesium: 2.1 mg/dL (ref 1.7–2.4)

## 2020-05-04 LAB — GLUCOSE, CAPILLARY
Glucose-Capillary: 122 mg/dL — ABNORMAL HIGH (ref 70–99)
Glucose-Capillary: 137 mg/dL — ABNORMAL HIGH (ref 70–99)
Glucose-Capillary: 158 mg/dL — ABNORMAL HIGH (ref 70–99)
Glucose-Capillary: 182 mg/dL — ABNORMAL HIGH (ref 70–99)
Glucose-Capillary: 183 mg/dL — ABNORMAL HIGH (ref 70–99)

## 2020-05-04 MED ORDER — IOHEXOL 300 MG/ML  SOLN
25.0000 mL | Freq: Once | INTRAMUSCULAR | Status: AC | PRN
  Administered 2020-05-04: 25 mL

## 2020-05-04 MED ORDER — VITAL 1.5 CAL PO LIQD
1000.0000 mL | ORAL | Status: DC
  Administered 2020-05-04 – 2020-05-10 (×8): 1000 mL
  Filled 2020-05-04 (×7): qty 1000

## 2020-05-04 MED ORDER — POTASSIUM CHLORIDE CRYS ER 20 MEQ PO TBCR
40.0000 meq | EXTENDED_RELEASE_TABLET | Freq: Once | ORAL | Status: AC
  Administered 2020-05-04: 40 meq via ORAL
  Filled 2020-05-04: qty 2

## 2020-05-04 MED ORDER — OSMOLITE 1.2 CAL PO LIQD: 1000.0000 mL | ORAL | Status: DC

## 2020-05-04 MED ORDER — PROSOURCE TF PO LIQD
45.0000 mL | Freq: Three times a day (TID) | ORAL | Status: DC
  Administered 2020-05-04 – 2020-05-10 (×17): 45 mL
  Filled 2020-05-04 (×22): qty 45

## 2020-05-04 MED ORDER — LIDOCAINE VISCOUS HCL 2 % MT SOLN
15.0000 mL | Freq: Once | OROMUCOSAL | Status: AC
  Administered 2020-05-04: 6 mL via OROMUCOSAL
  Filled 2020-05-04: qty 15

## 2020-05-04 MED ORDER — POTASSIUM CHLORIDE 10 MEQ/100ML IV SOLN
10.0000 meq | INTRAVENOUS | Status: AC
  Administered 2020-05-04 (×2): 10 meq via INTRAVENOUS
  Filled 2020-05-04 (×2): qty 100

## 2020-05-04 MED ORDER — POTASSIUM CHLORIDE 10 MEQ/100ML IV SOLN
10.0000 meq | INTRAVENOUS | Status: AC
  Administered 2020-05-04 (×2): 10 meq via INTRAVENOUS
  Filled 2020-05-04 (×3): qty 100

## 2020-05-04 MED ORDER — RISAQUAD PO CAPS
2.0000 | ORAL_CAPSULE | Freq: Every day | ORAL | Status: DC
  Administered 2020-05-04 – 2020-06-03 (×29): 2 via ORAL
  Filled 2020-05-04 (×30): qty 2

## 2020-05-04 MED ORDER — POTASSIUM CHLORIDE CRYS ER 20 MEQ PO TBCR
40.0000 meq | EXTENDED_RELEASE_TABLET | Freq: Two times a day (BID) | ORAL | Status: DC
  Administered 2020-05-04 (×2): 40 meq via ORAL
  Filled 2020-05-04 (×2): qty 2

## 2020-05-04 MED ORDER — SODIUM CHLORIDE 0.9 % IV SOLN
1.0000 g | Freq: Three times a day (TID) | INTRAVENOUS | Status: DC
  Administered 2020-05-04 – 2020-05-19 (×44): 1 g via INTRAVENOUS
  Filled 2020-05-04 (×48): qty 1

## 2020-05-04 NOTE — TOC Initial Note (Signed)
Transition of Care [facility]) - Initial/Assessment Note    Patient Details  Name: Adam Fowler MRN: [id number] Date of Birth: 03/08/1956  [facility] CM/SW Contact:    Curlene Labrum, RN Phone Number: 05/04/2020, 3:30 PM  Clinical Narrative:                 Case management went to talk with the patient at the bedside - but he was sleeping after placement of a Cortrak NG tube.  I called and spoke with the patient's wife on the phone regarding choices regarding SNF versus home with home health.  The patient's wife said that she is uncertain if patient will be willing to be placed at a SNF facility but will discuss.  Permission was granted to fax the patient out to facilities to help make informed decision about placement in SNF versus home with home health.  Will work up for SNF placement and leave medicare information regarding SNF facilities in the area.  Expected Discharge Plan: Skilled Nursing Facility Barriers to Discharge: Continued Medical Work up   Patient Goals and CMS Choice Patient states their goals for this hospitalization and ongoing recovery are:: Spoke with patient's wife (former Marine scientist) and she is debating about SNF placement versus Home with [facility] - will discuss with the patient. CMS Medicare.gov Compare Post Acute Care list provided to:: Patient Represenative (must comment) (Patient's wife) Choice offered to / list presented to : Patient  Expected Discharge Plan and Services Expected Discharge Plan: [place]   Discharge Planning Services: CM Consult Post Acute Care Choice: [place] arrangements for the past 2 months: [place]                                      Prior Living Arrangements/Services Living arrangements for the past 2 months: Single Family Home Lives with:: Spouse Patient language and need for interpreter reviewed:: Yes Do you feel safe going back to the place where you  live?: Yes      Need for Family Participation in Patient Care: Yes (Comment)     Criminal Activity/Legal Involvement Pertinent to Current Situation/Hospitalization: No - Comment as needed  Activities of Daily Living      Permission Sought/Granted Permission sought to share information with : Case Manager Permission granted to share information with : Yes, Verbal Permission Granted        Permission granted to share info w Relationship: wife, Adam Fowler - [phone]     Emotional Assessment Appearance:: Appears stated age Attitude/Demeanor/Rapport: Lethargic (Patient sleeping after a procedure) Affect (typically observed): Quiet Orientation: : Oriented to Self, Oriented to Place, Oriented to  Time, Oriented to Situation Alcohol / Substance Use: Not Applicable Psych Involvement: No (comment)  Admission diagnosis:  Acute pancreatitis [K85.90] AKI (acute kidney injury) ([place]) [N17.9] Other acute pancreatitis, unspecified complication status [Y60.63] Patient Active Problem List   Diagnosis Date Noted  . Acute respiratory failure with hypoxia ([place]) 04/28/2020  . Bilateral pleural effusion 04/28/2020  . Post-ERCP acute pancreatitis 04/27/2020  . Severe sepsis ([place]) 04/27/2020  . Polycythemia 04/27/2020  . AKI (acute kidney injury) ([place]) 04/27/2020  . Metabolic acidosis 01/60/1093  . Leukocytosis   . Volume depletion   . Bile leak, postoperative   . Postprocedural intraabdominal abscess 02/13/2020  . Hyperbilirubinemia 02/02/2020  . Pancreatitis 01/21/2020  . Bacteremia   . Elevated LFTs   .  Jaundice   . Special screening for malignant neoplasms, colon 07/09/2011  . Diverticulosis of colon (without mention of hemorrhage) 07/09/2011  . 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

## 2020-05-04 NOTE — Progress Notes (Addendum)
Nutrition Follow-up  DOCUMENTATION CODES:   Not applicable  INTERVENTION:   -Continue Boost Breeze po TID, each supplement provides 250 kcal and 9 grams of protein -Continue MVI with minerals daily -Magic cup TID with meals, each supplement provides 290 kcal and 9 grams of protein -Once cortrak tube tip is verified in post-pyloric position:  InitiateVital 1.5@ 74ml/hr and increase by 10 ml every 8hours to goal rate of 65ml/hr.   34ml Prosource Plus TID  Tube feeding regimen provides2280kcal (98% of needs),130grams of protein, and 1169ml of H2O.   NUTRITION DIAGNOSIS:   Inadequate oral intake related to acute illness as evidenced by NPO status.  Ongoing  GOAL:   Patient will meet greater than or equal to 90% of their needs  Progressing   MONITOR:   PO intake, Supplement acceptance, Labs, Weight trends, TF tolerance, I & O's  REASON FOR ASSESSMENT:   Ventilator    ASSESSMENT:   64 year old male with PMH of HTN, ERCP on 8/18 s/p biliary stent exchange for persistent postoperative bile leak after recent cholecystectomy with abscess s/- percutaneous drainage. CT abdomen/pelvis showing acute pancreatitis. Pt admitted with severe sepsis secondary to post-ERCP pancreatitis.  8/21 - rapid response due to atrial fibrillation and WOB 8/22 - intubated 8/24- extubated 8/25- advanced to clear liquid diet 8/26- advanced to full liquid diet 8/27- advanced to carb modified diet, cortrak tube placed (gastric); plan to advance tube under fluroscopy  Reviewed I/O's: -1.9 L x 24 hours and -6.3 L since admission  UOP: 2.3 L x 24 hours   Drain output: 80 ml x 24 hours  Case discussed with GI PA Theodis Aguas); she is requesting initiation of TF via post-pyloric tube.   Cortrak placement was attempted by team, however, unable to achieve post-pyloric placement. Plan to re-position tube via fluoroscopy- TF to be initiated once tube is in proper position.   Noted pt  has been advanced to a carb modified diet. Meal completion poor; PO 25-50%. Pt is consuming Boost Breeze supplements.   Labs reviewed: K: 2.4 (on PO supplementation), CBGS: 137-173 (inpatient orders for glycemic control are 0-15 units in[place]lin aspart every 4 hours).    Diet Order:   Diet Order            Diet Carb Modified Fluid consistency: Thin; Room service appropriate? Yes  Diet effective now                 EDUCATION NEEDS:   Education needs have been addressed  Skin:  Skin Assessment: Skin Integrity Issues: Skin Integrity Issues:: Incisions Incisions: closed abdomen  Last BM:  04/25/20  Height:   Ht Readings from Last 1 Encounters:  05/02/20 5\' 9"  (1.753 m)    Weight:   Wt Readings from Last 1 Encounters:  05/04/20 82.9 kg    Ideal Body Weight:  72.7 kg  BMI:  Body mass index is 26.99 kg/m.  Estimated Nutritional Needs:   Kcal:  8676-7209  Protein:  130-145 grams  Fluid:  > 2.3 L    Loistine Chance, RD, LDN, [place] Registered Dietitian II Certified Diabetes Care and Education Specialist Please refer to [facility] for RD and/or RD on-call/weekend/after hours pager

## 2020-05-04 NOTE — Progress Notes (Addendum)
Daily Rounding Note  05/04/2020, 9:01 AM  LOS: 7 days   SUBJECTIVE:   Chief complaint:  Post ERCP pancreatitis abd pain a bit better.  Back pain better.  No N/V.  No BM > 1 week per pt recall.  Passing flatus.  Eating 50% of full liquids, wants solid food.  Urine output 2.3 L yesterday.  Pt ok w coretrack FT placement if necessary.    OBJECTIVE:         Vital signs in last 24 hours:    Temp:  [97.4 F (36.3 C)-98.4 F (36.9 C)] 97.7 F (36.5 C) (08/27 0810) Pulse Rate:  [75-100] 94 (08/27 0810) Resp:  [17-18] 17 (08/27 0810) BP: (133-167)/(78-88) 136/79 (08/27 0810) SpO2:  [95 %-97 %] 97 % (08/27 0810) Weight:  [82.9 kg] 82.9 kg (08/27 0500) Last BM Date: 04/25/20 Filed Weights   05/01/20 0500 05/02/20 0500 05/04/20 0500  Weight: 86.5 kg 86.1 kg 82.9 kg   General: looks slightly better, still ill, easily fatigued   Heart: RRR Chest: clear bil.  Some dyspnea w speech and activity Abdomen: softer, active BS, tenderness more focused in LUQ  Extremities: no CCE Neuro/Psych:  Oriented x 3.  laethargic but maintains arousal during conversation.    Intake/Output from previous day: 08/26 0701 - 08/27 0700 In: 480 [P.O.:480] Out: 2380 [Urine:2300; Drains:80]  Intake/Output this shift: No intake/output data recorded.  Lab Results: Recent Labs    05/02/20 0509 05/03/20 0355 05/04/20 0351  WBC 26.9* 29.2* 32.3*  HGB 12.9* 13.6 13.2  HCT 40.6 40.1 39.5  PLT 193 205 219   BMET Recent Labs    05/02/20 0509 05/03/20 0355 05/04/20 0351  NA 144 139 136  K 3.1* 2.3* 2.4*  CL 105 102 100  CO2 23 22 24   GLUCOSE 165* 164* 131*  BUN 30* 24* 17  CREATININE 1.10 0.86 0.76  CALCIUM 8.2* 7.8* 7.6*   LFT Recent Labs    05/02/20 0509 05/03/20 0355  PROT 5.6* 5.5*  ALBUMIN 2.1* 2.0*  AST 44* 43*  ALT 24 25  ALKPHOS 87 101  BILITOT 1.6* 1.3*   PT/INR No results for input(s): LABPROT, INR in the last 72  hours. Hepatitis Panel No results for input(s): HEPBSAG, HCVAB, HEPAIGM, HEPBIGM in the last 72 hours.  Studies/Results: DG Lumbar Spine 2-3 Views  Result Date: 05/02/2020 CLINICAL DATA:  Back pain EXAM: LUMBAR SPINE - 2-3 VIEW COMPARISON:  CT abdomen/pelvis dated 04/28/2020 FINDINGS: Five lumbar-type vertebral bodies. No evidence of fracture or dislocation. Vertebral body heights and intervertebral disc spaces are maintained. Mild degenerative changes at L3-4. Visualized bony pelvis appears intact. IMPRESSION: Negative. Electronically Signed   By: Julian Hy M.D.   On: 05/02/2020 09:48   DG Abd 1 View  Result Date: 05/02/2020 CLINICAL DATA:  Ileus, nausea/vomiting, pancreatitis, recent ERCP EXAM: ABDOMEN - 1 VIEW COMPARISON:  CT abdomen/pelvis dated 04/28/2020 FINDINGS: Nonobstructive bowel gas pattern. Metallic common duct stent.  Cholecystostomy. Visualized osseous structures are within normal limits. IMPRESSION: Nonobstructive bowel gas pattern. Cholecystostomy and common duct stent. Electronically Signed   By: Julian Hy M.D.   On: 05/02/2020 09:47   CT ABDOMEN PELVIS W CONTRAST  Result Date: 05/03/2020 CLINICAL DATA:  Abdominal pain and pancreatitis. Bowel obstruction suspected. EXAM: CT ABDOMEN AND PELVIS WITH CONTRAST TECHNIQUE: Multidetector CT imaging of the abdomen and pelvis was performed using the standard protocol following bolus administration of intravenous contrast. CONTRAST:  145mL OMNIPAQUE IOHEXOL  300 MG/ML  SOLN COMPARISON:  04/28/2020 FINDINGS: Lower chest: Bilateral pleural effusions are identified, left greater than right. Small bilateral pleural effusions with overlying compressive type atelectasis. Bilateral patchy areas of ground-glass attenuation and subsegmental atelectasis noted within the imaged portions of the lungs. Hepatobiliary: No focal liver abnormality identified. Status post cholecystectomy. Percutaneous pigtail drainage catheter is identified  within the gallbladder fossa. The no significant fluid collection remains within the gallbladder fossa. Common bile duct stent is identified. No intrahepatic biliary ductal dilatation. Pancreas: Marked diffuse peripancreatic inflammation with fat stranding and free fluid. Areas of hypoenhancement within the neck and head of pancreas identified consistent with small areas of pancreatic necrosis. The appearance is similar to 04/28/20. No focal fluid collection identified to suggest drainable abscess or pseudocyst. Spleen: Unremarkable. No pancreatic ductal dilatation or surrounding inflammatory changes. Adrenals/Urinary Tract: Normal appearance of the adrenal glands. 2 stones identified within the upper and lower pole of right kidney measuring up to 3 mm. Several small stones are also noted within the left kidney which measure up to 4 mm, image 34/3. No mass or hydronephrosis identified bilaterally. The urinary bladder is unremarkable. Stomach/Bowel: Moderate distension of the stomach. There is no pathologic dilatation of the large or small bowel loops to suggest bowel obstruction. Normal appearance of the appendix. Vascular/Lymphatic: Aortic atherosclerosis. No aneurysm. SMV, portal venous confluence, splenic vein and portal vein remain patent. No abdominopelvic adenopathy. Reproductive: Prostate is unremarkable. Other: Small volume of ascites is noted within the abdomen and pelvis. This is similar to 04/28/2020. No pneumoperitoneum identified. Musculoskeletal: No acute or significant osseous findings. IMPRESSION: 1. Status post cholecystectomy with percutaneous pigtail drainage catheter within the gallbladder fossa. No significant fluid collection remains within the gallbladder fossa. 2. Marked diffuse peripancreatic inflammation with fat stranding and free fluid compatible with acute pancreatitis. Small areas of hypoenhancement within the neck and head of pancreas consistent with areas of pancreatic necrosis. The  appearance is similar to 04/28/2020. No focal fluid collection identified to suggest drainable abscess or pseudocyst. 3. Bilateral pleural effusions, left greater than right. 4. Small volume of ascites. 5. Bilateral nonobstructing renal calculi. 6. Aortic atherosclerosis. Aortic Atherosclerosis (ICD10-I70.0). Electronically Signed   By: Kerby Moors M.D.   On: 05/03/2020 06:51   DG CHEST PORT 1 VIEW  Result Date: 05/03/2020 CLINICAL DATA:  Shortness of breath. EXAM: PORTABLE CHEST 1 VIEW COMPARISON:  Chest x-ray 12/27/2019. FINDINGS: Interim removal of endotracheal tube. Heart size stable. Low lung volumes with bibasilar atelectasis/infiltrates again noted. Similar findings on prior exam. No pleural effusion or pneumothorax. Drainage catheter again noted over the right upper quadrant. No acute bony abnormality identified. IMPRESSION: 1.  Interim removal endotracheal tube. 2. Low lung volumes with bibasilar atelectasis/infiltrates again noted. Similar findings noted on prior exam. Electronically Signed   By: [place]   On: 05/03/2020 06:46    Scheduled Meds:  Chlorhexidine Gluconate Cloth  6 each Topical Daily   enoxaparin (LOVENOX) injection  40 mg Subcutaneous Q24H   feeding supplement  1 Container Oral TID BM   insulin aspart  0-15 Units Subcutaneous Q4H   mouth rinse  15 mL Mouth Rinse BID   multivitamin with minerals  1 tablet Oral Daily   pantoprazole  40 mg Intravenous Daily   potassium chloride  40 mEq Oral BID   Continuous Infusions:  sodium chloride 10 mL/hr at 04/30/20 1835   sodium chloride Stopped (05/01/20 1653)   methocarbamol (ROBAXIN) IV 500 mg (05/02/20 0033)   PRN Meds:.HYDROmorphone (DILAUDID)  injection, lidocaine, methocarbamol (ROBAXIN) IV, metoprolol tartrate, ondansetron, polyvinyl alcohol, zolpidem   ASSESMENT:   *  Bile leak, s/p ERCP/stent replacement 8/18.   Post ERCP pancreatitis. 8/25 CTAP w IV contrast: diffuse pancreatitis, areas  necrosis in head/neck but overall appearance similar to 5 d prior CT.  No abscess or pseudocyst.  Small ascites. No ileus per CT and KUB x 2.   Finished Zosyn 8/26.  WBCs continue to rise 29.2 >> 32.3 in last 24 hours, no fever.     *   Hypokalemia persistent despite runs of potassium, normal mag.      *   Low back pain in setting of prolonged bed rest and limited mobility. PT not able to walk pt yesterday due to DOE.     *   Deconditioning.   -Severe protein calorie malnutrition   PLAN   *   Restart abx?, may benefit from change of abx.    *   Advance to carb mod diet.   *   Further potassium supplements per attending MD.      Azucena Freed  05/04/2020, 9:01 AM Phone [phone]  I have discussed the case with the PA, and that is the plan I formulated. I personally interviewed and examined the patient. His wife was present for my entire visit.  He is nontoxic-appearing but in pain and exhausted. He had just gotten some Dilaudid when I saw him, and I spoke with both him and his wife same time. Mr. Soter already has severe protein calorie malnutrition from the last couple of months of illness, made worse by this admissions critical illness from severe post ERCP pancreatitis with pulmonary edema and respiratory failure.  While he can still take some nutrition by mouth, I do not think he will be able to get sufficient nutrition anytime soon to meet his high caloric needs from the ongoing illness and malnutrition.  He is agreeable to core track tube placement.  I am also increasingly concerned about his rising white count.  If pancreatic necrosis were to become infected, it uncommonly does so this soon into the clinical course.  However, considering that he probably had it for a couple days at home prior to admission and had a biloma requiring a drain which could have been a locus of infection, we have to be concerned about infected necrosis of his pancreatitis.  If that were to be  the case and left untreated, he would be at high risk for clinical deterioration.  We have elected to start meropenem and a probiotic.  We will continue to follow closely.  Total time 35 minutes  Nelida Meuse III Office: [phone]

## 2020-05-04 NOTE — NC FL2 (Signed)
[place] LEVEL OF CARE SCREENING TOOL     IDENTIFICATION  Patient Name: Adam Fowler Birthdate: 02-02-1956 Sex: male Admission Date (Current Location): 04/26/2020  [facility] and [place] Number:  Herbalist and Address:  [facility], [place] [address] [place], [place] [address]      Provider Number: [id number]  Attending Physician Name and Address:  Barb Merino, MD  Relative Name and Phone Number:  Markeese Boyajian - wife - [phone]    Current Level of Care: Hospital Recommended Level of Care: [place] Prior Approval Number:    Date Approved/Denied:   PASRR Number: [id number]  Discharge Plan: SNF    Current Diagnoses: Patient Active Problem List   Diagnosis Date Noted   Acute respiratory failure with hypoxia ([place]) 04/28/2020   Bilateral pleural effusion 04/28/2020   Post-ERCP acute pancreatitis 04/27/2020   Severe sepsis ([place]) 04/27/2020   Polycythemia 04/27/2020   AKI (acute kidney injury) ([place]) 53/66/4403   Metabolic acidosis 47/42/5956   Leukocytosis    Volume depletion    Bile leak, postoperative    Postprocedural intraabdominal abscess 02/13/2020   Hyperbilirubinemia 02/02/2020   Pancreatitis 01/21/2020   Bacteremia    Elevated LFTs    Jaundice    Special screening for malignant neoplasms, colon 07/09/2011   Diverticulosis of colon (without mention of hemorrhage) 07/09/2011   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    Orientation RESPIRATION BLADDER Height & Weight     Self, Time, Situation, Place  O2 Continent Weight: 82.9 kg Height:  5\' 9"  (175.3 cm)  BEHAVIORAL SYMPTOMS/MOOD NEUROLOGICAL BOWEL NUTRITION STATUS      Continent Diet (See Discharge Summary -)  AMBULATORY STATUS COMMUNICATION OF NEEDS Skin   Extensive Assist Verbally Surgical wounds                       Personal Care Assistance Level of Assistance  Bathing, Dressing Bathing Assistance: Limited assistance    Dressing Assistance: Limited assistance     Functional Limitations Info  Sight, Hearing, Speech Sight Info: Impaired Hearing Info: Adequate Speech Info: Adequate    SPECIAL CARE FACTORS FREQUENCY  PT (By licensed PT), OT (By licensed OT)     PT Frequency: 5 x per week OT Frequency: 5 x per week            Contractures Contractures Info: Not present    Additional Factors Info  Code Status, Allergies, Insulin Sliding Scale, Psychotropic Code Status Info: Full code Allergies Info: NKDA Psychotropic Info: Ambien Insulin Sliding Scale Info: See discharge summary       Current Medications (05/04/2020):  This is the current hospital active medication list Current Facility-Administered Medications  Medication Dose Route Frequency Provider Last Rate Last Admin   0.9 %  sodium chloride infusion  250 mL Intravenous Continuous Anders Simmonds, MD 10 mL/hr at 04/30/20 1835 Restarted at 04/30/20 1835   0.9 %  sodium chloride infusion  250 mL Intravenous Continuous Jacky Kindle, MD   Stopped at 05/01/20 1653   acidophilus (RISAQUAD) capsule 2 capsule  2 capsule Oral Daily Vena Rua, PA-C   2 capsule at 05/04/20 1306   Chlorhexidine Gluconate Cloth 2 % PADS 6 each  6 each Topical Daily Allie Bossier, MD   6 each at 05/04/20 1009   enoxaparin (LOVENOX) injection 40 mg  40 mg Subcutaneous Q24H Maudie Mercury, MD   40 mg at 05/04/20 3875   feeding supplement (BOOST /  RESOURCE BREEZE) liquid 1 Container  1 Container Oral TID BM [place] Donnamarie Poag, DO   1 Container at 05/04/20 1306   feeding supplement (PROSource TF) liquid 45 mL  45 mL Per Tube TID Barb Merino, MD       feeding supplement (VITAL 1.5 CAL) liquid 1,000 mL  1,000 mL Per Tube Q24H Barb Merino, MD       HYDROmorphone (DILAUDID) injection 1 mg  1 mg Intravenous Q3H PRN Shela Leff, MD   1 mg at 05/04/20 1008   insulin aspart (novoLOG) injection 0-15 Units  0-15 Units Subcutaneous Q4H Gleason, Otilio Carpen, PA-C   3 Units at 05/04/20 1307   lidocaine (LIDODERM) 5 % 1 patch  1 patch Transdermal Daily PRN Merlene Laughter F, NP   1 patch at 05/04/20 0326   MEDLINE mouth rinse  15 mL Mouth Rinse BID Jacky Kindle, MD   15 mL at 05/04/20 1010   meropenem (MERREM) 1 g in sodium chloride 0.9 % 100 mL IVPB  1 g Intravenous Q8H Skeet Simmer, RPH 200 mL/hr at 05/04/20 1515 1 g at 05/04/20 1515   methocarbamol (ROBAXIN) 500 mg in dextrose 5 % 50 mL IVPB  500 mg Intravenous BID PRN Allie Bossier, MD 100 mL/hr at 05/02/20 0033 500 mg at 05/02/20 0033   metoprolol tartrate (LOPRESSOR) injection 5 mg  5 mg Intravenous Q8H PRN Allie Bossier, MD   5 mg at 04/28/20 1420   multivitamin with minerals tablet 1 tablet  1 tablet Oral Daily [place] Donnamarie Poag, DO   1 tablet at 05/04/20 1008   ondansetron (ZOFRAN) injection 4 mg  4 mg Intravenous Q6H PRN Shela Leff, MD   4 mg at 05/01/20 1937   pantoprazole (PROTONIX) injection 40 mg  40 mg Intravenous Daily Jacky Kindle, MD   40 mg at 05/04/20 1007   polyvinyl alcohol (LIQUIFILM TEARS) 1.4 % ophthalmic solution 1 drop  1 drop Both Eyes PRN Merlene Laughter F, NP       potassium chloride SA (KLOR-CON) CR tablet 40 mEq  40 mEq Oral BID Barb Merino, MD   40 mEq at 05/04/20 1008   zolpidem (AMBIEN) tablet 5 mg  5 mg Oral QHS PRN Lang Snow, FNP   5 mg at 05/03/20 2254     Discharge Medications: Please see discharge summary for a list of discharge medications.  Relevant Imaging Results:  Relevant Lab Results:   Additional Information SS# [id number]  Curlene Labrum, RN

## 2020-05-04 NOTE — Progress Notes (Signed)
Pharmacy Antibiotic Note  Pawan Knechtel is a 64 y.o. male completed 7-day Zosyn course on 8/26 for intra-abdominal coverage/ bile leak/ post-ERCP pancreatitis. Pharmacy has been consulted for Meropenem dosing for rising WBC. Afebrile.  Plan:  Meropenem 1gm IV q8h.  Will follow renal function, clinical status, antibiotic plans.  Height: 5\' 9"  (175.3 cm) Weight: 82.9 kg (182 lb 12.2 oz) IBW/kg (Calculated) : 70.7  Temp (24hrs), Avg:97.9 F (36.6 C), Min:97.4 F (36.3 C), Max:98.4 F (36.9 C)  Recent Labs  Lab 04/28/20 0016 04/28/20 0804 04/29/20 0202 04/30/20 0125 04/30/20 0125 04/30/20 0710 05/01/20 0429 05/02/20 0509 05/03/20 0355 05/04/20 0351  WBC 18.0*  --    < > 16.8*   < > 17.1* 21.7* 26.9* 29.2* 32.3*  CREATININE 1.15  --    < > 1.09  --   --  1.29* 1.10 0.86 0.76  LATICACIDVEN 2.4* 1.5  --   --   --   --   --   --   --   --    < > = values in this interval not displayed.    Estimated Creatinine Clearance: 93.3 mL/min (by C-G formula based on SCr of 0.76 mg/dL).    No Known Allergies  Antimicrobials this admission: Zosyn 8/19>>8/26 Vancomycin 8/22>>8/23 (concern for pneumonia) Meropenem 8/27 >>  Dose adjustments this admission:  n/a  Microbiology results: 8/19 Blood x 2: negative 8/23 MRSA PCR: negative 8/19 COVID: negative  Thank you for allowing pharmacy to be a part of this patient's care.  Arty Baumgartner, [place] Phone: [phone] 05/04/2020 11:57 AM

## 2020-05-04 NOTE — Procedures (Signed)
Cortrak  Person Inserting Tube:  Jacklynn Barnacle E, RD Tube Type:  Cortrak - 43 inches Tube Location:  Left nare Initial Placement:  Stomach Secured by: Bridle Technique Used to Measure Tube Placement:  Documented cm marking at nare/ corner of mouth    Cortrak Tube Team Note:  Consult received to place a Cortrak feeding tube.   Cortrak tube team attempted post-pyloric placement but unable to advance tube post-pyloric. Tube is currently in stomach at 75 cm with bridle in place. Patient is going down to IR for advancement of tube under fluoroscopic guidance.  Tube is not ready to use. Please do not use Cortrak tube until it is advanced post-pyloric and placement is verified under fluoroscopy.  If the tube becomes dislodged please keep the tube and contact the Cortrak team at [email or web address] (password [email or web address]) for replacement.  If after hours and replacement cannot be delayed, place a NG tube and confirm placement with an abdominal x-ray.   Jacklynn Barnacle, MS, RD, LDN Pager number available on Amion

## 2020-05-04 NOTE — Plan of Care (Signed)
  Problem: Safety: Goal: Ability to remain free from injury will improve Outcome: Progressing   

## 2020-05-04 NOTE — Progress Notes (Signed)
PT Cancellation Note  Patient Details Name: Adam Fowler MRN: [id number] DOB: 1955-12-31   Cancelled Treatment:    Reason Eval/Treat Not Completed: Patient at procedure or test/unavailable. Pt on the way to IR for Cortack tube placement. Will follow up later today as time allows vs another date. Acute PT to continue during pt's hospital stay.  Willow Ora, PTA, [place] [phone] 05/04/20, 1:39 PM   Willow Ora 05/04/2020, 1:39 PM

## 2020-05-04 NOTE — Progress Notes (Signed)
PROGRESS NOTE    Adam Fowler  ZOX:[id number] DOB: 1956/08/08 DOA: 04/26/2020 PCP: Melony Overly, MD    Brief Narrative:  Adam Fowler is a 64 year old male with past medical history notable for essential hypertension who underwent ERCP on 04/25/2020 with biliary stent exchange for persistent postoperative bile leak after recent cholecystectomy with abscess status post percutaneous drainage who presented to the ED with severe epigastric abdominal pain, nausea, vomiting since ERCP procedure.  He is unable to tolerate any p.o. intake and his urine is dark in color with significant abdominal distention.  In the ED he was noted to be afebrile, tachycardic.  WBC count 41.3, hemoglobin 19.3, bicarb 16, anion gap 17, glucose 191, BUN 36, creatinine 1.4 (baseline 0.7), high sensitive troponin negative x2.  Lactic acid 8.4>4.6, lipase 1379, AST 102, ALT 48, alk phos 69, T bili 3.9.  SARS-CoV-2 2 PCR negative.  CT abdomen/pelvis with findings consistent with acute pancreatitis, no drainable fluid collection or abscess.     Assessment & Plan:   Principal Problem:   Post-ERCP acute pancreatitis Active Problems:   Severe sepsis (HCC)   Polycythemia   AKI (acute kidney injury) ([place])   Metabolic acidosis   Acute respiratory failure with hypoxia (HCC)   Bilateral pleural effusion   Acute pancreatitis post ERCP Patient presenting following ERCP with nausea, vomiting, severe abdominal pain with associated distention.  Elevated lipase of 1300.  CT abdomen/pelvis with findings consistent with acute pancreatitis; no abscess or drainable fluid collection noted. --[facility] folllowing, appreciate assistance --Lipase 1379>>>20 --Repeat CT abdomen/pelvis 05/02/2020 with markedly diffuse pancreatic inflammation with fat stranding and free fluid, no abscess or drainable fluid collection  --Blood cultures no growth x5 days --Treated with Zosyn.  Finished 7 days of therapy.  Changed to meropenem  today. --Prolonged poor oral intake.  Postpyloric NG tube placement today and tube feeding. --Daily monitoring of electrolytes and renal functions and aggressive replacement.  Acute ileus Patient continues with abdominal distention, absent bowel sounds.  KUB 05/01/2020 shows normal gas pattern.   -- Currently allowed to eat.  Not tolerating much. -- Aggressive potassium and magnesium replacement.  Acute hypoxic respiratory failure secondary to volume overload During hospitalization, patient developed significant hypoxia likely secondary to volume overload from aggressive fluid resuscitation to treat his underlying pancreatitis.  Patient was successfully extubated on 05/01/2020. --Continue supple oxygen, maintain SPO2 greater than 92%, currently on room air --BiPAP as needed --Severe hypokalemia, hold Lasix today.   --Incentive spirometry --Strict I's and O's and daily weights --Mobilize with PT/OT  Acute renal failure: Resolved Creatinine 1.4 on admission with a baseline of 0.7.  Improved with IV fluid hydration and antibiotics.  Creatinine 0.86 today. --Continue monitor renal function closely daily  New onset proximal A. Fib During hospitalization, patient developed new onset atrial fibrillation.  In which she has converted now back to normal sinus rhythm.  CHA2DS2-VASc score = 1.  Was initially started on amiodarone --Continue aspirin 81 mg p.o. daily --Monitor on telemetry  Hypokalemia Potassium 2.4.  Hold Lasix.  Aggressively replace and recheck level tomorrow morning.    Weakness/debility:  --PT/OT following, currently recommending SNF   DVT prophylaxis: Lovenox Code Status: Full code Family Communication: Wife at the bedside.  Disposition Plan:  Status is: Inpatient  Remains inpatient appropriate because:Persistent severe electrolyte disturbances, Ongoing active pain requiring inpatient pain management, Ongoing diagnostic testing needed not appropriate for outpatient work  up, Unsafe d/c plan, IV treatments appropriate due to intensity of illness or inability to  take PO and Inpatient level of care appropriate due to severity of illness continues to not tolerate oral intake, persistent back pain   Dispo: The patient is from: Home              Anticipated d/c is to: SNF              Anticipated d/c date is: > 3 days              Patient currently is not medically stable to d/c.     Consultants:   PCCM - signed off 05/02/2020  [facility]  Procedures:   Intubation 04/29/2020, extubation 05/01/2020  Antimicrobials:   Zosyn 8/19-8/26  Meropenem, 8/27---   Subjective: Patient seen and examined.  Wife at the bedside.  Patient himself was sleepy.  Has been receiving Dilaudid for pain relief.  Has some distended abdomen but denied any nausea vomiting.  No bowel movement.  Not sure about passing gas.  He agreed for NG tube placement.  Objective: Vitals:   05/04/20 0423 05/04/20 0500 05/04/20 0810 05/04/20 1420  BP: (!) 167/88  136/79 135/77  Pulse: 95  94 (!) 101  Resp: 17  17 17   Temp: (!) 97.4 F (36.3 C)  97.7 F (36.5 C) 98.5 F (36.9 C)  TempSrc: Oral  Oral Oral  SpO2: 95%  97% (!) 89%  Weight:  82.9 kg    Height:        Intake/Output Summary (Last 24 hours) at 05/04/2020 1626 Last data filed at 05/04/2020 1551 Gross per 24 hour  Intake 120 ml  Output 1805 ml  Net -1685 ml   Filed Weights   05/01/20 0500 05/02/20 0500 05/04/20 0500  Weight: 86.5 kg 86.1 kg 82.9 kg    Examination:  General exam: Appears calm, sick looking.  On room air. Respiratory system: Clear to auscultation. Respiratory effort normal. Cardiovascular system: S1 & S2 heard, RRR. No JVD, murmurs, rubs, gallops or clicks. No pedal edema. Gastrointestinal system: Abdomen is distended, with mild tenderness epigastrium and right upper quadrant. No organomegaly or masses felt.  Bowel sounds present and sluggish. Central nervous system: Alert and oriented. No focal  neurological deficits. Extremities: Symmetric 5 x 5 power. Skin: No rashes, lesions or ulcers Psychiatry: Anxious with flat affect.    Data Reviewed: I have personally reviewed following labs and imaging studies  CBC: Recent Labs  Lab 04/30/20 0125 04/30/20 0241 04/30/20 0710 04/30/20 1415 05/01/20 0429 05/01/20 1411 05/02/20 0509 05/03/20 0355 05/04/20 0351  WBC 16.8*   < > 17.1*  --  21.7*  --  26.9* 29.2* 32.3*  NEUTROABS 13.8*  --   --   --  18.4*  --  23.4* 25.3* 28.6*  HGB 13.1   < > 12.4*   < > 12.5* 13.4 12.9* 13.6 13.2  HCT 39.8   < > 38.2*   < > 38.5* 41.9 40.6 40.1 39.5  MCV 88.8   < > 89.3  --  90.6  --  89.4 87.0 87.4  PLT 165   < > 151  --  169  --  193 205 219   < > = values in this interval not displayed.   Basic Metabolic Panel: Recent Labs  Lab 04/30/20 0125 04/30/20 0125 04/30/20 0241 05/01/20 0429 05/02/20 0509 05/03/20 0355 05/03/20 1602 05/04/20 0351  NA 142   < > 143 145 144 139  --  136  K 2.7*   < > 2.7* 3.1* 3.1* 2.3*  --  2.4*  CL 104  --   --  109 105 102  --  100  CO2 22  --   --  22 23 22   --  24  GLUCOSE 167*  --   --  130* 165* 164*  --  131*  BUN 27*  --   --  35* 30* 24*  --  17  CREATININE 1.09  --   --  1.29* 1.10 0.86  --  0.76  CALCIUM 7.1*  --   --  7.6* 8.2* 7.8*  --  7.6*  MG 2.2  --   --  2.0 2.1 1.8  --  2.1  PHOS 2.6  --   --  3.5 3.3 2.4* 3.2  --    < > = values in this interval not displayed.   GFR: Estimated Creatinine Clearance: 93.3 mL/min (by C-G formula based on SCr of 0.76 mg/dL). Liver Function Tests: Recent Labs  Lab 04/29/20 0202 04/30/20 0125 05/01/20 0429 05/02/20 0509 05/03/20 0355  AST 47* 33 37 44* 43*  ALT 29 24 23 24 25   ALKPHOS 51 59 65 87 101  BILITOT 2.4* 1.5* 1.4* 1.6* 1.3*  PROT 5.4* 5.5* 5.3* 5.6* 5.5*  ALBUMIN 2.6* 2.3* 2.0* 2.1* 2.0*   Recent Labs  Lab 04/29/20 0202 04/30/20 0125 05/01/20 0429 05/02/20 0509 05/03/20 0355  LIPASE 45 23 19 18 20    No results for input(s):  AMMONIA in the last 168 hours. Coagulation Profile: No results for input(s): INR, PROTIME in the last 168 hours. Cardiac Enzymes: No results for input(s): CKTOTAL, CKMB, CKMBINDEX, TROPONINI in the last 168 hours. BNP (last 3 results) No results for input(s): PROBNP in the last 8760 hours. HbA1C: No results for input(s): HGBA1C in the last 72 hours. CBG: Recent Labs  Lab 05/03/20 2004 05/03/20 2349 05/04/20 0411 05/04/20 1152 05/04/20 1602  GLUCAP 182* 173* 137* 183* 122*   Lipid Profile: Recent Labs    05/02/20 0031  TRIG 210*   Thyroid Function Tests: No results for input(s): TSH, T4TOTAL, FREET4, T3FREE, THYROIDAB in the last 72 hours. Anemia Panel: No results for input(s): VITAMINB12, FOLATE, FERRITIN, TIBC, IRON, RETICCTPCT in the last 72 hours. Sepsis Labs: Recent Labs  Lab 04/28/20 0016 04/28/20 0804 04/29/20 0202 04/30/20 0125  PROCALCITON  --  4.82 4.16 4.07  LATICACIDVEN 2.4* 1.5  --   --     Recent Results (from the past 240 hour(s))  Blood culture (routine x 2)     Status: None   Collection Time: 04/26/20  5:16 PM   Specimen: BLOOD  Result Value Ref Range Status   Specimen Description BLOOD LEFT ANTECUBITAL  Final   Special Requests   Final    BOTTLES DRAWN AEROBIC AND ANAEROBIC Blood Culture adequate volume   Culture   Final    NO GROWTH 5 DAYS Performed at [facility], [address], [place], [place] [address]    Report Status 05/01/2020 FINAL  Final  Blood culture (routine x 2)     Status: None   Collection Time: 04/26/20  5:23 PM   Specimen: BLOOD  Result Value Ref Range Status   Specimen Description BLOOD RIGHT ANTECUBITAL  Final   Special Requests   Final    BOTTLES DRAWN AEROBIC AND ANAEROBIC Blood Culture results may not be optimal due to an inadequate volume of blood received in culture bottles   Culture   Final    NO GROWTH 5 DAYS Performed at [facility], [place] [address], [place], [place] [address]    Report Status  05/01/2020 FINAL  Final  SARS Coronavirus 2 by RT PCR (hospital order, performed in [facility] hospital lab) Nasopharyngeal Nasopharyngeal Swab     Status: None   Collection Time: 04/26/20  9:50 PM   Specimen: Nasopharyngeal Swab  Result Value Ref Range Status   SARS Coronavirus 2 NEGATIVE NEGATIVE Final    Comment: (NOTE) SARS-CoV-2 target nucleic acids are NOT DETECTED.  The SARS-CoV-2 RNA is generally detectable in upper and lower respiratory specimens during the acute phase of infection. The lowest concentration of SARS-CoV-2 viral copies this assay can detect is 250 copies / mL. A negative result does not preclude SARS-CoV-2 infection and should not be used as the sole basis for treatment or other patient management decisions.  A negative result may occur with improper specimen collection / handling, submission of specimen other than nasopharyngeal swab, presence of viral mutation(s) within the areas targeted by this assay, and inadequate number of viral copies (<250 copies / mL). A negative result must be combined with clinical observations, patient history, and epidemiological information.  Fact Sheet for Patients:   StrictlyIdeas.no  Fact Sheet for Healthcare Providers: BankingDealers.co.za  This test is not yet approved or  cleared by the [place] FDA and has been authorized for detection and/or diagnosis of SARS-CoV-2 by FDA under an Emergency Use Authorization (EUA).  This EUA will remain in effect (meaning this test can be used) for the duration of the COVID-19 declaration under Section 564(b)(1) of the Act, 21 U.S.C. section 360bbb-3(b)(1), unless the authorization is terminated or revoked sooner.  Performed at [facility], [place] [address], [place], [place] [address]   MRSA PCR Screening     Status: None   Collection Time: 04/30/20 12:10 AM   Specimen: Nasal Mucosa; Nasopharyngeal  Result Value Ref Range  Status   MRSA by PCR NEGATIVE NEGATIVE Final    Comment:        The GeneXpert MRSA Assay (FDA approved for NASAL specimens only), is one component of a comprehensive MRSA colonization surveillance program. It is not intended to diagnose MRSA infection nor to guide or monitor treatment for MRSA infections. Performed at [facility], [place] [address] [place] [place], [place] [address]          Radiology Studies: DG Abd 1 View  Result Date: 05/04/2020 CLINICAL DATA:  Feeding tube placement by [facility] team EXAM: ABDOMEN - 1 VIEW COMPARISON:  CT 05/03/2020 FINDINGS: Feeding tube tip is post pyloric and likely at the junction of the second and third portions of the duodenum. Contrast is noted within the more distal duodenum. Partially imaged pigtail drainage catheter and common bile duct stent. IMPRESSION: Feeding tube tip is post pyloric and likely at the junction of the second and third portions of the duodenum. Electronically Signed   By: Margaretha Sheffield MD   On: 05/04/2020 15:11   CT ABDOMEN PELVIS W CONTRAST  Result Date: 05/03/2020 CLINICAL DATA:  Abdominal pain and pancreatitis. Bowel obstruction suspected. EXAM: CT ABDOMEN AND PELVIS WITH CONTRAST TECHNIQUE: Multidetector CT imaging of the abdomen and pelvis was performed using the standard protocol following bolus administration of intravenous contrast. CONTRAST:  141m OMNIPAQUE IOHEXOL 300 MG/ML  SOLN COMPARISON:  04/28/2020 FINDINGS: Lower chest: Bilateral pleural effusions are identified, left greater than right. Small bilateral pleural effusions with overlying compressive type atelectasis. Bilateral patchy areas of ground-glass attenuation and subsegmental atelectasis noted within  the imaged portions of the lungs. Hepatobiliary: No focal liver abnormality identified. Status post cholecystectomy. Percutaneous pigtail drainage catheter is identified within the gallbladder fossa. The no significant fluid collection remains within the  gallbladder fossa. Common bile duct stent is identified. No intrahepatic biliary ductal dilatation. Pancreas: Marked diffuse peripancreatic inflammation with fat stranding and free fluid. Areas of hypoenhancement within the neck and head of pancreas identified consistent with small areas of pancreatic necrosis. The appearance is similar to 04/28/20. No focal fluid collection identified to suggest drainable abscess or pseudocyst. Spleen: Unremarkable. No pancreatic ductal dilatation or surrounding inflammatory changes. Adrenals/Urinary Tract: Normal appearance of the adrenal glands. 2 stones identified within the upper and lower pole of right kidney measuring up to 3 mm. Several small stones are also noted within the left kidney which measure up to 4 mm, image 34/3. No mass or hydronephrosis identified bilaterally. The urinary bladder is unremarkable. Stomach/Bowel: Moderate distension of the stomach. There is no pathologic dilatation of the large or small bowel loops to suggest bowel obstruction. Normal appearance of the appendix. Vascular/Lymphatic: Aortic atherosclerosis. No aneurysm. SMV, portal venous confluence, splenic vein and portal vein remain patent. No abdominopelvic adenopathy. Reproductive: Prostate is unremarkable. Other: Small volume of ascites is noted within the abdomen and pelvis. This is similar to 04/28/2020. No pneumoperitoneum identified. Musculoskeletal: No acute or significant osseous findings. IMPRESSION: 1. Status post cholecystectomy with percutaneous pigtail drainage catheter within the gallbladder fossa. No significant fluid collection remains within the gallbladder fossa. 2. Marked diffuse peripancreatic inflammation with fat stranding and free fluid compatible with acute pancreatitis. Small areas of hypoenhancement within the neck and head of pancreas consistent with areas of pancreatic necrosis. The appearance is similar to 04/28/2020. No focal fluid collection identified to suggest  drainable abscess or pseudocyst. 3. Bilateral pleural effusions, left greater than right. 4. Small volume of ascites. 5. Bilateral nonobstructing renal calculi. 6. Aortic atherosclerosis. Aortic Atherosclerosis (ICD10-I70.0). Electronically Signed   By: Kerby Moors M.D.   On: 05/03/2020 06:51   DG CHEST PORT 1 VIEW  Result Date: 05/03/2020 CLINICAL DATA:  Shortness of breath. EXAM: PORTABLE CHEST 1 VIEW COMPARISON:  Chest x-ray 12/27/2019. FINDINGS: Interim removal of endotracheal tube. Heart size stable. Low lung volumes with bibasilar atelectasis/infiltrates again noted. Similar findings on prior exam. No pleural effusion or pneumothorax. Drainage catheter again noted over the right upper quadrant. No acute bony abnormality identified. IMPRESSION: 1.  Interim removal endotracheal tube. 2. Low lung volumes with bibasilar atelectasis/infiltrates again noted. Similar findings noted on prior exam. Electronically Signed   By: [place]as   On: 05/03/2020 06:46        Scheduled Meds: . acidophilus  2 capsule Oral Daily  . Chlorhexidine Gluconate Cloth  6 each Topical Daily  . enoxaparin (LOVENOX) injection  40 mg Subcutaneous Q24H  . feeding supplement  1 Container Oral TID BM  . feeding supplement (PROSource TF)  45 mL Per Tube TID  . feeding supplement (VITAL 1.5 CAL)  1,000 mL Per Tube Q24H  . insulin aspart  0-15 Units Subcutaneous Q4H  . mouth rinse  15 mL Mouth Rinse BID  . multivitamin with minerals  1 tablet Oral Daily  . pantoprazole  40 mg Intravenous Daily  . potassium chloride  40 mEq Oral BID   Continuous Infusions: . sodium chloride 10 mL/hr at 04/30/20 1835  . sodium chloride Stopped (05/01/20 1653)  . meropenem (MERREM) IV 1 g (05/04/20 1515)  . methocarbamol (ROBAXIN) IV 500 mg (  05/02/20 0033)     LOS: 7 days    Time spent: 32 minutes

## 2020-05-05 DIAGNOSIS — R1084 Generalized abdominal pain: Secondary | ICD-10-CM

## 2020-05-05 LAB — COMPREHENSIVE METABOLIC PANEL
ALT: 22 U/L (ref 0–44)
AST: 32 U/L (ref 15–41)
Albumin: 1.7 g/dL — ABNORMAL LOW (ref 3.5–5.0)
Alkaline Phosphatase: 94 U/L (ref 38–126)
Anion gap: 11 (ref 5–15)
BUN: 17 mg/dL (ref 8–23)
CO2: 24 mmol/L (ref 22–32)
Calcium: 7.5 mg/dL — ABNORMAL LOW (ref 8.9–10.3)
Chloride: 100 mmol/L (ref 98–111)
Creatinine, Ser: 0.84 mg/dL (ref 0.61–1.24)
GFR calc Af Amer: >60 mL/min (ref >60)
GFR calc non Af Amer: >60 mL/min (ref >60)
Glucose, Bld: 194 mg/dL — ABNORMAL HIGH (ref 70–99)
Potassium: 3 mmol/L — ABNORMAL LOW (ref 3.5–5.1)
Sodium: 135 mmol/L (ref 135–145)
Total Bilirubin: 0.8 mg/dL (ref 0.3–1.2)
Total Protein: 4.9 g/dL — ABNORMAL LOW (ref 6.5–8.1)

## 2020-05-05 LAB — CBC WITH DIFFERENTIAL/PLATELET
Abs Immature Granulocytes: 1.16 10*3/uL — ABNORMAL HIGH (ref 0.00–0.07)
Basophils Absolute: 0 10*3/uL (ref 0.0–0.1)
Basophils Relative: 0 %
Eosinophils Absolute: 0.1 10*3/uL (ref 0.0–0.5)
Eosinophils Relative: 0 %
HCT: 40.4 % (ref 39.0–52.0)
Hemoglobin: 13.3 g/dL (ref 13.0–17.0)
Immature Granulocytes: 4 %
Lymphocytes Relative: 4 %
Lymphs Abs: 1.1 10*3/uL (ref 0.7–4.0)
MCH: 29.5 pg (ref 26.0–34.0)
MCHC: 32.9 g/dL (ref 30.0–36.0)
MCV: 89.6 fL (ref 80.0–100.0)
Monocytes Absolute: 1.2 10*3/uL — ABNORMAL HIGH (ref 0.1–1.0)
Monocytes Relative: 4 %
Neutro Abs: 27.8 10*3/uL — ABNORMAL HIGH (ref 1.7–7.7)
Neutrophils Relative %: 88 %
Platelets: 237 10*3/uL (ref 150–400)
RBC: 4.51 MIL/uL (ref 4.22–5.81)
RDW: 15.3 % (ref 11.5–15.5)
WBC: 31.4 10*3/uL — ABNORMAL HIGH (ref 4.0–10.5)
nRBC: 0 % (ref 0.0–0.2)

## 2020-05-05 LAB — GLUCOSE, CAPILLARY
Glucose-Capillary: 183 mg/dL — ABNORMAL HIGH (ref 70–99)
Glucose-Capillary: 237 mg/dL — ABNORMAL HIGH (ref 70–99)
Glucose-Capillary: 255 mg/dL — ABNORMAL HIGH (ref 70–99)
Glucose-Capillary: 182 mg/dL — ABNORMAL HIGH (ref 70–99)
Glucose-Capillary: 161 mg/dL — ABNORMAL HIGH (ref 70–99)

## 2020-05-05 LAB — MAGNESIUM: Magnesium: 2.1 mg/dL (ref 1.7–2.4)

## 2020-05-05 LAB — PHOSPHORUS: Phosphorus: 3.4 mg/dL (ref 2.5–4.6)

## 2020-05-05 MED ORDER — POTASSIUM CHLORIDE 20 MEQ/15ML (10%) PO SOLN
40.0000 meq | Freq: Two times a day (BID) | ORAL | Status: DC
  Administered 2020-05-05 – 2020-05-09 (×9): 40 meq via ORAL
  Filled 2020-05-05 (×9): qty 30

## 2020-05-05 MED ORDER — KETOROLAC TROMETHAMINE 15 MG/ML IJ SOLN
15.0000 mg | Freq: Four times a day (QID) | INTRAMUSCULAR | Status: DC | PRN
  Administered 2020-05-05 – 2020-05-08 (×7): 15 mg via INTRAVENOUS
  Filled 2020-05-05 (×7): qty 1

## 2020-05-05 MED ORDER — PANTOPRAZOLE SODIUM 40 MG PO TBEC
40.0000 mg | DELAYED_RELEASE_TABLET | Freq: Every day | ORAL | Status: DC
  Administered 2020-05-06 – 2020-05-16 (×11): 40 mg via ORAL
  Filled 2020-05-05 (×11): qty 1

## 2020-05-05 NOTE — Plan of Care (Signed)

## 2020-05-05 NOTE — Progress Notes (Addendum)
Daily Rounding Note  05/05/2020, 12:44 PM  LOS: 8 days   SUBJECTIVE:   Chief complaint:  Post ERCP pancreatitis   Feels a lot better.  Attributes much of this to the fact that he has had 3 large, loose, urgent/incontinent stools so far today.  These are the first bowel movements he has had in many days.  No nausea.  The abdominal pain has diminished along with a sense of bloating.  Now he has left sided abdominal pain predominantly when he is twisting/turning. Still feels significantly weak and tires out easily.  Does not feel short of breath at rest.  OBJECTIVE:         Vital signs in last 24 hours:    Temp:  [97.5 F (36.4 C)-98.5 F (36.9 C)] 98.2 F (36.8 C) (08/28 0730) Pulse Rate:  [85-101] 85 (08/28 0730) Resp:  [17-18] 17 (08/28 0730) BP: (118-139)/(72-78) 119/74 (08/28 0730) SpO2:  [89 %-97 %] 96 % (08/28 0730) Last BM Date: 05/03/20 Filed Weights   05/01/20 0500 05/02/20 0500 05/04/20 0500  Weight: 86.5 kg 86.1 kg 82.9 kg   General: Looks better but still ill and weak. Heart: RRR.  No MRG.  S1, S2 present Chest: Short of breath with effort and with longer speaking.  No cough.  Diminished breath sounds at the bases otherwise clear. Abdomen: Still tight.  Tenderness is localized into the left upper and mid quadrant without guarding or rebound.  Bowel sounds present Extremities: No CCE. Neuro/Psych: Alert.  Oriented x3.  Mood better/brighter affect.  Intake/Output from previous day: 08/27 0701 - 08/28 0700 In: 683 [P.O.:285; I.V.:146.2; NG/GT:96; IV Piggyback:155.8] Out: 870 [Urine:825; Drains:45]  Intake/Output this shift: Total I/O In: 240 [P.O.:240] Out: 40 [Drains:40]  Lab Results: Recent Labs    05/03/20 0355 05/04/20 0351 05/05/20 0324  WBC 29.2* 32.3* 31.4*  HGB 13.6 13.2 13.3  HCT 40.1 39.5 40.4  PLT 205 219 237   BMET Recent Labs    05/03/20 0355 05/04/20 0351 05/05/20 0324  NA 139  136 135  K 2.3* 2.4* 3.0*  CL 102 100 100  CO2 22 24 24   GLUCOSE 164* 131* 194*  BUN 24* 17 17  CREATININE 0.86 0.76 0.84  CALCIUM 7.8* 7.6* 7.5*   LFT Recent Labs    05/03/20 0355 05/05/20 0324  PROT 5.5* 4.9*  ALBUMIN 2.0* 1.7*  AST 43* 32  ALT 25 22  ALKPHOS 101 94  BILITOT 1.3* 0.8   PT/INR No results for input(s): LABPROT, INR in the last 72 hours. Hepatitis Panel No results for input(s): HEPBSAG, HCVAB, HEPAIGM, HEPBIGM in the last 72 hours.  Studies/Results: DG Abd 1 View  Result Date: 05/04/2020 CLINICAL DATA:  Feeding tube placement by [facility] team EXAM: ABDOMEN - 1 VIEW COMPARISON:  CT 05/03/2020 FINDINGS: Feeding tube tip is post pyloric and likely at the junction of the second and third portions of the duodenum. Contrast is noted within the more distal duodenum. Partially imaged pigtail drainage catheter and common bile duct stent. IMPRESSION: Feeding tube tip is post pyloric and likely at the junction of the second and third portions of the duodenum. Electronically Signed   By: Margaretha Sheffield MD   On: 05/04/2020 15:11    Scheduled Meds: . acidophilus  2 capsule Oral Daily  . Chlorhexidine Gluconate Cloth  6 each Topical Daily  . enoxaparin (LOVENOX) injection  40 mg Subcutaneous Q24H  . feeding supplement  1 Container  Oral TID BM  . feeding supplement (PROSource TF)  45 mL Per Tube TID  . feeding supplement (VITAL 1.5 CAL)  1,000 mL Per Tube Q24H  . insulin aspart  0-15 Units Subcutaneous Q4H  . mouth rinse  15 mL Mouth Rinse BID  . multivitamin with minerals  1 tablet Oral Daily  . [START ON 05/06/2020] pantoprazole  40 mg Oral Daily  . potassium chloride  40 mEq Oral BID   Continuous Infusions: . sodium chloride 10 mL/hr at 04/30/20 1835  . sodium chloride 250 mL (05/04/20 2216)  . meropenem (MERREM) IV 1 g (05/05/20 0516)  . methocarbamol (ROBAXIN) IV 500 mg (05/05/20 0122)   PRN Meds:.HYDROmorphone (DILAUDID) injection, ketorolac, lidocaine,  methocarbamol (ROBAXIN) IV, metoprolol tartrate, ondansetron, polyvinyl alcohol, zolpidem   ASSESMENT:   *Bile leak, s/p ERCP/stent replacement 8/18.  Post ERCP pancreatitis. 8/25 CTAP w IV contrast: diffuse pancreatitis, areas necrosis in head/neck but overall appearance similar to 5 d prior CT. No abscess or pseudocyst. Small ascites. No ileus per CT and KUB x 2.  Finished Zosyn 8/26. Meropenem started 8/27. WBCs 29.2 >> 32.3 >> 31.4 in last 24 hours, no fevers.   * Hypokalemia persistent despite runs of potassium, normal mag.   * Deconditioning.     *   Severe protein calorie malnutrition.  IR placed naso duodenal tube and Vital 1.5, prostat started 8/27.  Thus far is tolerating tube feeds well.  Daily multivitamin.       PLAN   *   Continue tube feeds which currently are at 50 mL/hour, should increase to goal rate of 60 mL/hour by the end of today.  *    Defer management of hypokalemia to Dr. Sloan Leiter.      Azucena Freed  05/05/2020, 12:44 PM Phone [phone]  I have discussed the case with the PA, and that is the plan I formulated. I personally interviewed and examined the patient.  Mr. Werts was reportedly doing well earlier today, but shortly before I visited him, he began having spasms of generalized abdominal pain with bloating and his nurse was about to get him some Dilaudid. Tube feeding was started at 30 cc an hour overnight and then increase by 10 cc every 4 hours to 60 cc/h.  He apparently had several large bowel movements earlier today, which led to improvement in his abdominal pain and bloating.  I suspect this pain is related much more to his complicated pancreatitis then it is escalation of tube feeds.  60 cc/h is a small volume of fluid going into the digestive tract, especially since he reportedly had good bowel function earlier.  Nevertheless, I will stop the tube feedings for several hours and then resume at 30 cc an hour and see how he  does.  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  He is still in for a protracted hospital course, but is still slowly making gains.  Total time 25 minutes. Nelida Meuse III Office: [phone]

## 2020-05-05 NOTE — Progress Notes (Signed)
Physical Therapy Treatment Patient Details Name: Adam Fowler MRN: [id number] DOB: [birth year] Today's Date: 05/05/2020    History of Present Illness Pt is a 64 yo male who presents to the ED with severe abdominal pain, nausea and vomiting. Found to have post ERCP acute pancreatits. Pt developed Hypoxic resp failure and was Intubated 8/22  and extubated 8/24. Pt also with new onset a fib and acute ileus. PMH includes HTN and ERCP.     PT Comments    Pt progressing slowly towards his physical therapy goals. Received with bowel incontinence, assisted onto bedside commode for peri care and gown change. Able to take pivotal steps from [facility] to chair with a walker and min guard assist. Further mobility deferred due to pt pain and fatigue. HR 115-130 bpm, SpO2 > 90% on 4L O2. Cues provided for pursed lip breathing. Pt continues with poor activity tolerance, weakness and balance deficits. Continue to recommend SNF for ongoing Physical Therapy.      Follow Up Recommendations  SNF     Equipment Recommendations  Rolling walker with 5" wheels;3in1 (PT)    Recommendations for Other Services       Precautions / Restrictions Precautions Precautions: Fall Restrictions Weight Bearing Restrictions: No    Mobility  Bed Mobility Overal bed mobility: Needs Assistance Bed Mobility: Supine to Sit     Supine to sit: Supervision        Transfers Overall transfer level: Needs assistance Equipment used: Rolling walker (2 wheeled) Transfers: Stand Pivot Transfers;Sit to/from Stand Sit to Stand: Min guard Stand pivot transfers: Min guard       General transfer comment: Min guard to rise and steady, pivot towards right onto [facility], then able to take pivotal steps to chair   Ambulation/Gait                 Stairs             Wheelchair Mobility    Modified Rankin (Stroke Patients Only)       Balance Overall balance assessment: Needs assistance   Sitting balance-Leahy Scale:  Fair     Standing balance support: Bilateral upper extremity supported;During functional activity Standing balance-Leahy Scale: Poor Standing balance comment: reliant on RW in standing                            Cognition Arousal/Alertness: Awake/alert Behavior During Therapy: WFL for tasks assessed/performed Overall Cognitive Status: Impaired/Different from baseline Area of Impairment: Problem solving;Safety/judgement                         Safety/Judgement: Decreased awareness of safety;Decreased awareness of deficits   Problem Solving: Slow processing;Difficulty sequencing;Decreased initiation;Requires verbal cues        Exercises      General Comments        Pertinent Vitals/Pain Pain Assessment: Faces Faces Pain Scale: Hurts even more Pain Location: abdomen Pain Descriptors / Indicators: Grimacing;Guarding Pain Intervention(s): Limited activity within patient's tolerance;Monitored during session    Home Living                      Prior Function            PT Goals (current goals can now be found in the care plan section) Acute Rehab PT Goals Patient Stated Goal: to go back to work Potential to Achieve Goals: Good    Frequency  Min 2X/week      PT Plan Current plan remains appropriate    Co-evaluation              AM-PAC PT "6 Clicks" Mobility   Outcome Measure  Help needed turning from your back to your side while in a flat bed without using bedrails?: None Help needed moving from lying on your back to sitting on the side of a flat bed without using bedrails?: None Help needed moving to and from a bed to a chair (including a wheelchair)?: A Little Help needed standing up from a chair using your arms (e.g., wheelchair or bedside chair)?: A Little Help needed to walk in hospital room?: A Lot Help needed climbing 3-5 steps with a railing? : A Lot 6 Click Score: 18    End of Session Equipment Utilized  During Treatment: Oxygen Activity Tolerance: Patient limited by fatigue Patient left: in chair;with call bell/phone within reach;with chair alarm set Nurse Communication: Mobility status PT Visit Diagnosis: Unsteadiness on feet (R26.81);Muscle weakness (generalized) (M62.81);Difficulty in walking, not elsewhere classified (R26.2)     Time: 8337-4451 PT Time Calculation (min) (ACUTE ONLY): 32 min  Charges:  $Therapeutic Activity: 23-37 mins                       Wyona Almas, PT, DPT Acute Rehabilitation Services Pager [phone] Office [phone]    Adam Fowler 05/05/2020, 10:38 AM

## 2020-05-05 NOTE — Plan of Care (Signed)
  Problem: Activity: Goal: Risk for activity intolerance will decrease Outcome: Progressing   Problem: Clinical Measurements: Goal: Respiratory complications will improve Outcome: Progressing   Problem: Clinical Measurements: Goal: Will remain free from infection Outcome: Progressing   Problem: Nutrition: Goal: Adequate nutrition will be maintained Outcome: Progressing   Problem: Elimination: Goal: Will not experience complications related to bowel motility Outcome: Progressing Goal: Will not experience complications related to urinary retention Outcome: Progressing   Problem: Safety: Goal: Ability to remain free from injury will improve Outcome: Progressing   Problem: Pain Managment: Goal: General experience of comfort will improve Outcome: Progressing   Problem: Skin Integrity: Goal: Risk for impaired skin integrity will decrease Outcome: Progressing

## 2020-05-05 NOTE — Progress Notes (Signed)
PROGRESS NOTE    Adam Fowler  NGE:[id number] DOB: 02-22-1956 DOA: 04/26/2020 PCP: Melony Overly, MD    Brief Narrative:  Adam Fowler is a 64 year old male with past medical history notable for essential hypertension who underwent ERCP on 04/25/2020 with biliary stent exchange for persistent postoperative bile leak after recent cholecystectomy with abscess status post percutaneous drainage who presented to the ED with severe epigastric abdominal pain, nausea, vomiting since ERCP procedure.  He is unable to tolerate any p.o. intake and his urine is dark in color with significant abdominal distention.  In the ED he was noted to be afebrile, tachycardic.  WBC count 41.3, hemoglobin 19.3, bicarb 16, anion gap 17, glucose 191, BUN 36, creatinine 1.4 (baseline 0.7), high sensitive troponin negative x2.  Lactic acid 8.4>4.6, lipase 1379, AST 102, ALT 48, alk phos 69, T bili 3.9.  SARS-CoV-2 2 PCR negative.  CT abdomen/pelvis with findings consistent with acute pancreatitis, no drainable fluid collection or abscess.     Assessment & Plan:   Principal Problem:   Post-ERCP acute pancreatitis Active Problems:   Severe sepsis (HCC)   Polycythemia   AKI (acute kidney injury) ([place])   Metabolic acidosis   Acute respiratory failure with hypoxia (HCC)   Bilateral pleural effusion   Acute pancreatitis post ERCP Patient presenting following ERCP with nausea, vomiting, severe abdominal pain with associated distention.  Elevated lipase of 1300.  CT abdomen/pelvis with findings consistent with acute pancreatitis; no abscess or drainable fluid collection noted. --[facility] folllowing, appreciate assistance --Lipase 1379>>>20 --Repeat CT abdomen/pelvis 05/02/2020 with markedly diffuse pancreatic inflammation with fat stranding and free fluid, no abscess or drainable fluid collection  --Blood cultures no growth x5 days --Treated with Zosyn.  Finished 7 days of therapy.  Changed to meropenem on  8/27. --Prolonged poor oral intake.   --Started on tube feeding through Dobbhoff tube 8/27. --Daily monitoring of electrolytes and renal functions and aggressive replacement.  Acute ileus Patient continues with abdominal distention due to pancreatitis.  KUB 05/01/2020 shows normal gas pattern.   --Tolerating tube feeding.  Had incontinence of stool today. -- Aggressive potassium and magnesium replacement. --Will avoid opiates, will try Toradol.  Acute hypoxic respiratory failure secondary to volume overload During hospitalization, patient developed significant hypoxia likely secondary to volume overload from aggressive fluid resuscitation to treat his underlying pancreatitis.  Patient was successfully extubated on 05/01/2020. --Continue supple oxygen, maintain SPO2 greater than 92%, currently on 4 L on mobility.  Room air at rest. --BiPAP as needed --Was on Lasix trial.  Severe hypokalemia.  Discontinued. --Incentive spirometry --Strict I's and O's and daily weights --Mobilize with PT/OT  Acute renal failure: Resolved Creatinine 1.4 on admission with a baseline of 0.7.  Improved with IV fluid hydration and antibiotics.  Creatinine 0.84 today. --Continue monitor renal function closely daily  New onset proximal A. Fib During hospitalization, patient developed new onset atrial fibrillation.  In which she has converted now back to normal sinus rhythm.  CHA2DS2-VASc score = 1.  Was initially started on amiodarone --Continue aspirin 81 mg p.o. daily --Monitor on telemetry  Hypokalemia Potassium persistently low.  Lasix discontinued.  Still not enough.  Continue aggressive replacement and recheck levels tomorrow morning.     Weakness/debility:  --PT/OT following, currently recommending SNF   DVT prophylaxis: Lovenox Code Status: Full code Family Communication: Wife at the bedside 8/27.  Disposition Plan:  Status is: Inpatient  Remains inpatient appropriate because:Persistent severe  electrolyte disturbances, Ongoing active pain requiring inpatient pain  management, Ongoing diagnostic testing needed not appropriate for outpatient work up, Unsafe d/c plan, IV treatments appropriate due to intensity of illness or inability to take PO and Inpatient level of care appropriate due to severity of illness continues to not tolerate oral intake, persistent back pain   Dispo: The patient is from: Home              Anticipated d/c is to: SNF              Anticipated d/c date is: > 3 days              Patient currently is not medically stable to d/c.     Consultants:   PCCM - signed off 05/02/2020  [facility]  Procedures:   Intubation 04/29/2020, extubation 05/01/2020  Antimicrobials:   Zosyn 8/19-8/26  Meropenem, 8/27---   Subjective: Patient seen and examined.  No overnight events.  Continues to have back pain not the abdominal pain.  Has some distention but not unusual.  Tolerating tube feeding.  Was not sure about bowel movement, however he had incontinence while getting out with physical therapist.  Objective: Vitals:   05/04/20 1420 05/04/20 2103 05/05/20 0500 05/05/20 0730  BP: 135/77 139/78 118/72 119/74  Pulse: (!) 101 100 100 85  Resp: 17 18 17 17   Temp: 98.5 F (36.9 C) 98.1 F (36.7 C) (!) 97.5 F (36.4 C) 98.2 F (36.8 C)  TempSrc: Oral Oral Oral Oral  SpO2: (!) 89% 97% 97% 96%  Weight:      Height:        Intake/Output Summary (Last 24 hours) at 05/05/2020 1205 Last data filed at 05/05/2020 0900 Gross per 24 hour  Intake 923.01 ml  Output 870 ml  Net 53.01 ml   Filed Weights   05/01/20 0500 05/02/20 0500 05/04/20 0500  Weight: 86.5 kg 86.1 kg 82.9 kg    Examination:  General exam: Appears calm, sick looking.  On 1 to 2 L oxygen. Respiratory system: Clear to auscultation. Respiratory effort normal.  Poor bilateral air entry. Cardiovascular system: S1 & S2 heard, RRR. No JVD, murmurs, rubs, gallops or clicks. No pedal edema. Gastrointestinal  system: Abdomen is distended, with mild tenderness epigastrium and right upper quadrant. No organomegaly or masses felt.  Bowel sounds present and sluggish. Central nervous system: Alert and oriented. No focal neurological deficits. Extremities: Symmetric 5 x 5 power. Skin: No rashes, lesions or ulcers Psychiatry: Anxious with flat affect.    Data Reviewed: I have personally reviewed following labs and imaging studies  CBC: Recent Labs  Lab 05/01/20 0429 05/01/20 0429 05/01/20 1411 05/02/20 0509 05/03/20 0355 05/04/20 0351 05/05/20 0324  WBC 21.7*  --   --  26.9* 29.2* 32.3* 31.4*  NEUTROABS 18.4*  --   --  23.4* 25.3* 28.6* 27.8*  HGB 12.5*   < > 13.4 12.9* 13.6 13.2 13.3  HCT 38.5*   < > 41.9 40.6 40.1 39.5 40.4  MCV 90.6  --   --  89.4 87.0 87.4 89.6  PLT 169  --   --  193 205 219 237   < > = values in this interval not displayed.   Basic Metabolic Panel: Recent Labs  Lab 05/01/20 0429 05/02/20 0509 05/03/20 0355 05/03/20 1602 05/04/20 0351 05/05/20 0324  NA 145 144 139  --  136 135  K 3.1* 3.1* 2.3*  --  2.4* 3.0*  CL 109 105 102  --  100 100  CO2 22 23  22  --  24 24  GLUCOSE 130* 165* 164*  --  131* 194*  BUN 35* 30* 24*  --  17 17  CREATININE 1.29* 1.10 0.86  --  0.76 0.84  CALCIUM 7.6* 8.2* 7.8*  --  7.6* 7.5*  MG 2.0 2.1 1.8  --  2.1 2.1  PHOS 3.5 3.3 2.4* 3.2  --  3.4   GFR: Estimated Creatinine Clearance: 88.8 mL/min (by C-G formula based on SCr of 0.84 mg/dL). Liver Function Tests: Recent Labs  Lab 04/30/20 0125 05/01/20 0429 05/02/20 0509 05/03/20 0355 05/05/20 0324  AST 33 37 44* 43* 32  ALT 24 23 24 25 22   ALKPHOS 59 65 87 101 94  BILITOT 1.5* 1.4* 1.6* 1.3* 0.8  PROT 5.5* 5.3* 5.6* 5.5* 4.9*  ALBUMIN 2.3* 2.0* 2.1* 2.0* 1.7*   Recent Labs  Lab 04/29/20 0202 04/30/20 0125 05/01/20 0429 05/02/20 0509 05/03/20 0355  LIPASE 45 23 19 18 20    No results for input(s): AMMONIA in the last 168 hours. Coagulation Profile: No results for  input(s): INR, PROTIME in the last 168 hours. Cardiac Enzymes: No results for input(s): CKTOTAL, CKMB, CKMBINDEX, TROPONINI in the last 168 hours. BNP (last 3 results) No results for input(s): PROBNP in the last 8760 hours. HbA1C: No results for input(s): HGBA1C in the last 72 hours. CBG: Recent Labs  Lab 05/04/20 2039 05/04/20 2352 05/05/20 0356 05/05/20 0750 05/05/20 1118  GLUCAP 182* 158* 183* 237* 255*   Lipid Profile: No results for input(s): CHOL, HDL, LDLCALC, TRIG, CHOLHDL, LDLDIRECT in the last 72 hours. Thyroid Function Tests: No results for input(s): TSH, T4TOTAL, FREET4, T3FREE, THYROIDAB in the last 72 hours. Anemia Panel: No results for input(s): VITAMINB12, FOLATE, FERRITIN, TIBC, IRON, RETICCTPCT in the last 72 hours. Sepsis Labs: Recent Labs  Lab 04/29/20 0202 04/30/20 0125  PROCALCITON 4.16 4.07    Recent Results (from the past 240 hour(s))  Blood culture (routine x 2)     Status: None   Collection Time: 04/26/20  5:16 PM   Specimen: BLOOD  Result Value Ref Range Status   Specimen Description BLOOD LEFT ANTECUBITAL  Final   Special Requests   Final    BOTTLES DRAWN AEROBIC AND ANAEROBIC Blood Culture adequate volume   Culture   Final    NO GROWTH 5 DAYS Performed at [facility], [address], [place], [place] [address]    Report Status 05/01/2020 FINAL  Final  Blood culture (routine x 2)     Status: None   Collection Time: 04/26/20  5:23 PM   Specimen: BLOOD  Result Value Ref Range Status   Specimen Description BLOOD RIGHT ANTECUBITAL  Final   Special Requests   Final    BOTTLES DRAWN AEROBIC AND ANAEROBIC Blood Culture results may not be optimal due to an inadequate volume of blood received in culture bottles   Culture   Final    NO GROWTH 5 DAYS Performed at [facility], [place] [address], [place], [place] [address]    Report Status 05/01/2020 FINAL  Final  SARS Coronavirus 2 by RT PCR (hospital order, performed in [facility]  hospital lab) Nasopharyngeal Nasopharyngeal Swab     Status: None   Collection Time: 04/26/20  9:50 PM   Specimen: Nasopharyngeal Swab  Result Value Ref Range Status   SARS Coronavirus 2 NEGATIVE NEGATIVE Final    Comment: (NOTE) SARS-CoV-2 target nucleic acids are NOT DETECTED.  The SARS-CoV-2 RNA is generally  detectable in upper and lower respiratory specimens during the acute phase of infection. The lowest concentration of SARS-CoV-2 viral copies this assay can detect is 250 copies / mL. A negative result does not preclude SARS-CoV-2 infection and should not be used as the sole basis for treatment or other patient management decisions.  A negative result may occur with improper specimen collection / handling, submission of specimen other than nasopharyngeal swab, presence of viral mutation(s) within the areas targeted by this assay, and inadequate number of viral copies (<250 copies / mL). A negative result must be combined with clinical observations, patient history, and epidemiological information.  Fact Sheet for Patients:   StrictlyIdeas.no  Fact Sheet for Healthcare Providers: BankingDealers.co.za  This test is not yet approved or  cleared by the [place] FDA and has been authorized for detection and/or diagnosis of SARS-CoV-2 by FDA under an Emergency Use Authorization (EUA).  This EUA will remain in effect (meaning this test can be used) for the duration of the COVID-19 declaration under Section 564(b)(1) of the Act, 21 U.S.C. section 360bbb-3(b)(1), unless the authorization is terminated or revoked sooner.  Performed at [facility], [place] [address] [place], [place] [address]   MRSA PCR Screening     Status: None   Collection Time: 04/30/20 12:10 AM   Specimen: Nasal Mucosa; Nasopharyngeal  Result Value Ref Range Status   MRSA by PCR NEGATIVE NEGATIVE Final    Comment:        The GeneXpert MRSA Assay  (FDA approved for NASAL specimens only), is one component of a comprehensive MRSA colonization surveillance program. It is not intended to diagnose MRSA infection nor to guide or monitor treatment for MRSA infections. Performed at [facility], [place] [address], [place], [place] [address]          Radiology Studies: DG Abd 1 View  Result Date: 05/04/2020 CLINICAL DATA:  Feeding tube placement by [facility] team EXAM: ABDOMEN - 1 VIEW COMPARISON:  CT 05/03/2020 FINDINGS: Feeding tube tip is post pyloric and likely at the junction of the second and third portions of the duodenum. Contrast is noted within the more distal duodenum. Partially imaged pigtail drainage catheter and common bile duct stent. IMPRESSION: Feeding tube tip is post pyloric and likely at the junction of the second and third portions of the duodenum. Electronically Signed   By: Margaretha Sheffield MD   On: 05/04/2020 15:11        Scheduled Meds: . acidophilus  2 capsule Oral Daily  . Chlorhexidine Gluconate Cloth  6 each Topical Daily  . enoxaparin (LOVENOX) injection  40 mg Subcutaneous Q24H  . feeding supplement  1 Container Oral TID BM  . feeding supplement (PROSource TF)  45 mL Per Tube TID  . feeding supplement (VITAL 1.5 CAL)  1,000 mL Per Tube Q24H  . insulin aspart  0-15 Units Subcutaneous Q4H  . mouth rinse  15 mL Mouth Rinse BID  . multivitamin with minerals  1 tablet Oral Daily  . [START ON 05/06/2020] pantoprazole  40 mg Oral Daily  . potassium chloride  40 mEq Oral BID   Continuous Infusions: . sodium chloride 10 mL/hr at 04/30/20 1835  . sodium chloride 250 mL (05/04/20 2216)  . meropenem (MERREM) IV 1 g (05/05/20 0516)  . methocarbamol (ROBAXIN) IV 500 mg (05/05/20 0122)     LOS: 8 days    Time spent: 32 minutes

## 2020-05-06 DIAGNOSIS — Z4659 Encounter for fitting and adjustment of other gastrointestinal appliance and device: Secondary | ICD-10-CM

## 2020-05-06 LAB — CBC WITH DIFFERENTIAL/PLATELET
Abs Immature Granulocytes: 1 10*3/uL — ABNORMAL HIGH (ref 0.00–0.07)
Basophils Absolute: 0 10*3/uL (ref 0.0–0.1)
Basophils Relative: 0 %
Eosinophils Absolute: 0.1 10*3/uL (ref 0.0–0.5)
Eosinophils Relative: 0 %
HCT: 37.5 % — ABNORMAL LOW (ref 39.0–52.0)
Hemoglobin: 12.3 g/dL — ABNORMAL LOW (ref 13.0–17.0)
Immature Granulocytes: 3 %
Lymphocytes Relative: 3 %
Lymphs Abs: 0.8 10*3/uL (ref 0.7–4.0)
MCH: 29.1 pg (ref 26.0–34.0)
MCHC: 32.8 g/dL (ref 30.0–36.0)
MCV: 88.9 fL (ref 80.0–100.0)
Monocytes Absolute: 1.2 10*3/uL — ABNORMAL HIGH (ref 0.1–1.0)
Monocytes Relative: 4 %
Neutro Abs: 27.9 10*3/uL — ABNORMAL HIGH (ref 1.7–7.7)
Neutrophils Relative %: 90 %
Platelets: 279 10*3/uL (ref 150–400)
RBC: 4.22 MIL/uL (ref 4.22–5.81)
RDW: 15 % (ref 11.5–15.5)
WBC: 31.1 10*3/uL — ABNORMAL HIGH (ref 4.0–10.5)
nRBC: 0 % (ref 0.0–0.2)

## 2020-05-06 LAB — COMPREHENSIVE METABOLIC PANEL
ALT: 20 U/L (ref 0–44)
AST: 31 U/L (ref 15–41)
Albumin: 1.6 g/dL — ABNORMAL LOW (ref 3.5–5.0)
Alkaline Phosphatase: 102 U/L (ref 38–126)
Anion gap: 11 (ref 5–15)
BUN: 19 mg/dL (ref 8–23)
CO2: 20 mmol/L — ABNORMAL LOW (ref 22–32)
Calcium: 7.6 mg/dL — ABNORMAL LOW (ref 8.9–10.3)
Chloride: 103 mmol/L (ref 98–111)
Creatinine, Ser: 0.67 mg/dL (ref 0.61–1.24)
GFR calc Af Amer: >60 mL/min (ref >60)
GFR calc non Af Amer: >60 mL/min (ref >60)
Glucose, Bld: 186 mg/dL — ABNORMAL HIGH (ref 70–99)
Potassium: 3.6 mmol/L (ref 3.5–5.1)
Sodium: 134 mmol/L — ABNORMAL LOW (ref 135–145)
Total Bilirubin: 0.6 mg/dL (ref 0.3–1.2)
Total Protein: 4.8 g/dL — ABNORMAL LOW (ref 6.5–8.1)

## 2020-05-06 LAB — GLUCOSE, CAPILLARY
Glucose-Capillary: 162 mg/dL — ABNORMAL HIGH (ref 70–99)
Glucose-Capillary: 167 mg/dL — ABNORMAL HIGH (ref 70–99)
Glucose-Capillary: 169 mg/dL — ABNORMAL HIGH (ref 70–99)
Glucose-Capillary: 187 mg/dL — ABNORMAL HIGH (ref 70–99)
Glucose-Capillary: 228 mg/dL — ABNORMAL HIGH (ref 70–99)

## 2020-05-06 MED ORDER — ZOLPIDEM TARTRATE 5 MG PO TABS
5.0000 mg | ORAL_TABLET | Freq: Once | ORAL | Status: AC
  Administered 2020-05-07: 5 mg via ORAL
  Filled 2020-05-06: qty 1

## 2020-05-06 NOTE — Plan of Care (Signed)
  Problem: Education: Goal: Knowledge of General Education information will improve Description: Including pain rating scale, medication(s)/side effects and non-pharmacologic comfort measures Outcome: Progressing   Problem: Activity: Goal: Risk for activity intolerance will decrease Outcome: Progressing   Problem: Nutrition: Goal: Adequate nutrition will be maintained Outcome: Progressing   Problem: Elimination: Goal: Will not experience complications related to bowel motility Outcome: Progressing   Problem: Pain Managment: Goal: General experience of comfort will improve Outcome: Progressing   Problem: Skin Integrity: Goal: Risk for impaired skin integrity will decrease Outcome: Progressing

## 2020-05-06 NOTE — Plan of Care (Signed)
  Problem: Education: Goal: Knowledge of General Education information will improve Description: Including pain rating scale, medication(s)/side effects and non-pharmacologic comfort measures 05/06/2020 0742 by Claire Shown, RN Outcome: Progressing 05/06/2020 0736 by Claire Shown, RN Outcome: Progressing   Problem: Health Behavior/Discharge Planning: Goal: Ability to manage health-related needs will improve Outcome: Progressing   Problem: Clinical Measurements: Goal: Diagnostic test results will improve Outcome: Progressing   Problem: Clinical Measurements: Goal: Respiratory complications will improve Outcome: Progressing   Problem: Activity: Goal: Risk for activity intolerance will decrease 05/06/2020 0742 by Claire Shown, RN Outcome: Progressing 05/06/2020 0736 by Claire Shown, RN Outcome: Progressing

## 2020-05-06 NOTE — Progress Notes (Addendum)
PROGRESS NOTE    Adam Fowler  LAG:[id number] DOB: 1956/02/26 DOA: 04/26/2020 PCP: Melony Overly, MD    Brief Narrative:  Adam Fowler is a 64 year old male with past medical history notable for essential hypertension who underwent ERCP on 04/25/2020 with biliary stent exchange for persistent postoperative bile leak after recent cholecystectomy with abscess status post percutaneous drainage who presented to the ED with severe epigastric abdominal pain, nausea, vomiting since ERCP procedure.  He is unable to tolerate any p.o. intake and his urine is dark in color with significant abdominal distention.  In the ED he was noted to be afebrile, tachycardic.  WBC count 41.3, hemoglobin 19.3, bicarb 16, anion gap 17, glucose 191, BUN 36, creatinine 1.4 (baseline 0.7), high sensitive troponin negative x2.  Lactic acid 8.4>4.6, lipase 1379, AST 102, ALT 48, alk phos 69, T bili 3.9.  SARS-CoV-2 2 PCR negative.  CT abdomen/pelvis with findings consistent with acute pancreatitis, no drainable fluid collection or abscess.     Assessment & Plan:   Principal Problem:   Post-ERCP acute pancreatitis Active Problems:   Severe sepsis (HCC)   Polycythemia   AKI (acute kidney injury) ([facility])   Metabolic acidosis   Acute respiratory failure with hypoxia (HCC)   Bilateral pleural effusion   Acute pancreatitis post ERCP Patient presenting following ERCP with nausea, vomiting, severe abdominal pain with associated distention.  Elevated lipase of 1300.  CT abdomen/pelvis with findings consistent with acute pancreatitis; no abscess or drainable fluid collection noted. --[facility] folllowing, appreciate assistance --Lipase 1379>>>20 --Repeat CT abdomen/pelvis 05/02/2020 with markedly diffuse pancreatic inflammation with fat stranding and free fluid, no abscess or drainable fluid collection  --Blood cultures no growth x5 days --Treated with Zosyn.  Finished 7 days of therapy.  Changed to meropenem on  8/27. --Prolonged poor oral intake.   --Started on tube feeding through [place] tube 8/27. --Daily monitoring of electrolytes and renal functions and aggressive replacement. --May need to consider TPN if has difficulty tolerating tube feeding  Acute ileus Patient continues with abdominal distention due to pancreatitis.  KUB 05/01/2020 shows normal gas pattern.   --Tolerating tube feeding.   --had multiple bowel movements on 8/28 -- Aggressive potassium and magnesium replacement. --Will avoid opiates, will try Toradol.  Acute hypoxic respiratory failure secondary to volume overload During hospitalization, patient developed significant hypoxia likely secondary to volume overload from aggressive fluid resuscitation to treat his underlying pancreatitis.  Patient was successfully extubated on 05/01/2020. --Continue supple oxygen, maintain SPO2 greater than 92%, currently on 4 L on mobility.  Room air at rest. --BiPAP as needed --Was on Lasix trial.  Severe hypokalemia.  Discontinued. --Incentive spirometry --Strict I's and O's and daily weights --Mobilize with PT/OT  Acute renal failure: Resolved Creatinine 1.4 on admission with a baseline of 0.6.  Improved with IV fluid hydration and antibiotics.   --Continue monitor renal function closely daily  New onset proximal A. Fib During hospitalization, patient developed new onset atrial fibrillation.  In which she has converted now back to normal sinus rhythm.  CHA2DS2-VASc score = 1.  Was initially started on amiodarone --Continue aspirin 81 mg p.o. daily --Monitor on telemetry  Hypokalemia Potassium persistently low.  Lasix discontinued.  Still not enough.  Continue aggressive replacement and recheck levels tomorrow morning.     Weakness/debility:  --PT/OT following, currently recommending SNF  Leukocytosis --no fevers at this time --blood cultures from 8/19 with no growth. Can repeat if becomes febrile --possibly reactive to underlying  pancreatitis --does not  appear septic or toxic --continue follow   DVT prophylaxis: Lovenox Code Status: Full code Family Communication: Wife at the bedside 8/27.  Disposition Plan:  Status is: Inpatient  Remains inpatient appropriate because:Persistent severe electrolyte disturbances, Ongoing active pain requiring inpatient pain management, Ongoing diagnostic testing needed not appropriate for outpatient work up, Unsafe d/c plan, IV treatments appropriate due to intensity of illness or inability to take PO and Inpatient level of care appropriate due to severity of illness continues to not tolerate oral intake, persistent back pain   Dispo: The patient is from: Home              Anticipated d/c is to: SNF              Anticipated d/c date is: > 3 days              Patient currently is not medically stable to d/c.     Consultants:   PCCM - signed off 05/02/2020  [facility]  Procedures:   Intubation 04/29/2020, extubation 05/01/2020  Antimicrobials:   Zosyn 8/19-8/26  Meropenem, 8/27---   Subjective: Continues to have back pain. Feels back pain is improved when he sits up in chair. Continues to have abdominal pain, but appears to be tolerating tube feedings. Had multiple bowel movements yesterday and reports being able to pass gas today.  Objective: Vitals:   05/05/20 2005 05/06/20 0440 05/06/20 0725 05/06/20 1403  BP: 128/74 126/77 126/74 125/84  Pulse: 84 98 79 82  Resp: 17 18 17 16   Temp: (!) 97.5 F (36.4 C) (!) 97.3 F (36.3 C) 97.8 F (36.6 C) 98.4 F (36.9 C)  TempSrc: Oral Oral Oral Oral  SpO2: 97% 98% 98% 96%  Weight:      Height:        Intake/Output Summary (Last 24 hours) at 05/06/2020 1752 Last data filed at 05/06/2020 1620 Gross per 24 hour  Intake 1233.44 ml  Output 695 ml  Net 538.44 ml   Filed Weights   05/01/20 0500 05/02/20 0500 05/04/20 0500  Weight: 86.5 kg 86.1 kg 82.9 kg    Examination:  General exam: Alert, awake, oriented x  3 Respiratory system: Clear to auscultation. Respiratory effort normal. Cardiovascular system:RRR. No murmurs, rubs, gallops. Gastrointestinal system: Abdomen is distended, soft and diffusely tender. No organomegaly or masses felt. Normal bowel sounds heard. Drain present in [employer] nervous system: Alert and oriented. No focal neurological deficits. Extremities: No C/C/E, +pedal pulses Skin: No rashes, lesions or ulcers Psychiatry: Judgement and insight appear normal. Mood & affect appropriate.      Data Reviewed: I have personally reviewed following labs and imaging studies  CBC: Recent Labs  Lab 05/02/20 0509 05/03/20 0355 05/04/20 0351 05/05/20 0324 05/06/20 0146  WBC 26.9* 29.2* 32.3* 31.4* 31.1*  NEUTROABS 23.4* 25.3* 28.6* 27.8* 27.9*  HGB 12.9* 13.6 13.2 13.3 12.3*  HCT 40.6 40.1 39.5 40.4 37.5*  MCV 89.4 87.0 87.4 89.6 88.9  PLT 193 205 219 237 128   Basic Metabolic Panel: Recent Labs  Lab 05/01/20 0429 05/01/20 0429 05/02/20 0509 05/03/20 0355 05/03/20 1602 05/04/20 0351 05/05/20 0324 05/06/20 0146  NA 145   < > 144 139  --  136 135 134*  K 3.1*   < > 3.1* 2.3*  --  2.4* 3.0* 3.6  CL 109   < > 105 102  --  100 100 103  CO2 22   < > 23 22  --  24 24 20*  GLUCOSE 130*   < > 165* 164*  --  131* 194* 186*  BUN 35*   < > 30* 24*  --  17 17 19   CREATININE 1.29*   < > 1.10 0.86  --  0.76 0.84 0.67  CALCIUM 7.6*   < > 8.2* 7.8*  --  7.6* 7.5* 7.6*  MG 2.0  --  2.1 1.8  --  2.1 2.1  --   PHOS 3.5  --  3.3 2.4* 3.2  --  3.4  --    < > = values in this interval not displayed.   GFR: Estimated Creatinine Clearance: 93.3 mL/min (by C-G formula based on SCr of 0.67 mg/dL). Liver Function Tests: Recent Labs  Lab 05/01/20 0429 05/02/20 0509 05/03/20 0355 05/05/20 0324 05/06/20 0146  AST 37 44* 43* 32 31  ALT 23 24 25 22 20   ALKPHOS 65 87 101 94 102  BILITOT 1.4* 1.6* 1.3* 0.8 0.6  PROT 5.3* 5.6* 5.5* 4.9* 4.8*  ALBUMIN 2.0* 2.1* 2.0* 1.7* 1.6*    Recent Labs  Lab 04/30/20 0125 05/01/20 0429 05/02/20 0509 05/03/20 0355  LIPASE 23 19 18 20    No results for input(s): AMMONIA in the last 168 hours. Coagulation Profile: No results for input(s): INR, PROTIME in the last 168 hours. Cardiac Enzymes: No results for input(s): CKTOTAL, CKMB, CKMBINDEX, TROPONINI in the last 168 hours. BNP (last 3 results) No results for input(s): PROBNP in the last 8760 hours. HbA1C: No results for input(s): HGBA1C in the last 72 hours. CBG: Recent Labs  Lab 05/06/20 0030 05/06/20 0438 05/06/20 0806 05/06/20 1125 05/06/20 1616  GLUCAP 162* 167* 187* 228* 169*   Lipid Profile: No results for input(s): CHOL, HDL, LDLCALC, TRIG, CHOLHDL, LDLDIRECT in the last 72 hours. Thyroid Function Tests: No results for input(s): TSH, T4TOTAL, FREET4, T3FREE, THYROIDAB in the last 72 hours. Anemia Panel: No results for input(s): VITAMINB12, FOLATE, FERRITIN, TIBC, IRON, RETICCTPCT in the last 72 hours. Sepsis Labs: Recent Labs  Lab 04/30/20 0125  PROCALCITON 4.07    Recent Results (from the past 240 hour(s))  SARS Coronavirus 2 by RT PCR (hospital order, performed in [facility] hospital lab) Nasopharyngeal Nasopharyngeal Swab     Status: None   Collection Time: 04/26/20  9:50 PM   Specimen: Nasopharyngeal Swab  Result Value Ref Range Status   SARS Coronavirus 2 NEGATIVE NEGATIVE Final    Comment: (NOTE) SARS-CoV-2 target nucleic acids are NOT DETECTED.  The SARS-CoV-2 RNA is generally detectable in upper and lower respiratory specimens during the acute phase of infection. The lowest concentration of SARS-CoV-2 viral copies this assay can detect is 250 copies / mL. A negative result does not preclude SARS-CoV-2 infection and should not be used as the sole basis for treatment or other patient management decisions.  A negative result may occur with improper specimen collection / handling, submission of specimen other than nasopharyngeal swab,  presence of viral mutation(s) within the areas targeted by this assay, and inadequate number of viral copies (<250 copies / mL). A negative result must be combined with clinical observations, patient history, and epidemiological information.  Fact Sheet for Patients:   StrictlyIdeas.no  Fact Sheet for Healthcare Providers: BankingDealers.co.za  This test is not yet approved or  cleared by the [place] FDA and has been authorized for detection and/or diagnosis of SARS-CoV-2 by FDA under an Emergency Use Authorization (EUA).  This EUA will remain in effect (meaning this test can be used) for the  duration of the COVID-19 declaration under Section 564(b)(1) of the Act, 21 U.S.C. section 360bbb-3(b)(1), unless the authorization is terminated or revoked sooner.  Performed at [facility], [place] [address], [place], [place] [address]   MRSA PCR Screening     Status: None   Collection Time: 04/30/20 12:10 AM   Specimen: Nasal Mucosa; Nasopharyngeal  Result Value Ref Range Status   MRSA by PCR NEGATIVE NEGATIVE Final    Comment:        The GeneXpert MRSA Assay (FDA approved for NASAL specimens only), is one component of a comprehensive MRSA colonization surveillance program. It is not intended to diagnose MRSA infection nor to guide or monitor treatment for MRSA infections. Performed at [facility], [place] [address] [place], [place] [address]          Radiology Studies: No results found.      Scheduled Meds: . acidophilus  2 capsule Oral Daily  . Chlorhexidine Gluconate Cloth  6 each Topical Daily  . enoxaparin (LOVENOX) injection  40 mg Subcutaneous Q24H  . feeding supplement  1 Container Oral TID BM  . feeding supplement (PROSource TF)  45 mL Per Tube TID  . feeding supplement (VITAL 1.5 CAL)  1,000 mL Per Tube Q24H  . insulin aspart  0-15 Units Subcutaneous Q4H  . mouth rinse  15 mL Mouth Rinse BID  .  multivitamin with minerals  1 tablet Oral Daily  . pantoprazole  40 mg Oral Daily  . potassium chloride  40 mEq Oral BID   Continuous Infusions: . sodium chloride 10 mL/hr at 04/30/20 1835  . sodium chloride 10 mL/hr at 05/06/20 1234  . meropenem (MERREM) IV 1 g (05/06/20 1308)  . methocarbamol (ROBAXIN) IV Stopped (05/05/20 0153)     LOS: 9 days    Time spent: 30 minutes

## 2020-05-06 NOTE — Progress Notes (Signed)
Tube feeding restarted at 13:04, 30cc/hr per MD order, rate increased to 40cc/hr at 17:04. At 18:00 Pt started to complain of increasing abdominal pain 9/10 and feels bloated. Encouraged Pt to ambulate, Pt refused at this time but agreed to ambulate later. PRN pain med given. Dr. Roderic Palau notified, rate adjusted to 30cc/hr per MD. Pt states pain at this time is better. Will continue to monitor.

## 2020-05-06 NOTE — Plan of Care (Signed)

## 2020-05-06 NOTE — Progress Notes (Addendum)
Daily Rounding Note  05/06/2020, 12:36 PM  LOS: 9 days   SUBJECTIVE:   Chief complaint: pancreatitis    Several large BM's and flatus yesterday early.  Pain better eraly yesterday but not worse later leading to temporary stop of TFs.  TFs restarted and developed recurrent increase abd pain, so TF stopped and remain off. Some abd pain, no nausea today.  Feels worse than yest AM.  Some bloating.  Not tender to palpation Also still having low back pain.    OBJECTIVE:         Vital signs in last 24 hours:    Temp:  [97.3 F (36.3 C)-98.1 F (36.7 C)] 97.8 F (36.6 C) (08/29 0725) Pulse Rate:  [79-98] 79 (08/29 0725) Resp:  [17-18] 17 (08/29 0725) BP: (122-128)/(74-81) 126/74 (08/29 0725) SpO2:  [95 %-98 %] 98 % (08/29 0725) Last BM Date: 05/06/20 Filed Weights   05/01/20 0500 05/02/20 0500 05/04/20 0500  Weight: 86.5 kg 86.1 kg 82.9 kg   General: looks worn out, moderately ill   Heart: RRR Chest: clear bil.   No cough, a bit SOB w minimal speech Abdomen: moderately tense, BS normal but reduced.   Not TTP.  Extremities: no CCE Neuro/Psych:  Alert.  Oriented x 3.    Intake/Output from previous day: 08/28 0701 - 08/29 0700 In: 1478.4 [P.O.:960; I.V.:174.2; IV Piggyback:344.2] Out: 970 [Urine:870; Drains:100]  Intake/Output this shift: Total I/O In: 360 [P.O.:360] Out: 30 [Drains:30]  Lab Results: Recent Labs    05/04/20 0351 05/05/20 0324 05/06/20 0146  WBC 32.3* 31.4* 31.1*  HGB 13.2 13.3 12.3*  HCT 39.5 40.4 37.5*  PLT 219 237 279   BMET Recent Labs    05/04/20 0351 05/05/20 0324 05/06/20 0146  NA 136 135 134*  K 2.4* 3.0* 3.6  CL 100 100 103  CO2 24 24 20*  GLUCOSE 131* 194* 186*  BUN 17 17 19   CREATININE 0.76 0.84 0.67  CALCIUM 7.6* 7.5* 7.6*   LFT Recent Labs    05/05/20 0324 05/06/20 0146  PROT 4.9* 4.8*  ALBUMIN 1.7* 1.6*  AST 32 31  ALT 22 20  ALKPHOS 94 102  BILITOT 0.8 0.6    PT/INR No results for input(s): LABPROT, INR in the last 72 hours. Hepatitis Panel No results for input(s): HEPBSAG, HCVAB, HEPAIGM, HEPBIGM in the last 72 hours.  Studies/Results: DG Abd 1 View  Result Date: 05/04/2020 CLINICAL DATA:  Feeding tube placement by [facility] team EXAM: ABDOMEN - 1 VIEW COMPARISON:  CT 05/03/2020 FINDINGS: Feeding tube tip is post pyloric and likely at the junction of the second and third portions of the duodenum. Contrast is noted within the more distal duodenum. Partially imaged pigtail drainage catheter and common bile duct stent. IMPRESSION: Feeding tube tip is post pyloric and likely at the junction of the second and third portions of the duodenum. Electronically Signed   By: Margaretha Sheffield MD   On: 05/04/2020 15:11   Scheduled Meds: . acidophilus  2 capsule Oral Daily  . Chlorhexidine Gluconate Cloth  6 each Topical Daily  . enoxaparin (LOVENOX) injection  40 mg Subcutaneous Q24H  . feeding supplement  1 Container Oral TID BM  . feeding supplement (PROSource TF)  45 mL Per Tube TID  . feeding supplement (VITAL 1.5 CAL)  1,000 mL Per Tube Q24H  . insulin aspart  0-15 Units Subcutaneous Q4H  . mouth rinse  15 mL Mouth Rinse BID  .  multivitamin with minerals  1 tablet Oral Daily  . pantoprazole  40 mg Oral Daily  . potassium chloride  40 mEq Oral BID   Continuous Infusions: . sodium chloride 10 mL/hr at 04/30/20 1835  . sodium chloride 10 mL/hr at 05/06/20 1234  . meropenem (MERREM) IV Stopped (05/06/20 0932)  . methocarbamol (ROBAXIN) IV Stopped (05/05/20 0153)   PRN Meds:.HYDROmorphone (DILAUDID) injection, ketorolac, lidocaine, methocarbamol (ROBAXIN) IV, metoprolol tartrate, ondansetron, polyvinyl alcohol  ASSESMENT:   *Bile leak, s/p ERCP/stent replacement 8/18.  Post ERCP pancreatitis. 8/25 CTAP w IV contrast: diffuse pancreatitis, areas necrosis in head/neck but overall appearance similar to 5 d prior CT. No abscess or pseudocyst.  Small ascites. No ileus per CT and KUB x 2. FinishedZosyn8/26. Meropenem started 8/27, day 3. WBCs stable ~ 31 plus, no fevers.LFTs normal.     * Hypokalemiafinally resolved, corrected.  *   Hyperglycemia in setting of pancreatitis and TPN.  SSI novolog in place.      * Deconditioning.  *   Severe protein calorie malnutrition.  IR placed naso duodenal tube and Vital 1.5, prostat started 8/27.  TFs stopped due to increased abd pain.  Daily multivitamin.      PLAN   *   Restart TF at 30 mL, if it causes increased pain, ? proceed to KUB?     *   Add K pad for back pain.      Azucena Freed  05/06/2020, 12:36 PM Phone [phone]  I have discussed the case with the PA, and that is the plan I formulated. I personally interviewed and examined the patient.  As expected, his slow course from severe post ERCP pancreatitis continues.  See prior notes for details, but there is the possibility of superimposed infection for which we started meropenem within the last few days. He was having more pain and bloating yesterday, so tube feeds were stopped, held overnight and then tried again with more pain.  They were just recently resumed.  I backed his diet off to full liquids.  He is also having severe chronic back pain from uncomfortable position.  On a positive note, his external biliary drain has had no output all week, so the biliary stent is working to control the bile leak.  No ileus seen on the repeat CT scan late in the week, but I still suspect he is having intermittent ileus that explains the episodes of bloating and abdominal pain.  Of course he also has severe pancreatitis.  I think he is better off with tube feeds then oral nutrition at this point.  We will try to get the tube feeds back up to goal overnight and hope that with more enteral nutrition it may stimulate bowel activity and we can work through this.  He also needs physical therapy, which will probably be  quite challenging given his debility and back pain.  However, that will help his GI motility..  If we are still not able to provide adequate enteral nutrition in the next couple of days, then we may yet need to use TPN.  Lastly, we need to check with our surgical colleagues on when they feel the external biliary drain can be removed.  Dr. Fuller Plan to assume the consult service tomorrow.  Total time 35 minutes.  Nelida Meuse III Office: [phone]

## 2020-05-06 NOTE — Progress Notes (Signed)
Pt was able to tolerate feeding tube for about 3 hours when  He  Started to complained of pain in his abdomen. Feeding tube was stopped.

## 2020-05-07 ENCOUNTER — Encounter (HOSPITAL_COMMUNITY): Payer: Self-pay | Admitting: Internal Medicine

## 2020-05-07 DIAGNOSIS — K567 Ileus, unspecified: Secondary | ICD-10-CM

## 2020-05-07 LAB — CBC WITH DIFFERENTIAL/PLATELET
Abs Immature Granulocytes: 0.57 10*3/uL — ABNORMAL HIGH (ref 0.00–0.07)
Basophils Absolute: 0.2 10*3/uL — ABNORMAL HIGH (ref 0.0–0.1)
Basophils Relative: 1 %
Eosinophils Absolute: 0.2 10*3/uL (ref 0.0–0.5)
Eosinophils Relative: 1 %
HCT: 36.4 % — ABNORMAL LOW (ref 39.0–52.0)
Hemoglobin: 11.6 g/dL — ABNORMAL LOW (ref 13.0–17.0)
Immature Granulocytes: 2 %
Lymphocytes Relative: 4 %
Lymphs Abs: 1 10*3/uL (ref 0.7–4.0)
MCH: 28.9 pg (ref 26.0–34.0)
MCHC: 31.9 g/dL (ref 30.0–36.0)
MCV: 90.5 fL (ref 80.0–100.0)
Monocytes Absolute: 1 10*3/uL (ref 0.1–1.0)
Monocytes Relative: 4 %
Neutro Abs: 22.2 10*3/uL — ABNORMAL HIGH (ref 1.7–7.7)
Neutrophils Relative %: 88 %
Platelets: 326 10*3/uL (ref 150–400)
RBC: 4.02 MIL/uL — ABNORMAL LOW (ref 4.22–5.81)
RDW: 14.7 % (ref 11.5–15.5)
WBC: 25.2 10*3/uL — ABNORMAL HIGH (ref 4.0–10.5)
nRBC: 0 % (ref 0.0–0.2)

## 2020-05-07 LAB — GLUCOSE, CAPILLARY
Glucose-Capillary: 140 mg/dL — ABNORMAL HIGH (ref 70–99)
Glucose-Capillary: 230 mg/dL — ABNORMAL HIGH (ref 70–99)
Glucose-Capillary: 217 mg/dL — ABNORMAL HIGH (ref 70–99)
Glucose-Capillary: 236 mg/dL — ABNORMAL HIGH (ref 70–99)
Glucose-Capillary: 231 mg/dL — ABNORMAL HIGH (ref 70–99)
Glucose-Capillary: 175 mg/dL — ABNORMAL HIGH (ref 70–99)
Glucose-Capillary: 199 mg/dL — ABNORMAL HIGH (ref 70–99)

## 2020-05-07 LAB — COMPREHENSIVE METABOLIC PANEL
ALT: 19 U/L (ref 0–44)
AST: 31 U/L (ref 15–41)
Albumin: 1.5 g/dL — ABNORMAL LOW (ref 3.5–5.0)
Alkaline Phosphatase: 95 U/L (ref 38–126)
Anion gap: 9 (ref 5–15)
BUN: 15 mg/dL (ref 8–23)
CO2: 21 mmol/L — ABNORMAL LOW (ref 22–32)
Calcium: 7.6 mg/dL — ABNORMAL LOW (ref 8.9–10.3)
Chloride: 103 mmol/L (ref 98–111)
Creatinine, Ser: 0.74 mg/dL (ref 0.61–1.24)
GFR calc Af Amer: >60 mL/min (ref >60)
GFR calc non Af Amer: >60 mL/min (ref >60)
Glucose, Bld: 216 mg/dL — ABNORMAL HIGH (ref 70–99)
Potassium: 4.1 mmol/L (ref 3.5–5.1)
Sodium: 133 mmol/L — ABNORMAL LOW (ref 135–145)
Total Bilirubin: 0.4 mg/dL (ref 0.3–1.2)
Total Protein: 4.7 g/dL — ABNORMAL LOW (ref 6.5–8.1)

## 2020-05-07 MED ORDER — SODIUM CHLORIDE 0.9% FLUSH
10.0000 mL | INTRAVENOUS | Status: DC | PRN
  Administered 2020-05-13 – 2020-06-10 (×6): 10 mL

## 2020-05-07 MED ORDER — SODIUM CHLORIDE 0.9% FLUSH
10.0000 mL | Freq: Two times a day (BID) | INTRAVENOUS | Status: DC
  Administered 2020-05-07 – 2020-06-01 (×32): 10 mL
  Administered 2020-06-02: 20 mL
  Administered 2020-06-02 (×2): 10 mL
  Administered 2020-06-03: 20 mL
  Administered 2020-06-03 – 2020-06-12 (×19): 10 mL

## 2020-05-07 MED ORDER — ZOLPIDEM TARTRATE 5 MG PO TABS
5.0000 mg | ORAL_TABLET | Freq: Once | ORAL | Status: AC
  Administered 2020-05-07: 5 mg via ORAL
  Filled 2020-05-07: qty 1

## 2020-05-07 NOTE — Plan of Care (Signed)

## 2020-05-07 NOTE — Progress Notes (Signed)
Pt did not tolerate an increase in rate via tube feed. Abdomen firm, pt c/o of feeling bloated. Rate remained 30cc throughout the night.

## 2020-05-07 NOTE — Progress Notes (Signed)
PROGRESS NOTE    Adam Fowler  YDX:[id number] DOB: [birth year] DOA: 04/26/2020 PCP: Melony Overly, MD    Brief Narrative:  Adam Fowler is a 64 year old male with past medical history notable for essential hypertension who underwent ERCP on 04/25/2020 with biliary stent exchange for persistent postoperative bile leak after recent cholecystectomy with abscess status post percutaneous drainage who presented to the ED with severe epigastric abdominal pain, nausea, vomiting since ERCP procedure.  He is unable to tolerate any p.o. intake and his urine is dark in color with significant abdominal distention.  In the ED he was noted to be afebrile, tachycardic.  WBC count 41.3, hemoglobin 19.3, bicarb 16, anion gap 17, glucose 191, BUN 36, creatinine 1.4 (baseline 0.7), high sensitive troponin negative x2.  Lactic acid 8.4>4.6, lipase 1379, AST 102, ALT 48, alk phos 69, T bili 3.9.  SARS-CoV-2 2 PCR negative.  CT abdomen/pelvis with findings consistent with acute pancreatitis, no drainable fluid collection or abscess.     Assessment & Plan:   Principal Problem:   Post-ERCP acute pancreatitis Active Problems:   Severe sepsis (HCC)   Polycythemia   AKI (acute kidney injury) ([place])   Metabolic acidosis   Acute respiratory failure with hypoxia (HCC)   Bilateral pleural effusion   Ileus (HCC)   Acute pancreatitis post ERCP Patient presenting following ERCP with nausea, vomiting, severe abdominal pain with associated distention.  Elevated lipase of 1300.  CT abdomen/pelvis with findings consistent with acute pancreatitis; no abscess or drainable fluid collection noted. --Lipase 1379>>>20 --Repeat CT abdomen/pelvis 05/02/2020 with markedly diffuse pancreatic inflammation with fat stranding and free fluid, no abscess or drainable fluid collection  --Blood cultures no growth x5 days --Treated with Zosyn.  Finished 7 days of therapy.  Changed to meropenem on 8/27. --Prolonged poor oral intake.   Initially on TPN. --Started on tube feeding through [place] tube 8/27. --Daily monitoring of electrolytes and renal functions and aggressive replacement. --May need to consider TPN if has difficulty tolerating tube feeding. Had to decrease the rate of his tube feeding yesterday due to abdominal distention and discomfort.  Seems to be stable this morning.  Gastroenterology continues to follow.  Acute ileus Patient continues with abdominal distention due to pancreatitis.  KUB 05/01/2020 shows normal gas pattern.  Seems to have improved.  Currently on tube feedings.  Acute hypoxic respiratory failure secondary to volume overload During hospitalization, patient developed significant hypoxia likely secondary to volume overload from aggressive fluid resuscitation to treat his underlying pancreatitis.  Patient was successfully extubated on 05/01/2020. --Continue supple oxygen, maintain SPO2 greater than 92%, currently on 4 L on mobility.  Room air at rest. --BiPAP as needed --Was on Lasix trial.  Severe hypokalemia.  Lasix was subsequently discontinued.  --Incentive spirometry --Strict I's and O's and daily weights --Mobilize with PT/OT  Acute renal failure Creatinine 1.4 on admission with a baseline of 0.6.  Improved with IV fluid hydration and antibiotics.   Monitor urine output.  Labs have been stable the last few days.  New onset proximal A. Fib During hospitalization, patient developed new onset atrial fibrillation.  Converted to sinus rhythm.  CHA2DS2-VASc score = 1.  Was initially started on amiodarone --Continue aspirin 81 mg p.o. daily --Monitor on telemetry  Hypokalemia Potassium persistently low.  Lasix discontinued.  Potassium being aggressively repleted.  Potassium is better today.  Continue to monitor daily.  Magnesium was 2.1 on 8/28.  Weakness/debility:  --PT/OT following, currently recommending SNF  Leukocytosis --no fevers at this  time --blood cultures from 8/19 with no  growth.  --possibly reactive to underlying pancreatitis --does not appear septic or toxic --continue follow.  WBC noted to be better today.   DVT prophylaxis: Lovenox Code Status: Full code Family Communication: No family at bedside today.  Discussed with the patient. Disposition Plan: Mobilize.  PT is following.  Hopefully return home when improved.  Status is: Inpatient  Remains inpatient appropriate because:Ongoing active pain requiring inpatient pain management, IV treatments appropriate due to intensity of illness or inability to take PO and Inpatient level of care appropriate due to severity of illness   Dispo:  Patient From: Home  Planned Disposition: To be determined  Expected discharge date: 05/11/20  Medically stable for discharge: No        Consultants:   PCCM - signed off 05/02/2020  [facility]  Procedures:   Intubation 04/29/2020, extubation 05/01/2020  Antimicrobials:   Zosyn 8/19-8/26  Meropenem, 8/27---   Subjective: Denies any abdominal discomfort this morning although he did have some pain overnight.  No nausea vomiting.  Has had multiple bowel movements.  Passing gas from below.  Objective: Vitals:   05/06/20 1943 05/07/20 0300 05/07/20 0500 05/07/20 0805  BP: (!) 102/48 125/77  104/68  Pulse: 81 80  94  Resp: 16 17  14   Temp: 98.1 F (36.7 C) 98.5 F (36.9 C)  97.6 F (36.4 C)  TempSrc: Oral Oral  Oral  SpO2: 100% 98%  98%  Weight:   86.6 kg   Height:        Intake/Output Summary (Last 24 hours) at 05/07/2020 1154 Last data filed at 05/07/2020 0700 Gross per 24 hour  Intake 616.11 ml  Output 1125 ml  Net -508.89 ml   Filed Weights   05/02/20 0500 05/04/20 0500 05/07/20 0500  Weight: 86.1 kg 82.9 kg 86.6 kg    Examination:  General appearance: Awake alert.  In no distress Resp: Clear to auscultation bilaterally.  Normal effort Cardio: S1-S2 is normal regular.  No S3-S4.  No rubs murmurs or bruit GI: Abdomen is soft.  Mildly  distended.  Bowel sounds present though sluggish.  No tenderness appreciated.  No masses organomegaly.   Extremities: No edema.  Full range of motion of lower extremities. Neurologic: Alert and oriented x3.  No focal neurological deficits.      Data Reviewed: I have personally reviewed following labs and imaging studies  CBC: Recent Labs  Lab 05/03/20 0355 05/04/20 0351 05/05/20 0324 05/06/20 0146 05/07/20 0432  WBC 29.2* 32.3* 31.4* 31.1* 25.2*  NEUTROABS 25.3* 28.6* 27.8* 27.9* 22.2*  HGB 13.6 13.2 13.3 12.3* 11.6*  HCT 40.1 39.5 40.4 37.5* 36.4*  MCV 87.0 87.4 89.6 88.9 90.5  PLT 205 219 237 279 027   Basic Metabolic Panel: Recent Labs  Lab 05/01/20 0429 05/01/20 0429 05/02/20 0509 05/02/20 0509 05/03/20 0355 05/03/20 1602 05/04/20 0351 05/05/20 0324 05/06/20 0146 05/07/20 0432  NA 145   < > 144   < > 139  --  136 135 134* 133*  K 3.1*   < > 3.1*   < > 2.3*  --  2.4* 3.0* 3.6 4.1  CL 109   < > 105   < > 102  --  100 100 103 103  CO2 22   < > 23   < > 22  --  24 24 20* 21*  GLUCOSE 130*   < > 165*   < > 164*  --  131* 194* 186*  216*  BUN 35*   < > 30*   < > 24*  --  17 17 19 15   CREATININE 1.29*   < > 1.10   < > 0.86  --  0.76 0.84 0.67 0.74  CALCIUM 7.6*   < > 8.2*   < > 7.8*  --  7.6* 7.5* 7.6* 7.6*  MG 2.0  --  2.1  --  1.8  --  2.1 2.1  --   --   PHOS 3.5  --  3.3  --  2.4* 3.2  --  3.4  --   --    < > = values in this interval not displayed.   GFR: Estimated Creatinine Clearance: 101.7 mL/min (by C-G formula based on SCr of 0.74 mg/dL). Liver Function Tests: Recent Labs  Lab 05/02/20 0509 05/03/20 0355 05/05/20 0324 05/06/20 0146 05/07/20 0432  AST 44* 43* 32 31 31  ALT 24 25 22 20 19   ALKPHOS 87 101 94 102 95  BILITOT 1.6* 1.3* 0.8 0.6 0.4  PROT 5.6* 5.5* 4.9* 4.8* 4.7*  ALBUMIN 2.1* 2.0* 1.7* 1.6* 1.5*   Recent Labs  Lab 05/01/20 0429 05/02/20 0509 05/03/20 0355  LIPASE 19 18 20    CBG: Recent Labs  Lab 05/06/20 0806 05/06/20 1125  05/06/20 1616 05/07/20 0341 05/07/20 0842  GLUCAP 187* 228* 169* 217* 236*     Recent Results (from the past 240 hour(s))  MRSA PCR Screening     Status: None   Collection Time: 04/30/20 12:10 AM   Specimen: Nasal Mucosa; Nasopharyngeal  Result Value Ref Range Status   MRSA by PCR NEGATIVE NEGATIVE Final    Comment:        The GeneXpert MRSA Assay (FDA approved for NASAL specimens only), is one component of a comprehensive MRSA colonization surveillance program. It is not intended to diagnose MRSA infection nor to guide or monitor treatment for MRSA infections. Performed at [facility], [place] [address] [place], [place] [address]          Radiology Studies: No results found.      Scheduled Meds: . acidophilus  2 capsule Oral Daily  . Chlorhexidine Gluconate Cloth  6 each Topical Daily  . enoxaparin (LOVENOX) injection  40 mg Subcutaneous Q24H  . feeding supplement  1 Container Oral TID BM  . feeding supplement (PROSource TF)  45 mL Per Tube TID  . feeding supplement (VITAL 1.5 CAL)  1,000 mL Per Tube Q24H  . insulin aspart  0-15 Units Subcutaneous Q4H  . mouth rinse  15 mL Mouth Rinse BID  . multivitamin with minerals  1 tablet Oral Daily  . pantoprazole  40 mg Oral Daily  . potassium chloride  40 mEq Oral BID   Continuous Infusions: . sodium chloride 10 mL/hr at 04/30/20 1835  . sodium chloride 10 mL/hr at 05/06/20 1234  . meropenem (MERREM) IV 1 g (05/07/20 0650)  . methocarbamol (ROBAXIN) IV Stopped (05/05/20 0153)     LOS: 10 days    Bonnielee Haff 05/07/2020

## 2020-05-07 NOTE — Progress Notes (Signed)
Progress Note  Chief Complaint:  Pancreatitis     ASSESSMENT AND PLAN:   # Bile leak, s/p ERCP/stent replacement 8/18.  --minimal to no output from external drain for ~ a week. Currently with ~ 10 cc of clear. No significant fluid collection in gallbladder fossa  --I spoke with P.A. Michael with [facility]. He will get in touch with Surgeon Dr.Lovick to discuss removal of external drain  #  Post ERCP pancreatitis. --8/26 CTAP w IV contrast: diffuse pancreatitis, areas of necrosis in head/neck but overall appearance similar to prior CT. No abscess or pseudocyst. Small ascites. No ileus. --FinishedZosyn8/26.Meropenem started 8/27, day 4. --WBCs improving 31 >>25K. Blood cultures negative. No fevers. LFTs normal.  # Severe protein calorie malnutrition.  --IR placed naso duodenal tube enteral feedings started on 8/27. Feedings had to be stopped due to increased abd pain.  We resumed feedings yesterday. He continues on feedings at 30 ml / hr. No significant abdominal pain but visibly distended with decreased bowel sounds. No ileus on CT scan several days ago but based on exam it is probable. No nausea, + BMs. Will continue TF for now. If worsening distention, if he develops abdominal pain or nausea then will stop TF and start TNA --Has been getting PROSource and Boost. / Breeze.    # Deconditioning --Needs PT   # New onset Atrial fibrillation, this admit   SUBJECTIVE   Had Bm last night and this am. Not having significant abdominal pain just feels "tight as a drum". No nausea.    OBJECTIVE:    Scheduled inpatient medications:   acidophilus  2 capsule Oral Daily   Chlorhexidine Gluconate Cloth  6 each Topical Daily   enoxaparin (LOVENOX) injection  40 mg Subcutaneous Q24H   feeding supplement  1 Container Oral TID BM   feeding supplement (PROSource TF)  45 mL Per Tube TID   feeding supplement (VITAL 1.5 CAL)  1,000 mL Per Tube Q24H   insulin aspart  0-15 Units  Subcutaneous Q4H   mouth rinse  15 mL Mouth Rinse BID   multivitamin with minerals  1 tablet Oral Daily   pantoprazole  40 mg Oral Daily   potassium chloride  40 mEq Oral BID   Continuous inpatient infusions:   sodium chloride 10 mL/hr at 04/30/20 1835   sodium chloride 10 mL/hr at 05/06/20 1234   meropenem (MERREM) IV 1 g (05/07/20 0650)   methocarbamol (ROBAXIN) IV Stopped (05/05/20 0153)   PRN inpatient medications: HYDROmorphone (DILAUDID) injection, ketorolac, lidocaine, methocarbamol (ROBAXIN) IV, metoprolol tartrate, ondansetron, polyvinyl alcohol  Vital signs in last 24 hours: Temp:  [97.6 F (36.4 C)-98.5 F (36.9 C)] 97.6 F (36.4 C) (08/30 0805) Pulse Rate:  [80-94] 94 (08/30 0805) Resp:  [14-17] 14 (08/30 0805) BP: (102-125)/(48-84) 104/68 (08/30 0805) SpO2:  [96 %-100 %] 98 % (08/30 0805) Weight:  [86.6 kg] 86.6 kg (08/30 0500) Last BM Date: 05/06/20  Intake/Output Summary (Last 24 hours) at 05/07/2020 0835 Last data filed at 05/07/2020 0700 Gross per 24 hour  Intake 976.11 ml  Output 1125 ml  Net -148.89 ml     Physical Exam:   General: Alert, male in NAD  Heart:  Regular rate.   Pulmonary: Normal respiratory effort. Decrease breath sounds at both bases. On 02 per [place].   Abdomen: Soft, distended, hypoactive bowel sounds. Not significantly tenders.   Neurologic: Alert and oriented  Psych: Pleasant. Cooperative.    Intake/Output from previous day: 08/29 0701 -  08/30 0700 In: 976.1 [P.O.:600; I.V.:176.1; IV Piggyback:200] Out: 1125 [Urine:1040; Drains:85] Intake/Output this shift: No intake/output data recorded.  Lab Results: Recent Labs    05/05/20 0324 05/06/20 0146 05/07/20 0432  WBC 31.4* 31.1* 25.2*  HGB 13.3 12.3* 11.6*  HCT 40.4 37.5* 36.4*  PLT 237 279 326   BMET Recent Labs    05/05/20 0324 05/06/20 0146 05/07/20 0432  NA 135 134* 133*  K 3.0* 3.6 4.1  CL 100 103 103  CO2 24 20* 21*  GLUCOSE 194* 186* 216*  BUN 17  19 15   CREATININE 0.84 0.67 0.74  CALCIUM 7.5* 7.6* 7.6*   LFT Recent Labs    05/07/20 0432  PROT 4.7*  ALBUMIN 1.5*  AST 31  ALT 19  ALKPHOS 95  BILITOT 0.4   PT/INR No results for input(s): LABPROT, INR in the last 72 hours. Hepatitis Panel No results for input(s): HEPBSAG, HCVAB, HEPAIGM, HEPBIGM in the last 72 hours.  No results found.    Principal Problem:   Post-ERCP acute pancreatitis Active Problems:   Severe sepsis (HCC)   Polycythemia   AKI (acute kidney injury) ([place])   Metabolic acidosis   Acute respiratory failure with hypoxia (HCC)   Bilateral pleural effusion     LOS: 10 days   Tye Savoy ,NP 05/07/2020, 8:35 AM

## 2020-05-07 NOTE — Progress Notes (Signed)
Pharmacy Antibiotic Note  Adam Fowler is a 64 y.o. male completed 7-day Zosyn course on 8/26 for intra-abdominal coverage/ bile leak/ post-ERCP pancreatitis, now broadened to Meropenem on 8/27 in the setting of continued leukocytosis - pharmacy consulted to dose.  Renal function stable, WBC downtrending, no dose adjustments needed at this time.   Plan: - Continue Meropenem 1g IV every 8 hours - Will continue to follow renal function, culture results, LOT, and antibiotic de-escalation plans   Height: 5\' 9"  (175.3 cm) Weight: 86.6 kg (190 lb 14.7 oz) IBW/kg (Calculated) : 70.7  Temp (24hrs), Avg:98.2 F (36.8 C), Min:97.6 F (36.4 C), Max:98.5 F (36.9 C)  Recent Labs  Lab 05/03/20 0355 05/04/20 0351 05/05/20 0324 05/06/20 0146 05/07/20 0432  WBC 29.2* 32.3* 31.4* 31.1* 25.2*  CREATININE 0.86 0.76 0.84 0.67 0.74    Estimated Creatinine Clearance: 101.7 mL/min (by C-G formula based on SCr of 0.74 mg/dL).    No Known Allergies  Antimicrobials this admission: Zosyn 8/19>>8/26 Vancomycin 8/22>>8/23 (concern for pneumonia) Meropenem 8/27 >>  Dose adjustments this admission:  n/a  Microbiology results: 8/19 Blood x 2: negative 8/23 MRSA PCR: negative 8/19 COVID: negative  Thank you for allowing pharmacy to be a part of this patient's care.  Alycia Rossetti, PharmD, BCPS Clinical Pharmacist Clinical phone for 05/07/2020: [phone] 05/07/2020 11:15 AM   **Pharmacist phone directory can now be found on [email or web address] ([email or web address]).  Listed under [place].

## 2020-05-08 DIAGNOSIS — R410 Disorientation, unspecified: Secondary | ICD-10-CM

## 2020-05-08 LAB — GLUCOSE, CAPILLARY
Glucose-Capillary: 165 mg/dL — ABNORMAL HIGH (ref 70–99)
Glucose-Capillary: 171 mg/dL — ABNORMAL HIGH (ref 70–99)
Glucose-Capillary: 177 mg/dL — ABNORMAL HIGH (ref 70–99)
Glucose-Capillary: 182 mg/dL — ABNORMAL HIGH (ref 70–99)
Glucose-Capillary: 183 mg/dL — ABNORMAL HIGH (ref 70–99)
Glucose-Capillary: 184 mg/dL — ABNORMAL HIGH (ref 70–99)
Glucose-Capillary: 189 mg/dL — ABNORMAL HIGH (ref 70–99)

## 2020-05-08 LAB — CBC WITH DIFFERENTIAL/PLATELET
Abs Immature Granulocytes: 0.39 10*3/uL — ABNORMAL HIGH (ref 0.00–0.07)
Basophils Absolute: 0 10*3/uL (ref 0.0–0.1)
Basophils Relative: 0 %
Eosinophils Absolute: 0.2 10*3/uL (ref 0.0–0.5)
Eosinophils Relative: 1 %
HCT: 33.3 % — ABNORMAL LOW (ref 39.0–52.0)
Hemoglobin: 10.5 g/dL — ABNORMAL LOW (ref 13.0–17.0)
Immature Granulocytes: 2 %
Lymphocytes Relative: 7 %
Lymphs Abs: 1.3 10*3/uL (ref 0.7–4.0)
MCH: 28.2 pg (ref 26.0–34.0)
MCHC: 31.5 g/dL (ref 30.0–36.0)
MCV: 89.3 fL (ref 80.0–100.0)
Monocytes Absolute: 1.1 10*3/uL — ABNORMAL HIGH (ref 0.1–1.0)
Monocytes Relative: 6 %
Neutro Abs: 16.5 10*3/uL — ABNORMAL HIGH (ref 1.7–7.7)
Neutrophils Relative %: 84 %
Platelets: 366 10*3/uL (ref 150–400)
RBC: 3.73 MIL/uL — ABNORMAL LOW (ref 4.22–5.81)
RDW: 14.5 % (ref 11.5–15.5)
WBC: 19.5 10*3/uL — ABNORMAL HIGH (ref 4.0–10.5)
nRBC: 0 % (ref 0.0–0.2)

## 2020-05-08 LAB — BASIC METABOLIC PANEL
Anion gap: 9 (ref 5–15)
BUN: 15 mg/dL (ref 8–23)
CO2: 22 mmol/L (ref 22–32)
Calcium: 7.7 mg/dL — ABNORMAL LOW (ref 8.9–10.3)
Chloride: 103 mmol/L (ref 98–111)
Creatinine, Ser: 0.75 mg/dL (ref 0.61–1.24)
GFR calc Af Amer: >60 mL/min (ref >60)
GFR calc non Af Amer: >60 mL/min (ref >60)
Glucose, Bld: 188 mg/dL — ABNORMAL HIGH (ref 70–99)
Potassium: 3.9 mmol/L (ref 3.5–5.1)
Sodium: 134 mmol/L — ABNORMAL LOW (ref 135–145)

## 2020-05-08 LAB — MAGNESIUM: Magnesium: 2 mg/dL (ref 1.7–2.4)

## 2020-05-08 MED ORDER — ZOLPIDEM TARTRATE 5 MG PO TABS
5.0000 mg | ORAL_TABLET | Freq: Every evening | ORAL | Status: AC | PRN
  Administered 2020-05-09: 5 mg via ORAL
  Filled 2020-05-08: qty 1

## 2020-05-08 MED ORDER — ENSURE ENLIVE PO LIQD
237.0000 mL | Freq: Two times a day (BID) | ORAL | Status: DC
  Administered 2020-05-08 – 2020-05-10 (×4): 237 mL via ORAL

## 2020-05-08 NOTE — Progress Notes (Signed)
PROGRESS NOTE    Adam Fowler  NID:[id number] DOB: 1956-05-29 DOA: 04/26/2020 PCP: Melony Overly, MD    Brief Narrative:  Adam Fowler is a 64 year old male with past medical history notable for essential hypertension who underwent ERCP on 04/25/2020 with biliary stent exchange for persistent postoperative bile leak after recent cholecystectomy with abscess status post percutaneous drainage who presented to the ED with severe epigastric abdominal pain, nausea, vomiting since ERCP procedure.  He is unable to tolerate any p.o. intake and his urine is dark in color with significant abdominal distention.  In the ED he was noted to be afebrile, tachycardic.  WBC count 41.3, hemoglobin 19.3, bicarb 16, anion gap 17, glucose 191, BUN 36, creatinine 1.4 (baseline 0.7), high sensitive troponin negative x2.  Lactic acid 8.4>4.6, lipase 1379, AST 102, ALT 48, alk phos 69, T bili 3.9.  SARS-CoV-2 2 PCR negative.  CT abdomen/pelvis with findings consistent with acute pancreatitis, no drainable fluid collection or abscess.     Assessment & Plan:   Principal Problem:   Post-ERCP acute pancreatitis Active Problems:   Severe sepsis (HCC)   Polycythemia   AKI (acute kidney injury) ([place])   Metabolic acidosis   Acute respiratory failure with hypoxia (HCC)   Bilateral pleural effusion   Ileus (HCC)   Acute pancreatitis post ERCP Patient presenting following ERCP with nausea, vomiting, severe abdominal pain with associated distention.  Elevated lipase of 1300.  CT abdomen/pelvis with findings consistent with acute pancreatitis; no abscess or drainable fluid collection noted. --Lipase 1379>>>20 --Repeat CT abdomen/pelvis 05/02/2020 with markedly diffuse pancreatic inflammation with fat stranding and free fluid, no abscess or drainable fluid collection  --Blood cultures no growth x5 days --Treated with Zosyn.  Finished 7 days of therapy.  Changed to meropenem on 8/27.  End date to be determined by  gastroenterology. --Prolonged poor oral intake.  Initially on TPN. --Started on tube feeding through [facility] tube 8/27. --Daily monitoring of electrolytes and renal functions and aggressive replacement. --May need to consider TPN if has difficulty tolerating tube feeding. Currently on 30 mL/h of tube feedings.  Seems to be tolerating it well.  Gastroenterology continues to follow.  Also waiting on general surgery input regarding biliary drain.  Acute confusion Patient noted to be delirious this morning.  Having some hallucinations.  No focal deficits noted.  He received Ambien last night.  Also received Toradol this morning and Dilaudid last night.  Wife feels that is the Toradol that is causing him to have the symptoms.  Apparently has tolerated Ambien previously during this hospital stay.  Will discontinue Toradol per family request.  Monitor closely.  If he does not improve then may have to do imaging studies.  No history of falls.  Acute ileus Patient continues with abdominal distention due to pancreatitis.  KUB 05/01/2020 shows normal gas pattern.  Seems to have improved.  Currently on tube feedings.  Acute hypoxic respiratory failure secondary to volume overload During hospitalization, patient developed significant hypoxia likely secondary to volume overload from aggressive fluid resuscitation to treat his underlying pancreatitis.  Patient was successfully extubated on 05/01/2020. --Continue supple oxygen, maintain SPO2 greater than 92%, currently on 4 L on mobility.  Room air at rest. --BiPAP as needed --Was on Lasix trial.  Severe hypokalemia.  Lasix was subsequently discontinued.  --Incentive spirometry --Strict I's and O's and daily weights --Mobilize with PT/OT Respiratory status seems to be stable.  Acute renal failure Creatinine 1.4 on admission with a baseline of 0.6.  Improved with IV fluid hydration and antibiotics.   Monitor urine output.  Labs have been stable the last few  days.  New onset proximal A. Fib During hospitalization, patient developed new onset atrial fibrillation.  Converted to sinus rhythm.  CHA2DS2-VASc score = 1.  Was initially started on amiodarone --Continue aspirin 81 mg p.o. daily --Monitor on telemetry  Hypokalemia Potassium has been stable the last 2 days.  Magnesium is 2.0.  Weakness/debility:  --PT/OT following, currently recommending SNF  Leukocytosis --no fevers at this time --blood cultures from 8/19 with no growth.  --possibly reactive to underlying pancreatitis --WBC continues to improve.  Normocytic anemia No evidence of overt bleeding.  Continue to monitor.   DVT prophylaxis: Lovenox Code Status: Full code Family Communication: No family at bedside today.  Discussed with the patient. Disposition Plan: Mobilize.  PT is following.  Hopefully return home when improved.  Status is: Inpatient  Remains inpatient appropriate because:IV treatments appropriate due to intensity of illness or inability to take PO and Inpatient level of care appropriate due to severity of illness   Dispo:  Patient From: Home  Planned Disposition: [place]  Expected discharge date: 05/11/20  Medically stable for discharge: No      Consultants:   PCCM - signed off 05/02/2020  [facility]  Procedures:   Intubation 04/29/2020, extubation 05/01/2020  Antimicrobials:   Zosyn 8/19-8/26  Meropenem, 8/27---   Subjective: Patient noted to be confused this morning.  Was able to tell me where he was and was able to tell me the year and the month.  Moving all his extremities.    Objective: Vitals:   05/07/20 1947 05/08/20 0256 05/08/20 0357 05/08/20 0728  BP: 130/75 111/72  111/78  Pulse: 96 97  98  Resp: 20 20  14   Temp: 98.3 F (36.8 C) 98.7 F (37.1 C)  98.2 F (36.8 C)  TempSrc: Oral Oral  Oral  SpO2: 94% 96%  100%  Weight:   86.9 kg   Height:        Intake/Output Summary (Last 24 hours) at 05/08/2020  1146 Last data filed at 05/08/2020 0700 Gross per 24 hour  Intake 683.07 ml  Output 320 ml  Net 363.07 ml   Filed Weights   05/04/20 0500 05/07/20 0500 05/08/20 0357  Weight: 82.9 kg 86.6 kg 86.9 kg    Examination:  General appearance: Awake alert.  In no distress.  Distracted Resp: Normal effort at rest.  Coarse breath sounds bilaterally with few crackles in the bases.  No wheezing or rhonchi  Cardio: S1-S2 is normal regular.  No S3-S4.  No rubs murmurs or bruit GI: Abdomen is soft.  Mildly distended.  Nontender.  Sluggish bowel sounds but present. No masses organomegaly.  Biliary drain noted. Extremities: No edema.  Full range of motion of lower extremities. Neurologic: Noted to be slightly delirious but able to answer orientation questions.  No obvious focal deficits noted.    Data Reviewed: I have personally reviewed following labs and imaging studies  CBC: Recent Labs  Lab 05/04/20 0351 05/05/20 0324 05/06/20 0146 05/07/20 0432 05/08/20 0245  WBC 32.3* 31.4* 31.1* 25.2* 19.5*  NEUTROABS 28.6* 27.8* 27.9* 22.2* 16.5*  HGB 13.2 13.3 12.3* 11.6* 10.5*  HCT 39.5 40.4 37.5* 36.4* 33.3*  MCV 87.4 89.6 88.9 90.5 89.3  PLT 219 237 279 326 696   Basic Metabolic Panel: Recent Labs  Lab 05/02/20 0509 05/02/20 0509 05/03/20 0355 05/03/20 0355 05/03/20 1602 05/04/20 0351 05/05/20  5809 05/06/20 0146 05/07/20 0432 05/08/20 0245  NA 144   < > 139   < >  --  136 135 134* 133* 134*  K 3.1*   < > 2.3*   < >  --  2.4* 3.0* 3.6 4.1 3.9  CL 105   < > 102   < >  --  100 100 103 103 103  CO2 23   < > 22   < >  --  24 24 20* 21* 22  GLUCOSE 165*   < > 164*   < >  --  131* 194* 186* 216* 188*  BUN 30*   < > 24*   < >  --  17 17 19 15 15   CREATININE 1.10   < > 0.86   < >  --  0.76 0.84 0.67 0.74 0.75  CALCIUM 8.2*   < > 7.8*   < >  --  7.6* 7.5* 7.6* 7.6* 7.7*  MG 2.1  --  1.8  --   --  2.1 2.1  --   --  2.0  PHOS 3.3  --  2.4*  --  3.2  --  3.4  --   --   --    < > = values  in this interval not displayed.   GFR: Estimated Creatinine Clearance: 101.9 mL/min (by C-G formula based on SCr of 0.75 mg/dL). Liver Function Tests: Recent Labs  Lab 05/02/20 0509 05/03/20 0355 05/05/20 0324 05/06/20 0146 05/07/20 0432  AST 44* 43* 32 31 31  ALT 24 25 22 20 19   ALKPHOS 87 101 94 102 95  BILITOT 1.6* 1.3* 0.8 0.6 0.4  PROT 5.6* 5.5* 4.9* 4.8* 4.7*  ALBUMIN 2.1* 2.0* 1.7* 1.6* 1.5*   Recent Labs  Lab 05/02/20 0509 05/03/20 0355  LIPASE 18 20   CBG: Recent Labs  Lab 05/07/20 1602 05/07/20 1947 05/08/20 0019 05/08/20 0334 05/08/20 0753  GLUCAP 175* 199* 183* 171* 189*     Recent Results (from the past 240 hour(s))  MRSA PCR Screening     Status: None   Collection Time: 04/30/20 12:10 AM   Specimen: Nasal Mucosa; Nasopharyngeal  Result Value Ref Range Status   MRSA by PCR NEGATIVE NEGATIVE Final    Comment:        The GeneXpert MRSA Assay (FDA approved for NASAL specimens only), is one component of a comprehensive MRSA colonization surveillance program. It is not intended to diagnose MRSA infection nor to guide or monitor treatment for MRSA infections. Performed at [facility], [place] [address] [place], [place] [address]          Radiology Studies: No results found.      Scheduled Meds: . acidophilus  2 capsule Oral Daily  . Chlorhexidine Gluconate Cloth  6 each Topical Daily  . enoxaparin (LOVENOX) injection  40 mg Subcutaneous Q24H  . feeding supplement  1 Container Oral TID BM  . feeding supplement (PROSource TF)  45 mL Per Tube TID  . feeding supplement (VITAL 1.5 CAL)  1,000 mL Per Tube Q24H  . insulin aspart  0-15 Units Subcutaneous Q4H  . mouth rinse  15 mL Mouth Rinse BID  . multivitamin with minerals  1 tablet Oral Daily  . pantoprazole  40 mg Oral Daily  . potassium chloride  40 mEq Oral BID  . sodium chloride flush  10-40 mL Intracatheter Q12H   Continuous Infusions: . sodium chloride 10 mL/hr at  04/30/20 1835  .  sodium chloride Stopped (05/07/20 0923)  . meropenem (MERREM) IV 1 g (05/08/20 1022)  . methocarbamol (ROBAXIN) IV Stopped (05/05/20 0153)     LOS: 11 days    Bonnielee Haff 05/08/2020

## 2020-05-08 NOTE — Progress Notes (Signed)
Physical Therapy Treatment Patient Details Name: Adam Fowler MRN: [id number] DOB: 10/26/1955 Today's Date: 05/08/2020    History of Present Illness Pt is a 64 yo male who presents to the ED with severe abdominal pain, nausea and vomiting. Found to have post ERCP acute pancreatits. Pt developed Hypoxic resp failure and was Intubated 8/22  and extubated 8/24. Pt also with new onset a fib and acute ileus. PMH includes HTN and ERCP.     PT Comments    Pt able to increase activity but with frequent rest breaks.  He demonstrated impulsivity requiring frequent cues for safe transfer techniques and to allow therapist to assist.  Pt able to ambulate 10'x3 then 40' but with increased RR, DOE of 4/4, on 4 LPM O2 with sats 91%.  Cont POC.    Follow Up Recommendations  SNF     Equipment Recommendations  Rolling walker with 5" wheels;3in1 (PT)    Recommendations for Other Services       Precautions / Restrictions Precautions Precautions: Fall;Other (comment) Precaution Comments: cortrak    Mobility  Bed Mobility Overal bed mobility: Needs Assistance Bed Mobility: Supine to Sit     Supine to sit: Supervision        Transfers Overall transfer level: Needs assistance Equipment used: Rolling walker (2 wheeled) Transfers: Stand Pivot Transfers Sit to Stand: Min guard         General transfer comment: Repeated cues for safe hand placment and controlled descent.  Sit to stand x 8 throughout session.  Ambulation/Gait Ambulation/Gait assistance: Min assist Gait Distance (Feet): 40 Feet (10'x3 then 40') Assistive device: Rolling walker (2 wheeled) Gait Pattern/deviations: Step-through pattern;Staggering left;Staggering right;Decreased stride length Gait velocity: decreased   General Gait Details: Pt unsteady with multiple LOB requiring at least min A to recover.  Also, min A for walker.   Stairs             Wheelchair Mobility    Modified Rankin (Stroke Patients Only)        Balance Overall balance assessment: Needs assistance Sitting-balance support: Feet supported;No upper extremity supported Sitting balance-Leahy Scale: Good     Standing balance support: Bilateral upper extremity supported;Single extremity supported;During functional activity Standing balance-Leahy Scale: Poor Standing balance comment: Required at least 1 UE support during toileting ADLs                            Cognition Arousal/Alertness: Awake/alert Behavior During Therapy: Impulsive Overall Cognitive Status: Impaired/Different from baseline Area of Impairment: Problem solving;Safety/judgement                       Following Commands: Follows one step commands with increased time Safety/Judgement: Decreased awareness of safety;Decreased awareness of deficits   Problem Solving: Difficulty sequencing;Slow processing;Requires verbal cues;Requires tactile cues General Comments: Required specific instructions and cues for sequencing.  Pt impulsive and with decreased awareness of deficits         Exercises      General Comments General comments (skin integrity, edema, etc.): Pt on 4 LPM O2.  Sats remained 91% or greater.  HR ranged from 90-115 bpm.  Pt with DOE of 4/4 with activity with increased RR requiring frequent rest breaks.   Pt used toilet for BM and to urinate during therapy.  Therapist stood outside of bathroom door, but heard pt starting to get up so went into bathroom.  Pt starting to get tangled in lines,  did not have hand on walker, and was trying to get to shower seat unsafely.  Pt cued to let therapist assist.       Pertinent Vitals/Pain Pain Assessment: Faces Faces Pain Scale: Hurts little more Pain Location: abdomen Pain Descriptors / Indicators: Grimacing;Guarding Pain Intervention(s): Limited activity within patient's tolerance;Monitored during session;Premedicated before session;Relaxation;Repositioned    Home Living                       Prior Function            PT Goals (current goals can now be found in the care plan section) Acute Rehab PT Goals Patient Stated Goal: TO GO HOME PT Goal Formulation: With patient Time For Goal Achievement: 05/16/20 Potential to Achieve Goals: Good Progress towards PT goals: Progressing toward goals    Frequency    Min 2X/week      PT Plan Current plan remains appropriate    Co-evaluation              AM-PAC PT "6 Clicks" Mobility   Outcome Measure  Help needed turning from your back to your side while in a flat bed without using bedrails?: None Help needed moving from lying on your back to sitting on the side of a flat bed without using bedrails?: None Help needed moving to and from a bed to a chair (including a wheelchair)?: A Little Help needed standing up from a chair using your arms (e.g., wheelchair or bedside chair)?: A Little Help needed to walk in hospital room?: A Little Help needed climbing 3-5 steps with a railing? : A Lot 6 Click Score: 19    End of Session Equipment Utilized During Treatment: Oxygen;Gait belt Activity Tolerance: Patient tolerated treatment well Patient left: in chair;with call bell/phone within reach;with chair alarm set Nurse Communication: Mobility status PT Visit Diagnosis: Unsteadiness on feet (R26.81);Muscle weakness (generalized) (M62.81);Difficulty in walking, not elsewhere classified (R26.2)     Time: 5366-4403 PT Time Calculation (min) (ACUTE ONLY): 40 min  Charges:  $Gait Training: 23-37 mins $Therapeutic Activity: 8-22 mins                     Abran Richard, PT Acute Rehab Services Pager [phone] [facility] 05/08/2020, 5:44 PM

## 2020-05-08 NOTE — Progress Notes (Signed)
Occupational Therapy Treatment Patient Details Name: Adam Fowler MRN: [id number] DOB: February 09, 1956 Today's Date: 05/08/2020    History of present illness Pt is a 64 yo male who presents to the ED with severe abdominal pain, nausea and vomiting. Found to have post ERCP acute pancreatits. Pt developed Hypoxic resp failure and was Intubated 8/22  and extubated 8/24. Pt also with new onset a fib and acute ileus. PMH includes HTN and ERCP.    OT comments  PT. LETHARGIC AND REQUESTING TO GO BACK TO BED. PT. WIFE STATES IT IS THE MEDICINE HE WAS GIVEN. PT. PERFORMED ADLS IN CHAIR PRIOR TO RETURNING TO BED. PT. REQUIRED CUES FOR PROPER HAND PLACEMENT AND TO SEQUENCE WITH WALKER. PT. TO BE FOLLOWED FOR ACUTE NEEDS.   Follow Up Recommendations  SNF;Supervision/Assistance - 24 hour    Equipment Recommendations  3 in 1 bedside commode    Recommendations for Other Services      Precautions / Restrictions Precautions Precautions: Fall;Other (comment) Precaution Comments: NGT Restrictions Weight Bearing Restrictions: No       Mobility Bed Mobility           Sit to supine: Min assist      Transfers     Transfers: Stand Pivot Transfers;Sit to/from Stand Sit to Stand: Min guard Stand pivot transfers: Min assist       General transfer comment: PT. REQUIRES CUES FOR HAND PLACEMENT    Balance     Sitting balance-Leahy Scale: Good       Standing balance-Leahy Scale: Fair                             ADL either performed or assessed with clinical judgement   ADL                       Lower Body Dressing: Minimal assistance;Sit to/from stand   Toilet Transfer: Minimal assistance;Ambulation;RW   Toileting- Clothing Manipulation and Hygiene: Minimal assistance;Sit to/from stand       Functional mobility during ADLs: Minimal assistance;Rolling walker       Vision       Perception     Praxis      Cognition Arousal/Alertness: Suspect due to  medications;Lethargic Behavior During Therapy: Flat affect Overall Cognitive Status: Impaired/Different from baseline                         Following Commands: Follows one step commands with increased time Safety/Judgement: Decreased awareness of safety;Decreased awareness of deficits   Problem Solving: Slow processing;Difficulty sequencing;Decreased initiation;Requires verbal cues General Comments: PT. WIFE STATES THEY MEDS ARE MAKING HIM SLEEPY.        Exercises     Shoulder Instructions       General Comments      Pertinent Vitals/ Pain       Pain Assessment: No/denies pain  Home Living                                          Prior Functioning/Environment              Frequency  Min 2X/week        Progress Toward Goals  OT Goals(current goals can now be found in the care plan section)  Progress towards OT goals: Progressing toward goals  Acute Rehab OT Goals Patient Stated Goal: TO GO HOME OT Goal Formulation: With patient Time For Goal Achievement: 05/17/20 Potential to Achieve Goals: Good ADL Goals Pt Will Perform Grooming: with supervision;standing Pt Will Perform Lower Body Bathing: with supervision;sit to/from stand Pt Will Perform Lower Body Dressing: with supervision;sit to/from stand Pt Will Transfer to Toilet: with supervision;ambulating;bedside commode Pt Will Perform Toileting - Clothing Manipulation and hygiene: with supervision;sit to/from stand Additional ADL Goal #1: Pt will perform bed mobility modified independently.  Plan Discharge plan remains appropriate    Co-evaluation                 AM-PAC OT "6 Clicks" Daily Activity     Outcome Measure   Help from another person eating meals?: None Help from another person taking care of personal grooming?: A Little Help from another person toileting, which includes using toliet, bedpan, or urinal?: A Little Help from another person bathing  (including washing, rinsing, drying)?: A Little Help from another person to put on and taking off regular upper body clothing?: None Help from another person to put on and taking off regular lower body clothing?: A Little 6 Click Score: 20    End of Session Equipment Utilized During Treatment: Gait belt;Rolling walker  OT Visit Diagnosis: Unsteadiness on feet (R26.81);Other abnormalities of gait and mobility (R26.89);Muscle weakness (generalized) (M62.81);Other symptoms and signs involving cognitive function   Activity Tolerance Patient limited by lethargy   Patient Left in bed;with call bell/phone within reach;with bed alarm set;with family/visitor present   Nurse Communication  (OK THERAPY)        Time: 1914-7829 OT Time Calculation (min): 23 min  Charges: OT General Charges $OT Visit: 1 Visit OT Treatments $Self Care/Home Management : 8-22 mins  Reece Packer OT/L  Adam Fowler 05/08/2020, 1:40 PM

## 2020-05-08 NOTE — Progress Notes (Signed)
      Progress Note   Subjective  Patient states he is feeling okay today. Had BM yesterday and this AM. Appears to be tolerating tube feeds and took some PO yesterday. WBC improving.    Objective   Vital signs in last 24 hours: Temp:  [98.2 F (36.8 C)-98.7 F (37.1 C)] 98.2 F (36.8 C) (08/31 0728) Pulse Rate:  [96-99] 98 (08/31 0728) Resp:  [14-20] 14 (08/31 0728) BP: (111-130)/(72-78) 111/78 (08/31 0728) SpO2:  [94 %-100 %] 100 % (08/31 0728) Weight:  [86.9 kg] 86.9 kg (08/31 0357) Last BM Date: 05/07/20 General:    white male in NAD Heart:  Regular rate and rhythm; Lungs: Respirations even and unlabored Abdomen:  Soft, distended Neurologic:  Alert and oriented,  grossly normal neurologically. Psych:  Cooperative. Normal mood and affect.  Intake/Output from previous day: 08/30 0701 - 08/31 0700 In: 683.1 [I.V.:183.1; IV Piggyback:500] Out: 320 [Urine:200; Drains:120] Intake/Output this shift: No intake/output data recorded.  Lab Results: Recent Labs    05/06/20 0146 05/07/20 0432 05/08/20 0245  WBC 31.1* 25.2* 19.5*  HGB 12.3* 11.6* 10.5*  HCT 37.5* 36.4* 33.3*  PLT 279 326 366   BMET Recent Labs    05/06/20 0146 05/07/20 0432 05/08/20 0245  NA 134* 133* 134*  K 3.6 4.1 3.9  CL 103 103 103  CO2 20* 21* 22  GLUCOSE 186* 216* 188*  BUN 19 15 15   CREATININE 0.67 0.74 0.75  CALCIUM 7.6* 7.6* 7.7*   LFT Recent Labs    05/07/20 0432  PROT 4.7*  ALBUMIN 1.5*  AST 31  ALT 19  ALKPHOS 95  BILITOT 0.4   PT/INR No results for input(s): LABPROT, INR in the last 72 hours.  Studies/Results: No results found.     Assessment / Plan:    64 y/o male with history of bile leak s/p ERCP with stent replacement 8/18, hospitalized with post ERCP pancreatitis.   Overall seems to be slowly improving. WBC continues to downtrend on meropenem (started 8/27). Hgb has drifted slightly but no reported blood in stools or GI track loss per nursing. LFTs normal.  He does have some abdominal distension but having bowel movements and tolerating some PO (Boost), seems comfortable. Tried some fruit cocktail yesterday. Tolerating tube feeds at 30cc / hr. I think okay for trial of escalation to higher tube feed rate today.  Continue supportive care at this time. Awaiting surgical input in regards to timing of drain pull. Encouraged him to continue to progress oral intake as he tolerates. Call with questions. Will follow  Adam Cellar, MD [facility]

## 2020-05-08 NOTE — Progress Notes (Signed)
Nutrition Follow-up  RD working remotely.  DOCUMENTATION CODES:   Not applicable  INTERVENTION:   -D/c Boost Breeze po TID, each supplement provides 250 kcal and 9 grams of protein -Ensure Enlive po BID, each supplement provides 350 kcal and 20 grams of protein -Continue Magic cup TID with meals, each supplement provides 290 kcal and 9 grams of protein -Continue Vital 1.5 @ 30 ml/hr via post-pyloric cortrak tube and increase by 10 ml every 8 hours to goal rate of 60 ml/hr.   45 ml Prosource TF TID.    Tube feeding regimen provides2280kcal (98% of needs),130grams of protein, and 1188ml of H2O.  NUTRITION DIAGNOSIS:   Inadequate oral intake related to acute illness as evidenced by NPO status.  Ongoing  GOAL:   Patient will meet greater than or equal to 90% of their needs  Progressing   MONITOR:   PO intake, Supplement acceptance, Labs, Weight trends, TF tolerance, I & O's  REASON FOR ASSESSMENT:   Ventilator    ASSESSMENT:   64 year old male with PMH of HTN, ERCP on 8/18 s/p biliary stent exchange for persistent postoperative bile leak after recent cholecystectomy with abscess s/- percutaneous drainage. CT abdomen/pelvis showing acute pancreatitis. Pt admitted with severe sepsis secondary to post-ERCP pancreatitis.  8/21 - rapid response due to atrial fibrillation and WOB 8/22 - intubated 8/24- extubated 8/25- advanced to clear liquid diet 8/26- advanced to full liquid diet 8/27- advanced to carb modified diet, cortrak tube placed (gastric); tube advanced under fluroscopy 8/29- downgraded to full liquid diet 8/29- TF stopped due to complaints of abdominal pain; TF re-started at rate of 30 ml/hr  Reviewed I/O's: +363 ml x 24 hours and -5.8 L since admission  UOP: 200 ml x 24 hours   Drain output: 120 ml x 24 hours  Attempted to speak with pt via call to hospital room phone, however, no answer.   Pt with minimal oral intake; noted meal completion 25-50%.  Pt is refusing Boost Breeze supplements.   Pt receiving TF of Vital 1.5 vita post-pyloric cortrak tube at 30 ml/hr (with 45 ml Prosource TF), which is currently providing 1200 kcals, 82 grams protein, and 550 ml free water daily, meeting 52% of estimated kcal needs and 64% of estimated protein needs. Per GI notes, may trial advancing TF today.   Labs reviewed: Na: 134, CBGS: 171-189 (inpatient orders for glycemic control are 0-15 units insulin aspart every 4 hours).   Diet Order:   Diet Order            Diet full liquid Room service appropriate? Yes; Fluid consistency: Thin  Diet effective now                 EDUCATION NEEDS:   Education needs have been addressed  Skin:  Skin Assessment: Skin Integrity Issues: Skin Integrity Issues:: Incisions Incisions: closed abdomen  Last BM:  05/07/20  Height:   Ht Readings from Last 1 Encounters:  05/02/20 5\' 9"  (1.753 m)    Weight:   Wt Readings from Last 1 Encounters:  05/08/20 86.9 kg    Ideal Body Weight:  72.7 kg  BMI:  Body mass index is 28.29 kg/m.  Estimated Nutritional Needs:   Kcal:  0737-1062  Protein:  130-145 grams  Fluid:  > 2.3 L    Loistine Chance, RD, LDN, [place] Registered Dietitian II Certified Diabetes Care and Education Specialist Please refer to [facility] for RD and/or RD on-call/weekend/after hours pager

## 2020-05-09 LAB — CBC WITH DIFFERENTIAL/PLATELET
Abs Immature Granulocytes: 0.39 10*3/uL — ABNORMAL HIGH (ref 0.00–0.07)
Basophils Absolute: 0.1 10*3/uL (ref 0.0–0.1)
Basophils Relative: 0 %
Eosinophils Absolute: 0.2 10*3/uL (ref 0.0–0.5)
Eosinophils Relative: 1 %
HCT: 37.3 % — ABNORMAL LOW (ref 39.0–52.0)
Hemoglobin: 11.7 g/dL — ABNORMAL LOW (ref 13.0–17.0)
Immature Granulocytes: 2 %
Lymphocytes Relative: 7 %
Lymphs Abs: 1.4 10*3/uL (ref 0.7–4.0)
MCH: 28.5 pg (ref 26.0–34.0)
MCHC: 31.4 g/dL (ref 30.0–36.0)
MCV: 91 fL (ref 80.0–100.0)
Monocytes Absolute: 1.3 10*3/uL — ABNORMAL HIGH (ref 0.1–1.0)
Monocytes Relative: 7 %
Neutro Abs: 15.8 10*3/uL — ABNORMAL HIGH (ref 1.7–7.7)
Neutrophils Relative %: 83 %
Platelets: 479 10*3/uL — ABNORMAL HIGH (ref 150–400)
RBC: 4.1 MIL/uL — ABNORMAL LOW (ref 4.22–5.81)
RDW: 14.4 % (ref 11.5–15.5)
WBC: 19.1 10*3/uL — ABNORMAL HIGH (ref 4.0–10.5)
nRBC: 0 % (ref 0.0–0.2)

## 2020-05-09 LAB — BASIC METABOLIC PANEL
Anion gap: 8 (ref 5–15)
BUN: 11 mg/dL (ref 8–23)
CO2: 24 mmol/L (ref 22–32)
Calcium: 8 mg/dL — ABNORMAL LOW (ref 8.9–10.3)
Chloride: 103 mmol/L (ref 98–111)
Creatinine, Ser: 0.71 mg/dL (ref 0.61–1.24)
GFR calc Af Amer: >60 mL/min (ref >60)
GFR calc non Af Amer: >60 mL/min (ref >60)
Glucose, Bld: 183 mg/dL — ABNORMAL HIGH (ref 70–99)
Potassium: 4.6 mmol/L (ref 3.5–5.1)
Sodium: 135 mmol/L (ref 135–145)

## 2020-05-09 LAB — GLUCOSE, CAPILLARY
Glucose-Capillary: 187 mg/dL — ABNORMAL HIGH (ref 70–99)
Glucose-Capillary: 185 mg/dL — ABNORMAL HIGH (ref 70–99)
Glucose-Capillary: 199 mg/dL — ABNORMAL HIGH (ref 70–99)
Glucose-Capillary: 184 mg/dL — ABNORMAL HIGH (ref 70–99)
Glucose-Capillary: 187 mg/dL — ABNORMAL HIGH (ref 70–99)

## 2020-05-09 MED ORDER — MELATONIN 3 MG PO TABS
6.0000 mg | ORAL_TABLET | Freq: Every evening | ORAL | Status: DC | PRN
  Administered 2020-05-10: 6 mg via ORAL
  Filled 2020-05-09: qty 2

## 2020-05-09 MED ORDER — IBUPROFEN 200 MG PO TABS
400.0000 mg | ORAL_TABLET | Freq: Three times a day (TID) | ORAL | Status: DC
  Administered 2020-05-09 – 2020-05-16 (×21): 400 mg via ORAL
  Filled 2020-05-09 (×21): qty 2

## 2020-05-09 MED ORDER — ACETAMINOPHEN 325 MG PO TABS
650.0000 mg | ORAL_TABLET | Freq: Four times a day (QID) | ORAL | Status: DC
  Administered 2020-05-09 – 2020-05-31 (×76): 650 mg via ORAL
  Filled 2020-05-09 (×77): qty 2

## 2020-05-09 MED ORDER — IPRATROPIUM-ALBUTEROL 0.5-2.5 (3) MG/3ML IN SOLN
3.0000 mL | Freq: Four times a day (QID) | RESPIRATORY_TRACT | Status: DC | PRN
  Administered 2020-05-09 – 2020-05-30 (×7): 3 mL via RESPIRATORY_TRACT
  Filled 2020-05-09 (×7): qty 3

## 2020-05-09 NOTE — Progress Notes (Signed)
Referring Physician(s): Vena Rua (GI)  Supervising Physician: Aletta Edouard  Patient Status:  [facility] - In-pt  Chief Complaint: "Back pain"  Subjective:  History of gangrenous cholecystitis s/p laparoscopic cholecystectomy in OR 01/24/2020 by Dr. Bobbye Morton; complicated by development of gallbladder fossa abscess s/p gallbladder drain placement in IR 02/13/2020 by Dr. 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 Patient awake and alert sitting in bed. Complains of back pain, stable since admission. Gallbladder fossa drain site c/d/i.   Allergies: Patient has no known allergies.  Medications: Prior to Admission medications   Medication Sig Start Date End Date Taking? Authorizing Provider  Calcium Carb-Cholecalciferol (CALCIUM+D3 PO) Take 1 tablet by mouth daily.   Yes [provider]  Coenzyme Q10 (COQ10) 400 MG CAPS Take 800 mg by mouth in the morning.    Yes [provider]  Cyanocobalamin (VITAMIN B-12) 5000 MCG TBDP Take 5,000 mcg by mouth daily.   Yes [provider]  GLUCOSAMINE-CHONDROITIN PO Take 1 tablet by mouth in the morning and at bedtime.    Yes [provider]  Javier Docker Oil 1000 MG CAPS Take 1,000 mg by mouth in the morning and at bedtime.    Yes [provider]  lisinopril-hydrochlorothiazide (ZESTORETIC) 10-12.5 MG tablet Take 1 tablet by mouth daily. 03/04/20  Yes [provider]  Moringa Oleifera (MORINGA PO) Take 1,000 mg by mouth 2 (two) times daily.   Yes [provider]  Multiple Vitamin (MULTIVITAMIN WITH MINERALS) TABS tablet Take 1 tablet by mouth daily.   Yes [provider]  ondansetron (ZOFRAN) 4 MG tablet Take 1 tablet (4 mg total) by mouth every 8 (eight) hours as needed for nausea or vomiting. 04/25/20  Yes Mansouraty, Telford Nab., MD   OVER THE COUNTER MEDICATION Take 1 tablet by mouth See admin instructions. [facility] otc tablet- Take 1 tablet by mouth once a day   Yes [provider]  oxyCODONE (OXY IR/ROXICODONE) 5 MG immediate release tablet Take 5 mg by mouth 2 (two) times daily as needed (for pain).  04/25/20  Yes [provider]  pravastatin (PRAVACHOL) 80 MG tablet Take 40 mg by mouth in the morning and at bedtime.   Yes [provider]  Red Yeast Rice 600 MG CAPS Take 1,200 mg by mouth at bedtime.    Yes [provider]  sertraline (ZOLOFT) 100 MG tablet Take 100 mg by mouth daily. 11/08/19  Yes [provider]  TURMERIC PO Take 1,500 mg by mouth 2 (two) times daily.   Yes [provider]  zolpidem (AMBIEN) 5 MG tablet Take 5 mg by mouth at bedtime. 12/29/19  Yes [provider]  aspirin EC 81 MG tablet Take 81 mg by mouth 2 (two) times daily. Patient not taking: Reported on 04/26/2020    [provider]  Calcium Carb-Cholecalciferol (CALCIUM 1000 + D PO) Take 1 tablet by mouth daily. Patient not taking: Reported on 04/26/2020    [provider]  ibuprofen (ADVIL) 200 MG tablet Take 400-600 mg by mouth every 6 (six) hours as needed for headache or moderate pain.    [provider]     Vital Signs: BP 132/86 (BP Location: Right Arm)   Pulse 95   Temp 98.3 F (36.8 C) (Oral)   Resp 16   Ht 5\' 9"  (1.753 m)   Wt 191 lb 9.3 oz (  86.9 kg)   SpO2 95%   BMI 28.29 kg/m   Physical Exam Vitals and nursing note reviewed.  Constitutional:      General: He is not in acute distress. Pulmonary:     Effort: Pulmonary effort is normal. No respiratory distress.  Abdominal:     Comments: Gallbladder fossa drain site without tenderness, drainage, or active bleeding; approximately 75 cc dark bile in suction bulb; drain flushes without resistance- suction bulb was switched to gravity following flush with return of output into gravity bag.    Skin:    General: Skin is warm and dry.  Neurological:     Mental Status: He is alert and oriented to person, place, and time.     Imaging: No results found.  Labs:  CBC: Recent Labs    05/06/20 0146 05/07/20 0432 05/08/20 0245 05/09/20 0303  WBC 31.1* 25.2* 19.5* 19.1*  HGB 12.3* 11.6* 10.5* 11.7*  HCT 37.5* 36.4* 33.3* 37.3*  PLT 279 326 366 479*    COAGS: Recent Labs    02/02/20 1902 02/02/20 2115  INR 1.2 1.1    BMP: Recent Labs    05/06/20 0146 05/07/20 0432 05/08/20 0245 05/09/20 0303  NA 134* 133* 134* 135  K 3.6 4.1 3.9 4.6  CL 103 103 103 103  CO2 20* 21* 22 24  GLUCOSE 186* 216* 188* 183*  BUN 19 15 15 11   CALCIUM 7.6* 7.6* 7.7* 8.0*  CREATININE 0.67 0.74 0.75 0.71  GFRNONAA >60 >60 >60 >60  GFRAA >60 >60 >60 >60    LIVER FUNCTION TESTS: Recent Labs    05/03/20 0355 05/05/20 0324 05/06/20 0146 05/07/20 0432  BILITOT 1.3* 0.8 0.6 0.4  AST 43* 32 31 31  ALT 25 22 20 19   ALKPHOS 101 94 102 95  PROT 5.5* 4.9* 4.8* 4.7*  ALBUMIN 2.0* 1.7* 1.6* 1.5*    Assessment and Plan:  History of gangrenous cholecystitis s/p laparoscopic cholecystectomy in OR 01/24/2020 by Dr. Bobbye Morton; complicated by development of gallbladder fossa abscess s/p gallbladder drain placement in IR 02/13/2020 by Dr. 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 Gallbladder fossa drain stable with approximately 75 cc dark bile in suction bulb. Discussed case with Dr. Kathlene Cote who recommends switching drain from suction to gravity and to continue current drain management- do NOT flush drainage catheter at this time, please record output Qshift, no drain injection recommended at this time as drain has high output. Further plans per TRH/CCS/GI- appreciate and agree with management. IR to follow.   Electronically  Signed: Earley Abide, PA-C 05/09/2020, 10:59 AM   I spent a total of 25 Minutes at the the patient's bedside AND on the patient's hospital floor or unit, greater than 50% of which was counseling/coordinating care for gallbladder fossa abscess s/p gallbladder fossa drain placement.

## 2020-05-09 NOTE — Progress Notes (Addendum)
Daily Rounding Note  05/09/2020, 8:17 AM  LOS: 12 days   SUBJECTIVE:   Chief complaint: pancreatitis.  Biloma, bile leak     Not much abd pain.  Low back pain persists.  2 loose BM's yesterday.  None today Walking in hall w waker and assist but gets very tired  OBJECTIVE:         Vital signs in last 24 hours:    Temp:  [98 F (36.7 C)-98.9 F (37.2 C)] 98.3 F (36.8 C) (09/01 0754) Pulse Rate:  [91-95] 95 (09/01 0754) Resp:  [15-17] 16 (09/01 0754) BP: (119-132)/(76-86) 132/86 (09/01 0754) SpO2:  [92 %-95 %] 95 % (09/01 0754) Last BM Date: 05/08/20 Filed Weights   05/04/20 0500 05/07/20 0500 05/08/20 0357  Weight: 82.9 kg 86.6 kg 86.9 kg   General: looks better   Heart: RRR Chest: clear Abdomen: ~ 75 mL in JP drain, watery/brown, non-purulent material in drain.  NT.  ND.  Soft, active BS.   Extremities: no CCE Neuro/Psych:  More engaged.  Affect less flat.    Intake/Output from previous day: 08/31 0701 - 09/01 0700 In: -  Out: 320 [Urine:300; Drains:20]  Intake/Output this shift: No intake/output data recorded.  Lab Results: Recent Labs    05/07/20 0432 05/08/20 0245 05/09/20 0303  WBC 25.2* 19.5* 19.1*  HGB 11.6* 10.5* 11.7*  HCT 36.4* 33.3* 37.3*  PLT 326 366 479*   BMET Recent Labs    05/07/20 0432 05/08/20 0245 05/09/20 0303  NA 133* 134* 135  K 4.1 3.9 4.6  CL 103 103 103  CO2 21* 22 24  GLUCOSE 216* 188* 183*  BUN 15 15 11   CREATININE 0.74 0.75 0.71  CALCIUM 7.6* 7.7* 8.0*   LFT Recent Labs    05/07/20 0432  PROT 4.7*  ALBUMIN 1.5*  AST 31  ALT 19  ALKPHOS 95  BILITOT 0.4   Scheduled Meds: . acidophilus  2 capsule Oral Daily  . Chlorhexidine Gluconate Cloth  6 each Topical Daily  . enoxaparin (LOVENOX) injection  40 mg Subcutaneous Q24H  . feeding supplement (ENSURE ENLIVE)  237 mL Oral BID BM  . feeding supplement (PROSource TF)  45 mL Per Tube TID  . feeding  supplement (VITAL 1.5 CAL)  1,000 mL Per Tube Q24H  . insulin aspart  0-15 Units Subcutaneous Q4H  . mouth rinse  15 mL Mouth Rinse BID  . multivitamin with minerals  1 tablet Oral Daily  . pantoprazole  40 mg Oral Daily  . potassium chloride  40 mEq Oral BID  . sodium chloride flush  10-40 mL Intracatheter Q12H   Continuous Infusions: . sodium chloride 10 mL/hr at 04/30/20 1835  . sodium chloride Stopped (05/07/20 0923)  . meropenem (MERREM) IV 1 g (05/09/20 0121)  . methocarbamol (ROBAXIN) IV Stopped (05/05/20 0153)   PRN Meds:.HYDROmorphone (DILAUDID) injection, lidocaine, methocarbamol (ROBAXIN) IV, metoprolol tartrate, ondansetron, polyvinyl alcohol, sodium chloride flush   ASSESMENT:   *  Post ERCP pancreatitis Bile duct stent for post op bile leak, replaced 8/18.  JP drain in place, output  JP drain to abscess/biloma placed on 6/7.  Drainage of 20 to120 ml over previous 5 days. WBCs continue improving trend.  No fevers.  Day 7 Meropenem, previous zosyn. Latest CT 8/26.  TF and FL diet in place.    *   Elevated blood sugars.   SSI in place.    *   Debilitation,  fall risk.  Working w PT and OT, ambulating w walker and assists.  Marland Kitchen     PLAN   *  Ask IR to reeval the biloma/abscess drain.   *  Advance to carb mod diet.  See if he tolerates.  Pt would like some fruit.     Azucena Freed  05/09/2020, 8:17 AM Phone [phone]    Attending Physician Note   I have taken an interval history, reviewed the chart and examined the patient. I agree with the Advanced Practitioner's note, impression and recommendations.   Pancreatitis is steadily improving. Advancing diet today.  Bile leak S/P biliary stent.  Biloma. Check with IR regarding timing of drain removal.   Lucio Edward, MD [facility]

## 2020-05-09 NOTE — Plan of Care (Signed)

## 2020-05-09 NOTE — Progress Notes (Signed)
PROGRESS NOTE    Adam Fowler  NUU:[id number] DOB: Jul 12, 1956 DOA: 04/26/2020 PCP: Melony Overly, MD    Brief Narrative:  Patient admitted to the hospital working diagnosis of post ERCP acute pancreatitis.  65 year old male with past medical history significant for hypertension. He underwent ERCP on 8/18 and biliary stent exchange for persistent postoperative bile leak, related to recent cholecystectomy with abscess status post percutaneous drainage.  Patient developed severe epigastric abdominal pain, associated with nausea, and vomiting after ERCP procedure.  On his initial physical examination blood pressure 137/88, heart rate 114, respiratory 23, oxygen saturation 92%, his lungs were clear to auscultation bilaterally, heart S1-S2, present, tachycardic, his abdomen was tender to palpation, he was distended, no rebound or guarding, no lower extremity edema.  CT of the abdomen pelvis show changes consistent with acute pancreatitis, no abscess or drainable fluid collection.  Patient was admitted to the medical ward, he received supportive medical therapy including intravenous fluids, analgesics and IV antibiotics.  Repeat CT of the abdomen pelvis 8/25 markedly diffuse pancreatic inflammation with fat stranding and free fluid.  His antibiotic therapy was switched from Zosyn to meropenem on 8/27.  He has been placed on tube feeds via NG tube.   His hospitalization has been complicated by metabolic encephalopathy, acute hypoxic respiratory failure, renal failure, new onset paroxysmal atrial fibrillation and acute ileus.  Assessment & Plan:   Principal Problem:   Post-ERCP acute pancreatitis Active Problems:   Severe sepsis (HCC)   Polycythemia   AKI (acute kidney injury) ([place])   Metabolic acidosis   Acute respiratory failure with hypoxia (HCC)   Bilateral pleural effusion   Ileus (HCC)   1. Acute post ERCP pancreatitis/ ileus. Patient continue to have back pain, positive nausea but  not vomiting, has been tolerating tube feedings. AST 31. ALT 19, alb is 1.5, total bil is 0,4. Wbc is down to 25 from 31.   Continue pain control with hydromorphone as needed, will add scheduled acetaminophen.  Antibiotic therapy with meropenem. Will check abdominal film in am to evaluate gas pattern.   2. Acute hypoxic respiratory failure Oxygenation has improved, today is 96% on room air.  Continue close monitoring of volume status.   3. AKI with hypokalemia. Renal function with serum cr at 0,74 with K at 4,1 and serum bicarbonate at 21. Will continue close monitoring of renal function and electrolytes.   4. Paroxysmal atrial fibrillation. HR has been controlled. Continue telemetry monitoring. Continue metoprolol.  6. Anemia.   Stable hgb at 11,6 with hct at 36,4. Will continue close monitoring of cell count.   7. Acute metabolic encephalopathy. Continue neuro checks per unit protocol,. Patient today with no confusion or agitation.   Patient continue to be at high risk for worsening pancreatitis.   Status is: Inpatient  Remains inpatient appropriate because:IV treatments appropriate due to intensity of illness or inability to take PO   Dispo:  Patient From: Home  Planned Disposition: [place]  Expected discharge date: 05/11/20  Medically stable for discharge: No   DVT prophylaxis: Enoxaparin   Code Status:   full  Family Communication:  No family at the bedside      Nutrition Status: Nutrition Problem: Inadequate oral intake Etiology: acute illness Signs/Symptoms: NPO status Interventions: Boost Breeze, MVI      Antimicrobials:   Meropenem.     Subjective: Patient continue to have significant back pain, worse with movement, mild nausea but not vomiting. No chest pain or dyspnea.  Objective: Vitals:   05/08/20 1944 05/09/20 0300 05/09/20 0754 05/09/20 1510  BP: 122/76 128/78 132/86 120/78  Pulse: 94 91 95 (!) 102  Resp: 17 15 16 18   Temp:  98.3 F (36.8 C) 98.9 F (37.2 C) 98.3 F (36.8 C) 98.2 F (36.8 C)  TempSrc: Oral Oral Oral Oral  SpO2: 92% 93% 95% 96%  Weight:      Height:        Intake/Output Summary (Last 24 hours) at 05/09/2020 1614 Last data filed at 05/09/2020 0500 Gross per 24 hour  Intake --  Output 320 ml  Net -320 ml   Filed Weights   05/04/20 0500 05/07/20 0500 05/08/20 0357  Weight: 82.9 kg 86.6 kg 86.9 kg    Examination:   General: in pain, deconditioned and ill looking appearing Neurology: Awake and alert, non focal  E ENT: mild pallor, no icterus, oral mucosa moist Cardiovascular: No JVD. S1-S2 present, rhythmic, no gallops, rubs, or murmurs. +/++ pitting bilateral lower extremity edema. Pulmonary:  positive breath sounds bilaterally, adequate air movement, no wheezing, rhonchi or rales. Gastrointestinal. Abdomen distended but not tender Skin. No rashes Musculoskeletal: no joint deformities     Data Reviewed: I have personally reviewed following labs and imaging studies  CBC: Recent Labs  Lab 05/05/20 0324 05/06/20 0146 05/07/20 0432 05/08/20 0245 05/09/20 0303  WBC 31.4* 31.1* 25.2* 19.5* 19.1*  NEUTROABS 27.8* 27.9* 22.2* 16.5* 15.8*  HGB 13.3 12.3* 11.6* 10.5* 11.7*  HCT 40.4 37.5* 36.4* 33.3* 37.3*  MCV 89.6 88.9 90.5 89.3 91.0  PLT 237 279 326 366 975*   Basic Metabolic Panel: Recent Labs  Lab 05/03/20 0355 05/03/20 0355 05/03/20 1602 05/04/20 0351 05/04/20 0351 05/05/20 0324 05/06/20 0146 05/07/20 0432 05/08/20 0245 05/09/20 0303  NA 139   < >  --  136   < > 135 134* 133* 134* 135  K 2.3*   < >  --  2.4*   < > 3.0* 3.6 4.1 3.9 4.6  CL 102   < >  --  100   < > 100 103 103 103 103  CO2 22   < >  --  24   < > 24 20* 21* 22 24  GLUCOSE 164*   < >  --  131*   < > 194* 186* 216* 188* 183*  BUN 24*   < >  --  17   < > 17 19 15 15 11   CREATININE 0.86   < >  --  0.76   < > 0.84 0.67 0.74 0.75 0.71  CALCIUM 7.8*   < >  --  7.6*   < > 7.5* 7.6* 7.6* 7.7* 8.0*  MG  1.8  --   --  2.1  --  2.1  --   --  2.0  --   PHOS 2.4*  --  3.2  --   --  3.4  --   --   --   --    < > = values in this interval not displayed.   GFR: Estimated Creatinine Clearance: 101.9 mL/min (by C-G formula based on SCr of 0.71 mg/dL). Liver Function Tests: Recent Labs  Lab 05/03/20 0355 05/05/20 0324 05/06/20 0146 05/07/20 0432  AST 43* 32 31 31  ALT 25 22 20 19   ALKPHOS 101 94 102 95  BILITOT 1.3* 0.8 0.6 0.4  PROT 5.5* 4.9* 4.8* 4.7*  ALBUMIN 2.0* 1.7* 1.6* 1.5*   Recent Labs  Lab 05/03/20 0355  LIPASE 20   No results for input(s): AMMONIA in the last 168 hours. Coagulation Profile: No results for input(s): INR, PROTIME in the last 168 hours. Cardiac Enzymes: No results for input(s): CKTOTAL, CKMB, CKMBINDEX, TROPONINI in the last 168 hours. BNP (last 3 results) No results for input(s): PROBNP in the last 8760 hours. HbA1C: No results for input(s): HGBA1C in the last 72 hours. CBG: Recent Labs  Lab 05/08/20 1658 05/08/20 1949 05/08/20 2333 05/09/20 0342 05/09/20 0804  GLUCAP 165* 182* 184* 187* 185*   Lipid Profile: No results for input(s): CHOL, HDL, LDLCALC, TRIG, CHOLHDL, LDLDIRECT in the last 72 hours. Thyroid Function Tests: No results for input(s): TSH, T4TOTAL, FREET4, T3FREE, THYROIDAB in the last 72 hours. Anemia Panel: No results for input(s): VITAMINB12, FOLATE, FERRITIN, TIBC, IRON, RETICCTPCT in the last 72 hours.    Radiology Studies: I have reviewed all of the imaging during this hospital visit personally     Scheduled Meds: . acidophilus  2 capsule Oral Daily  . Chlorhexidine Gluconate Cloth  6 each Topical Daily  . enoxaparin (LOVENOX) injection  40 mg Subcutaneous Q24H  . feeding supplement (ENSURE ENLIVE)  237 mL Oral BID BM  . feeding supplement (PROSource TF)  45 mL Per Tube TID  . feeding supplement (VITAL 1.5 CAL)  1,000 mL Per Tube Q24H  . insulin aspart  0-15 Units Subcutaneous Q4H  . mouth rinse  15 mL Mouth Rinse  BID  . multivitamin with minerals  1 tablet Oral Daily  . pantoprazole  40 mg Oral Daily  . potassium chloride  40 mEq Oral BID  . sodium chloride flush  10-40 mL Intracatheter Q12H   Continuous Infusions: . sodium chloride 10 mL/hr at 04/30/20 1835  . sodium chloride Stopped (05/07/20 0923)  . meropenem (MERREM) IV 1 g (05/09/20 0912)  . methocarbamol (ROBAXIN) IV 500 mg (05/09/20 1039)     LOS: 12 days        Winfred Redel Gerome Apley, MD

## 2020-05-10 ENCOUNTER — Inpatient Hospital Stay (HOSPITAL_COMMUNITY): Payer: 59

## 2020-05-10 ENCOUNTER — Other Ambulatory Visit: Payer: 59

## 2020-05-10 DIAGNOSIS — K668 Other specified disorders of peritoneum: Secondary | ICD-10-CM

## 2020-05-10 LAB — CBC WITH DIFFERENTIAL/PLATELET
Abs Immature Granulocytes: 0.46 10*3/uL — ABNORMAL HIGH (ref 0.00–0.07)
Basophils Absolute: 0.1 10*3/uL (ref 0.0–0.1)
Basophils Relative: 1 %
Eosinophils Absolute: 0.3 10*3/uL (ref 0.0–0.5)
Eosinophils Relative: 2 %
HCT: 36.9 % — ABNORMAL LOW (ref 39.0–52.0)
Hemoglobin: 11.6 g/dL — ABNORMAL LOW (ref 13.0–17.0)
Immature Granulocytes: 3 %
Lymphocytes Relative: 9 %
Lymphs Abs: 1.6 10*3/uL (ref 0.7–4.0)
MCH: 28.6 pg (ref 26.0–34.0)
MCHC: 31.4 g/dL (ref 30.0–36.0)
MCV: 91.1 fL (ref 80.0–100.0)
Monocytes Absolute: 1.5 10*3/uL — ABNORMAL HIGH (ref 0.1–1.0)
Monocytes Relative: 8 %
Neutro Abs: 14.4 10*3/uL — ABNORMAL HIGH (ref 1.7–7.7)
Neutrophils Relative %: 77 %
Platelets: 483 10*3/uL — ABNORMAL HIGH (ref 150–400)
RBC: 4.05 MIL/uL — ABNORMAL LOW (ref 4.22–5.81)
RDW: 14.6 % (ref 11.5–15.5)
WBC: 18.3 10*3/uL — ABNORMAL HIGH (ref 4.0–10.5)
nRBC: 0 % (ref 0.0–0.2)

## 2020-05-10 LAB — BASIC METABOLIC PANEL
Anion gap: 7 (ref 5–15)
BUN: 13 mg/dL (ref 8–23)
CO2: 23 mmol/L (ref 22–32)
Calcium: 8.1 mg/dL — ABNORMAL LOW (ref 8.9–10.3)
Chloride: 102 mmol/L (ref 98–111)
Creatinine, Ser: 0.73 mg/dL (ref 0.61–1.24)
GFR calc Af Amer: >60 mL/min (ref >60)
GFR calc non Af Amer: >60 mL/min (ref >60)
Glucose, Bld: 186 mg/dL — ABNORMAL HIGH (ref 70–99)
Potassium: 4.6 mmol/L (ref 3.5–5.1)
Sodium: 132 mmol/L — ABNORMAL LOW (ref 135–145)

## 2020-05-10 LAB — GLUCOSE, CAPILLARY
Glucose-Capillary: 194 mg/dL — ABNORMAL HIGH (ref 70–99)
Glucose-Capillary: 180 mg/dL — ABNORMAL HIGH (ref 70–99)
Glucose-Capillary: 166 mg/dL — ABNORMAL HIGH (ref 70–99)
Glucose-Capillary: 178 mg/dL — ABNORMAL HIGH (ref 70–99)

## 2020-05-10 MED ORDER — OXYCODONE HCL 5 MG PO TABS
5.0000 mg | ORAL_TABLET | Freq: Two times a day (BID) | ORAL | Status: DC | PRN
  Administered 2020-05-10 – 2020-05-14 (×8): 5 mg via ORAL
  Filled 2020-05-10 (×9): qty 1

## 2020-05-10 MED ORDER — ADULT MULTIVITAMIN W/MINERALS CH
1.0000 | ORAL_TABLET | Freq: Every day | ORAL | Status: DC
  Administered 2020-05-11 – 2020-05-18 (×7): 1 via ORAL
  Filled 2020-05-10 (×7): qty 1

## 2020-05-10 MED ORDER — SERTRALINE HCL 50 MG PO TABS
100.0000 mg | ORAL_TABLET | Freq: Every day | ORAL | Status: DC
  Administered 2020-05-10 – 2020-06-01 (×21): 100 mg via ORAL
  Filled 2020-05-10 (×8): qty 1
  Filled 2020-05-10: qty 2
  Filled 2020-05-10 (×12): qty 1

## 2020-05-10 MED ORDER — ZOLPIDEM TARTRATE 5 MG PO TABS
5.0000 mg | ORAL_TABLET | Freq: Every day | ORAL | Status: DC
  Administered 2020-05-10 – 2020-05-14 (×5): 5 mg via ORAL
  Filled 2020-05-10 (×5): qty 1

## 2020-05-10 MED ORDER — ENSURE ENLIVE PO LIQD
237.0000 mL | Freq: Two times a day (BID) | ORAL | Status: DC
  Administered 2020-05-11 – 2020-05-16 (×8): 237 mL via ORAL

## 2020-05-10 NOTE — Progress Notes (Addendum)
Daily Rounding Note  05/10/2020, 8:31 AM  LOS: 13 days   SUBJECTIVE:   Chief complaint:  Pancreatitis.  Bile leak.  Biloma/abscess.      Perc bile drain output: variable, 120, 20, 320 mL in previous 3 days.   PO intake fair.   2 BM's yesterday.  Intermittent pain in RLQ. Limited mobility w assist and walker.  Still gets worn out and increased RR w walking linger lenghts (4o feet yesterday). His goal is to get home by tmrw evening!  OBJECTIVE:         Vital signs in last 24 hours:    Temp:  [97.9 F (36.6 C)-98.9 F (37.2 C)] 98.1 F (36.7 C) (09/02 0823) Pulse Rate:  [90-102] 93 (09/02 0823) Resp:  [17-20] 19 (09/02 0823) BP: (110-120)/(67-81) 113/81 (09/02 0823) SpO2:  [95 %-100 %] 95 % (09/02 0823) Last BM Date: 05/08/20 Filed Weights   05/04/20 0500 05/07/20 0500 05/08/20 0357  Weight: 82.9 kg 86.6 kg 86.9 kg   General: still looks exhausted and ill but better overall   Heart: RRR Chest: diminished at bases, clear.  Labored breathing when speaking despite [place] oxygen.  No cough Abdomen: soft, NT.  Brown, clear bilious looking fluuid in perc drain bag  Extremities: slight LE edema w/o pitting Neuro/Psych:  Oriented x 3.  Speech choppy due to dyspnea  Intake/Output from previous day: 09/01 0701 - 09/02 0700 In: -  Out: 920 [Urine:600; Drains:320]  Intake/Output this shift: No intake/output data recorded.  Lab Results: Recent Labs    05/08/20 0245 05/09/20 0303 05/10/20 0426  WBC 19.5* 19.1* 18.3*  HGB 10.5* 11.7* 11.6*  HCT 33.3* 37.3* 36.9*  PLT 366 479* 483*   BMET Recent Labs    05/08/20 0245 05/09/20 0303 05/10/20 0426  NA 134* 135 132*  K 3.9 4.6 4.6  CL 103 103 102  CO2 22 24 23   GLUCOSE 188* 183* 186*  BUN 15 11 13   CREATININE 0.75 0.71 0.73  CALCIUM 7.7* 8.0* 8.1*   LFT No results for input(s): PROT, ALBUMIN, AST, ALT, ALKPHOS, BILITOT, BILIDIR, IBILI in the last 72  hours. PT/INR No results for input(s): LABPROT, INR in the last 72 hours. Hepatitis Panel No results for input(s): HEPBSAG, HCVAB, HEPAIGM, HEPBIGM in the last 72 hours.  Studies/Results: DG Abd 1 View  Result Date: 05/10/2020 CLINICAL DATA:  Abdominal distention.  Biliary stent. EXAM: ABDOMEN - 1 VIEW COMPARISON:  05/04/2020. FINDINGS: Feeding tube tip now noted at the level of the distal stomach/duodenal bulb. Right upper quadrant drainage catheter and biliary stent stable position. No bowel distention. No acute bony abnormality. IMPRESSION: Feeding tube tip now noted the level of the distal stomach/duodenal bulb. Right upper quadrant drainage catheter and biliary stent in stable position. No bowel distention. Electronically Signed   By: Marcello Moores  Register   On: 05/10/2020 06:34    ASSESMENT:   *  Post ERCP pancreatitis 6/7 perc drain, 7/14 Bile duct stent for post op bile leak.  Stent replaced 8/18.  JP drain in place, output  JP drain to abscess/biloma placed on 6/7.  Drainage of 20 to120 ml over previous 5 days. WBCs remain elevated at 18.3 but continue improving trend.  No fevers.  Day 8 Meropenem, previous zosyn. Latest CT 8/26.   *   Protein cal malnutrition.  Tube feeds and FL diet in place.    *   Elevated blood sugars.   SSI in  place.    *   Hyponatremia.    *   Debilitation, fall risk.  Working w PT and OT, ambulating w walker and assists.     PLAN   *  Per IR perc drain to remain in place to gravity drainage, no flushing Per Dr Rush Landmark, next ERCP would be 6 to 8 weeks (last week September to mid October)  *  ? At some point should we switch to nocturnal TFs to see if this allows for return of appetite and increased daytime po intake?  This would be transition before stopping TFs.    *   Leave Meropenem in place.       Azucena Freed  05/10/2020, 8:31 AM Phone [phone]   Attending physician's note   I have taken an interval history, reviewed the chart and  examined the patient. I agree with the Advanced Practitioner's note, impression and recommendations.    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 Bile leak s/p ERCP with stent 8/18, plan for repeat ERCP tentatively in 6-8 weeks  Continue antibiotics and supportive care   I have spent >15 minutes of patient care (this includes precharting, chart review, review of results, face-to-face time used for counseling as well as treatment plan and follow-up. The patient was provided an opportunity to ask questions and all were answered. The patient agreed with the plan and demonstrated an understanding of the instructions.  Damaris Hippo , MD [phone]

## 2020-05-10 NOTE — Progress Notes (Signed)
Patient moved by 2 RNs to room 5N22.  Wife, Shankar Silber, called to notify of new room number and phone number.  Patient and family have no questions or concerns at this time.

## 2020-05-10 NOTE — Progress Notes (Signed)
Referring Physician(s): Azucena Freed, PA-C  Supervising Physician: Sandi Mariscal  Patient Status:  [facility] - In-pt  Chief Complaint: History of gangrenous cholecystitis s/p laparoscopic cholecystectomy in OR 01/24/2020 by Dr. Bobbye Morton; complicated by development of gallbladder fossa abscess and is now s/p gallbladder drain placement in IR 02/13/2020 by Dr. Annamaria Boots. 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  Subjective: Patient sitting up in chair, eyes closed but he is appropriately responsive to my questions. He endorses some pain/discomfort in his RUQ.   Allergies: Patient has no known allergies.  Medications: Prior to Admission medications   Medication Sig Start Date End Date Taking? Authorizing Provider  Calcium Carb-Cholecalciferol (CALCIUM+D3 PO) Take 1 tablet by mouth daily.   Yes [provider]  Coenzyme Q10 (COQ10) 400 MG CAPS Take 800 mg by mouth in the morning.    Yes [provider]  Cyanocobalamin (VITAMIN B-12) 5000 MCG TBDP Take 5,000 mcg by mouth daily.   Yes [provider]  GLUCOSAMINE-CHONDROITIN PO Take 1 tablet by mouth in the morning and at bedtime.    Yes [provider]  Javier Docker Oil 1000 MG CAPS Take 1,000 mg by mouth in the morning and at bedtime.    Yes [provider]  lisinopril-hydrochlorothiazide (ZESTORETIC) 10-12.5 MG tablet Take 1 tablet by mouth daily. 03/04/20  Yes [provider]  Moringa Oleifera (MORINGA PO) Take 1,000 mg by mouth 2 (two) times daily.   Yes [provider]  Multiple Vitamin (MULTIVITAMIN WITH MINERALS) TABS tablet Take 1 tablet by mouth daily.   Yes [provider]  ondansetron (ZOFRAN) 4 MG tablet Take 1 tablet (4 mg total) by mouth every 8 (eight) hours as needed for nausea or vomiting. 04/25/20  Yes Mansouraty, Telford Nab., MD   OVER THE COUNTER MEDICATION Take 1 tablet by mouth See admin instructions. [facility] otc tablet- Take 1 tablet by mouth once a day   Yes [provider]  oxyCODONE (OXY IR/ROXICODONE) 5 MG immediate release tablet Take 5 mg by mouth 2 (two) times daily as needed (for pain).  04/25/20  Yes [provider]  pravastatin (PRAVACHOL) 80 MG tablet Take 40 mg by mouth in the morning and at bedtime.   Yes [provider]  Red Yeast Rice 600 MG CAPS Take 1,200 mg by mouth at bedtime.    Yes [provider]  sertraline (ZOLOFT) 100 MG tablet Take 100 mg by mouth daily. 11/08/19  Yes [provider]  TURMERIC PO Take 1,500 mg by mouth 2 (two) times daily.   Yes [provider]  zolpidem (AMBIEN) 5 MG tablet Take 5 mg by mouth at bedtime. 12/29/19  Yes [provider]  aspirin EC 81 MG tablet Take 81 mg by mouth 2 (two) times daily. Patient not taking: Reported on 04/26/2020    [provider]  Calcium Carb-Cholecalciferol (CALCIUM 1000 + D PO) Take 1 tablet by mouth daily. Patient not taking: Reported on 04/26/2020    [provider]  ibuprofen (ADVIL) 200 MG tablet Take 400-600 mg by mouth every 6 (six) hours as needed for headache or moderate pain.    [provider]     Vital Signs: BP 113/81 (BP Location: Left Arm)    Pulse 93    Temp 98.1 F (36.7 C) (Oral)    Resp 19    Ht 5\' 9"  (1.753 m)  Wt 191 lb 9.3 oz (86.9 kg)    SpO2 95%    BMI 28.29 kg/m   Physical Exam Constitutional:      General: He is not in acute distress.    Comments: Eyes closed throughout assessment but he answered all questions appropriately.   HENT:     Head:     Comments: Cor-trak in place; tube feeds infusing.  Pulmonary:     Effort: Pulmonary effort is normal.  Abdominal:     Palpations: Abdomen is soft.     Tenderness: There is abdominal tenderness in the right upper quadrant.     Comments: RUQ drain in place. Split gauze  dressing with some old drainage. Approximately 80 cc bile in gravity bag. Skin insertion site with mild erythema. No drainage around site.   Skin:    General: Skin is warm and dry.  Neurological:     Mental Status: He is oriented to person, place, and time.     Comments: Mild disorientation/forgetfulness.      Imaging: DG Abd 1 View  Result Date: 05/10/2020 CLINICAL DATA:  Abdominal distention.  Biliary stent. EXAM: ABDOMEN - 1 VIEW COMPARISON:  05/04/2020. FINDINGS: Feeding tube tip now noted at the level of the distal stomach/duodenal bulb. Right upper quadrant drainage catheter and biliary stent stable position. No bowel distention. No acute bony abnormality. IMPRESSION: Feeding tube tip now noted the level of the distal stomach/duodenal bulb. Right upper quadrant drainage catheter and biliary stent in stable position. No bowel distention. Electronically Signed   By: Marcello Moores  Register   On: 05/10/2020 06:34    Labs:  CBC: Recent Labs    05/07/20 0432 05/08/20 0245 05/09/20 0303 05/10/20 0426  WBC 25.2* 19.5* 19.1* 18.3*  HGB 11.6* 10.5* 11.7* 11.6*  HCT 36.4* 33.3* 37.3* 36.9*  PLT 326 366 479* 483*    COAGS: Recent Labs    02/02/20 1902 02/02/20 2115  INR 1.2 1.1    BMP: Recent Labs    05/07/20 0432 05/08/20 0245 05/09/20 0303 05/10/20 0426  NA 133* 134* 135 132*  K 4.1 3.9 4.6 4.6  CL 103 103 103 102  CO2 21* 22 24 23   GLUCOSE 216* 188* 183* 186*  BUN 15 15 11 13   CALCIUM 7.6* 7.7* 8.0* 8.1*  CREATININE 0.74 0.75 0.71 0.73  GFRNONAA >60 >60 >60 >60  GFRAA >60 >60 >60 >60    LIVER FUNCTION TESTS: Recent Labs    05/03/20 0355 05/05/20 0324 05/06/20 0146 05/07/20 0432  BILITOT 1.3* 0.8 0.6 0.4  AST 43* 32 31 31  ALT 25 22 20 19   ALKPHOS 101 94 102 95  PROT 5.5* 4.9* 4.8* 4.7*  ALBUMIN 2.0* 1.7* 1.6* 1.5*    Assessment and Plan:  Gallbladder fossa abscess s/p gallbladder drain placement in IR 02/13/20; 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  Approximately 80 cc dark bile in gravity bag. 320 cc output documented in Epic. He is afebrile, WBC slightly down compared to yesterday. Continue current drain management per orders: record output every shift, change dressing daily or as needed. Do not flush drainage catheter at this time.   Other plans per primary teams. IR will continue to follow.   Electronically Signed: Soyla Dryer, AGACNP-BC [phone] 05/10/2020, 9:06 AM   I spent a total of 15 Minutes at the the patient's bedside AND on the patient's hospital floor or unit, greater than 50% of which was counseling/coordinating care  for gallbladder fossa abscess s/p gallbladder fossa drain placement.

## 2020-05-10 NOTE — Plan of Care (Signed)
  Problem: Clinical Measurements: Goal: Will remain free from infection Outcome: Progressing   Problem: Nutrition: Goal: Adequate nutrition will be maintained Outcome: Progressing   Problem: Safety: Goal: Ability to remain free from injury will improve Outcome: Progressing   

## 2020-05-10 NOTE — Evaluation (Signed)
Occupational Therapy Evaluation Patient Details Name: Adam Fowler MRN: [id number] DOB: [birth year] Today's Date: 05/10/2020    History of Present Illness Pt is a 64 yo male who presents to the ED with severe abdominal pain, nausea and vomiting. Found to have post ERCP acute pancreatits. Pt developed Hypoxic resp failure and was Intubated 8/22  and extubated 8/24. Pt also with new onset a fib and acute ileus. PMH includes HTN and ERCP.    Clinical Impression   Pt with decreased activity tolerance and cognition remains significantly impaired. Pt needing multiple verbal cues for safety and problem solving with toileting and walker use. Continues to be appropriate for SNF level rehab.     Follow Up Recommendations  SNF;Supervision/Assistance - 24 hour    Equipment Recommendations  3 in 1 bedside commode    Recommendations for Other Services       Precautions / Restrictions Precautions Precautions: Fall Precaution Comments: cortrak, abdominal drain      Mobility Bed Mobility Overal bed mobility: Needs Assistance Bed Mobility: Supine to Sit     Supine to sit: Min assist     General bed mobility comments: pulled up on therapist's hand  Transfers Overall transfer level: Needs assistance Equipment used: Rolling walker (2 wheeled) Transfers: Sit to/from Stand Sit to Stand: Min guard         General transfer comment: does not follow hand placement instructions despite multiple cues    Balance Overall balance assessment: Needs assistance Sitting-balance support: Feet supported;No upper extremity supported Sitting balance-Leahy Scale: Good Sitting balance - Comments: no LOB with donning socks     Standing balance-Leahy Scale: Poor                             ADL either performed or assessed with clinical judgement   ADL Overall ADL's : Needs assistance/impaired     Grooming: Wash/dry hands;Standing;Min guard       Lower Body Bathing: Minimal  assistance;Sitting/lateral leans Lower Body Bathing Details (indicate cue type and reason): assist for thoroughness     Lower Body Dressing: Sitting/lateral leans;Supervision/safety Lower Body Dressing Details (indicate cue type and reason): to don and doff socks Toilet Transfer: Min guard;Ambulation;RW;BSC (over toilet)     Toileting - Clothing Manipulation Details (indicate cue type and reason): pt with impaired problem solving in order to aim urine in toilet from sitting position     Functional mobility during ADLs: Min guard;Rolling walker       Vision         Perception     Praxis      Pertinent Vitals/Pain Pain Assessment: Faces Faces Pain Scale: Hurts little more Pain Location: abdomen Pain Descriptors / Indicators: Grimacing;Guarding Pain Intervention(s): Monitored during session;Repositioned     Hand Dominance     Extremity/Trunk Assessment             Communication     Cognition Arousal/Alertness: Awake/alert Behavior During Therapy: Impulsive Overall Cognitive Status: Impaired/Different from baseline Area of Impairment: Attention;Memory;Following commands;Safety/judgement;Awareness;Problem solving                   Current Attention Level: Sustained Memory: Decreased short-term memory Following Commands: Follows one step commands with increased time Safety/Judgement: Decreased awareness of safety;Decreased awareness of deficits Awareness: Emergent Problem Solving: Difficulty sequencing;Slow processing;Requires verbal cues;Requires tactile cues     General Comments       Exercises     Shoulder Instructions  Home Living                                          Prior Functioning/Environment                   OT Problem List:        OT Treatment/Interventions:      OT Goals(Current goals can be found in the care plan section) Acute Rehab OT Goals Patient Stated Goal: to walk OT Goal Formulation:  With patient Time For Goal Achievement: 05/17/20 Potential to Achieve Goals: Good  OT Frequency: Min 2X/week   Barriers to D/C:            Co-evaluation              AM-PAC OT "6 Clicks" Daily Activity     Outcome Measure Help from another person eating meals?: None Help from another person taking care of personal grooming?: A Little Help from another person toileting, which includes using toliet, bedpan, or urinal?: A Little Help from another person bathing (including washing, rinsing, drying)?: A Little Help from another person to put on and taking off regular upper body clothing?: A Little Help from another person to put on and taking off regular lower body clothing?: A Little 6 Click Score: 19   End of Session Equipment Utilized During Treatment: Gait belt;Rolling walker  Activity Tolerance: Patient tolerated treatment well Patient left: in chair;with call bell/phone within reach;with chair alarm set  OT Visit Diagnosis: Unsteadiness on feet (R26.81);Other abnormalities of gait and mobility (R26.89);Muscle weakness (generalized) (M62.81);Other symptoms and signs involving cognitive function                Time: 1102-1117 OT Time Calculation (min): 37 min Charges:  OT General Charges $OT Visit: 1 Visit OT Treatments $Self Care/Home Management : 23-37 mins  Nestor Lewandowsky, OTR/L Acute Rehabilitation Services Pager: [phone] Office: [phone] Malka So 05/10/2020, 3:23 PM

## 2020-05-10 NOTE — Progress Notes (Signed)
Pt asked for Ambien for sleep. On call paged and spoke to over the phone. On call stated that she has been giving pt one time orders of Ambien for a couple of nights now and it should be addressed during daytime with provider. Writer will pass along message to day nurse

## 2020-05-10 NOTE — Plan of Care (Signed)
°  Problem: Education: Goal: Knowledge of General Education information will improve Description: Including pain rating scale, medication(s)/side effects and non-pharmacologic comfort measures Outcome: Progressing   Problem: Health Behavior/Discharge Planning: Goal: Ability to manage health-related needs will improve Outcome: Progressing   Problem: Clinical Measurements: Goal: Will remain free from infection Outcome: Progressing   Problem: Nutrition: Goal: Adequate nutrition will be maintained Outcome: Progressing   Problem: Coping: Goal: Level of anxiety will decrease Outcome: Progressing   Problem: Elimination: Goal: Will not experience complications related to bowel motility Outcome: Progressing Goal: Will not experience complications related to urinary retention Outcome: Progressing   Problem: Pain Managment: Goal: General experience of comfort will improve Outcome: Progressing

## 2020-05-10 NOTE — Progress Notes (Signed)
PROGRESS NOTE    An Schnabel  NOI:[id number] DOB: 09-16-1955 DOA: 04/26/2020 PCP: Melony Overly, MD    Brief Narrative:  Patient admitted to the hospital working diagnosis of post ERCP acute pancreatitis.  64 year old male with past medical history significant for hypertension. He underwent ERCP on 8/18 and biliary stent exchange for persistent postoperative bile leak, related to recent cholecystectomy with abscess status post percutaneous drainage.  Patient developed severe epigastric abdominal pain, associated with nausea, and vomiting after ERCP procedure.  On his initial physical examination blood pressure 137/88, heart rate 114, respiratory 23, oxygen saturation 92%, his lungs were clear to auscultation bilaterally, heart S1-S2, present, tachycardic, his abdomen was tender to palpation, he was distended, no rebound or guarding, no lower extremity edema.  CT of the abdomen pelvis show changes consistent with acute pancreatitis, no abscess or drainable fluid collection.  Patient was admitted to the medical ward, he received supportive medical therapy including intravenous fluids, analgesics and IV antibiotics.  Repeat CT of the abdomen pelvis 8/25 markedly diffuse pancreatic inflammation with fat stranding and free fluid.  His antibiotic therapy was switched from Zosyn to meropenem on 8/27.  He has been placed on tube feeds via NG tube.   His hospitalization has been complicated by metabolic encephalopathy, acute hypoxic respiratory failure, renal failure, new onset paroxysmal atrial fibrillation and acute ileus.  Today will hold on tube feedings and will allow him to have po intake.    Assessment & Plan:   Principal Problem:   Post-ERCP acute pancreatitis Active Problems:   Severe sepsis (HCC)   Polycythemia   AKI (acute kidney injury) ([place])   Metabolic acidosis   Acute respiratory failure with hypoxia (HCC)   Bilateral pleural effusion   Ileus (HCC)    1. Acute post  ERCP pancreatitis/ ileus. Back and abdominal pain have improved this am, no nausea or vomiting and has been tolerating tube feedings well. Positive bowel movement and flatus. Wbc continue trending down, today at 18,3. Abdominal film with signs of resolved ileus. Biliary drain 320 ml over last 24 H.   Today will allow patient to eat by mouth and will hold on tube feesdings  Pain control with hydromorphone as needed, will add scheduled acetaminophen.  Continue with antibiotic therapy with meropenem #7.  Pain control with acetaminophen/ ibuprofen scheduled, as needed oxycodone and hydromorphone.   2. Acute hypoxic respiratory failure. Stable oxygenation with oxymetry at 95% on room air. All incentive spirometer and out of bed to chair as tolerated tid with meals.   Continue with as needed bronchodilator therapy.   3. AKI with hypokalemia. Stable renal function with serum cr at 0,73, K at 4,6 and serum bicarbonate at 23. Mg 2.0  Will continue feedings by mouth, will follow on renal panel in am.   4. Paroxysmal atrial fibrillation/ transitory. Continue with rate control. Patient converted to simus rhythm. Has a low risk of thrombosis CHADS vasc score of 1. Will hold on antithrombotic therapy for now.   Inpatient electrocardiograms personally reviewed show sinus rhythm.  Continue with as needed metoprolol for now.   6. Anemia.   Stable hgb at 11,6 with hct at 36,9. Follow cell count in am.  7. Acute metabolic encephalopathy. Mentation has improved, no further agitation, his wife is at the bedside.   8. HTN. Continue holding blood pressure agents, at home on lisinopril and hctz combination.   9. Dyslipidemia. Holding statin therapy for now.   10. Depression. Resume sertraline.   11.  Transitory hyperglycemia. Hgb A1c 5.6.  Capillary glucose has remained stable 184, 187, 180, 166.  Will discontinue insulin therapy for now and continue close monitoring.   Patient continue to be at high  risk for worsening pancreatitis   Status is: Inpatient  Remains inpatient appropriate because:IV treatments appropriate due to intensity of illness or inability to take PO   Dispo:  Patient From: Home  Planned Disposition: [place]  Expected discharge date:   Medically stable for discharge: No    DVT prophylaxis: Enoxaparin   Code Status:   full  Family Communication:  I spoke with patient's wife at the bedside, we talked in detail about patient's condition, plan of care and prognosis and all questions were addressed.      Nutrition Status: Nutrition Problem: Inadequate oral intake Etiology: acute illness Signs/Symptoms: NPO status Interventions: Boost Breeze, MVI     Skin Documentation:     Consultants:   GI   IR     Antimicrobials:   meropenem    Subjective: Patient with positive bowel movement and passing flatus, no nausea or vomiting, continue ng tube in place and tolerating tube feedings, his back pain has improved with analgesics  Objective: Vitals:   05/09/20 1943 05/09/20 2000 05/10/20 0300 05/10/20 0823  BP: 113/71  110/67 113/81  Pulse: 94  90 93  Resp: 20  17 19   Temp: 98.9 F (37.2 C)  97.9 F (36.6 C) 98.1 F (36.7 C)  TempSrc: Oral  Axillary Oral  SpO2: 95% 100% 100% 95%  Weight:      Height:        Intake/Output Summary (Last 24 hours) at 05/10/2020 1009 Last data filed at 05/10/2020 0602 Gross per 24 hour  Intake --  Output 920 ml  Net -920 ml   Filed Weights   05/04/20 0500 05/07/20 0500 05/08/20 0357  Weight: 82.9 kg 86.6 kg 86.9 kg    Examination:   General: Not in pain or dyspnea, deconditioned  Neurology: Awake and alert, non focal  E ENT: mild pallor, no icterus, oral mucosa moist Cardiovascular: No JVD. S1-S2 present, rhythmic, no gallops, rubs, or murmurs. +/++ pitting bilateral lower extremity edema. Pulmonary: positive breath sounds bilaterally, adequate air movement, no wheezing, rhonchi or  rales. Mild decreased breath sounds at bases Gastrointestinal. Abdomen distended but not tender, soft with no masses. biliary drain in place with yellow fluid in the collecting bag.  Skin. No rashes Musculoskeletal: no joint deformities     Data Reviewed: I have personally reviewed following labs and imaging studies  CBC: Recent Labs  Lab 05/06/20 0146 05/07/20 0432 05/08/20 0245 05/09/20 0303 05/10/20 0426  WBC 31.1* 25.2* 19.5* 19.1* 18.3*  NEUTROABS 27.9* 22.2* 16.5* 15.8* 14.4*  HGB 12.3* 11.6* 10.5* 11.7* 11.6*  HCT 37.5* 36.4* 33.3* 37.3* 36.9*  MCV 88.9 90.5 89.3 91.0 91.1  PLT 279 326 366 479* 196*   Basic Metabolic Panel: Recent Labs  Lab 05/03/20 1602 05/04/20 0351 05/04/20 0351 05/05/20 0324 05/05/20 0324 05/06/20 0146 05/07/20 0432 05/08/20 0245 05/09/20 0303 05/10/20 0426  NA  --  136   < > 135   < > 134* 133* 134* 135 132*  K  --  2.4*   < > 3.0*   < > 3.6 4.1 3.9 4.6 4.6  CL  --  100   < > 100   < > 103 103 103 103 102  CO2  --  24   < > 24   < >  20* 21* 22 24 23   GLUCOSE  --  131*   < > 194*   < > 186* 216* 188* 183* 186*  BUN  --  17   < > 17   < > 19 15 15 11 13   CREATININE  --  0.76   < > 0.84   < > 0.67 0.74 0.75 0.71 0.73  CALCIUM  --  7.6*   < > 7.5*   < > 7.6* 7.6* 7.7* 8.0* 8.1*  MG  --  2.1  --  2.1  --   --   --  2.0  --   --   PHOS 3.2  --   --  3.4  --   --   --   --   --   --    < > = values in this interval not displayed.   GFR: Estimated Creatinine Clearance: 101.9 mL/min (by C-G formula based on SCr of 0.73 mg/dL). Liver Function Tests: Recent Labs  Lab 05/05/20 0324 05/06/20 0146 05/07/20 0432  AST 32 31 31  ALT 22 20 19   ALKPHOS 94 102 95  BILITOT 0.8 0.6 0.4  PROT 4.9* 4.8* 4.7*  ALBUMIN 1.7* 1.6* 1.5*   No results for input(s): LIPASE, AMYLASE in the last 168 hours. No results for input(s): AMMONIA in the last 168 hours. Coagulation Profile: No results for input(s): INR, PROTIME in the last 168 hours. Cardiac  Enzymes: No results for input(s): CKTOTAL, CKMB, CKMBINDEX, TROPONINI in the last 168 hours. BNP (last 3 results) No results for input(s): PROBNP in the last 8760 hours. HbA1C: No results for input(s): HGBA1C in the last 72 hours. CBG: Recent Labs  Lab 05/09/20 1623 05/09/20 1942 05/09/20 2316 05/10/20 0400 05/10/20 0802  GLUCAP 199* 184* 187* 180* 166*   Lipid Profile: No results for input(s): CHOL, HDL, LDLCALC, TRIG, CHOLHDL, LDLDIRECT in the last 72 hours. Thyroid Function Tests: No results for input(s): TSH, T4TOTAL, FREET4, T3FREE, THYROIDAB in the last 72 hours. Anemia Panel: No results for input(s): VITAMINB12, FOLATE, FERRITIN, TIBC, IRON, RETICCTPCT in the last 72 hours.    Radiology Studies: I have reviewed all of the imaging during this hospital visit personally     Scheduled Meds: . acetaminophen  650 mg Oral Q6H  . acidophilus  2 capsule Oral Daily  . Chlorhexidine Gluconate Cloth  6 each Topical Daily  . enoxaparin (LOVENOX) injection  40 mg Subcutaneous Q24H  . feeding supplement (ENSURE ENLIVE)  237 mL Oral BID BM  . feeding supplement (PROSource TF)  45 mL Per Tube TID  . ibuprofen  400 mg Oral TID  . insulin aspart  0-15 Units Subcutaneous Q4H  . mouth rinse  15 mL Mouth Rinse BID  . multivitamin with minerals  1 tablet Oral Daily  . pantoprazole  40 mg Oral Daily  . sodium chloride flush  10-40 mL Intracatheter Q12H   Continuous Infusions: . sodium chloride 10 mL/hr at 04/30/20 1835  . sodium chloride Stopped (05/07/20 0923)  . meropenem (MERREM) IV 1 g (05/10/20 0122)  . methocarbamol (ROBAXIN) IV 500 mg (05/09/20 1039)     LOS: 13 days        Abdi Husak Gerome Apley, MD

## 2020-05-10 NOTE — Progress Notes (Signed)
Pharmacy Antibiotic Note  Adam Fowler is a 64 y.o. male completed 7-day Zosyn course on 8/26 for intra-abdominal coverage/ bile leak/ post-ERCP pancreatitis, now broadened to Meropenem on 8/27 in the setting of continued leukocytosis - pharmacy consulted to dose.  Renal function stable, afebrile, WBC downtrending, no dose adjustments needed at this time.   Plan: Continue [place] will sign off as renal function is stable.  Thank you for the consult!  Abx LOT per GI.  Height: 5\' 9"  (175.3 cm) Weight: 86.9 kg (191 lb 9.3 oz) IBW/kg (Calculated) : 70.7  Temp (24hrs), Avg:98.3 F (36.8 C), Min:97.9 F (36.6 C), Max:98.9 F (37.2 C)  Recent Labs  Lab 05/06/20 0146 05/07/20 0432 05/08/20 0245 05/09/20 0303 05/10/20 0426  WBC 31.1* 25.2* 19.5* 19.1* 18.3*  CREATININE 0.67 0.74 0.75 0.71 0.73    Estimated Creatinine Clearance: 101.9 mL/min (by C-G formula based on SCr of 0.73 mg/dL).    No Known Allergies  Zosyn 8/19>>8/26 Vanc 8/22>>8/23 Merrem 8/27 >>  8/19 BCx: negative 8/23 MRSA PCR: negative 8/19 COVID: negative  Sameena Artus D. Mina Marble, PharmD, BCPS, [place] 05/10/2020, 10:43 AM

## 2020-05-10 NOTE — Progress Notes (Signed)
Nutrition Follow-up  DOCUMENTATION CODES:   Not applicable  INTERVENTION:   -Continue Ensure Enlive po BID, each supplement provides 350 kcal and 20 grams of protein -Continue MVI with minerals daily -Continue Magic cup TID with meals, each supplement provides 290 kcal and 9 grams of protein  NUTRITION DIAGNOSIS:   Inadequate oral intake related to acute illness as evidenced by NPO status.  Progressing  GOAL:   Patient will meet greater than or equal to 90% of their needs  Progressing   MONITOR:   PO intake, Supplement acceptance, Labs, Weight trends, TF tolerance, I & O's  REASON FOR ASSESSMENT:   Ventilator    ASSESSMENT:   64 year old male with PMH of HTN, ERCP on 8/18 s/p biliary stent exchange for persistent postoperative bile leak after recent cholecystectomy with abscess s/- percutaneous drainage. CT abdomen/pelvis showing acute pancreatitis. Pt admitted with severe sepsis secondary to post-ERCP pancreatitis.  8/21 - rapid response due to atrial fibrillation and WOB 8/22 - intubated 8/24- extubated 8/25- advanced to clear liquid diet 8/26- advanced to full liquid diet 8/27- advanced to carb modified diet, cortrak tube placed (gastric); tube advanced under fluroscopy 8/29- downgraded to full liquid diet 8/29- TF stopped due to complaints of abdominal pain; TF re-started at rate of 30 ml/hr  Reviewed I/O's: -920 ml x 24 hours and -7 L since admission  UOP: 600 ml x 24 hours  Drain output: 320 ml x 24 hours  Spoke with pt and wife at bedside. Pt pleasant and in good spirits and more animated than during previous visit. Pt is eager to remove cortrak, however, understands reasoning for keeping it in at this time. TF has been held today to help support oral intake.   Pt shares that he has little desire to eat as he has no taste for food, so it is not appealing. Pt consumed some Chick fil A chicken nuggets and nutter butters today. Pt reports he likes chocolate  Ensure and is willing to drink two per day. RD discussed importance of good meal and supplement intake to promote healing; pt very receptive to RD recommendations, as he is eager to remove cortrak tube.   Labs reviewed: Na: 132, CBGS: 166-180 (inpatient orders for glycemic control are none).   Diet Order:   Diet Order            Diet Carb Modified Fluid consistency: Thin; Room service appropriate? Yes  Diet effective now                 EDUCATION NEEDS:   Education needs have been addressed  Skin:  Skin Assessment: Skin Integrity Issues: Skin Integrity Issues:: Incisions Incisions: closed abdomen  Last BM:  05/09/20  Height:   Ht Readings from Last 1 Encounters:  05/02/20 5\' 9"  (1.753 m)    Weight:   Wt Readings from Last 1 Encounters:  05/08/20 86.9 kg    Ideal Body Weight:  72.7 kg  BMI:  Body mass index is 28.29 kg/m.  Estimated Nutritional Needs:   Kcal:  9622-2979  Protein:  130-145 grams  Fluid:  > 2.3 L    Loistine Chance, RD, LDN, [place] Registered Dietitian II Certified Diabetes Care and Education Specialist Please refer to [facility] for RD and/or RD on-call/weekend/after hours pager

## 2020-05-11 ENCOUNTER — Inpatient Hospital Stay (HOSPITAL_COMMUNITY): Payer: 59

## 2020-05-11 LAB — CBC WITH DIFFERENTIAL/PLATELET
Abs Immature Granulocytes: 0.36 10*3/uL — ABNORMAL HIGH (ref 0.00–0.07)
Abs Immature Granulocytes: 0.45 10*3/uL — ABNORMAL HIGH (ref 0.00–0.07)
Basophils Absolute: 0.1 10*3/uL (ref 0.0–0.1)
Basophils Absolute: 0.1 10*3/uL (ref 0.0–0.1)
Basophils Relative: 0 %
Basophils Relative: 1 %
Eosinophils Absolute: 0.2 10*3/uL (ref 0.0–0.5)
Eosinophils Absolute: 0.2 10*3/uL (ref 0.0–0.5)
Eosinophils Relative: 1 %
Eosinophils Relative: 1 %
HCT: 35.4 % — ABNORMAL LOW (ref 39.0–52.0)
HCT: 36.9 % — ABNORMAL LOW (ref 39.0–52.0)
Hemoglobin: 11.3 g/dL — ABNORMAL LOW (ref 13.0–17.0)
Hemoglobin: 11.7 g/dL — ABNORMAL LOW (ref 13.0–17.0)
Immature Granulocytes: 2 %
Immature Granulocytes: 3 %
Lymphocytes Relative: 8 %
Lymphocytes Relative: 9 %
Lymphs Abs: 1.2 10*3/uL (ref 0.7–4.0)
Lymphs Abs: 1.4 10*3/uL (ref 0.7–4.0)
MCH: 28.1 pg (ref 26.0–34.0)
MCH: 28.1 pg (ref 26.0–34.0)
MCHC: 31.9 g/dL (ref 30.0–36.0)
MCHC: 31.7 g/dL (ref 30.0–36.0)
MCV: 88.1 fL (ref 80.0–100.0)
MCV: 88.7 fL (ref 80.0–100.0)
Monocytes Absolute: 1 10*3/uL (ref 0.1–1.0)
Monocytes Absolute: 1.1 10*3/uL — ABNORMAL HIGH (ref 0.1–1.0)
Monocytes Relative: 6 %
Monocytes Relative: 7 %
Neutro Abs: 12.2 10*3/uL — ABNORMAL HIGH (ref 1.7–7.7)
Neutro Abs: 12.3 10*3/uL — ABNORMAL HIGH (ref 1.7–7.7)
Neutrophils Relative %: 83 %
Neutrophils Relative %: 79 %
Platelets: 449 10*3/uL — ABNORMAL HIGH (ref 150–400)
Platelets: 438 10*3/uL — ABNORMAL HIGH (ref 150–400)
RBC: 4.02 MIL/uL — ABNORMAL LOW (ref 4.22–5.81)
RBC: 4.16 MIL/uL — ABNORMAL LOW (ref 4.22–5.81)
RDW: 14.4 % (ref 11.5–15.5)
RDW: 14.3 % (ref 11.5–15.5)
WBC: 15.1 10*3/uL — ABNORMAL HIGH (ref 4.0–10.5)
WBC: 15.5 10*3/uL — ABNORMAL HIGH (ref 4.0–10.5)
nRBC: 0 % (ref 0.0–0.2)
nRBC: 0 % (ref 0.0–0.2)

## 2020-05-11 LAB — BASIC METABOLIC PANEL
Anion gap: 7 (ref 5–15)
BUN: 15 mg/dL (ref 8–23)
CO2: 23 mmol/L (ref 22–32)
Calcium: 8.1 mg/dL — ABNORMAL LOW (ref 8.9–10.3)
Chloride: 103 mmol/L (ref 98–111)
Creatinine, Ser: 0.66 mg/dL (ref 0.61–1.24)
GFR calc Af Amer: >60 mL/min (ref >60)
GFR calc non Af Amer: >60 mL/min (ref >60)
Glucose, Bld: 145 mg/dL — ABNORMAL HIGH (ref 70–99)
Potassium: 4.6 mmol/L (ref 3.5–5.1)
Sodium: 133 mmol/L — ABNORMAL LOW (ref 135–145)

## 2020-05-11 LAB — GLUCOSE, CAPILLARY
Glucose-Capillary: 152 mg/dL — ABNORMAL HIGH (ref 70–99)
Glucose-Capillary: 146 mg/dL — ABNORMAL HIGH (ref 70–99)

## 2020-05-11 MED ORDER — PRAVASTATIN SODIUM 10 MG PO TABS
20.0000 mg | ORAL_TABLET | Freq: Every day | ORAL | Status: DC
  Administered 2020-05-11 – 2020-05-30 (×20): 20 mg via ORAL
  Filled 2020-05-11 (×19): qty 2

## 2020-05-11 MED ORDER — CALCIUM CARBONATE ANTACID 500 MG PO CHEW: 1.0000 | CHEWABLE_TABLET | Freq: Three times a day (TID) | ORAL | Status: DC

## 2020-05-11 MED ORDER — CALCIUM CARBONATE ANTACID 500 MG PO CHEW
1.0000 | CHEWABLE_TABLET | Freq: Three times a day (TID) | ORAL | Status: DC
  Administered 2020-05-11 – 2020-05-20 (×22): 200 mg via ORAL
  Filled 2020-05-11 (×25): qty 1

## 2020-05-11 MED ORDER — PROSOURCE TF PO LIQD: 45.0000 mL | Freq: Two times a day (BID) | ORAL | Status: DC

## 2020-05-11 NOTE — Progress Notes (Signed)
Physical Therapy Treatment Patient Details Name: Adam Fowler MRN: [id number] DOB: 06/25/1956 Today's Date: 05/11/2020    History of Present Illness Pt is a 64 yo male who presents to the ED with severe abdominal pain, nausea and vomiting. Found to have post ERCP acute pancreatits. Pt developed Hypoxic resp failure and was Intubated 8/22  and extubated 8/24. Pt also with new onset a fib and acute ileus. PMH includes HTN and ERCP.     PT Comments    The pt is progressing well with mobility this afternoon, as he was able to complete hallway ambulation with HHA of 1 for support and stability, but was able to progress to 24ft distances at a time before requiring seated rest. The pt continues to have difficulty with onset of fatigue, SOB, and limited endurance at this time, but was able to maintain SpO2 >92% with mobility on RA. The pt also continues to have deficits in static and dynamic stability, taking multiple staggering steps while ambulating, requiring minA to recover. The pt will continue to benefit from skilled PT to progress endurance, stability, and independence with mobility both acutely and following d/c.     Follow Up Recommendations  Home health PT;Supervision for mobility/OOB     Equipment Recommendations  Rolling walker with 5" wheels;3in1 (PT)    Recommendations for Other Services       Precautions / Restrictions Precautions Precautions: Fall Precaution Comments: abdominal drain Restrictions Weight Bearing Restrictions: No    Mobility  Bed Mobility Overal bed mobility: Needs Assistance Bed Mobility: Supine to Sit;Sit to Supine     Supine to sit: Min guard;HOB elevated Sit to supine: Supervision   General bed mobility comments: pt able to rise without physical assist, does use extra time and bed rails, elevated HOB  Transfers Overall transfer level: Needs assistance Equipment used: 1 person hand held assist Transfers: Sit to/from Stand Sit to Stand: Min  assist         General transfer comment: minA through HHA to steady, no assist to power up to standing.  Ambulation/Gait Ambulation/Gait assistance: Min assist Gait Distance (Feet): 60 Feet (x3) Assistive device: 1 person hand held assist Gait Pattern/deviations: Step-through pattern;Staggering left;Staggering right;Decreased stride length Gait velocity: decreased Gait velocity interpretation: <1.8 ft/sec, indicate of risk for recurrent falls General Gait Details: pt unsteady with multiple LOB requiring minA to steady. SOB after ~50-60 ft requiring standing or sitting rest break   Stairs Stairs: Yes Stairs assistance: Min assist Stair Management: One rail Right;Step to pattern;Forwards Number of Stairs: 2 General stair comments: pt able to complete step-to pattern with one hand on rail and one with HHA. can step with either foot leading. no LOB with BUE support. fatigues and SOB after 2 steps      Balance Overall balance assessment: Needs assistance Sitting-balance support: Feet supported;No upper extremity supported Sitting balance-Leahy Scale: Good Sitting balance - Comments: no LOB with donning socks   Standing balance support: Bilateral upper extremity supported;Single extremity supported;During functional activity Standing balance-Leahy Scale: Poor Standing balance comment: Required at least 1 UE support during toileting ADLs                            Cognition Arousal/Alertness: Awake/alert Behavior During Therapy: Impulsive Overall Cognitive Status: Impaired/Different from baseline Area of Impairment: Attention;Memory;Following commands;Safety/judgement;Awareness;Problem solving                   Current Attention Level: Sustained Memory: Decreased  short-term memory Following Commands: Follows one step commands with increased time Safety/Judgement: Decreased awareness of safety;Decreased awareness of deficits Awareness: Emergent Problem  Solving: Difficulty sequencing;Slow processing;Requires verbal cues;Requires tactile cues General Comments: benefits from specific instructions and cues for sequencing/safety. impusive with mobility and decreased awareness of fall risk/safe movements      Exercises      General Comments General comments (skin integrity, edema, etc.): pt on RA, SpO2 low of 92% with mobility, 94-98% when seated rest after mobility. HR 100-110 bpm      Pertinent Vitals/Pain Pain Assessment: Faces Faces Pain Scale: Hurts a little bit Pain Location: abdomen Pain Descriptors / Indicators: Grimacing;Guarding Pain Intervention(s): Limited activity within patient's tolerance;Monitored during session           PT Goals (current goals can now be found in the care plan section) Acute Rehab PT Goals Patient Stated Goal: to walk PT Goal Formulation: With patient Time For Goal Achievement: 05/16/20 Potential to Achieve Goals: Good Progress towards PT goals: Progressing toward goals    Frequency    Min 3X/week      PT Plan Discharge plan needs to be updated;Frequency needs to be updated       AM-PAC PT "6 Clicks" Mobility   Outcome Measure  Help needed turning from your back to your side while in a flat bed without using bedrails?: None Help needed moving from lying on your back to sitting on the side of a flat bed without using bedrails?: None Help needed moving to and from a bed to a chair (including a wheelchair)?: A Little Help needed standing up from a chair using your arms (e.g., wheelchair or bedside chair)?: A Little Help needed to walk in hospital room?: A Little Help needed climbing 3-5 steps with a railing? : A Little 6 Click Score: 20    End of Session Equipment Utilized During Treatment: Gait belt Activity Tolerance: Patient tolerated treatment well Patient left: with call bell/phone within reach;in bed Nurse Communication: Mobility status PT Visit Diagnosis: Unsteadiness on feet  (R26.81);Muscle weakness (generalized) (M62.81);Difficulty in walking, not elsewhere classified (R26.2)     Time: 3570-1779 PT Time Calculation (min) (ACUTE ONLY): 23 min  Charges:  $Gait Training: 23-37 mins                     Karma Ganja, PT, DPT   Acute Rehabilitation Department Pager #: [phone]   Otho Bellows 05/11/2020, 5:09 PM

## 2020-05-11 NOTE — TOC Progression Note (Signed)
Transition of Care [facility]) - Progression Note    Patient Details  Name: Adam Fowler MRN: [id number] Date of Birth: Jan 08, 1956  [facility]) CM/SW Contact  Curlene Labrum, RN Phone Number: 05/11/2020, 1:12 PM  Clinical Narrative:    Case Management met with the patient and wife at the bedside to discuss transitions of care.  The patient is having a repeat abdominal xray and may be having Cortrak discontinued today.  The patient is also on room air and expressing decline of home health services and SNF placement.  The patient and wife state that they feel like the patient is doing better and would not need either service at this patient.  The patient and wife were given information regarding home health services or outpatient rehab if they so choose.  I will continue to follow the patient for discharge plans at a later date.  The patient currently has a rolling walker and raised toilet seat at home and will not need DME at home per the wife.   Expected Discharge Plan: Home/Self Care Barriers to Discharge: Continued Medical Work up  Expected Discharge Plan and Services Expected Discharge Plan: Home/Self Care   Discharge Planning Services: CM Consult Post Acute Care Choice: [place] - declined by patient/family - home with self care Living arrangements for the past 2 months: Single Family Home                                       Social Determinants of Health (SDOH) Interventions    Readmission Risk Interventions Readmission Risk Prevention Plan 05/04/2020  Transportation Screening Complete  PCP or Specialist Appt within 5-7 Days Complete  Home Care Screening Complete  Medication Review (RN CM) Complete  Some recent data might be hidden

## 2020-05-11 NOTE — Progress Notes (Addendum)
Daily Rounding Note  05/11/2020, 12:03 PM  LOS: 14 days   SUBJECTIVE:   Chief complaint:    Pancreatitis.  Bile leak.  Biloma/abscess  TF discontinued, coretrack to be pulled.  Eating ~ 50% of meals as well as outside food and snacks.   ~ 4 to 5 loose stools last night, none this AM.   Abdominal pain not an issue.  Last oxycodone was 330 AM today.    Bile drainage increasing: 20 >> 320 >> 475 mL to gravity drain in previous 3 days.  At least 200 in bag currently.       Ambulating more easily in hall way  Asking if he can get lasix dose for his LE/foot swelling.    OBJECTIVE:         Vital signs in last 24 hours:    Temp:  [97.5 F (36.4 C)-98.2 F (36.8 C)] 98.2 F (36.8 C) (09/03 0837) Pulse Rate:  [79-85] 82 (09/03 0837) Resp:  [17-18] 18 (09/03 0837) BP: (110-115)/(73-75) 111/74 (09/03 0837) SpO2:  [98 %-99 %] 99 % (09/03 0837) Last BM Date: 05/09/20 Filed Weights   05/04/20 0500 05/07/20 0500 05/08/20 0357  Weight: 82.9 kg 86.6 kg 86.9 kg   General: lookds better, more comfortable, relaxed.  Not diaphoretic   Heart: RRR Chest: clear bil.  No dyspnea or cough Abdomen: soft, NT, ND.  Active BS  Extremities: LE/pedal edema Neuro/Psych:  Oriented x 3.  No gross deficits.  More engaged and affect better.    Intake/Output from previous day: 09/02 0701 - 09/03 0700 In: -  Out: 475 [Drains:475]  Intake/Output this shift: Total I/O In: -  Out: 600 [Urine:600]  Lab Results: Recent Labs    05/09/20 0303 05/10/20 0426 05/11/20 0332  WBC 19.1* 18.3* 15.1*  HGB 11.7* 11.6* 11.3*  HCT 37.3* 36.9* 35.4*  PLT 479* 483* 449*   BMET Recent Labs    05/09/20 0303 05/10/20 0426 05/11/20 0332  NA 135 132* 133*  K 4.6 4.6 4.6  CL 103 102 103  CO2 24 23 23   GLUCOSE 183* 186* 145*  BUN 11 13 15   CREATININE 0.71 0.73 0.66  CALCIUM 8.0* 8.1* 8.1*    Studies/Results: DG Abd 1 View  Result Date:  05/10/2020 CLINICAL DATA:  Abdominal distention.  Biliary stent. EXAM: ABDOMEN - 1 VIEW COMPARISON:  05/04/2020. FINDINGS: Feeding tube tip now noted at the level of the distal stomach/duodenal bulb. Right upper quadrant drainage catheter and biliary stent stable position. No bowel distention. No acute bony abnormality. IMPRESSION: Feeding tube tip now noted the level of the distal stomach/duodenal bulb. Right upper quadrant drainage catheter and biliary stent in stable position. No bowel distention. Electronically Signed   By: Marcello Moores  Register   On: 05/10/2020 06:34    ASSESMENT:   *Post ERCP pancreatitis 6/7 perc drain, 7/14 Bile duct stent for post op bile leak.  Stent replaced 8/18.JP drain in place, output  JP drain to abscess/biloma placed on 6/7. High output drainage noted.  WBCs 32.3 >> 15.1,continue improving trend. No fevers. Day 9 Meropenem, previous zosyn. Latest CT 8/26.   *   Protein cal malnutrition.  Tube feeds and prosource discontinued.    Ensure, MVI, magic cup supplements in place. RD following.    * Elevated blood sugars. SSI in place.   *   Hyponatremia.    * Debilitation, fall risk. Improving w PT and OT    *  Loose, frequent stools.  Stool path PCR negative, c diff pndg.  TFs and abx associated (Not C diff) could also be causes     PLAN  *  At least 14 d of meropenem.     *   ? Timing of next CTAP?   *   Check KUB to assess position of stent.  Given high bile output, this will assess for possible migration of stent.   Azucena Freed  05/11/2020, 12:03 PM Phone [phone]    Attending Physician Note   I have taken an interval history, reviewed the chart and examined the patient. I agree with the Advanced Practitioner's note, impression and recommendations.   * Pancreatitis improving. Removing enteric feeding tube today. Loose stools likely from TFs or antibiotic side effect.  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  Significant increase in drainage over the past few days (20 to 320 to 475). Recheck KUB to assess stent position. Continue Meropenem for at least a 14 day course. Review mgmt options with Dr. Rush Landmark early next week.   Lucio Edward, MD [facility]

## 2020-05-11 NOTE — Progress Notes (Signed)
PROGRESS NOTE    Adam Fowler  WCH:[id number] DOB: [birth year] DOA: 04/26/2020 PCP: Melony Overly, MD    Brief Narrative:  Patient admitted to the hospital working diagnosis of post ERCP acute pancreatitis.  64 year old male with past medical history significant for hypertension. Heunderwent ERCP on 8/18 and biliary stent exchange for persistent postoperative bile leak,related to recent cholecystectomy with abscess status post percutaneous drainage. Patient developed severe epigastric abdominal pain, associated with nausea, and vomiting after ERCP procedure. On his initial physical examination blood pressure 137/88, heart rate 114, respiratory 23, oxygen saturation 92%,his lungs were clear to auscultation bilaterally, heart S1-S2, present, tachycardic, his abdomen was tender to palpation, he was distended, no rebound or guarding,no lower extremity edema.  CT of the abdomen pelvis show changes consistent with acute pancreatitis, no abscess or drainable fluid collection. Patient was admitted to the medical ward, he received supportive medical therapy including intravenous fluids, analgesics and IV antibiotics.   His hospitalization has been complicated by metabolic encephalopathy,acute hypoxicrespiratory failure, renal failure, new onset paroxysmal atrial fibrillation and acute ileus.  08/21-08/22 rapid response due to increase work of breathing and atrial fibrillation, required invasive mechanical ventilation. He was treated for volume overload and aspiration pneumonia. Received diuresis and antibiotic therapy with Zosyn.   Liberated from mechanical ventilation on 08/24 and transitioned to nasal cannula with good toleration.   Patient continue to have leukocytosis and abdominal pain, repeat CT of the abdomen pelvis (8/25) markedly diffuse pancreatic inflammation with fat stranding and free fluid.  His antibiotic therapy was switched from Zosyn to meropenem on 8/27. He was placed  on tube feeds via NG tube.  Patient clinically improved, NG tube will be removed today and continue po nutrition.     Assessment & Plan:   Principal Problem:   Post-ERCP acute pancreatitis Active Problems:   Severe sepsis (HCC)   Polycythemia   AKI (acute kidney injury) ([place])   Metabolic acidosis   Acute respiratory failure with hypoxia (HCC)   Bilateral pleural effusion   Ileus (HCC)   1. Acute post ERCP pancreatitis/ ileus. His tube feedings have been on hold for 24 H with good toleration. No nausea or vomiting and positive bowel movements, abdominal pain has improved.   Wbc down to 15,1. Biliary drain 475 over last 24 H.   Antibiotic therapy with meropenem #8/14.   Pain control with acetaminophen/ ibuprofen scheduled, as needed oxycodone and hydromorphone. Out of bed to chair tid with meals, continue working with physical and occupational therapy. Ambulate in the hall.   Follow IR and GI recommendations.   2. Acute hypoxic respiratory failure. Continue with incentive spirometer. Increase mobility as tolerated.   On PRN bronchodilator therapy.   3. AKIwith hypokalemia. Renal function with stable serum cr at 0,66 with K at 4.6 and serum bicarbonate at 23.   Will continue close monitoring of renal function and electrolytes. He has edema lower extremities, not from fluid overload. Likely from hypoproteinemia, Will hold on diuretic therapy for now, that can affect renal function and electrolytes.   4. Paroxysmal atrial fibrillation/ transitory. Converted to simus rhythm. Has a low risk of thrombosis CHADS vasc score of 1. Will hold on antithrombotic therapy for now.   Continue telemetry monitoring.    6. Anemia of chronic disease. Stable Hgb and Hct, today 11,3 and 35.4.   7. Acute metabolic encephalopathy/ depression and anxiety. his wife is at the bedside, mentation continue to improve. Resumed on sertraline per his home regimen. He reports episodic  anxiety events.    8. HTN/ dyslipidemia. ( At home on lisinopril and hctz combination). His blood pressure has remained stable, will continue to hold on antihypertensive medications.    Will resume statin therapy low dose pravastatin.   11. Transitory hyperglycemia. Hgb A1c 5.6. fasting glucose is 145 and patient tolerating po well, tube feedings have been discontinued.   12. Moderate calorie protein malnutrition. Continue with nutritional supplementation.   Status is: Inpatient  Patient continue to be at high risk for worsening pancreatitis.   Remains inpatient appropriate because:IV treatments appropriate due to intensity of illness or inability to take PO   Dispo:  Patient From: Home  Planned Disposition: [place]  Expected discharge date: 05/13/20  Medically stable for discharge: No    DVT prophylaxis: Enoxaparin   Code Status:    full  Family Communication:  I spoke with patient's wife at the bedside, we talked in detail about patient's condition, plan of care and prognosis and all questions were addressed.      Nutrition Status: Nutrition Problem: Inadequate oral intake Etiology: acute illness Signs/Symptoms: NPO status Interventions: Ensure Enlive (each supplement provides 350kcal and 20 grams of protein), Magic cup, MVI    Consultants:   GI   IR     Antimicrobials:   meropenem      Subjective: Patient is feeling better, no nausea or vomiting and his abdominal pain has improved, positive bowel movements. He continue to be very weak and deconditioned, no chest pain or dyspnea, positive lower extremity edema.   Objective: Vitals:   05/10/20 0823 05/10/20 2044 05/11/20 0346 05/11/20 0837  BP: 113/81 115/75 110/73 111/74  Pulse: 93 85 79 82  Resp: 19 18 17 18   Temp: 98.1 F (36.7 C) (!) 97.5 F (36.4 C) 98 F (36.7 C) 98.2 F (36.8 C)  TempSrc: Oral Oral Oral Oral  SpO2: 95% 98% 98% 99%  Weight:      Height:        Intake/Output Summary  (Last 24 hours) at 05/11/2020 1129 Last data filed at 05/11/2020 6378 Gross per 24 hour  Intake --  Output 975 ml  Net -975 ml   Filed Weights   05/04/20 0500 05/07/20 0500 05/08/20 0357  Weight: 82.9 kg 86.6 kg 86.9 kg    Examination:   General: Not in pain or dyspnea, deconditioned and ill looking appearing  Neurology: Awake and alert, non focal  E ENT: mild pallor, no icterus, oral mucosa moist Cardiovascular: No JVD. S1-S2 present, rhythmic, no gallops, rubs, or murmurs. +/++ pitting bilateral lower extremity edema. Pulmonary: positive breath sounds bilaterally,  Gastrointestinal. Abdomen distended but not tender, no rebound or gurarding Skin. No rashes Musculoskeletal: no joint deformities     Data Reviewed: I have personally reviewed following labs and imaging studies  CBC: Recent Labs  Lab 05/07/20 0432 05/08/20 0245 05/09/20 0303 05/10/20 0426 05/11/20 0332  WBC 25.2* 19.5* 19.1* 18.3* 15.1*  NEUTROABS 22.2* 16.5* 15.8* 14.4* 12.2*  HGB 11.6* 10.5* 11.7* 11.6* 11.3*  HCT 36.4* 33.3* 37.3* 36.9* 35.4*  MCV 90.5 89.3 91.0 91.1 88.1  PLT 326 366 479* 483* 588*   Basic Metabolic Panel: Recent Labs  Lab 05/05/20 0324 05/06/20 0146 05/07/20 0432 05/08/20 0245 05/09/20 0303 05/10/20 0426 05/11/20 0332  NA 135   < > 133* 134* 135 132* 133*  K 3.0*   < > 4.1 3.9 4.6 4.6 4.6  CL 100   < > 103 103 103 102 103  CO2  24   < > 21* 22 24 23 23   GLUCOSE 194*   < > 216* 188* 183* 186* 145*  BUN 17   < > 15 15 11 13 15   CREATININE 0.84   < > 0.74 0.75 0.71 0.73 0.66  CALCIUM 7.5*   < > 7.6* 7.7* 8.0* 8.1* 8.1*  MG 2.1  --   --  2.0  --   --   --   PHOS 3.4  --   --   --   --   --   --    < > = values in this interval not displayed.   GFR: Estimated Creatinine Clearance: 101.9 mL/min (by C-G formula based on SCr of 0.66 mg/dL). Liver Function Tests: Recent Labs  Lab 05/05/20 0324 05/06/20 0146 05/07/20 0432  AST 32 31 31  ALT 22 20 19   ALKPHOS 94 102 95   BILITOT 0.8 0.6 0.4  PROT 4.9* 4.8* 4.7*  ALBUMIN 1.7* 1.6* 1.5*   No results for input(s): LIPASE, AMYLASE in the last 168 hours. No results for input(s): AMMONIA in the last 168 hours. Coagulation Profile: No results for input(s): INR, PROTIME in the last 168 hours. Cardiac Enzymes: No results for input(s): CKTOTAL, CKMB, CKMBINDEX, TROPONINI in the last 168 hours. BNP (last 3 results) No results for input(s): PROBNP in the last 8760 hours. HbA1C: No results for input(s): HGBA1C in the last 72 hours. CBG: Recent Labs  Lab 05/09/20 2316 05/10/20 0400 05/10/20 0802 05/10/20 1214 05/11/20 0829  GLUCAP 187* 180* 166* 178* 152*   Lipid Profile: No results for input(s): CHOL, HDL, LDLCALC, TRIG, CHOLHDL, LDLDIRECT in the last 72 hours. Thyroid Function Tests: No results for input(s): TSH, T4TOTAL, FREET4, T3FREE, THYROIDAB in the last 72 hours. Anemia Panel: No results for input(s): VITAMINB12, FOLATE, FERRITIN, TIBC, IRON, RETICCTPCT in the last 72 hours.    Radiology Studies: I have reviewed all of the imaging during this hospital visit personally     Scheduled Meds:  acetaminophen  650 mg Oral Q6H   acidophilus  2 capsule Oral Daily   Chlorhexidine Gluconate Cloth  6 each Topical Daily   enoxaparin (LOVENOX) injection  40 mg Subcutaneous Q24H   feeding supplement (ENSURE ENLIVE)  237 mL Oral BID BM   ibuprofen  400 mg Oral TID   mouth rinse  15 mL Mouth Rinse BID   multivitamin with minerals  1 tablet Oral Daily   pantoprazole  40 mg Oral Daily   sertraline  100 mg Oral Daily   sodium chloride flush  10-40 mL Intracatheter Q12H   zolpidem  5 mg Oral QHS   Continuous Infusions:  sodium chloride Stopped (05/07/20 0923)   meropenem (MERREM) IV 1 g (05/11/20 1059)     LOS: 14 days        Erskin Zinda Gerome Apley, MD \

## 2020-05-11 NOTE — Plan of Care (Signed)

## 2020-05-11 NOTE — Progress Notes (Signed)
Nutrition Follow-up  DOCUMENTATION CODES:   Not applicable  INTERVENTION:   -D/c Prosource TF -Continue Ensure Enlive po BID, each supplement provides 350 kcal and 20 grams of protein -Continue MVI with minerals daily -Continue Magic cup TID with meals, each supplement provides 290 kcal and 9 grams of protein  NUTRITION DIAGNOSIS:   Inadequate oral intake related to acute illness as evidenced by NPO status.  Progressing  GOAL:   Patient will meet greater than or equal to 90% of their needs  Progressing   MONITOR:   PO intake, Supplement acceptance, Labs, Weight trends, TF tolerance, I & O's  REASON FOR ASSESSMENT:   Ventilator    ASSESSMENT:   64 year old male with PMH of HTN, ERCP on 8/18 s/p biliary stent exchange for persistent postoperative bile leak after recent cholecystectomy with abscess s/- percutaneous drainage. CT abdomen/pelvis showing acute pancreatitis. Pt admitted with severe sepsis secondary to post-ERCP pancreatitis.  8/21 - rapid response due to atrial fibrillation and WOB 8/22 - intubated 8/24- extubated 8/25- advanced to clear liquid diet 8/26- advanced to full liquid diet 8/27- advanced to carb modified diet, cortrak tube placed (gastric); tubeadvancedunder fluroscopy 8/29- downgraded to full liquid diet 8/29- TF stopped due to complaints of abdominal pain; TF re-started at rate of 30 ml/hr 9/2- TF on hold 9/3- TF d/c  Reviewed I/O's: -475 ml x 24 hours  Drain output: 475 ml x 24 hours  Case discussed with pharmacist; TF has been d/c. Prosource TF and TF formula jhave been d/c.   No meal completion records available to assess. Per pt wife, he has been consuming about 50% of meals and has been bringing him in outside food and snacks. He has also been consuming Ensure Enlive supplements. When RD visited pt yesterday, discussed ways to help increase oral intake, especially since he will not be reliant on TF for additional nutrition  support  Labs reviewed: Na: 132, CBGS: 166-178.   Diet Order:   Diet Order            Diet Carb Modified Fluid consistency: Thin; Room service appropriate? Yes  Diet effective now                 EDUCATION NEEDS:   Education needs have been addressed  Skin:  Skin Assessment: Skin Integrity Issues: Skin Integrity Issues:: Incisions Incisions: closed abdomen  Last BM:  05/09/20  Height:   Ht Readings from Last 1 Encounters:  05/02/20 5\' 9"  (1.753 m)    Weight:   Wt Readings from Last 1 Encounters:  05/08/20 86.9 kg    Ideal Body Weight:  72.7 kg  BMI:  Body mass index is 28.29 kg/m.  Estimated Nutritional Needs:   Kcal:  3428-7681  Protein:  130-145 grams  Fluid:  > 2.3 L   Loistine Chance, RD, LDN, [place] Registered Dietitian II Certified Diabetes Care and Education Specialist Please refer to [facility] for RD and/or RD on-call/weekend/after hours pager

## 2020-05-12 LAB — COMPREHENSIVE METABOLIC PANEL
ALT: 18 U/L (ref 0–44)
AST: 22 U/L (ref 15–41)
Albumin: 1.7 g/dL — ABNORMAL LOW (ref 3.5–5.0)
Alkaline Phosphatase: 72 U/L (ref 38–126)
Anion gap: 9 (ref 5–15)
BUN: 12 mg/dL (ref 8–23)
CO2: 23 mmol/L (ref 22–32)
Calcium: 8.4 mg/dL — ABNORMAL LOW (ref 8.9–10.3)
Chloride: 102 mmol/L (ref 98–111)
Creatinine, Ser: 0.64 mg/dL (ref 0.61–1.24)
GFR calc Af Amer: >60 mL/min (ref >60)
GFR calc non Af Amer: >60 mL/min (ref >60)
Glucose, Bld: 134 mg/dL — ABNORMAL HIGH (ref 70–99)
Potassium: 4.5 mmol/L (ref 3.5–5.1)
Sodium: 134 mmol/L — ABNORMAL LOW (ref 135–145)
Total Bilirubin: 0.9 mg/dL (ref 0.3–1.2)
Total Protein: 4.6 g/dL — ABNORMAL LOW (ref 6.5–8.1)

## 2020-05-12 LAB — GLUCOSE, CAPILLARY: Glucose-Capillary: 117 mg/dL — ABNORMAL HIGH (ref 70–99)

## 2020-05-12 NOTE — Progress Notes (Signed)
    Progress Note   Subjective  Cortrak tube removed. Pt is tolerating solid food without problems.    Objective  Vital signs in last 24 hours: Temp:  [97.8 F (36.6 C)-98.7 F (37.1 C)] 98 F (36.7 C) (09/04 0100) Pulse Rate:  [80-97] 92 (09/04 0100) Resp:  [16-18] 16 (09/04 0100) BP: (107-123)/(77-82) 123/77 (09/04 0100) SpO2:  [97 %-100 %] 97 % (09/04 0100) Last BM Date: 05/11/20  General: Alert, well-developed, in NAD Heart:  Regular rate and rhythm; no murmurs Chest: Clear to ascultation bilaterally Abdomen:  Soft, nontender and mildly distended. Normal bowel sounds, without guarding, and without rebound.   Extremities:  Without edema. Neurologic:  Alert and  oriented x4; grossly normal neurologically. Psych:  Alert and cooperative. Normal mood and affect.  Intake/Output from previous day: 09/03 0701 - 09/04 0700 In: 1380 [P.O.:480; IV Piggyback:900] Out: 0272 [Urine:1100; Drains:325; Stool:350] Intake/Output this shift: No intake/output data recorded.  Lab Results: Recent Labs    05/10/20 0426 05/11/20 0332 05/11/20 1712  WBC 18.3* 15.1* 15.5*  HGB 11.6* 11.3* 11.7*  HCT 36.9* 35.4* 36.9*  PLT 483* 449* 438*   BMET Recent Labs    05/10/20 0426 05/11/20 0332 05/12/20 0334  NA 132* 133* 134*  K 4.6 4.6 4.5  CL 102 103 102  CO2 23 23 23   GLUCOSE 186* 145* 134*  BUN 13 15 12   CREATININE 0.73 0.66 0.64  CALCIUM 8.1* 8.1* 8.4*   LFT Recent Labs    05/12/20 0334  PROT 4.6*  ALBUMIN 1.7*  AST 22  ALT 18  ALKPHOS 72  BILITOT 0.9    Studies/Results: DG Abd 1 View  Result Date: 05/11/2020 CLINICAL DATA:  Displacement of biliary stent. EXAM: ABDOMEN - 1 VIEW COMPARISON:  05/10/2020 FINDINGS: Biliary stent in the region the common bile duct unchanged. Gastric tube in the gastric antrum. Pigtail catheter overlying the liver unchanged. Normal bowel gas pattern. IMPRESSION: Biliary stent unchanged position in the in the region of the common bile duct.  Feeding tube in the gastric antrum. Electronically Signed   By: Franchot Gallo M.D.   On: 05/11/2020 13:23      Assessment & Recommendations   1. Post ERCP pancreatitis, resolving. Cortrak tube removed and tolerating solid foods. He has made significant improvement over the past several days. Ambulate and assess.   2. 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 325cc drainage over last day. KUBs reviewed and stent appears to be in the same position. Stent does not appear to have migrated. Will review plans with Dr. Rush Landmark next week. Continue IV meropenem. If he continues to do well consider change to po antibiotics after 10 days of IV meropenem to complete 14 days of antibiotics.     LOS: 15 days   Sajad Glander T. Fuller Plan MD 05/12/2020, 11:53 AM

## 2020-05-12 NOTE — Progress Notes (Signed)
PROGRESS NOTE    Adam Fowler  OAC:[id number] DOB: 09-May-1956 DOA: 04/26/2020 PCP: Melony Overly, MD    Brief Narrative:  Patient admitted to the hospital working diagnosis of post ERCP acute pancreatitis. Prolonged hospitalization with multiple complications, including respiratory failure (required mechanical ventilation), AKI, encephalopathy and atrial fibrillation.  64 year old male with past medical history significant for hypertension. Heunderwent ERCP on 8/18 and biliary stent exchange for persistent postoperative bile leak,related to recent cholecystectomy with abscess status post percutaneous drainage. Patient developed severe epigastric abdominal pain, associated with nausea, and vomiting after ERCP procedure. On his initial physical examination blood pressure 137/88, heart rate 114, respiratory rate 23, oxygen saturation 92%,his lungs were clear to auscultation bilaterally, heart S1-S2, present, tachycardic, his abdomen was tender to palpation, it was distended, no rebound or guarding,no lower extremity edema.  CT of the abdomen pelvis show changes consistent with acute pancreatitis, no abscess or drainable fluid collection. Patient was admitted to the medical ward, he received supportive medical therapy including intravenous fluids, analgesics and IV antibiotics.   His hospitalization has been complicated by metabolic encephalopathy,acute hypoxicrespiratory failure, renal failure, new onset paroxysmal atrial fibrillation and acute ileus.  08/21-08/22 rapid response due to increase work of breathing and atrial fibrillation, required invasive mechanical ventilation. He was treated for volume overload and aspiration pneumonia. Received diuresis and antibiotic therapy with Zosyn.   Liberated from mechanical ventilation on 08/24 and transitioned to nasal cannula with good toleration.   Patient continue to have leukocytosis and abdominal pain, repeat CT of the abdomen  pelvis (8/25) markedly diffuse pancreatic inflammation with fat stranding and free fluid.  His antibiotic therapy was switched from Zosyn to meropenem on 8/27. He was placed on tube feeds via NG tube for several days.   On 09/03 Ng tube was removed, patient tolerating nutritional support with by mouth intake.     Assessment & Plan:   Principal Problem:   Post-ERCP acute pancreatitis Active Problems:   Severe sepsis (HCC)   Polycythemia   AKI (acute kidney injury) ([facility])   Metabolic acidosis   Acute respiratory failure with hypoxia (HCC)   Bilateral pleural effusion   Ileus (HCC)   1. Acute post ERCP pancreatitis/ ileus. Now patient is tolerating full nutritional support by mouth, his abdominal pain has improved, no nausea or vomiting and positive bowel movements. Follow up abdominal film with biliary stent in place. Biliary drain down to 325 ml over last 24 H, down from 475 ml. Pending wbc this am.   Tolerating well antibiotic therapy with meropenem#9/14.   Continue pain control with acetaminophen/ ibuprofen scheduled, as needed oxycodone and hydromorphone.  Patient has been ambulating, physical/ occupational therapy now recommendign home health services at discharge.  Clinically improving, possible dc next week, depending on his progress.    2. Acute hypoxic respiratory failure. clinically resolved, continue oxymetry monitoring and incentive spirometer.  Out of bed and ambulate in the hallway.   3. AKIwith hypokalemia.Stable renal function with serum cr at 0.64 with K at 4,5 and serum bicarbonate at 23. Continue to hold on IV fluids.  Peripheral edema likely due to hypoproteinemia, will hold on diuretics for now.  4. Paroxysmal atrial fibrillation/ transitory.Converted to simus rhythm. Has a low risk of thrombosis CHADS vasc score of 1. Will hold on antithrombotic therapy for now.    Patient has remained on sinus rhythm, will discontinue telemetry monitoring.     6. Anemia of chronic disease. hgb and hct have been stable, cell count for this  am is pending  7. Acute metabolic encephalopathy/ depression and anxiety.Clinically has resolved. Continue with sertraline. Anxiety has improved.   8. HTN/ dyslipidemia. ( At home on lisinopril and hctz combination). Blood pressure has remained stable, will continue to hold on antihypertensive medications for now to prevent hypotension.     Continue with pravastatin.   11. Transitory hyperglycemia. Hgb A1c 5.6. fasting glucose is 134, insulin coverage has been discontinued with good toleration.    12. Moderate calorie protein malnutrition. On nutritional supplementation.    Patient continue to be at high risk for worsening pancreatitis.   Status is: Inpatient  Remains inpatient appropriate because:IV treatments appropriate due to intensity of illness or inability to take PO   Dispo:  Patient From: Home  Planned Disposition: [facility]  Expected discharge date: 05/15/20  Medically stable for discharge: No   DVT prophylaxis: Enoxaparin   Code Status:   full  Family Communication:  I spoke with patient's wife  at the bedside, we talked in detail about patient's condition, plan of care and prognosis and all questions were addressed.      Nutrition Status: Nutrition Problem: Inadequate oral intake Etiology: acute illness Signs/Symptoms: NPO status Interventions: Ensure Enlive (each supplement provides 350kcal and 20 grams of protein), Magic cup, MVI    Consultants:   GI   IR    Antimicrobials:    meropenem.    Subjective: Patient is feeling better, continue to be very weak and deconditioned, abdominal pain continue to improve with no nausea or vomiting   Objective: Vitals:   05/11/20 0837 05/11/20 1700 05/11/20 1942 05/12/20 0100  BP: 111/74 116/78 107/82 123/77  Pulse: 82 80 97 92  Resp: 18 17 18 16   Temp: 98.2 F (36.8 C) 98.7 F (37.1 C) 97.8 F (36.6  C) 98 F (36.7 C)  TempSrc: Oral Oral Oral Oral  SpO2: 99% 100% 98% 97%  Weight:      Height:        Intake/Output Summary (Last 24 hours) at 05/12/2020 0900 Last data filed at 05/12/2020 0500 Gross per 24 hour  Intake 1380 ml  Output 1175 ml  Net 205 ml   Filed Weights   05/04/20 0500 05/07/20 0500 05/08/20 0357  Weight: 82.9 kg 86.6 kg 86.9 kg    Examination:   General: Not in pain or dyspnea, deconditioned  Neurology: Awake and alert, non focal  E ENT: mild pallor, no icterus, oral mucosa moist Cardiovascular: No JVD. S1-S2 present, rhythmic, no gallops, rubs, or murmurs. ++ pitting bilateral lower extremity edema. Pulmonary: positive breath sounds bilaterally, adequate air movement, no wheezing, rhonchi or rales. Gastrointestinal. Abdomen distended but not tender Skin. No rashes Musculoskeletal: no joint deformities     Data Reviewed: I have personally reviewed following labs and imaging studies  CBC: Recent Labs  Lab 05/08/20 0245 05/09/20 0303 05/10/20 0426 05/11/20 0332 05/11/20 1712  WBC 19.5* 19.1* 18.3* 15.1* 15.5*  NEUTROABS 16.5* 15.8* 14.4* 12.2* 12.3*  HGB 10.5* 11.7* 11.6* 11.3* 11.7*  HCT 33.3* 37.3* 36.9* 35.4* 36.9*  MCV 89.3 91.0 91.1 88.1 88.7  PLT 366 479* 483* 449* 169*   Basic Metabolic Panel: Recent Labs  Lab 05/08/20 0245 05/09/20 0303 05/10/20 0426 05/11/20 0332 05/12/20 0334  NA 134* 135 132* 133* 134*  K 3.9 4.6 4.6 4.6 4.5  CL 103 103 102 103 102  CO2 22 24 23 23 23   GLUCOSE 188* 183* 186* 145* 134*  BUN 15 11 13 15  12  CREATININE 0.75 0.71 0.73 0.66 0.64  CALCIUM 7.7* 8.0* 8.1* 8.1* 8.4*  MG 2.0  --   --   --   --    GFR: Estimated Creatinine Clearance: 101.9 mL/min (by C-G formula based on SCr of 0.64 mg/dL). Liver Function Tests: Recent Labs  Lab 05/06/20 0146 05/07/20 0432 05/12/20 0334  AST 31 31 22   ALT 20 19 18   ALKPHOS 102 95 72  BILITOT 0.6 0.4 0.9  PROT 4.8* 4.7* 4.6*  ALBUMIN 1.6* 1.5* 1.7*   No  results for input(s): LIPASE, AMYLASE in the last 168 hours. No results for input(s): AMMONIA in the last 168 hours. Coagulation Profile: No results for input(s): INR, PROTIME in the last 168 hours. Cardiac Enzymes: No results for input(s): CKTOTAL, CKMB, CKMBINDEX, TROPONINI in the last 168 hours. BNP (last 3 results) No results for input(s): PROBNP in the last 8760 hours. HbA1C: No results for input(s): HGBA1C in the last 72 hours. CBG: Recent Labs  Lab 05/10/20 0400 05/10/20 0802 05/10/20 1214 05/11/20 0829 05/11/20 1415  GLUCAP 180* 166* 178* 152* 146*   Lipid Profile: No results for input(s): CHOL, HDL, LDLCALC, TRIG, CHOLHDL, LDLDIRECT in the last 72 hours. Thyroid Function Tests: No results for input(s): TSH, T4TOTAL, FREET4, T3FREE, THYROIDAB in the last 72 hours. Anemia Panel: No results for input(s): VITAMINB12, FOLATE, FERRITIN, TIBC, IRON, RETICCTPCT in the last 72 hours.    Radiology Studies: I have reviewed all of the imaging during this hospital visit personally     Scheduled Meds:  acetaminophen  650 mg Oral Q6H   acidophilus  2 capsule Oral Daily   calcium carbonate  1 tablet Oral TID WC   Chlorhexidine Gluconate Cloth  6 each Topical Daily   enoxaparin (LOVENOX) injection  40 mg Subcutaneous Q24H   feeding supplement (ENSURE ENLIVE)  237 mL Oral BID BM   ibuprofen  400 mg Oral TID   mouth rinse  15 mL Mouth Rinse BID   multivitamin with minerals  1 tablet Oral Daily   pantoprazole  40 mg Oral Daily   pravastatin  20 mg Oral q1800   sertraline  100 mg Oral Daily   sodium chloride flush  10-40 mL Intracatheter Q12H   zolpidem  5 mg Oral QHS   Continuous Infusions:  sodium chloride Stopped (05/07/20 0923)   meropenem (MERREM) IV 1 g (05/12/20 0316)     LOS: 15 days        Jonahtan Manseau Gerome Apley, MD

## 2020-05-12 NOTE — Progress Notes (Signed)
Referring Physician(s): Sandi Carne  Supervising Physician: Aletta Edouard  Patient Status:  [facility] - In-pt  Chief Complaint:  Patient expressing desire to be discharged.   Subjective:  64  y.o, male inpatient. History of biliary pancreatitis, gangrenous cholecystitis s/p lap chole on 5.18.21 with post surgical complication of an abscess formation to the gallbladder fossa. IR placed a drain to the gallbladder fossa abscess on 6.7.21. 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 On 6.24.21 patient had a an ERCP with CBD stent placement.  On 8.18.21 the CBD stent was removed and a metal stent was placed.  Due to high output and concern for stent migration the Team requested an abdominal xray from 9.3.21 shows the metal stent still in the region of the common bile duct.   The patient is ambulated the hall and states that he would like to be discharged today. Patient is also requesting upon discharge that the current gravity drainage back be changed to a not charged JP drain.  Allergies: Patient has no known allergies.  Medications: Prior to Admission medications   Medication Sig Start Date End Date Taking? Authorizing Provider  Calcium Carb-Cholecalciferol (CALCIUM+D3 PO) Take 1 tablet by mouth daily.   Yes [provider]  Coenzyme Q10 (COQ10) 400 MG CAPS Take 800 mg by mouth in the morning.    Yes [provider]  Cyanocobalamin (VITAMIN B-12) 5000 MCG TBDP Take 5,000 mcg by mouth daily.   Yes [provider]  GLUCOSAMINE-CHONDROITIN PO Take 1 tablet by mouth in the morning and at bedtime.    Yes [provider]  Javier Docker Oil 1000 MG CAPS Take 1,000 mg by mouth in the morning and at bedtime.    Yes [provider]  lisinopril-hydrochlorothiazide (ZESTORETIC) 10-12.5 MG tablet Take 1 tablet by mouth daily. 03/04/20  Yes [provider]  Moringa Oleifera (MORINGA PO) Take  1,000 mg by mouth 2 (two) times daily.   Yes [provider]  Multiple Vitamin (MULTIVITAMIN WITH MINERALS) TABS tablet Take 1 tablet by mouth daily.   Yes [provider]  ondansetron (ZOFRAN) 4 MG tablet Take 1 tablet (4 mg total) by mouth every 8 (eight) hours as needed for nausea or vomiting. 04/25/20  Yes Mansouraty, Telford Nab., MD  OVER THE COUNTER MEDICATION Take 1 tablet by mouth See admin instructions. [facility] otc tablet- Take 1 tablet by mouth once a day   Yes [provider]  oxyCODONE (OXY IR/ROXICODONE) 5 MG immediate release tablet Take 5 mg by mouth 2 (two) times daily as needed (for pain).  04/25/20  Yes [provider]  pravastatin (PRAVACHOL) 80 MG tablet Take 40 mg by mouth in the morning and at bedtime.   Yes [provider]  Red Yeast Rice 600 MG CAPS Take 1,200 mg by mouth at bedtime.    Yes [provider]  sertraline (ZOLOFT) 100 MG tablet Take 100 mg by mouth daily. 11/08/19  Yes [provider]  TURMERIC PO Take 1,500 mg by mouth 2 (two) times daily.   Yes [provider]  zolpidem (AMBIEN) 5 MG tablet Take 5 mg by mouth at bedtime. 12/29/19  Yes [provider]  aspirin EC 81 MG tablet Take 81 mg by mouth 2 (two) times daily. Patient not taking: Reported on 04/26/2020    [provider]  Calcium Carb-Cholecalciferol (CALCIUM 1000 + D PO) Take 1 tablet by mouth daily.  Patient not taking: Reported on 04/26/2020    [provider]  ibuprofen (ADVIL) 200 MG tablet Take 400-600 mg by mouth every 6 (six) hours as needed for headache or moderate pain.    [provider]     Vital Signs: BP 123/77 (BP Location: Right Arm)   Pulse 92   Temp 98 F (36.7 C) (Oral)   Resp 16   Ht 5\' 9"  (1.753 m)   Wt 191 lb 9.3 oz (86.9 kg)   SpO2 97%   BMI 28.29 kg/m   Physical Exam Vitals and nursing note reviewed.  Constitutional:      Appearance: He is well-developed.  HENT:       Head: Normocephalic.  Pulmonary:     Effort: Pulmonary effort is normal.  Abdominal:     Comments: Positive drain drain to gravity bag.  Approximately 20 ml of  Dark brown green colored fluid noted in gravity bag.    Musculoskeletal:        General: Normal range of motion.     Cervical back: Normal range of motion.  Skin:    General: Skin is dry.  Neurological:     Mental Status: He is alert and oriented to person, place, and time.     Imaging: DG Abd 1 View  Result Date: 05/11/2020 CLINICAL DATA:  Displacement of biliary stent. EXAM: ABDOMEN - 1 VIEW COMPARISON:  05/10/2020 FINDINGS: Biliary stent in the region the common bile duct unchanged. Gastric tube in the gastric antrum. Pigtail catheter overlying the liver unchanged. Normal bowel gas pattern. IMPRESSION: Biliary stent unchanged position in the in the region of the common bile duct. Feeding tube in the gastric antrum. Electronically Signed   By: Franchot Gallo M.D.   On: 05/11/2020 13:23   DG Abd 1 View  Result Date: 05/10/2020 CLINICAL DATA:  Abdominal distention.  Biliary stent. EXAM: ABDOMEN - 1 VIEW COMPARISON:  05/04/2020. FINDINGS: Feeding tube tip now noted at the level of the distal stomach/duodenal bulb. Right upper quadrant drainage catheter and biliary stent stable position. No bowel distention. No acute bony abnormality. IMPRESSION: Feeding tube tip now noted the level of the distal stomach/duodenal bulb. Right upper quadrant drainage catheter and biliary stent in stable position. No bowel distention. Electronically Signed   By: Marcello Moores  Register   On: 05/10/2020 06:34    Labs:  CBC: Recent Labs    05/09/20 0303 05/10/20 0426 05/11/20 0332 05/11/20 1712  WBC 19.1* 18.3* 15.1* 15.5*  HGB 11.7* 11.6* 11.3* 11.7*  HCT 37.3* 36.9* 35.4* 36.9*  PLT 479* 483* 449* 438*    COAGS: Recent Labs    02/02/20 1902 02/02/20 2115  INR 1.2 1.1    BMP: Recent Labs    05/09/20 0303 05/10/20 0426 05/11/20 0332  05/12/20 0334  NA 135 132* 133* 134*  K 4.6 4.6 4.6 4.5  CL 103 102 103 102  CO2 24 23 23 23   GLUCOSE 183* 186* 145* 134*  BUN 11 13 15 12   CALCIUM 8.0* 8.1* 8.1* 8.4*  CREATININE 0.71 0.73 0.66 0.64  GFRNONAA >60 >60 >60 >60  GFRAA >60 >60 >60 >60    LIVER FUNCTION TESTS: Recent Labs    05/05/20 0324 05/06/20 0146 05/07/20 0432 05/12/20 0334  BILITOT 0.8 0.6 0.4 0.9  AST 32 31 31 22   ALT 22 20 19 18   ALKPHOS 94 102 95 72  PROT 4.9* 4.8* 4.7* 4.6*  ALBUMIN 1.7* 1.6* 1.5* 1.7*    Assessment and  Plan:   64  y.o, male inpatient. History of biliary pancreatitis, gangrenous cholecystitis s/p lap chole on 5.18.21 with post surgical complication of an abscess formation to the gallbladder fossa. IR placed a drain to the gallbladder fossa abscess on 6.7.21. 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 On 6.24.21 patient had a an ERCP with CBD stent placement.  On 8.18.21 the CBD stent was removed and a metal stent was placed.  Due to high output and concern for stent migration the Team requested an abdominal xray from 9.3.21 shows the metal stent still in the region of the common bile duct.  Per epic output is:  300 ml, 325 ml, 475 ml.   WBC is 15.5 Sodium 134. All other labs and medications are within acceptable parameters.  Patient is afebrile. Gall blader fossa drain noted to have  approximately 20 ml of dark brown - green op. Drain is to gravity bag.   Recommend team not flush at this time. Output recording q shift and dressing changes as needed.     IR will continue to follow along - plans per TRH/CCS/GI.   Electronically Signed: Jacqualine Mau, NP 05/12/2020, 2:16 PM   I spent a total of 15 Minutes at the the patient's bedside AND on the patient's hospital floor or unit, greater than 50% of which was counseling/coordinating care for gallbladder fossa drain.

## 2020-05-13 ENCOUNTER — Inpatient Hospital Stay (HOSPITAL_COMMUNITY): Payer: 59

## 2020-05-13 DIAGNOSIS — D45 Polycythemia vera: Secondary | ICD-10-CM

## 2020-05-13 DIAGNOSIS — G9341 Metabolic encephalopathy: Secondary | ICD-10-CM

## 2020-05-13 DIAGNOSIS — F419 Anxiety disorder, unspecified: Secondary | ICD-10-CM

## 2020-05-13 DIAGNOSIS — R739 Hyperglycemia, unspecified: Secondary | ICD-10-CM

## 2020-05-13 DIAGNOSIS — I10 Essential (primary) hypertension: Secondary | ICD-10-CM

## 2020-05-13 DIAGNOSIS — I480 Paroxysmal atrial fibrillation: Secondary | ICD-10-CM

## 2020-05-13 DIAGNOSIS — E785 Hyperlipidemia, unspecified: Secondary | ICD-10-CM

## 2020-05-13 DIAGNOSIS — F329 Major depressive disorder, single episode, unspecified: Secondary | ICD-10-CM

## 2020-05-13 LAB — GLUCOSE, CAPILLARY
Glucose-Capillary: 113 mg/dL — ABNORMAL HIGH (ref 70–99)
Glucose-Capillary: 111 mg/dL — ABNORMAL HIGH (ref 70–99)
Glucose-Capillary: 103 mg/dL — ABNORMAL HIGH (ref 70–99)
Glucose-Capillary: 127 mg/dL — ABNORMAL HIGH (ref 70–99)

## 2020-05-13 MED ORDER — PROMETHAZINE HCL 25 MG/ML IJ SOLN
12.5000 mg | INTRAMUSCULAR | Status: DC | PRN
  Administered 2020-05-14 – 2020-06-09 (×8): 12.5 mg via INTRAVENOUS
  Filled 2020-05-13 (×9): qty 1

## 2020-05-13 MED ORDER — IOHEXOL 300 MG/ML  SOLN
100.0000 mL | Freq: Once | INTRAMUSCULAR | Status: AC | PRN
  Administered 2020-05-13: 100 mL via INTRAVENOUS

## 2020-05-13 MED ORDER — IOHEXOL 9 MG/ML PO SOLN
500.0000 mL | ORAL | Status: AC
  Administered 2020-05-13 (×2): 500 mL via ORAL

## 2020-05-13 NOTE — Plan of Care (Signed)
  Problem: Education: Goal: Knowledge of General Education information will improve Description: Including pain rating scale, medication(s)/side effects and non-pharmacologic comfort measures Outcome: Progressing   Problem: Health Behavior/Discharge Planning: Goal: Ability to manage health-related needs will improve Outcome: Progressing   Problem: Clinical Measurements: Goal: Ability to maintain clinical measurements within normal limits will improve Outcome: Progressing Goal: Will remain free from infection Outcome: Progressing Goal: Diagnostic test results will improve Outcome: Progressing Goal: Respiratory complications will improve Outcome: Progressing Goal: Cardiovascular complication will be avoided Outcome: Progressing   Problem: Activity: Goal: Risk for activity intolerance will decrease Outcome: Progressing   Problem: Nutrition: Goal: Adequate nutrition will be maintained Outcome: Progressing   Problem: Coping: Goal: Level of anxiety will decrease Outcome: Progressing   Problem: Elimination: Goal: Will not experience complications related to bowel motility Outcome: Progressing Goal: Will not experience complications related to urinary retention Outcome: Progressing   Problem: Pain Managment: Goal: General experience of comfort will improve Outcome: Progressing   Problem: Safety: Goal: Ability to remain free from injury will improve Outcome: Progressing   Problem: Skin Integrity: Goal: Risk for impaired skin integrity will decrease Outcome: Progressing   Problem: Education: Goal: Knowledge of disease or condition will improve Outcome: Progressing Goal: Understanding of medication regimen will improve Outcome: Progressing Goal: Individualized Educational Video(s) Outcome: Progressing   Problem: Activity: Goal: Ability to tolerate increased activity will improve Outcome: Progressing   Problem: Cardiac: Goal: Ability to achieve and maintain  adequate cardiopulmonary perfusion will improve Outcome: Progressing   Problem: Health Behavior/Discharge Planning: Goal: Ability to safely manage health-related needs after discharge will improve Outcome: Progressing   Problem: Activity: Goal: Ability to tolerate increased activity will improve Outcome: Progressing   Problem: Respiratory: Goal: Ability to maintain a clear airway and adequate ventilation will improve Outcome: Progressing   Problem: Role Relationship: Goal: Method of communication will improve Outcome: Progressing

## 2020-05-13 NOTE — Progress Notes (Signed)
Patient ID: Adam Fowler, male   DOB: [birth year], 65 y.o.   MRN: [id number]  PROGRESS NOTE    Efraim Vanallen  BWI:[id number] DOB: [birth year] DOA: 04/26/2020 PCP: Melony Overly, MD    Brief Narrative:  64 year old male with past medical history significant for hypertension. Heunderwent ERCP on 8/18 and biliary stent exchange for persistent postoperative bile leak,related to recent cholecystectomy with abscess status post percutaneous drainage. Patient developed severe epigastric abdominal pain, associated with nausea, and vomiting after ERCP procedure.   CT of the abdomen pelvis show changes consistent with acute pancreatitis, no abscess or drainable fluid collection. Patient was admitted to the medical ward, he received supportive medical therapy including intravenous fluids, analgesics and IV antibiotics.   His hospitalization has been complicated by metabolic encephalopathy,acute hypoxicrespiratory failure, renal failure, new onset paroxysmal atrial fibrillation and acute ileus.  08/21-08/22 rapid response due to increase work of breathing and atrial fibrillation, required invasive mechanical ventilation until 8/24. He was treated for volume overload and aspiration pneumonia. Received diuresis and antibiotic therapy with Zosyn.   His antibiotic therapy was switched from Zosyn to meropenem on 8/27. Hewasplaced on tube feeds via NG tube for several days until 9/30.Marland Kitchen    Assessment & Plan:   Principal Problem:   Post-ERCP acute pancreatitis Active Problems:   Severe sepsis (HCC)   Polycythemia   AKI (acute kidney injury) ([facility])   Metabolic acidosis   Acute respiratory failure with hypoxia (HCC)   Bilateral pleural effusion   Ileus (HCC)  1. Acute post ERCP pancreatitis/ ileus. Now patient is tolerating full nutritional support by mouth, his abdominal pain has improved, no nausea or vomiting and positive bowel movements. Abdominal film suggests that stent is in place. Still  with fairly high output from the drain.  Tolerating well antibiotic therapy withmeropenem#10 /14.   Continue pain control   Patient has been ambulating, physical/ occupational therapy now recommending home health services at discharge.   Clinically improving, possible dc next week, depending on his progress.   2. Acute hypoxic respiratory failure.clinically resolved, continue oxymetry monitoring and incentive spirometer.  Out of bed and ambulate in the hallway.   3. AKIwith hypokalemia.Stable renal function with serum cr at 0.64   4. Paroxysmal atrial fibrillation/ transitory.Converted to simus rhythm. Has a low risk of thrombosis CHADS vasc score of 1. Will hold on antithrombotic therapy for now.    Patient has remained on sinus rhythm  6. Anemiaof chronic disease. hgb and hct have been stable, cell count for this am is pending  7. Acute metabolic encephalopathy/ depression and anxiety.Clinically has resolved. Continue with sertraline. Anxiety has improved.   8. HTN/ dyslipidemia.( [place] on lisinopril and hctz combination). Blood pressure has remained stable, will continue to hold on antihypertensive medications for now to prevent hypotension.    Continue with pravastatin.   11. Transitory hyperglycemia. Hgb A1c 5.6. fasting glucose is 134, insulin coverage has been discontinued with good toleration.    12. Moderate calorie protein malnutrition. On nutritional supplementation.   DVT prophylaxis: Lovenox SQ Code Status: Full code  Family Communication: Patient with wife at bedside. Disposition Plan: Unclear Remains candidate for inpatient due to ongoing IV antibiotics, IV fluid, continued work-up.  Consultants:   GI  IR  Procedures:  Echo  ERCP  Core tract 8/27  Intubation 8/22-8/24  Antimicrobials: Anti-infectives (From admission, onward)   Start     Dose/Rate Route Frequency Ordered Stop   05/04/20 1230  meropenem (MERREM) 1 g  in sodium chloride  0.9 % 100 mL IVPB        1 g 200 mL/hr over 30 Minutes Intravenous Every 8 hours 05/04/20 1156     05/01/20 1400  piperacillin-tazobactam (ZOSYN) IVPB 3.375 g        3.375 g 12.5 mL/hr over 240 Minutes Intravenous Every 8 hours 05/01/20 1013 05/03/20 0907   04/29/20 2000  vancomycin (VANCOCIN) IVPB 1000 mg/200 mL premix  Status:  Discontinued        1,000 mg 200 mL/hr over 60 Minutes Intravenous Every 12 hours 04/29/20 0653 04/30/20 0831   04/29/20 0745  vancomycin (VANCOREADY) IVPB 1500 mg/300 mL        1,500 mg 150 mL/hr over 120 Minutes Intravenous  Once 04/29/20 0653 04/29/20 1129   04/26/20 2300  piperacillin-tazobactam (ZOSYN) IVPB 3.375 g        3.375 g 12.5 mL/hr over 240 Minutes Intravenous Every 8 hours 04/26/20 1716 05/01/20 0559   04/26/20 1700  piperacillin-tazobactam (ZOSYN) IVPB 3.375 g        3.375 g 100 mL/hr over 30 Minutes Intravenous  Once 04/26/20 1655 04/26/20 1844       Subjective: Continues to have some right upper quadrant discomfort. Although he is tolerating p.o. fairly well. He is frustrated by the ongoing work-up and failure to identify and or definitively treat the cause of his bile leak.  Objective: Vitals:   05/12/20 1947 05/13/20 0306 05/13/20 0517 05/13/20 1530  BP: 125/78  130/85 132/76  Pulse: 83  78 77  Resp:    18  Temp: 97.6 F (36.4 C)  97.9 F (36.6 C) 98.2 F (36.8 C)  TempSrc: Oral  Oral Oral  SpO2: 98%  96% 98%  Weight:  86 kg    Height:        Intake/Output Summary (Last 24 hours) at 05/13/2020 1650 Last data filed at 05/13/2020 1554 Gross per 24 hour  Intake 840 ml  Output 1750 ml  Net -910 ml   Filed Weights   05/07/20 0500 05/08/20 0357 05/13/20 0306  Weight: 86.6 kg 86.9 kg 86 kg    Examination:  General exam: Appears calm and comfortable  Respiratory system: Clear to auscultation. Respiratory effort normal. Cardiovascular system: S1 & S2 heard, RRR.  Gastrointestinal system: Abdomen is  nondistended, soft and mildly tender in the upper abdomen.  Central nervous system: Alert and oriented. No focal neurological deficits. Extremities: Symmetric, minimal edema Skin: No rashes Psychiatry: Judgement and insight appear normal. Mood & affect appropriate.     Data Reviewed: I have personally reviewed following labs and imaging studies  CBC: Recent Labs  Lab 05/08/20 0245 05/09/20 0303 05/10/20 0426 05/11/20 0332 05/11/20 1712  WBC 19.5* 19.1* 18.3* 15.1* 15.5*  NEUTROABS 16.5* 15.8* 14.4* 12.2* 12.3*  HGB 10.5* 11.7* 11.6* 11.3* 11.7*  HCT 33.3* 37.3* 36.9* 35.4* 36.9*  MCV 89.3 91.0 91.1 88.1 88.7  PLT 366 479* 483* 449* 948*   Basic Metabolic Panel: Recent Labs  Lab 05/08/20 0245 05/09/20 0303 05/10/20 0426 05/11/20 0332 05/12/20 0334  NA 134* 135 132* 133* 134*  K 3.9 4.6 4.6 4.6 4.5  CL 103 103 102 103 102  CO2 22 24 23 23 23   GLUCOSE 188* 183* 186* 145* 134*  BUN 15 11 13 15 12   CREATININE 0.75 0.71 0.73 0.66 0.64  CALCIUM 7.7* 8.0* 8.1* 8.1* 8.4*  MG 2.0  --   --   --   --    GFR: Estimated Creatinine Clearance: 101.3 mL/min (  by C-G formula based on SCr of 0.64 mg/dL). Liver Function Tests: Recent Labs  Lab 05/07/20 0432 05/12/20 0334  AST 31 22  ALT 19 18  ALKPHOS 95 72  BILITOT 0.4 0.9  PROT 4.7* 4.6*  ALBUMIN 1.5* 1.7*   CBG: Recent Labs  Lab 05/11/20 1415 05/12/20 2023 05/13/20 0648 05/13/20 1208 05/13/20 1617  GLUCAP 146* 117* 113* 111* 103*      Radiology Studies: CT ABDOMEN PELVIS W CONTRAST  Result Date: 05/13/2020 CLINICAL DATA:  History of cholecystectomy complicated by development of a postoperative bile leak from the cystic duct remnant, post image guided placement of a percutaneous drainage catheter into the gallbladder fossa fluid collection on 02/13/2020 (Dr. Annamaria Boots). Patient subsequently underwent placement of an internal biliary stent at 51/88/4166 which was complicated by development of postprocedural  pancreatitis. Evaluate for bile leak/biloma. EXAM: CT ABDOMEN AND PELVIS WITH CONTRAST TECHNIQUE: Multidetector CT imaging of the abdomen and pelvis was performed using the standard protocol following bolus administration of intravenous contrast. CONTRAST:  133mL OMNIPAQUE IOHEXOL 300 MG/ML  SOLN COMPARISON:  CT abdomen pelvis-05/03/2020; 04/28/2020; 04/26/2020; 03/01/2020; 02/13/2020; image guided placement of gallbladder fossa drainage catheter-02/13/2020; drainage catheter injection-03/01/2020; ERCP-03/21/2020 FINDINGS: Lower chest: Limited visualization of lower thorax demonstrates small to moderate-sized left and trace right-sided pleural effusions with associated bibasilar consolidative opacities and associated air bronchograms and volume loss within the bilateral lower lobes, left greater than right, similar to the 8/26 examination. Normal heart size.  No pericardial effusion. Hepatobiliary: Normal hepatic contour. No discrete hepatic lesions. Stable positioning of percutaneous drainage catheter with end coiled and locked within the gallbladder fossa. Stable positioning of internal biliary stent without development of intrahepatic biliary ductal dilatation. Interval development of small amount of perihepatic ascites which extends to the level of the lower abdomen/pelvis. Pancreas: Redemonstrated sequela of pancreatitis with extensive inflammatory change about the pancreatic bed. There is persistent enhancement of the pancreatic parenchyma without definitive evidence of pancreatic necrosis. No definitive pancreatic ductal dilatation on this non pancreatic protocol CT scan. Inflammatory change again extends along the anterior aspect of the left pararenal space without peripherally enhancing definable/drainable pancreatic pseudocyst. Spleen: Normal appearance of the spleen. Adrenals/Urinary Tract: There is symmetric enhancement and excretion of the bilateral kidneys. Redemonstrated punctate bilateral  nonobstructing renal stones with dominant stones within the superior poles of the bilateral kidneys measuring 0.4 cm in diameter (left-coronal image 86, series 6; right-coronal image 88, series 6). No evidence of urinary obstruction or perinephric stranding. Normal appearance the bilateral adrenal glands. Normal appearance of the urinary bladder given degree of distention. Stomach/Bowel: Moderate colonic stool burden without evidence of enteric obstruction. Scattered colonic diverticulosis without evidence of superimposed acute diverticulitis. Normal appearance of the terminal ileum and the retrocecal appendix. Mild bowel wall thickening involving the transverse colon is likely reactive secondary to adjacent inflammatory change within the pancreas. No pneumoperitoneum, pneumatosis or portal venous gas. Vascular/Lymphatic: Scattered atherosclerotic plaque. The major branch vessels of the abdominal aorta appear patent within normal caliber abdominal aorta on this non CTA examination. The peripheral aspect of the portal vein as well as the portal splenule confluence and superior aspect of the SMV and splenic vein are narrowed though remains patent. Scattered porta hepatis, gastrohepatic ligament and mesenteric lymph nodes are prominent though individually not enlarged by size criteria, presumably reactive in etiology. Reproductive: Normal appearance of the prostate gland. Small moderate amount of fluid within the lower pelvis. Other: Diffuse body wall anasarca, most conspicuous about the midline of the low back.  Musculoskeletal: No acute or aggressive osseous abnormalities. Stigmata of dish within the thoracic spine. Mild-to-moderate degenerative change the bilateral hips with joint space loss, subchondral sclerosis and osteophytosis. IMPRESSION: 1. Sequela of severe pancreatitis with extensive fluid about the pancreatic bed but without peripherally enhancing definable/drainable pancreatic pseudocyst and without  definitive evidence of pancreatic necrosis. 2. Stable positioning of internal biliary stent with apparent preserved functionality as there is no intrahepatic biliary ductal dilatation. 3. Interval development of small to moderate volume intra-abdominal ascites, the etiology of which is not determined on this examination though given extensive pancreatitis and apparent functionality of the internal biliary stent, fluid may represent pancreatic enzymes. Fluid sampling with sending aspirated fluid for amylase, lipase and bilirubin levels could be performed as indicated. Alternatively, if there is concern for a bile leak, further evaluation with nuclear medicine HIDA scan could be performed as indicated. 4. Stable positioning of gallbladder fossa percutaneous drainage catheter. 5. Grossly unchanged small to moderate sized left and trace right-sided pleural effusions with associated bibasilar opacities/atelectasis. 6. Bilateral nonobstructing nephrolithiasis. Electronically Signed   By: Sandi Mariscal M.D.   On: 05/13/2020 15:41     Scheduled Meds: . acetaminophen  650 mg Oral Q6H  . acidophilus  2 capsule Oral Daily  . calcium carbonate  1 tablet Oral TID WC  . Chlorhexidine Gluconate Cloth  6 each Topical Daily  . enoxaparin (LOVENOX) injection  40 mg Subcutaneous Q24H  . feeding supplement (ENSURE ENLIVE)  237 mL Oral BID BM  . ibuprofen  400 mg Oral TID  . mouth rinse  15 mL Mouth Rinse BID  . multivitamin with minerals  1 tablet Oral Daily  . pantoprazole  40 mg Oral Daily  . pravastatin  20 mg Oral q1800  . sertraline  100 mg Oral Daily  . sodium chloride flush  10-40 mL Intracatheter Q12H  . zolpidem  5 mg Oral QHS   Continuous Infusions: . sodium chloride Stopped (05/07/20 0923)  . meropenem (MERREM) IV 1 g (05/13/20 1025)     LOS: 16 days    Donnamae Jude, MD 05/13/2020 4:50 PM [phone] [facility] If 7PM-7AM, please contact night-coverage 05/13/2020, 4:50 PM

## 2020-05-13 NOTE — Progress Notes (Addendum)
Patient ID: Adam Fowler, male   DOB: Jun 09, 1956, 64 y.o.   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  No significant abdominal pain, diet has been advanced and he was able to tolerate. Abdomen feels full, and he also has some mild lower extremity edema he is asking about Lasix.    Objective   Vital signs in last 24 hours: Temp:  [97.5 F (36.4 C)-97.9 F (36.6 C)] 97.9 F (36.6 C) (09/05 0517) Pulse Rate:  [77-83] 78 (09/05 0517) Resp:  [17] 17 (09/04 1416) BP: (116-130)/(72-85) 130/85 (09/05 0517) SpO2:  [96 %-98 %] 96 % (09/05 0517) Weight:  [86 kg] 86 kg (09/05 0306) Last BM Date: 05/11/20 General:    White male in NAD Heart:  Regular rate and rhythm; no murmurs Lungs: Respirations even and unlabored, lungs CTA bilaterally Abdomen: Full feeling, mild tenderness across the upper abdomen nondistended. Normal bowel sounds.  Biliary drain with dark greenish bilious effluent Extremities:  Without edema. Neurologic:  Alert and oriented,  grossly normal neurologically. Psych:  Cooperative. Normal mood and affect.  Intake/Output from previous day: 09/04 0701 - 09/05 0700 In: 120 [P.O.:120] Out: 1450 [Urine:750; Drains:700] Intake/Output this shift: No intake/output data recorded.  Lab Results: Recent Labs    05/11/20 0332 05/11/20 1712  WBC 15.1* 15.5*  HGB 11.3* 11.7*  HCT 35.4* 36.9*  PLT 449* 438*   BMET Recent Labs    05/11/20 0332 05/12/20 0334  NA 133* 134*  K 4.6 4.5  CL 103 102  CO2 23 23  GLUCOSE 145* 134*  BUN 15 12  CREATININE 0.66 0.64  CALCIUM 8.1* 8.4*   LFT Recent Labs    05/12/20 0334  PROT 4.6*  ALBUMIN 1.7*  AST 22  ALT 18  ALKPHOS 72  BILITOT 0.9    Studies/Results: DG Abd 1  View  Result Date: 05/11/2020 CLINICAL DATA:  Displacement of biliary stent. EXAM: ABDOMEN - 1 VIEW COMPARISON:  05/10/2020 FINDINGS: Biliary stent in the region the common bile duct unchanged. Gastric tube in the gastric antrum. Pigtail catheter overlying the liver unchanged. Normal bowel gas pattern. IMPRESSION: Biliary stent unchanged position in the in the region of the common bile duct. Feeding tube in the gastric antrum. Electronically Signed   By: Franchot Gallo M.D.   On: 05/11/2020 13:23     Assessment / Plan:    #60 64 year old white male with post ERCP pancreatitis resolving.  Now able to take solid diet.  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  CBD stent had been placed.  Plain films yesterday showed stent to appear to be in position.  He has had an increase in drainage from the percutaneous drain in the gallbladder fossa over the past few days raising question of stent migration.  Plan:  #1 Continue meropenem x10 to 14 days  #2 Repeat CT of the abdomen and pelvis today to reassess for biloma/stent.    LOS: 16 days   Amy EsterwoodPA-C  05/13/2020, 8:40 AM     Attending Physician Note   I have taken an interval history, reviewed the chart and examined the patient. I agree  with the Advanced Practitioner's note, impression and recommendations.   Lucio Edward, MD [facility]

## 2020-05-14 ENCOUNTER — Encounter (HOSPITAL_COMMUNITY): Payer: Self-pay | Admitting: Internal Medicine

## 2020-05-14 LAB — GLUCOSE, CAPILLARY
Glucose-Capillary: 122 mg/dL — ABNORMAL HIGH (ref 70–99)
Glucose-Capillary: 119 mg/dL — ABNORMAL HIGH (ref 70–99)
Glucose-Capillary: 117 mg/dL — ABNORMAL HIGH (ref 70–99)
Glucose-Capillary: 124 mg/dL — ABNORMAL HIGH (ref 70–99)

## 2020-05-14 MED ORDER — POLYETHYLENE GLYCOL 3350 17 G PO PACK
17.0000 g | PACK | Freq: Every day | ORAL | Status: DC
  Administered 2020-05-16 – 2020-05-31 (×12): 17 g via ORAL
  Filled 2020-05-14 (×13): qty 1

## 2020-05-14 MED ORDER — POLYETHYLENE GLYCOL 3350 17 G PO PACK
17.0000 g | PACK | Freq: Two times a day (BID) | ORAL | Status: AC
  Administered 2020-05-14 – 2020-05-15 (×4): 17 g via ORAL
  Filled 2020-05-14 (×3): qty 1

## 2020-05-14 NOTE — Progress Notes (Signed)
Physical Therapy Treatment Patient Details Name: Adam Fowler MRN: [id number] DOB: February 25, 1956 Today's Date: 05/14/2020    History of Present Illness Pt is a 64 yo male who presents to the ED with severe abdominal pain, nausea and vomiting. Found to have post ERCP acute pancreatits. Pt developed Hypoxic resp failure and was Intubated 8/22  and extubated 8/24. Pt also with new onset a fib and acute ileus. PMH includes HTN and ERCP.     PT Comments    The pt is making good progress with therapy goals at this time. He was able to demonstrate improved safety and reduced instances of manual assist needed to regain his balance with use of a SPC during gait. The pt remains most limited by endurance and stability, as he is unsteady on his feet, even statically, without UE support. The pt's SpO2 remained stable >92% with all mobility on RA, but his HR increased to 120-138bpm with ambulation distances of 32ft to 67ft. The pt will continue to benefit from skilled PT to further progress standing stability statically and dynamically, as well as progressing general strength and endurance to facilitate safe return home with reduced risk of falls.  The patient ambulates with a gait speed of 0.22m/s using SPC and with minG. A gait speed less than 1.21m/s indicates increased risk of falls (less than 0.8m/s indicates increased risk of falls and dependence in ADLs).    Follow Up Recommendations  Home health PT;Supervision for mobility/OOB     Equipment Recommendations  Rolling walker with 5" wheels;3in1 (PT)    Recommendations for Other Services       Precautions / Restrictions Precautions Precautions: Fall Precaution Comments: abdominal drain, watch HR Restrictions Weight Bearing Restrictions: No    Mobility  Bed Mobility Overal bed mobility: Needs Assistance Bed Mobility: Supine to Sit;Sit to Supine     Supine to sit: Min guard;HOB elevated     General bed mobility comments: pt able to rise  without physical assist, does use extra time and bed rails, elevated HOB  Transfers Overall transfer level: Needs assistance Equipment used: 1 person hand held assist;Straight cane Transfers: Sit to/from Stand Sit to Stand: Min assist;Min guard         General transfer comment: minA through HHA to steady from various surfaces, or minG with use of straight cane to steady once in standing  Ambulation/Gait Ambulation/Gait assistance: Min assist Gait Distance (Feet): 75 Feet (x2 + 20 ft) Assistive device: Straight cane Gait Pattern/deviations: Step-through pattern;Staggering left;Staggering right;Decreased stride length Gait velocity: 0.44 m/s Gait velocity interpretation: <1.8 ft/sec, indicate of risk for recurrent falls General Gait Details: pt with improved stability with use of cane, still had multiple episodes of drifting or taking small extra steps due to LOB, but only had 2 instances of LOB requiring manual assist to recover. Pt HR increased to 138 bpm with activity, recovers to 100 bpm with seated rest.       Balance Overall balance assessment: Needs assistance Sitting-balance support: Feet supported;No upper extremity supported Sitting balance-Leahy Scale: Good Sitting balance - Comments: no LOB with donning socks   Standing balance support: Bilateral upper extremity supported;Single extremity supported;During functional activity Standing balance-Leahy Scale: Fair Standing balance comment: significant postural sway with static stance and no UE support, single UE for gait               High Level Balance Comments: unable to achieve or maintain tandem stance without significant assist. tandem walk with modA and SPC 10 ft  x3 with multiple LOB            Cognition Arousal/Alertness: Awake/alert Behavior During Therapy: Impulsive Overall Cognitive Status: Impaired/Different from baseline Area of Impairment: Attention;Memory;Following  commands;Safety/judgement;Problem solving                   Current Attention Level: Sustained Memory: Decreased short-term memory Following Commands: Follows one step commands with increased time Safety/Judgement: Decreased awareness of safety;Decreased awareness of deficits   Problem Solving: Difficulty sequencing;Slow processing;Requires verbal cues;Requires tactile cues General Comments: benefits from specific instructions and cues for sequencing/safety. impusive with mobility and decreased awareness of fall risk/safe movements      Exercises      General Comments General comments (skin integrity, edema, etc.): SpO2 maintained > 92% with all mobility, HR 100-110 at rest, 120-138bpm with gait      Pertinent Vitals/Pain Pain Assessment: No/denies pain Faces Pain Scale: Hurts a little bit Pain Location: abdomen during gait Pain Descriptors / Indicators: Grimacing;Guarding Pain Intervention(s): Monitored during session;Limited activity within patient's tolerance           PT Goals (current goals can now be found in the care plan section) Acute Rehab PT Goals Patient Stated Goal: to walk PT Goal Formulation: With patient Time For Goal Achievement: 05/16/20 Potential to Achieve Goals: Good Progress towards PT goals: Progressing toward goals    Frequency    Min 3X/week      PT Plan Current plan remains appropriate       AM-PAC PT "6 Clicks" Mobility   Outcome Measure  Help needed turning from your back to your side while in a flat bed without using bedrails?: None Help needed moving from lying on your back to sitting on the side of a flat bed without using bedrails?: None Help needed moving to and from a bed to a chair (including a wheelchair)?: A Little Help needed standing up from a chair using your arms (e.g., wheelchair or bedside chair)?: None Help needed to walk in hospital room?: A Little Help needed climbing 3-5 steps with a railing? : A Little 6  Click Score: 21    End of Session Equipment Utilized During Treatment: Gait belt Activity Tolerance: Patient tolerated treatment well Patient left: with call bell/phone within reach;in chair Nurse Communication: Mobility status PT Visit Diagnosis: Unsteadiness on feet (R26.81);Muscle weakness (generalized) (M62.81);Difficulty in walking, not elsewhere classified (R26.2)     Time: 2229-7989 PT Time Calculation (min) (ACUTE ONLY): 48 min  Charges:  $Gait Training: 23-37 mins $Therapeutic Activity: 8-22 mins                     Karma Ganja, PT, DPT   Acute Rehabilitation Department Pager #: [phone]   Otho Bellows 05/14/2020, 11:27 AM

## 2020-05-14 NOTE — Progress Notes (Signed)
Occupational Therapy Treatment Patient Details Name: Adam Fowler MRN: [id number] DOB: 24-Jun-1956 Today's Date: 05/14/2020    History of present illness Pt is a 64 yo male who presents to the ED with severe abdominal pain, nausea and vomiting. Found to have post ERCP acute pancreatits. Pt developed Hypoxic resp failure and was Intubated 8/22  and extubated 8/24. Pt also with new onset a fib and acute ileus. PMH includes HTN and ERCP.    OT comments  Pt presents seated EOB pleasant and agreeable to OT session. Pt continues to require up to minA for functional transfers given impulsivity and intermittent imbalance with mobility tasks without UE support. Pt completing LB ADL at supervision level. Administered Short Blessed Test with pt receiving score of 8 (score of 5-9 indicates questionable impairment). HR up to the upper 120s with room level activity. Have updated d/c recommendations given pt/pt's spouse declining SNF. Will continue to follow while pt remains acutely admitted.   Follow Up Recommendations  Home health OT;Supervision/Assistance - 24 hour (pt/spouse refusing SNF)    Equipment Recommendations  3 in 1 bedside commode          Precautions / Restrictions Precautions Precautions: Fall Precaution Comments: abdominal drain, watch HR Restrictions Weight Bearing Restrictions: No       Mobility Bed Mobility               General bed mobility comments: seated EOB upon arrival   Transfers Overall transfer level: Needs assistance Equipment used: 1 person hand held assist;Straight cane Transfers: Sit to/from Stand Sit to Stand: Min guard         General transfer comment: multiple sit<>stands during session     Balance Overall balance assessment: Needs assistance Sitting-balance support: Feet supported;No upper extremity supported Sitting balance-Leahy Scale: Good     Standing balance support: Bilateral upper extremity supported;Single extremity supported;During  functional activity Standing balance-Leahy Scale: Fair                             ADL either performed or assessed with clinical judgement   ADL Overall ADL's : Needs assistance/impaired                     Lower Body Dressing: Supervision/safety;Sit to/from stand Lower Body Dressing Details (indicate cue type and reason): donning socks and underwear, pt adamant to don underwear without assist despite imbalances in standing              Functional mobility during ADLs: Min guard;Minimal assistance       Vision       Perception     Praxis      Cognition Arousal/Alertness: Awake/alert Behavior During Therapy: Impulsive Overall Cognitive Status: Impaired/Different from baseline Area of Impairment: Attention;Memory;Following commands;Safety/judgement;Problem solving                   Current Attention Level: Sustained Memory: Decreased short-term memory Following Commands: Follows one step commands with increased time Safety/Judgement: Decreased awareness of safety;Decreased awareness of deficits   Problem Solving: Difficulty sequencing;Slow processing;Requires verbal cues;Requires tactile cues General Comments: benefits from specific instructions and cues for sequencing/safety. impusive with mobility and decreased awareness of fall risk/safe movements. administered Short Blessed Test and pt scoring 8 (indicative of questionable impairment)        Exercises Exercises: General Lower Extremity;Other exercises General Exercises - Lower Extremity Long Arc Quad: AROM;Both;10 reps Hip Flexion/Marching: AROM;Both;10 reps Other Exercises Other  Exercises: sit<>stand x5, x3 sets   Shoulder Instructions       General Comments      Pertinent Vitals/ Pain       Pain Assessment: No/denies pain  Home Living                                          Prior Functioning/Environment              Frequency  Min 2X/week         Progress Toward Goals  OT Goals(current goals can now be found in the care plan section)  Progress towards OT goals: Progressing toward goals  Acute Rehab OT Goals Patient Stated Goal: to walk OT Goal Formulation: With patient Time For Goal Achievement: 05/17/20 Potential to Achieve Goals: Good ADL Goals Pt Will Perform Grooming: with supervision;standing Pt Will Perform Lower Body Bathing: with supervision;sit to/from stand Pt Will Perform Lower Body Dressing: with supervision;sit to/from stand Pt Will Transfer to Toilet: with supervision;ambulating;bedside commode Pt Will Perform Toileting - Clothing Manipulation and hygiene: with supervision;sit to/from stand Additional ADL Goal #1: Pt will perform bed mobility modified independently.  Plan Discharge plan needs to be updated    Co-evaluation                 AM-PAC OT "6 Clicks" Daily Activity     Outcome Measure   Help from another person eating meals?: None Help from another person taking care of personal grooming?: A Little Help from another person toileting, which includes using toliet, bedpan, or urinal?: A Little Help from another person bathing (including washing, rinsing, drying)?: A Little Help from another person to put on and taking off regular upper body clothing?: A Little Help from another person to put on and taking off regular lower body clothing?: A Little 6 Click Score: 19    End of Session Equipment Utilized During Treatment: Gait belt  OT Visit Diagnosis: Unsteadiness on feet (R26.81);Other abnormalities of gait and mobility (R26.89);Muscle weakness (generalized) (M62.81);Other symptoms and signs involving cognitive function   Activity Tolerance Patient tolerated treatment well   Patient Left in chair;with call bell/phone within reach   Nurse Communication Mobility status        Time: 4628-6381 OT Time Calculation (min): 26 min  Charges: OT General Charges $OT Visit: 1  Visit OT Treatments $Self Care/Home Management : 8-22 mins $Therapeutic Activity: 8-22 mins  Lou Cal, OT Acute Rehabilitation Services Pager [phone] Office [phone]   Raymondo Band 05/14/2020, 5:07 PM

## 2020-05-14 NOTE — Progress Notes (Signed)
Patient known to our service with bile leak s/p subtotal cholecystectomy. Contacted by Dr. Kennon Rounds about ongoing drainage- 350cc last 24h- despite appropriately positioned covered metal stent. Pt also with pancreatitis and new ascites on recent CT. No elevation in LFTs to suggest stent occlusion at this point.   HIDA ordered to assess for ongoing bile leak. Surgery team will reassess in AM

## 2020-05-14 NOTE — Plan of Care (Signed)
  Problem: Education: Goal: Knowledge of General Education information will improve Description: Including pain rating scale, medication(s)/side effects and non-pharmacologic comfort measures Outcome: Progressing   Problem: Health Behavior/Discharge Planning: Goal: Ability to manage health-related needs will improve Outcome: Progressing   Problem: Clinical Measurements: Goal: Ability to maintain clinical measurements within normal limits will improve Outcome: Progressing Goal: Will remain free from infection Outcome: Progressing Goal: Diagnostic test results will improve Outcome: Progressing Goal: Respiratory complications will improve Outcome: Progressing Goal: Cardiovascular complication will be avoided Outcome: Progressing   Problem: Activity: Goal: Risk for activity intolerance will decrease Outcome: Progressing   Problem: Nutrition: Goal: Adequate nutrition will be maintained Outcome: Progressing   Problem: Coping: Goal: Level of anxiety will decrease Outcome: Progressing   Problem: Elimination: Goal: Will not experience complications related to bowel motility Outcome: Progressing Goal: Will not experience complications related to urinary retention Outcome: Progressing   Problem: Pain Managment: Goal: General experience of comfort will improve Outcome: Progressing   Problem: Safety: Goal: Ability to remain free from injury will improve Outcome: Progressing   Problem: Skin Integrity: Goal: Risk for impaired skin integrity will decrease Outcome: Progressing   Problem: Education: Goal: Knowledge of disease or condition will improve Outcome: Progressing Goal: Understanding of medication regimen will improve Outcome: Progressing Goal: Individualized Educational Video(s) Outcome: Progressing   Problem: Activity: Goal: Ability to tolerate increased activity will improve Outcome: Progressing   Problem: Cardiac: Goal: Ability to achieve and maintain  adequate cardiopulmonary perfusion will improve Outcome: Progressing   Problem: Health Behavior/Discharge Planning: Goal: Ability to safely manage health-related needs after discharge will improve Outcome: Progressing   Problem: Activity: Goal: Ability to tolerate increased activity will improve Outcome: Progressing   Problem: Respiratory: Goal: Ability to maintain a clear airway and adequate ventilation will improve Outcome: Progressing   Problem: Role Relationship: Goal: Method of communication will improve Outcome: Progressing

## 2020-05-14 NOTE — Progress Notes (Addendum)
Progress Note   Subjective  Complains of abdominal bloating, fullness. Frustrated with persistent bile drainage.    Objective  Vital signs in last 24 hours: Temp:  [97.7 F (36.5 C)-98.2 F (36.8 C)] 98.2 F (36.8 C) (09/06 0756) Pulse Rate:  [77-93] 93 (09/06 0756) Resp:  [16-18] 17 (09/06 0756) BP: (123-132)/(76-91) 126/91 (09/06 0756) SpO2:  [96 %-98 %] 97 % (09/06 0756) Last BM Date: 05/11/20  General: Alert, well-developed, in NAD Heart:  Regular rate and rhythm; no murmurs Chest: Clear to ascultation bilaterally Abdomen:  Soft, nontender, tight and distended. Normal bowel sounds, without guarding, and without rebound.   Extremities:  Without edema. Neurologic:  Alert and  oriented x4; grossly normal neurologically. Psych:  Alert and cooperative. Normal mood and affect.  Intake/Output from previous day: 09/05 0701 - 09/06 0700 In: 720 [P.O.:720] Out: 1450 [Urine:1100; Drains:350] Intake/Output this shift: No intake/output data recorded.  Lab Results: Recent Labs    05/11/20 1712  WBC 15.5*  HGB 11.7*  HCT 36.9*  PLT 438*   BMET Recent Labs    05/12/20 0334  NA 134*  K 4.5  CL 102  CO2 23  GLUCOSE 134*  BUN 12  CREATININE 0.64  CALCIUM 8.4*   LFT Recent Labs    05/12/20 0334  PROT 4.6*  ALBUMIN 1.7*  AST 22  ALT 18  ALKPHOS 72  BILITOT 0.9    Studies/Results: CT ABDOMEN PELVIS W CONTRAST  Result Date: 05/13/2020 CLINICAL DATA:  History of cholecystectomy complicated by development of a postoperative bile leak from the cystic duct remnant, post image guided placement of a percutaneous drainage catheter into the gallbladder fossa fluid collection on 02/13/2020 (Dr. Annamaria Boots). Patient subsequently underwent placement of an internal biliary stent at 82/50/0370 which was complicated by development of postprocedural pancreatitis. Evaluate for bile leak/biloma. EXAM: CT ABDOMEN AND PELVIS WITH CONTRAST TECHNIQUE: Multidetector CT imaging of the  abdomen and pelvis was performed using the standard protocol following bolus administration of intravenous contrast. CONTRAST:  193mL OMNIPAQUE IOHEXOL 300 MG/ML  SOLN COMPARISON:  CT abdomen pelvis-05/03/2020; 04/28/2020; 04/26/2020; 03/01/2020; 02/13/2020; image guided placement of gallbladder fossa drainage catheter-02/13/2020; drainage catheter injection-03/01/2020; ERCP-03/21/2020 FINDINGS: Lower chest: Limited visualization of lower thorax demonstrates small to moderate-sized left and trace right-sided pleural effusions with associated bibasilar consolidative opacities and associated air bronchograms and volume loss within the bilateral lower lobes, left greater than right, similar to the 8/26 examination. Normal heart size.  No pericardial effusion. Hepatobiliary: Normal hepatic contour. No discrete hepatic lesions. Stable positioning of percutaneous drainage catheter with end coiled and locked within the gallbladder fossa. Stable positioning of internal biliary stent without development of intrahepatic biliary ductal dilatation. Interval development of small amount of perihepatic ascites which extends to the level of the lower abdomen/pelvis. Pancreas: Redemonstrated sequela of pancreatitis with extensive inflammatory change about the pancreatic bed. There is persistent enhancement of the pancreatic parenchyma without definitive evidence of pancreatic necrosis. No definitive pancreatic ductal dilatation on this non pancreatic protocol CT scan. Inflammatory change again extends along the anterior aspect of the left pararenal space without peripherally enhancing definable/drainable pancreatic pseudocyst. Spleen: Normal appearance of the spleen. Adrenals/Urinary Tract: There is symmetric enhancement and excretion of the bilateral kidneys. Redemonstrated punctate bilateral nonobstructing renal stones with dominant stones within the superior poles of the bilateral kidneys measuring 0.4 cm in diameter  (left-coronal image 86, series 6; right-coronal image 88, series 6). No evidence of urinary obstruction or perinephric stranding. Normal appearance the bilateral  adrenal glands. Normal appearance of the urinary bladder given degree of distention. Stomach/Bowel: Moderate colonic stool burden without evidence of enteric obstruction. Scattered colonic diverticulosis without evidence of superimposed acute diverticulitis. Normal appearance of the terminal ileum and the retrocecal appendix. Mild bowel wall thickening involving the transverse colon is likely reactive secondary to adjacent inflammatory change within the pancreas. No pneumoperitoneum, pneumatosis or portal venous gas. Vascular/Lymphatic: Scattered atherosclerotic plaque. The major branch vessels of the abdominal aorta appear patent within normal caliber abdominal aorta on this non CTA examination. The peripheral aspect of the portal vein as well as the portal splenule confluence and superior aspect of the SMV and splenic vein are narrowed though remains patent. Scattered porta hepatis, gastrohepatic ligament and mesenteric lymph nodes are prominent though individually not enlarged by size criteria, presumably reactive in etiology. Reproductive: Normal appearance of the prostate gland. Small moderate amount of fluid within the lower pelvis. Other: Diffuse body wall anasarca, most conspicuous about the midline of the low back. Musculoskeletal: No acute or aggressive osseous abnormalities. Stigmata of dish within the thoracic spine. Mild-to-moderate degenerative change the bilateral hips with joint space loss, subchondral sclerosis and osteophytosis. IMPRESSION: 1. Sequela of severe pancreatitis with extensive fluid about the pancreatic bed but without peripherally enhancing definable/drainable pancreatic pseudocyst and without definitive evidence of pancreatic necrosis. 2. Stable positioning of internal biliary stent with apparent preserved functionality as  there is no intrahepatic biliary ductal dilatation. 3. Interval development of small to moderate volume intra-abdominal ascites, the etiology of which is not determined on this examination though given extensive pancreatitis and apparent functionality of the internal biliary stent, fluid may represent pancreatic enzymes. Fluid sampling with sending aspirated fluid for amylase, lipase and bilirubin levels could be performed as indicated. Alternatively, if there is concern for a bile leak, further evaluation with nuclear medicine HIDA scan could be performed as indicated. 4. Stable positioning of gallbladder fossa percutaneous drainage catheter. 5. Grossly unchanged small to moderate sized left and trace right-sided pleural effusions with associated bibasilar opacities/atelectasis. 6. Bilateral nonobstructing nephrolithiasis. Electronically Signed   By: Sandi Mariscal M.D.   On: 05/13/2020 15:41      Assessment & Recommendations   1. Pancreatitis, post ERCP, resolving. CT shows fluid around the pancreas, ascites, no areas of necrosis, no pseudocysts and transverse colon wall thickening. Suspect the ascites is pancreas related as bile would be quite painful.   2. 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 350 cc drain output yesterday. CT show perc drain in good position/stable position, biliary stent in good position/stable position, no biloma, no biliary dilation. Continue meropenem, now day 11. Dr. Rush Landmark will evaluate tomorrow for additional plans.   3. Constipation. CT with moderate colonic stool burden.  Miralax bid x 2 days then qd.      LOS: 17 days   Rahima Fleishman T. Fuller Plan MD 05/14/2020, 10:08 AM

## 2020-05-14 NOTE — Progress Notes (Addendum)
Patient ID: Adam Fowler, male   DOB: Jan 17, 1956, 64 y.o.   MRN: [id number]  PROGRESS NOTE    Parry Po  OBS:[id number] DOB: 01-Sep-1956 DOA: 04/26/2020 PCP: Melony Overly, MD    Brief Narrative:  64 year old male with past medical history significant for hypertension.  This is his fourth admission since her initial admission for gallstone pancreatitis with removal of his gallbladder.  He continues to have persistent bile leak and has had numerous ERCPs with attempts at fixing this.  He lastunderwent ERCP on 8/18 and biliary stent exchange for persistent postoperative bile leak. Patient developed severe epigastric abdominal pain, associated with nausea, and vomiting after ERCP procedure.   CT of the abdomen pelvis show changes consistent with acute pancreatitis, no abscess or drainable fluid collection. Patient was admitted to the medical ward, he received supportive medical therapy including intravenous fluids, analgesics and IV antibiotics.   His hospitalization has been complicated by metabolic encephalopathy,acute hypoxicrespiratory failure, renal failure, new onset paroxysmal atrial fibrillation and acute ileus.  08/21-08/22 rapid response due to increase work of breathing and atrial fibrillation, required invasive mechanical ventilation until 8/24. He was treated for volume overload and aspiration pneumonia. Received diuresis and antibiotic therapy with Zosyn.   His antibiotic therapy was switched from Zosyn to meropenem on 8/27. Hewasplaced on tube feeds via NG tubefor several days until 9/30.   Assessment & Plan:   Principal Problem:   Post-ERCP acute pancreatitis Active Problems:   Severe sepsis (HCC)   Polycythemia   AKI (acute kidney injury) ([place])   Metabolic acidosis   Acute respiratory failure with hypoxia (HCC)   Bilateral pleural effusion   Ileus (HCC)  1. Acute post ERCP pancreatitis/ ileus.Now patient is tolerating full nutritional support by mouth,  his abdominal pain has improved, no nausea or vomiting and positive bowel movements. Abdominal film suggests that stent is in place. Still with fairly high output from the drain.  Discussed with general surgery today after discussion with GI about other interventions.  They have ordered a HIDA scan and will reassess in the morning.  [facility] indicates that their complex endoscopy specialist will be back tomorrow to reassess.  Tolerating well antibiotic therapy withmeropenem#11 /14.   Continue pain control   Patient has been ambulating, physical/ occupational therapy now recommending home health services at discharge.  2. Acute hypoxic respiratory failure.clinically resolved, continue oxymetry monitoring and incentive spirometer.  Out of bed and ambulate in the hallway.  3. AKIwith hypokalemia.Stable renal function with serum cr at 0.64   4. Paroxysmal atrial fibrillation/ transitory.Converted to simus rhythm. Has a low risk of thrombosis CHADS vasc score of 1. Will hold on antithrombotic therapy for now.  Patient has remained on sinus rhythm  6. Anemiaof chronic disease.hgb and hct have been stable, cell count for this am is pending  7. Acute metabolic encephalopathy/ depression and anxiety.Clinically has resolved. Continue withsertraline. Anxiety has improved.  8. HTN/ dyslipidemia.( [place] on lisinopril and hctz combination).Blood pressure has remained stable, will continue to hold on antihypertensive medications for now to prevent hypotension.  Continue with pravastatin.  11. Transitory hyperglycemia. Hgb A1c 5.6. CBGs are 122/127/23/111  12. Moderate calorie protein malnutrition.Onnutritional supplementation.   DVT prophylaxis: Lovenox SQ Code Status: Full code  Family Communication: Patient at bedside Disposition Plan: Unclear, hopefully for home,  however given his ongoing bile leaks and his continued presentations of sepsis suspect  we need to get this completely wrapped up before we send him home again.  Patient remains  inpatient due to ongoing IV antibiotics, IV fluid, continued work-up and treatment.  Consultants:   GI  INR  Pulmonary critical care medicine  General surgery  Procedures:  Echo  ERCP  [place] 8/27  Intubation 8/22-8/24  Antimicrobials: Anti-infectives (From admission, onward)   Start     Dose/Rate Route Frequency Ordered Stop   05/04/20 1230  meropenem (MERREM) 1 g in sodium chloride 0.9 % 100 mL IVPB        1 g 200 mL/hr over 30 Minutes Intravenous Every 8 hours 05/04/20 1156     05/01/20 1400  piperacillin-tazobactam (ZOSYN) IVPB 3.375 g        3.375 g 12.5 mL/hr over 240 Minutes Intravenous Every 8 hours 05/01/20 1013 05/03/20 0907   04/29/20 2000  vancomycin (VANCOCIN) IVPB 1000 mg/200 mL premix  Status:  Discontinued        1,000 mg 200 mL/hr over 60 Minutes Intravenous Every 12 hours 04/29/20 0653 04/30/20 0831   04/29/20 0745  vancomycin (VANCOREADY) IVPB 1500 mg/300 mL        1,500 mg 150 mL/hr over 120 Minutes Intravenous  Once 04/29/20 0653 04/29/20 1129   04/26/20 2300  piperacillin-tazobactam (ZOSYN) IVPB 3.375 g        3.375 g 12.5 mL/hr over 240 Minutes Intravenous Every 8 hours 04/26/20 1716 05/01/20 0559   04/26/20 1700  piperacillin-tazobactam (ZOSYN) IVPB 3.375 g        3.375 g 100 mL/hr over 30 Minutes Intravenous  Once 04/26/20 1655 04/26/20 1844       Subjective: Has nausea and emesis again today.  He has gotten his PPI, Zofran, Tums, and MiraLAX none which have really helped.  Objective: Vitals:   05/13/20 0517 05/13/20 1530 05/13/20 1950 05/14/20 0756  BP: 130/85 132/76 123/87 (!) 126/91  Pulse: 78 77 88 93  Resp:  18 16 17   Temp: 97.9 F (36.6 C) 98.2 F (36.8 C) 97.7 F (36.5 C) 98.2 F (36.8 C)  TempSrc: Oral Oral Oral Oral  SpO2: 96% 98% 96% 97%  Weight:      Height:        Intake/Output Summary (Last 24 hours) at 05/14/2020  1218 Last data filed at 05/14/2020 1000 Gross per 24 hour  Intake 600 ml  Output 1250 ml  Net -650 ml   Filed Weights   05/07/20 0500 05/08/20 0357 05/13/20 0306  Weight: 86.6 kg 86.9 kg 86 kg    Examination:  General exam: Appears calm and comfortable  Respiratory system: Clear to auscultation. Respiratory effort normal. Cardiovascular system: S1 & S2 heard, RRR.  Gastrointestinal system: Abdomen is nondistended, soft and nontender.  Central nervous system: Alert and oriented. No focal neurological deficits. Extremities: Symmetric  Skin: No rashes Psychiatry: Judgement and insight appear normal. Mood & affect appropriate.     Data Reviewed: I have personally reviewed following labs and imaging studies  CBC: Recent Labs  Lab 05/08/20 0245 05/09/20 0303 05/10/20 0426 05/11/20 0332 05/11/20 1712  WBC 19.5* 19.1* 18.3* 15.1* 15.5*  NEUTROABS 16.5* 15.8* 14.4* 12.2* 12.3*  HGB 10.5* 11.7* 11.6* 11.3* 11.7*  HCT 33.3* 37.3* 36.9* 35.4* 36.9*  MCV 89.3 91.0 91.1 88.1 88.7  PLT 366 479* 483* 449* 607*   Basic Metabolic Panel: Recent Labs  Lab 05/08/20 0245 05/09/20 0303 05/10/20 0426 05/11/20 0332 05/12/20 0334  NA 134* 135 132* 133* 134*  K 3.9 4.6 4.6 4.6 4.5  CL 103 103 102 103 102  CO2 22 24 23 23  23  GLUCOSE 188* 183* 186* 145* 134*  BUN 15 11 13 15 12   CREATININE 0.75 0.71 0.73 0.66 0.64  CALCIUM 7.7* 8.0* 8.1* 8.1* 8.4*  MG 2.0  --   --   --   --    GFR: Estimated Creatinine Clearance: 101.3 mL/min (by C-G formula based on SCr of 0.64 mg/dL). Liver Function Tests: Recent Labs  Lab 05/12/20 0334  AST 22  ALT 18  ALKPHOS 72  BILITOT 0.9  PROT 4.6*  ALBUMIN 1.7*   CBG: Recent Labs  Lab 05/13/20 1208 05/13/20 1617 05/13/20 1950 05/14/20 0634 05/14/20 1204  GLUCAP 111* 103* 127* 122* 119*      Radiology Studies: CT ABDOMEN PELVIS W CONTRAST  Result Date: 05/13/2020 CLINICAL DATA:  History of cholecystectomy complicated by development of  a postoperative bile leak from the cystic duct remnant, post image guided placement of a percutaneous drainage catheter into the gallbladder fossa fluid collection on 02/13/2020 (Dr. Annamaria Boots). Patient subsequently underwent placement of an internal biliary stent at 03/03/9484 which was complicated by development of postprocedural pancreatitis. Evaluate for bile leak/biloma. EXAM: CT ABDOMEN AND PELVIS WITH CONTRAST TECHNIQUE: Multidetector CT imaging of the abdomen and pelvis was performed using the standard protocol following bolus administration of intravenous contrast. CONTRAST:  142mL OMNIPAQUE IOHEXOL 300 MG/ML  SOLN COMPARISON:  CT abdomen pelvis-05/03/2020; 04/28/2020; 04/26/2020; 03/01/2020; 02/13/2020; image guided placement of gallbladder fossa drainage catheter-02/13/2020; drainage catheter injection-03/01/2020; ERCP-03/21/2020 FINDINGS: Lower chest: Limited visualization of lower thorax demonstrates small to moderate-sized left and trace right-sided pleural effusions with associated bibasilar consolidative opacities and associated air bronchograms and volume loss within the bilateral lower lobes, left greater than right, similar to the 8/26 examination. Normal heart size.  No pericardial effusion. Hepatobiliary: Normal hepatic contour. No discrete hepatic lesions. Stable positioning of percutaneous drainage catheter with end coiled and locked within the gallbladder fossa. Stable positioning of internal biliary stent without development of intrahepatic biliary ductal dilatation. Interval development of small amount of perihepatic ascites which extends to the level of the lower abdomen/pelvis. Pancreas: Redemonstrated sequela of pancreatitis with extensive inflammatory change about the pancreatic bed. There is persistent enhancement of the pancreatic parenchyma without definitive evidence of pancreatic necrosis. No definitive pancreatic ductal dilatation on this non pancreatic protocol CT scan. Inflammatory  change again extends along the anterior aspect of the left pararenal space without peripherally enhancing definable/drainable pancreatic pseudocyst. Spleen: Normal appearance of the spleen. Adrenals/Urinary Tract: There is symmetric enhancement and excretion of the bilateral kidneys. Redemonstrated punctate bilateral nonobstructing renal stones with dominant stones within the superior poles of the bilateral kidneys measuring 0.4 cm in diameter (left-coronal image 86, series 6; right-coronal image 88, series 6). No evidence of urinary obstruction or perinephric stranding. Normal appearance the bilateral adrenal glands. Normal appearance of the urinary bladder given degree of distention. Stomach/Bowel: Moderate colonic stool burden without evidence of enteric obstruction. Scattered colonic diverticulosis without evidence of superimposed acute diverticulitis. Normal appearance of the terminal ileum and the retrocecal appendix. Mild bowel wall thickening involving the transverse colon is likely reactive secondary to adjacent inflammatory change within the pancreas. No pneumoperitoneum, pneumatosis or portal venous gas. Vascular/Lymphatic: Scattered atherosclerotic plaque. The major branch vessels of the abdominal aorta appear patent within normal caliber abdominal aorta on this non CTA examination. The peripheral aspect of the portal vein as well as the portal splenule confluence and superior aspect of the SMV and splenic vein are narrowed though remains patent. Scattered porta hepatis, gastrohepatic ligament and mesenteric lymph nodes are  prominent though individually not enlarged by size criteria, presumably reactive in etiology. Reproductive: Normal appearance of the prostate gland. Small moderate amount of fluid within the lower pelvis. Other: Diffuse body wall anasarca, most conspicuous about the midline of the low back. Musculoskeletal: No acute or aggressive osseous abnormalities. Stigmata of dish within the  thoracic spine. Mild-to-moderate degenerative change the bilateral hips with joint space loss, subchondral sclerosis and osteophytosis. IMPRESSION: 1. Sequela of severe pancreatitis with extensive fluid about the pancreatic bed but without peripherally enhancing definable/drainable pancreatic pseudocyst and without definitive evidence of pancreatic necrosis. 2. Stable positioning of internal biliary stent with apparent preserved functionality as there is no intrahepatic biliary ductal dilatation. 3. Interval development of small to moderate volume intra-abdominal ascites, the etiology of which is not determined on this examination though given extensive pancreatitis and apparent functionality of the internal biliary stent, fluid may represent pancreatic enzymes. Fluid sampling with sending aspirated fluid for amylase, lipase and bilirubin levels could be performed as indicated. Alternatively, if there is concern for a bile leak, further evaluation with nuclear medicine HIDA scan could be performed as indicated. 4. Stable positioning of gallbladder fossa percutaneous drainage catheter. 5. Grossly unchanged small to moderate sized left and trace right-sided pleural effusions with associated bibasilar opacities/atelectasis. 6. Bilateral nonobstructing nephrolithiasis. Electronically Signed   By: Sandi Mariscal M.D.   On: 05/13/2020 15:41     Scheduled Meds: . acetaminophen  650 mg Oral Q6H  . acidophilus  2 capsule Oral Daily  . calcium carbonate  1 tablet Oral TID WC  . Chlorhexidine Gluconate Cloth  6 each Topical Daily  . enoxaparin (LOVENOX) injection  40 mg Subcutaneous Q24H  . feeding supplement (ENSURE ENLIVE)  237 mL Oral BID BM  . ibuprofen  400 mg Oral TID  . mouth rinse  15 mL Mouth Rinse BID  . multivitamin with minerals  1 tablet Oral Daily  . pantoprazole  40 mg Oral Daily  . polyethylene glycol  17 g Oral BID  . [START ON 05/16/2020] polyethylene glycol  17 g Oral Daily  . pravastatin  20 mg  Oral q1800  . sertraline  100 mg Oral Daily  . sodium chloride flush  10-40 mL Intracatheter Q12H  . zolpidem  5 mg Oral QHS   Continuous Infusions: . sodium chloride Stopped (05/07/20 0923)  . meropenem (MERREM) IV 1 g (05/14/20 0907)     LOS: 17 days    Donnamae Jude, MD 05/14/2020 12:18 PM [phone] [facility] If 7PM-7AM, please contact night-coverage 05/14/2020, 12:18 PM

## 2020-05-15 ENCOUNTER — Inpatient Hospital Stay (HOSPITAL_COMMUNITY): Payer: 59

## 2020-05-15 DIAGNOSIS — K209 Esophagitis, unspecified without bleeding: Secondary | ICD-10-CM

## 2020-05-15 DIAGNOSIS — K315 Obstruction of duodenum: Secondary | ICD-10-CM

## 2020-05-15 LAB — COMPREHENSIVE METABOLIC PANEL
ALT: 20 U/L (ref 0–44)
AST: 26 U/L (ref 15–41)
Albumin: 2 g/dL — ABNORMAL LOW (ref 3.5–5.0)
Alkaline Phosphatase: 118 U/L (ref 38–126)
Anion gap: 12 (ref 5–15)
BUN: 16 mg/dL (ref 8–23)
CO2: 24 mmol/L (ref 22–32)
Calcium: 8.7 mg/dL — ABNORMAL LOW (ref 8.9–10.3)
Chloride: 98 mmol/L (ref 98–111)
Creatinine, Ser: 0.99 mg/dL (ref 0.61–1.24)
GFR calc Af Amer: >60 mL/min (ref >60)
GFR calc non Af Amer: >60 mL/min (ref >60)
Glucose, Bld: 128 mg/dL — ABNORMAL HIGH (ref 70–99)
Potassium: 4.3 mmol/L (ref 3.5–5.1)
Sodium: 134 mmol/L — ABNORMAL LOW (ref 135–145)
Total Bilirubin: 1 mg/dL (ref 0.3–1.2)
Total Protein: 5.6 g/dL — ABNORMAL LOW (ref 6.5–8.1)

## 2020-05-15 LAB — CBC
HCT: 37.7 % — ABNORMAL LOW (ref 39.0–52.0)
Hemoglobin: 12.1 g/dL — ABNORMAL LOW (ref 13.0–17.0)
MCH: 28.4 pg (ref 26.0–34.0)
MCHC: 32.1 g/dL (ref 30.0–36.0)
MCV: 88.5 fL (ref 80.0–100.0)
Platelets: 441 10*3/uL — ABNORMAL HIGH (ref 150–400)
RBC: 4.26 MIL/uL (ref 4.22–5.81)
RDW: 14.1 % (ref 11.5–15.5)
WBC: 16.6 10*3/uL — ABNORMAL HIGH (ref 4.0–10.5)
nRBC: 0 % (ref 0.0–0.2)

## 2020-05-15 LAB — GLUCOSE, CAPILLARY: Glucose-Capillary: 120 mg/dL — ABNORMAL HIGH (ref 70–99)

## 2020-05-15 MED ORDER — TRAZODONE HCL 50 MG PO TABS
50.0000 mg | ORAL_TABLET | Freq: Every day | ORAL | Status: DC
  Administered 2020-05-15 – 2020-05-30 (×15): 50 mg via ORAL
  Filled 2020-05-15 (×16): qty 1

## 2020-05-15 MED ORDER — TECHNETIUM TC 99M MEBROFENIN IV KIT
5.4000 | PACK | Freq: Once | INTRAVENOUS | Status: AC | PRN
  Administered 2020-05-15: 5.4 via INTRAVENOUS

## 2020-05-15 MED ORDER — OXYCODONE HCL 5 MG PO TABS
5.0000 mg | ORAL_TABLET | ORAL | Status: DC | PRN
  Administered 2020-05-15: 5 mg via ORAL
  Filled 2020-05-15 (×2): qty 1

## 2020-05-15 MED ORDER — OXYCODONE HCL ER 10 MG PO T12A
10.0000 mg | EXTENDED_RELEASE_TABLET | Freq: Two times a day (BID) | ORAL | Status: DC
  Administered 2020-05-15 – 2020-05-25 (×21): 10 mg via ORAL
  Filled 2020-05-15 (×22): qty 1

## 2020-05-15 MED ORDER — BISACODYL 10 MG RE SUPP
10.0000 mg | Freq: Once | RECTAL | Status: AC
  Administered 2020-05-15: 10 mg via RECTAL
  Filled 2020-05-15: qty 1

## 2020-05-15 NOTE — Plan of Care (Signed)

## 2020-05-15 NOTE — Progress Notes (Signed)
Gastroenterology Inpatient Follow-up Note   PATIENT IDENTIFICATION  Adam Fowler is a 64 y.o. male with a pmh significant for nephrolithiasis, hypertension, insomnia, complicated symptomatic gallbladder disease (status post ERCP for choledocholithiasis and subsequent cholecystectomy (with persistent bile leak), post ERCP pancreatitis. Hospital Day: 20  SUBJECTIVE  The patient has returned from his HIDA scan. He does have some nausea and had some emesis this afternoon but it was nonbloody. He had been doing okay but has had some more vomiting episodes recently. CT scan from 2 days ago was reviewed. His I/O from JP drain/gravity bag has increased substantially. HIDA scan with results as below. Long conversation with the patient and patient's wife (greater than 40 minutes on the phone) was had today. Continues to have abdominal pain. He is not sleeping and wonders if there is something that can be done for that. He is wondering if his pain medications can be adjusted. He is having significant anxiety and is wondering if there may be other medications that could be used for anxiolysis. He is afraid that he is going to die but wants to get better. No fevers or chills   OBJECTIVE  Scheduled Inpatient Medications:  . acetaminophen  650 mg Oral Q6H  . acidophilus  2 capsule Oral Daily  . calcium carbonate  1 tablet Oral TID WC  . Chlorhexidine Gluconate Cloth  6 each Topical Daily  . enoxaparin (LOVENOX) injection  40 mg Subcutaneous Q24H  . feeding supplement (ENSURE ENLIVE)  237 mL Oral BID BM  . ibuprofen  400 mg Oral TID  . mouth rinse  15 mL Mouth Rinse BID  . multivitamin with minerals  1 tablet Oral Daily  . oxyCODONE  10 mg Oral Q12H  . pantoprazole  40 mg Oral Daily  . polyethylene glycol  17 g Oral BID  . [START ON 05/16/2020] polyethylene glycol  17 g Oral Daily  . pravastatin  20 mg Oral q1800  . sertraline  100 mg Oral Daily  . sodium chloride flush  10-40 mL  Intracatheter Q12H  . zolpidem  5 mg Oral QHS   Continuous Inpatient Infusions:  . sodium chloride Stopped (05/07/20 0923)  . meropenem (MERREM) IV 1 g (05/15/20 0912)   PRN Inpatient Medications: HYDROmorphone (DILAUDID) injection, ipratropium-albuterol, lidocaine, metoprolol tartrate, ondansetron, oxyCODONE, polyvinyl alcohol, promethazine, sodium chloride flush   Physical Examination  Temp:  [97.5 F (36.4 C)-98.5 F (36.9 C)] 97.5 F (36.4 C) (09/07 1425) Pulse Rate:  [82-100] 82 (09/07 1425) Resp:  [16-18] 16 (09/07 1425) BP: (127-132)/(81-93) 132/91 (09/07 1425) SpO2:  [95 %-100 %] 97 % (09/07 1425) Temp (24hrs), Avg:98.1 F (36.7 C), Min:97.5 F (36.4 C), Max:98.5 F (36.9 C)  Weight: 86 kg GEN: NAD, appears stated age, doesn't appear chronically ill PSYCH: Cooperative, without pressured speech EYE: Conjunctivae pink, sclerae anicteric ENT: MMM, without oral ulcers, no erythema or exudates noted NECK: Supple CV: RR without R/Gs  RESP: CTAB posteriorly, without wheezing GI: NABS, soft, NT/ND, without rebound or guarding, no HSM appreciated GU: DRE shows MSK/EXT: _ edema, no palmar erythema SKIN: No jaundice, no spider angiomata, no concerning rashes NEURO:  Alert & Oriented x 3, no focal deficits, no evidence of asterixis   Review of Data   Laboratory Studies   Recent Labs  Lab 05/15/20 0614  NA 134*  K 4.3  CL 98  CO2 24  BUN 16  CREATININE 0.99  GLUCOSE 128*  CALCIUM 8.7*   Recent Labs  Lab 05/15/20 [phone]  AST 26  ALT 20  ALKPHOS 118    Recent Labs  Lab 05/11/20 0332 05/11/20 0332 05/11/20 1712 05/11/20 1712 05/15/20 0614  WBC 15.1*   < > 15.5*   < > 16.6*  HGB 11.3*   < > 11.7*   < > 12.1*  HCT 35.4*   < > 36.9*   < > 37.7*  PLT 449*  --  438*  --  441*   < > = values in this interval not displayed.   No results for input(s): APTT, INR in the last 168 hours.   Imaging Studies  CT ABDOMEN PELVIS W CONTRAST  Result Date:  05/13/2020 CLINICAL DATA:  History of cholecystectomy complicated by development of a postoperative bile leak from the cystic duct remnant, post image guided placement of a percutaneous drainage catheter into the gallbladder fossa fluid collection on 02/13/2020 (Dr. Annamaria Boots). Patient subsequently underwent placement of an internal biliary stent at 32/20/2542 which was complicated by development of postprocedural pancreatitis. Evaluate for bile leak/biloma. EXAM: CT ABDOMEN AND PELVIS WITH CONTRAST TECHNIQUE: Multidetector CT imaging of the abdomen and pelvis was performed using the standard protocol following bolus administration of intravenous contrast. CONTRAST:  171mL OMNIPAQUE IOHEXOL 300 MG/ML  SOLN COMPARISON:  CT abdomen pelvis-05/03/2020; 04/28/2020; 04/26/2020; 03/01/2020; 02/13/2020; image guided placement of gallbladder fossa drainage catheter-02/13/2020; drainage catheter injection-03/01/2020; ERCP-03/21/2020 FINDINGS: Lower chest: Limited visualization of lower thorax demonstrates small to moderate-sized left and trace right-sided pleural effusions with associated bibasilar consolidative opacities and associated air bronchograms and volume loss within the bilateral lower lobes, left greater than right, similar to the 8/26 examination. Normal heart size.  No pericardial effusion. Hepatobiliary: Normal hepatic contour. No discrete hepatic lesions. Stable positioning of percutaneous drainage catheter with end coiled and locked within the gallbladder fossa. Stable positioning of internal biliary stent without development of intrahepatic biliary ductal dilatation. Interval development of small amount of perihepatic ascites which extends to the level of the lower abdomen/pelvis. Pancreas: Redemonstrated sequela of pancreatitis with extensive inflammatory change about the pancreatic bed. There is persistent enhancement of the pancreatic parenchyma without definitive evidence of pancreatic necrosis. No definitive  pancreatic ductal dilatation on this non pancreatic protocol CT scan. Inflammatory change again extends along the anterior aspect of the left pararenal space without peripherally enhancing definable/drainable pancreatic pseudocyst. Spleen: Normal appearance of the spleen. Adrenals/Urinary Tract: There is symmetric enhancement and excretion of the bilateral kidneys. Redemonstrated punctate bilateral nonobstructing renal stones with dominant stones within the superior poles of the bilateral kidneys measuring 0.4 cm in diameter (left-coronal image 86, series 6; right-coronal image 88, series 6). No evidence of urinary obstruction or perinephric stranding. Normal appearance the bilateral adrenal glands. Normal appearance of the urinary bladder given degree of distention. Stomach/Bowel: Moderate colonic stool burden without evidence of enteric obstruction. Scattered colonic diverticulosis without evidence of superimposed acute diverticulitis. Normal appearance of the terminal ileum and the retrocecal appendix. Mild bowel wall thickening involving the transverse colon is likely reactive secondary to adjacent inflammatory change within the pancreas. No pneumoperitoneum, pneumatosis or portal venous gas. Vascular/Lymphatic: Scattered atherosclerotic plaque. The major branch vessels of the abdominal aorta appear patent within normal caliber abdominal aorta on this non CTA examination. The peripheral aspect of the portal vein as well as the portal splenule confluence and superior aspect of the SMV and splenic vein are narrowed though remains patent. Scattered porta hepatis, gastrohepatic ligament and mesenteric lymph nodes are prominent though individually not enlarged by size criteria, presumably reactive in etiology. Reproductive:  Normal appearance of the prostate gland. Small moderate amount of fluid within the lower pelvis. Other: Diffuse body wall anasarca, most conspicuous about the midline of the low back.  Musculoskeletal: No acute or aggressive osseous abnormalities. Stigmata of dish within the thoracic spine. Mild-to-moderate degenerative change the bilateral hips with joint space loss, subchondral sclerosis and osteophytosis. IMPRESSION: 1. Sequela of severe pancreatitis with extensive fluid about the pancreatic bed but without peripherally enhancing definable/drainable pancreatic pseudocyst and without definitive evidence of pancreatic necrosis. 2. Stable positioning of internal biliary stent with apparent preserved functionality as there is no intrahepatic biliary ductal dilatation. 3. Interval development of small to moderate volume intra-abdominal ascites, the etiology of which is not determined on this examination though given extensive pancreatitis and apparent functionality of the internal biliary stent, fluid may represent pancreatic enzymes. Fluid sampling with sending aspirated fluid for amylase, lipase and bilirubin levels could be performed as indicated. Alternatively, if there is concern for a bile leak, further evaluation with nuclear medicine HIDA scan could be performed as indicated. 4. Stable positioning of gallbladder fossa percutaneous drainage catheter. 5. Grossly unchanged small to moderate sized left and trace right-sided pleural effusions with associated bibasilar opacities/atelectasis. 6. Bilateral nonobstructing nephrolithiasis. Electronically Signed   By: Sandi Mariscal M.D.   On: 05/13/2020 15:41   NM HEPATO BILIARY LEAK  Result Date: 05/15/2020 CLINICAL DATA:  Recurrent right upper quadrant abdominal pain. Status post cholecystectomy. Evaluate for bile leak. EXAM: NUCLEAR MEDICINE HEPATOBILIARY IMAGING TECHNIQUE: Sequential images of the abdomen were obtained out to 60 minutes following intravenous administration of radiopharmaceutical. RADIOPHARMACEUTICALS:  5.4 mCi Tc-55m  Choletec IV COMPARISON:  Abdomen/pelvis CT 05/13/2020. Nuclear scintigraphy 02/03/2020. FINDINGS: Prompt uptake  and biliary excretion of activity by the liver is seen. Blood pool activity clears rapidly. Biliary activity evident by 25 minutes. No definite common bile duct activity by 60 minutes. No gut activity on today's study. Radiotracer accumulation at the inferior tip of the liver against a 25 minutes and progresses through the 60 minutes of the exam. This accumulates along the lateral right abdominal wall, inferior to the liver, likely in the paracolic gutter. Patient's biliary drain was unclamped and anterior imaging was obtained 5 minutes later, demonstrating radiotracer within the tubing of the drain. IMPRESSION: Imaging features consistent with persistent bile leak accumulating in the lateral aspect of the right upper quadrant, likely in the paracolic gutter. After unclamping the biliary drain, radiotracer is visible within the drain tubing. Electronically Signed   By: Misty Stanley M.D.   On: 05/15/2020 12:29    ASSESSMENT  Adam Fowler is a 65 y.o. male  with a pmh significant for nephrolithiasis, hypertension, insomnia, complicated symptomatic gallbladder disease (status post ERCP for choledocholithiasis and subsequent cholecystectomy (with persistent bile leak), post ERCP pancreatitis.  The patient is hemodynamically stable. Clinically he has had significant progression over the course of the last week and a half in regards to improvement from a pancreatitis perspective. Unfortunately, the patient's CT scan suggested persistence of pancreatitis but no overt necrosis. His HIDA scan today has resulted showing evidence of a persistent bile leak. Unfortunately, he is still leaking and having significant mount of output. This is quite weird in the sense that the patient has had a stable stent position and what looks to be appropriate placement without movement on multiple imaging studies. Why he is having such significant increase in his output is concerning. It is not clear to me that a duct of Luschka is not  impossible to  be causing him issues that are persisting (although I did not see one on my last 2 ERCPs). With this being said I do think that our next steps are either a repeat endoscopic attempt versus surgery. Certainly there is no acute surgical intervention that may be needed but consideration of the role of biliary bypass or potential Whipple would need to be entertained if the patient's bile leak persists. I offered the patient consideration of being referred to [facility] or [facility] or [facility] to see advanced endoscopy in an effort of trying to see if they would have anything else they can offer endoscopically. He and his wife have deferred on this. My thoughts are to consider keeping the stent in place doing a cholangiogram seeing if we see a leak if I see no leak that is overtly apparent from my standpoint of the cystic duct then I may just leave a long plastic stent through the metal stent in an effort of trying to keep things moving and try to minimize bile coming through that area. If a cholangiogram is performed and the stent looks like it has become malpositioned, then a longer 8 mm x 8 cm stent which we have ordered and we now have an hand could be considered. I will tentatively place the patient on for procedure on Thursday. Certainly he and his wife understand after our long conversation that nothing in this world is absolute. The risks of ERCP were rediscussed. Having superimposed pancreatitis on top of what he is already dealing with would not be ideal but certainly it is not ideal to have a persistent bile leak that is as high-grade as he is having currently.  The risks and benefits of endoscopic evaluation were discussed with the patient; these include but are not limited to the risk of perforation, infection, bleeding, missed lesions, lack of diagnosis, severe illness requiring hospitalization, as well as anesthesia and sedation related illnesses.  The risks of an ERCP were discussed at length,  including but not limited to the risk of perforation, bleeding, abdominal pain, post-ERCP pancreatitis (while usually mild can be severe and even life threatening).  The patient is agreeable to proceed. I will ask our medical team to consider other medications for insomnia as well as anxiolysis as well as for pain control. We will asked to see if our care manager can talk with the patient and wife in regards to understanding his insurance coverage is and what is and what is not being covered. Our team will see him again tomorrow and should he decide or his wife decide that they want to be evaluated and have another set of eyes outside of this area of [place] we certainly can help arrange that if necessary.  All patient questions were answered to the best of my ability, and the patient agrees to the aforementioned plan of action with follow-up as indicated.   PLAN/RECOMMENDATIONS  Tentative ERCP scheduled for this Thursday Appreciate medical service -Consider anxiolysis -Consider other medications for insomnia -Consider pain medication titration Appreciate surgical service -Agree no need for acute surgical intervention but potential biliary bypass may need to be considered if patient's bile leak cannot be controlled endoscopically Continue antibiotics as currently scheduled We will be available if further questions arise  Please page/call with questions or concerns.   Justice Britain, MD [facility] Office # [phone]    LOS: 94 days  Irving Copas  05/15/2020, 2:39 PM

## 2020-05-15 NOTE — Progress Notes (Signed)
PROGRESS NOTE    Adam Fowler  FKC:[id number] DOB: 03/16/1956 DOA: 04/26/2020 PCP: Melony Overly, MD    Brief Narrative:  Patient admitted to the hospital working diagnosis of post ERCP acute pancreatitis. Prolonged hospitalization with multiple complications, including respiratory failure (required mechanical ventilation), Adam, encephalopathy and atrial fibrillation.  64 year old male with past medical history significant for hypertension. Heunderwent ERCP on 8/18 and biliary stent exchange for persistent postoperative bile leak,related to recent cholecystectomy with abscess status post percutaneous drainage. Patient developed severe epigastric abdominal pain, associated with nausea, and vomiting after ERCP procedure. On his initial physical examination blood pressure 137/88, heart rate 114, respiratory rate 23, oxygen saturation 92%,his lungs were clear to auscultation bilaterally, heart S1-S2, present, tachycardic, his abdomen was tender to palpation, it was distended, no rebound or guarding,no lower extremity edema.  CT of the abdomen pelvis show changes consistent with acute pancreatitis, no abscess or drainable fluid collection. Patient was admitted to the medical ward, he received supportive medical therapy including intravenous fluids, analgesics and IV antibiotics.   His hospitalization has been complicated by metabolic encephalopathy,acute hypoxicrespiratory failure, renal failure, new onset paroxysmal atrial fibrillation and acute ileus.  08/21-08/22 rapid response due to increase work of breathing and atrial fibrillation, required invasive mechanical ventilation. He was treated for volume overload and aspiration pneumonia. Received diuresis and antibiotic therapy with Zosyn.   Liberated from mechanical ventilation on 08/24 and transitioned to nasal cannula with good toleration.   Patient continue to have leukocytosis and abdominal pain, repeat CT of the abdomen  pelvis(8/25)markedly diffuse pancreatic inflammation with fat stranding and free fluid.  His antibiotic therapy was switched from Zosyn to meropenem on 8/27. Hewasplaced on tube feeds via NG tube for several days.   On 09/03 Ng tube was removed, patient tolerating nutritional support with by mouth intake.   Slowly patient improving, but continue to have high output biliary drain, despite stent in appropriate location and stable liver enzymes.   HIDA scan today per surgery recommendations.    Assessment & Plan:   Principal Problem:   Post-ERCP acute pancreatitis Active Problems:   Severe sepsis (HCC)   Polycythemia   Adam (acute kidney injury) ([place])   Metabolic acidosis   Acute respiratory failure with hypoxia (HCC)   Bilateral pleural effusion   Ileus (HCC)    1. Acute post ERCP pancreatitis/ ileus.  Biliary drain out put over last 24 H is 625 ml.  wbc is stable to 16,1. AST 26 and ALT 29, T Bil 1.0. CT abdomen with sings of sequela of severe pancreatitis. Stable positioning of internal biliary stent with no intrahepatic biliary duct dilatation.   Continue antibiotic therapy withmeropenem#12/14.   Pain control with acetaminophen/ ibuprofen scheduled, as needed oxycodone and hydromorphone.he complains of pain mainly in the evening, will start patient on long acting oxycodone this pm at 18:00 and evaluate response.   Continue with bowel regimen.   Follow with HIDA scan results.   2. Acute hypoxic respiratory failure/ bilateral pleural effusions more left than right, likely transudate.  Continue close oxymetry monitoring, limit IV fluids and continue nutritional support.   3. AKIwith hypokalemia.Renal function with serum cr at 0,99, bicarbonate at 24 and K at 4,3.  Patient is tolerating po well, will continue to follow a fluid restricted IV fluids strategy.   4. Paroxysmal atrial fibrillation/ transitory.Converted to simus rhythm. Has a low risk of thrombosis  CHADS vasc score of 1. Will hold on antithrombotic therapy for now.    Continue  sinus rhythm.   6. Anemiaof chronic disease. hgb this am 12,1 and hct at 37.7. Continue close monitoring of call count.   7. Acute metabolic encephalopathy/ depression and anxiety.On sertraline per his home regimen with good toleration.  8. HTN/ dyslipidemia.( [place] on lisinopril and hctz combination). Blood pressure this am 131/93 mmHg. Continue to hold on antihypertensive medications.   On  pravastatin.   11. Transitory hyperglycemia. Hgb A1c 5.6. fasting glucose is 128, continue with insulin coverage has been discontinued with good toleration.    12. Moderate calorie protein malnutrition. Continue with nutritional supplementation.    Patient continue to be at high risk for worsening biliary leak   Status is: Inpatient  Remains inpatient appropriate because:Inpatient level of care appropriate due to severity of illness   Dispo:  Patient From: Home  Planned Disposition: home with home health  Expected discharge date: 05/18/20  Medically stable for discharge: No    DVT prophylaxis: Enoxaparin   Code Status:   full  Family Communication:  I spoke with patient's wife at the bedside, we talked in detail about patient's condition, plan of care and prognosis and all questions were addressed.      Nutrition Status: Nutrition Problem: Inadequate oral intake Etiology: acute illness Signs/Symptoms: NPO status Interventions: Ensure Enlive (each supplement provides 350kcal and 20 grams of protein), Magic cup, MVI    Consultants:   GI   IR    Antimicrobials:    meropenem.     Subjective: Patient continue to have back and abdominal pain, mainly in the afternoon. He continue to move bowel with help of laxatives, had vomiting yesterday but not today, continue with large drainage from biliary drain.   Objective: Vitals:   05/14/20 1511 05/14/20 1937 05/15/20 0100 05/15/20  0806  BP: 127/83 127/81 127/85 (!) 131/93  Pulse: 91 100 95 93  Resp: 18 16 16 17   Temp: 98.3 F (36.8 C) 98.4 F (36.9 C) 98.5 F (36.9 C) 97.8 F (36.6 C)  TempSrc: Oral Oral Oral Oral  SpO2: 95% 100% 98% 97%  Weight:      Height:        Intake/Output Summary (Last 24 hours) at 05/15/2020 1241 Last data filed at 05/15/2020 0500 Gross per 24 hour  Intake --  Output 925 ml  Net -925 ml   Filed Weights   05/07/20 0500 05/08/20 0357 05/13/20 0306  Weight: 86.6 kg 86.9 kg 86 kg    Examination:   General: Not in pain or dyspnea, deconditioned  Neurology: Awake and alert, non focal  E ENT: mild pallor, no icterus, oral mucosa moist Cardiovascular: No JVD. S1-S2 present, rhythmic, no gallops, rubs, or murmurs. ++ pitting bilataral lower extremity edema. Pulmonary: posiitve breath sounds bilaterally, adequate air movement, no wheezing, rhonchi or rales. Gastrointestinal. Abdomen distended but not tender. No rebound or guarding. Skin. No rashes Musculoskeletal: no joint deformities     Data Reviewed: I have personally reviewed following labs and imaging studies  CBC: Recent Labs  Lab 05/09/20 0303 05/10/20 0426 05/11/20 0332 05/11/20 1712 05/15/20 0614  WBC 19.1* 18.3* 15.1* 15.5* 16.6*  NEUTROABS 15.8* 14.4* 12.2* 12.3*  --   HGB 11.7* 11.6* 11.3* 11.7* 12.1*  HCT 37.3* 36.9* 35.4* 36.9* 37.7*  MCV 91.0 91.1 88.1 88.7 88.5  PLT 479* 483* 449* 438* 062*   Basic Metabolic Panel: Recent Labs  Lab 05/09/20 0303 05/10/20 0426 05/11/20 0332 05/12/20 0334 05/15/20 0614  NA 135 132* 133* 134* 134*  K 4.6 4.6  4.6 4.5 4.3  CL 103 102 103 102 98  CO2 24 23 23 23 24   GLUCOSE 183* 186* 145* 134* 128*  BUN 11 13 15 12 16   CREATININE 0.71 0.73 0.66 0.64 0.99  CALCIUM 8.0* 8.1* 8.1* 8.4* 8.7*   GFR: Estimated Creatinine Clearance: 81.9 mL/min (by C-G formula based on SCr of 0.99 mg/dL). Liver Function Tests: Recent Labs  Lab 05/12/20 0334 05/15/20 0614  AST 22  26  ALT 18 20  ALKPHOS 72 118  BILITOT 0.9 1.0  PROT 4.6* 5.6*  ALBUMIN 1.7* 2.0*   No results for input(s): LIPASE, AMYLASE in the last 168 hours. No results for input(s): AMMONIA in the last 168 hours. Coagulation Profile: No results for input(s): INR, PROTIME in the last 168 hours. Cardiac Enzymes: No results for input(s): CKTOTAL, CKMB, CKMBINDEX, TROPONINI in the last 168 hours. BNP (last 3 results) No results for input(s): PROBNP in the last 8760 hours. HbA1C: No results for input(s): HGBA1C in the last 72 hours. CBG: Recent Labs  Lab 05/14/20 0634 05/14/20 1204 05/14/20 1636 05/14/20 1934 05/15/20 0647  GLUCAP 122* 119* 117* 124* 120*   Lipid Profile: No results for input(s): CHOL, HDL, LDLCALC, TRIG, CHOLHDL, LDLDIRECT in the last 72 hours. Thyroid Function Tests: No results for input(s): TSH, T4TOTAL, FREET4, T3FREE, THYROIDAB in the last 72 hours. Anemia Panel: No results for input(s): VITAMINB12, FOLATE, FERRITIN, TIBC, IRON, RETICCTPCT in the last 72 hours.    Radiology Studies: I have reviewed all of the imaging during this hospital visit personally     Scheduled Meds: . acetaminophen  650 mg Oral Q6H  . acidophilus  2 capsule Oral Daily  . bisacodyl  10 mg Rectal Once  . calcium carbonate  1 tablet Oral TID WC  . Chlorhexidine Gluconate Cloth  6 each Topical Daily  . enoxaparin (LOVENOX) injection  40 mg Subcutaneous Q24H  . feeding supplement (ENSURE ENLIVE)  237 mL Oral BID BM  . ibuprofen  400 mg Oral TID  . mouth rinse  15 mL Mouth Rinse BID  . multivitamin with minerals  1 tablet Oral Daily  . pantoprazole  40 mg Oral Daily  . polyethylene glycol  17 g Oral BID  . [START ON 05/16/2020] polyethylene glycol  17 g Oral Daily  . pravastatin  20 mg Oral q1800  . sertraline  100 mg Oral Daily  . sodium chloride flush  10-40 mL Intracatheter Q12H  . zolpidem  5 mg Oral QHS   Continuous Infusions: . sodium chloride Stopped (05/07/20 0923)  .  meropenem (MERREM) IV 1 g (05/15/20 0912)     LOS: 18 days        Delaine Hernandez Gerome Apley, MD

## 2020-05-15 NOTE — Progress Notes (Signed)
Subjective: Still with fullness.  Nausea after eating some.  No BM yet.  Drain output increased.  650cc yesterday.  ROS: See above, otherwise other systems negative  Objective: Vital signs in last 24 hours: Temp:  [97.8 F (36.6 C)-98.5 F (36.9 C)] 97.8 F (36.6 C) (09/07 0806) Pulse Rate:  [91-100] 93 (09/07 0806) Resp:  [16-18] 17 (09/07 0806) BP: (127-131)/(81-93) 131/93 (09/07 0806) SpO2:  [95 %-100 %] 97 % (09/07 0806) Last BM Date: 05/11/20  Intake/Output from previous day: 09/06 0701 - 09/07 0700 In: 240 [P.O.:240] Out: 1125 [Urine:500; Drains:625] Intake/Output this shift: No intake/output data recorded.  PE: Abd: distended, hypoactive BS, minimally tender, Gravity bag in place with bilious output.  Lab Results:  Recent Labs    05/15/20 0614  WBC 16.6*  HGB 12.1*  HCT 37.7*  PLT 441*   BMET Recent Labs    05/15/20 0614  NA 134*  K 4.3  CL 98  CO2 24  GLUCOSE 128*  BUN 16  CREATININE 0.99  CALCIUM 8.7*   PT/INR No results for input(s): LABPROT, INR in the last 72 hours. CMP     Component Value Date/Time   NA 134 (L) 05/15/2020 0614   K 4.3 05/15/2020 0614   CL 98 05/15/2020 0614   CO2 24 05/15/2020 0614   GLUCOSE 128 (H) 05/15/2020 0614   BUN 16 05/15/2020 0614   CREATININE 0.99 05/15/2020 0614   CALCIUM 8.7 (L) 05/15/2020 0614   PROT 5.6 (L) 05/15/2020 0614   ALBUMIN 2.0 (L) 05/15/2020 0614   AST 26 05/15/2020 0614   ALT 20 05/15/2020 0614   ALKPHOS 118 05/15/2020 0614   BILITOT 1.0 05/15/2020 0614   GFRNONAA >60 05/15/2020 0614   GFRAA >60 05/15/2020 0614   Lipase     Component Value Date/Time   LIPASE 20 05/03/2020 0355       Studies/Results: CT ABDOMEN PELVIS W CONTRAST  Result Date: 05/13/2020 CLINICAL DATA:  History of cholecystectomy complicated by development of a postoperative bile leak from the cystic duct remnant, post image guided placement of a percutaneous drainage catheter into the gallbladder fossa  fluid collection on 02/13/2020 (Dr. Annamaria Boots). Patient subsequently underwent placement of an internal biliary stent at 03/50/0938 which was complicated by development of postprocedural pancreatitis. Evaluate for bile leak/biloma. EXAM: CT ABDOMEN AND PELVIS WITH CONTRAST TECHNIQUE: Multidetector CT imaging of the abdomen and pelvis was performed using the standard protocol following bolus administration of intravenous contrast. CONTRAST:  12mL OMNIPAQUE IOHEXOL 300 MG/ML  SOLN COMPARISON:  CT abdomen pelvis-05/03/2020; 04/28/2020; 04/26/2020; 03/01/2020; 02/13/2020; image guided placement of gallbladder fossa drainage catheter-02/13/2020; drainage catheter injection-03/01/2020; ERCP-03/21/2020 FINDINGS: Lower chest: Limited visualization of lower thorax demonstrates small to moderate-sized left and trace right-sided pleural effusions with associated bibasilar consolidative opacities and associated air bronchograms and volume loss within the bilateral lower lobes, left greater than right, similar to the 8/26 examination. Normal heart size.  No pericardial effusion. Hepatobiliary: Normal hepatic contour. No discrete hepatic lesions. Stable positioning of percutaneous drainage catheter with end coiled and locked within the gallbladder fossa. Stable positioning of internal biliary stent without development of intrahepatic biliary ductal dilatation. Interval development of small amount of perihepatic ascites which extends to the level of the lower abdomen/pelvis. Pancreas: Redemonstrated sequela of pancreatitis with extensive inflammatory change about the pancreatic bed. There is persistent enhancement of the pancreatic parenchyma without definitive evidence of pancreatic necrosis. No definitive pancreatic ductal dilatation on this non pancreatic protocol CT  scan. Inflammatory change again extends along the anterior aspect of the left pararenal space without peripherally enhancing definable/drainable pancreatic  pseudocyst. Spleen: Normal appearance of the spleen. Adrenals/Urinary Tract: There is symmetric enhancement and excretion of the bilateral kidneys. Redemonstrated punctate bilateral nonobstructing renal stones with dominant stones within the superior poles of the bilateral kidneys measuring 0.4 cm in diameter (left-coronal image 86, series 6; right-coronal image 88, series 6). No evidence of urinary obstruction or perinephric stranding. Normal appearance the bilateral adrenal glands. Normal appearance of the urinary bladder given degree of distention. Stomach/Bowel: Moderate colonic stool burden without evidence of enteric obstruction. Scattered colonic diverticulosis without evidence of superimposed acute diverticulitis. Normal appearance of the terminal ileum and the retrocecal appendix. Mild bowel wall thickening involving the transverse colon is likely reactive secondary to adjacent inflammatory change within the pancreas. No pneumoperitoneum, pneumatosis or portal venous gas. Vascular/Lymphatic: Scattered atherosclerotic plaque. The major branch vessels of the abdominal aorta appear patent within normal caliber abdominal aorta on this non CTA examination. The peripheral aspect of the portal vein as well as the portal splenule confluence and superior aspect of the SMV and splenic vein are narrowed though remains patent. Scattered porta hepatis, gastrohepatic ligament and mesenteric lymph nodes are prominent though individually not enlarged by size criteria, presumably reactive in etiology. Reproductive: Normal appearance of the prostate gland. Small moderate amount of fluid within the lower pelvis. Other: Diffuse body wall anasarca, most conspicuous about the midline of the low back. Musculoskeletal: No acute or aggressive osseous abnormalities. Stigmata of dish within the thoracic spine. Mild-to-moderate degenerative change the bilateral hips with joint space loss, subchondral sclerosis and osteophytosis.  IMPRESSION: 1. Sequela of severe pancreatitis with extensive fluid about the pancreatic bed but without peripherally enhancing definable/drainable pancreatic pseudocyst and without definitive evidence of pancreatic necrosis. 2. Stable positioning of internal biliary stent with apparent preserved functionality as there is no intrahepatic biliary ductal dilatation. 3. Interval development of small to moderate volume intra-abdominal ascites, the etiology of which is not determined on this examination though given extensive pancreatitis and apparent functionality of the internal biliary stent, fluid may represent pancreatic enzymes. Fluid sampling with sending aspirated fluid for amylase, lipase and bilirubin levels could be performed as indicated. Alternatively, if there is concern for a bile leak, further evaluation with nuclear medicine HIDA scan could be performed as indicated. 4. Stable positioning of gallbladder fossa percutaneous drainage catheter. 5. Grossly unchanged small to moderate sized left and trace right-sided pleural effusions with associated bibasilar opacities/atelectasis. 6. Bilateral nonobstructing nephrolithiasis. Electronically Signed   By: Sandi Mariscal M.D.   On: 05/13/2020 15:41    Anti-infectives: Anti-infectives (From admission, onward)   Start     Dose/Rate Route Frequency Ordered Stop   05/04/20 1230  meropenem (MERREM) 1 g in sodium chloride 0.9 % 100 mL IVPB        1 g 200 mL/hr over 30 Minutes Intravenous Every 8 hours 05/04/20 1156     05/01/20 1400  piperacillin-tazobactam (ZOSYN) IVPB 3.375 g        3.375 g 12.5 mL/hr over 240 Minutes Intravenous Every 8 hours 05/01/20 1013 05/03/20 0907   04/29/20 2000  vancomycin (VANCOCIN) IVPB 1000 mg/200 mL premix  Status:  Discontinued        1,000 mg 200 mL/hr over 60 Minutes Intravenous Every 12 hours 04/29/20 0653 04/30/20 0831   04/29/20 0745  vancomycin (VANCOREADY) IVPB 1500 mg/300 mL        1,500 mg 150 mL/hr over  120  Minutes Intravenous  Once 04/29/20 8377 04/29/20 1129   04/26/20 2300  piperacillin-tazobactam (ZOSYN) IVPB 3.375 g        3.375 g 12.5 mL/hr over 240 Minutes Intravenous Every 8 hours 04/26/20 1716 05/01/20 0559   04/26/20 1700  piperacillin-tazobactam (ZOSYN) IVPB 3.375 g        3.375 g 100 mL/hr over 30 Minutes Intravenous  Once 04/26/20 1655 04/26/20 1844       Assessment/Plan Hx subtotal cholecystectomy - Dr. Bobbye Morton - 01/24/2020  Bile leak with ERCP and post ERCP pancreatitis - HIDA ordered yesterday by Dr. Kae Heller to assure no other leakage from anywhere else.  Prior confirmed this was from cystic duct as expected given subtotal status of chole. -unfortunately, nothing to do surgically for this.   -GI is going to discuss with Dr. Rush Landmark about what can be offered from their standpoint. -we will follow -patient still seems to be having issues with his diet tolerance  FEN - carb mod VTE - Lovenox ID - Merrem   LOS: 18 days    Henreitta Cea , [facility] 05/15/2020, 10:09 AM Please see Amion for pager number during day hours 7:00am-4:30pm or 7:00am -11:30am on weekends

## 2020-05-15 NOTE — Progress Notes (Signed)
Nutrition Follow-up  RD working remotely.  DOCUMENTATION CODES:   Not applicable  INTERVENTION:   -Continue Magic cup TID with meals, each supplement provides 290 kcal and 9 grams of protein -Continue Ensure Enlive po BID, each supplement provides 350 kcal and 20 grams of protein -Continue MVI with minerals daily  NUTRITION DIAGNOSIS:   Inadequate oral intake related to acute illness as evidenced by NPO status.  Progressing; advanced to carb modified diet on 05/09/20  GOAL:   Patient will meet greater than or equal to 90% of their needs  Progressing   MONITOR:   PO intake, Supplement acceptance, Labs, Weight trends, TF tolerance, I & O's  REASON FOR ASSESSMENT:   Ventilator    ASSESSMENT:   64 year old male with PMH of HTN, ERCP on 8/18 s/p biliary stent exchange for persistent postoperative bile leak after recent cholecystectomy with abscess s/- percutaneous drainage. CT abdomen/pelvis showing acute pancreatitis. Pt admitted with severe sepsis secondary to post-ERCP pancreatitis.  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 Plan to evaluate today for addition work-up. Also plan for HIDA scan to assess ongoing bile leak.   Attempted to speak with pt via call to hospital room phone, however, no answer.   Intake has improved; noted meal  completion 25-100%. Pt wife also bringing in outside food for pt to consume. Pt intermittently accepting of Ensure supplements.   Medications reviewed and include miralax,   Labs reviewed: Na: 134, CBGS: 117-127.   Diet Order:   Diet Order            Diet Carb Modified Fluid consistency: Thin; Room service appropriate? Yes  Diet effective now                 EDUCATION NEEDS:   Education needs have been addressed  Skin:  Skin Assessment: Skin Integrity Issues: Skin Integrity Issues:: Incisions Incisions: closed abdomen  Last BM:  05/11/20  Height:   Ht Readings from Last 1 Encounters:  05/02/20 5\' 9"  (1.753 m)    Weight:   Wt Readings from Last 1 Encounters:  05/13/20 86 kg    Ideal Body Weight:  72.7 kg  BMI:  Body mass index is 28 kg/m.  Estimated Nutritional Needs:   Kcal:  7035-0093  Protein:  130-145 grams  Fluid:  > 2.3 L    Loistine Chance, RD, LDN, [place] Registered Dietitian II Certified Diabetes Care and Education Specialist Please refer to [facility] for RD and/or RD on-call/weekend/after hours pager

## 2020-05-15 NOTE — Plan of Care (Signed)
  Problem: Education: Goal: Knowledge of General Education information will improve Description: Including pain rating scale, medication(s)/side effects and non-pharmacologic comfort measures Outcome: Progressing   Problem: Health Behavior/Discharge Planning: Goal: Ability to manage health-related needs will improve Outcome: Progressing   Problem: Clinical Measurements: Goal: Ability to maintain clinical measurements within normal limits will improve Outcome: Progressing Goal: Will remain free from infection Outcome: Progressing Goal: Diagnostic test results will improve Outcome: Progressing Goal: Respiratory complications will improve Outcome: Progressing Goal: Cardiovascular complication will be avoided Outcome: Progressing   Problem: Nutrition: Goal: Adequate nutrition will be maintained Outcome: Progressing   Problem: Coping: Goal: Level of anxiety will decrease Outcome: Progressing   Problem: Elimination: Goal: Will not experience complications related to bowel motility Outcome: Progressing Goal: Will not experience complications related to urinary retention Outcome: Progressing   Problem: Pain Managment: Goal: General experience of comfort will improve Outcome: Progressing   Problem: Pain Managment: Goal: General experience of comfort will improve Outcome: Progressing   Problem: Safety: Goal: Ability to remain free from injury will improve Outcome: Progressing   Problem: Skin Integrity: Goal: Risk for impaired skin integrity will decrease Outcome: Progressing   Problem: Education: Goal: Knowledge of disease or condition will improve Outcome: Progressing Goal: Understanding of medication regimen will improve Outcome: Progressing Goal: Individualized Educational Video(s) Outcome: Progressing   Problem: Activity: Goal: Ability to tolerate increased activity will improve Outcome: Progressing   Problem: Cardiac: Goal: Ability to achieve and maintain  adequate cardiopulmonary perfusion will improve Outcome: Progressing   Problem: Health Behavior/Discharge Planning: Goal: Ability to safely manage health-related needs after discharge will improve Outcome: Progressing   Problem: Activity: Goal: Ability to tolerate increased activity will improve Outcome: Progressing   Problem: Respiratory: Goal: Ability to maintain a clear airway and adequate ventilation will improve Outcome: Progressing   Problem: Role Relationship: Goal: Method of communication will improve Outcome: Progressing

## 2020-05-15 NOTE — Progress Notes (Signed)
Attempted to see patient but he was at HIDA. Will plan to see this afternoon after.  Justice Britain, MD [facility] Office # [phone]

## 2020-05-16 ENCOUNTER — Inpatient Hospital Stay (HOSPITAL_COMMUNITY): Payer: 59

## 2020-05-16 DIAGNOSIS — K2980 Duodenitis without bleeding: Secondary | ICD-10-CM

## 2020-05-16 DIAGNOSIS — K838 Other specified diseases of biliary tract: Secondary | ICD-10-CM

## 2020-05-16 LAB — CBC WITH DIFFERENTIAL/PLATELET
Abs Immature Granulocytes: 0.24 10*3/uL — ABNORMAL HIGH (ref 0.00–0.07)
Basophils Absolute: 0.1 10*3/uL (ref 0.0–0.1)
Basophils Relative: 1 %
Eosinophils Absolute: 0.1 10*3/uL (ref 0.0–0.5)
Eosinophils Relative: 1 %
HCT: 38.3 % — ABNORMAL LOW (ref 39.0–52.0)
Hemoglobin: 12 g/dL — ABNORMAL LOW (ref 13.0–17.0)
Immature Granulocytes: 2 %
Lymphocytes Relative: 6 %
Lymphs Abs: 0.9 10*3/uL (ref 0.7–4.0)
MCH: 27.6 pg (ref 26.0–34.0)
MCHC: 31.3 g/dL (ref 30.0–36.0)
MCV: 88 fL (ref 80.0–100.0)
Monocytes Absolute: 0.8 10*3/uL (ref 0.1–1.0)
Monocytes Relative: 5 %
Neutro Abs: 13.9 10*3/uL — ABNORMAL HIGH (ref 1.7–7.7)
Neutrophils Relative %: 85 %
Platelets: 360 10*3/uL (ref 150–400)
RBC: 4.35 MIL/uL (ref 4.22–5.81)
RDW: 14 % (ref 11.5–15.5)
WBC: 16 10*3/uL — ABNORMAL HIGH (ref 4.0–10.5)
nRBC: 0 % (ref 0.0–0.2)

## 2020-05-16 LAB — SEDIMENTATION RATE: Sed Rate: 36 mm/hr — ABNORMAL HIGH (ref 0–16)

## 2020-05-16 LAB — COMPREHENSIVE METABOLIC PANEL
ALT: 24 U/L (ref 0–44)
AST: 37 U/L (ref 15–41)
Albumin: 2 g/dL — ABNORMAL LOW (ref 3.5–5.0)
Alkaline Phosphatase: 424 U/L — ABNORMAL HIGH (ref 38–126)
Anion gap: 12 (ref 5–15)
BUN: 18 mg/dL (ref 8–23)
CO2: 24 mmol/L (ref 22–32)
Calcium: 8.5 mg/dL — ABNORMAL LOW (ref 8.9–10.3)
Chloride: 99 mmol/L (ref 98–111)
Creatinine, Ser: 0.92 mg/dL (ref 0.61–1.24)
GFR calc Af Amer: >60 mL/min (ref >60)
GFR calc non Af Amer: >60 mL/min (ref >60)
Glucose, Bld: 121 mg/dL — ABNORMAL HIGH (ref 70–99)
Potassium: 3.9 mmol/L (ref 3.5–5.1)
Sodium: 135 mmol/L (ref 135–145)
Total Bilirubin: 1.5 mg/dL — ABNORMAL HIGH (ref 0.3–1.2)
Total Protein: 5.6 g/dL — ABNORMAL LOW (ref 6.5–8.1)

## 2020-05-16 LAB — LIPASE, BLOOD: Lipase: 27 U/L (ref 11–51)

## 2020-05-16 LAB — C-REACTIVE PROTEIN: CRP: 12.3 mg/dL — ABNORMAL HIGH (ref <1.0)

## 2020-05-16 LAB — PROTIME-INR
INR: 1.3 — ABNORMAL HIGH (ref 0.8–1.2)
Prothrombin Time: 16.1 seconds — ABNORMAL HIGH (ref 11.4–15.2)

## 2020-05-16 LAB — PREALBUMIN: Prealbumin: 10.3 mg/dL — ABNORMAL LOW (ref 18–38)

## 2020-05-16 MED ORDER — ENOXAPARIN SODIUM 40 MG/0.4ML ~~LOC~~ SOLN
40.0000 mg | SUBCUTANEOUS | Status: DC
  Administered 2020-05-18 – 2020-05-27 (×10): 40 mg via SUBCUTANEOUS
  Filled 2020-05-16 (×12): qty 0.4

## 2020-05-16 MED ORDER — OXYCODONE HCL 5 MG PO TABS
5.0000 mg | ORAL_TABLET | ORAL | Status: DC | PRN
  Administered 2020-05-16 – 2020-05-29 (×10): 5 mg via ORAL
  Filled 2020-05-16 (×11): qty 1

## 2020-05-16 MED ORDER — DEXTROSE IN LACTATED RINGERS 5 % IV SOLN: INTRAVENOUS | Status: DC

## 2020-05-16 MED ORDER — METOCLOPRAMIDE HCL 5 MG/ML IJ SOLN
5.0000 mg | Freq: Three times a day (TID) | INTRAMUSCULAR | Status: DC
  Administered 2020-05-16 – 2020-05-21 (×13): 5 mg via INTRAVENOUS
  Filled 2020-05-16 (×13): qty 2

## 2020-05-16 MED ORDER — IBUPROFEN 200 MG PO TABS: 400.0000 mg | ORAL_TABLET | Freq: Four times a day (QID) | ORAL | Status: DC | PRN

## 2020-05-16 MED ORDER — HYDROMORPHONE HCL 1 MG/ML IJ SOLN
1.0000 mg | INTRAMUSCULAR | Status: DC | PRN
  Administered 2020-05-16 – 2020-05-30 (×45): 1 mg via INTRAVENOUS
  Administered 2020-05-31: 0.5 mg via INTRAVENOUS
  Administered 2020-05-31 (×3): 1 mg via INTRAVENOUS
  Filled 2020-05-16 (×52): qty 1

## 2020-05-16 NOTE — Progress Notes (Signed)
PROGRESS NOTE    Adam Fowler  BZJ:[id number] DOB: 1955/09/29 DOA: 04/26/2020 PCP: Melony Overly, MD    Brief Narrative:  Patient admitted to the hospital working diagnosis of post ERCP acute pancreatitis.Prolonged hospitalization with multiple complications, including respiratory failure (required mechanical ventilation), AKI, encephalopathy and atrial fibrillation.  64 year old male with past medical history significant for hypertension. Heunderwent ERCP on 8/18 and biliary stent exchange for persistent postoperative bile leak,related to recent cholecystectomy with abscess status post percutaneous drainage. Patient developed severe epigastric abdominal pain, associated with nausea, and vomiting after ERCP procedure. On his initial physical examination blood pressure 137/88, heart rate 114, respiratoryrate23, oxygen saturation 92%,his lungs were clear to auscultation bilaterally, heart S1-S2, present, tachycardic, his abdomen was tender to palpation,itwas distended, no rebound or guarding,no lower extremity edema.  CT of the abdomen pelvis show changes consistent with acute pancreatitis, no abscess or drainable fluid collection. Patient was admitted to the medical ward, he received supportive medical therapy including intravenous fluids, analgesics and IV antibiotics.   His hospitalization has been complicated by metabolic encephalopathy,acute hypoxicrespiratory failure, renal failure, new onset paroxysmal atrial fibrillation and acute ileus.  08/21-08/22 rapid response due to increase work of breathing and atrial fibrillation, required invasive mechanical ventilation. He was treated for volume overload and aspiration pneumonia. Received diuresis and antibiotic therapy with Zosyn.   Liberated from mechanical ventilation on 08/24 and transitioned to nasal cannula with good toleration.   Patient continue to have leukocytosis and abdominal pain, repeat CT of the abdomen  pelvis(8/25)markedly diffuse pancreatic inflammation with fat stranding and free fluid.  His antibiotic therapy was switched from Zosyn to meropenem on 8/27. Hewasplaced on tube feeds via NG tubefor several days.  On 09/03 Ng tube was removed, patient tolerating nutritional support with by mouth intake.  Slowly patient improving, but continue to have high output biliary drain, despite stent in appropriate location and stable liver enzymes.  09/05. CT abdomen with sings of sequela of severe pancreatitis. Stable positioning of internal biliary stent with no intrahepatic biliary duct dilatation.   HIDA scan from 09/07 with persistent bile leak accumulating in the lateral aspect of the right upper quadrant, paracolic gutter.   Patient with very poor oral intake.   Assessment & Plan:   Principal Problem:   Post-ERCP acute pancreatitis Active Problems:   Severe sepsis (HCC)   Polycythemia   AKI (acute kidney injury) ([place])   Metabolic acidosis   Acute respiratory failure with hypoxia (HCC)   Bilateral pleural effusion   Ileus (HCC)   Postoperative bile leak   1. Acute post ERCP pancreatitis/ ileus. patient with positive leak per HIDA scan, his po intake has been very poor. Positive post prandial vomiting. His abdominal pain has improved with oral oxycodone.   Plan for new stent placement in am. For now will change diet to soft and will use metoclopramide before meals. Will check abdominal film to rule out ileus. If patient continue with poor oral intake, will need to replace NG tube for tube feedings.   Continue antibiotic therapy withmeropenem#13/14. Will check cell count in am.    Continue pain control with acetaminophen and oxycodone, as needed IR oxycodone and  and hydromorphone. Will change ibuprofen to as needed to prevent side effects.  2. Acute hypoxic respiratory failure/ bilateral pleural effusions more left than right, likely transudate.  Continue oxymetry  monitoring and incentive spirometer as tolerated.   3. AKIwith hypokalemia. Follow renal function in am, patient with very poor oral intake will add  slow rate of balanced electrolyte solutions with dextrose. Patient with low oncotic pressure and risk for worsening peripheral edema.   4. Paroxysmal atrial fibrillation/ transitory.Converted to simus rhythm. Has a low risk of thrombosis CHADS vasc score of 1. Will hold on antithrombotic therapy for now. Now off telemetry for now.   6. Anemiaof chronic disease. hgb and hct have been stable.   7. Acute metabolic encephalopathy/ depression and anxiety.Continue with sertraline. Now that pain is better controlled his anxiety has improved. Added trazodone for sleep at night.   8. HTN/ dyslipidemia.( [place] on lisinopril and hctz combination).Continue holding antihypertensive medications. Continue with pravastatin.   11. Transitory hyperglycemia. Hgb A1c 5.6.patient with very poor oral intake, will add dextrose to IV fluids for now with close monitoring of glucose.   12. Moderate calorie protein malnutrition.On nutritional supplementation.Very poor oral intake, may need to resume tube feedings.     Patient continue to be at high risk for worsening bile leak   Status is: Inpatient  Remains inpatient appropriate because:IV treatments appropriate due to intensity of illness or inability to take PO and Inpatient level of care appropriate due to severity of illness   Dispo:  Patient From: Home  Planned Disposition: home health with [facility]   Expected discharge date: 05/20/20  Medically stable for discharge: No    DVT prophylaxis: Enoxaparin   Code Status:   full  Family Communication:  No family at the bedside      Consultants:   GI   IR    Antimicrobials:   Meropenem.     Subjective: Patient reports improved in abdominal pain, but now has nausea and vomiting, not able to keep his meals. No dyspnea or chest pain  but positive fatigue. He continues to have copious drainage from his biliary leak   Objective: Vitals:   05/15/20 1425 05/15/20 2044 05/16/20 0416 05/16/20 0749  BP: (!) 132/91 124/79 132/86 129/85  Pulse: 82 81 93 92  Resp: 16 16 18 16   Temp: (!) 97.5 F (36.4 C) 97.7 F (36.5 C) 98 F (36.7 C) 98.2 F (36.8 C)  TempSrc: Oral Oral Oral Oral  SpO2: 97% 97% 97% 97%  Weight:      Height:        Intake/Output Summary (Last 24 hours) at 05/16/2020 1234 Last data filed at 05/16/2020 0800 Gross per 24 hour  Intake 360 ml  Output --  Net 360 ml   Filed Weights   05/07/20 0500 05/08/20 0357 05/13/20 0306  Weight: 86.6 kg 86.9 kg 86 kg    Examination:   General: deconditioned and ill looking appearing.  Neurology: Awake and alert, non focal  E ENT: mild pallor, no icterus, oral mucosa moist Cardiovascular: No JVD. S1-S2 present, rhythmic, no gallops, rubs, or murmurs. ++ pitting bilateral lower extremity edema. Pulmonary: positive breath sounds bilaterally,decreased air movement, no wheezing, rhonchi or rales. Gastrointestinal. Abdomen distended and tympanic , no rebound or guarding.  Skin. No rashes Musculoskeletal: no joint deformities     Data Reviewed: I have personally reviewed following labs and imaging studies  CBC: Recent Labs  Lab 05/10/20 0426 05/11/20 0332 05/11/20 1712 05/15/20 0614  WBC 18.3* 15.1* 15.5* 16.6*  NEUTROABS 14.4* 12.2* 12.3*  --   HGB 11.6* 11.3* 11.7* 12.1*  HCT 36.9* 35.4* 36.9* 37.7*  MCV 91.1 88.1 88.7 88.5  PLT 483* 449* 438* 277*   Basic Metabolic Panel: Recent Labs  Lab 05/10/20 0426 05/11/20 0332 05/12/20 0334 05/15/20 0614  NA 132*  133* 134* 134*  K 4.6 4.6 4.5 4.3  CL 102 103 102 98  CO2 23 23 23 24   GLUCOSE 186* 145* 134* 128*  BUN 13 15 12 16   CREATININE 0.73 0.66 0.64 0.99  CALCIUM 8.1* 8.1* 8.4* 8.7*   GFR: Estimated Creatinine Clearance: 81.9 mL/min (by C-G formula based on SCr of 0.99 mg/dL). Liver Function  Tests: Recent Labs  Lab 05/12/20 0334 05/15/20 0614  AST 22 26  ALT 18 20  ALKPHOS 72 118  BILITOT 0.9 1.0  PROT 4.6* 5.6*  ALBUMIN 1.7* 2.0*   No results for input(s): LIPASE, AMYLASE in the last 168 hours. No results for input(s): AMMONIA in the last 168 hours. Coagulation Profile: No results for input(s): INR, PROTIME in the last 168 hours. Cardiac Enzymes: No results for input(s): CKTOTAL, CKMB, CKMBINDEX, TROPONINI in the last 168 hours. BNP (last 3 results) No results for input(s): PROBNP in the last 8760 hours. HbA1C: No results for input(s): HGBA1C in the last 72 hours. CBG: Recent Labs  Lab 05/14/20 0634 05/14/20 1204 05/14/20 1636 05/14/20 1934 05/15/20 0647  GLUCAP 122* 119* 117* 124* 120*   Lipid Profile: No results for input(s): CHOL, HDL, LDLCALC, TRIG, CHOLHDL, LDLDIRECT in the last 72 hours. Thyroid Function Tests: No results for input(s): TSH, T4TOTAL, FREET4, T3FREE, THYROIDAB in the last 72 hours. Anemia Panel: No results for input(s): VITAMINB12, FOLATE, FERRITIN, TIBC, IRON, RETICCTPCT in the last 72 hours.    Radiology Studies: I have reviewed all of the imaging during this hospital visit personally     Scheduled Meds: . acetaminophen  650 mg Oral Q6H  . acidophilus  2 capsule Oral Daily  . calcium carbonate  1 tablet Oral TID WC  . Chlorhexidine Gluconate Cloth  6 each Topical Daily  . [START ON 05/18/2020] enoxaparin (LOVENOX) injection  40 mg Subcutaneous Q24H  . feeding supplement (ENSURE ENLIVE)  237 mL Oral BID BM  . ibuprofen  400 mg Oral TID  . mouth rinse  15 mL Mouth Rinse BID  . multivitamin with minerals  1 tablet Oral Daily  . oxyCODONE  10 mg Oral Q12H  . pantoprazole  40 mg Oral Daily  . polyethylene glycol  17 g Oral Daily  . pravastatin  20 mg Oral q1800  . sertraline  100 mg Oral Daily  . sodium chloride flush  10-40 mL Intracatheter Q12H  . traZODone  50 mg Oral QHS   Continuous Infusions: . sodium chloride  Stopped (05/07/20 0923)  . meropenem (MERREM) IV 1 g (05/16/20 0936)     LOS: 19 days        Devian Bartolomei Gerome Apley, MD

## 2020-05-16 NOTE — Plan of Care (Signed)
  Problem: Safety: Goal: Ability to remain free from injury will improve Outcome: Progressing   

## 2020-05-16 NOTE — Progress Notes (Signed)
Biliary drain leaking at site, Dr. Cathlean Sauer aware. Dressing applied with gauze, abd, and tape. Will continue to monitor.

## 2020-05-16 NOTE — Progress Notes (Addendum)
Subjective: Patient continues to have nausea and had emesis again yesterday trying to eat.  Still bloated.  Small amount of flatus, but no BM.  Drain still with bilious output.  ROS: See above, otherwise other systems negative  Objective: Vital signs in last 24 hours: Temp:  [97.5 F (36.4 C)-98 F (36.7 C)] 98 F (36.7 C) (09/08 0416) Pulse Rate:  [81-93] 93 (09/08 0416) Resp:  [16-18] 18 (09/08 0416) BP: (124-132)/(79-93) 132/86 (09/08 0416) SpO2:  [97 %] 97 % (09/08 0416) Last BM Date:  (pt states 5 weeks ago)  Intake/Output from previous day: No intake/output data recorded. Intake/Output this shift: No intake/output data recorded.  PE: Abd: distended, RUQ drain in place, cap left open and bile spilt out on the floor so bag now empty.  No output documented yesterday so unsure who much in last 24 hrs.  Hypoactive BS  Lab Results:  Recent Labs    05/15/20 0614  WBC 16.6*  HGB 12.1*  HCT 37.7*  PLT 441*   BMET Recent Labs    05/15/20 0614  NA 134*  K 4.3  CL 98  CO2 24  GLUCOSE 128*  BUN 16  CREATININE 0.99  CALCIUM 8.7*   PT/INR No results for input(s): LABPROT, INR in the last 72 hours. CMP     Component Value Date/Time   NA 134 (L) 05/15/2020 0614   K 4.3 05/15/2020 0614   CL 98 05/15/2020 0614   CO2 24 05/15/2020 0614   GLUCOSE 128 (H) 05/15/2020 0614   BUN 16 05/15/2020 0614   CREATININE 0.99 05/15/2020 0614   CALCIUM 8.7 (L) 05/15/2020 0614   PROT 5.6 (L) 05/15/2020 0614   ALBUMIN 2.0 (L) 05/15/2020 0614   AST 26 05/15/2020 0614   ALT 20 05/15/2020 0614   ALKPHOS 118 05/15/2020 0614   BILITOT 1.0 05/15/2020 0614   GFRNONAA >60 05/15/2020 0614   GFRAA >60 05/15/2020 0614   Lipase     Component Value Date/Time   LIPASE 20 05/03/2020 0355       Studies/Results: NM HEPATO BILIARY LEAK  Result Date: 05/15/2020 CLINICAL DATA:  Recurrent right upper quadrant abdominal pain. Status post cholecystectomy. Evaluate for bile leak.  EXAM: NUCLEAR MEDICINE HEPATOBILIARY IMAGING TECHNIQUE: Sequential images of the abdomen were obtained out to 60 minutes following intravenous administration of radiopharmaceutical. RADIOPHARMACEUTICALS:  5.4 mCi Tc-61m  Choletec IV COMPARISON:  Abdomen/pelvis CT 05/13/2020. Nuclear scintigraphy 02/03/2020. FINDINGS: Prompt uptake and biliary excretion of activity by the liver is seen. Blood pool activity clears rapidly. Biliary activity evident by 25 minutes. No definite common bile duct activity by 60 minutes. No gut activity on today's study. Radiotracer accumulation at the inferior tip of the liver against a 25 minutes and progresses through the 60 minutes of the exam. This accumulates along the lateral right abdominal wall, inferior to the liver, likely in the paracolic gutter. Patient's biliary drain was unclamped and anterior imaging was obtained 5 minutes later, demonstrating radiotracer within the tubing of the drain. IMPRESSION: Imaging features consistent with persistent bile leak accumulating in the lateral aspect of the right upper quadrant, likely in the paracolic gutter. After unclamping the biliary drain, radiotracer is visible within the drain tubing. Electronically Signed   By: Misty Stanley M.D.   On: 05/15/2020 12:29    Anti-infectives: Anti-infectives (From admission, onward)   Start     Dose/Rate Route Frequency Ordered Stop   05/04/20 1230  meropenem (MERREM) 1 g in  sodium chloride 0.9 % 100 mL IVPB        1 g 200 mL/hr over 30 Minutes Intravenous Every 8 hours 05/04/20 1156     05/01/20 1400  piperacillin-tazobactam (ZOSYN) IVPB 3.375 g        3.375 g 12.5 mL/hr over 240 Minutes Intravenous Every 8 hours 05/01/20 1013 05/03/20 0907   04/29/20 2000  vancomycin (VANCOCIN) IVPB 1000 mg/200 mL premix  Status:  Discontinued        1,000 mg 200 mL/hr over 60 Minutes Intravenous Every 12 hours 04/29/20 0653 04/30/20 0831   04/29/20 0745  vancomycin (VANCOREADY) IVPB 1500 mg/300 mL         1,500 mg 150 mL/hr over 120 Minutes Intravenous  Once 04/29/20 0653 04/29/20 1129   04/26/20 2300  piperacillin-tazobactam (ZOSYN) IVPB 3.375 g        3.375 g 12.5 mL/hr over 240 Minutes Intravenous Every 8 hours 04/26/20 1716 05/01/20 0559   04/26/20 1700  piperacillin-tazobactam (ZOSYN) IVPB 3.375 g        3.375 g 100 mL/hr over 30 Minutes Intravenous  Once 04/26/20 1655 04/26/20 1844       Assessment/Plan Hx subtotal cholecystectomy - Dr. Bobbye Morton - 01/24/2020  Bile leak with ERCP and post ERCP pancreatitis - HIDA showed persistent bile leak as expected -appreciate GI input with plans for tentative ERCP again on Thursday. -if patient fails to improve, will have to get our HB specialist involved if he were to need some type of bypass. -concerned about his lack of oral intake.  Given he has persistent N/V, he may require Cortrak to be replaced for nutrition as he is getting minimal to none at this time it seems.  Will defer to GI and medicine, but will order a prealbumin.  His last albumin level was only 2 so I suspect his prealbumin is going to be low, but will see. -suspect his intolerance is related to his pancreatitis as his last CT scan does not show evidence of an ileus. -will continue to follow  FEN - carb mod VTE - Lovenox ID - Merrem   LOS: 19 days    Henreitta Cea , [facility] 05/16/2020, 7:47 AM Please see Amion for pager number during day hours 7:00am-4:30pm or 7:00am -11:30am on weekends

## 2020-05-16 NOTE — Progress Notes (Addendum)
Daily Rounding Note  05/16/2020, 11:30 AM  LOS: 19 days   SUBJECTIVE:   Chief complaint: Post ERCP pancreatitis.  Bile leak.      Patient not tolerating oral intake well.  Vomiting up bilious material when consuming small to moderate amounts. Slowly getting stronger but still exhausted by minor activity such as showering and walking in the hallway.  OBJECTIVE:         Vital signs in last 24 hours:    Temp:  [97.5 F (36.4 C)-98.2 F (36.8 C)] 98.2 F (36.8 C) (09/08 0749) Pulse Rate:  [81-93] 92 (09/08 0749) Resp:  [16-18] 16 (09/08 0749) BP: (124-132)/(79-91) 129/85 (09/08 0749) SpO2:  [97 %] 97 % (09/08 0749) Last BM Date:  (pt states 5 weeks ago) [employer]   05/07/20 0500 05/08/20 0357 05/13/20 0306  Weight: 86.6 kg 86.9 kg 86 kg   General: Looks better but tired, ill.  Not toxic.  Comfortable. Heart: RRR. Chest: Decreased breath sounds but clear bilaterally.  No labored breathing or cough Abdomen: Soft.  Active bowel sounds.  Not tender.  Not distended. Extremities: No CCE. Neuro/Psych: Although his affect is better, he still remains depressed overall and his appearance.  Fully alert and oriented.  Intake/Output from previous day: No intake/output data recorded.  Intake/Output this shift: Total I/O In: 360 [P.O.:360] Out: -   Lab Results: Recent Labs    05/15/20 0614  WBC 16.6*  HGB 12.1*  HCT 37.7*  PLT 441*   BMET Recent Labs    05/15/20 0614  NA 134*  K 4.3  CL 98  CO2 24  GLUCOSE 128*  BUN 16  CREATININE 0.99  CALCIUM 8.7*   LFT Recent Labs    05/15/20 0614  PROT 5.6*  ALBUMIN 2.0*  AST 26  ALT 20  ALKPHOS 118  BILITOT 1.0   PT/INR No results for input(s): LABPROT, INR in the last 72 hours. Hepatitis Panel No results for input(s): HEPBSAG, HCVAB, HEPAIGM, HEPBIGM in the last 72 hours.  Studies/Results: NM HEPATO BILIARY LEAK  Result Date: 05/15/2020 CLINICAL DATA:   Recurrent right upper quadrant abdominal pain. Status post cholecystectomy. Evaluate for bile leak. EXAM: NUCLEAR MEDICINE HEPATOBILIARY IMAGING TECHNIQUE: Sequential images of the abdomen were obtained out to 60 minutes following intravenous administration of radiopharmaceutical. RADIOPHARMACEUTICALS:  5.4 mCi Tc-37m  Choletec IV COMPARISON:  Abdomen/pelvis CT 05/13/2020. Nuclear scintigraphy 02/03/2020. FINDINGS: Prompt uptake and biliary excretion of activity by the liver is seen. Blood pool activity clears rapidly. Biliary activity evident by 25 minutes. No definite common bile duct activity by 60 minutes. No gut activity on today's study. Radiotracer accumulation at the inferior tip of the liver against a 25 minutes and progresses through the 60 minutes of the exam. This accumulates along the lateral right abdominal wall, inferior to the liver, likely in the paracolic gutter. Patient's biliary drain was unclamped and anterior imaging was obtained 5 minutes later, demonstrating radiotracer within the tubing of the drain. IMPRESSION: Imaging features consistent with persistent bile leak accumulating in the lateral aspect of the right upper quadrant, likely in the paracolic gutter. After unclamping the biliary drain, radiotracer is visible within the drain tubing. Electronically Signed   By: Misty Stanley M.D.   On: 05/15/2020 12:29   Scheduled Meds: . acetaminophen  650 mg Oral Q6H  . acidophilus  2 capsule Oral Daily  . calcium carbonate  1 tablet Oral TID WC  . Chlorhexidine Gluconate  Cloth  6 each Topical Daily  . [START ON 05/18/2020] enoxaparin (LOVENOX) injection  40 mg Subcutaneous Q24H  . feeding supplement (ENSURE ENLIVE)  237 mL Oral BID BM  . ibuprofen  400 mg Oral TID  . mouth rinse  15 mL Mouth Rinse BID  . multivitamin with minerals  1 tablet Oral Daily  . oxyCODONE  10 mg Oral Q12H  . pantoprazole  40 mg Oral Daily  . polyethylene glycol  17 g Oral Daily  . pravastatin  20 mg Oral  q1800  . sertraline  100 mg Oral Daily  . sodium chloride flush  10-40 mL Intracatheter Q12H  . traZODone  50 mg Oral QHS   Continuous Infusions: . sodium chloride Stopped (05/07/20 0923)  . meropenem (MERREM) IV 1 g (05/16/20 0936)   PRN Meds:.HYDROmorphone (DILAUDID) injection, ipratropium-albuterol, lidocaine, metoprolol tartrate, oxyCODONE, polyvinyl alcohol, promethazine, sodium chloride flush   ASSESMENT:   *   Persistent bile leak despite ERCP and stenting on 2 separate occasions.  *    Post ERCP pancreatitis. Day 13 meropenem  *   Protein calorie malnutrition.  Not tolerating p.o. intake. Had core track in place 8/27 through 9/3.  Early on tube feeds associated with abdominal pain but was able to restart and reach goal rates before it was discontinued.  *   Back pain.  Note that the patient is receiving ibuprofen 400 mg tid since 9/1.    PLAN   *   ERCP w likely stent replacement 1 PM tmrw.    *   Agree that the patient may need to have core track feeding tube replaced.  *   Hold tomorrow morning's dose of Lovenox.  *    ?  Should we stop the scheduled ibuprofen and change it to as needed?      Adam Fowler  05/16/2020, 11:30 AM Phone [phone]

## 2020-05-16 NOTE — H&P (View-Only) (Signed)
Daily Rounding Note  05/16/2020, 11:30 AM  LOS: 19 days   SUBJECTIVE:   Chief complaint: Post ERCP pancreatitis.  Bile leak.      Patient not tolerating oral intake well.  Vomiting up bilious material when consuming small to moderate amounts. Slowly getting stronger but still exhausted by minor activity such as showering and walking in the hallway.  OBJECTIVE:         Vital signs in last 24 hours:    Temp:  [97.5 F (36.4 C)-98.2 F (36.8 C)] 98.2 F (36.8 C) (09/08 0749) Pulse Rate:  [81-93] 92 (09/08 0749) Resp:  [16-18] 16 (09/08 0749) BP: (124-132)/(79-91) 129/85 (09/08 0749) SpO2:  [97 %] 97 % (09/08 0749) Last BM Date:  (pt states 5 weeks ago) [employer]   05/07/20 0500 05/08/20 0357 05/13/20 0306  Weight: 86.6 kg 86.9 kg 86 kg   General: Looks better but tired, ill.  Not toxic.  Comfortable. Heart: RRR. Chest: Decreased breath sounds but clear bilaterally.  No labored breathing or cough Abdomen: Soft.  Active bowel sounds.  Not tender.  Not distended. Extremities: No CCE. Neuro/Psych: Although his affect is better, he still remains depressed overall and his appearance.  Fully alert and oriented.  Intake/Output from previous day: No intake/output data recorded.  Intake/Output this shift: Total I/O In: 360 [P.O.:360] Out: -   Lab Results: Recent Labs    05/15/20 0614  WBC 16.6*  HGB 12.1*  HCT 37.7*  PLT 441*   BMET Recent Labs    05/15/20 0614  NA 134*  K 4.3  CL 98  CO2 24  GLUCOSE 128*  BUN 16  CREATININE 0.99  CALCIUM 8.7*   LFT Recent Labs    05/15/20 0614  PROT 5.6*  ALBUMIN 2.0*  AST 26  ALT 20  ALKPHOS 118  BILITOT 1.0   PT/INR No results for input(s): LABPROT, INR in the last 72 hours. Hepatitis Panel No results for input(s): HEPBSAG, HCVAB, HEPAIGM, HEPBIGM in the last 72 hours.  Studies/Results: NM HEPATO BILIARY LEAK  Result Date: 05/15/2020 CLINICAL DATA:   Recurrent right upper quadrant abdominal pain. Status post cholecystectomy. Evaluate for bile leak. EXAM: NUCLEAR MEDICINE HEPATOBILIARY IMAGING TECHNIQUE: Sequential images of the abdomen were obtained out to 60 minutes following intravenous administration of radiopharmaceutical. RADIOPHARMACEUTICALS:  5.4 mCi Tc-82m  Choletec IV COMPARISON:  Abdomen/pelvis CT 05/13/2020. Nuclear scintigraphy 02/03/2020. FINDINGS: Prompt uptake and biliary excretion of activity by the liver is seen. Blood pool activity clears rapidly. Biliary activity evident by 25 minutes. No definite common bile duct activity by 60 minutes. No gut activity on today's study. Radiotracer accumulation at the inferior tip of the liver against a 25 minutes and progresses through the 60 minutes of the exam. This accumulates along the lateral right abdominal wall, inferior to the liver, likely in the paracolic gutter. Patient's biliary drain was unclamped and anterior imaging was obtained 5 minutes later, demonstrating radiotracer within the tubing of the drain. IMPRESSION: Imaging features consistent with persistent bile leak accumulating in the lateral aspect of the right upper quadrant, likely in the paracolic gutter. After unclamping the biliary drain, radiotracer is visible within the drain tubing. Electronically Signed   By: Misty Stanley M.D.   On: 05/15/2020 12:29   Scheduled Meds: . acetaminophen  650 mg Oral Q6H  . acidophilus  2 capsule Oral Daily  . calcium carbonate  1 tablet Oral TID WC  . Chlorhexidine Gluconate  Cloth  6 each Topical Daily  . [START ON 05/18/2020] enoxaparin (LOVENOX) injection  40 mg Subcutaneous Q24H  . feeding supplement (ENSURE ENLIVE)  237 mL Oral BID BM  . ibuprofen  400 mg Oral TID  . mouth rinse  15 mL Mouth Rinse BID  . multivitamin with minerals  1 tablet Oral Daily  . oxyCODONE  10 mg Oral Q12H  . pantoprazole  40 mg Oral Daily  . polyethylene glycol  17 g Oral Daily  . pravastatin  20 mg Oral  q1800  . sertraline  100 mg Oral Daily  . sodium chloride flush  10-40 mL Intracatheter Q12H  . traZODone  50 mg Oral QHS   Continuous Infusions: . sodium chloride Stopped (05/07/20 0923)  . meropenem (MERREM) IV 1 g (05/16/20 0936)   PRN Meds:.HYDROmorphone (DILAUDID) injection, ipratropium-albuterol, lidocaine, metoprolol tartrate, oxyCODONE, polyvinyl alcohol, promethazine, sodium chloride flush   ASSESMENT:   *   Persistent bile leak despite ERCP and stenting on 2 separate occasions.  *    Post ERCP pancreatitis. Day 13 meropenem  *   Protein calorie malnutrition.  Not tolerating p.o. intake. Had core track in place 8/27 through 9/3.  Early on tube feeds associated with abdominal pain but was able to restart and reach goal rates before it was discontinued.  *   Back pain.  Note that the patient is receiving ibuprofen 400 mg tid since 9/1.    PLAN   *   ERCP w likely stent replacement 1 PM tmrw.    *   Agree that the patient may need to have core track feeding tube replaced.  *   Hold tomorrow morning's dose of Lovenox.  *    ?  Should we stop the scheduled ibuprofen and change it to as needed?      Azucena Freed  05/16/2020, 11:30 AM Phone [phone]

## 2020-05-16 NOTE — Progress Notes (Addendum)
Physical Therapy Treatment Patient Details Name: Adam Fowler MRN: [id number] DOB: 1956/06/20 Today's Date: 05/16/2020    History of Present Illness Pt is a 64 yo male who presents to the ED with severe abdominal pain, nausea and vomiting. Found to have post ERCP acute pancreatits. Pt developed Hypoxic resp failure and was Intubated 8/22  and extubated 8/24. Pt also with new onset a fib and acute ileus. PMH includes HTN and ERCP.     PT Comments    Pt progressing well towards his physical therapy goals. Ambulating 120 feet with a cane and up to min assist. Displays decreased dynamic balance and endurance. D/c plan remains appropriate.    Follow Up Recommendations  Home health PT;Supervision for mobility/OOB     Equipment Recommendations  None recommended (has needed DME)   Recommendations for Other Services       Precautions / Restrictions Precautions Precautions: Fall Precaution Comments: abdominal drain, watch HR Restrictions Weight Bearing Restrictions: No    Mobility  Bed Mobility               General bed mobility comments: Seated on couch upon arrival  Transfers Overall transfer level: Needs assistance Equipment used: Straight cane Transfers: Sit to/from Stand Sit to Stand: Min guard         General transfer comment: Min guard for safety from lower surface of couch  Ambulation/Gait Ambulation/Gait assistance: Min assist Gait Distance (Feet): 120 Feet Assistive device: Straight cane;IV Pole Gait Pattern/deviations: Step-through pattern;Decreased stride length;Trunk flexed Gait velocity: decreased   General Gait Details: MinA for stability when fatigued, increased trunk flexion, no overt LOB   Stairs             Wheelchair Mobility    Modified Rankin (Stroke Patients Only)       Balance Overall balance assessment: Needs assistance Sitting-balance support: Feet supported;No upper extremity supported Sitting balance-Leahy Scale: Good      Standing balance support: Bilateral upper extremity supported;Single extremity supported;During functional activity Standing balance-Leahy Scale: Fair                              Cognition Arousal/Alertness: Awake/alert Behavior During Therapy: WFL for tasks assessed/performed Overall Cognitive Status: Impaired/Different from baseline Area of Impairment: Attention;Memory;Following commands;Safety/judgement;Problem solving                   Current Attention Level: Sustained Memory: Decreased short-term memory Following Commands: Follows one step commands with increased time Safety/Judgement: Decreased awareness of safety;Decreased awareness of deficits   Problem Solving: Difficulty sequencing;Slow processing;Requires verbal cues;Requires tactile cues        Exercises      General Comments        Pertinent Vitals/Pain Pain Assessment: Faces Faces Pain Scale: No hurt    Home Living                      Prior Function            PT Goals (current goals can now be found in the care plan section) Acute Rehab PT Goals Patient Stated Goal: to walk PT Goal Formulation: With patient Time For Goal Achievement: 05/30/20 Potential to Achieve Goals: Good Progress towards PT goals: Progressing toward goals    Frequency    Min 3X/week      PT Plan Current plan remains appropriate    Co-evaluation  AM-PAC PT "6 Clicks" Mobility   Outcome Measure  Help needed turning from your back to your side while in a flat bed without using bedrails?: None Help needed moving from lying on your back to sitting on the side of a flat bed without using bedrails?: None Help needed moving to and from a bed to a chair (including a wheelchair)?: A Little Help needed standing up from a chair using your arms (e.g., wheelchair or bedside chair)?: A Little Help needed to walk in hospital room?: A Little Help needed climbing 3-5 steps with a  railing? : A Little 6 Click Score: 20    End of Session Equipment Utilized During Treatment: Gait belt Activity Tolerance: Patient tolerated treatment well Patient left: in chair;with call bell/phone within reach Nurse Communication: Mobility status PT Visit Diagnosis: Unsteadiness on feet (R26.81);Muscle weakness (generalized) (M62.81);Difficulty in walking, not elsewhere classified (R26.2)     Time: 1275-1700 PT Time Calculation (min) (ACUTE ONLY): 19 min  Charges:  $Therapeutic Activity: 8-22 mins                       Wyona Almas, PT, DPT Acute Rehabilitation Services Pager [phone] Office [phone]    Deno Etienne 05/16/2020, 9:53 AM

## 2020-05-16 NOTE — Progress Notes (Signed)
Referring Physician(s): Azucena Freed, PA-C  Supervising Physician: Jacqulynn Cadet  Patient Status:  [facility] - In-pt  Chief Complaint: History of gangrenous cholecystitis s/p laparoscopic cholecystectomy in OR 01/24/2020 by Dr. Bobbye Morton; complicated by development of gallbladder fossa abscess and is now s/p gallbladder drain placement in IR 02/13/2020 by Dr. Annamaria Boots.   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 He had an ERCP 04/25/20 with plastic stent exchanged for a metal stent.  HIDA Scan 05/14/20: IMPRESSION: Imaging features consistent with persistent bile leak accumulating in the lateral aspect of the right upper quadrant, likely in the paracolic gutter. After unclamping the biliary drain, radiotracer is visible within the drain tubing.  Subjective: Patient sitting on chair in the bathroom  - he had just showered. The drain and dressing were completely saturated with water. I removed the dressing and asked RN to replace. The site is clean and without erythema. Small amount of bile observed leaking around drain site. Gravity bag with approximately 40 cc of bilious fluid.   Allergies: Patient has no known allergies.  Medications: Prior to Admission medications   Medication Sig Start Date End Date Taking? Authorizing Provider  Calcium Carb-Cholecalciferol (CALCIUM+D3 PO) Take 1 tablet by mouth daily.   Yes [provider]  Coenzyme Q10 (COQ10) 400 MG CAPS Take 800 mg by mouth in the morning.    Yes [provider]  Cyanocobalamin (VITAMIN B-12) 5000 MCG TBDP Take 5,000 mcg by mouth daily.   Yes [provider]  GLUCOSAMINE-CHONDROITIN PO Take 1 tablet by mouth in the morning and at bedtime.    Yes [provider]  Javier Docker Oil 1000 MG CAPS Take 1,000 mg by mouth in the morning and at bedtime.    Yes  [provider]  lisinopril-hydrochlorothiazide (ZESTORETIC) 10-12.5 MG tablet Take 1 tablet by mouth daily. 03/04/20  Yes [provider]  Moringa Oleifera (MORINGA PO) Take 1,000 mg by mouth 2 (two) times daily.   Yes [provider]  Multiple Vitamin (MULTIVITAMIN WITH MINERALS) TABS tablet Take 1 tablet by mouth daily.   Yes [provider]  ondansetron (ZOFRAN) 4 MG tablet Take 1 tablet (4 mg total) by mouth every 8 (eight) hours as needed for nausea or vomiting. 04/25/20  Yes Mansouraty, Telford Nab., MD  OVER THE COUNTER MEDICATION Take 1 tablet by mouth See admin instructions. [facility] otc tablet- Take 1 tablet by mouth once a day   Yes [provider]  oxyCODONE (OXY IR/ROXICODONE) 5 MG immediate release tablet Take 5 mg by mouth 2 (two) times daily as needed (for pain).  04/25/20  Yes [provider]  pravastatin (PRAVACHOL) 80 MG tablet Take 40 mg by mouth in the morning and at bedtime.   Yes [provider]  Red Yeast Rice 600 MG CAPS Take 1,200 mg by mouth at bedtime.    Yes [provider]  sertraline (ZOLOFT) 100 MG tablet Take 100 mg by mouth daily. 11/08/19  Yes [provider]  TURMERIC PO Take 1,500 mg by mouth 2 (two) times daily.   Yes [provider]  zolpidem (AMBIEN) 5 MG tablet Take 5 mg by mouth at bedtime. 12/29/19  Yes [provider]  aspirin EC 81 MG tablet Take 81 mg by mouth 2 (two) times daily. Patient not taking: Reported on 04/26/2020    [provider]  Calcium Carb-Cholecalciferol (CALCIUM 1000 +  D PO) Take 1 tablet by mouth daily. Patient not taking: Reported on 04/26/2020    [provider]  ibuprofen (ADVIL) 200 MG tablet Take 400-600 mg by mouth every 6 (six) hours as needed for headache or moderate pain.    [provider]     Vital Signs: BP 129/85 (BP Location: Right Arm)    Pulse 92    Temp 98.2 F (36.8 C) (Oral)    Resp 16    Ht  5\' 9"  (1.753 m)    Wt 189 lb 9.5 oz (86 kg)    SpO2 97%    BMI 28.00 kg/m   Physical Exam Constitutional:      General: He is not in acute distress. Pulmonary:     Effort: Pulmonary effort is normal.  Abdominal:     Palpations: Abdomen is soft.     Tenderness: There is abdominal tenderness in the right upper quadrant.     Comments: Mild RUQ tenderness. Drain intact with approximately 40 cc of bilious fluid in gravity bag. The dressing was completely saturated post-shower. Dressing removed. Skin insertion site is without erythema but is oozing a small amount of bile. Request made to bedside RN to apply clean/dry dressing to the site.   Skin:    General: Skin is warm and dry.  Neurological:     Mental Status: He is alert and oriented to person, place, and time.     Imaging: CT ABDOMEN PELVIS W CONTRAST  Result Date: 05/13/2020 CLINICAL DATA:  History of cholecystectomy complicated by development of a postoperative bile leak from the cystic duct remnant, post image guided placement of a percutaneous drainage catheter into the gallbladder fossa fluid collection on 02/13/2020 (Dr. Annamaria Boots). Patient subsequently underwent placement of an internal biliary stent at 32/20/2542 which was complicated by development of postprocedural pancreatitis. Evaluate for bile leak/biloma. EXAM: CT ABDOMEN AND PELVIS WITH CONTRAST TECHNIQUE: Multidetector CT imaging of the abdomen and pelvis was performed using the standard protocol following bolus administration of intravenous contrast. CONTRAST:  124mL OMNIPAQUE IOHEXOL 300 MG/ML  SOLN COMPARISON:  CT abdomen pelvis-05/03/2020; 04/28/2020; 04/26/2020; 03/01/2020; 02/13/2020; image guided placement of gallbladder fossa drainage catheter-02/13/2020; drainage catheter injection-03/01/2020; ERCP-03/21/2020 FINDINGS: Lower chest: Limited visualization of lower thorax demonstrates small to moderate-sized left and trace right-sided pleural effusions with associated bibasilar  consolidative opacities and associated air bronchograms and volume loss within the bilateral lower lobes, left greater than right, similar to the 8/26 examination. Normal heart size.  No pericardial effusion. Hepatobiliary: Normal hepatic contour. No discrete hepatic lesions. Stable positioning of percutaneous drainage catheter with end coiled and locked within the gallbladder fossa. Stable positioning of internal biliary stent without development of intrahepatic biliary ductal dilatation. Interval development of small amount of perihepatic ascites which extends to the level of the lower abdomen/pelvis. Pancreas: Redemonstrated sequela of pancreatitis with extensive inflammatory change about the pancreatic bed. There is persistent enhancement of the pancreatic parenchyma without definitive evidence of pancreatic necrosis. No definitive pancreatic ductal dilatation on this non pancreatic protocol CT scan. Inflammatory change again extends along the anterior aspect of the left pararenal space without peripherally enhancing definable/drainable pancreatic pseudocyst. Spleen: Normal appearance of the spleen. Adrenals/Urinary Tract: There is symmetric enhancement and excretion of the bilateral kidneys. Redemonstrated punctate bilateral nonobstructing renal stones with dominant stones within the superior poles of the bilateral kidneys measuring 0.4 cm in diameter (left-coronal image 86, series 6; right-coronal image 88, series 6). No evidence of urinary obstruction or perinephric stranding. Normal appearance  the bilateral adrenal glands. Normal appearance of the urinary bladder given degree of distention. Stomach/Bowel: Moderate colonic stool burden without evidence of enteric obstruction. Scattered colonic diverticulosis without evidence of superimposed acute diverticulitis. Normal appearance of the terminal ileum and the retrocecal appendix. Mild bowel wall thickening involving the transverse colon is likely reactive  secondary to adjacent inflammatory change within the pancreas. No pneumoperitoneum, pneumatosis or portal venous gas. Vascular/Lymphatic: Scattered atherosclerotic plaque. The major branch vessels of the abdominal aorta appear patent within normal caliber abdominal aorta on this non CTA examination. The peripheral aspect of the portal vein as well as the portal splenule confluence and superior aspect of the SMV and splenic vein are narrowed though remains patent. Scattered porta hepatis, gastrohepatic ligament and mesenteric lymph nodes are prominent though individually not enlarged by size criteria, presumably reactive in etiology. Reproductive: Normal appearance of the prostate gland. Small moderate amount of fluid within the lower pelvis. Other: Diffuse body wall anasarca, most conspicuous about the midline of the low back. Musculoskeletal: No acute or aggressive osseous abnormalities. Stigmata of dish within the thoracic spine. Mild-to-moderate degenerative change the bilateral hips with joint space loss, subchondral sclerosis and osteophytosis. IMPRESSION: 1. Sequela of severe pancreatitis with extensive fluid about the pancreatic bed but without peripherally enhancing definable/drainable pancreatic pseudocyst and without definitive evidence of pancreatic necrosis. 2. Stable positioning of internal biliary stent with apparent preserved functionality as there is no intrahepatic biliary ductal dilatation. 3. Interval development of small to moderate volume intra-abdominal ascites, the etiology of which is not determined on this examination though given extensive pancreatitis and apparent functionality of the internal biliary stent, fluid may represent pancreatic enzymes. Fluid sampling with sending aspirated fluid for amylase, lipase and bilirubin levels could be performed as indicated. Alternatively, if there is concern for a bile leak, further evaluation with nuclear medicine HIDA scan could be performed as  indicated. 4. Stable positioning of gallbladder fossa percutaneous drainage catheter. 5. Grossly unchanged small to moderate sized left and trace right-sided pleural effusions with associated bibasilar opacities/atelectasis. 6. Bilateral nonobstructing nephrolithiasis. Electronically Signed   By: Sandi Mariscal M.D.   On: 05/13/2020 15:41   NM HEPATO BILIARY LEAK  Result Date: 05/15/2020 CLINICAL DATA:  Recurrent right upper quadrant abdominal pain. Status post cholecystectomy. Evaluate for bile leak. EXAM: NUCLEAR MEDICINE HEPATOBILIARY IMAGING TECHNIQUE: Sequential images of the abdomen were obtained out to 60 minutes following intravenous administration of radiopharmaceutical. RADIOPHARMACEUTICALS:  5.4 mCi Tc-79m  Choletec IV COMPARISON:  Abdomen/pelvis CT 05/13/2020. Nuclear scintigraphy 02/03/2020. FINDINGS: Prompt uptake and biliary excretion of activity by the liver is seen. Blood pool activity clears rapidly. Biliary activity evident by 25 minutes. No definite common bile duct activity by 60 minutes. No gut activity on today's study. Radiotracer accumulation at the inferior tip of the liver against a 25 minutes and progresses through the 60 minutes of the exam. This accumulates along the lateral right abdominal wall, inferior to the liver, likely in the paracolic gutter. Patient's biliary drain was unclamped and anterior imaging was obtained 5 minutes later, demonstrating radiotracer within the tubing of the drain. IMPRESSION: Imaging features consistent with persistent bile leak accumulating in the lateral aspect of the right upper quadrant, likely in the paracolic gutter. After unclamping the biliary drain, radiotracer is visible within the drain tubing. Electronically Signed   By: Misty Stanley M.D.   On: 05/15/2020 12:29    Labs:  CBC: Recent Labs    05/10/20 0426 05/11/20 0332 05/11/20 1712 05/15/20 8315  WBC 18.3* 15.1* 15.5* 16.6*  HGB 11.6* 11.3* 11.7* 12.1*  HCT 36.9* 35.4* 36.9*  37.7*  PLT 483* 449* 438* 441*    COAGS: Recent Labs    02/02/20 1902 02/02/20 2115  INR 1.2 1.1    BMP: Recent Labs    05/10/20 0426 05/11/20 0332 05/12/20 0334 05/15/20 0614  NA 132* 133* 134* 134*  K 4.6 4.6 4.5 4.3  CL 102 103 102 98  CO2 23 23 23 24   GLUCOSE 186* 145* 134* 128*  BUN 13 15 12 16   CALCIUM 8.1* 8.1* 8.4* 8.7*  CREATININE 0.73 0.66 0.64 0.99  GFRNONAA >60 >60 >60 >60  GFRAA >60 >60 >60 >60    LIVER FUNCTION TESTS: Recent Labs    05/06/20 0146 05/07/20 0432 05/12/20 0334 05/15/20 0614  BILITOT 0.6 0.4 0.9 1.0  AST 31 31 22 26   ALT 20 19 18 20   ALKPHOS 102 95 72 118  PROT 4.8* 4.7* 4.6* 5.6*  ALBUMIN 1.6* 1.5* 1.7* 2.0*    Assessment and Plan:  Gallbladder fossa abscess s/p gallbladder drain placement in IR 02/13/20; persistent fistulous connection between decompressed GB foss abscess and cystic duct (internal biliary stent in place).  Approximately 40 cc dark bile in gravity bag. 150 cc output documented in Epic. He is afebrile, WBC slightly up at 16.6 compared to yesterday. Continue current drain management per orders: record output every shift, change dressing daily or as needed. Site needs to stay clean and dry. Do not flush drainage catheter at this time.  Other plans per primary teams. GI is planning to repeat ERCP 05/17/20.  IR will continue to follow.   Electronically Signed: Soyla Dryer, AGACNP-BC [phone] 05/16/2020, 10:28 AM   I spent a total of 15 Minutes at the the patient's bedside AND on the patient's hospital floor or unit, greater than 50% of which was counseling/coordinating care for gallbladder fossa drain.

## 2020-05-17 ENCOUNTER — Encounter (HOSPITAL_COMMUNITY): Payer: Self-pay | Admitting: Internal Medicine

## 2020-05-17 ENCOUNTER — Inpatient Hospital Stay (HOSPITAL_COMMUNITY): Payer: 59

## 2020-05-17 ENCOUNTER — Inpatient Hospital Stay (HOSPITAL_COMMUNITY): Payer: 59 | Admitting: Certified Registered Nurse Anesthetist

## 2020-05-17 ENCOUNTER — Encounter (HOSPITAL_COMMUNITY)
Admission: EM | Disposition: A | Discharge status: Hospice | Payer: Self-pay | Source: Home / Self Care | Attending: Internal Medicine

## 2020-05-17 HISTORY — PX: ESOPHAGOGASTRODUODENOSCOPY (EGD) WITH PROPOFOL: SHX5813

## 2020-05-17 LAB — CBC WITH DIFFERENTIAL/PLATELET
Abs Immature Granulocytes: 0 10*3/uL (ref 0.00–0.07)
Basophils Absolute: 0 10*3/uL (ref 0.0–0.1)
Basophils Relative: 0 %
Eosinophils Absolute: 0 10*3/uL (ref 0.0–0.5)
Eosinophils Relative: 0 %
HCT: 38.9 % — ABNORMAL LOW (ref 39.0–52.0)
Hemoglobin: 12.2 g/dL — ABNORMAL LOW (ref 13.0–17.0)
Lymphocytes Relative: 0 %
Lymphs Abs: 0 10*3/uL — ABNORMAL LOW (ref 0.7–4.0)
MCH: 27.5 pg (ref 26.0–34.0)
MCHC: 31.4 g/dL (ref 30.0–36.0)
MCV: 87.6 fL (ref 80.0–100.0)
Monocytes Absolute: 0.5 10*3/uL (ref 0.1–1.0)
Monocytes Relative: 2 %
Neutro Abs: 26.4 10*3/uL — ABNORMAL HIGH (ref 1.7–7.7)
Neutrophils Relative %: 98 %
Platelets: 335 10*3/uL (ref 150–400)
RBC: 4.44 MIL/uL (ref 4.22–5.81)
RDW: 14.1 % (ref 11.5–15.5)
WBC: 26.9 10*3/uL — ABNORMAL HIGH (ref 4.0–10.5)
nRBC: 0 % (ref 0.0–0.2)
nRBC: 0 /100 WBC

## 2020-05-17 LAB — COMPREHENSIVE METABOLIC PANEL
ALT: 60 U/L — ABNORMAL HIGH (ref 0–44)
AST: 88 U/L — ABNORMAL HIGH (ref 15–41)
Albumin: 2 g/dL — ABNORMAL LOW (ref 3.5–5.0)
Alkaline Phosphatase: 1011 U/L — ABNORMAL HIGH (ref 38–126)
Anion gap: 11 (ref 5–15)
BUN: 16 mg/dL (ref 8–23)
CO2: 25 mmol/L (ref 22–32)
Calcium: 8.8 mg/dL — ABNORMAL LOW (ref 8.9–10.3)
Chloride: 99 mmol/L (ref 98–111)
Creatinine, Ser: 1.01 mg/dL (ref 0.61–1.24)
GFR calc Af Amer: >60 mL/min (ref >60)
GFR calc non Af Amer: >60 mL/min (ref >60)
Glucose, Bld: 163 mg/dL — ABNORMAL HIGH (ref 70–99)
Potassium: 4 mmol/L (ref 3.5–5.1)
Sodium: 135 mmol/L (ref 135–145)
Total Bilirubin: 2.2 mg/dL — ABNORMAL HIGH (ref 0.3–1.2)
Total Protein: 5.6 g/dL — ABNORMAL LOW (ref 6.5–8.1)

## 2020-05-17 SURGERY — ESOPHAGOGASTRODUODENOSCOPY (EGD) WITH PROPOFOL
Anesthesia: General

## 2020-05-17 MED ORDER — LACTATED RINGERS IV SOLN: INTRAVENOUS | Status: DC

## 2020-05-17 MED ORDER — GLUCAGON HCL RDNA (DIAGNOSTIC) 1 MG IJ SOLR
INTRAMUSCULAR | Status: AC
  Filled 2020-05-17: qty 1

## 2020-05-17 MED ORDER — INDOMETHACIN 50 MG RE SUPP
RECTAL | Status: DC | PRN
  Administered 2020-05-17: 100 mg via RECTAL

## 2020-05-17 MED ORDER — DEXAMETHASONE SODIUM PHOSPHATE 10 MG/ML IJ SOLN
INTRAMUSCULAR | Status: DC | PRN
  Administered 2020-05-17: 10 mg via INTRAVENOUS

## 2020-05-17 MED ORDER — ONDANSETRON HCL 4 MG/2ML IJ SOLN
INTRAMUSCULAR | Status: DC | PRN
  Administered 2020-05-17: 4 mg via INTRAVENOUS

## 2020-05-17 MED ORDER — PANTOPRAZOLE SODIUM 40 MG IV SOLR
40.0000 mg | Freq: Two times a day (BID) | INTRAVENOUS | Status: DC
  Administered 2020-05-17 – 2020-05-23 (×12): 40 mg via INTRAVENOUS
  Filled 2020-05-17 (×13): qty 40

## 2020-05-17 MED ORDER — LIDOCAINE 2% (20 MG/ML) 5 ML SYRINGE
INTRAMUSCULAR | Status: DC | PRN
  Administered 2020-05-17: 60 mg via INTRAVENOUS

## 2020-05-17 MED ORDER — PROPOFOL 10 MG/ML IV BOLUS
INTRAVENOUS | Status: DC | PRN
  Administered 2020-05-17: 140 mg via INTRAVENOUS

## 2020-05-17 MED ORDER — ROCURONIUM BROMIDE 10 MG/ML (PF) SYRINGE
PREFILLED_SYRINGE | INTRAVENOUS | Status: DC | PRN
  Administered 2020-05-17: 70 mg via INTRAVENOUS

## 2020-05-17 MED ORDER — MIDAZOLAM HCL 2 MG/2ML IJ SOLN
INTRAMUSCULAR | Status: DC | PRN
  Administered 2020-05-17: 2 mg via INTRAVENOUS

## 2020-05-17 MED ORDER — FENTANYL CITRATE (PF) 250 MCG/5ML IJ SOLN
INTRAMUSCULAR | Status: DC | PRN
Start: 2020-05-17 — End: 2020-05-18
  Administered 2020-05-17 (×2): 50 ug via INTRAVENOUS

## 2020-05-17 MED ORDER — INDOMETHACIN 50 MG RE SUPP
RECTAL | Status: AC
  Filled 2020-05-17: qty 2

## 2020-05-17 MED ORDER — PHENYLEPHRINE 40 MCG/ML (10ML) SYRINGE FOR IV PUSH (FOR BLOOD PRESSURE SUPPORT)
PREFILLED_SYRINGE | INTRAVENOUS | Status: DC | PRN
  Administered 2020-05-17: 120 ug via INTRAVENOUS
  Administered 2020-05-17: 80 ug via INTRAVENOUS

## 2020-05-17 MED ORDER — SUGAMMADEX SODIUM 200 MG/2ML IV SOLN
INTRAVENOUS | Status: DC | PRN
  Administered 2020-05-17: 200 mg via INTRAVENOUS

## 2020-05-17 MED ORDER — IOHEXOL 300 MG/ML  SOLN
100.0000 mL | Freq: Once | INTRAMUSCULAR | Status: AC | PRN
  Administered 2020-05-17: 100 mL via INTRAVENOUS

## 2020-05-17 NOTE — Interval H&P Note (Signed)
History and Physical Interval Note:  05/17/2020 12:54 PM  Adam Fowler  has presented today for surgery, with the diagnosis of bile leak.  The various methods of treatment have been discussed with the patient and family. After consideration of risks, benefits and other options for treatment, the patient has consented to  Procedure(s): ENDOSCOPIC RETROGRADE CHOLANGIOPANCREATOGRAPHY (ERCP) WITH PROPOFOL (N/A) as a surgical intervention.  The patient's history has been reviewed, patient examined, no change in status, stable for surgery.  I have reviewed the patient's chart and labs.  Questions were answered to the patient's satisfaction.    The risks of an ERCP were discussed at length, including but not limited to the risk of perforation, bleeding, abdominal pain, post-ERCP pancreatitis (while usually mild can be severe and even life threatening).  Higher than normal risk procedure due to the patient's status and already having significant pancreatitis.  Hopefully stent is in place and no overt evidence of dysfunction then I'll likely place plastic stents through this.  Otherwise, replacement may be necessary of FCSEMS.   [employer]

## 2020-05-17 NOTE — Progress Notes (Signed)
PROGRESS NOTE    Adam Fowler  RKY:[id number] DOB: May 06, 1956 DOA: 04/26/2020 PCP: Melony Overly, MD    Brief Narrative:  Patient admitted to the hospital working diagnosis of post ERCP acute pancreatitis.Prolonged hospitalization with multiple complications, including respiratory failure (required mechanical ventilation), AKI, encephalopathy and atrial fibrillation.  64 year old male with past medical history significant for hypertension. Heunderwent ERCP on 8/18 and biliary stent exchange for persistent postoperative bile leak,related to recent cholecystectomy with abscess status post percutaneous drainage. Patient developed severe epigastric abdominal pain, associated with nausea, and vomiting after ERCP procedure. On his initial physical examination blood pressure 137/88, heart rate 114, respiratoryrate23, oxygen saturation 92%,his lungs were clear to auscultation bilaterally, heart S1-S2, present, tachycardic, his abdomen was tender to palpation,itwas distended, no rebound or guarding,no lower extremity edema.  CT of the abdomen pelvis show changes consistent with acute pancreatitis, no abscess or drainable fluid collection. Patient was admitted to the medical ward, he received supportive medical therapy including intravenous fluids, analgesics and IV antibiotics.   His hospitalization has been complicated by metabolic encephalopathy,acute hypoxicrespiratory failure, renal failure, new onset paroxysmal atrial fibrillation and acute ileus.  08/21-08/22 rapid response due to increase work of breathing and atrial fibrillation, required invasive mechanical ventilation. He was treated for volume overload and aspiration pneumonia. Received diuresis and antibiotic therapy with Zosyn.   Liberated from mechanical ventilation on 08/24 and transitioned to nasal cannula with good toleration.   Patient continue to have leukocytosis and abdominal pain, repeat CT of the abdomen  pelvis(8/25)markedly diffuse pancreatic inflammation with fat stranding and free fluid.  His antibiotic therapy was switched from Zosyn to meropenem on 8/27. Hewasplaced on tube feeds via NG tubefor several days.  On 09/03 Ng tube was removed, patient tolerating nutritional support with by mouth intake.  Slowly patient improving, but continue to have high output biliary drain, despite stent in appropriate location and stable liver enzymes.  09/05. CT abdomen with sings of sequela of severe pancreatitis. Stable positioning of internal biliary stent with no intrahepatic biliary duct dilatation.  HIDA scan from 09/07 with persistent bile leak accumulating in the lateral aspect of the right upper quadrant, paracolic gutter.   Patient with very poor oral intake.    Assessment & Plan:   Principal Problem:   Post-ERCP acute pancreatitis Active Problems:   Severe sepsis (HCC)   Polycythemia   AKI (acute kidney injury) ([place])   Metabolic acidosis   Acute respiratory failure with hypoxia (HCC)   Bilateral pleural effusion   Ileus (HCC)   Postoperative bile leak    1. Acute post ERCP pancreatitis/ ileus.Persistent biliary leak per HIDA scan. Continue to have vomiting and not able to eat. Abdominal films with no ileus. Wbc stable 16,0 with persistent leukocytosis. AST 37, ALT 24.   Plan for ERCP today and possible stent exchange. Continue with supportive medical therapy with antiemetics and analgesics (aceteminophen, oxycodone SR/ IR and IV hydromorphone). If continue to vomit post ERCP, may have to replace NG tube.    Antibiotic therapy withmeropenem#14/14.    2. Acute hypoxic respiratory failure/bilateral pleural effusions more left than right, likely transudate.  Doing incentive spirometer as tolerated.   3. AKIwith hypokalemia.  Renal function today with serum cr at 0,92 with K at 3,9 and serum bicarbonate at 24.  Continue with slow rate of IV dextrose with  balanced electrolyte solutions.   Patient with poor po intake.   4. Paroxysmal atrial fibrillation/ transitory.Converted to simus rhythm. Has a low risk of thrombosis  CHADS vasc score of 1. Will hold on antithrombotic therapy for now. Now off telemetry for now.   6. Anemiaof chronic disease. today Hgb is 12 and Hct at 38.3   7. Acute metabolic encephalopathy/ depression and anxiety.patinet has been more calm with improve anxiety, will continue with sertraline and as needed trazodone at night for sleep.   8. HTN/ dyslipidemia.( Athome on lisinopril and hctz combination).On pravastatin. Continue to hold on antihypertensive medications.   11. Transitory hyperglycemia. Hgb A1c 5.6.fasting glucose this am is 121. Continue to hold on insulin therapy for now.   12. Moderate calorie protein malnutrition.continue with nutritional supplementation.Continue to have very poor oral intake, if continue with nausea and vomiting post ERCP, will plan to resume tube feedings.     Patient continue to be at high risk for worsening biliary leak   Status is: Inpatient  Remains inpatient appropriate because:IV treatments appropriate due to intensity of illness or inability to take PO   Dispo:  Patient From: Home  Planned Disposition: Home with Health Care Svc  Expected discharge date: 05/21/20  Medically stable for discharge: No    DVT prophylaxis: Enoxaparin   Code Status:    full Family Communication:  No family at the bedside      Consultants:   GI   Surgery    Antimicrobials:   Meropenem    Subjective: Patient continue to have nausea and vomiting, abdominal pain has improved, no chest pain. Continue to be very weak and deconditioned, not tolerating po diet.   Objective: Vitals:   05/16/20 1403 05/16/20 2108 05/17/20 0549 05/17/20 0753  BP: 136/84 (!) 141/79 (!) 145/97 139/87  Pulse: 89 96 (!) 103 95  Resp: 18 18 18 17   Temp: 98.1 F (36.7 C) 98.4 F (36.9  C) 97.9 F (36.6 C) 98 F (36.7 C)  TempSrc: Oral Oral Oral Oral  SpO2: 98% 96% 95% 97%  Weight:      Height:        Intake/Output Summary (Last 24 hours) at 05/17/2020 1102 Last data filed at 05/17/2020 0900 Gross per 24 hour  Intake 6940.35 ml  Output 550 ml  Net 6390.35 ml   Filed Weights   05/07/20 0500 05/08/20 0357 05/13/20 0306  Weight: 86.6 kg 86.9 kg 86 kg    Examination:   General: deconditioned and ill looking appearing Neurology: Awake and alert, non focal  E ENT: mild pallor, no icterus, oral mucosa moist Cardiovascular: No JVD. S1-S2 present, rhythmic, no gallops, rubs, or murmurs. ++ pitting bilateral lower extremity edema. Pulmonary: vesicular breath sounds bilaterally, adequate air movement, no wheezing, rhonchi or rales. Gastrointestinal. Abdomen soft, distended. Biliary drain in place Skin. No rashes Musculoskeletal: no joint deformities     Data Reviewed: I have personally reviewed following labs and imaging studies  CBC: Recent Labs  Lab 05/11/20 0332 05/11/20 1712 05/15/20 0614 05/16/20 1905  WBC 15.1* 15.5* 16.6* 16.0*  NEUTROABS 12.2* 12.3*  --  13.9*  HGB 11.3* 11.7* 12.1* 12.0*  HCT 35.4* 36.9* 37.7* 38.3*  MCV 88.1 88.7 88.5 88.0  PLT 449* 438* 441* 638   Basic Metabolic Panel: Recent Labs  Lab 05/11/20 0332 05/12/20 0334 05/15/20 0614 05/16/20 1905  NA 133* 134* 134* 135  K 4.6 4.5 4.3 3.9  CL 103 102 98 99  CO2 23 23 24 24   GLUCOSE 145* 134* 128* 121*  BUN 15 12 16 18   CREATININE 0.66 0.64 0.99 0.92  CALCIUM 8.1* 8.4* 8.7* 8.5*   GFR:  Estimated Creatinine Clearance: 88.1 mL/min (by C-G formula based on SCr of 0.92 mg/dL). Liver Function Tests: Recent Labs  Lab 05/12/20 0334 05/15/20 0614 05/16/20 1905  AST 22 26 37  ALT 18 20 24   ALKPHOS 72 118 424*  BILITOT 0.9 1.0 1.5*  PROT 4.6* 5.6* 5.6*  ALBUMIN 1.7* 2.0* 2.0*   Recent Labs  Lab 05/16/20 1905  LIPASE 27   No results for input(s): AMMONIA in the last  168 hours. Coagulation Profile: Recent Labs  Lab 05/16/20 1905  INR 1.3*   Cardiac Enzymes: No results for input(s): CKTOTAL, CKMB, CKMBINDEX, TROPONINI in the last 168 hours. BNP (last 3 results) No results for input(s): PROBNP in the last 8760 hours. HbA1C: No results for input(s): HGBA1C in the last 72 hours. CBG: Recent Labs  Lab 05/14/20 0634 05/14/20 1204 05/14/20 1636 05/14/20 1934 05/15/20 0647  GLUCAP 122* 119* 117* 124* 120*   Lipid Profile: No results for input(s): CHOL, HDL, LDLCALC, TRIG, CHOLHDL, LDLDIRECT in the last 72 hours. Thyroid Function Tests: No results for input(s): TSH, T4TOTAL, FREET4, T3FREE, THYROIDAB in the last 72 hours. Anemia Panel: No results for input(s): VITAMINB12, FOLATE, FERRITIN, TIBC, IRON, RETICCTPCT in the last 72 hours.    Radiology Studies: I have reviewed all of the imaging during this hospital visit personally     Scheduled Meds: . acetaminophen  650 mg Oral Q6H  . acidophilus  2 capsule Oral Daily  . calcium carbonate  1 tablet Oral TID WC  . Chlorhexidine Gluconate Cloth  6 each Topical Daily  . [START ON 05/18/2020] enoxaparin (LOVENOX) injection  40 mg Subcutaneous Q24H  . feeding supplement (ENSURE ENLIVE)  237 mL Oral BID BM  . mouth rinse  15 mL Mouth Rinse BID  . metoCLOPramide (REGLAN) injection  5 mg Intravenous TID AC  . multivitamin with minerals  1 tablet Oral Daily  . oxyCODONE  10 mg Oral Q12H  . pantoprazole  40 mg Oral Daily  . polyethylene glycol  17 g Oral Daily  . pravastatin  20 mg Oral q1800  . sertraline  100 mg Oral Daily  . sodium chloride flush  10-40 mL Intracatheter Q12H  . traZODone  50 mg Oral QHS   Continuous Infusions: . sodium chloride Stopped (05/07/20 0923)  . dextrose 5% lactated ringers 50 mL/hr at 05/16/20 1500  . meropenem (MERREM) IV 1 g (05/17/20 0915)     LOS: 20 days        Angella Montas Gerome Apley, MD

## 2020-05-17 NOTE — Progress Notes (Signed)
Subjective: Patient continues to have dreams that he can't breath and that no one believes him and he is terrified.  He wakes up and gasps for breath and is able to breath.  When he is awake, he denies any SOB.  It is all in his dreams.  Still not eating secondary to N/V.  Had his first BM last night.  Overall feels poorly  ROS: See above, otherwise other systems negative  Objective: Vital signs in last 24 hours: Temp:  [97.9 F (36.6 C)-98.4 F (36.9 C)] 98 F (36.7 C) (09/09 0753) Pulse Rate:  [89-103] 95 (09/09 0753) Resp:  [17-18] 17 (09/09 0753) BP: (136-145)/(79-97) 139/87 (09/09 0753) SpO2:  [95 %-98 %] 97 % (09/09 0753) Last BM Date: 05/17/20  Intake/Output from previous day: 09/08 0701 - 09/09 0700 In: 7300.4 [P.O.:600; I.V.:39; PY/PP:5093.2; IV Piggyback:900] Out: 200 [Urine:200] Intake/Output this shift: No intake/output data recorded.  PE: Abd: distended, drain in place with bilious output.  Drain output still not documented despite order to specifically state this is mandatory and very important to patient's care  Lab Results:  Recent Labs    05/15/20 0614 05/16/20 1905  WBC 16.6* 16.0*  HGB 12.1* 12.0*  HCT 37.7* 38.3*  PLT 441* 360   BMET Recent Labs    05/15/20 0614 05/16/20 1905  NA 134* 135  K 4.3 3.9  CL 98 99  CO2 24 24  GLUCOSE 128* 121*  BUN 16 18  CREATININE 0.99 0.92  CALCIUM 8.7* 8.5*   PT/INR Recent Labs    05/16/20 1905  LABPROT 16.1*  INR 1.3*   CMP     Component Value Date/Time   NA 135 05/16/2020 1905   K 3.9 05/16/2020 1905   CL 99 05/16/2020 1905   CO2 24 05/16/2020 1905   GLUCOSE 121 (H) 05/16/2020 1905   BUN 18 05/16/2020 1905   CREATININE 0.92 05/16/2020 1905   CALCIUM 8.5 (L) 05/16/2020 1905   PROT 5.6 (L) 05/16/2020 1905   ALBUMIN 2.0 (L) 05/16/2020 1905   AST 37 05/16/2020 1905   ALT 24 05/16/2020 1905   ALKPHOS 424 (H) 05/16/2020 1905   BILITOT 1.5 (H) 05/16/2020 1905   GFRNONAA >60  05/16/2020 1905   GFRAA >60 05/16/2020 1905   Lipase     Component Value Date/Time   LIPASE 27 05/16/2020 1905       Studies/Results: DG Abd 1 View  Result Date: 05/16/2020 CLINICAL DATA:  Abdominal distension with nausea and vomiting EXAM: ABDOMEN - 1 VIEW COMPARISON:  May 11, 2020 abdominal radiograph and CT abdomen and pelvis May 13, 2020. FINDINGS: Biliary stent appears unchanged in position. Pigtail catheter in periphery of right upper abdomen. There is moderate stool in the colon. There is no bowel dilatation or air-fluid level to suggest bowel obstruction. No free air. Feeding tube no longer present. IMPRESSION: Pigtail catheter right upper abdomen peripherally. Stable appearance of biliary stent on supine image. Moderate stool in colon. No bowel obstruction or free air evident. Electronically Signed   By: Lowella Grip III M.D.   On: 05/16/2020 13:58   NM HEPATO BILIARY LEAK  Result Date: 05/15/2020 CLINICAL DATA:  Recurrent right upper quadrant abdominal pain. Status post cholecystectomy. Evaluate for bile leak. EXAM: NUCLEAR MEDICINE HEPATOBILIARY IMAGING TECHNIQUE: Sequential images of the abdomen were obtained out to 60 minutes following intravenous administration of radiopharmaceutical. RADIOPHARMACEUTICALS:  5.4 mCi Tc-68m  Choletec IV COMPARISON:  Abdomen/pelvis CT 05/13/2020. Nuclear scintigraphy 02/03/2020.  FINDINGS: Prompt uptake and biliary excretion of activity by the liver is seen. Blood pool activity clears rapidly. Biliary activity evident by 25 minutes. No definite common bile duct activity by 60 minutes. No gut activity on today's study. Radiotracer accumulation at the inferior tip of the liver against a 25 minutes and progresses through the 60 minutes of the exam. This accumulates along the lateral right abdominal wall, inferior to the liver, likely in the paracolic gutter. Patient's biliary drain was unclamped and anterior imaging was obtained 5 minutes  later, demonstrating radiotracer within the tubing of the drain. IMPRESSION: Imaging features consistent with persistent bile leak accumulating in the lateral aspect of the right upper quadrant, likely in the paracolic gutter. After unclamping the biliary drain, radiotracer is visible within the drain tubing. Electronically Signed   By: Misty Stanley M.D.   On: 05/15/2020 12:29    Anti-infectives: Anti-infectives (From admission, onward)   Start     Dose/Rate Route Frequency Ordered Stop   05/04/20 1230  meropenem (MERREM) 1 g in sodium chloride 0.9 % 100 mL IVPB        1 g 200 mL/hr over 30 Minutes Intravenous Every 8 hours 05/04/20 1156     05/01/20 1400  piperacillin-tazobactam (ZOSYN) IVPB 3.375 g        3.375 g 12.5 mL/hr over 240 Minutes Intravenous Every 8 hours 05/01/20 1013 05/03/20 0907   04/29/20 2000  vancomycin (VANCOCIN) IVPB 1000 mg/200 mL premix  Status:  Discontinued        1,000 mg 200 mL/hr over 60 Minutes Intravenous Every 12 hours 04/29/20 0653 04/30/20 0831   04/29/20 0745  vancomycin (VANCOREADY) IVPB 1500 mg/300 mL        1,500 mg 150 mL/hr over 120 Minutes Intravenous  Once 04/29/20 0653 04/29/20 1129   04/26/20 2300  piperacillin-tazobactam (ZOSYN) IVPB 3.375 g        3.375 g 12.5 mL/hr over 240 Minutes Intravenous Every 8 hours 04/26/20 1716 05/01/20 0559   04/26/20 1700  piperacillin-tazobactam (ZOSYN) IVPB 3.375 g        3.375 g 100 mL/hr over 30 Minutes Intravenous  Once 04/26/20 1655 04/26/20 1844       Assessment/Plan Hx subtotal cholecystectomy - Dr. Bobbye Morton - 01/24/2020 Bile leak with ERCP and post ERCP pancreatitis - HIDA showed persistent bile leak as expected -appreciate GI input with plans for ERCP today -prealbumin is 10.  Will defer to GI/med regarding needs to reinsert cortrak for nutrition -new order placed and discussed with RN that drain output HAS to be documented as this is vital to his care at this point. -no acute surgical plans.  Will  monitor  FEN -NPO for procedure VTE -Lovenox ID -Merrem   LOS: 20 days    Adam Fowler , [facility] Surgery 05/17/2020, 8:45 AM Please see Amion for pager number during day hours 7:00am-4:30pm or 7:00am -11:30am on weekends

## 2020-05-17 NOTE — Anesthesia Preprocedure Evaluation (Addendum)
Anesthesia Evaluation  Patient identified by MRN, date of birth, ID band Patient awake    Reviewed: Allergy & Precautions, NPO status , Patient's Chart, lab work & pertinent test results  History of Anesthesia Complications Negative for: history of anesthetic complications  Airway Mallampati: II  TM Distance: >3 FB Neck ROM: Full    Dental  (+) Dental Advisory Given   Pulmonary neg pulmonary ROS,    Pulmonary exam normal        Cardiovascular hypertension, Pt. on medications Normal cardiovascular exam     Neuro/Psych negative neurological ROS  negative psych ROS   GI/Hepatic Neg liver ROS,  Bile leak    Endo/Other  negative endocrine ROS  Renal/GU negative Renal ROS     Musculoskeletal negative musculoskeletal ROS (+)   Abdominal   Peds  Hematology  (+) anemia ,  INR 1.3    Anesthesia Other Findings   Reproductive/Obstetrics                            Anesthesia Physical Anesthesia Plan  ASA: II  Anesthesia Plan: General   Post-op Pain Management:    Induction: Intravenous and Rapid sequence  PONV Risk Score and Plan: 2 and Treatment may vary due to age or medical condition, Ondansetron and Dexamethasone  Airway Management Planned: Oral ETT  Additional Equipment: None  Intra-op Plan:   Post-operative Plan: Extubation in OR  Informed Consent: I have reviewed the patients History and Physical, chart, labs and discussed the procedure including the risks, benefits and alternatives for the proposed anesthesia with the patient or authorized representative who has indicated his/her understanding and acceptance.     Dental advisory given  Plan Discussed with: CRNA and Anesthesiologist  Anesthesia Plan Comments:       Anesthesia Quick Evaluation

## 2020-05-17 NOTE — Anesthesia Procedure Notes (Signed)
Procedure Name: Intubation Date/Time: 05/17/2020 1:25 PM Performed by: Darletta Moll, CRNA Pre-anesthesia Checklist: Patient identified, Emergency Drugs available, Suction available and Patient being monitored Patient Re-evaluated:Patient Re-evaluated prior to induction Oxygen Delivery Method: Circle system utilized Preoxygenation: Pre-oxygenation with 100% oxygen Induction Type: IV induction and Rapid sequence Laryngoscope Size: Mac and 3 Grade View: Grade I Tube type: Oral Tube size: 7.5 mm Number of attempts: 1 Airway Equipment and Method: Stylet and Oral airway Placement Confirmation: ETT inserted through vocal cords under direct vision,  positive ETCO2 and breath sounds checked- equal and bilateral Secured at: 22 cm Tube secured with: Tape Dental Injury: Teeth and Oropharynx as per pre-operative assessment

## 2020-05-17 NOTE — Progress Notes (Signed)
Unable to draw blood from midline. Midline flushed well but no blood returned. Called and notified lab to draw.

## 2020-05-17 NOTE — Progress Notes (Signed)
Occupational Therapy Treatment Patient Details Name: Adam Fowler MRN: [id number] DOB: 1955-11-07 Today's Date: 05/17/2020    History of present illness Pt is a 64 yo male who presents to the ED with severe abdominal pain, nausea and vomiting. Found to have post ERCP acute pancreatits. Pt developed Hypoxic resp failure and was Intubated 8/22  and extubated 8/24. Pt also with new onset a fib and acute ileus. PMH includes HTN and ERCP.    OT comments  Pt continuing to progress toward stated OT goals. Session focused on BADL mobility progression. Pt reports feeling increased "achiness all over" and feeling tired/irritated due NPO status in preparation for procedure. Pt completed bed mobility and sit <> stands with min guard this date. Increased cues needed to manage safety and impulsivity with tasks. Increased time and cueing needed to problem solve basic tasks. Pt able to complete functional mobility to bathroom for toilet transfer and complete standing grooming. Will continue to assess cognition more thoroughly in further sessions. Current POC remains appropriate.    Follow Up Recommendations  Home health OT;Supervision/Assistance - 24 hour    Equipment Recommendations  3 in 1 bedside commode    Recommendations for Other Services      Precautions / Restrictions Precautions Precautions: Fall Precaution Comments: abdominal drain, watch HR Restrictions Weight Bearing Restrictions: No       Mobility Bed Mobility Overal bed mobility: Needs Assistance Bed Mobility: Supine to Sit     Supine to sit: Min guard        Transfers Overall transfer level: Needs assistance Equipment used: 1 person hand held assist Transfers: Sit to/from Stand Sit to Stand: Min guard         General transfer comment: one hand support for steadying    Balance Overall balance assessment: Needs assistance Sitting-balance support: Feet supported;No upper extremity supported Sitting balance-Leahy Scale:  Good     Standing balance support: Bilateral upper extremity supported;Single extremity supported;During functional activity   Standing balance comment: increased balance with one UE support                           ADL either performed or assessed with clinical judgement   ADL Overall ADL's : Needs assistance/impaired                         Toilet Transfer: Min guard;Ambulation;Regular Glass blower/designer Details (indicate cue type and reason): pt completed mobility to bathroom with 1 HH assist. Min guard for safe descent Toileting- Clothing Manipulation and Hygiene: Min guard;Sitting/lateral lean       Functional mobility during ADLs: Min guard       Vision Patient Visual Report: No change from baseline     Perception     Praxis      Cognition Arousal/Alertness: Awake/alert Behavior During Therapy: WFL for tasks assessed/performed Overall Cognitive Status: Impaired/Different from baseline Area of Impairment: Attention;Memory;Following commands;Safety/judgement;Problem solving                   Current Attention Level: Sustained Memory: Decreased short-term memory Following Commands: Follows one step commands with increased time Safety/Judgement: Decreased awareness of safety;Decreased awareness of deficits   Problem Solving: Difficulty sequencing;Slow processing;Requires verbal cues;Requires tactile cues General Comments: needs step by step cueing and instruction for basic tasks. Can be slightly impulsive with little awareness to safety        Exercises     Shoulder Instructions  General Comments      Pertinent Vitals/ Pain       Pain Assessment: Faces Faces Pain Scale: Hurts little more Pain Location: "all over" Pain Descriptors / Indicators: Aching Pain Intervention(s): Monitored during session  Home Living                                          Prior Functioning/Environment               Frequency  Min 2X/week        Progress Toward Goals  OT Goals(current goals can now be found in the care plan section)  Progress towards OT goals: Progressing toward goals  Acute Rehab OT Goals Patient Stated Goal: return to independence OT Goal Formulation: With patient Time For Goal Achievement: 05/31/20 Potential to Achieve Goals: Good  Plan Discharge plan needs to be updated    Co-evaluation                 AM-PAC OT "6 Clicks" Daily Activity     Outcome Measure   Help from another person eating meals?: None Help from another person taking care of personal grooming?: A Little Help from another person toileting, which includes using toliet, bedpan, or urinal?: A Little Help from another person bathing (including washing, rinsing, drying)?: A Little Help from another person to put on and taking off regular upper body clothing?: A Little Help from another person to put on and taking off regular lower body clothing?: A Little 6 Click Score: 19    End of Session    OT Visit Diagnosis: Unsteadiness on feet (R26.81);Other abnormalities of gait and mobility (R26.89);Muscle weakness (generalized) (M62.81);Other symptoms and signs involving cognitive function   Activity Tolerance Patient tolerated treatment well   Patient Left in chair;with call bell/phone within reach   Nurse Communication Mobility status        Time: 1655-3748 OT Time Calculation (min): 14 min  Charges: OT General Charges $OT Visit: 1 Visit OT Treatments $Self Care/Home Management : 8-22 mins  Zenovia Jarred, MSOT, OTR/L [address] [facility] Office Number: [phone] Pager: [phone]  Zenovia Jarred 05/17/2020, 4:55 PM

## 2020-05-17 NOTE — Plan of Care (Signed)

## 2020-05-17 NOTE — Op Note (Signed)
[facility] Patient Name: Adam Fowler Procedure Date : 05/17/2020 MRN: [id number] Attending MD: Justice Britain , MD Date of Birth: 1956/02/19 CSN: [id number] Age: 64 Admit Type: Inpatient Procedure:                ERCP Indications:              Bile leak, Abnormal hepatobiliary scintigraphy,                            Elevated liver enzymes Providers:                Justice Britain, MD, Benetta Spar RN, RN, Wynonia Sours, RN, Tyrone Apple, Technician, Wyatt Haste Muqtasid, CRNA Referring MD:             [facility] Medicines:                General Anesthesia, Meropenem not given since he                            received AM dose and is due around 500PM,                            Indomethacin 774 mg PR Complications:            No immediate complications. Estimated Blood Loss:     Estimated blood loss was minimal. Procedure:                Pre-Anesthesia Assessment:                           - Prior to the procedure, a History and Physical                            was performed, and patient medications and                            allergies were reviewed. The patient's tolerance of                            previous anesthesia was also reviewed. The risks                            and benefits of the procedure and the sedation                            options and risks were discussed with the patient.                            All questions were answered, and informed consent  was obtained. Prior Anticoagulants: The patient has                            taken Lovenox (enoxaparin), last dose was 2 days                            prior to procedure. ASA Grade Assessment: III - A                            patient with severe systemic disease. After                            reviewing the risks and benefits, the patient was                            deemed  in satisfactory condition to undergo the                            procedure.                           After obtaining informed consent, the scope was                            passed under direct vision. Throughout the                            procedure, the patient's blood pressure, pulse, and                            oxygen saturations were monitored continuously. The                            TJF-Q180V ([id number] Olympus Duodensocope was                            introduced through the mouth, and used to inject                            contrast into without successful cannulation. The                            GIF-H190 ([id number]) Olympus gastroscope was                            introduced through the mouth, and used to inject                            contrast into and used to locate the major papilla.                            The PCF-H190DL ([id number]) Olympus pediatric  colonscope was introduced through the mouth, and                            used to inject contrast into and used to locate the                            major papilla. The ERCP was extremely difficult due                            to bowel stenosis. Successful completion of the                            procedure was aided by performing the maneuvers                            documented (below) in this report. The patient                            tolerated the procedure. Scope In: Scope Out: Findings:      A metal biliary stent was visible on the scout film.      The esophagus was successfully intubated under direct vision without       detailed examination of the pharynx, larynx, and associated structures.       A standard esophagogastroduodenoscopy scope was used for the examination       of the upper gastrointestinal tract. The scope was passed under direct       vision through the upper GI tract. LA Grade C (one or more mucosal       breaks continuous between tops of  2 or more mucosal folds, less than 75%       circumference) esophagitis with no bleeding was found 28 to 40 cm from       the incisors. Retained fluid was found in the entire examined stomach -       suction via endoscope removed 1500 cc of fluid and revealed an extremely       dilated stomach. Patchy moderate inflammation with hemorrhage       characterized by congestion (edema), erosions, erythema, friability and       granularity was found in the entire examined stomach. Diffuse severe       inflammation characterized by congestion (edema), friability and       granularity was found in the duodenal bulb, in the D1/D2 sweep leading       to an acquired intrinsic severe stenosis. ERCP scope could not be passed       when it replaced the EGD scope. I then replaced the EGD scope and placed       a long 0.035 inch Soft Jagwire and it went into the deep duodenum. I       tried to pass the ERCP scope along the wire without success. I       transitioned to a pediatric colonoscope to see if more significant       pressure as well as positioning in the patient in a left lateral       position would be helpful with passage of the scope, but this was not       the case. I then replaced the  wire into the duodenum deeply. I       backloaded the ERCP scope with the wire in the duodenum in effort of       trying to get the scope to pass along the narrowed region of the D1/D2       sweep. Unfortunately this was also not succesful.      NGT was placed for decompression purposes. This was noted to be in the       stomach upon endoscopic evaluation. Minor nose bleeding occured with       placement of the 52F NGT.      The scopes were removed from the patient. Impression:               - Severe LA Grade C esophagitis with no bleeding -                            likely from stasis.                           - Retained gastric fluid - suctioned via endoscope.                           - Gastric distention  secondary to outlet                            obstruction - functional.                           - Gastritis with hemorrhage.                           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                           - Attempts at passage of EGD/ERCP/Pediatric                            Colonoscope not possible beyond D1/D2 sweep. See                            above for details on attempt. Recommendation:           - The patient will be observed post-procedure,                            until all discharge criteria are met.                           - Return patient to hospital ward for ongoing care.                           - Clear liquid diet.                           - If patient not tolerating the NGT or potentially  Cortrak being replaced, then he may need TPN for                            nutrition due to poor nutrition in next few days                            recurring. Plus he has direct evidence of gastric                            outlet obstruction with the duodenal                            stenosis/narrowing that will not allow any of our                            scopes to pass currently so is at risk of having                            further issues from a nutritional perspective..                           - Trend LFTs daily - need to be drawn this evening                            and daily.                           - Will consider repeat CT-Abdomen/Pelvis with IV                            Contrast tonight vs tomorrow AM.                           - If LFTs continue to trend upwards, then question                            will be need for percutaneous drain evaluation by                            IR to see if it has become dislodged and for                            attempt at biliary decompression, should stent have                            issues as a result of the significant edema that is                             present but will need CT scan to help guide things                            for [place].                           -  Continue Antibiotics as already prescribed and if                            they are to time out do not let them time out yet.                           - Repeat ERCP attempt could be considered in a few                            weeks, hopefully as congestion/narrowing improves,                            but if not able to wait that long or issues                            progress then transfer to a quaternary center will                            be likely required for repeat evaluation/attempt at                            biliary ERCP or potential need for surgical options                            to the persistent biliary leak.                           - The findings and recommendations were discussed                            with the patient.                           - The findings and recommendations were discussed                            with the patient's family.                           - The findings and recommendations were discussed                            with the referring physician. Procedure Code(s):        --- Professional ---                           [id number], Esophagogastroduodenoscopy, flexible,                            transoral; diagnostic, including collection of                            specimen(s) by brushing or washing, when performed                            (  separate procedure) Diagnosis Code(s):        --- Professional ---                           K20.90, Esophagitis, unspecified without bleeding                           K29.71, Gastritis, unspecified, with bleeding                           K29.80, Duodenitis without bleeding                           K31.5, Obstruction of duodenum                           R94.5, Abnormal results of liver function studies                           R74.8, Abnormal levels of other  serum enzymes                           K83.8, Other specified diseases of biliary tract CPT copyright 2019 [employer]. All rights reserved. The codes documented in this report are preliminary and upon coder review may  be revised to meet current compliance requirements. Justice Britain, MD 05/17/2020 3:39:29 PM Number of Addenda: 0

## 2020-05-17 NOTE — Progress Notes (Signed)
Labs from this afternoon reviewed. LFTs are trending upwards more significantly suggesting stent dysfunction and potentially from progressive edema/swelling that is occurring in the duodenum. We could not enter into the D2 region as a result of severe congestion. I had previously ordered the CTAP IV contrast study to be done tomorrow AM. This now needs to be done urgently overnight. He has progressive leukocytosis and could be developing issues of biliary obstruction. I suspect that there is also a component of the percutaneous drain that may not be allowing adequate drainage of the bile leak as well. Interventional Radiology service will likely be needed tomorrow for some sort of therapy, but we need to get imaging first. I have changed the order to urgent to get done this evening. I will page the covering [facility] service so they can also ensure that the CT gets completed this evening so it can be reviewed by Medicine/GI and likely Interventional radiology tomorrow.Justice Britain, MD [facility] Office # [phone]

## 2020-05-17 NOTE — Transfer of Care (Signed)
Immediate Anesthesia Transfer of Care Note  Patient: Jacorie Ernsberger  Procedure(s) Performed: ENDOSCOPIC RETROGRADE CHOLANGIOPANCREATOGRAPHY (ERCP) WITH PROPOFOL (N/A ) ESOPHAGOGASTRODUODENOSCOPY (EGD) WITH PROPOFOL (N/A )  Patient Location: Endoscopy Unit  Anesthesia Type:General  Level of Consciousness: drowsy and patient cooperative  Airway & Oxygen Therapy: Patient Spontanous Breathing  Post-op Assessment: Report given to RN, Post -op Vital signs reviewed and stable and Patient moving all extremities X 4  Post vital signs: Reviewed and stable  Last Vitals:  Vitals Value Taken Time  BP 139/74 05/17/20 1507  Temp    Pulse 99 05/17/20 1507  Resp 15 05/17/20 1507  SpO2 94 % 05/17/20 1507    Last Pain:  Vitals:   05/17/20 1233  TempSrc: (P) Temporal  PainSc: 6       Patients Stated Pain Goal: 3 (70/34/03 5248)  Complications: Pt vomited on induction, maintained VSS.

## 2020-05-17 NOTE — Plan of Care (Signed)
  Problem: Education: Goal: Knowledge of General Education information will improve Description: Including pain rating scale, medication(s)/side effects and non-pharmacologic comfort measures Outcome: Progressing   Problem: Health Behavior/Discharge Planning: Goal: Ability to manage health-related needs will improve Outcome: Progressing   Problem: Clinical Measurements: Goal: Ability to maintain clinical measurements within normal limits will improve Outcome: Progressing Goal: Will remain free from infection Outcome: Progressing Goal: Diagnostic test results will improve Outcome: Progressing Goal: Respiratory complications will improve Outcome: Progressing Goal: Cardiovascular complication will be avoided Outcome: Progressing   Problem: Nutrition: Goal: Adequate nutrition will be maintained Outcome: Progressing   Problem: Coping: Goal: Level of anxiety will decrease Outcome: Progressing   Problem: Elimination: Goal: Will not experience complications related to bowel motility Outcome: Progressing Goal: Will not experience complications related to urinary retention Outcome: Progressing   Problem: Pain Managment: Goal: General experience of comfort will improve Outcome: Progressing   Problem: Safety: Goal: Ability to remain free from injury will improve Outcome: Progressing   Problem: Skin Integrity: Goal: Risk for impaired skin integrity will decrease Outcome: Progressing   Problem: Education: Goal: Knowledge of disease or condition will improve Outcome: Progressing Goal: Understanding of medication regimen will improve Outcome: Progressing Goal: Individualized Educational Video(s) Outcome: Progressing   Problem: Activity: Goal: Ability to tolerate increased activity will improve Outcome: Progressing   Problem: Health Behavior/Discharge Planning: Goal: Ability to safely manage health-related needs after discharge will improve Outcome: Progressing    Problem: Activity: Goal: Ability to tolerate increased activity will improve Outcome: Progressing   Problem: Respiratory: Goal: Ability to maintain a clear airway and adequate ventilation will improve Outcome: Progressing   Problem: Role Relationship: Goal: Method of communication will improve Outcome: Progressing

## 2020-05-18 ENCOUNTER — Inpatient Hospital Stay (HOSPITAL_COMMUNITY): Payer: 59

## 2020-05-18 ENCOUNTER — Inpatient Hospital Stay: Payer: Self-pay

## 2020-05-18 DIAGNOSIS — K8689 Other specified diseases of pancreas: Secondary | ICD-10-CM

## 2020-05-18 HISTORY — PX: IR CHOLANGIOGRAM EXISTING TUBE: IMG6040

## 2020-05-18 HISTORY — PX: IR PARACENTESIS: IMG2679

## 2020-05-18 LAB — COMPREHENSIVE METABOLIC PANEL
ALT: 45 U/L — ABNORMAL HIGH (ref 0–44)
AST: 51 U/L — ABNORMAL HIGH (ref 15–41)
Albumin: 1.9 g/dL — ABNORMAL LOW (ref 3.5–5.0)
Alkaline Phosphatase: 753 U/L — ABNORMAL HIGH (ref 38–126)
Anion gap: 12 (ref 5–15)
BUN: 15 mg/dL (ref 8–23)
CO2: 25 mmol/L (ref 22–32)
Calcium: 8.3 mg/dL — ABNORMAL LOW (ref 8.9–10.3)
Chloride: 98 mmol/L (ref 98–111)
Creatinine, Ser: 0.9 mg/dL (ref 0.61–1.24)
GFR calc Af Amer: >60 mL/min (ref >60)
GFR calc non Af Amer: >60 mL/min (ref >60)
Glucose, Bld: 152 mg/dL — ABNORMAL HIGH (ref 70–99)
Potassium: 4.6 mmol/L (ref 3.5–5.1)
Sodium: 135 mmol/L (ref 135–145)
Total Bilirubin: 1.3 mg/dL — ABNORMAL HIGH (ref 0.3–1.2)
Total Protein: 5.2 g/dL — ABNORMAL LOW (ref 6.5–8.1)

## 2020-05-18 LAB — BODY FLUID CELL COUNT WITH DIFFERENTIAL
Eos, Fluid: 1 %
Lymphs, Fluid: 66 %
Monocyte-Macrophage-Serous Fluid: 7 % — ABNORMAL LOW (ref 50–90)
Neutrophil Count, Fluid: 26 % — ABNORMAL HIGH (ref 0–25)
Total Nucleated Cell Count, Fluid: 555 cu mm (ref 0–1000)

## 2020-05-18 LAB — CBC WITH DIFFERENTIAL/PLATELET
Abs Immature Granulocytes: 0.3 10*3/uL — ABNORMAL HIGH (ref 0.00–0.07)
Basophils Absolute: 0.1 10*3/uL (ref 0.0–0.1)
Basophils Relative: 0 %
Eosinophils Absolute: 0.1 10*3/uL (ref 0.0–0.5)
Eosinophils Relative: 0 %
HCT: 35.6 % — ABNORMAL LOW (ref 39.0–52.0)
Hemoglobin: 11.5 g/dL — ABNORMAL LOW (ref 13.0–17.0)
Immature Granulocytes: 1 %
Lymphocytes Relative: 3 %
Lymphs Abs: 0.9 10*3/uL (ref 0.7–4.0)
MCH: 28.3 pg (ref 26.0–34.0)
MCHC: 32.3 g/dL (ref 30.0–36.0)
MCV: 87.5 fL (ref 80.0–100.0)
Monocytes Absolute: 0.5 10*3/uL (ref 0.1–1.0)
Monocytes Relative: 2 %
Neutro Abs: 29.5 10*3/uL — ABNORMAL HIGH (ref 1.7–7.7)
Neutrophils Relative %: 94 %
Platelets: 332 10*3/uL (ref 150–400)
RBC: 4.07 MIL/uL — ABNORMAL LOW (ref 4.22–5.81)
RDW: 14.4 % (ref 11.5–15.5)
WBC: 31.3 10*3/uL — ABNORMAL HIGH (ref 4.0–10.5)
nRBC: 0 % (ref 0.0–0.2)

## 2020-05-18 MED ORDER — IOHEXOL 300 MG/ML  SOLN
50.0000 mL | Freq: Once | INTRAMUSCULAR | Status: AC | PRN
  Administered 2020-05-18: 10 mL

## 2020-05-18 MED ORDER — LIDOCAINE HCL 1 % IJ SOLN
INTRAMUSCULAR | Status: AC | PRN
  Administered 2020-05-18: 20 mL via INTRADERMAL

## 2020-05-18 MED ORDER — LIDOCAINE HCL 1 % IJ SOLN
INTRAMUSCULAR | Status: AC
  Filled 2020-05-18: qty 20

## 2020-05-18 NOTE — Anesthesia Postprocedure Evaluation (Signed)
Anesthesia Post Note  Patient: Adam Fowler  Procedure(s) Performed: ESOPHAGOGASTRODUODENOSCOPY (EGD) WITH PROPOFOL (N/A ) Aborted ERCP     Patient location during evaluation: PACU Anesthesia Type: General Level of consciousness: awake and alert Pain management: pain level controlled Vital Signs Assessment: post-procedure vital signs reviewed and stable Respiratory status: spontaneous breathing, nonlabored ventilation, respiratory function stable and patient connected to nasal cannula oxygen Cardiovascular status: blood pressure returned to baseline and stable Postop Assessment: no apparent nausea or vomiting Anesthetic complications: no Comments: Patient vomitted immediately following induction. Suctioned, turned to side, intubated quickly. Some gastric fluids suctioned out of ETT, suggesting at least some degree of aspiration. Doing well postoperatively with minimal O2 requirements.    No complications documented.  Last Vitals:  Vitals:   05/17/20 2034 05/18/20 0341  BP: (!) 115/92 122/75  Pulse: 100 99  Resp: 18 17  Temp: 36.6 C (!) 36.4 C  SpO2: 92% 90%    Last Pain:  Vitals:   05/18/20 0615  TempSrc:   PainSc: 6    Pain Goal: Patients Stated Pain Goal: 3 (05/17/20 0120)                 Audry Pili

## 2020-05-18 NOTE — Progress Notes (Signed)
[facility] Progress Note  1 Day Post-Op  Subjective: Patient reports he is feeling better right now since getting some IV pain medication. He had NGT placed overnight and has had a lot out from this but has also been taking in clears. He has not had much out from IR drain. He reports feeling like an old machine that's been shoved in the corner and tinkered with every week. Frustrated with progress.   Objective: Vital signs in last 24 hours: Temp:  [97.5 F (36.4 C)-98.6 F (37 C)] 97.5 F (36.4 C) (09/10 0341) Pulse Rate:  [88-100] 99 (09/10 0341) Resp:  [11-18] 17 (09/10 0341) BP: (115-161)/(74-97) 122/75 (09/10 0341) SpO2:  [90 %-96 %] 90 % (09/10 0341) Weight:  [84.6 kg] 84.6 kg (09/10 0500) Last BM Date: 05/17/20  Intake/Output from previous day: 09/09 0701 - 09/10 0700 In: 1813.8 [I.V.:1513.7; IV Piggyback:300.1] Out: 4051 [Urine:551; Emesis/NG FWYOVZ:8588] Intake/Output this shift: No intake/output data recorded.  PE: General: pleasant, WD, ill appearing male who is laying in bed in NAD Heart: regular, rate, and rhythm.  Palpable radial and pedal pulses bilaterally Lungs: CTAB, no wheezes, rhonchi, or rales noted.  Respiratory effort nonlabored Abd: soft, NT, distended, NGT with clear yellow drainage, IR drain with scant     Lab Results:  Recent Labs    05/17/20 1812 05/18/20 0513  WBC 26.9* 31.3*  HGB 12.2* 11.5*  HCT 38.9* 35.6*  PLT 335 332   BMET Recent Labs    05/17/20 1812 05/18/20 0513  NA 135 135  K 4.0 4.6  CL 99 98  CO2 25 25  GLUCOSE 163* 152*  BUN 16 15  CREATININE 1.01 0.90  CALCIUM 8.8* 8.3*   PT/INR Recent Labs    05/16/20 1905  LABPROT 16.1*  INR 1.3*   CMP     Component Value Date/Time   NA 135 05/18/2020 0513   K 4.6 05/18/2020 0513   CL 98 05/18/2020 0513   CO2 25 05/18/2020 0513   GLUCOSE 152 (H) 05/18/2020 0513   BUN 15 05/18/2020 0513   CREATININE 0.90 05/18/2020 0513   CALCIUM 8.3 (L) 05/18/2020  0513   PROT 5.2 (L) 05/18/2020 0513   ALBUMIN 1.9 (L) 05/18/2020 0513   AST 51 (H) 05/18/2020 0513   ALT 45 (H) 05/18/2020 0513   ALKPHOS 753 (H) 05/18/2020 0513   BILITOT 1.3 (H) 05/18/2020 0513   GFRNONAA >60 05/18/2020 0513   GFRAA >60 05/18/2020 0513   Lipase     Component Value Date/Time   LIPASE 27 05/16/2020 1905       Studies/Results: DG Abd 1 View  Result Date: 05/16/2020 CLINICAL DATA:  Abdominal distension with nausea and vomiting EXAM: ABDOMEN - 1 VIEW COMPARISON:  May 11, 2020 abdominal radiograph and CT abdomen and pelvis May 13, 2020. FINDINGS: Biliary stent appears unchanged in position. Pigtail catheter in periphery of right upper abdomen. There is moderate stool in the colon. There is no bowel dilatation or air-fluid level to suggest bowel obstruction. No free air. Feeding tube no longer present. IMPRESSION: Pigtail catheter right upper abdomen peripherally. Stable appearance of biliary stent on supine image. Moderate stool in colon. No bowel obstruction or free air evident. Electronically Signed   By: Lowella Grip III M.D.   On: 05/16/2020 13:58   CT ABDOMEN PELVIS W CONTRAST  Result Date: 05/17/2020 CLINICAL DATA:  Follow-up pancreatitis. EXAM: CT ABDOMEN AND PELVIS WITH CONTRAST TECHNIQUE: Multidetector CT imaging of the abdomen and pelvis was  performed using the standard protocol following bolus administration of intravenous contrast. CONTRAST:  150mL OMNIPAQUE IOHEXOL 300 MG/ML  SOLN COMPARISON:  CT 4 days ago 05/13/2020, additional priors reviewed. FINDINGS: Lower chest: Similar left pleural effusion from prior exam, compressive atelectasis. Previous right pleural effusion has improved. There are worsening patchy opacities within the lingula and both lower lobes, many of which are ground-glass. Hepatobiliary: Mild hepatic steatosis. Stable caudate cyst. Percutaneous drainage catheter in the gallbladder fossa unchanged in position. Trace adjacent fluid.  There is a biliary stent in place. Small amount of pneumobilia in the liver. No intrahepatic biliary ductal dilatation. Pancreas: Redemonstrated sequela of pancreatitis with extensive peripancreatic fat stranding and peripancreatic fluid. Pancreatic fluid appears organizing, with some areas of peripheral enhancement anteriorly. The degree of peripancreatic fluid similar to prior exam. There is no evidence of pancreatic air. Mild atrophy of the pancreatic head. No pancreatic ductal dilatation. Spleen: Prominent size spanning 13.9 cm cranial caudal. No focal abnormality. Adrenals/Urinary Tract: Normal adrenal glands. No hydronephrosis or perinephric edema. Homogeneous renal enhancement with symmetric excretion on delayed phase imaging. Nonobstructing stones in both kidneys. Minimal cortical scarring in the upper left kidney. Urinary bladder is partially distended without wall thickening. Stomach/Bowel: Enteric tube within the stomach which is distended with fluid and air. There is mild gastric wall thickening which may be in part reactive. Normal positioning of the duodenum and ligament of Treitz. No small bowel obstruction or obvious inflammation, allowing for presence of intra-abdominal ascites. Normal contrast filled appendix. Contrast throughout the colon with multifocal colonic diverticulosis. No discrete diverticulitis. Improved transverse colonic wall thickening from prior. Vascular/Lymphatic: Peripancreatic fluid causes mass effect on the peripheral portal vein, portal splenic confluence and upper aspect of the superior mesenteric vein, however no evidence of thrombus or occlusion. Vessels are narrowed but patent. The intrahepatic portal veins are patent. Abdominal aorta is normal in caliber with mild atherosclerosis. Prominent upper abdominal lymph nodes are likely reactive and stable from prior exam. No progressive adenopathy. Reproductive: Normal sized prostate gland spanning 3.9 cm transverse. Other:  Moderate volume of abdominopelvic ascites, not significantly changed from prior exam. Peripancreatic fluid appears slightly organizing with some areas of peripheral enhancement. No internal air. Generalized edema of the intra-abdominal and omental fat. Mild generalized subcutaneous body wall edema. There is no free intra-abdominal air. Musculoskeletal: There are no acute or suspicious osseous abnormalities. Degenerative change in the spine. IMPRESSION: 1. Redemonstrated sequela of pancreatitis with extensive peripancreatic fat stranding and peripancreatic fluid. Pancreatic fluid appears slightly organizing with some areas of peripheral enhancement anteriorly since the prior exam. The degree of peripancreatic fluid is similar to prior. 2. Percutaneous drainage catheter in the gallbladder fossa unchanged with minimal adjacent fluid. Unchanged position of biliary stent. No intrahepatic biliary ductal dilatation. 3. Moderate volume of abdominopelvic ascites, not significantly changed from prior exam. 4. Improved transverse colonic wall thickening from prior exam. Colonic diverticulosis without diverticulitis. 5. Bilateral nonobstructing renal stones. 6. Improving right pleural effusion, stable left pleural effusion. There are increasing patchy and ground-glass opacities within the lung bases that is suspicious for infection, included COVID-19. Aortic Atherosclerosis (ICD10-I70.0). Electronically Signed   By: Keith Rake M.D.   On: 05/17/2020 22:30   DG ERCP  Result Date: 05/17/2020 CLINICAL DATA:  ERCP EXAM: ERCP TECHNIQUE: Multiple spot images obtained with the fluoroscopic device and submitted for interpretation post-procedure. FLUOROSCOPY TIME:  Fluoroscopy Time:  2 minutes and 22 seconds Radiation Exposure Index (if provided by the fluoroscopic device): 45.15 mGy Number of Acquired  Spot Images: 6 COMPARISON:  CT dated 05/13/2020 FINDINGS: Initial images demonstrate a biliary stent in place in addition to a  drain within the right upper quadrant. No injection of contrast was visualized. The images provided do not appear to demonstrate cannulation of the common bile duct. IMPRESSION: ERCP. Please see separate operative report for complete intraoperative details. These images were submitted for radiologic interpretation only. Please see the procedural report for the amount of contrast and the fluoroscopy time utilized. Electronically Signed   By: Constance Holster M.D.   On: 05/17/2020 15:30    Anti-infectives: Anti-infectives (From admission, onward)   Start     Dose/Rate Route Frequency Ordered Stop   05/04/20 1230  meropenem (MERREM) 1 g in sodium chloride 0.9 % 100 mL IVPB        1 g 200 mL/hr over 30 Minutes Intravenous Every 8 hours 05/04/20 1156     05/01/20 1400  piperacillin-tazobactam (ZOSYN) IVPB 3.375 g        3.375 g 12.5 mL/hr over 240 Minutes Intravenous Every 8 hours 05/01/20 1013 05/03/20 0907   04/29/20 2000  vancomycin (VANCOCIN) IVPB 1000 mg/200 mL premix  Status:  Discontinued        1,000 mg 200 mL/hr over 60 Minutes Intravenous Every 12 hours 04/29/20 0653 04/30/20 0831   04/29/20 0745  vancomycin (VANCOREADY) IVPB 1500 mg/300 mL        1,500 mg 150 mL/hr over 120 Minutes Intravenous  Once 04/29/20 0653 04/29/20 1129   04/26/20 2300  piperacillin-tazobactam (ZOSYN) IVPB 3.375 g        3.375 g 12.5 mL/hr over 240 Minutes Intravenous Every 8 hours 04/26/20 1716 05/01/20 0559   04/26/20 1700  piperacillin-tazobactam (ZOSYN) IVPB 3.375 g        3.375 g 100 mL/hr over 30 Minutes Intravenous  Once 04/26/20 1655 04/26/20 1844       Assessment/Plan Hx subtotal cholecystectomy - Dr. Bobbye Morton - 01/24/2020 Bile leak with ERCP and post ERCP pancreatitis -HIDA showed persistent bile leak as expected - appreciate GI input, ERCP unsuccessful yesterday  - CT overnight shows pancreatitis with some organizing pancreatic collections, unchanged position of biliary stent, moderate  ascites, improved colonic wall thickening  - await GI recs but I think patient likely needs transhepatic drainage - prealbumin is 10. Will defer to GI/med, consider TPN  -no acute surgical plans.  Will monitor  FEN -NGT to LIWS, CLD VTE -Lovenox ID -Merrem  LOS: 21 days    Norm Parcel , [facility] 05/18/2020, 10:20 AM Please see Amion for pager number during day hours 7:00am-4:30pm

## 2020-05-18 NOTE — Progress Notes (Signed)
Nutrition Follow-up  DOCUMENTATION CODES:   Not applicable  INTERVENTION:   -D/c Ensure Enlive po TID, each supplement provides 350 kcal and 20 grams of protein -RD will follow for diet advancement and supplement diet as appropriate -If prolonged NPO/ clear liquid diet status is anticipated, consider initiation of nutrition support (TPN)  NUTRITION DIAGNOSIS:   Inadequate oral intake related to acute illness as evidenced by NPO status.  Ongoing  GOAL:   Patient will meet greater than or equal to 90% of their needs  Unmet  MONITOR:   PO intake, Supplement acceptance, Labs, Weight trends, TF tolerance, I & O's  REASON FOR ASSESSMENT:   Ventilator    ASSESSMENT:   64 year old male with PMH of HTN, ERCP on 8/18 s/p biliary stent exchange for persistent postoperative bile leak after recent cholecystectomy with abscess s/- percutaneous drainage. CT abdomen/pelvis showing acute pancreatitis. Pt admitted with severe sepsis secondary to post-ERCP pancreatitis.  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 RD did not disturb. He remains with NGT for decompression. He has been on a clear liquid diet  since 05/17/20.   Per GI notes, LFT increasing (concern for stent dysfunction and potentially from progressive edema/swelling that is occurring in the duodenum). Plan to CT of abdomen and pelvis with IV contrast for further investigation.   RD will continue to follow for diet advancement. If GOO does not resolve and prolonged clear liquid/ NPO status is anticipated, may need to consider TPN.   Labs reviewed.   Diet Order:   Diet Order            Diet clear liquid Room service appropriate? Yes; Fluid consistency: Thin  Diet effective now                 EDUCATION NEEDS:   Education needs have been addressed  Skin:  Skin Assessment: Skin Integrity Issues: Skin Integrity Issues:: Incisions Incisions: closed abdomen  Last BM:  05/17/20  Height:   Ht Readings from Last 1 Encounters:  05/02/20 5\' 9"  (1.753 m)    Weight:   Wt Readings from Last 1 Encounters:  05/18/20 84.6 kg    Ideal Body Weight:  72.7 kg  BMI:  Body mass index is 27.54 kg/m.  Estimated Nutritional Needs:   Kcal:  7408-1448  Protein:  130-145 grams  Fluid:  > 2.3 L    Loistine Chance, RD, LDN, [place] Registered Dietitian II Certified Diabetes Care and Education Specialist Please refer to [facility] for RD and/or RD on-call/weekend/after hours pager

## 2020-05-18 NOTE — Progress Notes (Addendum)
Gastroenterology Inpatient Follow-up Note   PATIENT IDENTIFICATION  Adam Fowler is a 64 y.o. male with a pmh significant for nephrolithiasis, hypertension, insomnia, complicated symptomatic gallbladder disease (status post ERCP for choledocholithiasis and subsequent cholecystectomy (with persistent bile leak), post ERCP pancreatitis. Hospital Day: 23  SUBJECTIVE  Patient denies fevers chills. No output from percutaneous drain in >16 hours. Still having abdominal pain. Not able to tolerate much more than liquids. Leukocytosis progressing. Reviewed imaging (CT as well as ERCP) with patient and wife in depth today.   OBJECTIVE  Scheduled Inpatient Medications:  . acetaminophen  650 mg Oral Q6H  . acidophilus  2 capsule Oral Daily  . calcium carbonate  1 tablet Oral TID WC  . Chlorhexidine Gluconate Cloth  6 each Topical Daily  . enoxaparin (LOVENOX) injection  40 mg Subcutaneous Q24H  . mouth rinse  15 mL Mouth Rinse BID  . metoCLOPramide (REGLAN) injection  5 mg Intravenous TID AC  . multivitamin with minerals  1 tablet Oral Daily  . oxyCODONE  10 mg Oral Q12H  . pantoprazole (PROTONIX) IV  40 mg Intravenous Q12H  . polyethylene glycol  17 g Oral Daily  . pravastatin  20 mg Oral q1800  . sertraline  100 mg Oral Daily  . sodium chloride flush  10-40 mL Intracatheter Q12H  . traZODone  50 mg Oral QHS   Continuous Inpatient Infusions:  . sodium chloride Stopped (05/07/20 0923)  . dextrose 5% lactated ringers 50 mL/hr at 05/17/20 1719  . meropenem (MERREM) IV 1 g (05/18/20 0918)   PRN Inpatient Medications: HYDROmorphone (DILAUDID) injection, ibuprofen, ipratropium-albuterol, lidocaine, metoprolol tartrate, oxyCODONE, polyvinyl alcohol, promethazine, sodium chloride flush   Physical Examination  Temp:  [97.4 F (36.3 C)-98.2 F (36.8 C)] 97.4 F (36.3 C) (09/10 1212) Pulse Rate:  [90-100] 92 (09/10 1212) Resp:  [11-18] 17 (09/10 1212) BP: (114-161)/(74-97) 114/86  (09/10 1212) SpO2:  [90 %-94 %] 90 % (09/10 1212) Weight:  [84.6 kg] 84.6 kg (09/10 0500) Temp (24hrs), Avg:97.8 F (36.6 C), Min:97.4 F (36.3 C), Max:98.2 F (36.8 C)  Weight: 84.6 kg GEN: NAD, sitting up in bed, appears chronically ill PSYCH: Cooperative, without pressured speech EYE: Conjunctivae pale-pink ENT: Dry MM, NGT in place CV: Nontachycardic RESP: Decreased BS bilaterally with some rales appreciated in left lung base GI: Hypoactive BS, TTP throughout abdomen but no worse than yesterday, volitional guarding present, no rebound MSK/EXT: BLE is present SKIN: No jaundice NEURO:  Alert & Oriented x 3, no focal deficits   Review of Data   Laboratory Studies   Recent Labs  Lab 05/18/20 0513  NA 135  K 4.6  CL 98  CO2 25  BUN 15  CREATININE 0.90  GLUCOSE 152*  CALCIUM 8.3*   Recent Labs  Lab 05/18/20 0513  AST 51*  ALT 45*  ALKPHOS 753*    Recent Labs  Lab 05/16/20 1905 05/16/20 1905 05/17/20 1812 05/17/20 1812 05/18/20 0513  WBC 16.0*   < > 26.9*   < > 31.3*  HGB 12.0*   < > 12.2*   < > 11.5*  HCT 38.3*   < > 38.9*   < > 35.6*  PLT 360  --  335  --  332   < > = values in this interval not displayed.   Recent Labs  Lab 05/16/20 1905  INR 1.3*     Imaging Studies  CT ABDOMEN PELVIS W CONTRAST  Result Date: 05/17/2020 CLINICAL DATA:  Follow-up pancreatitis. EXAM:  CT ABDOMEN AND PELVIS WITH CONTRAST TECHNIQUE: Multidetector CT imaging of the abdomen and pelvis was performed using the standard protocol following bolus administration of intravenous contrast. CONTRAST:  129mL OMNIPAQUE IOHEXOL 300 MG/ML  SOLN COMPARISON:  CT 4 days ago 05/13/2020, additional priors reviewed. FINDINGS: Lower chest: Similar left pleural effusion from prior exam, compressive atelectasis. Previous right pleural effusion has improved. There are worsening patchy opacities within the lingula and both lower lobes, many of which are ground-glass. Hepatobiliary: Mild hepatic  steatosis. Stable caudate cyst. Percutaneous drainage catheter in the gallbladder fossa unchanged in position. Trace adjacent fluid. There is a biliary stent in place. Small amount of pneumobilia in the liver. No intrahepatic biliary ductal dilatation. Pancreas: Redemonstrated sequela of pancreatitis with extensive peripancreatic fat stranding and peripancreatic fluid. Pancreatic fluid appears organizing, with some areas of peripheral enhancement anteriorly. The degree of peripancreatic fluid similar to prior exam. There is no evidence of pancreatic air. Mild atrophy of the pancreatic head. No pancreatic ductal dilatation. Spleen: Prominent size spanning 13.9 cm cranial caudal. No focal abnormality. Adrenals/Urinary Tract: Normal adrenal glands. No hydronephrosis or perinephric edema. Homogeneous renal enhancement with symmetric excretion on delayed phase imaging. Nonobstructing stones in both kidneys. Minimal cortical scarring in the upper left kidney. Urinary bladder is partially distended without wall thickening. Stomach/Bowel: Enteric tube within the stomach which is distended with fluid and air. There is mild gastric wall thickening which may be in part reactive. Normal positioning of the duodenum and ligament of Treitz. No small bowel obstruction or obvious inflammation, allowing for presence of intra-abdominal ascites. Normal contrast filled appendix. Contrast throughout the colon with multifocal colonic diverticulosis. No discrete diverticulitis. Improved transverse colonic wall thickening from prior. Vascular/Lymphatic: Peripancreatic fluid causes mass effect on the peripheral portal vein, portal splenic confluence and upper aspect of the superior mesenteric vein, however no evidence of thrombus or occlusion. Vessels are narrowed but patent. The intrahepatic portal veins are patent. Abdominal aorta is normal in caliber with mild atherosclerosis. Prominent upper abdominal lymph nodes are likely reactive and  stable from prior exam. No progressive adenopathy. Reproductive: Normal sized prostate gland spanning 3.9 cm transverse. Other: Moderate volume of abdominopelvic ascites, not significantly changed from prior exam. Peripancreatic fluid appears slightly organizing with some areas of peripheral enhancement. No internal air. Generalized edema of the intra-abdominal and omental fat. Mild generalized subcutaneous body wall edema. There is no free intra-abdominal air. Musculoskeletal: There are no acute or suspicious osseous abnormalities. Degenerative change in the spine. IMPRESSION: 1. Redemonstrated sequela of pancreatitis with extensive peripancreatic fat stranding and peripancreatic fluid. Pancreatic fluid appears slightly organizing with some areas of peripheral enhancement anteriorly since the prior exam. The degree of peripancreatic fluid is similar to prior. 2. Percutaneous drainage catheter in the gallbladder fossa unchanged with minimal adjacent fluid. Unchanged position of biliary stent. No intrahepatic biliary ductal dilatation. 3. Moderate volume of abdominopelvic ascites, not significantly changed from prior exam. 4. Improved transverse colonic wall thickening from prior exam. Colonic diverticulosis without diverticulitis. 5. Bilateral nonobstructing renal stones. 6. Improving right pleural effusion, stable left pleural effusion. There are increasing patchy and ground-glass opacities within the lung bases that is suspicious for infection, included COVID-19. Aortic Atherosclerosis (ICD10-I70.0). Electronically Signed   By: Keith Rake M.D.   On: 05/17/2020 22:30   DG ERCP  Result Date: 05/17/2020 CLINICAL DATA:  ERCP EXAM: ERCP TECHNIQUE: Multiple spot images obtained with the fluoroscopic device and submitted for interpretation post-procedure. FLUOROSCOPY TIME:  Fluoroscopy Time:  2 minutes and  22 seconds Radiation Exposure Index (if provided by the fluoroscopic device): 45.15 mGy Number of Acquired  Spot Images: 6 COMPARISON:  CT dated 05/13/2020 FINDINGS: Initial images demonstrate a biliary stent in place in addition to a drain within the right upper quadrant. No injection of contrast was visualized. The images provided do not appear to demonstrate cannulation of the common bile duct. IMPRESSION: ERCP. Please see separate operative report for complete intraoperative details. These images were submitted for radiologic interpretation only. Please see the procedural report for the amount of contrast and the fluoroscopy time utilized. Electronically Signed   By: Constance Holster M.D.   On: 05/17/2020 15:30    ASSESSMENT  Mr. Crumby is a 64 y.o. male  with a pmh significant for nephrolithiasis, hypertension, insomnia, complicated symptomatic gallbladder disease (status post ERCP for choledocholithiasis and subsequent cholecystectomy (with persistent bile leak), post ERCP pancreatitis.  Patient is hemodynamically stable. Leukocytosis progressing. No bile output from drain. CT scan reviewed with IR. After discussion we will plan to proceed with IR evaluation today v tomorrow for percutaneous drain injection/replacement-upsizing to ensure that it is in adequate positioning though CT suggests that it is. Will get diagnostic/partial therapeutic tap of the ascites to rule out infection and bile and pancreatic enzymes. Potential need for percutaneous drainage catheter placement into the pancreas if patient develops fevers and progressive changes. His nutrition is poor and likely will remain so with the progressive swelling/peripancreatic fluid collections that are present. Greater than 30 minute discussion and review of imaging with the patient and wife. Will continue antibiotics for now as well. Prognosis was improving, but now concern for progressive issues if leukocytosis progresses.   PLAN/RECOMMENDATIONS  IR consultation -Percutaneous drain replacement/upsize with injection to evaluate for  persistence of biliary leak -Diagnostic and potential therapeutic paracentesis of abdominal ascites --Cell count, Culture, TP, Albumin, LDH, Amylase or Lipase, Bilirubin to be obtained Continue current antibiotics Monitor closely over weekend as he has high risk of decompensation Appreciate surgical service follow up as well If LFT abnormalities do not improve and he develops biliary ductal dilation then PBD placement may be required Patient may likely need TPN as I am not clear that post-duodenal cortrak will be able to be placed currently Nutrition evaluation and calorie counts over weekend, otherwise low threshold to get on TPN   Please page/call with questions or concerns.   Justice Britain, MD [facility] Office # [phone]    LOS: 21 days  Irving Copas  05/18/2020, 2:11 PM

## 2020-05-18 NOTE — Plan of Care (Signed)

## 2020-05-18 NOTE — Progress Notes (Signed)
PROGRESS NOTE    Adam Fowler  PTW:[id number] DOB: 10/25/1955 DOA: 04/26/2020 PCP: Melony Overly, MD    Brief Narrative:  Patient admitted to the hospital working diagnosis of post ERCP acute pancreatitis.Prolonged hospitalization with multiple complications, including respiratory failure (required mechanical ventilation), AKI, encephalopathy and atrial fibrillation.  64 year old male with past medical history significant for hypertension. Heunderwent ERCP on 8/18 and biliary stent exchange for persistent postoperative bile leak,related to recent cholecystectomy with abscess status post percutaneous drainage. Patient developed severe epigastric abdominal pain, associated with nausea, and vomiting after ERCP procedure. On his initial physical examination blood pressure 137/88, heart rate 114, respiratoryrate23, oxygen saturation 92%,his lungs were clear to auscultation bilaterally, heart S1-S2, present, tachycardic, his abdomen was tender to palpation,itwas distended, no rebound or guarding,no lower extremity edema.  CT of the abdomen pelvis show changes consistent with acute pancreatitis, no abscess or drainable fluid collection. Patient was admitted to the medical ward, he received supportive medical therapy including intravenous fluids, analgesics and IV antibiotics.   His hospitalization has been complicated by metabolic encephalopathy,acute hypoxicrespiratory failure, renal failure, new onset paroxysmal atrial fibrillation and acute ileus.  08/21-08/22 rapid response due to increase work of breathing and atrial fibrillation, required invasive mechanical ventilation. He was treated for volume overload and aspiration pneumonia. Received diuresis and antibiotic therapy with Zosyn.   Liberated from mechanical ventilation on 08/24 and transitioned to nasal cannula with good toleration.   Patient continue to have leukocytosis and abdominal pain, repeat CT of the abdomen  pelvis(8/25)markedly diffuse pancreatic inflammation with fat stranding and free fluid.  His antibiotic therapy was switched from Zosyn to meropenem on 8/27. Hewasplaced on tube feeds via NG tubefor several days.  On 09/03 Ng tube was removed, patient tolerating nutritional support with by mouth intake.  Slowly patient improving, but continue to have high output biliary drain, despite stent in appropriate location and stable liver enzymes. 09/05.CT abdomen with sings of sequela of severe pancreatitis. Stable positioning of internal biliary stent with no intrahepatic biliary duct dilatation.  HIDA scanfrom 09/07 with persistent bile leak accumulating in the lateral aspect of the right upper quadrant, paracolic gutter.  Patient with very poor oral intake.  09/09 unsuccessful ERCP scope not able to pass beyond D1/D2 sweep.   CT abdomen and pelvis 09/09 with pancreatitis (peripancreatic fat stranding and peripancreatic fluid, percutaneous drain at the gallbladder fossa, unchanged biliary stent with no intrahepatic biliary duct dilatation, bilateral pleural effusions.    Assessment & Plan:   Principal Problem:   Post-ERCP acute pancreatitis Active Problems:   Severe sepsis (HCC)   Polycythemia   AKI (acute kidney injury) ([facility])   Metabolic acidosis   Acute respiratory failure with hypoxia (HCC)   Bilateral pleural effusion   Ileus (HCC)   Postoperative bile leak   1. Acute post ERCP pancreatitis/ ileus.Persistent biliary leak per HIDA scan. Patient had significant gastric fluid retention, related to functional outlet obstruction. Now has an NG tube connected to intermittent suction, out put 2,200 ml of clear liquid. Nausea and vomiting improved with NG tube suction.   Unsuccessful ERCP yesterday, CT scan with persistent pancreatitis. Patient continue with very poor oral intake. Patient has completed antibiotic therapy with meropenem, wbc is 31,3 up from 26,9,  possible reactive. Will hold on further antibiotic therapy for now. CT abdomen with no abscess or collection.   Will start patient on TPN for now. Continue pain control with oxycodone and hydrocodone. Follow with GI recommendations in regards of persistent biliary leak.  2. Acute hypoxic respiratory failure/bilateral pleural effusions more left than right, likely transudate. Continue as tolerated incentive spirometer as tolerated.  3. AKIwith hypokalemia.  Stable renal function with serum cr at 0.90 with K at 4,6 and serum bicarbonate at 25.    Start TPN.   4. Paroxysmal atrial fibrillation/ transitory.Converted to simus rhythm. Has a low risk of thrombosis CHADS vasc score of 1. Will hold on antithrombotic therapy for now. Now off telemetry for now.  6. Anemiaof chronic disease.Hgb at 11.5 and Hct at 35,6  7. Acute metabolic encephalopathy/ depression and anxiety.patinet has been more calm with improve anxiety, will continue withsertraline and as needed trazodone at night for sleep.   8. HTN/ dyslipidemia.( Athome on lisinopril and hctz combination).On pravastatin.Continue to hold on antihypertensive medications.   11. Transitory hyperglycemia. Hgb A1c 5.6.fasting glucose this am is 121. Continue to hold on insulin therapy for now.   12. Moderate calorie protein malnutrition.continue withnutritional supplementation.Continue to have very poor oral intake, if continue with nausea and vomiting post ERCP, will plan to resume tube feedings.   13. Depression and anxiety. Continue with sertraline and trazodone.   Patient continue to be at high risk for worsening bile leak   Status is: Inpatient  Remains inpatient appropriate because:IV treatments appropriate due to intensity of illness or inability to take PO   Dispo:  Patient From: Home  Planned Disposition: Home with Health Care Svc  Expected discharge date: 05/21/20  Medically stable for discharge:  No    DVT prophylaxis: enoxaparin   Code Status:   full  Family Communication:  No family at the bedside        Consultants:   GI   IR   Procedures:   ERCP   Antimicrobials:   Meropenem #14     Subjective: Patient has improved abdominal pain, nausea and vomiting, has NG tube in place to intermittent suction, no dyspnea or chest pain.   Objective: Vitals:   05/17/20 2034 05/18/20 0341 05/18/20 0500 05/18/20 1212  BP: (!) 115/92 122/75  114/86  Pulse: 100 99  92  Resp: 18 17  17   Temp: 97.8 F (36.6 C) (!) 97.5 F (36.4 C)  (!) 97.4 F (36.3 C)  TempSrc: Oral Oral  Oral  SpO2: 92% 90%  90%  Weight:   84.6 kg   Height:        Intake/Output Summary (Last 24 hours) at 05/18/2020 1351 Last data filed at 05/18/2020 0700 Gross per 24 hour  Intake 1813.78 ml  Output 2751 ml  Net -937.22 ml   Filed Weights   05/08/20 0357 05/13/20 0306 05/18/20 0500  Weight: 86.9 kg 86 kg 84.6 kg    Examination:   General: Not in pain or dyspnea, deconditioned  Neurology: Awake and alert, non focal  E ENT: mild pallor, no icterus, oral mucosa moist Cardiovascular: No JVD. S1-S2 present, rhythmic, no gallops, rubs, or murmurs. ++ pitting bilateral lower extremity edema. Pulmonary: positive breath sounds bilaterally, adequate air movement, no wheezing, rhonchi or rales. Gastrointestinal. Abdomen mild distended, not tender.  Skin. No rashes Musculoskeletal: no joint deformities     Data Reviewed: I have personally reviewed following labs and imaging studies  CBC: Recent Labs  Lab 05/11/20 1712 05/15/20 0614 05/16/20 1905 05/17/20 1812 05/18/20 0513  WBC 15.5* 16.6* 16.0* 26.9* 31.3*  NEUTROABS 12.3*  --  13.9* 26.4* 29.5*  HGB 11.7* 12.1* 12.0* 12.2* 11.5*  HCT 36.9* 37.7* 38.3* 38.9* 35.6*  MCV 88.7 88.5 88.0  87.6 87.5  PLT 438* 441* 360 335 675   Basic Metabolic Panel: Recent Labs  Lab 05/12/20 0334 05/15/20 0614 05/16/20 1905 05/17/20 1812  05/18/20 0513  NA 134* 134* 135 135 135  K 4.5 4.3 3.9 4.0 4.6  CL 102 98 99 99 98  CO2 23 24 24 25 25   GLUCOSE 134* 128* 121* 163* 152*  BUN 12 16 18 16 15   CREATININE 0.64 0.99 0.92 1.01 0.90  CALCIUM 8.4* 8.7* 8.5* 8.8* 8.3*   GFR: Estimated Creatinine Clearance: 82.9 mL/min (by C-G formula based on SCr of 0.9 mg/dL). Liver Function Tests: Recent Labs  Lab 05/12/20 0334 05/15/20 0614 05/16/20 1905 05/17/20 1812 05/18/20 0513  AST 22 26 37 88* 51*  ALT 18 20 24  60* 45*  ALKPHOS 72 118 424* 1,011* 753*  BILITOT 0.9 1.0 1.5* 2.2* 1.3*  PROT 4.6* 5.6* 5.6* 5.6* 5.2*  ALBUMIN 1.7* 2.0* 2.0* 2.0* 1.9*   Recent Labs  Lab 05/16/20 1905  LIPASE 27   No results for input(s): AMMONIA in the last 168 hours. Coagulation Profile: Recent Labs  Lab 05/16/20 1905  INR 1.3*   Cardiac Enzymes: No results for input(s): CKTOTAL, CKMB, CKMBINDEX, TROPONINI in the last 168 hours. BNP (last 3 results) No results for input(s): PROBNP in the last 8760 hours. HbA1C: No results for input(s): HGBA1C in the last 72 hours. CBG: Recent Labs  Lab 05/14/20 0634 05/14/20 1204 05/14/20 1636 05/14/20 1934 05/15/20 0647  GLUCAP 122* 119* 117* 124* 120*   Lipid Profile: No results for input(s): CHOL, HDL, LDLCALC, TRIG, CHOLHDL, LDLDIRECT in the last 72 hours. Thyroid Function Tests: No results for input(s): TSH, T4TOTAL, FREET4, T3FREE, THYROIDAB in the last 72 hours. Anemia Panel: No results for input(s): VITAMINB12, FOLATE, FERRITIN, TIBC, IRON, RETICCTPCT in the last 72 hours.    Radiology Studies: I have reviewed all of the imaging during this hospital visit personally     Scheduled Meds: . acetaminophen  650 mg Oral Q6H  . acidophilus  2 capsule Oral Daily  . calcium carbonate  1 tablet Oral TID WC  . Chlorhexidine Gluconate Cloth  6 each Topical Daily  . enoxaparin (LOVENOX) injection  40 mg Subcutaneous Q24H  . mouth rinse  15 mL Mouth Rinse BID  . metoCLOPramide  (REGLAN) injection  5 mg Intravenous TID AC  . multivitamin with minerals  1 tablet Oral Daily  . oxyCODONE  10 mg Oral Q12H  . pantoprazole (PROTONIX) IV  40 mg Intravenous Q12H  . polyethylene glycol  17 g Oral Daily  . pravastatin  20 mg Oral q1800  . sertraline  100 mg Oral Daily  . sodium chloride flush  10-40 mL Intracatheter Q12H  . traZODone  50 mg Oral QHS   Continuous Infusions: . sodium chloride Stopped (05/07/20 0923)  . dextrose 5% lactated ringers 50 mL/hr at 05/17/20 1719  . meropenem (MERREM) IV 1 g (05/18/20 4492)     LOS: 21 days        Aneira Cavitt Gerome Apley, MD

## 2020-05-18 NOTE — Progress Notes (Signed)
Physical Therapy Treatment Patient Details Name: Adam Fowler MRN: [id number] DOB: 06-25-1956 Today's Date: 05/18/2020    History of Present Illness Pt is a 64 yo male who presents to the ED with severe abdominal pain, nausea and vomiting. Found to have post ERCP acute pancreatits. Pt developed Hypoxic resp failure and was Intubated 8/22  and extubated 8/24. Pt also with new onset a fib and acute ileus. PMH includes HTN and ERCP.     PT Comments    Pt progressing steadily towards his physical therapy goals, demonstrating improved activity tolerance and ambulation distance. Pt voicing frustrations/concerns regarding extended hospital stay and diagnosis; PT provided supportive listening. Pt ambulating 300 feet with a walker at a min guard assist level. Will continue to progress mobility as tolerated.     Follow Up Recommendations  Home health PT;Supervision for mobility/OOB     Equipment Recommendations  None recommended by PT    Recommendations for Other Services       Precautions / Restrictions Precautions Precautions: Fall Precaution Comments: abdominal drain, NGT Restrictions Weight Bearing Restrictions: No    Mobility  Bed Mobility Overal bed mobility: Independent                Transfers Overall transfer level: Modified independent Equipment used: Rolling walker (2 wheeled)             General transfer comment: Cues for reaching back when descending to sitting position  Ambulation/Gait Ambulation/Gait assistance: Min guard Gait Distance (Feet): 300 Feet Assistive device: Rolling walker (2 wheeled) Gait Pattern/deviations: Step-through pattern;Decreased stride length;Trunk flexed Gait velocity: decreased   General Gait Details: Cues for walker proximity, min guard for safety, no overt LOB   Stairs             Wheelchair Mobility    Modified Rankin (Stroke Patients Only)       Balance Overall balance assessment: Needs  assistance Sitting-balance support: Feet supported;No upper extremity supported Sitting balance-Leahy Scale: Good     Standing balance support: Bilateral upper extremity supported;During functional activity Standing balance-Leahy Scale: Poor                              Cognition Arousal/Alertness: Awake/alert Behavior During Therapy: WFL for tasks assessed/performed Overall Cognitive Status: Within Functional Limits for tasks assessed                                        Exercises      General Comments        Pertinent Vitals/Pain Pain Assessment: Faces Faces Pain Scale: Hurts a little bit Pain Location: discomfort at NGT site Pain Descriptors / Indicators: Discomfort Pain Intervention(s): Monitored during session    Home Living                      Prior Function            PT Goals (current goals can now be found in the care plan section) Acute Rehab PT Goals Patient Stated Goal: go home Potential to Achieve Goals: Good Progress towards PT goals: Progressing toward goals    Frequency    Min 3X/week      PT Plan Current plan remains appropriate    Co-evaluation              AM-PAC PT "6 Clicks"  Mobility   Outcome Measure  Help needed turning from your back to your side while in a flat bed without using bedrails?: None Help needed moving from lying on your back to sitting on the side of a flat bed without using bedrails?: None Help needed moving to and from a bed to a chair (including a wheelchair)?: None Help needed standing up from a chair using your arms (e.g., wheelchair or bedside chair)?: None Help needed to walk in hospital room?: A Little Help needed climbing 3-5 steps with a railing? : A Little 6 Click Score: 22    End of Session   Activity Tolerance: Patient tolerated treatment well Patient left: in chair;with call bell/phone within reach Nurse Communication: Mobility status PT Visit  Diagnosis: Unsteadiness on feet (R26.81);Muscle weakness (generalized) (M62.81);Difficulty in walking, not elsewhere classified (R26.2)     Time: 1146-4314 PT Time Calculation (min) (ACUTE ONLY): 23 min  Charges:  $Gait Training: 8-22 mins $Therapeutic Activity: 8-22 mins                       Wyona Almas, PT, DPT Acute Rehabilitation Services Pager [phone] Office [phone]    Deno Etienne 05/18/2020, 8:54 AM

## 2020-05-19 LAB — CBC
HCT: 37.6 % — ABNORMAL LOW (ref 39.0–52.0)
Hemoglobin: 11.8 g/dL — ABNORMAL LOW (ref 13.0–17.0)
MCH: 27.8 pg (ref 26.0–34.0)
MCHC: 31.4 g/dL (ref 30.0–36.0)
MCV: 88.7 fL (ref 80.0–100.0)
Platelets: 306 10*3/uL (ref 150–400)
RBC: 4.24 MIL/uL (ref 4.22–5.81)
RDW: 14.6 % (ref 11.5–15.5)
WBC: 22.4 10*3/uL — ABNORMAL HIGH (ref 4.0–10.5)
nRBC: 0 % (ref 0.0–0.2)

## 2020-05-19 LAB — DIFFERENTIAL
Abs Immature Granulocytes: 0.28 10*3/uL — ABNORMAL HIGH (ref 0.00–0.07)
Basophils Absolute: 0.1 10*3/uL (ref 0.0–0.1)
Basophils Relative: 0 %
Eosinophils Absolute: 0.2 10*3/uL (ref 0.0–0.5)
Eosinophils Relative: 1 %
Immature Granulocytes: 1 %
Lymphocytes Relative: 5 %
Lymphs Abs: 1.1 10*3/uL (ref 0.7–4.0)
Monocytes Absolute: 0.8 10*3/uL (ref 0.1–1.0)
Monocytes Relative: 4 %
Neutro Abs: 19.8 10*3/uL — ABNORMAL HIGH (ref 1.7–7.7)
Neutrophils Relative %: 89 %

## 2020-05-19 LAB — COMPREHENSIVE METABOLIC PANEL
ALT: 29 U/L (ref 0–44)
AST: 24 U/L (ref 15–41)
Albumin: 1.9 g/dL — ABNORMAL LOW (ref 3.5–5.0)
Alkaline Phosphatase: 500 U/L — ABNORMAL HIGH (ref 38–126)
Anion gap: 12 (ref 5–15)
BUN: 15 mg/dL (ref 8–23)
CO2: 23 mmol/L (ref 22–32)
Calcium: 8.4 mg/dL — ABNORMAL LOW (ref 8.9–10.3)
Chloride: 97 mmol/L — ABNORMAL LOW (ref 98–111)
Creatinine, Ser: 0.84 mg/dL (ref 0.61–1.24)
GFR calc Af Amer: >60 mL/min (ref >60)
GFR calc non Af Amer: >60 mL/min (ref >60)
Glucose, Bld: 131 mg/dL — ABNORMAL HIGH (ref 70–99)
Potassium: 3.8 mmol/L (ref 3.5–5.1)
Sodium: 132 mmol/L — ABNORMAL LOW (ref 135–145)
Total Bilirubin: 1.1 mg/dL (ref 0.3–1.2)
Total Protein: 5.5 g/dL — ABNORMAL LOW (ref 6.5–8.1)

## 2020-05-19 LAB — GRAM STAIN

## 2020-05-19 LAB — GLUCOSE, CAPILLARY
Glucose-Capillary: 128 mg/dL — ABNORMAL HIGH (ref 70–99)
Glucose-Capillary: 159 mg/dL — ABNORMAL HIGH (ref 70–99)

## 2020-05-19 LAB — AMYLASE, PLEURAL OR PERITONEAL FLUID: Amylase, Fluid: 28 U/L

## 2020-05-19 LAB — PHOSPHORUS: Phosphorus: 3.4 mg/dL (ref 2.5–4.6)

## 2020-05-19 LAB — MAGNESIUM: Magnesium: 1.8 mg/dL (ref 1.7–2.4)

## 2020-05-19 LAB — TRIGLYCERIDES: Triglycerides: 145 mg/dL (ref <150)

## 2020-05-19 LAB — PREALBUMIN: Prealbumin: 10.1 mg/dL — ABNORMAL LOW (ref 18–38)

## 2020-05-19 MED ORDER — SODIUM CHLORIDE 0.9% FLUSH
10.0000 mL | INTRAVENOUS | Status: DC | PRN
  Administered 2020-05-19 – 2020-05-25 (×3): 10 mL

## 2020-05-19 MED ORDER — INSULIN ASPART 100 UNIT/ML ~~LOC~~ SOLN
0.0000 [IU] | SUBCUTANEOUS | Status: DC
  Administered 2020-05-19: 3 [IU] via SUBCUTANEOUS
  Administered 2020-05-19: 2 [IU] via SUBCUTANEOUS
  Administered 2020-05-20 (×2): 3 [IU] via SUBCUTANEOUS
  Administered 2020-05-20 (×2): 2 [IU] via SUBCUTANEOUS
  Administered 2020-05-20: 3 [IU] via SUBCUTANEOUS
  Administered 2020-05-20 – 2020-05-21 (×2): 2 [IU] via SUBCUTANEOUS
  Administered 2020-05-21 (×2): 3 [IU] via SUBCUTANEOUS
  Administered 2020-05-21: 2 [IU] via SUBCUTANEOUS
  Administered 2020-05-21: 3 [IU] via SUBCUTANEOUS
  Administered 2020-05-22 (×2): 2 [IU] via SUBCUTANEOUS

## 2020-05-19 MED ORDER — TRACE MINERALS CU-MN-SE-ZN 300-55-60-3000 MCG/ML IV SOLN
INTRAVENOUS | Status: AC
  Filled 2020-05-19: qty 416

## 2020-05-19 NOTE — Progress Notes (Signed)
PHARMACY - TOTAL PARENTERAL NUTRITION CONSULT NOTE   Indication: severe pancreatitis  Patient Measurements: Height: 5\' 9"  (175.3 cm) Weight: 84.6 kg (186 lb 8.2 oz) IBW/kg (Calculated) : 70.7 TPN AdjBW (KG): 86.1 Body mass index is 27.54 kg/m. Usual Weight:    Assessment: Admitted 04/26/20. Post-ERCP 8/18 and biliary stent exchange for persistent postoperative bile leak related to recent cholecystectomy with abscess status post percutaneous drainage. Developed acute pancreatitis.  - Prolonged hospitalization complicated by VDRF (extubated 8/24) , AKI, encephalopathy, and afib. - 9/3 tolerating some oral intake - 9/11: Still with very poor oral intake. Start TPN for Acute post ERCP pancreatitis/ ileus and persistent biliary leak. Significant gastric fluid retention from functional outlet obstruction. NG to suction  Glucose / Insulin: A1C 5.6. 152 onBMET Electrolytes: Not yet done 9/11. WNL yesterday. AdjCa 9.98 ( on Tums TID) Renal: Scr <1. UOP not recorded 9/10. LFTs / TGs: Elevated LFT's.  Prealbumin / albumin: Prealbumin 10.3. Albumin 1.9 Intake / Output; MIVF:  LBM 05/17/20. NG 2200>>900 ml out. Drains 300 out. I/O -1200.  GI Imaging: - 9/5: CT with signs of sequela of severe pancreatitis - 9/7: HIDA with persistent bile leak with accumulations and paracolic gutter. - 9/9: CT: pancreatitis (peripancreatic fat stranding and peripancreatic fluid, percutaneous drain at the gallbladder fossa, unchanged biliary stent with no intrahepatic biliary duct dilatation, bilateral pleural effusions.   Surgeries / Procedures:  - 8/18 ERCP and biliary stent exchange - 9/9: Unsuccessful ERCP   GI Meds: Acidophilus 2 daily, Reglan 5mg  IV TID, IV PPI/12h, Miralax daily,   Central access: PICC to be placed 9/11. TPN start date: 9/11  Nutritional Goals (per RD recommendation on 05/18/20): kCal: 2300-2500, Protein: 130-145, Fluid: >2.3L Goal TPN rate is 85 mL/hr concentrated (provides 132 g of  protein and 2325 kcals per day)  Current Nutrition: CLD  Plan:  Start TPN at 40 mL/hr at 1800 provides 62g AA, 19.2g lipids, 192g dextrose, and 1094 kcal. Electrolytes in TPN: 80mEq/L of Na, 46mEq/L of K, 0 Calcium, 58mEq/L of Mg, and 56mmol/L of Phos. Cl:Ac ratio 1:1 Add standard MVI and trace elements to TPN Initiate Sensitive q4h SSI and adjust as needed  Reduce MIVF to N/A Monitor TPN labs on Mon/Thurs, and PRN D/c oral MV and place in TPN PICC this PM   Tommy Goostree S. Alford Highland, PharmD, BCPS Clinical Staff Pharmacist [email or web address] Alford Highland, Ellyse Rotolo Stillinger 05/19/2020,8:58 AM

## 2020-05-19 NOTE — Progress Notes (Signed)
Tu RN aware of PICC placement plan to be done this pm

## 2020-05-19 NOTE — Progress Notes (Signed)
Peripherally Inserted Central Catheter Placement  The IV Nurse has discussed with the patient and/or persons authorized to consent for the patient, the purpose of this procedure and the potential benefits and risks involved with this procedure.  The benefits include less needle sticks, lab draws from the catheter, and the patient may be discharged home with the catheter. Risks include, but not limited to, infection, bleeding, blood clot (thrombus formation), and puncture of an artery; nerve damage and irregular heartbeat and possibility to perform a PICC exchange if needed/ordered by physician.  Alternatives to this procedure were also discussed.  Bard Power PICC patient education guide, fact sheet on infection prevention and patient information card has been provided to patient /or left at bedside.    PICC Placement Documentation  PICC Double Lumen 15/94/70 PICC Right Basilic 40 cm 0 cm (Active)  Indication for Insertion or Continuance of Line Administration of hyperosmolar/irritating solutions (i.e. TPN, Vancomycin, etc.) 05/19/20 1634  Exposed Catheter (cm) 0 cm 05/19/20 1634  Site Assessment Clean;Dry;Intact 05/19/20 1634  Lumen #1 Status Flushed;Saline locked;Blood return noted 05/19/20 1634  Lumen #2 Status Flushed;Saline locked;Blood return noted 05/19/20 1634  Dressing Type Transparent 05/19/20 1634  Dressing Status Clean;Dry;Intact 05/19/20 1634  Safety Lock Not Applicable 76/15/18 3437  Line Care Connections checked and tightened 05/19/20 1634  Dressing Intervention New dressing 05/19/20 1634  Dressing Change Due 05/26/20 05/19/20 [place], Nicolette Bang 05/19/2020, 4:35 PM

## 2020-05-19 NOTE — Plan of Care (Signed)
  Problem: Education: Goal: Knowledge of General Education information will improve Description: Including pain rating scale, medication(s)/side effects and non-pharmacologic comfort measures Outcome: Progressing   Problem: Health Behavior/Discharge Planning: Goal: Ability to manage health-related needs will improve Outcome: Progressing   Problem: Clinical Measurements: Goal: Ability to maintain clinical measurements within normal limits will improve Outcome: Progressing Goal: Will remain free from infection Outcome: Progressing Goal: Diagnostic test results will improve Outcome: Progressing Goal: Respiratory complications will improve Outcome: Progressing Goal: Cardiovascular complication will be avoided Outcome: Progressing   Problem: Coping: Goal: Level of anxiety will decrease Outcome: Progressing   Problem: Elimination: Goal: Will not experience complications related to bowel motility Outcome: Progressing Goal: Will not experience complications related to urinary retention Outcome: Progressing   Problem: Pain Managment: Goal: General experience of comfort will improve Outcome: Progressing   Problem: Safety: Goal: Ability to remain free from injury will improve Outcome: Progressing   Problem: Skin Integrity: Goal: Risk for impaired skin integrity will decrease Outcome: Progressing   Problem: Activity: Goal: Ability to tolerate increased activity will improve Outcome: Progressing   Problem: Cardiac: Goal: Ability to achieve and maintain adequate cardiopulmonary perfusion will improve Outcome: Progressing   Problem: Health Behavior/Discharge Planning: Goal: Ability to safely manage health-related needs after discharge will improve Outcome: Progressing   Problem: Activity: Goal: Ability to tolerate increased activity will improve Outcome: Progressing

## 2020-05-19 NOTE — Progress Notes (Signed)
PROGRESS NOTE    Adam Fowler  OIZ:[id number] DOB: May 28, 1956 DOA: 04/26/2020 PCP: Melony Overly, MD    Brief Narrative:  Patient admitted to the hospital working diagnosis of post ERCP acute pancreatitis.Prolonged hospitalization with multiple complications, including respiratory failure (required mechanical ventilation), AKI, encephalopathy and atrial fibrillation.  64 year old male with past medical history significant for hypertension. Heunderwent ERCP on 8/18 and biliary stent exchange for persistent postoperative bile leak,related to recent cholecystectomy with abscess status post percutaneous drainage. Patient developed severe epigastric abdominal pain, associated with nausea, and vomiting after ERCP procedure. On his initial physical examination blood pressure 137/88, heart rate 114, respiratoryrate23, oxygen saturation 92%,his lungs were clear to auscultation bilaterally, heart S1-S2, present, tachycardic, his abdomen was tender to palpation,itwas distended, no rebound or guarding,no lower extremity edema.  CT of the abdomen pelvis show changes consistent with acute pancreatitis, no abscess or drainable fluid collection. Patient was admitted to the medical ward, he received supportive medical therapy including intravenous fluids, analgesics and IV antibiotics.   His hospitalization has been complicated by metabolic encephalopathy,acute hypoxicrespiratory failure, renal failure, new onset paroxysmal atrial fibrillation and acute ileus.  08/21-08/22 rapid response due to increase work of breathing and atrial fibrillation, required invasive mechanical ventilation. He was treated for volume overload and aspiration pneumonia. Received diuresis and antibiotic therapy with Zosyn.   Liberated from mechanical ventilation on 08/24 and transitioned to nasal cannula with good toleration.   Patient continue to have leukocytosis and abdominal pain, repeat CT of the abdomen  pelvis(8/25)markedly diffuse pancreatic inflammation with fat stranding and free fluid.  His antibiotic therapy was switched from Zosyn to meropenem on 8/27. Hewasplaced on tube feeds via NG tubefor several days.  On 09/03 Ng tube was removed, patient tolerating nutritional support with by mouth intake.  Slowly patient improving, but continue to have high output biliary drain, despite stent in appropriate location and stable liver enzymes. 09/05.CT abdomen with sings of sequela of severe pancreatitis. Stable positioning of internal biliary stent with no intrahepatic biliary duct dilatation.  HIDA scanfrom 09/07 with persistent bile leak accumulating in the lateral aspect of the right upper quadrant, paracolic gutter.  Patient with very poor oral intake.  09/09 unsuccessful ERCP scope not able to pass beyond D1/D2 sweep. Patient had significant gastric fluid retention, related to functional outlet obstruction. NG tube inserted and connected to intermittent suction.   CT abdomen and pelvis 09/09 with pancreatitis (peripancreatic fat stranding and peripancreatic fluid), percutaneous drain at the gallbladder fossa, unchanged biliary stent with no intrahepatic biliary duct dilatation, bilateral pleural effusions.   09/10 Ordered PICC line to be placed to start TPN.  Because a decreased biliary drain output and positive ascites, IR has been re-consulted for percutaneous drain replacement of upsize, plus diagnostic paracentesis.    Assessment & Plan:   Principal Problem:   Post-ERCP acute pancreatitis Active Problems:   Severe sepsis (HCC)   Polycythemia   AKI (acute kidney injury) ([place])   Metabolic acidosis   Acute respiratory failure with hypoxia (HCC)   Bilateral pleural effusion   Ileus (HCC)   Postoperative bile leak    1. Acute post ERCP pancreatitis/ ileus.Persistent biliaryleak per HIDA scan. Persistnet leokocytosis, but trending down to 22,4, he has  completed antibiotic therapy with meropenem for 14 days.  No deep collection per abdominal pelvic imaging.   Continue with supportive medical care, with analgesics and TPN. Patient will have IR re-evaluation of biliary drain and diagnostic paracentesis today. For now continue with NG tube  to suction.   Pain control with acetaminophen, hydromorphone, oxycodone IR and SR.  2. Acute hypoxic respiratory failure/bilateral pleural effusions more left than right, likely transudate. On incentive spirometer as tolerated.  3. AKIwith hypokalemia. patient with very poor oral intake, to start on TPN, his renal function ad electrolytes are stable.    4. Paroxysmal atrial fibrillation/ transitory.Converted to simus rhythm. Has a low risk of thrombosis CHADS vasc score of 1. Will hold on antithrombotic therapy for now. Now off telemetry for now.  6. Anemiaof chronic disease.Continue to be stable   7. Acute metabolic encephalopathy/ depression and anxiety.On sertralineand as needed trazodone at night for sleep. Continue pain control. Today he is calm and cooperative, no confusion.   8. HTN/ dyslipidemia.( Athome on lisinopril and hctz combination).Contine withpravastatin. Hold antihypertensive medications.   11. Transitory hyperglycemia. Hgb A1c 5.6.fasting glucose this am is 131.   12. Moderate calorie protein malnutrition.plan to start patient on TPN. .  13. Depression and anxiety.  On sertraline and trazodone.     Patient continue to be at high risk for worsening biliary leak   Status is: Inpatient  Remains inpatient appropriate because:IV treatments appropriate due to intensity of illness or inability to take PO   Dispo:  Patient From: Home  Planned Disposition: Home with Health Care Svc  Expected discharge date: 05/21/20  Medically stable for discharge: No   DVT prophylaxis:  enoxaparin   Code Status:   full  Family Communication:  No family at  the bedside      Consultants:   GI   IR   surgery   Procedures:   ERCP  Antimicrobials:   Meropenem for 14 days.     Subjective: Patient continue to have abdominal pain, that improved with analgesics, no nausea or vomiting, no dyspnea, but continue to be very weak and deconditioned.   Objective: Vitals:   05/18/20 1212 05/18/20 1949 05/19/20 0415 05/19/20 0941  BP: 114/86 137/81 126/77 115/79  Pulse: 92 87 80 95  Resp: 17 18 17    Temp: (!) 97.4 F (36.3 C) 97.6 F (36.4 C) 97.7 F (36.5 C) 98 F (36.7 C)  TempSrc: Oral Oral Oral Oral  SpO2: 90% 92% 92% 93%  Weight:      Height:        Intake/Output Summary (Last 24 hours) at 05/19/2020 1222 Last data filed at 05/19/2020 0500 Gross per 24 hour  Intake --  Output 1200 ml  Net -1200 ml   Filed Weights   05/08/20 0357 05/13/20 0306 05/18/20 0500  Weight: 86.9 kg 86 kg 84.6 kg    Examination:   General: Not in pain or dyspnea, deconditioned  Neurology: Awake and alert, non focal  E ENT: mild pallor, no icterus, oral mucosa moist Cardiovascular: No JVD. S1-S2 present, rhythmic, no gallops, rubs, or murmurs. No lower extremity edema. Pulmonary: vesicular breath sounds bilaterally, adequate air movement, no wheezing, rhonchi or rales. Gastrointestinal. Abdomen distended but not tender to superficial palpation, no rebound or guarding.  Skin. No rashes Musculoskeletal: no joint deformities     Data Reviewed: I have personally reviewed following labs and imaging studies  CBC: Recent Labs  Lab 05/15/20 0614 05/16/20 1905 05/17/20 1812 05/18/20 0513  WBC 16.6* 16.0* 26.9* 31.3*  NEUTROABS  --  13.9* 26.4* 29.5*  HGB 12.1* 12.0* 12.2* 11.5*  HCT 37.7* 38.3* 38.9* 35.6*  MCV 88.5 88.0 87.6 87.5  PLT 441* 360 335 623   Basic Metabolic Panel: Recent Labs  Lab  05/15/20 0614 05/16/20 1905 05/17/20 1812 05/18/20 0513  NA 134* 135 135 135  K 4.3 3.9 4.0 4.6  CL 98 99 99 98  CO2 24 24 25 25    GLUCOSE 128* 121* 163* 152*  BUN 16 18 16 15   CREATININE 0.99 0.92 1.01 0.90  CALCIUM 8.7* 8.5* 8.8* 8.3*   GFR: Estimated Creatinine Clearance: 82.9 mL/min (by C-G formula based on SCr of 0.9 mg/dL). Liver Function Tests: Recent Labs  Lab 05/15/20 0614 05/16/20 1905 05/17/20 1812 05/18/20 0513  AST 26 37 88* 51*  ALT 20 24 60* 45*  ALKPHOS 118 424* 1,011* 753*  BILITOT 1.0 1.5* 2.2* 1.3*  PROT 5.6* 5.6* 5.6* 5.2*  ALBUMIN 2.0* 2.0* 2.0* 1.9*   Recent Labs  Lab 05/16/20 1905  LIPASE 27   No results for input(s): AMMONIA in the last 168 hours. Coagulation Profile: Recent Labs  Lab 05/16/20 1905  INR 1.3*   Cardiac Enzymes: No results for input(s): CKTOTAL, CKMB, CKMBINDEX, TROPONINI in the last 168 hours. BNP (last 3 results) No results for input(s): PROBNP in the last 8760 hours. HbA1C: No results for input(s): HGBA1C in the last 72 hours. CBG: Recent Labs  Lab 05/14/20 0634 05/14/20 1204 05/14/20 1636 05/14/20 1934 05/15/20 0647  GLUCAP 122* 119* 117* 124* 120*   Lipid Profile: No results for input(s): CHOL, HDL, LDLCALC, TRIG, CHOLHDL, LDLDIRECT in the last 72 hours. Thyroid Function Tests: No results for input(s): TSH, T4TOTAL, FREET4, T3FREE, THYROIDAB in the last 72 hours. Anemia Panel: No results for input(s): VITAMINB12, FOLATE, FERRITIN, TIBC, IRON, RETICCTPCT in the last 72 hours.    Radiology Studies: I have reviewed all of the imaging during this hospital visit personally     Scheduled Meds: . acetaminophen  650 mg Oral Q6H  . acidophilus  2 capsule Oral Daily  . calcium carbonate  1 tablet Oral TID WC  . Chlorhexidine Gluconate Cloth  6 each Topical Daily  . enoxaparin (LOVENOX) injection  40 mg Subcutaneous Q24H  . insulin aspart  0-15 Units Subcutaneous Q4H  . mouth rinse  15 mL Mouth Rinse BID  . metoCLOPramide (REGLAN) injection  5 mg Intravenous TID AC  . oxyCODONE  10 mg Oral Q12H  . pantoprazole (PROTONIX) IV  40 mg  Intravenous Q12H  . polyethylene glycol  17 g Oral Daily  . pravastatin  20 mg Oral q1800  . sertraline  100 mg Oral Daily  . sodium chloride flush  10-40 mL Intracatheter Q12H  . traZODone  50 mg Oral QHS   Continuous Infusions: . sodium chloride Stopped (05/07/20 0923)  . TPN ADULT (ION)       LOS: 22 days        Kato Wieczorek Gerome Apley, MD

## 2020-05-20 LAB — CBC
HCT: 34.3 % — ABNORMAL LOW (ref 39.0–52.0)
Hemoglobin: 10.8 g/dL — ABNORMAL LOW (ref 13.0–17.0)
MCH: 27.3 pg (ref 26.0–34.0)
MCHC: 31.5 g/dL (ref 30.0–36.0)
MCV: 86.8 fL (ref 80.0–100.0)
Platelets: 233 10*3/uL (ref 150–400)
RBC: 3.95 MIL/uL — ABNORMAL LOW (ref 4.22–5.81)
RDW: 14.4 % (ref 11.5–15.5)
WBC: 26.4 10*3/uL — ABNORMAL HIGH (ref 4.0–10.5)
nRBC: 0 % (ref 0.0–0.2)

## 2020-05-20 LAB — BASIC METABOLIC PANEL
Anion gap: 8 (ref 5–15)
BUN: 13 mg/dL (ref 8–23)
CO2: 26 mmol/L (ref 22–32)
Calcium: 8.1 mg/dL — ABNORMAL LOW (ref 8.9–10.3)
Chloride: 97 mmol/L — ABNORMAL LOW (ref 98–111)
Creatinine, Ser: 0.7 mg/dL (ref 0.61–1.24)
GFR calc Af Amer: >60 mL/min (ref >60)
GFR calc non Af Amer: >60 mL/min (ref >60)
Glucose, Bld: 135 mg/dL — ABNORMAL HIGH (ref 70–99)
Potassium: 3.7 mmol/L (ref 3.5–5.1)
Sodium: 131 mmol/L — ABNORMAL LOW (ref 135–145)

## 2020-05-20 LAB — GLUCOSE, CAPILLARY
Glucose-Capillary: 130 mg/dL — ABNORMAL HIGH (ref 70–99)
Glucose-Capillary: 135 mg/dL — ABNORMAL HIGH (ref 70–99)
Glucose-Capillary: 135 mg/dL — ABNORMAL HIGH (ref 70–99)
Glucose-Capillary: 153 mg/dL — ABNORMAL HIGH (ref 70–99)
Glucose-Capillary: 155 mg/dL — ABNORMAL HIGH (ref 70–99)
Glucose-Capillary: 158 mg/dL — ABNORMAL HIGH (ref 70–99)

## 2020-05-20 LAB — MAGNESIUM: Magnesium: 2 mg/dL (ref 1.7–2.4)

## 2020-05-20 LAB — PHOSPHORUS: Phosphorus: 2.4 mg/dL — ABNORMAL LOW (ref 2.5–4.6)

## 2020-05-20 MED ORDER — TRACE MINERALS CU-MN-SE-ZN 300-55-60-3000 MCG/ML IV SOLN
INTRAVENOUS | Status: AC
  Filled 2020-05-20: qty 676

## 2020-05-20 NOTE — Progress Notes (Signed)
PHARMACY - TOTAL PARENTERAL NUTRITION CONSULT NOTE   Indication: severe pancreatitis  Patient Measurements: Height: 5\' 9"  (175.3 cm) Weight: 84.6 kg (186 lb 8.2 oz) IBW/kg (Calculated) : 70.7 TPN AdjBW (KG): 86.1 Body mass index is 27.54 kg/m. Usual Weight:    Assessment: Admitted 04/26/20. Post-ERCP 8/18 and biliary stent exchange for persistent postoperative bile leak related to recent cholecystectomy with abscess status post percutaneous drainage. Developed acute pancreatitis.  - Prolonged hospitalization complicated by VDRF (extubated 8/24) , AKI, encephalopathy, and afib. - 9/11: Still with very poor oral intake. Start TPN for Acute post ERCP pancreatitis/ ileus and persistent biliary leak. Significant gastric fluid retention from functional outlet obstruction. NG to suction   Glucose / Insulin: A1C 5.6. CBGs <180. 9 units SSI used Electrolytes: Na 131 low. AdjCa 9.78 ( on Tums TID) Renal: Scr <1.  LFTs / TGs: Elevated AST/ALT resolved. Alkphos 753>500 Prealbumin / albumin: Prealbumin 10.1. Albumin 1.9 Intake / Output; MIVF:  LBM 05/17/20. NG 300 down. Drains 250 out. I/O -305. - 9/11 po intake 370 of CLD started in PM  GI Imaging: - 9/5: CT with signs of sequela of severe pancreatitis - 9/7: HIDA with persistent bile leak with accumulations and paracolic gutter. - 9/9: CT: pancreatitis (peripancreatic fat stranding and peripancreatic fluid, percutaneous drain at the gallbladder fossa, unchanged biliary stent with no intrahepatic biliary duct dilatation, bilateral pleural effusions.   Surgeries / Procedures:  - 8/18 ERCP and biliary stent exchange - 9/9: Unsuccessful ERCP   GI Meds: Acidophilus 2 daily, Reglan 5mg  IV TID, IV PPI/12h, Miralax daily,   Central access: PICC to be placed 9/11. TPN start date: 9/11  Nutritional Goals (per RD recommendation on 05/18/20): kCal: 2300-2500, Protein: 130-145, Fluid: >2.3L Goal TPN rate is 85 mL/hr concentrated (provides 132 g of  protein and 2325 kcals per day)  Current Nutrition: CLD  Plan:  Increase TPN to 60 mL/hr at 1800 provides 101.4g AA, 31.2g lipids, 312g dextrose, and 1778 kcal. - Will not push TPN higher today due to added clear liquid diet. Electrolytes in TPN: 72mEq/L of Na, 39mEq/L of K, 5 Calcium, 9mEq/L of Mg, and 46mmol/L of Phos. Cl:Ac ratio 1:1 Add standard MVI and trace elements to TPN Initiate Moderate q4h SSI and adjust as needed  Reduce MIVF to N/A Monitor TPN labs on Mon/Thurs, and PRN D/c oral MV and place in TPN Change meds to po (MV, Reglan, PPI) when tolerating diet reliably.   Adam Fowler S. Alford Highland, PharmD, BCPS Clinical Staff Pharmacist [email or web address] Alford Highland, Eddison Searls Stillinger 05/20/2020,8:20 AM

## 2020-05-20 NOTE — Progress Notes (Addendum)
PROGRESS NOTE    Adam Fowler  HUO:[id number] DOB: 1955-11-19 DOA: 04/26/2020 PCP: Melony Overly, MD    Brief Narrative:  Patient admitted to the hospital working diagnosis of post ERCP acute pancreatitis.Prolonged hospitalization with multiple complications, including respiratory failure (required mechanical ventilation), AKI, encephalopathy and atrial fibrillation.  64 year old male with past medical history significant for hypertension. Heunderwent ERCP on 8/18 and biliary stent exchange for persistent postoperative bile leak,related to recent cholecystectomy with abscess status post percutaneous drainage. Patient developed severe epigastric abdominal pain, associated with nausea, and vomiting after ERCP procedure. On his initial physical examination blood pressure 137/88, heart rate 114, respiratoryrate23, oxygen saturation 92%,his lungs were clear to auscultation bilaterally, heart S1-S2, present, tachycardic, his abdomen was tender to palpation,itwas distended, no rebound or guarding,no lower extremity edema.  CT of the abdomen pelvis show changes consistent with acute pancreatitis, no abscess or drainable fluid collection. Patient was admitted to the medical ward, he received supportive medical therapy including intravenous fluids, analgesics and IV antibiotics.   His hospitalization has been complicated by metabolic encephalopathy,acute hypoxicrespiratory failure, renal failure, new onset paroxysmal atrial fibrillation and acute ileus.  08/21-08/22 rapid response due to increase work of breathing and atrial fibrillation, required invasive mechanical ventilation. He was treated for volume overload and aspiration pneumonia. Received diuresis and antibiotic therapy with Zosyn.   Liberated from mechanical ventilation on 08/24 and transitioned to nasal cannula with good toleration.   Patient continue to have leukocytosis and abdominal pain, repeat CT of the abdomen  pelvis(8/25)markedly diffuse pancreatic inflammation with fat stranding and free fluid.  His antibiotic therapy was switched from Zosyn to meropenem on 8/27. Hewasplaced on tube feeds via NG tubefor several days.  On 09/03 Ng tube was removed, patient tolerating nutritional support with by mouth intake.  Slowly patient improving, but continue to have high output biliary drain, despite stent in appropriate location and stable liver enzymes. 09/05.CT abdomen with sings of sequela of severe pancreatitis. Stable positioning of internal biliary stent with no intrahepatic biliary duct dilatation.  HIDA scanfrom 09/07 with persistent bile leak accumulating in the lateral aspect of the right upper quadrant, paracolic gutter.  Patient with very poor oral intake.  09/09 unsuccessful ERCP scope not able to pass beyond D1/D2 sweep. Patient had significant gastric fluid retention, related to functional outlet obstruction. NG tube inserted and connected to intermittent suction.   CT abdomen and pelvis 09/09 with pancreatitis (peripancreatic fat stranding and peripancreatic fluid), percutaneous drain at the gallbladder fossa, unchanged biliary stent with no intrahepatic biliary duct dilatation, bilateral pleural effusions.   09/10 Ordered PICC line to be placed to start TPN.  Because a decreased biliary drain output and positive ascites, IR has been re-consulted for percutaneous drain replacement of upsize, plus diagnostic paracentesis.  Drain tube well-positioned and normally functioning, the fistulous communication between the drainage catheter and the cystic duct appears to be nearly resolved.   1.4 L of bloody ascitic fluids removed.    Assessment & Plan:   Principal Problem:   Post-ERCP acute pancreatitis Active Problems:   Severe sepsis (HCC)   Polycythemia   AKI (acute kidney injury) ([place])   Metabolic acidosis   Acute respiratory failure with hypoxia (HCC)   Bilateral  pleural effusion   Ileus (HCC)   Postoperative bile leak   1. Acute post ERCP pancreatitis/ ileus.Persistent biliaryleak per HIDA scan.The tube drainage has been revised and working properly/ unsuccessfully repeat ERCP (09/09).  Wbc trending down 22 from 31. Completed antibiotic therapy with  meropenem.  Biliary drain output over last 24 has increased again at 250 ml over 24H. Wbc has been trending down. Patient continue to have NG tube to intermitten suction 300 cc output documented over last 24 H.   Continue supportive medical care with analgesic acetaminophen, hydromorphone, oxycodone IR and SR. Nutritional support per TPN.   Out of bed to chair and physical/ occupational therapy to prevent further physical deterioration.   2. Acute hypoxic respiratory failure/bilateral pleural effusions more left than right, likely transudate. Continue with airway clearing techniques with incentive spirometer.  3. AKIwith hypokalemia.stable renal function with serum cr at 073, with K at 3,7 and serum bicarbonate at 26.   4. Paroxysmal atrial fibrillation/ transitory.Converted to simus rhythm. Has a low risk of thrombosis CHADS vasc score of 1. Will hold on antithrombotic therapy for now. Now off telemetry for now.  6. Anemiaof chronic disease.Hgb at 11.8 and Hct at 37.6  7. Acute metabolic encephalopathy/ depression and anxiety.continue with sertralineand as needed trazodone at night for sleep.  8. HTN/ dyslipidemia.( Athome on lisinopril and hctz combination).Contine withpravastatin. Holding antihypertensive medications to prevent hypotension.   11. Transitory hyperglycemia. Hgb A1c 5.6.fasting glucose this am is 135, continue nutritional support with TPN.    12. Moderate calorie protein malnutrition.Continue with TPN per pharmacy protocol.   13. Depression and anxiety.  Continue with sertraline and trazodone.    Patient continue to be at high risk for  worsening biliary leak   Status is: Inpatient  Remains inpatient appropriate because:IV treatments appropriate due to intensity of illness or inability to take PO   Dispo:  Patient From: Home  Planned Disposition: Home with Health Care Svc  Expected discharge date: not yet determined    Medically stable for discharge: No  DVT prophylaxis: Enoxaparin   Code Status:   full  Family Communication:  No family at the bedside      Nutrition Status: clear liquids  Consultants:   GI   Procedures:   ERCP  Paracentesis   Antimicrobials:   Completed meropenem.     Subjective: Patient continue to have abdominal pain that is improved with analgesics, no further nausea or vomiting, continue to have NG tube in place, connected to low intermittent suction.   Objective: Vitals:   05/19/20 0941 05/19/20 1433 05/19/20 2002 05/20/20 0439  BP: 115/79 115/69 139/82 137/82  Pulse: 95 94 98 94  Resp:   16 16  Temp: 98 F (36.7 C) 98.2 F (36.8 C) 97.6 F (36.4 C) 98.4 F (36.9 C)  TempSrc: Oral Oral Oral Oral  SpO2: 93% 91% 91% 91%  Weight:      Height:        Intake/Output Summary (Last 24 hours) at 05/20/2020 1149 Last data filed at 05/19/2020 1831 Gross per 24 hour  Intake 250 ml  Output 675 ml  Net -425 ml   Filed Weights   05/08/20 0357 05/13/20 0306 05/18/20 0500  Weight: 86.9 kg 86 kg 84.6 kg    Examination:   General: Not in pain or dyspnea, deconditioned  Neurology: Awake and alert, non focal  E ENT: positive pallor, no icterus, oral mucosa moist/ ng tube in place.  Cardiovascular: No JVD. S1-S2 present, rhythmic, no gallops, rubs, or murmurs. No lower extremity edema. Pulmonary: vesicular breath sounds bilaterally, adequate air movement, no wheezing, rhonchi or rales. Gastrointestinal. Abdomen distended, not tender to superficial palpation, no rebound or guarding.  Skin. No rashes Musculoskeletal: no joint deformities     Data  Reviewed: I have  personally reviewed following labs and imaging studies  CBC: Recent Labs  Lab 05/15/20 0614 05/16/20 1905 05/17/20 1812 05/18/20 0513 05/19/20 1214  WBC 16.6* 16.0* 26.9* 31.3* 22.4*  NEUTROABS  --  13.9* 26.4* 29.5* 19.8*  HGB 12.1* 12.0* 12.2* 11.5* 11.8*  HCT 37.7* 38.3* 38.9* 35.6* 37.6*  MCV 88.5 88.0 87.6 87.5 88.7  PLT 441* 360 335 332 272   Basic Metabolic Panel: Recent Labs  Lab 05/16/20 1905 05/17/20 1812 05/18/20 0513 05/19/20 1214 05/20/20 0314  NA 135 135 135 132* 131*  K 3.9 4.0 4.6 3.8 3.7  CL 99 99 98 97* 97*  CO2 24 25 25 23 26   GLUCOSE 121* 163* 152* 131* 135*  BUN 18 16 15 15 13   CREATININE 0.92 1.01 0.90 0.84 0.70  CALCIUM 8.5* 8.8* 8.3* 8.4* 8.1*  MG  --   --   --  1.8 2.0  PHOS  --   --   --  3.4 2.4*   GFR: Estimated Creatinine Clearance: 93.3 mL/min (by C-G formula based on SCr of 0.7 mg/dL). Liver Function Tests: Recent Labs  Lab 05/15/20 0614 05/16/20 1905 05/17/20 1812 05/18/20 0513 05/19/20 1214  AST 26 37 88* 51* 24  ALT 20 24 60* 45* 29  ALKPHOS 118 424* 1,011* 753* 500*  BILITOT 1.0 1.5* 2.2* 1.3* 1.1  PROT 5.6* 5.6* 5.6* 5.2* 5.5*  ALBUMIN 2.0* 2.0* 2.0* 1.9* 1.9*   Recent Labs  Lab 05/16/20 1905  LIPASE 27   No results for input(s): AMMONIA in the last 168 hours. Coagulation Profile: Recent Labs  Lab 05/16/20 1905  INR 1.3*   Cardiac Enzymes: No results for input(s): CKTOTAL, CKMB, CKMBINDEX, TROPONINI in the last 168 hours. BNP (last 3 results) No results for input(s): PROBNP in the last 8760 hours. HbA1C: No results for input(s): HGBA1C in the last 72 hours. CBG: Recent Labs  Lab 05/19/20 1706 05/19/20 2009 05/20/20 0004 05/20/20 0441 05/20/20 0829  GLUCAP 128* 159* 130* 135* 135*   Lipid Profile: Recent Labs    05/19/20 1214  TRIG 145   Thyroid Function Tests: No results for input(s): TSH, T4TOTAL, FREET4, T3FREE, THYROIDAB in the last 72 hours. Anemia Panel: No results for input(s):  VITAMINB12, FOLATE, FERRITIN, TIBC, IRON, RETICCTPCT in the last 72 hours.    Radiology Studies: I have reviewed all of the imaging during this hospital visit personally     Scheduled Meds: . acetaminophen  650 mg Oral Q6H  . acidophilus  2 capsule Oral Daily  . calcium carbonate  1 tablet Oral TID WC  . Chlorhexidine Gluconate Cloth  6 each Topical Daily  . enoxaparin (LOVENOX) injection  40 mg Subcutaneous Q24H  . insulin aspart  0-15 Units Subcutaneous Q4H  . mouth rinse  15 mL Mouth Rinse BID  . metoCLOPramide (REGLAN) injection  5 mg Intravenous TID AC  . oxyCODONE  10 mg Oral Q12H  . pantoprazole (PROTONIX) IV  40 mg Intravenous Q12H  . polyethylene glycol  17 g Oral Daily  . pravastatin  20 mg Oral q1800  . sertraline  100 mg Oral Daily  . sodium chloride flush  10-40 mL Intracatheter Q12H  . traZODone  50 mg Oral QHS   Continuous Infusions: . sodium chloride Stopped (05/07/20 0923)  . TPN ADULT (ION) 40 mL/hr at 05/19/20 1803  . TPN ADULT (ION)       LOS: 23 days        Adam Rodrigues Gerome Apley,  MD   

## 2020-05-20 NOTE — Progress Notes (Addendum)
[facility] Subjective: June Vacha is a 64 year old white male with post ERCP pancreatitis, that developed after he had laparoscopic cholecystectomy done on 01/24/2020 [pathology revealed acute and chronic cholecystitis with necrosis] resulting in a prolonged hospitalization requiring mechanical ventilation complicated by acute kidney injury encephalopathy and atrial fibrillation. Due to ongoing symptoms with abdominal pain and leukocytosis a repeat CT scan done on 05/02/2020 revealed mild diffuse pancreatic inflammation with fat stranding and free fluid around the pancreas. He was switched to Zosyn for meropenem and 05/04/2020 and tube feeds were started. Patient's leukocytosis has improved somewhat as of yesterday with a white count down from 30 1.3K to 22.4K.  Patient however continues to have worsening abdominal pain and increased output from his biliary drain. His AST and ALT have normalized but his alkaline phosphatase is high at 500 which is down from 1011 couple of days ago.  He denies having any fever chills.  He had a HIDA scan done on 05/15/2020 that revealed a persistent leak in the lateral aspect of the right upper quadrant in the paracolic gutter. Follow-up ERCP on 05/17/2020 was unsuccessful as the scope could not be advanced beyond D1/D2 duodenal sweep due to edema around the pancreas and therefore NG tube was placed.  Repeat CT scan of the abdomen pelvis done on 05/17/2020 revealed peripancreatic fluid stranding with percutaneous drain in the gallbladder fossa and unchanged biliary stent with no ductal dilation; bilateral pleural effusions were noted.  An IR cholangiogram done on 05/18/2020 revealed well-positioned normally functioning right upper quadrant drainage catheter; this fistulous communication between the drainage catheter and the cystic duct appears to have nearly resolved and therefore next change or upsize of the catheter was not felt to be necessary as per the radiologist and 1.4 L  of bloody ascitic fluid were removed by ultrasound guided paracentesis. A percutaneous biliary drain was planned if the symptoms were to deteriorate or if his leukocytosis worsened. Patient tells me he has not had a bowel movement since 04/25/2020  Objective: Vital signs in last 24 hours: Temp:  [97.6 F (36.4 C)-98.4 F (36.9 C)] 98.4 F (36.9 C) (09/12 0439) Pulse Rate:  [94-98] 94 (09/12 0439) Resp:  [16] 16 (09/12 0439) BP: (115-139)/(69-82) 137/82 (09/12 0439) SpO2:  [91 %] 91 % (09/12 0439) Last BM Date: 05/17/20  Intake/Output from previous day: 09/11 0701 - 09/12 0700 In: 370 [P.O.:370] Out: 675 [Urine:125; Emesis/NG output:300; Drains:250] Intake/Output this shift: Total I/O In: -  Out: 1300 [Emesis/NG output:1000; Drains:300]  General appearance: alert, cooperative, appears stated age, fatigued, moderate distress and pale; NG suction in place Resp: clear to auscultation bilaterally Cardio: regular rate and rhythm, S1, S2 normal, no murmur, click, rub or gallop GI: soft, distended with mild diffuse tenderness on palpation with hypoactive bowel sounds non-tender; bowel sounds; no masses,  no organomegaly Extremities: extremities normal, atraumatic, no cyanosis or edema  Lab Results: Recent Labs    05/17/20 1812 05/18/20 0513 05/19/20 1214  WBC 26.9* 31.3* 22.4*  HGB 12.2* 11.5* 11.8*  HCT 38.9* 35.6* 37.6*  PLT 335 332 306   BMET Recent Labs    05/18/20 0513 05/19/20 1214 05/20/20 0314  NA 135 132* 131*  K 4.6 3.8 3.7  CL 98 97* 97*  CO2 25 23 26   GLUCOSE 152* 131* 135*  BUN 15 15 13   CREATININE 0.90 0.84 0.70  CALCIUM 8.3* 8.4* 8.1*   LFT Recent Labs    05/19/20 1214  PROT 5.5*  ALBUMIN 1.9*  AST 24  ALT 29  ALKPHOS 500*  BILITOT 1.1   Studies/Results: IR CHOLANGIOGRAM EXISTING TUBE  Result Date: 05/19/2020 INDICATION: 64 year old male with a complex history of acute cholecystitis status post biliary leak following laparoscopic  cholecystectomy status post placement of a biliary drainage catheter complicated by severe pancreatitis. Patient currently has a percutaneous drainage catheter in the gallbladder space and has also developed new mild ascites. His gallbladder fossa drain output had been fairly copious in there is prior imaging documentation of a fistulous communication with the cystic duct remanent. His drain output has suddenly slowed. He presents for drain injection with possible exchange/upsize if needed as well as ultrasound-guided paracentesis for diagnostic purposes. EXAM: 1. Drain injection 2. Ultrasound-guided paracentesis MEDICATIONS: The patient is currently admitted to the hospital and receiving intravenous antibiotics. The antibiotics were administered within an appropriate time frame prior to the initiation of the procedure. ANESTHESIA/SEDATION: None. COMPLICATIONS: None immediate. PROCEDURE: Informed written consent was obtained from the patient after a thorough discussion of the procedural risks, benefits and alternatives. All questions were addressed. Maximal Sterile Barrier Technique was utilized including caps, mask, sterile gowns, sterile gloves, sterile drape, hand hygiene and skin antiseptic. A timeout was performed prior to the initiation of the procedure. Hand injection of contrast material was performed through the existing right upper quadrant drainage catheter. The drainage catheter is patent. The contrast material fills a collapsed space adjacent to the drainage catheter. There is partial filling of the cystic duct, but no contrast material enters the common bile duct. Attention was turned to the right lower quadrant. The abdomen was interrogated with ultrasound. There is small volume ascites. A suitable skin entry site was selected and marked. Local anesthesia was attained by infiltration with 1% lidocaine. A small dermatotomy was made. A 6 French Safe-T-Centesis catheter was then advanced through the  abdominal wall into the ascites. Aspiration was performed yielding 1.4 L of bloody ascites. IMPRESSION: 1. Well-positioned and normally functioning right upper quadrant drainage catheter. The fistulous communication between the drainage catheter and the cystic duct appears to be nearly resolved. This likely explains the decreased output. No exchange or upsize necessary. 2. Successful ultrasound-guided diagnostic paracentesis yielding 1.4 L of bloody ascitic fluid. Samples were sent as requested. Electronically Signed   By: Jacqulynn Cadet M.D.   On: 05/19/2020 12:41   [place] EKG SITE RITE  Result Date: 05/18/2020 If Site Rite image not attached, placement could not be confirmed due to current cardiac rhythm.  IR Paracentesis  Result Date: 05/19/2020 INDICATION: 64 year old male with a complex history of acute cholecystitis status post biliary leak following laparoscopic cholecystectomy status post placement of a biliary drainage catheter complicated by severe pancreatitis. Patient currently has a percutaneous drainage catheter in the gallbladder space and has also developed new mild ascites. His gallbladder fossa drain output had been fairly copious in there is prior imaging documentation of a fistulous communication with the cystic duct remanent. His drain output has suddenly slowed. He presents for drain injection with possible exchange/upsize if needed as well as ultrasound-guided paracentesis for diagnostic purposes. EXAM: 1. Drain injection 2. Ultrasound-guided paracentesis MEDICATIONS: The patient is currently admitted to the hospital and receiving intravenous antibiotics. The antibiotics were administered within an appropriate time frame prior to the initiation of the procedure. ANESTHESIA/SEDATION: None. COMPLICATIONS: None immediate. PROCEDURE: Informed written consent was obtained from the patient after a thorough discussion of the procedural risks, benefits and alternatives. All questions were  addressed. Maximal Sterile Barrier Technique was utilized including caps, mask, sterile  gowns, sterile gloves, sterile drape, hand hygiene and skin antiseptic. A timeout was performed prior to the initiation of the procedure. Hand injection of contrast material was performed through the existing right upper quadrant drainage catheter. The drainage catheter is patent. The contrast material fills a collapsed space adjacent to the drainage catheter. There is partial filling of the cystic duct, but no contrast material enters the common bile duct. Attention was turned to the right lower quadrant. The abdomen was interrogated with ultrasound. There is small volume ascites. A suitable skin entry site was selected and marked. Local anesthesia was attained by infiltration with 1% lidocaine. A small dermatotomy was made. A 6 French Safe-T-Centesis catheter was then advanced through the abdominal wall into the ascites. Aspiration was performed yielding 1.4 L of bloody ascites. IMPRESSION: 1. Well-positioned and normally functioning right upper quadrant drainage catheter. The fistulous communication between the drainage catheter and the cystic duct appears to be nearly resolved. This likely explains the decreased output. No exchange or upsize necessary. 2. Successful ultrasound-guided diagnostic paracentesis yielding 1.4 L of bloody ascitic fluid. Samples were sent as requested. Electronically Signed   By: Jacqulynn Cadet M.D.   On: 05/19/2020 12:41   Medications: I have reviewed the patient's current medications.  Assessment/Plan: 1) Acute on chronic cholecystitis with necrosis resulting in laparoscopic cholecystectomy on 2/95/6213 complicated by post ERCP acute pancreatitis complicated by postoperative biliary leak. Patient's leukocytosis seems to be improving. CBC for today is still pending.  Continue broad-spectrum antibiotics.  On Meropenem at the present time.  Biliary drainage seems to have increased up to 300 cc  over last 24 hours. 2) Ileus [worsened by narcotic induced constipation] pancreatic ascites complicating acute pancreatitis-NG decompression seems to be helping; minimize use of pain medications. 3) Protein calorie malnutrition/feeding difficulties. 4) AKI with hypokalemia-seems to be improving. 5) Acute metabolic encephalopathy-improving slowly. 6) Depression and anxiety on Sertraline and Trazodone.   LOS: 23 days   Juanita Craver 05/20/2020, 12:50 PM

## 2020-05-21 ENCOUNTER — Inpatient Hospital Stay (HOSPITAL_COMMUNITY): Payer: 59

## 2020-05-21 ENCOUNTER — Telehealth: Payer: Self-pay | Admitting: Gastroenterology

## 2020-05-21 LAB — CBC
HCT: 33.3 % — ABNORMAL LOW (ref 39.0–52.0)
Hemoglobin: 10.3 g/dL — ABNORMAL LOW (ref 13.0–17.0)
MCH: 27.5 pg (ref 26.0–34.0)
MCHC: 30.9 g/dL (ref 30.0–36.0)
MCV: 89 fL (ref 80.0–100.0)
Platelets: 229 10*3/uL (ref 150–400)
RBC: 3.74 MIL/uL — ABNORMAL LOW (ref 4.22–5.81)
RDW: 14.4 % (ref 11.5–15.5)
WBC: 22.5 10*3/uL — ABNORMAL HIGH (ref 4.0–10.5)
nRBC: 0 % (ref 0.0–0.2)

## 2020-05-21 LAB — COMPREHENSIVE METABOLIC PANEL
ALT: 18 U/L (ref 0–44)
AST: 17 U/L (ref 15–41)
Albumin: 1.6 g/dL — ABNORMAL LOW (ref 3.5–5.0)
Alkaline Phosphatase: 300 U/L — ABNORMAL HIGH (ref 38–126)
Anion gap: 9 (ref 5–15)
BUN: 15 mg/dL (ref 8–23)
CO2: 24 mmol/L (ref 22–32)
Calcium: 8.4 mg/dL — ABNORMAL LOW (ref 8.9–10.3)
Chloride: 99 mmol/L (ref 98–111)
Creatinine, Ser: 0.68 mg/dL (ref 0.61–1.24)
GFR calc Af Amer: >60 mL/min (ref >60)
GFR calc non Af Amer: >60 mL/min (ref >60)
Glucose, Bld: 182 mg/dL — ABNORMAL HIGH (ref 70–99)
Potassium: 3.6 mmol/L (ref 3.5–5.1)
Sodium: 132 mmol/L — ABNORMAL LOW (ref 135–145)
Total Bilirubin: 0.7 mg/dL (ref 0.3–1.2)
Total Protein: 5.2 g/dL — ABNORMAL LOW (ref 6.5–8.1)

## 2020-05-21 LAB — DIFFERENTIAL
Abs Immature Granulocytes: 0.59 10*3/uL — ABNORMAL HIGH (ref 0.00–0.07)
Basophils Absolute: 0.1 10*3/uL (ref 0.0–0.1)
Basophils Relative: 0 %
Eosinophils Absolute: 0.3 10*3/uL (ref 0.0–0.5)
Eosinophils Relative: 1 %
Immature Granulocytes: 3 %
Lymphocytes Relative: 5 %
Lymphs Abs: 1.2 10*3/uL (ref 0.7–4.0)
Monocytes Absolute: 1.1 10*3/uL — ABNORMAL HIGH (ref 0.1–1.0)
Monocytes Relative: 5 %
Neutro Abs: 19.3 10*3/uL — ABNORMAL HIGH (ref 1.7–7.7)
Neutrophils Relative %: 86 %

## 2020-05-21 LAB — GLUCOSE, CAPILLARY
Glucose-Capillary: 122 mg/dL — ABNORMAL HIGH (ref 70–99)
Glucose-Capillary: 131 mg/dL — ABNORMAL HIGH (ref 70–99)
Glucose-Capillary: 148 mg/dL — ABNORMAL HIGH (ref 70–99)
Glucose-Capillary: 153 mg/dL — ABNORMAL HIGH (ref 70–99)
Glucose-Capillary: 168 mg/dL — ABNORMAL HIGH (ref 70–99)
Glucose-Capillary: 169 mg/dL — ABNORMAL HIGH (ref 70–99)

## 2020-05-21 LAB — MAGNESIUM: Magnesium: 1.9 mg/dL (ref 1.7–2.4)

## 2020-05-21 LAB — PHOSPHORUS: Phosphorus: 3 mg/dL (ref 2.5–4.6)

## 2020-05-21 LAB — PREALBUMIN: Prealbumin: 7.2 mg/dL — ABNORMAL LOW (ref 18–38)

## 2020-05-21 LAB — TRIGLYCERIDES: Triglycerides: 146 mg/dL (ref <150)

## 2020-05-21 MED ORDER — TRAVASOL 10 % IV SOLN
INTRAVENOUS | Status: AC
  Filled 2020-05-21: qty 1320

## 2020-05-21 NOTE — Progress Notes (Signed)
[facility] Progress Note  4 Days Post-Op  Subjective: Patient is just frustrated. He wants to be able to go home. He feels like he keeps getting forgotten about. He reports abdomen feels swollen again and he is needing dilaudid about q3h. He is passing flatus and ambulating but has not had a BM. He feels like he is getting SOB walking around and wakes up gasping sometimes but does not feel like it is secondary to NGT. He is not SOB at rest.   Objective: Vital signs in last 24 hours: Temp:  [97.7 F (36.5 C)-98.4 F (36.9 C)] 98.4 F (36.9 C) (09/13 0817) Pulse Rate:  [84-95] 95 (09/13 0817) Resp:  [17] 17 (09/13 0817) BP: (143-158)/(83-93) 147/91 (09/13 0817) SpO2:  [89 %-94 %] 94 % (09/13 0817) Last BM Date: 05/17/20  Intake/Output from previous day: 09/12 0701 - 09/13 0700 In: 1617.5 [I.V.:1617.5] Out: 2975 [Urine:175; Emesis/NG output:1900; Drains:900] Intake/Output this shift: No intake/output data recorded.  PE: General: pleasant, WD, ill appearing male who is laying in bed in NAD Heart: regular, rate, and rhythm.  Palpable radial and pedal pulses bilaterally Lungs: CTAB, no wheezes, rhonchi, or rales noted.  Respiratory effort nonlabored Abd: soft, NT, distended, NGT with clear yellow drainage, IR drain with large amount bilious drainage   Lab Results:  Recent Labs    05/20/20 1834 05/21/20 0418  WBC 26.4* 22.5*  HGB 10.8* 10.3*  HCT 34.3* 33.3*  PLT 233 229   BMET Recent Labs    05/20/20 0314 05/21/20 0418  NA 131* 132*  K 3.7 3.6  CL 97* 99  CO2 26 24  GLUCOSE 135* 182*  BUN 13 15  CREATININE 0.70 0.68  CALCIUM 8.1* 8.4*   PT/INR No results for input(s): LABPROT, INR in the last 72 hours. CMP     Component Value Date/Time   NA 132 (L) 05/21/2020 0418   K 3.6 05/21/2020 0418   CL 99 05/21/2020 0418   CO2 24 05/21/2020 0418   GLUCOSE 182 (H) 05/21/2020 0418   BUN 15 05/21/2020 0418   CREATININE 0.68 05/21/2020 0418   CALCIUM 8.4  (L) 05/21/2020 0418   PROT 5.2 (L) 05/21/2020 0418   ALBUMIN 1.6 (L) 05/21/2020 0418   AST 17 05/21/2020 0418   ALT 18 05/21/2020 0418   ALKPHOS 300 (H) 05/21/2020 0418   BILITOT 0.7 05/21/2020 0418   GFRNONAA >60 05/21/2020 0418   GFRAA >60 05/21/2020 0418   Lipase     Component Value Date/Time   LIPASE 27 05/16/2020 1905       Studies/Results: No results found.  Anti-infectives: Anti-infectives (From admission, onward)   Start     Dose/Rate Route Frequency Ordered Stop   05/04/20 1230  meropenem (MERREM) 1 g in sodium chloride 0.9 % 100 mL IVPB  Status:  Discontinued        1 g 200 mL/hr over 30 Minutes Intravenous Every 8 hours 05/04/20 1156 05/19/20 0826   05/01/20 1400  piperacillin-tazobactam (ZOSYN) IVPB 3.375 g        3.375 g 12.5 mL/hr over 240 Minutes Intravenous Every 8 hours 05/01/20 1013 05/03/20 0907   04/29/20 2000  vancomycin (VANCOCIN) IVPB 1000 mg/200 mL premix  Status:  Discontinued        1,000 mg 200 mL/hr over 60 Minutes Intravenous Every 12 hours 04/29/20 0653 04/30/20 0831   04/29/20 0745  vancomycin (VANCOREADY) IVPB 1500 mg/300 mL        1,500 mg 150 mL/hr  over 120 Minutes Intravenous  Once 04/29/20 0653 04/29/20 1129   04/26/20 2300  piperacillin-tazobactam (ZOSYN) IVPB 3.375 g        3.375 g 12.5 mL/hr over 240 Minutes Intravenous Every 8 hours 04/26/20 1716 05/01/20 0559   04/26/20 1700  piperacillin-tazobactam (ZOSYN) IVPB 3.375 g        3.375 g 100 mL/hr over 30 Minutes Intravenous  Once 04/26/20 1655 04/26/20 1844       Assessment/Plan Hx subtotal cholecystectomy - Dr. Bobbye Morton - 01/24/2020 Bile leak with ERCP and post ERCP pancreatitis -HIDA showed persistent bile leak as expected - appreciate GI input, ERCP unsuccessful 9/9 secondary to severe gastritis and duodenitis with GOO - NGT placed - CT 9/9 shows pancreatitis with some organizing pancreatic collections, unchanged position of biliary stent, moderate ascites, improved colonic  wall thickening  - IR cholangiogram 9/10 - well-positioned normally functioning right upper quadrant drainage catheter; this fistulous communication between the drainage catheter and the cystic duct appears to have nearly resolved and therefore next change or upsize of the catheter was not felt to be necessary  - Para 9/10 with 1.4 L of fluid drawn off - amylase normal, lipase and Tbili analyses of fluid still pending this AM - patient on TPN, passing flatus but no BM, 1900 cc recorded out yesterday - no acute surgical plans. Will monitor  FEN -NGT to LIWS, CLD VTE -Lovenox ID -Merrem  LOS: 24 days    Norm Parcel , [facility] 05/21/2020, 10:40 AM Please see Amion for pager number during day hours 7:00am-4:30pm

## 2020-05-21 NOTE — Progress Notes (Signed)
Daily Rounding Note  05/21/2020, 2:06 PM  LOS: 24 days   SUBJECTIVE:   Chief complaint:  Post op bile leak for many weeks, sp ERCP and stents, post ercp pancreatitis   abd pain persists, controlled w meds.  No n/v. Passing flatus Remains on TNA.  Gets SOB w walking any distance.   Bile drain output: 900 mL yest.   NGT output: 1.9 liters yest.     OBJECTIVE:         Vital signs in last 24 hours:    Temp:  [97.7 F (36.5 C)-98.4 F (36.9 C)] 98.4 F (36.9 C) (09/13 0817) Pulse Rate:  [84-95] 95 (09/13 0817) Resp:  [17] 17 (09/13 0817) BP: (143-158)/(83-93) 147/91 (09/13 0817) SpO2:  [89 %-94 %] 94 % (09/13 0817) Last BM Date: 05/17/20 Filed Weights   05/08/20 0357 05/13/20 0306 05/18/20 0500  Weight: 86.9 kg 86 kg 84.6 kg   General: frail, ill/tired appearing   Heart: RRR Chest: clear bil.   No cough or dyspnea Abdomen: soft, mild diffuse tenderness.  BS active  Extremities: no CCE Neuro/Psych:  Alert, oriented x 3.  Weak but able to stand on his own.    Intake/Output from previous day: 09/12 0701 - 09/13 0700 In: 1617.5 [I.V.:1617.5] Out: 2975 [Urine:175; Emesis/NG output:1900; Drains:900]  Intake/Output this shift: No intake/output data recorded.  Lab Results: Recent Labs    05/19/20 1214 05/20/20 1834 05/21/20 0418  WBC 22.4* 26.4* 22.5*  HGB 11.8* 10.8* 10.3*  HCT 37.6* 34.3* 33.3*  PLT 306 233 229   BMET Recent Labs    05/19/20 1214 05/20/20 0314 05/21/20 0418  NA 132* 131* 132*  K 3.8 3.7 3.6  CL 97* 97* 99  CO2 23 26 24   GLUCOSE 131* 135* 182*  BUN 15 13 15   CREATININE 0.84 0.70 0.68  CALCIUM 8.4* 8.1* 8.4*   LFT Recent Labs    05/19/20 1214 05/21/20 0418  PROT 5.5* 5.2*  ALBUMIN 1.9* 1.6*  AST 24 17  ALT 29 18  ALKPHOS 500* 300*  BILITOT 1.1 0.7   PT/INR No results for input(s): LABPROT, INR in the last 72 hours. Hepatitis Panel No results for input(s): HEPBSAG,  HCVAB, HEPAIGM, HEPBIGM in the last 72 hours.  Studies/Results: No results found.   Scheduled Meds: . acetaminophen  650 mg Oral Q6H  . acidophilus  2 capsule Oral Daily  . calcium carbonate  1 tablet Oral TID WC  . Chlorhexidine Gluconate Cloth  6 each Topical Daily  . enoxaparin (LOVENOX) injection  40 mg Subcutaneous Q24H  . insulin aspart  0-15 Units Subcutaneous Q4H  . mouth rinse  15 mL Mouth Rinse BID  . metoCLOPramide (REGLAN) injection  5 mg Intravenous TID AC  . oxyCODONE  10 mg Oral Q12H  . pantoprazole (PROTONIX) IV  40 mg Intravenous Q12H  . polyethylene glycol  17 g Oral Daily  . pravastatin  20 mg Oral q1800  . sertraline  100 mg Oral Daily  . sodium chloride flush  10-40 mL Intracatheter Q12H  . traZODone  50 mg Oral QHS   Continuous Infusions: . sodium chloride Stopped (05/07/20 0923)  . TPN ADULT (ION) 65 mL/hr at 05/20/20 1754  . TPN ADULT (ION)     PRN Meds:.HYDROmorphone (DILAUDID) injection, ipratropium-albuterol, lidocaine, metoprolol tartrate, oxyCODONE, polyvinyl alcohol, promethazine, sodium chloride flush, sodium chloride flush   ASSESMENT:   *    Postop bile leak.  Has  had ERCP with stent placement on 2 separate occasions but bile leak persists.  05/17/2020 attempted ERCP unsuccessful due to severe gastritis, duodenitis, GOO.   Latest CT of 9/9 shows pancreatitis, organizing fluid collections, stable position of biliary stent, moderate ascites, improvement in colonic wall thickening. Cholangiogram 9/10 shows good positioning of the drainage catheter in right upper quadrant.  Previous fistulous communication between the catheter and cystic ducts nearly resolved.  No adjustments were made to the drainage catheter. 05/18/2020 1.4 L paracentesis.  Fluid amylase normal.  Fluid lipase and T bili still pending.  *     GOO due to severe pancreatitis and mass-effect on the stomach/duodenum.  Back on TNA as of late last week.   PLAN   *   Stop tid reglan in  setting of GOO.    *   Supportive care.      Azucena Freed  05/21/2020, 2:06 PM Phone [phone]

## 2020-05-21 NOTE — Progress Notes (Signed)
PROGRESS NOTE    Adam Fowler  CXK:[id number] DOB: 19-Jun-1956 DOA: 04/26/2020 PCP: Melony Overly, MD    Brief Narrative:  Patient admitted to the hospital working diagnosis of post ERCP acute pancreatitis.Prolonged hospitalization with multiple complications, including respiratory failure (required mechanical ventilation), AKI, encephalopathy and atrial fibrillation.  64 year old male with past medical history significant for hypertension. Heunderwent ERCP on 8/18 and biliary stent exchange for persistent postoperative bile leak,related to recent cholecystectomy with abscess status post percutaneous drainage. Patient developed severe epigastric abdominal pain, associated with nausea, and vomiting after ERCP procedure. On his initial physical examination blood pressure 137/88, heart rate 114, respiratoryrate23, oxygen saturation 92%,his lungs were clear to auscultation bilaterally, heart S1-S2, present, tachycardic, his abdomen was tender to palpation,itwas distended, no rebound or guarding,no lower extremity edema.  CT of the abdomen pelvis show changes consistent with acute pancreatitis, no abscess or drainable fluid collection. Patient was admitted to the medical ward, he received supportive medical therapy including intravenous fluids, analgesics and IV antibiotics.   His hospitalization has been complicated by metabolic encephalopathy,acute hypoxicrespiratory failure, renal failure, new onset paroxysmal atrial fibrillation and acute ileus.  08/21-08/22 rapid response due to increase work of breathing and atrial fibrillation, required invasive mechanical ventilation. He was treated for volume overload and aspiration pneumonia. Received diuresis and antibiotic therapy with Zosyn.   Liberated from mechanical ventilation on 08/24 and transitioned to nasal cannula with good toleration.   Patient continue to have leukocytosis and abdominal pain, repeat CT of the abdomen  pelvis(8/25)markedly diffuse pancreatic inflammation with fat stranding and free fluid.  His antibiotic therapy was switched from Zosyn to meropenem on 8/27. Hewasplaced on tube feeds via NG tubefor several days.  On 09/03 Ng tube was removed, patient tolerating nutritional support with by mouth intake.  Slowly patient improving, but continue to have high output biliary drain, despite stent in appropriate location and stable liver enzymes. 09/05.CT abdomen with sings of sequela of severe pancreatitis. Stable positioning of internal biliary stent with no intrahepatic biliary duct dilatation.  HIDA scanfrom 09/07 with persistent bile leak accumulating in the lateral aspect of the right upper quadrant, paracolic gutter.  Patient with very poor oral intake.  09/09 unsuccessful ERCP scope not able to pass beyond D1/D2 sweep.Patient had significant gastric fluid retention, related to functional outlet obstruction. NG tubeinserted andconnected to intermittent suction.  CT abdomen and pelvis 09/09 with pancreatitis (peripancreatic fat stranding and peripancreatic fluid), percutaneous drain at the gallbladder fossa, unchanged biliary stent with no intrahepatic biliary duct dilatation, bilateral pleural effusions.   09/10 Ordered PICC line to be placed to start TPN.  Because a decreased biliary drain output and positive ascites, IR has been re-consulted for percutaneous drain replacement of upsize, plus diagnostic paracentesis. Drain tube well-positioned and normally functioning, the fistulous communication between the drainage catheter and the cystic duct appears to be nearly resolved.  1.4 L of bloody ascitic fluids removed.    Assessment & Plan:   Principal Problem:   Post-ERCP acute pancreatitis Active Problems:   Severe sepsis (HCC)   Polycythemia   AKI (acute kidney injury) ([place])   Metabolic acidosis   Acute respiratory failure with hypoxia (HCC)   Bilateral  pleural effusion   Ileus (HCC)   Postoperative bile leak    1. Acute post ERCP pancreatitis/ ileus.Persistent biliaryleak per HIDA scan.The tube drainage has been revised and working properly/ unsuccessfully repeat ERCP (09/09). Drain output is 900 cc over last 24 H. Persistent leukocytosis 22.5. NG tube in place,  at low intermittent suction 1,900 cc over last 24 H. Patient tolerating TPN well, continue pain control with oxycodone and hydromorphone. Continue to hold on antibiotic therapy for now.   Pending GI recommendations.    2. Acute hypoxic respiratory failure/bilateral pleural effusions more left than right, likely transudate. Today with worsening dyspnea, oxygenation 94 on room air, but positive desaturation on ambulation. Patient complains of orthopnea.   Continue airway clearing techniques with incentive spirometer, continue oxymetry monitoring and supplemental 02 per [place]. Check chest film today.   3. AKIwith hypokalemia.Renal function with serum cr at 0,68, K at 3,6 and Mg at 1,9, bicarbonate at 24. Continue electrolyte correction per TPN per pharmacy protocol.  Continue close follow up of renal function and electrolytes, patient with significant GI losses per NG tube and biliary drain.   4. Paroxysmal atrial fibrillation/ transitory.Converted to simus rhythm. Has a low risk of thrombosis CHADS vasc score of 1. Will hold on antithrombotic therapy for now. Now off telemetry for now.  6. Anemiaof chronic disease.Today Hgb at 10,3 and Hct at 33.3  7. Acute metabolic encephalopathy/ depression and anxiety.On sertralineand as needed trazodone at night for sleep.This am with no confusion or agitation, encephalopathy has resolved.   8. HTN/ dyslipidemia.( [place] on lisinopril and hctz combination).On withpravastatin. Continue to hold on antihypertensive medications to prevent hypotension.   11. Transitory hyperglycemia. Hgb A1c 5.6.fasting glucose this  am is 182, continue nutritional support with TPN.  Patient allowed to have clear liquids, but persistent high output NG tube.   12. Moderate calorie protein malnutrition.On TPN per pharmacy protocol with good toleration.    13. Depression and anxiety.On sertraline and trazodone   Patient continue to be at high risk for worsening biliary leak and volume loss.   Status is: Inpatient  Remains inpatient appropriate because:IV treatments appropriate due to intensity of illness or inability to take PO   Dispo:  Patient From: Home  Planned Disposition: Home with Health Care Svc  Expected discharge date: 05/24/20  Medically stable for discharge: No    DVT prophylaxis: Enoxaparin   Code Status:   full  Family Communication:  No family at the bedside      Consultants:   GI   Surgery   IR   Procedures:   ERCP  Paracentesis   Antimicrobials:   Meropenem completed.     Subjective: Patient having significant dyspnea, difficult to make a deep breath, no nausea or vomiting, positive lower extremity edema and orthopnea.   Objective: Vitals:   05/20/20 1507 05/20/20 2141 05/21/20 0410 05/21/20 0817  BP: (!) 144/93 (!) 143/83 (!) 158/88 (!) 147/91  Pulse: 91 87 84 95  Resp: 17 17  17   Temp: 97.7 F (36.5 C) 97.7 F (36.5 C) 97.9 F (36.6 C) 98.4 F (36.9 C)  TempSrc: Oral Oral Oral Oral  SpO2: (!) 89% 91% 91% 94%  Weight:      Height:        Intake/Output Summary (Last 24 hours) at 05/21/2020 1206 Last data filed at 05/21/2020 0500 Gross per 24 hour  Intake 1617.53 ml  Output 1675 ml  Net -57.47 ml   Filed Weights   05/08/20 0357 05/13/20 0306 05/18/20 0500  Weight: 86.9 kg 86 kg 84.6 kg    Examination:   General: Not in pain or dyspnea, deconditioned  Neurology: Awake and alert, non focal  E ENT: mild pallor, no icterus, oral mucosa moist. NG tube in place.  Cardiovascular: No JVD. S1-S2 present, rhythmic,  no gallops, rubs, or murmurs. ++ pitting  lower extremity edema. Pulmonary: positive breath sounds bilaterally, decreased air movement at bases, no wheezing, rhonchi or rales. Gastrointestinal. Abdomen mild distended but not tender to superficial palpation. Biliary drain in place.  Skin. No rashes Musculoskeletal: no joint deformities     Data Reviewed: I have personally reviewed following labs and imaging studies  CBC: Recent Labs  Lab 05/16/20 1905 05/16/20 1905 05/17/20 1812 05/18/20 0513 05/19/20 1214 05/20/20 1834 05/21/20 0418  WBC 16.0*   < > 26.9* 31.3* 22.4* 26.4* 22.5*  NEUTROABS 13.9*  --  26.4* 29.5* 19.8*  --  19.3*  HGB 12.0*   < > 12.2* 11.5* 11.8* 10.8* 10.3*  HCT 38.3*   < > 38.9* 35.6* 37.6* 34.3* 33.3*  MCV 88.0   < > 87.6 87.5 88.7 86.8 89.0  PLT 360   < > 335 332 306 233 229   < > = values in this interval not displayed.   Basic Metabolic Panel: Recent Labs  Lab 05/17/20 1812 05/18/20 0513 05/19/20 1214 05/20/20 0314 05/21/20 0418  NA 135 135 132* 131* 132*  K 4.0 4.6 3.8 3.7 3.6  CL 99 98 97* 97* 99  CO2 25 25 23 26 24   GLUCOSE 163* 152* 131* 135* 182*  BUN 16 15 15 13 15   CREATININE 1.01 0.90 0.84 0.70 0.68  CALCIUM 8.8* 8.3* 8.4* 8.1* 8.4*  MG  --   --  1.8 2.0 1.9  PHOS  --   --  3.4 2.4* 3.0   GFR: Estimated Creatinine Clearance: 93.3 mL/min (by C-G formula based on SCr of 0.68 mg/dL). Liver Function Tests: Recent Labs  Lab 05/16/20 1905 05/17/20 1812 05/18/20 0513 05/19/20 1214 05/21/20 0418  AST 37 88* 51* 24 17  ALT 24 60* 45* 29 18  ALKPHOS 424* 1,011* 753* 500* 300*  BILITOT 1.5* 2.2* 1.3* 1.1 0.7  PROT 5.6* 5.6* 5.2* 5.5* 5.2*  ALBUMIN 2.0* 2.0* 1.9* 1.9* 1.6*   Recent Labs  Lab 05/16/20 1905  LIPASE 27   No results for input(s): AMMONIA in the last 168 hours. Coagulation Profile: Recent Labs  Lab 05/16/20 1905  INR 1.3*   Cardiac Enzymes: No results for input(s): CKTOTAL, CKMB, CKMBINDEX, TROPONINI in the last 168 hours. BNP (last 3 results) No  results for input(s): PROBNP in the last 8760 hours. HbA1C: No results for input(s): HGBA1C in the last 72 hours. CBG: Recent Labs  Lab 05/20/20 2027 05/21/20 0003 05/21/20 0404 05/21/20 0914 05/21/20 1128  GLUCAP 153* 122* 168* 153* 169*   Lipid Profile: Recent Labs    05/19/20 1214 05/21/20 0419  TRIG 145 146   Thyroid Function Tests: No results for input(s): TSH, T4TOTAL, FREET4, T3FREE, THYROIDAB in the last 72 hours. Anemia Panel: No results for input(s): VITAMINB12, FOLATE, FERRITIN, TIBC, IRON, RETICCTPCT in the last 72 hours.    Radiology Studies: I have reviewed all of the imaging during this hospital visit personally     Scheduled Meds: . acetaminophen  650 mg Oral Q6H  . acidophilus  2 capsule Oral Daily  . calcium carbonate  1 tablet Oral TID WC  . Chlorhexidine Gluconate Cloth  6 each Topical Daily  . enoxaparin (LOVENOX) injection  40 mg Subcutaneous Q24H  . insulin aspart  0-15 Units Subcutaneous Q4H  . mouth rinse  15 mL Mouth Rinse BID  . metoCLOPramide (REGLAN) injection  5 mg Intravenous TID AC  . oxyCODONE  10 mg Oral Q12H  .  pantoprazole (PROTONIX) IV  40 mg Intravenous Q12H  . polyethylene glycol  17 g Oral Daily  . pravastatin  20 mg Oral q1800  . sertraline  100 mg Oral Daily  . sodium chloride flush  10-40 mL Intracatheter Q12H  . traZODone  50 mg Oral QHS   Continuous Infusions: . sodium chloride Stopped (05/07/20 0923)  . TPN ADULT (ION) 65 mL/hr at 05/20/20 1754  . TPN ADULT (ION)       LOS: 24 days        Markise Haymer Gerome Apley, MD

## 2020-05-21 NOTE — Telephone Encounter (Signed)
Patient called states is in the hospital and someone seems to know what they are doing at the hospital states he has been going through this for a month and is seeking some answers in reference to his gallbladder testings. States the white blood counts are too high but has not received any other information.

## 2020-05-21 NOTE — Telephone Encounter (Signed)
The pt has been advised to tell the floor nurse what his concerns are and they will address with him.  The pt has been advised of the information and verbalized understanding.

## 2020-05-21 NOTE — Progress Notes (Signed)
PHARMACY - TOTAL PARENTERAL NUTRITION CONSULT NOTE   Indication: severe pancreatitis  Patient Measurements: Height: _0  (175.3 cm) Weight: 84.6 kg (186 lb 8.2 oz) IBW/kg (Calculated) : 70.7 TPN AdjBW (KG): 86.1 Body mass index is 27.54 kg/m. Usual Weight:    Assessment: Admitted 04/26/20. Post-ERCP 8/18 and biliary stent exchange for persistent postoperative bile leak related to recent cholecystectomy with abscess status post percutaneous drainage. Developed acute pancreatitis.  - Prolonged hospitalization complicated by VDRF (extubated 8/24), AKI, encephalopathy, and afib. - 9/11: Still with very poor oral intake. Start TPN for Acute post ERCP pancreatitis/ ileus and persistent biliary leak. Significant gastric fluid retention from functional outlet obstruction. NG to suction   Glucose / Insulin: A1C 5.6. CBGs <180. 14 units SSI used in 24 hours Electrolytes: Na 132 low. AdjCa 10.3 ( on Tums TID) Renal: Scr <1.  LFTs / TGs: AST/ALT wnl. Alk phos 753>>300; TG 146 Prealbumin / albumin: Prealbumin 7.2. Albumin 1.6 Intake / Output; MIVF:  LBM 05/17/20. NG 1.9L up (to LIWS). Drains 900 out/24hr. I/O -1357.  GI Imaging: 9/5: CT with signs of sequela of severe pancreatitis 9/7: HIDA with persistent bile leak with accumulations and paracolic gutter. 9/9: CT: pancreatitis (peripancreatic fat stranding and peripancreatic fluid, percutaneous drain at the gallbladder fossa, unchanged biliary stent with no intrahepatic biliary duct dilatation, bilateral pleural effusions.   Surgeries / Procedures:  8/18 ERCP and biliary stent exchange 9/9: Unsuccessful ERCP 9/10: Paracentesis 1.4L out   GI Meds: Acidophilus 2 daily, Reglan 75m IV TID, IV PPI/12h, Miralax daily  Central access: PICC placed 9/11. TPN start date: 9/11  Nutritional Goals (per RD recommendation on 05/18/20): kCal: 2300-2500, Protein: 130-145, Fluid: >2.3L Goal TPN rate is 100 mL/hr (provides 132 g of protein and 2342 kcals per  day)  Current Nutrition: CLD 9/11 po intake 370 of CLD started in PM>>9/13 eating ~10% per RN   Plan:  Change to unconcentrated TPN and increase to 1085mhr at 18:00 (provides 132g AA, 67g lipids, 336g dextrose, and 2342kcal) Electrolytes in TPN: Increase to 7554mL of Na (Mg will increase with rate); Decrease to 35 mEq/L of K, 2mE61m of Calcium, and 10mm7m of Phos. Cl:Ac ratio 1:1 Add standard MVI and trace elements to TPN Continue Moderate q4h SSI and adjust as needed  Monitor Daily TPN labs while adjusting to goal and cycling Change meds to po (MV, Reglan, PPI) when tolerating diet reliably   LydiaBenetta SparrmD, BCPS, BCCP [facility] Hospital[facility] Pharmacist  Please check AMION for all [facility] numbers After 10:00 PM, call Main [place]

## 2020-05-21 NOTE — Progress Notes (Signed)
Physical Therapy Treatment Patient Details Name: Edwin Baines MRN: [id number] DOB: 04-03-1956 Today's Date: 05/21/2020    History of Present Illness Pt is a 64 yo male who presents to the ED with severe abdominal pain, nausea and vomiting. Found to have post ERCP acute pancreatits. Pt developed Hypoxic resp failure and was Intubated 8/22  and extubated 8/24. Pt also with new onset a fib and acute ileus. PMH includes HTN and ERCP.     PT Comments    Pt supine in bed with wife present.  Wife left shortly after session started.  Focused on gt training and postural awareness with use of RW.  Pt more reliant of device for balance vs. Support.  Pt progressed to stair training.  Pt tolerated session well but SPO2 continues to drop with activity to 86%, with rest breaks and pursed lip breathing it returns to 92%.  Continue to recommend home with HHPT.     Follow Up Recommendations  Home health PT;Supervision for mobility/OOB     Equipment Recommendations  None recommended by PT    Recommendations for Other Services       Precautions / Restrictions Precautions Precautions: Fall Precaution Comments: abdominal drain, NGT Restrictions Weight Bearing Restrictions: No    Mobility  Bed Mobility Overal bed mobility: Needs Assistance Bed Mobility: Supine to Sit     Supine to sit: Supervision     General bed mobility comments: Increased time and effort but moved to edge of bed unsupported and able to donn B socks.  Transfers Overall transfer level: Modified independent Equipment used: Rolling walker (2 wheeled) Transfers: Sit to/from Stand              Ambulation/Gait Ambulation/Gait assistance: Supervision Gait Distance (Feet): 300 Feet (+ 80 ft) Assistive device: Rolling walker (2 wheeled) Gait Pattern/deviations: Step-through pattern;Decreased stride length;Trunk flexed Gait velocity: decreased   General Gait Details: Cues for walker proximity and postural awareness. DOE  3/4.  SPO2 ranged from 86%-92% improved with pursed lip breathing.   Stairs Stairs: Yes Stairs assistance: Supervision Stair Management: Step to pattern;Forwards;Backwards;One rail Left Number of Stairs: 5 General stair comments: Cues for sequencing and safety.  Limited due to fatigue.   Wheelchair Mobility    Modified Rankin (Stroke Patients Only)       Balance Overall balance assessment: Needs assistance Sitting-balance support: Feet supported;No upper extremity supported Sitting balance-Leahy Scale: Good Sitting balance - Comments: no LOB with donning socks   Standing balance support: Bilateral upper extremity supported;During functional activity Standing balance-Leahy Scale: Poor Standing balance comment: increased balance with one UE support               High Level Balance Comments: unable to achieve or maintain tandem stance without significant assist. tandem walk with modA and SPC 10 ft x3 with multiple LOB            Cognition Arousal/Alertness: Awake/alert Behavior During Therapy: WFL for tasks assessed/performed Overall Cognitive Status: Within Functional Limits for tasks assessed                                        Exercises      General Comments        Pertinent Vitals/Pain Pain Assessment: Faces Faces Pain Scale: Hurts a little bit Pain Location: discomfort at NGT site Pain Descriptors / Indicators: Discomfort Pain Intervention(s): Monitored during session;Repositioned    Home  Living                      Prior Function            PT Goals (current goals can now be found in the care plan section) Acute Rehab PT Goals Patient Stated Goal: go home Potential to Achieve Goals: Good Progress towards PT goals: Progressing toward goals    Frequency    Min 3X/week      PT Plan Current plan remains appropriate    Co-evaluation              AM-PAC PT "6 Clicks" Mobility   Outcome Measure  Help  needed turning from your back to your side while in a flat bed without using bedrails?: None Help needed moving from lying on your back to sitting on the side of a flat bed without using bedrails?: None Help needed moving to and from a bed to a chair (including a wheelchair)?: None Help needed standing up from a chair using your arms (e.g., wheelchair or bedside chair)?: None Help needed to walk in hospital room?: A Little Help needed climbing 3-5 steps with a railing? : A Little 6 Click Score: 22    End of Session Equipment Utilized During Treatment: Gait belt Activity Tolerance: Patient tolerated treatment well Patient left: in chair;with call bell/phone within reach;with chair alarm set Nurse Communication: Mobility status PT Visit Diagnosis: Unsteadiness on feet (R26.81);Muscle weakness (generalized) (M62.81);Difficulty in walking, not elsewhere classified (R26.2)     Time: 1224-4975 PT Time Calculation (min) (ACUTE ONLY): 36 min  Charges:  $Gait Training: 23-37 mins                     Erasmo Leventhal , PTA Acute Rehabilitation Services Pager [phone] Office [phone]    Brionne Mertz Eli Hose 05/21/2020, 1:52 PM

## 2020-05-21 NOTE — Progress Notes (Signed)
Nutrition Follow-up  RD working remotely.  DOCUMENTATION CODES:   Not applicable  INTERVENTION:   -TPN management per pharmacy -RD will follow for diet advancement and add supplements as appropriate  NUTRITION DIAGNOSIS:   Inadequate oral intake related to acute illness as evidenced by NPO status.  Ongoing  GOAL:   Patient will meet greater than or equal to 90% of their needs  Progressing   MONITOR:   PO intake, Supplement acceptance, Labs, Weight trends, TF tolerance, I & O's  REASON FOR ASSESSMENT:   Ventilator    ASSESSMENT:   64 year old male with PMH of HTN, ERCP on 8/18 s/p biliary stent exchange for persistent postoperative bile leak after recent cholecystectomy with abscess s/- percutaneous drainage. CT abdomen/pelvis showing acute pancreatitis. Pt admitted with severe sepsis secondary to post-ERCP pancreatitis.  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   Pt remains with minimal oral intake; continues with NGT for decompression. Pt on TPN as of 05/19/20; currently receiving 60 mL/hr at  1800 to provide 1778 kcals and 101 grams protein, meeting 77% of estimated kcal needs and 85% of estimated protein needs.   Wt has been stable since admission.   Medications reviewed and incleud acidophilus, calcium carbonate, reglan, and miralax.   Labs reviewed: Na: 132, CBGS: 122-168 (inpatient orders for glycemic control are 0-15 units insulin aspart every 4 hours).   Diet Order:   Diet Order            Diet clear liquid Room service appropriate? Yes; Fluid consistency: Thin  Diet effective now                 EDUCATION NEEDS:   Education needs have been addressed  Skin:  Skin Assessment: Skin Integrity Issues: Skin Integrity Issues:: Incisions Incisions: closed abdomen  Last BM:  05/17/20  Height:   Ht Readings from Last 1 Encounters:  05/02/20 5\' 9"  (1.753 m)    Weight:   Wt Readings from Last 1 Encounters:  05/18/20 84.6 kg    Ideal Body Weight:  72.7 kg  BMI:  Body mass index is 27.54 kg/m.  Estimated Nutritional Needs:   Kcal:  2122-4825  Protein:  130-145 grams  Fluid:  > 2.3 L    Adam Fowler, RD, LDN, [place] Registered Dietitian II Certified Diabetes Care and Education Specialist Please refer to [facility] for RD and/or RD on-call/weekend/after hours pager

## 2020-05-21 NOTE — Plan of Care (Signed)

## 2020-05-22 ENCOUNTER — Encounter (HOSPITAL_COMMUNITY): Payer: Self-pay | Admitting: Gastroenterology

## 2020-05-22 DIAGNOSIS — K311 Adult hypertrophic pyloric stenosis: Secondary | ICD-10-CM

## 2020-05-22 LAB — CBC WITH DIFFERENTIAL/PLATELET
Abs Immature Granulocytes: 0.76 10*3/uL — ABNORMAL HIGH (ref 0.00–0.07)
Basophils Absolute: 0.2 10*3/uL — ABNORMAL HIGH (ref 0.0–0.1)
Basophils Relative: 1 %
Eosinophils Absolute: 0.6 10*3/uL — ABNORMAL HIGH (ref 0.0–0.5)
Eosinophils Relative: 2 %
HCT: 34.7 % — ABNORMAL LOW (ref 39.0–52.0)
Hemoglobin: 11.1 g/dL — ABNORMAL LOW (ref 13.0–17.0)
Immature Granulocytes: 3 %
Lymphocytes Relative: 4 %
Lymphs Abs: 1.2 10*3/uL (ref 0.7–4.0)
MCH: 28 pg (ref 26.0–34.0)
MCHC: 32 g/dL (ref 30.0–36.0)
MCV: 87.6 fL (ref 80.0–100.0)
Monocytes Absolute: 1.9 10*3/uL — ABNORMAL HIGH (ref 0.1–1.0)
Monocytes Relative: 7 %
Neutro Abs: 24.1 10*3/uL — ABNORMAL HIGH (ref 1.7–7.7)
Neutrophils Relative %: 83 %
Platelets: 278 10*3/uL (ref 150–400)
RBC: 3.96 MIL/uL — ABNORMAL LOW (ref 4.22–5.81)
RDW: 14.5 % (ref 11.5–15.5)
WBC: 28.8 10*3/uL — ABNORMAL HIGH (ref 4.0–10.5)
nRBC: 0 % (ref 0.0–0.2)

## 2020-05-22 LAB — GLUCOSE, CAPILLARY
Glucose-Capillary: 128 mg/dL — ABNORMAL HIGH (ref 70–99)
Glucose-Capillary: 150 mg/dL — ABNORMAL HIGH (ref 70–99)
Glucose-Capillary: 131 mg/dL — ABNORMAL HIGH (ref 70–99)
Glucose-Capillary: 153 mg/dL — ABNORMAL HIGH (ref 70–99)
Glucose-Capillary: 164 mg/dL — ABNORMAL HIGH (ref 70–99)
Glucose-Capillary: 168 mg/dL — ABNORMAL HIGH (ref 70–99)
Glucose-Capillary: 171 mg/dL — ABNORMAL HIGH (ref 70–99)

## 2020-05-22 LAB — PATHOLOGIST SMEAR REVIEW

## 2020-05-22 LAB — BASIC METABOLIC PANEL
Anion gap: 9 (ref 5–15)
BUN: 18 mg/dL (ref 8–23)
CO2: 24 mmol/L (ref 22–32)
Calcium: 8.6 mg/dL — ABNORMAL LOW (ref 8.9–10.3)
Chloride: 100 mmol/L (ref 98–111)
Creatinine, Ser: 0.65 mg/dL (ref 0.61–1.24)
GFR calc Af Amer: >60 mL/min (ref >60)
GFR calc non Af Amer: >60 mL/min (ref >60)
Glucose, Bld: 171 mg/dL — ABNORMAL HIGH (ref 70–99)
Potassium: 3.9 mmol/L (ref 3.5–5.1)
Sodium: 133 mmol/L — ABNORMAL LOW (ref 135–145)

## 2020-05-22 LAB — HEPATIC FUNCTION PANEL
ALT: 16 U/L (ref 0–44)
AST: 20 U/L (ref 15–41)
Albumin: 1.6 g/dL — ABNORMAL LOW (ref 3.5–5.0)
Alkaline Phosphatase: 276 U/L — ABNORMAL HIGH (ref 38–126)
Bilirubin, Direct: 0.2 mg/dL (ref 0.0–0.2)
Indirect Bilirubin: 0.4 mg/dL (ref 0.3–0.9)
Total Bilirubin: 0.6 mg/dL (ref 0.3–1.2)
Total Protein: 5.4 g/dL — ABNORMAL LOW (ref 6.5–8.1)

## 2020-05-22 LAB — MAGNESIUM: Magnesium: 1.8 mg/dL (ref 1.7–2.4)

## 2020-05-22 LAB — PHOSPHORUS: Phosphorus: 3.2 mg/dL (ref 2.5–4.6)

## 2020-05-22 MED ORDER — TRAVASOL 10 % IV SOLN
INTRAVENOUS | Status: DC
  Filled 2020-05-22: qty 1320

## 2020-05-22 MED ORDER — INSULIN ASPART 100 UNIT/ML ~~LOC~~ SOLN
0.0000 [IU] | Freq: Three times a day (TID) | SUBCUTANEOUS | Status: DC
  Administered 2020-05-22 – 2020-05-24 (×6): 3 [IU] via SUBCUTANEOUS
  Administered 2020-05-24: 5 [IU] via SUBCUTANEOUS
  Administered 2020-05-25: 3 [IU] via SUBCUTANEOUS

## 2020-05-22 NOTE — Progress Notes (Signed)
Occupational Therapy Treatment Patient Details Name: Adam Fowler MRN: [id number] DOB: Jul 06, 1956 Today's Date: 05/22/2020    History of present illness Pt is a 64 yo male who presents to the ED with severe abdominal pain, nausea and vomiting. Found to have post ERCP acute pancreatits. Pt developed Hypoxic resp failure and was Intubated 8/22  and extubated 8/24. Pt also with new onset a fib and acute ileus. PMH includes HTN and ERCP.    OT comments  Pt making steady progress, goals met and updated. Completed LB ADL with set up/S. Ambulated @ 200 ft on 2L with SpO2 remaining above 90 with 3 rest breaks and vc for pursed lip breathing. Pt desats to 88 on RA after walking 15 ft. Completed incentive spirometer x 10 - pt states he has not been completing this exercise. Encouraged pt to increase his time OOB, ambulate with nsg staff, complete as much of his ADL tasks as possible and use his incentive spirometer. Discussed need to increase mobility with nsg staff. Will continue to follow acutely.   Follow Up Recommendations  Home health OT;Supervision/Assistance - 24 hour    Equipment Recommendations  3 in 1 bedside commode    Recommendations for Other Services      Precautions / Restrictions Precautions Precautions: Fall Precaution Comments: abdominal drain, NGT; watch O2 sats       Mobility Bed Mobility Overal bed mobility: Modified Independent                Transfers Overall transfer level: Needs assistance   Transfers: Sit to/from Stand;Stand Pivot Transfers Sit to Stand: Supervision Stand pivot transfers: Supervision            Balance Overall balance assessment: Needs assistance   Sitting balance-Leahy Scale: Good       Standing balance-Leahy Scale: Poor                             ADL either performed or assessed with clinical judgement   ADL Overall ADL's : Needs assistance/impaired     Grooming: Set up;Supervision/safety;Standing   Upper  Body Bathing: Set up;Sitting   Lower Body Bathing: Supervison/ safety;Set up       Lower Body Dressing: Supervision/safety   Toilet Transfer: Supervision/safety;Ambulation           Functional mobility during ADLs: Supervision/safety;Rolling walker;Cueing for safety       Vision       Perception     Praxis      Cognition Arousal/Alertness: Awake/alert Behavior During Therapy: WFL for tasks assessed/performed;Anxious (anxious at times) Overall Cognitive Status: Impaired/Different from baseline Area of Impairment: Attention;Memory;Awareness                   Current Attention Level: Selective Memory: Decreased short-term memory Following Commands: Follows one step commands consistently Safety/Judgement: Decreased awareness of deficits Awareness: Emergent   General Comments: slower processing; PA had asked about last BM; pt began talking about apple juice; tangential at times; Pt not sleeping well which could also be affecting his cognition; will further assess        Exercises Other Exercises Other Exercises: incentive spirometer x 10 - able to pull 1027m   Shoulder Instructions       General Comments Increased WOB with exertion; desat to 88 on RA    Pertinent Vitals/ Pain       Pain Assessment: Faces Faces Pain Scale: Hurts a little bit Pain Location:  discomfort at drain site Pain Descriptors / Indicators: Discomfort;Grimacing  Home Living                                          Prior Functioning/Environment              Frequency  Min 2X/week        Progress Toward Goals  OT Goals(current goals can now be found in the care plan section)  Progress towards OT goals: Progressing toward goals;Goals met and updated - see care plan  Acute Rehab OT Goals Patient Stated Goal: go home OT Goal Formulation: With patient Time For Goal Achievement: 05/31/20 Potential to Achieve Goals: Good ADL Goals Pt Will Perform  Grooming: with supervision;standing Pt Will Perform Lower Body Bathing: with modified independence;sit to/from stand Pt Will Perform Lower Body Dressing: with modified independence;sit to/from stand Pt Will Transfer to Toilet: with modified independence;ambulating Pt Will Perform Toileting - Clothing Manipulation and hygiene: with modified independence;sit to/from stand Pt/caregiver will Perform Home Exercise Program: Increased strength;With theraband;With written HEP provided;Independently Additional ADL Goal #1: Pt will independently verbalize 3 energy conservation strategies  Plan Discharge plan remains appropriate    Co-evaluation                 AM-PAC OT "6 Clicks" Daily Activity     Outcome Measure   Help from another person eating meals?: None Help from another person taking care of personal grooming?: A Little Help from another person toileting, which includes using toliet, bedpan, or urinal?: A Little Help from another person bathing (including washing, rinsing, drying)?: A Little Help from another person to put on and taking off regular upper body clothing?: A Little Help from another person to put on and taking off regular lower body clothing?: A Little 6 Click Score: 19    End of Session Equipment Utilized During Treatment: Gait belt;Oxygen (2L)  OT Visit Diagnosis: Unsteadiness on feet (R26.81);Other abnormalities of gait and mobility (R26.89);Muscle weakness (generalized) (M62.81);Other symptoms and signs involving cognitive function   Activity Tolerance Patient tolerated treatment well   Patient Left in chair;with call bell/phone within reach;with chair alarm set   Nurse Communication Mobility status;Other (comment) (encourage time OOB and ambulation)        Time: 7494-4967 OT Time Calculation (min): 44 min  Charges: OT General Charges $OT Visit: 1 Visit OT Treatments $Self Care/Home Management : 38-52 mins  Maurie Boettcher, OT/L   Acute OT Clinical  Specialist [place] Pager [phone] Office [phone]    [facility] 05/22/2020, 1:38 PM

## 2020-05-22 NOTE — Progress Notes (Addendum)
PHARMACY - TOTAL PARENTERAL NUTRITION CONSULT NOTE   Indication: severe pancreatitis  Patient Measurements: Height: 5' 9"  (175.3 cm) Weight: 84.6 kg (186 lb 8.2 oz) IBW/kg (Calculated) : 70.7 TPN AdjBW (KG): 86.1 Body mass index is 27.54 kg/m. Usual Weight:    Assessment: Admitted 04/26/20. Post-ERCP 8/18 and biliary stent exchange for persistent postoperative bile leak related to recent cholecystectomy with abscess status post percutaneous drainage. Developed acute pancreatitis.  - Prolonged hospitalization complicated by VDRF (extubated 8/24), AKI, encephalopathy, and afib. - 9/11: Still with very poor oral intake. Start TPN for Acute post ERCP pancreatitis/ ileus and persistent biliary leak. Significant gastric fluid retention from functional outlet obstruction. NG to suction   Glucose / Insulin: A1C 5.6. CBGs <180. 13 units mSSI used in 24 hours Electrolytes: Na 133 low. K 3.9 Mg 1.8 AdjCa 10.5 high. (on Tums TID) Renal: Scr <1.  LFTs / TGs: AST/ALT wnl. Alk phos 753>>300; TG 146 Prealbumin / albumin: Prealbumin 7.2. Albumin 1.6 Intake / Output; MIVF: No U/O recorded. LBM 05/17/20. NG 323m. Drains 600 out/24hr. I/O -855.7 (per chart, incomplete documentation)   GI Imaging: 9/5: CT with signs of sequela of severe pancreatitis 9/7: HIDA with persistent bile leak with accumulations and paracolic gutter. 9/9: CT: pancreatitis (peripancreatic fat stranding and peripancreatic fluid, percutaneous drain at the gallbladder fossa, unchanged biliary stent with no intrahepatic biliary duct dilatation, bilateral pleural effusions.   Surgeries / Procedures:  8/18 ERCP and biliary stent exchange 9/9: Unsuccessful ERCP 9/10: Paracentesis 1.4L out   GI Meds: Acidophilus 2 daily, Reglan 558mIV TID, IV PPI/12h, Miralax daily  Central access: PICC placed 9/11. TPN start date: 9/11  Nutritional Goals (per RD recommendation on 05/18/20): kCal: 2300-2500, Protein: 130-145, Fluid: >2.3L Goal TPN  rate is 100 mL/hr (provides 132 g of protein and 2342 kcals per day)  Current Nutrition: CLD 9/11 po intake 370 of CLD started in PM>>9/13 eating ~10% per RN   Plan:  Continue unconcentrated TPN with rate of 10059mr at 1800 (provides 132g AA, 67g lipids, 336g dextrose, and 2342kcal) Electrolytes in TPN: increase to 100m68m of Na, increase to 8mEq3mof Mg, decr to 0mEq/33mf Calcium, and continue 35 mEq/L of K, and 10mmol32mf Phos. Cl:Ac ratio 1:1 Add standard MVI and trace elements to TPN Continue Moderate SSI, adjust frequency to q8h and adjust as needed  Monitor Daily TPN labs while adjusting to goal and cycling, consider cycling if planning on starting diet.  Change meds to po (MV, Reglan, PPI) when tolerating diet reliably  Sarah RRedgie Grayeracy Student  I discussed / reviewed the pharmacy note by Dr. RickettWalden Fieldagree with the student's findings and plans as documented.  Trebor Galdamez CBenetta SparD, BCPS, BCCP Clinical Pharmacist  Please check AMION for all [facility] After 10:00 PM, call [phone]

## 2020-05-22 NOTE — Progress Notes (Signed)
PROGRESS NOTE    Adam Fowler  LTJ:[id number] DOB: 30-Jul-1956 DOA: 04/26/2020 PCP: Melony Overly, MD    Brief Narrative:  Patient admitted to the hospital working diagnosis of post ERCP acute pancreatitis.Prolonged hospitalization with multiple complications, including respiratory failure (required mechanical ventilation), AKI, encephalopathy and atrial fibrillation.  64 year old male with past medical history significant for hypertension. Heunderwent ERCP on 8/18 and biliary stent exchange for persistent postoperative bile leak,related to recent cholecystectomy with abscess status post percutaneous drainage. Patient developed severe epigastric abdominal pain, associated with nausea, and vomiting after ERCP procedure. On his initial physical examination blood pressure 137/88, heart rate 114, respiratoryrate23, oxygen saturation 92%,his lungs were clear to auscultation bilaterally, heart S1-S2, present, tachycardic, his abdomen was tender to palpation,itwas distended, no rebound or guarding,no lower extremity edema.  CT of the abdomen pelvis show changes consistent with acute pancreatitis, no abscess or drainable fluid collection. Patient was admitted to the medical ward, he received supportive medical therapy including intravenous fluids, analgesics and IV antibiotics.   His hospitalization has been complicated by metabolic encephalopathy,acute hypoxicrespiratory failure, renal failure, new onset paroxysmal atrial fibrillation and acute ileus.  08/21-08/22 rapid response due to increase work of breathing and atrial fibrillation, required invasive mechanical ventilation. He was treated for volume overload and aspiration pneumonia. Received diuresis and antibiotic therapy with Zosyn.   Liberated from mechanical ventilation on 08/24 and transitioned to nasal cannula with good toleration.   Patient continue to have leukocytosis and abdominal pain, repeat CT of the abdomen  pelvis(8/25)markedly diffuse pancreatic inflammation with fat stranding and free fluid.  His antibiotic therapy was switched from Zosyn to meropenem on 8/27. Hewasplaced on tube feeds via NG tubefor several days.  On 09/03 Ng tube was removed, patient tolerating nutritional support with by mouth intake.  Slowly patient improving, but continue to have high output biliary drain, despite stent in appropriate location and stable liver enzymes. 09/05.CT abdomen with sings of sequela of severe pancreatitis. Stable positioning of internal biliary stent with no intrahepatic biliary duct dilatation.  HIDA scanfrom 09/07 with persistent bile leak accumulating in the lateral aspect of the right upper quadrant, paracolic gutter.  Patient with very poor oral intake.  09/09 unsuccessful ERCP scope not able to pass beyond D1/D2 sweep.Patient had significant gastric fluid retention, related to functional outlet obstruction. NG tubeinserted andconnected to intermittent suction.  CT abdomen and pelvis 09/09 with pancreatitis (peripancreatic fat stranding and peripancreatic fluid), percutaneous drain at the gallbladder fossa, unchanged biliary stent with no intrahepatic biliary duct dilatation, bilateral pleural effusions.   09/10 Ordered PICC line to be placed to start TPN.  Because a decreased biliary drain output and positive ascites, IR has been re-consulted for percutaneous drain replacement of upsize, plus diagnostic paracentesis. Drain tube well-positioned and normally functioning, the fistulous communication between the drainage catheter and the cystic duct appears to be nearly resolved.  1.4 L of bloody ascitic fluids removed.  Follow up chest film with right base atelectasis.   Today  NG tube was accidentally was removed by patient.   Assessment & Plan:   Principal Problem:   Post-ERCP acute pancreatitis Active Problems:   Severe sepsis (HCC)   Polycythemia   AKI  (acute kidney injury) ([place])   Metabolic acidosis   Acute respiratory failure with hypoxia (HCC)   Bilateral pleural effusion   Ileus (HCC)   Postoperative bile leak   1. Acute post ERCP pancreatitis/ ileus.Persistent biliaryleak per HIDA scan.The tube drainage has been revised and working properly/ unsuccessfully  repeat ERCP (09/09). SP paracentesis 1.4 L 09/10. Drain output is 600 cc over last 24 H down from 900.  His NG tube came out this am, unintentionally, output documented before removal was 900 cc. Patient with positive bowel movement and flatus.   Continue pain control with oxycodone and hydromorphone. Plan to continue supportive medical therapy for now.   Follow surgery, GI and IR recommendations. Continue out of bed to chair, mobility with PT and OT.   2. Acute hypoxic respiratory failure/bilateral pleural effusions more left than right, likely transudate. His chest film has a right base atelectasis, with no frank pulmonary edema and improving bilateral pleural effusions.  Will continue with airway clearing techniques with incentive spirometer. Close follow up on cell count. For now continue to hold on antibiotic therapy, he just completed 14 days of meropenem for pancreatitis.   Continue oxymetry monitoring and as needed supplemental 02 per [place].   3. AKIwith hypokalemia/ hypomagensemia.Stable renal function and electrolytes, patient is tolerating well TPN.  Na 139, K 3,9, Cl 100, bicarb 24, BUN 18 and cr at 0,65.  Continue to follow up renal function and electrolytes per TPN protocol.    4. Paroxysmal atrial fibrillation/ transitory.Converted to simus rhythm. Has a low risk of thrombosis CHADS vasc score of 1. Will hold on antithrombotic therapy for now. Now off telemetry for now.  6. Anemiaof chronic disease.Continue close monitor hgb and hct, threshold for PRBC transfusion Hgb less than 7.  7. Acute metabolic encephalopathy/ depression and anxiety.  clinically resolved, continue withsertralineand as needed trazodone at night for sleep.  8. HTN/ dyslipidemia.( [place] on lisinopril and hctz combination).Continue with pravastatin. Holding antihypertensive medications to prevent hypotension.  11. Transitory hyperglycemia. Hgb A1c 5.6.fasting glucose this am is 171. Continue insulin therapy per TPN protocol.   12. Moderate calorie protein malnutrition.Continue with TPNper pharmacy protocol with good toleration. Patient allowed to have clear liquid diet.   13. Depression and anxiety.Continue with sertraline and trazodone   Status is: Inpatient  Remains inpatient appropriate because:IV treatments appropriate due to intensity of illness or inability to take PO   Dispo:  Patient From:    Planned Disposition: Home  Expected discharge date: 05/28/20  Medically stable for discharge:  no. Patient is hoping to go home early next week,    DVT prophylaxis: Enoxaparin   Code Status:   full  Family Communication:  I spoke with patient's wife at the bedside, we talked in detail about patient's condition, plan of care and prognosis and all questions were addressed.    Consultants:   GI   Surgery   IR   Procedures:   ERCP  Paracentesis   Antimicrobials:   Meropenem completed.     Subjective: Removed accidentally NG tube, no nausea or vomiting, his abdominal pain is controlled with analgesics, has intermittent back pain, this am tolerated liquids well.   Objective: Vitals:   05/21/20 1453 05/21/20 1900 05/22/20 0351 05/22/20 0734  BP: 138/70 140/68 (!) 149/93 129/82  Pulse: 90 88 (!) 107 98  Resp: 17 17 18 17   Temp: (!) 97.5 F (36.4 C) 97.7 F (36.5 C) 98 F (36.7 C) 98.3 F (36.8 C)  TempSrc: Oral Oral Oral Oral  SpO2: 91% 93% 90% 96%  Weight:      Height:        Intake/Output Summary (Last 24 hours) at 05/22/2020 1109 Last data filed at 05/22/2020 0900 Gross per 24 hour  Intake 1124.31 ml   Output 1050 ml  Net 74.31 ml   Filed Weights   05/08/20 0357 05/13/20 0306 05/18/20 0500  Weight: 86.9 kg 86 kg 84.6 kg    Examination:   General: Not in pain or dyspnea, deconditioned  Neurology: Awake and alert, non focal  E ENT: mild pallor, no icterus, oral mucosa moist Cardiovascular: No JVD. S1-S2 present, rhythmic, no gallops, rubs, or murmurs. No lower extremity edema. Pulmonary: positive reath sounds bilaterally, decreased air movement at a bases, no wheezing, rhonchi or rales. Gastrointestinal. Abdomen soft, mild distended but not tender to superficial palpation.,  Skin. No rashes Musculoskeletal: no joint deformities     Data Reviewed: I have personally reviewed following labs and imaging studies  CBC: Recent Labs  Lab 05/16/20 1905 05/16/20 1905 05/17/20 1812 05/18/20 0513 05/19/20 1214 05/20/20 1834 05/21/20 0418  WBC 16.0*   < > 26.9* 31.3* 22.4* 26.4* 22.5*  NEUTROABS 13.9*  --  26.4* 29.5* 19.8*  --  19.3*  HGB 12.0*   < > 12.2* 11.5* 11.8* 10.8* 10.3*  HCT 38.3*   < > 38.9* 35.6* 37.6* 34.3* 33.3*  MCV 88.0   < > 87.6 87.5 88.7 86.8 89.0  PLT 360   < > 335 332 306 233 229   < > = values in this interval not displayed.   Basic Metabolic Panel: Recent Labs  Lab 05/18/20 0513 05/19/20 1214 05/20/20 0314 05/21/20 0418 05/22/20 0424  NA 135 132* 131* 132* 133*  K 4.6 3.8 3.7 3.6 3.9  CL 98 97* 97* 99 100  CO2 25 23 26 24 24   GLUCOSE 152* 131* 135* 182* 171*  BUN 15 15 13 15 18   CREATININE 0.90 0.84 0.70 0.68 0.65  CALCIUM 8.3* 8.4* 8.1* 8.4* 8.6*  MG  --  1.8 2.0 1.9 1.8  PHOS  --  3.4 2.4* 3.0 3.2   GFR: Estimated Creatinine Clearance: 93.3 mL/min (by C-G formula based on SCr of 0.65 mg/dL). Liver Function Tests: Recent Labs  Lab 05/17/20 1812 05/18/20 0513 05/19/20 1214 05/21/20 0418 05/22/20 0424  AST 88* 51* 24 17 20   ALT 60* 45* 29 18 16   ALKPHOS 1,011* 753* 500* 300* 276*  BILITOT 2.2* 1.3* 1.1 0.7 0.6  PROT 5.6* 5.2* 5.5*  5.2* 5.4*  ALBUMIN 2.0* 1.9* 1.9* 1.6* 1.6*   Recent Labs  Lab 05/16/20 1905  LIPASE 27   No results for input(s): AMMONIA in the last 168 hours. Coagulation Profile: Recent Labs  Lab 05/16/20 1905  INR 1.3*   Cardiac Enzymes: No results for input(s): CKTOTAL, CKMB, CKMBINDEX, TROPONINI in the last 168 hours. BNP (last 3 results) No results for input(s): PROBNP in the last 8760 hours. HbA1C: No results for input(s): HGBA1C in the last 72 hours. CBG: Recent Labs  Lab 05/21/20 1646 05/21/20 2201 05/22/20 0105 05/22/20 0349 05/22/20 0731  GLUCAP 131* 148* 128* 150* 131*   Lipid Profile: Recent Labs    05/19/20 1214 05/21/20 0419  TRIG 145 146   Thyroid Function Tests: No results for input(s): TSH, T4TOTAL, FREET4, T3FREE, THYROIDAB in the last 72 hours. Anemia Panel: No results for input(s): VITAMINB12, FOLATE, FERRITIN, TIBC, IRON, RETICCTPCT in the last 72 hours.    Radiology Studies: I have reviewed all of the imaging during this hospital visit personally     Scheduled Meds: . acetaminophen  650 mg Oral Q6H  . acidophilus  2 capsule Oral Daily  . Chlorhexidine Gluconate Cloth  6 each Topical Daily  . enoxaparin (LOVENOX) injection  40 mg Subcutaneous  Q24H  . insulin aspart  0-15 Units Subcutaneous Q8H  . mouth rinse  15 mL Mouth Rinse BID  . oxyCODONE  10 mg Oral Q12H  . pantoprazole (PROTONIX) IV  40 mg Intravenous Q12H  . polyethylene glycol  17 g Oral Daily  . pravastatin  20 mg Oral q1800  . sertraline  100 mg Oral Daily  . sodium chloride flush  10-40 mL Intracatheter Q12H  . traZODone  50 mg Oral QHS   Continuous Infusions: . sodium chloride Stopped (05/07/20 0923)  . TPN ADULT (ION) 100 mL/hr at 05/21/20 1730  . TPN ADULT (ION)       LOS: 25 days        Kamani Lewter Gerome Apley, MD

## 2020-05-22 NOTE — Progress Notes (Signed)
Pt removed NG tube, accidentally. GI MD Dr Garrel Ridgel made aware. No new orders given to reinsert at this time. Report given to dayshift RN.

## 2020-05-22 NOTE — Progress Notes (Signed)
G.I.- PA has been notified regarding pt's NGT out early this am. No N&V noted at this time.No new orders for new NGT placement.

## 2020-05-22 NOTE — Progress Notes (Signed)
Physical Therapy Treatment Patient Details Name: Adam Fowler MRN: [id number] DOB: [birth year] Today's Date: 05/22/2020    History of Present Illness Pt is a 64 yo male who presents to the ED with severe abdominal pain, nausea and vomiting. Found to have post ERCP acute pancreatits. Pt developed Hypoxic resp failure and was Intubated 8/22  and extubated 8/24. Pt also with new onset a fib and acute ileus. PMH includes HTN and ERCP.     PT Comments    Patient received in bed asleep but moaning in pain, wakes easily and reports that he has "been this way for hours and is feeling very locked up". Able to mobilize with PT at a S-min guard basis but did require MinA to get to EOB today, possibly due to stiffness and pain at beginning of session. Tolerated gait training approximately 137fx2 holding onto IV pole with one seated rest break; SPO2 in 90s on 2LPM but HR as high as 120BPM with activity, patient with 3/4 DOE. Able to maintain balance in standing with no UE support but with wide BOS while using bathroom in standing. Left up in recliner with all needs met, spO2 92-95% on room air at rest, RN aware of patient status.     Follow Up Recommendations  Home health PT;Supervision for mobility/OOB     Equipment Recommendations  None recommended by PT    Recommendations for Other Services       Precautions / Restrictions Precautions Precautions: Fall Precaution Comments: abdominal drain, NGT; watch O2 sats Restrictions Weight Bearing Restrictions: No    Mobility  Bed Mobility Overal bed mobility: Needs Assistance Bed Mobility: Supine to Sit     Supine to sit: Min assist     General bed mobility comments: MinA to fully get trunk up to EOB, increased time  Transfers Overall transfer level: Needs assistance Equipment used:  (IV pole) Transfers: Sit to/from Stand Sit to Stand: Supervision Stand pivot transfers: Supervision       General transfer comment: S for general safety and  line management, cues for hand placement and safety  Ambulation/Gait Ambulation/Gait assistance: Min guard Gait Distance (Feet): 300 Feet ([id number]) Assistive device: IV Pole Gait Pattern/deviations: Step-through pattern;Decreased stride length;Trunk flexed Gait velocity: decreased   General Gait Details: steady with IV pole, but with decreased gait speed; DOE 3/4, SPO2 remained in 90s with 2LPM O2 but HR up to as much as 120BPM   Stairs             Wheelchair Mobility    Modified Rankin (Stroke Patients Only)       Balance Overall balance assessment: Needs assistance Sitting-balance support: Feet supported;No upper extremity supported Sitting balance-Leahy Scale: Good     Standing balance support: During functional activity;Single extremity supported Standing balance-Leahy Scale: Fair                              Cognition Arousal/Alertness: Awake/alert Behavior During Therapy: WFL for tasks assessed/performed;Anxious Overall Cognitive Status: Impaired/Different from baseline Area of Impairment: Attention;Memory;Awareness                   Current Attention Level: Selective Memory: Decreased short-term memory Following Commands: Follows one step commands consistently;Follows one step commands with increased time Safety/Judgement: Decreased awareness of deficits;Decreased awareness of safety Awareness: Emergent   General Comments: still with slow processing and reduced safety awarness- at one point tried to hold both IV pole and O2 tank  while walking, just needed cues for general safety throughout session      Exercises Other Exercises Other Exercises: incentive spirometer x 10 - able to pull 10107m    General Comments General comments (skin integrity, edema, etc.): Increased WOB with exertion; desat to 88 on RA      Pertinent Vitals/Pain Pain Assessment: Faces Faces Pain Scale: Hurts little more Pain Location: neck pain Pain  Descriptors / Indicators: Discomfort;Grimacing Pain Intervention(s): Limited activity within patient's tolerance;Monitored during session;Repositioned    Home Living                      Prior Function            PT Goals (current goals can now be found in the care plan section) Acute Rehab PT Goals Patient Stated Goal: go home PT Goal Formulation: With patient Time For Goal Achievement: 05/30/20 Potential to Achieve Goals: Good Progress towards PT goals: Progressing toward goals    Frequency    Min 3X/week      PT Plan Current plan remains appropriate    Co-evaluation              AM-PAC PT "6 Clicks" Mobility   Outcome Measure  Help needed turning from your back to your side while in a flat bed without using bedrails?: None Help needed moving from lying on your back to sitting on the side of a flat bed without using bedrails?: A Little Help needed moving to and from a bed to a chair (including a wheelchair)?: None Help needed standing up from a chair using your arms (e.g., wheelchair or bedside chair)?: None Help needed to walk in hospital room?: A Little Help needed climbing 3-5 steps with a railing? : A Little 6 Click Score: 21    End of Session Equipment Utilized During Treatment: Oxygen Activity Tolerance: Patient tolerated treatment well Patient left: in chair;with call bell/phone within reach Nurse Communication: Mobility status;Other (comment) (satting 92-95% on room air, on room air in chair; IV occluded and beeping) PT Visit Diagnosis: Unsteadiness on feet (R26.81);Muscle weakness (generalized) (M62.81);Difficulty in walking, not elsewhere classified (R26.2)     Time: [address]373PT Time Calculation (min) (ACUTE ONLY): 32 min  Charges:  $Gait Training: 8-22 mins $Therapeutic Activity: 8-22 mins                     KWindell Norfolk DPT, PN1   Supplemental Physical Therapist CWilliams   Pager [phone] Rehab Office  [phone]

## 2020-05-22 NOTE — Progress Notes (Signed)
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 Well-positioned and normally functioning right upper quadrant drainage catheter. The fistulous communication between the drainage catheter and the cystic duct appears to be nearly resolved. This likely explains the decreased output. No exchange or upsize Necessary.  600 cc yesterday 100 cc in bag  1.4 L paracentesis 9/10: bloody ascitic fluid  Pt up in bed Gradually getting some better Clear liq diet NG out  Allergies: Patient has no known allergies.  Medications: Prior to Admission medications   Medication Sig Start Date End Date Taking? Authorizing Provider  Calcium Carb-Cholecalciferol (CALCIUM+D3 PO) Take 1 tablet by mouth daily.   Yes [provider]  Coenzyme Q10 (COQ10) 400 MG CAPS Take 800 mg by mouth in the morning.    Yes [provider]  Cyanocobalamin (VITAMIN B-12) 5000 MCG TBDP Take 5,000 mcg by mouth daily.   Yes [provider]  GLUCOSAMINE-CHONDROITIN PO Take 1 tablet by mouth in the morning and at bedtime.    Yes [provider]  Javier Docker Oil 1000 MG CAPS Take 1,000 mg by mouth in the morning and at bedtime.    Yes [provider]  lisinopril-hydrochlorothiazide (ZESTORETIC) 10-12.5 MG tablet Take 1 tablet by mouth daily. 03/04/20  Yes [provider]  Moringa Oleifera (MORINGA PO) Take 1,000 mg by mouth 2 (two) times daily.   Yes [provider]  Multiple Vitamin (MULTIVITAMIN WITH MINERALS) TABS tablet Take 1 tablet by mouth daily.   Yes [provider]  ondansetron (ZOFRAN) 4 MG tablet Take 1 tablet (4 mg total) by mouth every 8 (eight) hours as needed for nausea or vomiting. 04/25/20  Yes Mansouraty, Telford Nab., MD  OVER THE COUNTER MEDICATION Take 1 tablet by  mouth See admin instructions. [facility] otc tablet- Take 1 tablet by mouth once a day   Yes [provider]  oxyCODONE (OXY IR/ROXICODONE) 5 MG immediate release tablet Take 5 mg by mouth 2 (two) times daily as needed (for pain).  04/25/20  Yes [provider]  pravastatin (PRAVACHOL) 80 MG tablet Take 40 mg by mouth in the morning and at bedtime.   Yes [provider]  Red Yeast Rice 600 MG CAPS Take 1,200 mg by mouth at bedtime.    Yes [provider]  sertraline (ZOLOFT) 100 MG tablet Take 100 mg by mouth daily. 11/08/19  Yes [provider]  TURMERIC PO Take 1,500 mg by mouth 2 (two) times daily.   Yes [provider]  zolpidem (AMBIEN) 5 MG tablet Take 5 mg by mouth at bedtime. 12/29/19  Yes [provider]  aspirin EC 81 MG tablet Take 81 mg by mouth 2 (two) times daily. Patient not taking: Reported on 04/26/2020    [provider]  Calcium Carb-Cholecalciferol (CALCIUM 1000 + D PO) Take 1 tablet by mouth daily. Patient not taking: Reported on 04/26/2020    [provider]  ibuprofen (ADVIL) 200 MG tablet Take 400-600 mg by mouth every 6 (six) hours as needed for headache or moderate pain.    [provider]     Vital Signs: BP 129/82 (BP Location: Left Arm)   Pulse 98   Temp 98.3 F (36.8 C) (Oral)   Resp 17   Ht 5\' 9"  (1.753 m)   Wt 186 lb  8.2 oz (84.6 kg)   SpO2 96%   BMI 27.54 kg/m   Physical Exam Skin:    General: Skin is warm.     Comments: Site is clean and dry NT no bleeding Crusty at site No sign of infection  Drain flushes easily OP is bile 600 cc yesterday-- 100 cc in bag      Imaging: DG Chest 1 View  Result Date: 05/21/2020 CLINICAL DATA:  Dyspnea. EXAM: CHEST  1 VIEW COMPARISON:  CT abdomen pelvis dated May 17, 2020. Chest x-ray dated May 03, 2020. FINDINGS: Enteric tube in the stomach. Right-sided PICC line with tip at the cavoatrial junction. The heart size  and mediastinal contours are within normal limits. Left greater than right bibasilar opacities persist. Unchanged small left pleural effusion. No pneumothorax. Unchanged cholecystostomy tube in the right upper quadrant. No acute osseous abnormality. IMPRESSION: 1. Unchanged bibasilar pneumonia and small left pleural effusion. Electronically Signed   By: Titus Dubin M.D.   On: 05/21/2020 14:04   IR CHOLANGIOGRAM EXISTING TUBE  Result Date: 05/19/2020 INDICATION: 64 year old male with a complex history of acute cholecystitis status post biliary leak following laparoscopic cholecystectomy status post placement of a biliary drainage catheter complicated by severe pancreatitis. Patient currently has a percutaneous drainage catheter in the gallbladder space and has also developed new mild ascites. His gallbladder fossa drain output had been fairly copious in there is prior imaging documentation of a fistulous communication with the cystic duct remanent. His drain output has suddenly slowed. He presents for drain injection with possible exchange/upsize if needed as well as ultrasound-guided paracentesis for diagnostic purposes. EXAM: 1. Drain injection 2. Ultrasound-guided paracentesis MEDICATIONS: The patient is currently admitted to the hospital and receiving intravenous antibiotics. The antibiotics were administered within an appropriate time frame prior to the initiation of the procedure. ANESTHESIA/SEDATION: None. COMPLICATIONS: None immediate. PROCEDURE: Informed written consent was obtained from the patient after a thorough discussion of the procedural risks, benefits and alternatives. All questions were addressed. Maximal Sterile Barrier Technique was utilized including caps, mask, sterile gowns, sterile gloves, sterile drape, hand hygiene and skin antiseptic. A timeout was performed prior to the initiation of the procedure. Hand injection of contrast material was performed through the existing right upper  quadrant drainage catheter. The drainage catheter is patent. The contrast material fills a collapsed space adjacent to the drainage catheter. There is partial filling of the cystic duct, but no contrast material enters the common bile duct. Attention was turned to the right lower quadrant. The abdomen was interrogated with ultrasound. There is small volume ascites. A suitable skin entry site was selected and marked. Local anesthesia was attained by infiltration with 1% lidocaine. A small dermatotomy was made. A 6 French Safe-T-Centesis catheter was then advanced through the abdominal wall into the ascites. Aspiration was performed yielding 1.4 L of bloody ascites. IMPRESSION: 1. Well-positioned and normally functioning right upper quadrant drainage catheter. The fistulous communication between the drainage catheter and the cystic duct appears to be nearly resolved. This likely explains the decreased output. No exchange or upsize necessary. 2. Successful ultrasound-guided diagnostic paracentesis yielding 1.4 L of bloody ascitic fluid. Samples were sent as requested. Electronically Signed   By: Jacqulynn Cadet M.D.   On: 05/19/2020 12:41   [place] EKG SITE RITE  Result Date: 05/18/2020 If Site Rite image not attached, placement could not be confirmed due to current cardiac rhythm.  IR Paracentesis  Result Date: 05/19/2020 INDICATION: 64 year old male with a complex history of  acute cholecystitis status post biliary leak following laparoscopic cholecystectomy status post placement of a biliary drainage catheter complicated by severe pancreatitis. Patient currently has a percutaneous drainage catheter in the gallbladder space and has also developed new mild ascites. His gallbladder fossa drain output had been fairly copious in there is prior imaging documentation of a fistulous communication with the cystic duct remanent. His drain output has suddenly slowed. He presents for drain injection with possible  exchange/upsize if needed as well as ultrasound-guided paracentesis for diagnostic purposes. EXAM: 1. Drain injection 2. Ultrasound-guided paracentesis MEDICATIONS: The patient is currently admitted to the hospital and receiving intravenous antibiotics. The antibiotics were administered within an appropriate time frame prior to the initiation of the procedure. ANESTHESIA/SEDATION: None. COMPLICATIONS: None immediate. PROCEDURE: Informed written consent was obtained from the patient after a thorough discussion of the procedural risks, benefits and alternatives. All questions were addressed. Maximal Sterile Barrier Technique was utilized including caps, mask, sterile gowns, sterile gloves, sterile drape, hand hygiene and skin antiseptic. A timeout was performed prior to the initiation of the procedure. Hand injection of contrast material was performed through the existing right upper quadrant drainage catheter. The drainage catheter is patent. The contrast material fills a collapsed space adjacent to the drainage catheter. There is partial filling of the cystic duct, but no contrast material enters the common bile duct. Attention was turned to the right lower quadrant. The abdomen was interrogated with ultrasound. There is small volume ascites. A suitable skin entry site was selected and marked. Local anesthesia was attained by infiltration with 1% lidocaine. A small dermatotomy was made. A 6 French Safe-T-Centesis catheter was then advanced through the abdominal wall into the ascites. Aspiration was performed yielding 1.4 L of bloody ascites. IMPRESSION: 1. Well-positioned and normally functioning right upper quadrant drainage catheter. The fistulous communication between the drainage catheter and the cystic duct appears to be nearly resolved. This likely explains the decreased output. No exchange or upsize necessary. 2. Successful ultrasound-guided diagnostic paracentesis yielding 1.4 L of bloody ascitic fluid.  Samples were sent as requested. Electronically Signed   By: Jacqulynn Cadet M.D.   On: 05/19/2020 12:41    Labs:  CBC: Recent Labs    05/18/20 0513 05/19/20 1214 05/20/20 1834 05/21/20 0418  WBC 31.3* 22.4* 26.4* 22.5*  HGB 11.5* 11.8* 10.8* 10.3*  HCT 35.6* 37.6* 34.3* 33.3*  PLT 332 306 233 229    COAGS: Recent Labs    02/02/20 1902 02/02/20 2115 05/16/20 1905  INR 1.2 1.1 1.3*    BMP: Recent Labs    05/19/20 1214 05/20/20 0314 05/21/20 0418 05/22/20 0424  NA 132* 131* 132* 133*  K 3.8 3.7 3.6 3.9  CL 97* 97* 99 100  CO2 23 26 24 24   GLUCOSE 131* 135* 182* 171*  BUN 15 13 15 18   CALCIUM 8.4* 8.1* 8.4* 8.6*  CREATININE 0.84 0.70 0.68 0.65  GFRNONAA >60 >60 >60 >60  GFRAA >60 >60 >60 >60    LIVER FUNCTION TESTS: Recent Labs    05/17/20 1812 05/18/20 0513 05/19/20 1214 05/21/20 0418  BILITOT 2.2* 1.3* 1.1 0.7  AST 88* 51* 24 17  ALT 60* 45* 29 18  ALKPHOS 1,011* 753* 500* 300*  PROT 5.6* 5.2* 5.5* 5.2*  ALBUMIN 2.0* 1.9* 1.9* 1.6*    Assessment and Plan:  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

## 2020-05-22 NOTE — Progress Notes (Signed)
Daily Rounding Note  05/22/2020, 12:05 PM  LOS: 25 days   SUBJECTIVE:   Chief complaint:     NGT found dislodged this AM.  Has not been replaced.  Feels SOB.  C/o of abd bloating.  Passing flatus, no BM.  No n/v so far w NGT out.     OBJECTIVE:         Vital signs in last 24 hours:    Temp:  [97.5 F (36.4 C)-98.3 F (36.8 C)] 98.3 F (36.8 C) (09/14 0734) Pulse Rate:  [88-107] 98 (09/14 0734) Resp:  [17-18] 17 (09/14 0734) BP: (129-149)/(68-93) 129/82 (09/14 0734) SpO2:  [90 %-96 %] 96 % (09/14 0734) Last BM Date: 05/17/20 Filed Weights   05/08/20 0357 05/13/20 0306 05/18/20 0500  Weight: 86.9 kg 86 kg 84.6 kg   General: looks tired, ill   Heart: RRR Chest: diminished BS bil bases Abdomen: distended, NT, scant BS  Extremities: no CCE Neuro/Psych:  Oriented x 3.  Moves all 4s.    Intake/Output from previous day: 09/13 0701 - 09/14 0700 In: 644.3 [I.V.:644.3] Out: 1500 [Emesis/NG output:900; Drains:600]  Intake/Output this shift: Total I/O In: 480 [P.O.:480] Out: 200 [Drains:200]  Lab Results: Recent Labs    05/19/20 1214 05/20/20 1834 05/21/20 0418  WBC 22.4* 26.4* 22.5*  HGB 11.8* 10.8* 10.3*  HCT 37.6* 34.3* 33.3*  PLT 306 233 229   BMET Recent Labs    05/20/20 0314 05/21/20 0418 05/22/20 0424  NA 131* 132* 133*  K 3.7 3.6 3.9  CL 97* 99 100  CO2 26 24 24   GLUCOSE 135* 182* 171*  BUN 13 15 18   CREATININE 0.70 0.68 0.65  CALCIUM 8.1* 8.4* 8.6*   LFT Recent Labs    05/19/20 1214 05/21/20 0418 05/22/20 0424  PROT 5.5* 5.2* 5.4*  ALBUMIN 1.9* 1.6* 1.6*  AST 24 17 20   ALT 29 18 16   ALKPHOS 500* 300* 276*  BILITOT 1.1 0.7 0.6  BILIDIR  --   --  0.2  IBILI  --   --  0.4   PT/INR No results for input(s): LABPROT, INR in the last 72 hours. Hepatitis Panel No results for input(s): HEPBSAG, HCVAB, HEPAIGM, HEPBIGM in the last 72 hours.  Studies/Results: DG Chest 1  View  Result Date: 05/21/2020 CLINICAL DATA:  Dyspnea. EXAM: CHEST  1 VIEW COMPARISON:  CT abdomen pelvis dated May 17, 2020. Chest x-ray dated May 03, 2020. FINDINGS: Enteric tube in the stomach. Right-sided PICC line with tip at the cavoatrial junction. The heart size and mediastinal contours are within normal limits. Left greater than right bibasilar opacities persist. Unchanged small left pleural effusion. No pneumothorax. Unchanged cholecystostomy tube in the right upper quadrant. No acute osseous abnormality. IMPRESSION: 1. Unchanged bibasilar pneumonia and small left pleural effusion. Electronically Signed   By: Titus Dubin M.D.   On: 05/21/2020 14:04   Scheduled Meds:  acetaminophen  650 mg Oral Q6H   acidophilus  2 capsule Oral Daily   Chlorhexidine Gluconate Cloth  6 each Topical Daily   enoxaparin (LOVENOX) injection  40 mg Subcutaneous Q24H   insulin aspart  0-15 Units Subcutaneous Q8H   mouth rinse  15 mL Mouth Rinse BID   oxyCODONE  10 mg Oral Q12H   pantoprazole (PROTONIX) IV  40 mg Intravenous Q12H   polyethylene glycol  17 g Oral Daily   pravastatin  20 mg Oral q1800   sertraline  100 mg  Oral Daily   sodium chloride flush  10-40 mL Intracatheter Q12H   traZODone  50 mg Oral QHS   Continuous Infusions:  sodium chloride Stopped (05/07/20 0923)   TPN ADULT (ION) 100 mL/hr at 05/21/20 1730   TPN ADULT (ION)     PRN Meds:.HYDROmorphone (DILAUDID) injection, ipratropium-albuterol, lidocaine, metoprolol tartrate, oxyCODONE, polyvinyl alcohol, promethazine, sodium chloride flush, sodium chloride flush   ASSESMENT:   *  Persistent post chole bile leak despite bile duct stenting  *   Post ERCP pancreatitis, severe  *    GOO due to pancreatitis.  NGT came out overnight.   On TNA.     *   BB PNA, Left pleural effusion.  WBCs 22.5 yesterday.      *   Stable [place] anemia.    PLAN   *   KUB to assess BGP/GOO now.  Replace NGT if persistent GOO.   Ok to take po meds w sips, ice chips ok.    *   msgd Dr Cathlean Sauer re starting abx for pna.       Azucena Freed  05/22/2020, 12:05 PM Phone [phone]

## 2020-05-22 NOTE — Plan of Care (Signed)
  Problem: Education: Goal: Knowledge of General Education information will improve Description: Including pain rating scale, medication(s)/side effects and non-pharmacologic comfort measures Outcome: Progressing   Problem: Health Behavior/Discharge Planning: Goal: Ability to manage health-related needs will improve Outcome: Progressing   Problem: Clinical Measurements: Goal: Will remain free from infection Outcome: Progressing   Problem: Nutrition: Goal: Adequate nutrition will be maintained Outcome: Progressing   Problem: Coping: Goal: Level of anxiety will decrease Outcome: Progressing   Problem: Pain Managment: Goal: General experience of comfort will improve Outcome: Progressing   

## 2020-05-23 ENCOUNTER — Inpatient Hospital Stay (HOSPITAL_COMMUNITY): Payer: 59

## 2020-05-23 LAB — CBC WITH DIFFERENTIAL/PLATELET
Abs Immature Granulocytes: 0.73 10*3/uL — ABNORMAL HIGH (ref 0.00–0.07)
Basophils Absolute: 0.1 10*3/uL (ref 0.0–0.1)
Basophils Relative: 1 %
Eosinophils Absolute: 0.7 10*3/uL — ABNORMAL HIGH (ref 0.0–0.5)
Eosinophils Relative: 3 %
HCT: 33.1 % — ABNORMAL LOW (ref 39.0–52.0)
Hemoglobin: 10.3 g/dL — ABNORMAL LOW (ref 13.0–17.0)
Immature Granulocytes: 3 %
Lymphocytes Relative: 6 %
Lymphs Abs: 1.4 10*3/uL (ref 0.7–4.0)
MCH: 27.2 pg (ref 26.0–34.0)
MCHC: 31.1 g/dL (ref 30.0–36.0)
MCV: 87.6 fL (ref 80.0–100.0)
Monocytes Absolute: 1.8 10*3/uL — ABNORMAL HIGH (ref 0.1–1.0)
Monocytes Relative: 8 %
Neutro Abs: 18.4 10*3/uL — ABNORMAL HIGH (ref 1.7–7.7)
Neutrophils Relative %: 79 %
Platelets: 235 10*3/uL (ref 150–400)
RBC: 3.78 MIL/uL — ABNORMAL LOW (ref 4.22–5.81)
RDW: 14.5 % (ref 11.5–15.5)
WBC: 23.1 10*3/uL — ABNORMAL HIGH (ref 4.0–10.5)
nRBC: 0 % (ref 0.0–0.2)

## 2020-05-23 LAB — GLUCOSE, CAPILLARY
Glucose-Capillary: 176 mg/dL — ABNORMAL HIGH (ref 70–99)
Glucose-Capillary: 142 mg/dL — ABNORMAL HIGH (ref 70–99)
Glucose-Capillary: 194 mg/dL — ABNORMAL HIGH (ref 70–99)
Glucose-Capillary: 168 mg/dL — ABNORMAL HIGH (ref 70–99)
Glucose-Capillary: 189 mg/dL — ABNORMAL HIGH (ref 70–99)

## 2020-05-23 LAB — LIPASE, FLUID: Lipase-Fluid: 4 U/L

## 2020-05-23 LAB — BASIC METABOLIC PANEL
Anion gap: 9 (ref 5–15)
BUN: 21 mg/dL (ref 8–23)
CO2: 23 mmol/L (ref 22–32)
Calcium: 8.5 mg/dL — ABNORMAL LOW (ref 8.9–10.3)
Chloride: 100 mmol/L (ref 98–111)
Creatinine, Ser: 0.58 mg/dL — ABNORMAL LOW (ref 0.61–1.24)
GFR calc Af Amer: >60 mL/min (ref >60)
GFR calc non Af Amer: >60 mL/min (ref >60)
Glucose, Bld: 176 mg/dL — ABNORMAL HIGH (ref 70–99)
Potassium: 3.9 mmol/L (ref 3.5–5.1)
Sodium: 132 mmol/L — ABNORMAL LOW (ref 135–145)

## 2020-05-23 LAB — PHOSPHORUS: Phosphorus: 3.2 mg/dL (ref 2.5–4.6)

## 2020-05-23 LAB — CULTURE, BODY FLUID W GRAM STAIN -BOTTLE: Culture: NO GROWTH

## 2020-05-23 LAB — MAGNESIUM: Magnesium: 1.9 mg/dL (ref 1.7–2.4)

## 2020-05-23 LAB — TOTAL BILIRUBIN, BODY FLUID: Total bilirubin, fluid: 0.9 mg/dL

## 2020-05-23 MED ORDER — BISACODYL 5 MG PO TBEC
10.0000 mg | DELAYED_RELEASE_TABLET | Freq: Once | ORAL | Status: AC
  Administered 2020-05-23: 10 mg via ORAL
  Filled 2020-05-23: qty 2

## 2020-05-23 MED ORDER — PANTOPRAZOLE SODIUM 40 MG PO TBEC
40.0000 mg | DELAYED_RELEASE_TABLET | Freq: Two times a day (BID) | ORAL | Status: DC
  Administered 2020-05-23 – 2020-05-24 (×2): 40 mg via ORAL
  Filled 2020-05-23 (×2): qty 1

## 2020-05-23 MED ORDER — INFLUENZA VAC SPLIT QUAD 0.5 ML IM SUSY
0.5000 mL | PREFILLED_SYRINGE | INTRAMUSCULAR | Status: DC
  Filled 2020-05-23: qty 0.5

## 2020-05-23 MED ORDER — ENSURE ENLIVE PO LIQD
237.0000 mL | Freq: Two times a day (BID) | ORAL | Status: DC
  Administered 2020-05-23: 237 mL via ORAL

## 2020-05-23 MED ORDER — BISACODYL 5 MG PO TBEC: 5.0000 mg | DELAYED_RELEASE_TABLET | Freq: Every day | ORAL | Status: DC | PRN

## 2020-05-23 MED ORDER — TRAVASOL 10 % IV SOLN
INTRAVENOUS | Status: AC
  Filled 2020-05-23: qty 1320

## 2020-05-23 NOTE — Progress Notes (Signed)
Nutrition Follow-up  DOCUMENTATION CODES:   Not applicable  INTERVENTION:   -TPN management per pharmacy -Ensure Enlive po BID, each supplement provides 350 kcal and 20 grams of protein -Magic cup BID with meals, each supplement provides 290 kcal and 9 grams of protein -RD will follow for diet advancement and adjust supplement regimen as appropriate  NUTRITION DIAGNOSIS:   Inadequate oral intake related to acute illness as evidenced by NPO status.  Progressing;   GOAL:   Patient will meet greater than or equal to 90% of their needs  Met with TPN  MONITOR:   PO intake, Supplement acceptance, Labs, Weight trends, TF tolerance, I & O's  REASON FOR ASSESSMENT:   Ventilator    ASSESSMENT:   64 year old male with PMH of HTN, ERCP on 8/18 s/p biliary stent exchange for persistent postoperative bile leak after recent cholecystectomy with abscess s/- percutaneous drainage. CT abdomen/pelvis showing acute pancreatitis. Pt admitted with severe sepsis secondary to post-ERCP pancreatitis.  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   Pt remains on TPN; plan to continue at goal rate of 100 ml/hr, which provides 2342 kcals and 132 grams protein, meeting 100% of estimated kcal needs.   Medications reviewed and include miralax.   Labs reviewed: Na: 132, CBGS: 142-194 (inpatient orders for glycemic control are 0-15 units insulin aspart every 8 hours).   Diet Order:   Diet Order            Diet full liquid Room service appropriate? Yes; Fluid consistency: Thin  Diet effective now                 EDUCATION NEEDS:   Education needs have been addressed  Skin:  Skin Assessment: Skin Integrity Issues: Skin Integrity Issues:: Incisions Incisions: closed abdomen  Last BM:  05/22/20  Height:   Ht Readings from Last 1 Encounters:  05/02/20 _0  (1.753 m)    Weight:   Wt Readings from Last 1 Encounters:  05/23/20 85.9 kg    Ideal Body Weight:  72.7 kg  BMI:  Body mass index is 27.97 kg/m.  Estimated Nutritional Needs:   Kcal:  0814-4818  Protein:  130-145 grams  Fluid:  > 2.3 L    Loistine Chance, RD, LDN, [place] Registered Dietitian II Certified Diabetes Care and Education Specialist Please refer to [facility] for RD and/or RD on-call/weekend/after hours pager

## 2020-05-23 NOTE — Plan of Care (Signed)
  Problem: Education: Goal: Knowledge of General Education information will improve Description: Including pain rating scale, medication(s)/side effects and non-pharmacologic comfort measures Outcome: Progressing   Problem: Health Behavior/Discharge Planning: Goal: Ability to manage health-related needs will improve Outcome: Progressing   Problem: Clinical Measurements: Goal: Will remain free from infection Outcome: Progressing   Problem: Activity: Goal: Risk for activity intolerance will decrease Outcome: Progressing   Problem: Nutrition: Goal: Adequate nutrition will be maintained Outcome: Progressing   Problem: Coping: Goal: Level of anxiety will decrease Outcome: Progressing   Problem: Pain Managment: Goal: General experience of comfort will improve Outcome: Progressing   

## 2020-05-23 NOTE — Progress Notes (Addendum)
PHARMACY - TOTAL PARENTERAL NUTRITION CONSULT NOTE   Indication: severe pancreatitis  Patient Measurements: Height: _0  (175.3 cm) Weight: 85.9 kg (189 lb 6 oz) IBW/kg (Calculated) : 70.7 TPN AdjBW (KG): 86.1 Body mass index is 27.97 kg/m.  Assessment: Admitted 04/26/20. Post-ERCP 8/18 and biliary stent exchange for persistent postoperative bile leak related to recent cholecystectomy with abscess status post percutaneous drainage. Developed acute pancreatitis.  - Prolonged hospitalization complicated by VDRF (extubated 8/24), AKI, encephalopathy, and afib. - 9/11: Still with very poor oral intake. Start TPN for Acute post ERCP pancreatitis/ ileus and persistent biliary leak. Significant gastric fluid retention from functional outlet obstruction. NG to suction   Glucose / Insulin: A1C 5.6. CBGs <180. 10 units mSSI used in 24 hours Electrolytes: Na 132 low. AdjCa 10.4 high. (on Tums TID); K 3.9 (ileus goal >/= 4) Mg 1.9 (ileus goal >/=2)  Renal: Scr <1.  LFTs / TGs: 9/14- AST/ALT wnl. Alk phos 276; TG 146 Prealbumin / albumin: 9/13- Prealbumin 7.2. Albumin 1.6 Intake / Output; MIVF: No U/O recorded. LBM 05/17/20. NG 616m. Drains 48out/24hr. I/O -1.0L (per chart, incomplete documentation)   GI Imaging: 9/5: CT with signs of sequela of severe pancreatitis 9/7: HIDA with persistent bile leak with accumulations and paracolic gutter. 9/9: CT: pancreatitis (peripancreatic fat stranding and peripancreatic fluid, percutaneous drain at the gallbladder fossa, unchanged biliary stent with no intrahepatic biliary duct dilatation, bilateral pleural effusions.   Surgeries / Procedures:  8/18 ERCP and biliary stent exchange 9/9: Unsuccessful ERCP 9/10: Paracentesis 1.4L out   GI Meds: Acidophilus 2 daily, Phenergan 12.577mIV q4h PRN (last dose 9/6), IV PPI/12h, Miralax daily  Central access: PICC placed 9/11. TPN start date: 9/11  Nutritional Goals (per RD recommendation on 05/18/20): kCal:  2300-2500, Protein: 130-145, Fluid: >2.3L Goal TPN rate is 100 mL/hr (provides 132 g of protein and 2342 kcals per day)  Current Nutrition: CLD 9/11 po intake 370 of CLD started in PM>>9/13 eating ~10% per RN   Plan:  Continue TPN with rate of 10063mr at 1800 (provides 132g AA, 67g lipids, 336g dextrose, and 2342kcal) Electrolytes in TPN: increase to 145m37m of Na, increase 40 mEq/L of K, continue 0mEq19mof Calcium, continue 8mEq/55mf Mg and 10mmol70mf Phos. Cl:Ac ratio 1:1 Add standard MVI and trace elements to TPN Continue Moderate SSI q8h and adjust as needed  Monitor Daily TPN labs for now; start cycling when starting diet  Change meds to po (MVI, Phenergan, PPI) when tolerating diet reliably  Sarah RRedgie Grayeracy Student  I discussed / reviewed the pharmacy note by Dr. RickettWalden Fieldagree with the student's findings and plans as documented.  Oneita Allmon CBenetta SparD, BCPS, BCCP Clinical Pharmacist  Please check AMION for all [facility] Ph[facility]numbers After 10:00 PM, call Main [phone]

## 2020-05-23 NOTE — Progress Notes (Addendum)
Daily Rounding Note  05/23/2020, 11:41 AM  LOS: 26 days   SUBJECTIVE:   Chief complaint: bile leak.  Pancreatitis.  GOO   GB fossa drain output: 600, 450 ml previous 2 d. 300 mL so far today.   6 mg Dilaudid, 20 mg Oxycodone yesterday.  All this for pain in lower back, not for abdominal pain that has resoljved Large BM at [facility] today, first he has had in "2 weeks".   Abdomen is bloated but no abd pain.  No nausea.  Tolerating clears. SOB better.     OBJECTIVE:         Vital signs in last 24 hours:    Temp:  [98.2 F (36.8 C)-99.1 F (37.3 C)] 98.6 F (37 C) (09/15 0741) Pulse Rate:  [91-106] 106 (09/15 0741) Resp:  [17-18] 17 (09/15 0741) BP: (121-136)/(80-87) 126/80 (09/15 0741) SpO2:  [88 %-99 %] 90 % (09/15 0741) Weight:  [85.9 kg] 85.9 kg (09/15 0457) Last BM Date: 05/22/20 Filed Weights   05/13/20 0306 05/18/20 0500 05/23/20 0457  Weight: 86 kg 84.6 kg 85.9 kg   General: lookds better but still unwell.  comfortable   Heart: RRR Chest: clear bil.  No SOB Abdomen: protuberant, moderately tense.  NT.  BS hypoactive  Extremities: no CCE Neuro/Psych:  Oriented x 3.    Intake/Output from previous day: 09/14 0701 - 09/15 0700 In: 3831.9 [P.O.:1440; I.V.:2391.9] Out: 450 [Drains:450]  Intake/Output this shift: Total I/O In: -  Out: 300 [Drains:300]  Lab Results: Recent Labs    05/21/20 0418 05/22/20 1337 05/23/20 0455  WBC 22.5* 28.8* 23.1*  HGB 10.3* 11.1* 10.3*  HCT 33.3* 34.7* 33.1*  PLT 229 278 235   BMET Recent Labs    05/21/20 0418 05/22/20 0424 05/23/20 0455  NA 132* 133* 132*  K 3.6 3.9 3.9  CL 99 100 100  CO2 24 24 23   GLUCOSE 182* 171* 176*  BUN 15 18 21   CREATININE 0.68 0.65 0.58*  CALCIUM 8.4* 8.6* 8.5*   LFT Recent Labs    05/21/20 0418 05/22/20 0424  PROT 5.2* 5.4*  ALBUMIN 1.6* 1.6*  AST 17 20  ALT 18 16  ALKPHOS 300* 276*  BILITOT 0.7 0.6  BILIDIR  --  0.2    IBILI  --  0.4   PT/INR No results for input(s): LABPROT, INR in the last 72 hours. Hepatitis Panel No results for input(s): HEPBSAG, HCVAB, HEPAIGM, HEPBIGM in the last 72 hours.  Studies/Results: DG Chest 1 View  Result Date: 05/21/2020 CLINICAL DATA:  Dyspnea. EXAM: CHEST  1 VIEW COMPARISON:  CT abdomen pelvis dated May 17, 2020. Chest x-ray dated May 03, 2020. FINDINGS: Enteric tube in the stomach. Right-sided PICC line with tip at the cavoatrial junction. The heart size and mediastinal contours are within normal limits. Left greater than right bibasilar opacities persist. Unchanged small left pleural effusion. No pneumothorax. Unchanged cholecystostomy tube in the right upper quadrant. No acute osseous abnormality. IMPRESSION: 1. Unchanged bibasilar pneumonia and small left pleural effusion. Electronically Signed   By: Titus Dubin M.D.   On: 05/21/2020 14:04   DG Abd 1 View  Result Date: 05/23/2020 CLINICAL DATA:  Abdominal pain EXAM: ABDOMEN - 1 VIEW COMPARISON:  May 16, 2020; CT abdomen and pelvis May 17, 2020 FINDINGS: Pigtail catheter remains on the right laterally. Biliary stent in place. There is moderate air in the stomach without gross gastric distention. There is diffuse stool  in the colon. There is no small dilatation. No air-fluid level. No free air evident on supine examination. IMPRESSION: Pigtail catheter on the right laterally. Biliary stent in place. Diffuse stool in colon. No bowel obstruction or free air. Electronically Signed   By: Lowella Grip III M.D.   On: 05/23/2020 10:18    Scheduled Meds: . acetaminophen  650 mg Oral Q6H  . acidophilus  2 capsule Oral Daily  . Chlorhexidine Gluconate Cloth  6 each Topical Daily  . enoxaparin (LOVENOX) injection  40 mg Subcutaneous Q24H  . [START ON 05/24/2020] influenza vac split quadrivalent PF  0.5 mL Intramuscular Tomorrow-1000  . insulin aspart  0-15 Units Subcutaneous Q8H  . mouth rinse  15 mL  Mouth Rinse BID  . oxyCODONE  10 mg Oral Q12H  . pantoprazole  40 mg Oral BID  . polyethylene glycol  17 g Oral Daily  . pravastatin  20 mg Oral q1800  . sertraline  100 mg Oral Daily  . sodium chloride flush  10-40 mL Intracatheter Q12H  . traZODone  50 mg Oral QHS   Continuous Infusions: . sodium chloride Stopped (05/07/20 0923)  . TPN ADULT (ION)     PRN Meds:.HYDROmorphone (DILAUDID) injection, ipratropium-albuterol, lidocaine, metoprolol tartrate, oxyCODONE, polyvinyl alcohol, promethazine, sodium chloride flush, sodium chloride flush   ASSESMENT:   *  Persistent post chole bile leak despite bile duct stenting.  Long term GB fossa drain in place.      *   Post ERCP pancreatitis, severe  *    GOO due to pancreatitis.  NGT came out 9/14.  KUB this AM with diffuse colonic stool, no FA, no obstruction.   On TNA, bid Protonix.       *   BB PNA, Left pleural effusion.  WBCs 22.5 yesterday.      *   Stable Lamar anemia.  *   Hyponatremia.      *   Low back pain.    PLAN   *   Advance to [facility] diet.    *   Dulcolax now.    *   Re low back pain, may want to try other strategies for managing this.   ? Cut back/cut out Dilaudid?,  ? add standing doses of ES Tylenol to try to dull baselline pain which might reduce need for prn narcotics?   ? Add antispasmodic?  Trying to prevent narcotic dependency.       Azucena Freed PA-C   Attending physician's note   I have taken an interval history, reviewed the chart and examined the patient. I agree with the Advanced Practitioner's note, impression and recommendations.    Tolerating clear liquids, advance to full liquid diet  Pain management as per primary team, consider tapering down slowly narcotics  Bowel regimen    I have spent 15 minutes of patient care (this includes precharting, chart review, review of results, face-to-face time used for counseling as well as treatment plan and follow-up. The patient was provided an  opportunity to ask questions and all were answered. The patient agreed with the plan and demonstrated an understanding of the instructions.  Damaris Hippo , MD [phone]

## 2020-05-23 NOTE — Progress Notes (Signed)
Referring Physician(s): Arrien,M  Supervising Physician: Suttle,D  Patient Status:  [facility] - In-pt  Chief Complaint:  Abdominal pain/bile leak   Subjective: Pt doing ok this am; denies worsening abd pain, N/V; has some soreness at RUQ drain site; abd still dist; just had abd film done results pend   Allergies: Patient has no known allergies.  Medications: Prior to Admission medications   Medication Sig Start Date End Date Taking? Authorizing Provider  Calcium Carb-Cholecalciferol (CALCIUM+D3 PO) Take 1 tablet by mouth daily.   Yes [provider]  Coenzyme Q10 (COQ10) 400 MG CAPS Take 800 mg by mouth in the morning.    Yes [provider]  Cyanocobalamin (VITAMIN B-12) 5000 MCG TBDP Take 5,000 mcg by mouth daily.   Yes [provider]  GLUCOSAMINE-CHONDROITIN PO Take 1 tablet by mouth in the morning and at bedtime.    Yes [provider]  Javier Docker Oil 1000 MG CAPS Take 1,000 mg by mouth in the morning and at bedtime.    Yes [provider]  lisinopril-hydrochlorothiazide (ZESTORETIC) 10-12.5 MG tablet Take 1 tablet by mouth daily. 03/04/20  Yes [provider]  Moringa Oleifera (MORINGA PO) Take 1,000 mg by mouth 2 (two) times daily.   Yes [provider]  Multiple Vitamin (MULTIVITAMIN WITH MINERALS) TABS tablet Take 1 tablet by mouth daily.   Yes [provider]  ondansetron (ZOFRAN) 4 MG tablet Take 1 tablet (4 mg total) by mouth every 8 (eight) hours as needed for nausea or vomiting. 04/25/20  Yes Mansouraty, Telford Nab., MD  OVER THE COUNTER MEDICATION Take 1 tablet by mouth See admin instructions. [facility] otc tablet- Take 1 tablet by mouth once a day   Yes [provider]  oxyCODONE (OXY IR/ROXICODONE) 5 MG immediate release tablet Take 5 mg by mouth 2 (two) times daily as needed (for pain).  04/25/20  Yes [provider]  pravastatin (PRAVACHOL) 80 MG tablet Take 40 mg by mouth in the  morning and at bedtime.   Yes [provider]  Red Yeast Rice 600 MG CAPS Take 1,200 mg by mouth at bedtime.    Yes [provider]  sertraline (ZOLOFT) 100 MG tablet Take 100 mg by mouth daily. 11/08/19  Yes [provider]  TURMERIC PO Take 1,500 mg by mouth 2 (two) times daily.   Yes [provider]  zolpidem (AMBIEN) 5 MG tablet Take 5 mg by mouth at bedtime. 12/29/19  Yes [provider]  aspirin EC 81 MG tablet Take 81 mg by mouth 2 (two) times daily. Patient not taking: Reported on 04/26/2020    [provider]  Calcium Carb-Cholecalciferol (CALCIUM 1000 + D PO) Take 1 tablet by mouth daily. Patient not taking: Reported on 04/26/2020    [provider]  ibuprofen (ADVIL) 200 MG tablet Take 400-600 mg by mouth every 6 (six) hours as needed for headache or moderate pain.    [provider]     Vital Signs: BP 126/80 (BP Location: Left Arm)   Pulse (!) 106   Temp 98.6 F (37 C) (Oral)   Resp 17   Ht 5\' 9"  (1.753 m)   Wt 189 lb 6 oz (85.9 kg)   SpO2 90%   BMI 27.97 kg/m   Physical Exam awake/alert; RUQ drain intact but only gauze dressing holding it in place; OP 450 cc yesterday, 300+ cc today green bile; drain flushed gently  Imaging: DG Chest 1 View  Result  Date: 05/21/2020 CLINICAL DATA:  Dyspnea. EXAM: CHEST  1 VIEW COMPARISON:  CT abdomen pelvis dated May 17, 2020. Chest x-ray dated May 03, 2020. FINDINGS: Enteric tube in the stomach. Right-sided PICC line with tip at the cavoatrial junction. The heart size and mediastinal contours are within normal limits. Left greater than right bibasilar opacities persist. Unchanged small left pleural effusion. No pneumothorax. Unchanged cholecystostomy tube in the right upper quadrant. No acute osseous abnormality. IMPRESSION: 1. Unchanged bibasilar pneumonia and small left pleural effusion. Electronically Signed   By: Titus Dubin M.D.   On: 05/21/2020 14:04     Labs:  CBC: Recent Labs    05/20/20 1834 05/21/20 0418 05/22/20 1337 05/23/20 0455  WBC 26.4* 22.5* 28.8* 23.1*  HGB 10.8* 10.3* 11.1* 10.3*  HCT 34.3* 33.3* 34.7* 33.1*  PLT 233 229 278 235    COAGS: Recent Labs    02/02/20 1902 02/02/20 2115 05/16/20 1905  INR 1.2 1.1 1.3*    BMP: Recent Labs    05/20/20 0314 05/21/20 0418 05/22/20 0424 05/23/20 0455  NA 131* 132* 133* 132*  K 3.7 3.6 3.9 3.9  CL 97* 99 100 100  CO2 26 24 24 23   GLUCOSE 135* 182* 171* 176*  BUN 13 15 18 21   CALCIUM 8.1* 8.4* 8.6* 8.5*  CREATININE 0.70 0.68 0.65 0.58*  GFRNONAA >60 >60 >60 >60  GFRAA >60 >60 >60 >60    LIVER FUNCTION TESTS: Recent Labs    05/18/20 0513 05/19/20 1214 05/21/20 0418 05/22/20 0424  BILITOT 1.3* 1.1 0.7 0.6  AST 51* 24 17 20   ALT 45* 29 18 16   ALKPHOS 753* 500* 300* 276*  PROT 5.2* 5.5* 5.2* 5.4*  ALBUMIN 1.9* 1.9* 1.6* 1.6*    Assessment and Plan: Pt with hx bile leak post cholecystectomy and GB fossa drain placement on 02/13/20; latest cholangiogram on 9/10 with well positioned drain and with near resolution of fistulous comm with cystic duct; pt s/p paracentesis on 9/10 as well; afebrile; WBC 23.1(28.8), hgb 10.3, creat 0.58; ascitic fl cx neg to date; prev drain fl cx- e coli; cont to monitor drain OP closely; check abd film results; other plans as per GI   Electronically Signed: D. Rowe Robert, PA-C 05/23/2020, 9:47 AM   I spent a total of 15 minutes at the the patient's bedside AND on the patient's hospital floor or unit, greater than 50% of which was counseling/coordinating care for gallbladder fossa drain    Patient ID: Adam Fowler, male   DOB: [birth year], 64 y.o.   MRN: [id number]

## 2020-05-23 NOTE — Progress Notes (Signed)
PROGRESS NOTE    Adam Fowler  CHE:[id number] DOB: 02-15-1956 DOA: 04/26/2020 PCP: Melony Overly, MD    Brief Narrative:  Adam Fowler is a 64 year old male with past medical history notable for essential hypertension who underwent ERCP on 04/25/2020 for biliary stent exchange for persistent postoperative bile leak related to recent cholecystectomy with abscess status post percutaneous drain in which he developed severe epigastric abdominal pain associate with nausea, vomiting.  CT abdomen/pelvis notable for changes consistent with acute pancreatitis without abscess or drainable fluid collection likely related to postoperative ERCP pancreatitis.  8/21-8/24: Rapid response for increased work of breathing and atrial fibrillation requiring invasive mechanical ventilation.  Treated for volume overload and aspiration pneumonia with antibiotic therapy with Zosyn, liberated from mechanical ventilation on 05/01/2020 and transition to nasal cannula.  8/25-9/3: Continues with leukocytosis, abdominal pain with repeat CT abdomen/pelvis with markedly diffuse pancreatic inflammation with fat stranding and free fluid.  Zosyn changed to meropenem on 8/27 and placed on tube feeds via NG tube for several days.  9/3-9/7: NG tube removed, patient was tolerating oral nutritional support.  But continue with high output biliary drain despite stent in appropriate location and stable liver enzymes.  Repeat CT abdomen/pelvis 05/13/2020 with signs/sequela of severe pancreatitis and stable positioning of internal biliary stents with no intrahepatic biliary duct dilation.  HIDA scan 05/15/2020 with persistent bile leak accumulating the lateral aspect of the right upper quadrant, paracolic gutter, patient with poor oral intake.  9/9: Unsuccessful ERCP, unable to pass beyond D1/D2 sweep, patient with significant gastric fluid retention related to functional outlet obstruction, NG tube reinserted and placed on intermittent suction.   Repeat abdomen/pelvis 9/9 with pancreatitis, pain peripancreatic fat stranding and peripancreatic fluid, percutaneous drain at the gallbladder fossa, unchanged blurry stent with no intrahepatic bladder dilation, bilateral pleural effusions.  9/10: PICC line placed for initiation of TPN.  9/14: NG tube removed accidentally by patient.  Had a bowel movement overnight.  9/15: Abdominal pain improved, NG tube removed overnight accidentally.  Had a bowel movement.  GI advancing to full liquid diet.   Assessment & Plan:   Principal Problem:   Post-ERCP acute pancreatitis Active Problems:   Severe sepsis (HCC)   Polycythemia   AKI (acute kidney injury) ([place])   Metabolic acidosis   Acute respiratory failure with hypoxia (HCC)   Bilateral pleural effusion   Ileus (HCC)   Postoperative bile leak   Gastric outlet obstruction   Acute post ERCP pancreatitis/ileus Bile leak following cholecystectomy, persistent Patient presenting following increased abdominal pain following ERCP.  Lipase 1379.  CT abdomen/pelvis notable for pancreatitis.  Previous cholecystectomy with bile leak that has persisted despite bile duct stenting.  Gallbladder fossa drain in place placed by IR on 02/13/2020. --[facility]/IR following, appreciate assistance --General surgery signed off 05/18/2020, no surgical intervention required at this time --NG tube pulled out on 05/22/2020, diet advanced to full liquids today by GI --Continue TPN with pharmacy monitoring/dosing --Continue to monitor gallbladder fossa drain output, 450 mL out past 24 hours  Acute hypoxic respiratory failure: Resolved Bilateral pleural effusions On 04/28/2020, patient developed severe respiratory distress, rapid response patient was transferred to the ICU with mechanical ventilation.  Etiology volume overload with aspiration pneumonia.  Was successfully extubated on 05/01/2020.  Completed course of antibiotics with vancomycin, Zosyn and meropenem.   Supplemental oxygen has been titrated off and oxygenating well on room air. --Supportive care  Acute renal failure: Resolved Patient presenting with creatinine 1.41.  Etiology likely poor oral  intake coupled with acute pancreatitis as above.  Patient was treated with IV fluid hydration, now supported with TPN with further advancement of diet.  Now resolved with creatinine 0.58.  Hypokalemia Hypomagnesemia Repleted, currently on TPN.  Potassium 3.9, magnesium 1.9 today. --Continue to follow electrolytes closely daily  Paroxysmal atrial fibrillation, transient: Resolved Spontaneously converted to normal sinus rhythm, will CHA2DS2-VASc score = 1, low risk of thrombosis.  Telemetry has been discontinued.  Chronic pain syndrome: On oxycodone 10 mg every 4 hours as needed for pain at home. --Tylenol 650 mg every 6 hours scheduled --OxyContin 10 mg p.o. every 12 hours --Lidocaine patch --OxyIR, 5 mg every 4 hours as needed for moderate breakthrough pain; last used 9/10 at 0600 --Dilaudid 1 mg IV every 3 hours as needed severe breakthrough pain, continues to use fairly regularly 6 doses on 9/14 --Will need to slowly titrate down narcotic burden  Anemia of chronic disease Etiology likely secondary to malnutrition.  Hemoglobin 10.3, stable. --Continue to monitor hemoglobin, transfuse for hemoglobin less than 7.0.  Essential hypertension On lisinopril and HCTZ at home.  BP 126/80. --Continue to hold home antihypertensives for now  Dyslipidemia: Continue pravastatin  Hyperglycemia Hemoglobin A1c 5.6. --Continue insulin therapy per TPN protocol  Moderate calorie protein malnutrition Nutrition Status: Nutrition Problem: Inadequate oral intake Etiology: acute illness Signs/Symptoms: NPO status Interventions: TPN, Ensure Enlive (each supplement provides 350kcal and 20 grams of protein) --Continue TPN, pharmacy consulted for assistance dosing/monitoring --Clear liquid diet now transition  to full liquid diet per GI today  Depression/anxiety: Continue sertraline and trazodone   DVT prophylaxis: Lovenox Code Status: Full code Family Communication: Updated patient extensively at bedside  Disposition Plan:  Status is: Inpatient  Remains inpatient appropriate because:Ongoing active pain requiring inpatient pain management, Ongoing diagnostic testing needed not appropriate for outpatient work up, Unsafe d/c plan, IV treatments appropriate due to intensity of illness or inability to take PO and Inpatient level of care appropriate due to severity of illness   Dispo:  Patient From:    Planned Disposition: Home  Expected discharge date: 05/28/20  Medically stable for discharge:      Consultants:   [facility]  Interventional radiology  General surgery -signed off 05/18/2020  Procedures:   ERCP 9/9  Antimicrobials:   Zosyn 8/19-8/26  Meropenem 8/27-9/11  Vancomycin 8/22-8/23   Subjective: Patient seen and examined bedside, resting comfortably.  Reports bowel movement overnight.  Abdominal pain improved.  Complaining of continuous beeping from IV machine.  No other complaints or concerns at this time.  Denies headache, no visual changes, no chest pain, no palpitations, no nausea/vomiting, no diarrhea, no fever/chills/night sweats, no weakness, no fatigue.  No acute events overnight per nursing staff.  Objective: Vitals:   05/23/20 0457 05/23/20 0537 05/23/20 0741 05/23/20 1436  BP:  121/80 126/80 125/76  Pulse:  (!) 104 (!) 106 99  Resp:  17 17 17   Temp:  99.1 F (37.3 C) 98.6 F (37 C) 98.5 F (36.9 C)  TempSrc:  Oral Oral Oral  SpO2:  (!) 88% 90% 93%  Weight: 85.9 kg     Height:        Intake/Output Summary (Last 24 hours) at 05/23/2020 1558 Last data filed at 05/23/2020 0735 Gross per 24 hour  Intake 2871.88 ml  Output 550 ml  Net 2321.88 ml   Filed Weights   05/13/20 0306 05/18/20 0500 05/23/20 0457  Weight: 86 kg 84.6 kg 85.9 kg     Examination:  General exam:  Appears calm and comfortable, chronically ill appearance Respiratory system: Clear to auscultation. Respiratory effort normal.  Oxygenating well on room air Cardiovascular system: S1 & S2 heard, RRR. No JVD, murmurs, rubs, gallops or clicks. No pedal edema. Gastrointestinal system: Abdomen is distended, soft and nontender. No organomegaly or masses felt.  Faint bowel sounds appreciated Central nervous system: Alert and oriented. No focal neurological deficits. Extremities: Symmetric 5 x 5 power. Skin: No rashes, lesions or ulcers Psychiatry: Judgement and insight appear normal. Mood & affect appropriate.     Data Reviewed: I have personally reviewed following labs and imaging studies  CBC: Recent Labs  Lab 05/18/20 0513 05/18/20 0513 05/19/20 1214 05/20/20 1834 05/21/20 0418 05/22/20 1337 05/23/20 0455  WBC 31.3*   < > 22.4* 26.4* 22.5* 28.8* 23.1*  NEUTROABS 29.5*  --  19.8*  --  19.3* 24.1* 18.4*  HGB 11.5*   < > 11.8* 10.8* 10.3* 11.1* 10.3*  HCT 35.6*   < > 37.6* 34.3* 33.3* 34.7* 33.1*  MCV 87.5   < > 88.7 86.8 89.0 87.6 87.6  PLT 332   < > 306 233 229 278 235   < > = values in this interval not displayed.   Basic Metabolic Panel: Recent Labs  Lab 05/19/20 1214 05/20/20 0314 05/21/20 0418 05/22/20 0424 05/23/20 0455  NA 132* 131* 132* 133* 132*  K 3.8 3.7 3.6 3.9 3.9  CL 97* 97* 99 100 100  CO2 23 26 24 24 23   GLUCOSE 131* 135* 182* 171* 176*  BUN 15 13 15 18 21   CREATININE 0.84 0.70 0.68 0.65 0.58*  CALCIUM 8.4* 8.1* 8.4* 8.6* 8.5*  MG 1.8 2.0 1.9 1.8 1.9  PHOS 3.4 2.4* 3.0 3.2 3.2   GFR: Estimated Creatinine Clearance: 101.3 mL/min (A) (by C-G formula based on SCr of 0.58 mg/dL (L)). Liver Function Tests: Recent Labs  Lab 05/17/20 1812 05/18/20 0513 05/19/20 1214 05/21/20 0418 05/22/20 0424  AST 88* 51* 24 17 20   ALT 60* 45* 29 18 16   ALKPHOS 1,011* 753* 500* 300* 276*  BILITOT 2.2* 1.3* 1.1 0.7 0.6  PROT 5.6*  5.2* 5.5* 5.2* 5.4*  ALBUMIN 2.0* 1.9* 1.9* 1.6* 1.6*   Recent Labs  Lab 05/16/20 1905  LIPASE 27   No results for input(s): AMMONIA in the last 168 hours. Coagulation Profile: Recent Labs  Lab 05/16/20 1905  INR 1.3*   Cardiac Enzymes: No results for input(s): CKTOTAL, CKMB, CKMBINDEX, TROPONINI in the last 168 hours. BNP (last 3 results) No results for input(s): PROBNP in the last 8760 hours. HbA1C: No results for input(s): HGBA1C in the last 72 hours. CBG: Recent Labs  Lab 05/22/20 2056 05/22/20 2258 05/23/20 0436 05/23/20 0808 05/23/20 1159  GLUCAP 168* 171* 176* 142* 194*   Lipid Profile: Recent Labs    05/21/20 0419  TRIG 146   Thyroid Function Tests: No results for input(s): TSH, T4TOTAL, FREET4, T3FREE, THYROIDAB in the last 72 hours. Anemia Panel: No results for input(s): VITAMINB12, FOLATE, FERRITIN, TIBC, IRON, RETICCTPCT in the last 72 hours. Sepsis Labs: No results for input(s): PROCALCITON, LATICACIDVEN in the last 168 hours.  Recent Results (from the past 240 hour(s))  Culture, body fluid-bottle     Status: None   Collection Time: 05/18/20  5:13 PM   Specimen: Fluid  Result Value Ref Range Status   Specimen Description FLUID PERITONEAL  Final   Special Requests BOTTLES DRAWN AEROBIC AND ANAEROBIC  Final   Culture   Final  NO GROWTH 5 DAYS Performed at [facility], [place] [address] [place], [place] [address]    Report Status 05/23/2020 FINAL  Final  Gram stain     Status: None   Collection Time: 05/18/20  5:13 PM   Specimen: Fluid  Result Value Ref Range Status   Specimen Description FLUID PERITONEAL  Final   Special Requests NONE  Final   Gram Stain   Final    WBC PRESENT, PREDOMINANTLY MONONUCLEAR NO ORGANISMS SEEN CYTOSPIN SMEAR Performed at [facility], [place] [address] [place], [place] [address]    Report Status 05/19/2020 FINAL  Final         Radiology Studies: DG Abd 1 View  Result Date:  05/23/2020 CLINICAL DATA:  Abdominal pain EXAM: ABDOMEN - 1 VIEW COMPARISON:  May 16, 2020; CT abdomen and pelvis May 17, 2020 FINDINGS: Pigtail catheter remains on the right laterally. Biliary stent in place. There is moderate air in the stomach without gross gastric distention. There is diffuse stool in the colon. There is no small dilatation. No air-fluid level. No free air evident on supine examination. IMPRESSION: Pigtail catheter on the right laterally. Biliary stent in place. Diffuse stool in colon. No bowel obstruction or free air. Electronically Signed   By: Lowella Grip III M.D.   On: 05/23/2020 10:18        Scheduled Meds: . acetaminophen  650 mg Oral Q6H  . acidophilus  2 capsule Oral Daily  . Chlorhexidine Gluconate Cloth  6 each Topical Daily  . enoxaparin (LOVENOX) injection  40 mg Subcutaneous Q24H  . feeding supplement (ENSURE ENLIVE)  237 mL Oral BID BM  . [START ON 05/24/2020] influenza vac split quadrivalent PF  0.5 mL Intramuscular Tomorrow-1000  . insulin aspart  0-15 Units Subcutaneous Q8H  . mouth rinse  15 mL Mouth Rinse BID  . oxyCODONE  10 mg Oral Q12H  . pantoprazole  40 mg Oral BID  . polyethylene glycol  17 g Oral Daily  . pravastatin  20 mg Oral q1800  . sertraline  100 mg Oral Daily  . sodium chloride flush  10-40 mL Intracatheter Q12H  . traZODone  50 mg Oral QHS   Continuous Infusions: . sodium chloride Stopped (05/07/20 0923)  . TPN ADULT (ION)       LOS: 26 days    Time spent: 39 minutes spent on chart review, discussion with nursing staff, consultants, updating family and interview/physical exam; more than 50% of that time was spent in counseling and/or coordination of care.    Altie Savard J [place] DO [facility] Available via Epic secure chat 7am-7pm After these hours, please refer to coverage provider listed on [email or web address] 05/23/2020, 3:58 PM

## 2020-05-24 ENCOUNTER — Inpatient Hospital Stay (HOSPITAL_COMMUNITY): Payer: 59

## 2020-05-24 ENCOUNTER — Other Ambulatory Visit (HOSPITAL_COMMUNITY): Payer: 59

## 2020-05-24 DIAGNOSIS — R140 Abdominal distension (gaseous): Secondary | ICD-10-CM

## 2020-05-24 DIAGNOSIS — T8189XA Other complications of procedures, not elsewhere classified, initial encounter: Secondary | ICD-10-CM

## 2020-05-24 DIAGNOSIS — J80 Acute respiratory distress syndrome: Secondary | ICD-10-CM

## 2020-05-24 LAB — GLUCOSE, CAPILLARY
Glucose-Capillary: 181 mg/dL — ABNORMAL HIGH (ref 70–99)
Glucose-Capillary: 182 mg/dL — ABNORMAL HIGH (ref 70–99)
Glucose-Capillary: 185 mg/dL — ABNORMAL HIGH (ref 70–99)
Glucose-Capillary: 189 mg/dL — ABNORMAL HIGH (ref 70–99)
Glucose-Capillary: 196 mg/dL — ABNORMAL HIGH (ref 70–99)
Glucose-Capillary: 201 mg/dL — ABNORMAL HIGH (ref 70–99)

## 2020-05-24 LAB — COMPREHENSIVE METABOLIC PANEL
ALT: 17 U/L (ref 0–44)
AST: 20 U/L (ref 15–41)
Albumin: 1.6 g/dL — ABNORMAL LOW (ref 3.5–5.0)
Alkaline Phosphatase: 220 U/L — ABNORMAL HIGH (ref 38–126)
Anion gap: 7 (ref 5–15)
BUN: 20 mg/dL (ref 8–23)
CO2: 24 mmol/L (ref 22–32)
Calcium: 8.6 mg/dL — ABNORMAL LOW (ref 8.9–10.3)
Chloride: 104 mmol/L (ref 98–111)
Creatinine, Ser: 0.66 mg/dL (ref 0.61–1.24)
GFR calc Af Amer: >60 mL/min (ref >60)
GFR calc non Af Amer: >60 mL/min (ref >60)
Glucose, Bld: 209 mg/dL — ABNORMAL HIGH (ref 70–99)
Potassium: 3.9 mmol/L (ref 3.5–5.1)
Sodium: 135 mmol/L (ref 135–145)
Total Bilirubin: 0.7 mg/dL (ref 0.3–1.2)
Total Protein: 5.7 g/dL — ABNORMAL LOW (ref 6.5–8.1)

## 2020-05-24 LAB — PHOSPHORUS: Phosphorus: 3.7 mg/dL (ref 2.5–4.6)

## 2020-05-24 LAB — MAGNESIUM: Magnesium: 2 mg/dL (ref 1.7–2.4)

## 2020-05-24 MED ORDER — PANTOPRAZOLE SODIUM 40 MG PO TBEC
40.0000 mg | DELAYED_RELEASE_TABLET | Freq: Every day | ORAL | Status: DC
  Administered 2020-05-25 – 2020-05-27 (×3): 40 mg via ORAL
  Filled 2020-05-24 (×3): qty 1

## 2020-05-24 MED ORDER — TRAVASOL 10 % IV SOLN
INTRAVENOUS | Status: AC
  Filled 2020-05-24: qty 1320

## 2020-05-24 NOTE — Progress Notes (Signed)
SATURATION QUALIFICATIONS: (This note is used to comply with regulatory documentation for home oxygen)  Patient Saturations on Room Air at Rest = 93%  Patient Saturations on Room Air while Ambulating = 85%  Patient Saturations on 2 Liters of oxygen while Ambulating = 92%  Please briefly explain why patient needs home oxygen: desat with activity.   Adam Fowler Acute Rehabilitation Services Pager [phone] Office [phone]

## 2020-05-24 NOTE — Progress Notes (Addendum)
PHARMACY - TOTAL PARENTERAL NUTRITION CONSULT NOTE   Indication: severe pancreatitis  Patient Measurements: Height: 5' 9"  (175.3 cm) Weight: 86.3 kg (190 lb 4.1 oz) IBW/kg (Calculated) : 70.7 TPN AdjBW (KG): 86.1 Body mass index is 28.1 kg/m.  Assessment: Admitted 04/26/20. Post-ERCP 8/18 and biliary stent exchange for persistent postoperative bile leak related to recent cholecystectomy with abscess status post percutaneous drainage. Developed acute pancreatitis.  - Prolonged hospitalization complicated by VDRF (extubated 8/24), AKI, encephalopathy, and afib. - 9/11: Still with very poor oral intake. Start TPN for Acute post ERCP pancreatitis/ ileus and persistent biliary leak. Significant gastric fluid retention from functional outlet obstruction. NG to suction  9/16 advance to FLD, will follow up on PO intake  Glucose / Insulin: A1C 5.6. CBGs 176-209. 9 units mSSI used in 24 hours Electrolytes: Na 135, K 3.9, Cl 104, CO2 24, AdjCa 10.5 high. Phos 3.7, Mg 2.0  Renal: Scr <1. BUN wnl LFTs / TGs: AST/ALT wnl. Alk phos 220 down trending. TG 146 Prealbumin / albumin: 9/13- Prealbumin 7.2. Albumin 1.6 Intake / Output; MIVF: UOP 0.18m/kg/hr. LBM 05/23/20. NGT out. Drains 850 out/24hr. I/O -327 (per chart, incomplete documentation)   GI Imaging: 9/5: CT with signs of sequela of severe pancreatitis 9/7: HIDA with persistent bile leak with accumulations and paracolic gutter. 9/9: CT: pancreatitis (peripancreatic fat stranding and peripancreatic fluid, percutaneous drain at the gallbladder fossa, unchanged biliary stent with no intrahepatic biliary duct dilatation, bilateral pleural effusions.  9/15: KUB with diffuse colonic stool, no bowel obstruction/free air  Surgeries / Procedures:  8/18: ERCP and biliary stent exchange 9/9: Unsuccessful ERCP 9/10: Paracentesis 1.4L out   GI Meds: Acidophilus 2 daily, Phenergan 12.574mIV q4h PRN (last dose 9/6), IV PPI/12h, Miralax daily  Central  access: PICC placed 9/11. TPN start date: 9/11  Nutritional Goals (per RD recommendation on 05/18/20): kcal: 2300-2500, Protein: 130-145g, Fluid: >2.3L Goal TPN rate is 100 mL/24hr (provides 132 g of protein and 2342 kcals per day)  Current Nutrition: 9/15- Advanced to FLD - Ensure Enlive PO BID (total: 700kcal, 40g protein) ordered but not all charted - Magic cup BID w/ meals (580kcal, 18g protein) -9/16 Patient thinks he'll only be able to eat a few spoonfuls of magic cup and cream of wheat plus juices    Plan:  Cycle TPN over 18 hours at 1800 with a rate of 7167mr for the first and last hour, then at 141m98m after first hour for 16 hours. (provides 132g AA, 67g ILE, 336g CHO and 2342kcal) +Feeding supplements ordered (provides 1280 kcal, 58g protein) -Meeting >100% of the patient's needs (total kcal 3622, 190g protein) but patient not drinking all supplements F/u PO intake and decrease TPN as able  Electrolytes in TPN: increase to 45mE54mof K, decrease to 8mmol23mof Phos and continue 145mEq/6m Na, 0mEq/L 40mCalcium, 8mEq/L o64mg. Cl:Ac ratio 1:1 Add standard MVI and trace elements to TPN Continue Moderate SSI q8h AM labs  Change meds to po (MVI, Phenergan, PPI) when tolerating diet reliably  Sarah RicRedgie Grayery Student  I discussed / reviewed the pharmacy note by Dr. Ricketts Walden Fieldree with the student's findings and plans as documented.  Ramina Hulet CheBenetta Spar BCPS, BCCP Clinical Pharmacist  Please check AMION for all [facility] After 10:00 PM, call Main [facility] [phone]

## 2020-05-24 NOTE — Plan of Care (Signed)
  Problem: Education: Goal: Knowledge of General Education information will improve Description: Including pain rating scale, medication(s)/side effects and non-pharmacologic comfort measures Outcome: Progressing   Problem: Health Behavior/Discharge Planning: Goal: Ability to manage health-related needs will improve Outcome: Progressing   Problem: Clinical Measurements: Goal: Ability to maintain clinical measurements within normal limits will improve Outcome: Progressing Goal: Will remain free from infection Outcome: Progressing Goal: Diagnostic test results will improve Outcome: Progressing Goal: Respiratory complications will improve Outcome: Progressing Goal: Cardiovascular complication will be avoided Outcome: Progressing   Problem: Activity: Goal: Risk for activity intolerance will decrease Outcome: Progressing   Problem: Nutrition: Goal: Adequate nutrition will be maintained Outcome: Progressing   Problem: Coping: Goal: Level of anxiety will decrease Outcome: Progressing   Problem: Elimination: Goal: Will not experience complications related to bowel motility Outcome: Progressing Goal: Will not experience complications related to urinary retention Outcome: Progressing   Problem: Pain Managment: Goal: General experience of comfort will improve Outcome: Progressing   Problem: Safety: Goal: Ability to remain free from injury will improve Outcome: Progressing   Problem: Skin Integrity: Goal: Risk for impaired skin integrity will decrease Outcome: Progressing   Problem: Education: Goal: Knowledge of disease or condition will improve Outcome: Progressing Goal: Understanding of medication regimen will improve Outcome: Progressing Goal: Individualized Educational Video(s) Outcome: Progressing   Problem: Activity: Goal: Ability to tolerate increased activity will improve Outcome: Progressing   Problem: Cardiac: Goal: Ability to achieve and maintain  adequate cardiopulmonary perfusion will improve Outcome: Progressing   Problem: Health Behavior/Discharge Planning: Goal: Ability to safely manage health-related needs after discharge will improve Outcome: Progressing   Problem: Activity: Goal: Ability to tolerate increased activity will improve Outcome: Progressing   Problem: Respiratory: Goal: Ability to maintain a clear airway and adequate ventilation will improve Outcome: Progressing   Problem: Role Relationship: Goal: Method of communication will improve Outcome: Progressing

## 2020-05-24 NOTE — Progress Notes (Signed)
Occupational Therapy Treatment Patient Details Name: Searcy Miyoshi MRN: [id number] DOB: 07/02/1956 Today's Date: 05/24/2020    History of present illness Pt is a 64 yo male who presents to the ED with severe abdominal pain, nausea and vomiting. Found to have post ERCP acute pancreatits. Pt developed Hypoxic resp failure and was Intubated 8/22  and extubated 8/24. Pt also with new onset a fib and acute ileus. PMH includes HTN and ERCP.    OT comments  Pt pleasant and participatory during OT session. Session focused on education of UE HEP using theraband for increased strength and endurance. After initial demonstration and tactile cues, pt able to return demonstrate each exercise with excellent carryover. Pt did endorse intermittent SOB prior to start of session and after exercises on RA. SpO2 91% at start of session, 87% after exercise on RA and returned quickly to 92% on RA. Reinforced breathing techniques and energy conservation strategies with pt verbalizing understanding.    Follow Up Recommendations  Home health OT;Supervision/Assistance - 24 hour    Equipment Recommendations  3 in 1 bedside commode    Recommendations for Other Services      Precautions / Restrictions Precautions Precautions: Fall Precaution Comments: abdominal drain, watch O2 sats Restrictions Weight Bearing Restrictions: No       Mobility Bed Mobility Overal bed mobility: Needs Assistance Bed Mobility: Supine to Sit     Supine to sit: Supervision     General bed mobility comments: Increased time but able to rise into sitting at edge of bed.  Transfers Overall transfer level: Needs assistance Equipment used: Rolling walker (2 wheeled) Transfers: Sit to/from Stand Sit to Stand: Supervision         General transfer comment: Poor hand placement and RW safety.    Balance Overall balance assessment: Needs assistance Sitting-balance support: Feet supported;No upper extremity supported Sitting  balance-Leahy Scale: Good Sitting balance - Comments: no LOB with donning socks   Standing balance support: During functional activity;Single extremity supported Standing balance-Leahy Scale: Fair Standing balance comment: increased balance with one UE support                           ADL either performed or assessed with clinical judgement   ADL                                               Vision   Vision Assessment?: No apparent visual deficits   Perception     Praxis      Cognition Arousal/Alertness: Awake/alert Behavior During Therapy: WFL for tasks assessed/performed;Anxious Overall Cognitive Status: Impaired/Different from baseline Area of Impairment: Attention;Memory;Awareness;Problem solving                   Current Attention Level: Selective Memory: Decreased short-term memory Following Commands: Follows one step commands consistently;Follows one step commands with increased time Safety/Judgement: Decreased awareness of deficits;Decreased awareness of safety Awareness: Emergent Problem Solving: Difficulty sequencing;Slow processing;Requires verbal cues;Requires tactile cues General Comments: Pt with some slow processing but overall functional with conversation and following directions        Exercises Exercises: General Upper Extremity General Exercises - Upper Extremity Shoulder Flexion: Strengthening;Both;20 reps;Seated;Theraband Theraband Level (Shoulder Flexion): Level 1 (Yellow) Shoulder Horizontal ABduction: Strengthening;Both;20 reps;Seated;Theraband Theraband Level (Shoulder Horizontal Abduction): Level 1 (Yellow) Elbow Flexion: Strengthening;Both;20 reps;Seated;Theraband Theraband Level (  Elbow Flexion): Level 1 (Yellow) Elbow Extension: Strengthening;Both;20 reps;Seated;Theraband Theraband Level (Elbow Extension): Level 1 (Yellow)   Shoulder Instructions       General Comments Pt reporting difficulty  catching breath, assessed SPO2 on RA at 91-92% prior to activity, 87% after exercises on RA. Pt reports feeling more comfortable sleeping with O2 on, so placed 2 L O2 on pt before exit. Reinforced energy conservation strategies and breathing techniques combat SOB     Pertinent Vitals/ Pain       Pain Assessment: No/denies pain Pain Location: abdomen- denies pain but uncomfortable distended abdomen Pain Descriptors / Indicators: Discomfort Pain Intervention(s): Monitored during session;Repositioned  Home Living                                          Prior Functioning/Environment              Frequency  Min 2X/week        Progress Toward Goals  OT Goals(current goals can now be found in the care plan section)  Progress towards OT goals: Progressing toward goals  Acute Rehab OT Goals Patient Stated Goal: go home OT Goal Formulation: With patient Time For Goal Achievement: 05/31/20 Potential to Achieve Goals: Good ADL Goals Pt Will Perform Grooming: with supervision;standing Pt Will Perform Lower Body Bathing: with modified independence;sit to/from stand Pt Will Perform Lower Body Dressing: with modified independence;sit to/from stand Pt Will Transfer to Toilet: with modified independence;ambulating Pt Will Perform Toileting - Clothing Manipulation and hygiene: with modified independence;sit to/from stand Pt/caregiver will Perform Home Exercise Program: Increased strength;With theraband;With written HEP provided;Independently Additional ADL Goal #1: Pt will independently verbalize 3 energy conservation strategies  Plan Discharge plan remains appropriate    Co-evaluation                 AM-PAC OT "6 Clicks" Daily Activity     Outcome Measure   Help from another person eating meals?: None Help from another person taking care of personal grooming?: A Little Help from another person toileting, which includes using toliet, bedpan, or urinal?: A  Little Help from another person bathing (including washing, rinsing, drying)?: A Little Help from another person to put on and taking off regular upper body clothing?: A Little Help from another person to put on and taking off regular lower body clothing?: A Little 6 Click Score: 19    End of Session    OT Visit Diagnosis: Unsteadiness on feet (R26.81);Other abnormalities of gait and mobility (R26.89);Muscle weakness (generalized) (M62.81);Other symptoms and signs involving cognitive function   Activity Tolerance Patient tolerated treatment well   Patient Left in bed;with call bell/phone within reach;with bed alarm set   Nurse Communication          Time: 1351-1420 OT Time Calculation (min): 29 min  Charges: OT General Charges $OT Visit: 1 Visit OT Treatments $Therapeutic Exercise: 8-22 mins  Layla Maw, OTR/L   Layla Maw 05/24/2020, 2:20 PM

## 2020-05-24 NOTE — Progress Notes (Signed)
Physical Therapy Treatment Patient Details Name: Adam Fowler MRN: [id number] DOB: [birth year] Today's Date: 05/24/2020    History of Present Illness Pt is a 64 yo male who presents to the ED with severe abdominal pain, nausea and vomiting. Found to have post ERCP acute pancreatits. Pt developed Hypoxic resp failure and was Intubated 8/22  and extubated 8/24. Pt also with new onset a fib and acute ileus. PMH includes HTN and ERCP.     PT Comments    Pt supine in bed on arrival.  He reports he had a rough night last night with loose BM's.  Pt's abdomen is distended and drain full on arrival.  Informed nursing.  Pt performed trial of gt without SPO2 and remains to desat requiring 2Lpm.  Pt seated in recliner post session will continue to require HHPT at d/c.        Follow Up Recommendations  Home health PT;Supervision for mobility/OOB     Equipment Recommendations  None recommended by PT    Recommendations for Other Services       Precautions / Restrictions Precautions Precautions: Fall Precaution Comments: abdominal drain, NGT; watch O2 sats Restrictions Weight Bearing Restrictions: No    Mobility  Bed Mobility Overal bed mobility: Needs Assistance Bed Mobility: Supine to Sit     Supine to sit: Supervision     General bed mobility comments: Increased time but able to rise into sitting at edge of bed.  Transfers Overall transfer level: Needs assistance Equipment used: Rolling walker (2 wheeled) Transfers: Sit to/from Stand Sit to Stand: Supervision         General transfer comment: Poor hand placement and RW safety.  Ambulation/Gait Ambulation/Gait assistance: Min guard Gait Distance (Feet): 150 Feet (x3) Assistive device: IV Pole Gait Pattern/deviations: Step-through pattern;Decreased stride length;Trunk flexed Gait velocity: decreased   General Gait Details: Pt performed withj RW this session first trial performed without O2 and SPo2 dropped to 85% on RA,  applied 2lpm for additional 2 trials.  Pt able to maintain SPO2 92% or greater,  He ranged from 109bpm-165bpm per portable pulse ox.   Stairs             Wheelchair Mobility    Modified Rankin (Stroke Patients Only)       Balance Overall balance assessment: Needs assistance Sitting-balance support: Feet supported;No upper extremity supported Sitting balance-Leahy Scale: Good Sitting balance - Comments: no LOB with donning socks   Standing balance support: During functional activity;Single extremity supported Standing balance-Leahy Scale: Fair Standing balance comment: increased balance with one UE support                            Cognition Arousal/Alertness: Awake/alert Behavior During Therapy: WFL for tasks assessed/performed;Anxious Overall Cognitive Status: Impaired/Different from baseline Area of Impairment: Attention;Memory;Awareness                   Current Attention Level: Selective Memory: Decreased short-term memory Following Commands: Follows one step commands consistently;Follows one step commands with increased time Safety/Judgement: Decreased awareness of deficits;Decreased awareness of safety Awareness: Emergent Problem Solving: Difficulty sequencing;Slow processing;Requires verbal cues;Requires tactile cues        Exercises      General Comments        Pertinent Vitals/Pain Pain Assessment: No/denies pain Pain Location: abdomen- denies pain but uncomfortable distended abdomen Pain Descriptors / Indicators: Discomfort Pain Intervention(s): Monitored during session;Repositioned    Home Living  Prior Function            PT Goals (current goals can now be found in the care plan section) Acute Rehab PT Goals Patient Stated Goal: go home Potential to Achieve Goals: Good Progress towards PT goals: Progressing toward goals    Frequency    Min 3X/week      PT Plan Current plan  remains appropriate    Co-evaluation              AM-PAC PT "6 Clicks" Mobility   Outcome Measure  Help needed turning from your back to your side while in a flat bed without using bedrails?: None Help needed moving from lying on your back to sitting on the side of a flat bed without using bedrails?: None Help needed moving to and from a bed to a chair (including a wheelchair)?: None Help needed standing up from a chair using your arms (e.g., wheelchair or bedside chair)?: None Help needed to walk in hospital room?: None Help needed climbing 3-5 steps with a railing? : None 6 Click Score: 24    End of Session Equipment Utilized During Treatment: Oxygen Activity Tolerance: Patient tolerated treatment well Patient left: in chair;with call bell/phone within reach;with chair alarm set Nurse Communication: Mobility status;Other (comment) (drain full and needs to be emptied.) PT Visit Diagnosis: Unsteadiness on feet (R26.81);Muscle weakness (generalized) (M62.81);Difficulty in walking, not elsewhere classified (R26.2)     Time: 1062-6948 PT Time Calculation (min) (ACUTE ONLY): 30 min  Charges:  $Gait Training: 23-37 mins                     Erasmo Leventhal , PTA Acute Rehabilitation Services Pager [phone] Office [phone]     Keven Soucy Eli Hose 05/24/2020, 11:18 AM

## 2020-05-24 NOTE — Plan of Care (Signed)
  Problem: Education: Goal: Knowledge of General Education information will improve Description: Including pain rating scale, medication(s)/side effects and non-pharmacologic comfort measures Outcome: Progressing   Problem: Health Behavior/Discharge Planning: Goal: Ability to manage health-related needs will improve Outcome: Progressing   Problem: Activity: Goal: Risk for activity intolerance will decrease Outcome: Progressing   Problem: Nutrition: Goal: Adequate nutrition will be maintained Outcome: Progressing   Problem: Coping: Goal: Level of anxiety will decrease Outcome: Progressing   Problem: Elimination: Goal: Will not experience complications related to bowel motility Outcome: Progressing Goal: Will not experience complications related to urinary retention Outcome: Progressing   Problem: Skin Integrity: Goal: Risk for impaired skin integrity will decrease Outcome: Progressing   Problem: Education: Goal: Knowledge of disease or condition will improve Outcome: Progressing Goal: Understanding of medication regimen will improve Outcome: Progressing Goal: Individualized Educational Video(s) Outcome: Progressing   Problem: Activity: Goal: Ability to tolerate increased activity will improve Outcome: Progressing   Problem: Cardiac: Goal: Ability to achieve and maintain adequate cardiopulmonary perfusion will improve Outcome: Progressing   Problem: Health Behavior/Discharge Planning: Goal: Ability to safely manage health-related needs after discharge will improve Outcome: Progressing   Problem: Activity: Goal: Ability to tolerate increased activity will improve Outcome: Progressing   Problem: Respiratory: Goal: Ability to maintain a clear airway and adequate ventilation will improve Outcome: Progressing

## 2020-05-24 NOTE — Progress Notes (Addendum)
Daily Rounding Note  05/24/2020, 12:22 PM  LOS: 27 days   SUBJECTIVE:   Chief complaint: Post ERCP pancreatitis.  Persistent bile leak.      Daily Bile drainage 900 >> 600 >> 450 >> 850 mL through yesterday.   TNA continues.   Patient worn out from his nighttime experiences of 6 or 7 formed and loose brown stools, episodes of shortness of breath which he says attributing to the TNA. Tried to go without Dilaudid but the lower back pain got excruciating, radiated up with the spine and he got Dilaudid for this. Tolerating full liquids without nausea or vomiting.  No negative repercussions after p.o. intake.  No appetite and not ready for advancing his diet.   OBJECTIVE:         Vital signs in last 24 hours:    Temp:  [98.1 F (36.7 C)-98.5 F (36.9 C)] 98.4 F (36.9 C) (09/16 0749) Pulse Rate:  [96-101] 96 (09/16 0749) Resp:  [17-18] 18 (09/16 0749) BP: (115-135)/(76-84) 129/81 (09/16 0749) SpO2:  [91 %-99 %] 99 % (09/16 0749) Weight:  [86.3 kg] 86.3 kg (09/16 0500) Last BM Date: 05/23/20 Filed Weights   05/18/20 0500 05/23/20 0457 05/24/20 0500  Weight: 84.6 kg 85.9 kg 86.3 kg   General: Looks like he has had a bit of a setback today, does not look as well and anxiety is back Heart: RRR Chest: Some labored breathing with prolonged speech.  Lungs are clear but breath sounds diminished.  No cough. Abdomen: Nontender, soft.  Active bowel sounds. Extremities: No CCE. Neuro/Psych: Anxious.  Moves all 4 limbs, no tremors, no gross weakness or deficits.  Intake/Output from previous day: 09/15 0701 - 09/16 0700 In: 1261.8 [P.O.:480; I.V.:781.8] Out: 1501 [Urine:650; Drains:850; Stool:1]  Intake/Output this shift: No intake/output data recorded.  Lab Results: Recent Labs    05/22/20 1337 05/23/20 0455  WBC 28.8* 23.1*  HGB 11.1* 10.3*  HCT 34.7* 33.1*  PLT 278 235   BMET Recent Labs    05/22/20 0424  05/23/20 0455 05/24/20 0405  NA 133* 132* 135  K 3.9 3.9 3.9  CL 100 100 104  CO2 24 23 24   GLUCOSE 171* 176* 209*  BUN 18 21 20   CREATININE 0.65 0.58* 0.66  CALCIUM 8.6* 8.5* 8.6*   LFT Recent Labs    05/22/20 0424 05/24/20 0405  PROT 5.4* 5.7*  ALBUMIN 1.6* 1.6*  AST 20 20  ALT 16 17  ALKPHOS 276* 220*  BILITOT 0.6 0.7  BILIDIR 0.2  --   IBILI 0.4  --    PT/INR No results for input(s): LABPROT, INR in the last 72 hours. Hepatitis Panel No results for input(s): HEPBSAG, HCVAB, HEPAIGM, HEPBIGM in the last 72 hours.  Studies/Results: DG Abd 1 View  Result Date: 05/23/2020 CLINICAL DATA:  Abdominal pain EXAM: ABDOMEN - 1 VIEW COMPARISON:  May 16, 2020; CT abdomen and pelvis May 17, 2020 FINDINGS: Pigtail catheter remains on the right laterally. Biliary stent in place. There is moderate air in the stomach without gross gastric distention. There is diffuse stool in the colon. There is no small dilatation. No air-fluid level. No free air evident on supine examination. IMPRESSION: Pigtail catheter on the right laterally. Biliary stent in place. Diffuse stool in colon. No bowel obstruction or free air. Electronically Signed   By: Lowella Grip III M.D.   On: 05/23/2020 10:18   DG CHEST PORT 1 VIEW  Result  Date: 05/24/2020 CLINICAL DATA:  Shortness of breath EXAM: PORTABLE CHEST 1 VIEW COMPARISON:  05/21/2020 FINDINGS: Right PICC line remains in place, unchanged. Low lung volumes. Patchy bilateral airspace opacities, most pronounced in the left lower lobe, similar to prior study. Heart is normal size. Suspect small left effusion. No pneumothorax. IMPRESSION: Stable patchy bilateral airspace disease, left greater than right with low lung volumes. Suspect small left effusion. Electronically Signed   By: Rolm Baptise M.D.   On: 05/24/2020 09:46   Scheduled Meds: . acetaminophen  650 mg Oral Q6H  . acidophilus  2 capsule Oral Daily  . Chlorhexidine Gluconate Cloth  6  each Topical Daily  . enoxaparin (LOVENOX) injection  40 mg Subcutaneous Q24H  . feeding supplement (ENSURE ENLIVE)  237 mL Oral BID BM  . influenza vac split quadrivalent PF  0.5 mL Intramuscular Tomorrow-1000  . insulin aspart  0-15 Units Subcutaneous Q8H  . mouth rinse  15 mL Mouth Rinse BID  . oxyCODONE  10 mg Oral Q12H  . pantoprazole  40 mg Oral BID  . polyethylene glycol  17 g Oral Daily  . pravastatin  20 mg Oral q1800  . sertraline  100 mg Oral Daily  . sodium chloride flush  10-40 mL Intracatheter Q12H  . traZODone  50 mg Oral QHS   Continuous Infusions: . sodium chloride Stopped (05/07/20 0923)  . TPN ADULT (ION) 100 mL/hr at 05/24/20 0300  . 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  Long term GB fossa drain in place.      * Post ERCP pancreatitis, severe.    * GOO due to pancreatitis. NGT came out 9/14.  No GOO on KUB 9/15.   Tolerating   * Dyspnea.   This morning's portable CXR shows patchy, stable bilateral airspace disease greater on the left, low lung volumes, suspicion for small left pleural effusion.   Had been on Meropenem  8/27 - 9/11.   WBCs 28.8 >> 23.1.   Room air sats today in the mid to upper 90s.  * Stable Hardwick anemia.  *   Hyponatremia.     *   Low back pain.    *   IDDM.    *    Anxiety, depression.  Continues on his home Zoloft.  *    Significant stool output in the last 24 hours after close to 2 weeks without significant stooling.  Received 10 mg of p.o. Dulcolax at 1 PM, once daily MiraLAX.   PLAN   *     Leave diet at full liquids.  Continue TNA.  *     If diarrheal stools continue to be an issue through tomorrow, will check for C. difficile but recent stool  output can be attributed to his Dulcolax yesterday.  Is at risk for C. difficile given prolonged exposure to powerful broad-spectrum antibiotic   Adam Fowler  05/24/2020, 12:22 PM Phone [phone]   Attending physician's note   I have taken an interval history, reviewed the chart and examined the patient. I agree with the Advanced Practitioner's note, impression and recommendations.   Diarrhea likely secondary to laxatives, continue to monitor.  Hold laxatives.  If persistent, will consider checking for C. Difficile   Continue diet as tolerated, currently at full liquids  He  continues to have low back pain and abdominal discomfort, requiring narcotics for pain control  Persistent high volume bilious output from the drain, he developed post ERCP pancreatitis after stent placement.  May need surgical intervention at some point.  Continue supportive care  GI will continue to follow along    I have spent 35 minutes of patient care (this includes precharting, chart review, review of results, face-to-face time used for counseling as well as treatment plan and follow-up. The patient was provided an opportunity to ask questions and all were answered. The patient agreed with the plan and demonstrated an understanding of the instructions.  Damaris Hippo , MD [phone]

## 2020-05-24 NOTE — Progress Notes (Signed)
PROGRESS NOTE    Adam Fowler  CHY:[id number] DOB: 02-10-1956 DOA: 04/26/2020 PCP: Melony Overly, MD    Brief Narrative:  Adam Fowler is a 64 year old male with past medical history notable for essential hypertension who underwent ERCP on 04/25/2020 for biliary stent exchange for persistent postoperative bile leak related to recent cholecystectomy with abscess status post percutaneous drain in which he developed severe epigastric abdominal pain associate with nausea, vomiting.  CT abdomen/pelvis notable for changes consistent with acute pancreatitis without abscess or drainable fluid collection likely related to postoperative ERCP pancreatitis.  8/21-8/24: Rapid response for increased work of breathing and atrial fibrillation requiring invasive mechanical ventilation.  Treated for volume overload and aspiration pneumonia with antibiotic therapy with Zosyn, liberated from mechanical ventilation on 05/01/2020 and transition to nasal cannula.  8/25-9/3: Continues with leukocytosis, abdominal pain with repeat CT abdomen/pelvis with markedly diffuse pancreatic inflammation with fat stranding and free fluid.  Zosyn changed to meropenem on 8/27 and placed on tube feeds via NG tube for several days.  9/3-9/7: NG tube removed, patient was tolerating oral nutritional support.  But continue with high output biliary drain despite stent in appropriate location and stable liver enzymes.  Repeat CT abdomen/pelvis 05/13/2020 with signs/sequela of severe pancreatitis and stable positioning of internal biliary stents with no intrahepatic biliary duct dilation.  HIDA scan 05/15/2020 with persistent bile leak accumulating the lateral aspect of the right upper quadrant, paracolic gutter, patient with poor oral intake.  9/9: Unsuccessful ERCP, unable to pass beyond D1/D2 sweep, patient with significant gastric fluid retention related to functional outlet obstruction, NG tube reinserted and placed on intermittent suction.   Repeat abdomen/pelvis 9/9 with pancreatitis, pain peripancreatic fat stranding and peripancreatic fluid, percutaneous drain at the gallbladder fossa, unchanged blurry stent with no intrahepatic bladder dilation, bilateral pleural effusions.  9/10: PICC line placed for initiation of TPN.  9/14: NG tube removed accidentally by patient.  Had a bowel movement overnight.  9/15: Abdominal pain improved, NG tube removed overnight accidentally.  Had a bowel movement.  GI advancing to full liquid diet.  9/16: Patient complaining of shortness of breath overnight, improved with breathing treatment.  Currently oxygenating well on room air.  X-ray with possible small left pleural effusion and patchy airspace disease, otherwise unrevealing.  With bowel movement yesterday.  GI plans to continue full liquid diet with TPN today.  Continue to monitor output from drain.   Assessment & Plan:   Principal Problem:   Post-ERCP acute pancreatitis Active Problems:   Severe sepsis (HCC)   Polycythemia   AKI (acute kidney injury) ([place])   Metabolic acidosis   Acute respiratory failure with hypoxia (HCC)   Bilateral pleural effusion   Ileus (HCC)   Postoperative bile leak   Gastric outlet obstruction   Acute post ERCP pancreatitis/ileus Bile leak following cholecystectomy, persistent Patient presenting following increased abdominal pain following ERCP.  Lipase 1379.  CT abdomen/pelvis notable for pancreatitis.  Previous cholecystectomy with bile leak that has persisted despite bile duct stenting.  Gallbladder fossa drain in place placed by IR on 02/13/2020. --[facility]/IR following, appreciate assistance --General surgery signed off 05/18/2020, no surgical intervention required at this time --NG tube pulled out on 05/22/2020, diet advanced to full liquids by GI on 9/15, will continue today --Continue TPN with pharmacy monitoring/dosing --Continue to monitor gallbladder fossa drain output, 850 mL out p24h  Acute  hypoxic respiratory failure: Resolved Bilateral pleural effusions On 04/28/2020, patient developed severe respiratory distress, rapid response patient was transferred  to the ICU with mechanical ventilation.  Etiology volume overload with aspiration pneumonia.  Was successfully extubated on 05/01/2020.  Completed course of antibiotics with vancomycin, Zosyn and meropenem.  Supplemental oxygen has been titrated off and oxygenating well on room air. --Supportive care  Acute renal failure: Resolved Patient presenting with creatinine 1.41.  Etiology likely poor oral intake coupled with acute pancreatitis as above.  Patient was treated with IV fluid hydration, now supported with TPN with further advancement of diet.  Now resolved with creatinine 0.66.  Hypokalemia Hypomagnesemia Repleted, currently on TPN.  Potassium 3.9 --Continue to follow electrolytes closely daily  Paroxysmal atrial fibrillation, transient: Resolved Spontaneously converted to normal sinus rhythm, will CHA2DS2-VASc score = 1, low risk of thrombosis.  Telemetry has been discontinued.  Chronic pain syndrome: On oxycodone 10 mg every 4 hours as needed for pain at home. --Tylenol 650 mg every 6 hours scheduled --OxyContin 10 mg p.o. every 12 hours --Lidocaine patch --OxyIR, 5 mg every 4 hours as needed for moderate breakthrough pain; last used 9/10 at 0600 --Dilaudid 1 mg IV every 3 hours as needed severe breakthrough pain, continues to use fairly regularly 6 doses on 9/14 --Will need to slowly titrate down narcotic burden  Anemia of chronic disease Etiology likely secondary to malnutrition.  Hemoglobin 10.3, stable. --Continue to monitor hemoglobin, transfuse for hemoglobin less than 7.0.  Essential hypertension On lisinopril and HCTZ at home.  BP 126/80. --Continue to hold home antihypertensives for now  Dyslipidemia: Continue pravastatin  Hyperglycemia Hemoglobin A1c 5.6. --Continue insulin therapy per TPN  protocol  Moderate calorie protein malnutrition Nutrition Status: Nutrition Problem: Inadequate oral intake Etiology: acute illness Signs/Symptoms: NPO status Interventions: TPN, Ensure Enlive (each supplement provides 350kcal and 20 grams of protein) --Continue TPN, pharmacy consulted for assistance dosing/monitoring --Continue full liquid diet per GI today  Depression/anxiety: Continue sertraline and trazodone   DVT prophylaxis: Lovenox Code Status: Full code Family Communication: Updated patient extensively at bedside  Disposition Plan:  Status is: Inpatient  Remains inpatient appropriate because:Ongoing active pain requiring inpatient pain management, Ongoing diagnostic testing needed not appropriate for outpatient work up, Unsafe d/c plan, IV treatments appropriate due to intensity of illness or inability to take PO and Inpatient level of care appropriate due to severity of illness continues on full code diet, remains on TPN   Dispo:  Patient From:  Home  Planned Disposition: Home  Expected discharge date: 05/28/20  Medically stable for discharge:  No    Consultants:   [facility]  Interventional radiology  General surgery -signed off 05/18/2020  Procedures:   ERCP 9/9  Antimicrobials:   Zosyn 8/19-8/26  Meropenem 8/27-9/11  Vancomycin 8/22-8/23   Subjective: Patient seen and examined bedside, resting comfortably.  Reports shortness of breath overnight, improved with breathing treatment.  Chest x-ray shows patchy airspace disease with questionable left pleural effusion that is small.  Oxygenating well on room air.  Abdominal pain continues to improve, almost resolved.  Passing flatus.  Bowel movement overnight. No other complaints or concerns at this time.  Denies headache, no visual changes, no chest pain, no palpitations, no nausea/vomiting, no diarrhea, no fever/chills/night sweats, no weakness, no fatigue.  No acute events overnight per nursing  staff.  Objective: Vitals:   05/24/20 0003 05/24/20 0500 05/24/20 0503 05/24/20 0749  BP: 115/84  135/83 129/81  Pulse: 100  (!) 101 96  Resp:    18  Temp: 98.1 F (36.7 C)  98.2 F (36.8 C) 98.4 F (36.9 C)  TempSrc: Oral  Oral Oral  SpO2: 96%  96% 99%  Weight:  86.3 kg    Height:        Intake/Output Summary (Last 24 hours) at 05/24/2020 1444 Last data filed at 05/24/2020 0300 Gross per 24 hour  Intake 1261.8 ml  Output 1201 ml  Net 60.8 ml   Filed Weights   05/18/20 0500 05/23/20 0457 05/24/20 0500  Weight: 84.6 kg 85.9 kg 86.3 kg    Examination:  General exam: Appears calm and comfortable, chronically ill appearance Respiratory system: Clear to auscultation. Respiratory effort normal.  Oxygenating well on room air Cardiovascular system: S1 & S2 heard, RRR. No JVD, murmurs, rubs, gallops or clicks. No pedal edema. Gastrointestinal system: Abdomen is distended, soft and nontender. No organomegaly or masses felt.  Faint bowel sounds noted, improved since yesterday Central nervous system: Alert and oriented. No focal neurological deficits. Extremities: Symmetric 5 x 5 power. Skin: No rashes, lesions or ulcers Psychiatry: Judgement and insight appear normal. Mood & affect appropriate.     Data Reviewed: I have personally reviewed following labs and imaging studies  CBC: Recent Labs  Lab 05/18/20 0513 05/18/20 0513 05/19/20 1214 05/20/20 1834 05/21/20 0418 05/22/20 1337 05/23/20 0455  WBC 31.3*   < > 22.4* 26.4* 22.5* 28.8* 23.1*  NEUTROABS 29.5*  --  19.8*  --  19.3* 24.1* 18.4*  HGB 11.5*   < > 11.8* 10.8* 10.3* 11.1* 10.3*  HCT 35.6*   < > 37.6* 34.3* 33.3* 34.7* 33.1*  MCV 87.5   < > 88.7 86.8 89.0 87.6 87.6  PLT 332   < > 306 233 229 278 235   < > = values in this interval not displayed.   Basic Metabolic Panel: Recent Labs  Lab 05/20/20 0314 05/21/20 0418 05/22/20 0424 05/23/20 0455 05/24/20 0405  NA 131* 132* 133* 132* 135  K 3.7 3.6 3.9 3.9  3.9  CL 97* 99 100 100 104  CO2 26 24 24 23 24   GLUCOSE 135* 182* 171* 176* 209*  BUN 13 15 18 21 20   CREATININE 0.70 0.68 0.65 0.58* 0.66  CALCIUM 8.1* 8.4* 8.6* 8.5* 8.6*  MG 2.0 1.9 1.8 1.9 2.0  PHOS 2.4* 3.0 3.2 3.2 3.7   GFR: Estimated Creatinine Clearance: 101.5 mL/min (by C-G formula based on SCr of 0.66 mg/dL). Liver Function Tests: Recent Labs  Lab 05/18/20 0513 05/19/20 1214 05/21/20 0418 05/22/20 0424 05/24/20 0405  AST 51* 24 17 20 20   ALT 45* 29 18 16 17   ALKPHOS 753* 500* 300* 276* 220*  BILITOT 1.3* 1.1 0.7 0.6 0.7  PROT 5.2* 5.5* 5.2* 5.4* 5.7*  ALBUMIN 1.9* 1.9* 1.6* 1.6* 1.6*   No results for input(s): LIPASE, AMYLASE in the last 168 hours. No results for input(s): AMMONIA in the last 168 hours. Coagulation Profile: No results for input(s): INR, PROTIME in the last 168 hours. Cardiac Enzymes: No results for input(s): CKTOTAL, CKMB, CKMBINDEX, TROPONINI in the last 168 hours. BNP (last 3 results) No results for input(s): PROBNP in the last 8760 hours. HbA1C: No results for input(s): HGBA1C in the last 72 hours. CBG: Recent Labs  Lab 05/23/20 2002 05/23/20 2357 05/24/20 0428 05/24/20 0747 05/24/20 1144  GLUCAP 189* 185* 196* 189* 201*   Lipid Profile: No results for input(s): CHOL, HDL, LDLCALC, TRIG, CHOLHDL, LDLDIRECT in the last 72 hours. Thyroid Function Tests: No results for input(s): TSH, T4TOTAL, FREET4, T3FREE, THYROIDAB in the last 72 hours. Anemia Panel: No results for input(s):  VITAMINB12, FOLATE, FERRITIN, TIBC, IRON, RETICCTPCT in the last 72 hours. Sepsis Labs: No results for input(s): PROCALCITON, LATICACIDVEN in the last 168 hours.  Recent Results (from the past 240 hour(s))  Culture, body fluid-bottle     Status: None   Collection Time: 05/18/20  5:13 PM   Specimen: Fluid  Result Value Ref Range Status   Specimen Description FLUID PERITONEAL  Final   Special Requests BOTTLES DRAWN AEROBIC AND ANAEROBIC  Final   Culture    Final    NO GROWTH 5 DAYS Performed at [facility], [place] [address], [place], [place] [address]    Report Status 05/23/2020 FINAL  Final  Gram stain     Status: None   Collection Time: 05/18/20  5:13 PM   Specimen: Fluid  Result Value Ref Range Status   Specimen Description FLUID PERITONEAL  Final   Special Requests NONE  Final   Gram Stain   Final    WBC PRESENT, PREDOMINANTLY MONONUCLEAR NO ORGANISMS SEEN CYTOSPIN SMEAR Performed at [facility], [place] [address] [place], [place] [address]    Report Status 05/19/2020 FINAL  Final         Radiology Studies: DG Abd 1 View  Result Date: 05/23/2020 CLINICAL DATA:  Abdominal pain EXAM: ABDOMEN - 1 VIEW COMPARISON:  May 16, 2020; CT abdomen and pelvis May 17, 2020 FINDINGS: Pigtail catheter remains on the right laterally. Biliary stent in place. There is moderate air in the stomach without gross gastric distention. There is diffuse stool in the colon. There is no small dilatation. No air-fluid level. No free air evident on supine examination. IMPRESSION: Pigtail catheter on the right laterally. Biliary stent in place. Diffuse stool in colon. No bowel obstruction or free air. Electronically Signed   By: Lowella Grip III M.D.   On: 05/23/2020 10:18   DG CHEST PORT 1 VIEW  Result Date: 05/24/2020 CLINICAL DATA:  Shortness of breath EXAM: PORTABLE CHEST 1 VIEW COMPARISON:  05/21/2020 FINDINGS: Right PICC line remains in place, unchanged. Low lung volumes. Patchy bilateral airspace opacities, most pronounced in the left lower lobe, similar to prior study. Heart is normal size. Suspect small left effusion. No pneumothorax. IMPRESSION: Stable patchy bilateral airspace disease, left greater than right with low lung volumes. Suspect small left effusion. Electronically Signed   By: Rolm Baptise M.D.   On: 05/24/2020 09:46        Scheduled Meds: . acetaminophen  650 mg Oral Q6H  . acidophilus  2 capsule Oral Daily   . Chlorhexidine Gluconate Cloth  6 each Topical Daily  . enoxaparin (LOVENOX) injection  40 mg Subcutaneous Q24H  . feeding supplement (ENSURE ENLIVE)  237 mL Oral BID BM  . influenza vac split quadrivalent PF  0.5 mL Intramuscular Tomorrow-1000  . insulin aspart  0-15 Units Subcutaneous Q8H  . mouth rinse  15 mL Mouth Rinse BID  . oxyCODONE  10 mg Oral Q12H  . pantoprazole  40 mg Oral Daily  . polyethylene glycol  17 g Oral Daily  . pravastatin  20 mg Oral q1800  . sertraline  100 mg Oral Daily  . sodium chloride flush  10-40 mL Intracatheter Q12H  . traZODone  50 mg Oral QHS   Continuous Infusions: . sodium chloride Stopped (05/07/20 0923)  . TPN ADULT (ION) 100 mL/hr at 05/24/20 0300  . TPN CYCLIC-ADULT (ION)       LOS: 27 days    Time spent: 39 minutes  spent on chart review, discussion with nursing staff, consultants, updating family and interview/physical exam; more than 50% of that time was spent in counseling and/or coordination of care.    Sanita Estrada J [place] DO [facility] Available via Epic secure chat 7am-7pm After these hours, please refer to coverage provider listed on [email or web address] 05/24/2020, 2:44 PM

## 2020-05-24 NOTE — Progress Notes (Signed)
Referring Physician(s): Arrien,M  Supervising Physician: Corrie Mckusick  Patient Status:  [facility] - In-pt  Chief Complaint:  Bile leak  Brief History:  Adam Fowler is a 64 year old who was originally admitted on 5/15 for gallstone pancreatitis.    He underwent ERCP x2 during admission with difficult cannulation and ultimately a wire was able to be placed into the CBD to allow cannulation and a biliary sphincterotomy was performed.    He then underwent a subtotal cholecystectomy for acute gangrenous cholecystitis on 5/18.  A drain was left in place postop.    He was discharged on 5/19 and was seen in the General Surgery office on 5/26 for follow-up where the drain output had changed from bilious to purulent.  He was readmitted on 5/28 and underwent a CT scan which showed a 8.6 cm complex fluid collection in the gallbladder fossa.   He underwent a HIDA that was negative for bile leak and showed a patent common bile duct and was discharged home on 5/29.    He then developed a back ache and presented to the ED on 02/13/20.  CT showed= Complex collection in the gallbladder fossa including bubbles of air, without change in size compared to the previous CT. Collection is consistent with an abscess, currently measuring 7.4 x 4.5 x 4.4cm.   He underwent placement of a 12 fr drain in the gallbladder fossa by Dr. Annamaria Boots on the same day.  He was seen in IR clinic for follow up on 04/10/20 and injection showed persistent communication between the decompressed GB fossa and the cystic duct remnant despite internal biliary stent placement.  On 04/25/20 he underwent another ERCP with biliary stent placement.  HIDA done 05/15/20 showed persistent bile leak accumulating in the lateral aspect of the RUQ.  He underwent paracentesis and cholangiogram by Dr. Laurence Ferrari on 05/19/20 = IMPRESSION: 1. Well-positioned and normally functioning right upper quadrant drainage catheter. The fistulous communication  between the drainage catheter and the cystic duct appears to be nearly resolved. This likely explains the decreased output. No exchange or upsize necessary. 2. Successful ultrasound-guided diagnostic paracentesis yielding 1.4 L of bloody ascitic fluid. Samples were sent as requested.  Subjective:  Chart reviewed prior to visiting patient. Saw that he had a restless night due to several bouts of loose stools.  He was sleeping when I entered so I did not wake him.  Allergies: Patient has no known allergies.  Medications: Prior to Admission medications   Medication Sig Start Date End Date Taking? Authorizing Provider  Calcium Carb-Cholecalciferol (CALCIUM+D3 PO) Take 1 tablet by mouth daily.   Yes [provider]  Coenzyme Q10 (COQ10) 400 MG CAPS Take 800 mg by mouth in the morning.    Yes [provider]  Cyanocobalamin (VITAMIN B-12) 5000 MCG TBDP Take 5,000 mcg by mouth daily.   Yes [provider]  GLUCOSAMINE-CHONDROITIN PO Take 1 tablet by mouth in the morning and at bedtime.    Yes [provider]  Javier Docker Oil 1000 MG CAPS Take 1,000 mg by mouth in the morning and at bedtime.    Yes [provider]  lisinopril-hydrochlorothiazide (ZESTORETIC) 10-12.5 MG tablet Take 1 tablet by mouth daily. 03/04/20  Yes [provider]  Moringa Oleifera (MORINGA PO) Take 1,000 mg by mouth 2 (two) times daily.   Yes [provider]  Multiple Vitamin (MULTIVITAMIN WITH MINERALS) TABS tablet Take 1 tablet by mouth daily.   Yes [provider]  ondansetron [facility])  4 MG tablet Take 1 tablet (4 mg total) by mouth every 8 (eight) hours as needed for nausea or vomiting. 04/25/20  Yes Mansouraty, Telford Nab., MD  OVER THE COUNTER MEDICATION Take 1 tablet by mouth See admin instructions. [facility] otc tablet- Take 1 tablet by mouth once a day   Yes [provider]  oxyCODONE (OXY IR/ROXICODONE) 5 MG immediate release tablet Take 5  mg by mouth 2 (two) times daily as needed (for pain).  04/25/20  Yes [provider]  pravastatin (PRAVACHOL) 80 MG tablet Take 40 mg by mouth in the morning and at bedtime.   Yes [provider]  Red Yeast Rice 600 MG CAPS Take 1,200 mg by mouth at bedtime.    Yes [provider]  sertraline (ZOLOFT) 100 MG tablet Take 100 mg by mouth daily. 11/08/19  Yes [provider]  TURMERIC PO Take 1,500 mg by mouth 2 (two) times daily.   Yes [provider]  zolpidem (AMBIEN) 5 MG tablet Take 5 mg by mouth at bedtime. 12/29/19  Yes [provider]  ibuprofen (ADVIL) 200 MG tablet Take 400-600 mg by mouth every 6 (six) hours as needed for headache or moderate pain.    [provider]     Vital Signs: BP 129/81 (BP Location: Left Arm)   Pulse 96   Temp 98.4 F (36.9 C) (Oral)   Resp 18   Ht 5\' 9"  (1.753 m)   Wt 86.3 kg   SpO2 99%   BMI 28.10 kg/m   Physical Exam Constitutional:      Appearance: Normal appearance.     Comments: Sleeping  soundly  HENT:     Head: Normocephalic and atraumatic.  Cardiovascular:     Rate and Rhythm: Normal rate.  Pulmonary:     Effort: Pulmonary effort is normal. No respiratory distress.  Abdominal:     Comments: GB fossa drain in place, copious bilious output in gravity bag. ~850 mL recorded output.  Skin:    General: Skin is warm and dry.  Neurological:     General: No focal deficit present.     Imaging: DG Chest 1 View  Result Date: 05/21/2020 CLINICAL DATA:  Dyspnea. EXAM: CHEST  1 VIEW COMPARISON:  CT abdomen pelvis dated May 17, 2020. Chest x-ray dated May 03, 2020. FINDINGS: Enteric tube in the stomach. Right-sided PICC line with tip at the cavoatrial junction. The heart size and mediastinal contours are within normal limits. Left greater than right bibasilar opacities persist. Unchanged small left pleural effusion. No pneumothorax. Unchanged cholecystostomy tube in the right  upper quadrant. No acute osseous abnormality. IMPRESSION: 1. Unchanged bibasilar pneumonia and small left pleural effusion. Electronically Signed   By: Titus Dubin M.D.   On: 05/21/2020 14:04   DG Abd 1 View  Result Date: 05/23/2020 CLINICAL DATA:  Abdominal pain EXAM: ABDOMEN - 1 VIEW COMPARISON:  May 16, 2020; CT abdomen and pelvis May 17, 2020 FINDINGS: Pigtail catheter remains on the right laterally. Biliary stent in place. There is moderate air in the stomach without gross gastric distention. There is diffuse stool in the colon. There is no small dilatation. No air-fluid level. No free air evident on supine examination. IMPRESSION: Pigtail catheter on the right laterally. Biliary stent in place. Diffuse stool in colon. No bowel obstruction or free air. Electronically Signed   By: Lowella Grip III M.D.   On: 05/23/2020 10:18   DG CHEST PORT 1 VIEW  Result Date:  05/24/2020 CLINICAL DATA:  Shortness of breath EXAM: PORTABLE CHEST 1 VIEW COMPARISON:  05/21/2020 FINDINGS: Right PICC line remains in place, unchanged. Low lung volumes. Patchy bilateral airspace opacities, most pronounced in the left lower lobe, similar to prior study. Heart is normal size. Suspect small left effusion. No pneumothorax. IMPRESSION: Stable patchy bilateral airspace disease, left greater than right with low lung volumes. Suspect small left effusion. Electronically Signed   By: Rolm Baptise M.D.   On: 05/24/2020 09:46    Labs:  CBC: Recent Labs    05/20/20 1834 05/21/20 0418 05/22/20 1337 05/23/20 0455  WBC 26.4* 22.5* 28.8* 23.1*  HGB 10.8* 10.3* 11.1* 10.3*  HCT 34.3* 33.3* 34.7* 33.1*  PLT 233 229 278 235    COAGS: Recent Labs    02/02/20 1902 02/02/20 2115 05/16/20 1905  INR 1.2 1.1 1.3*    BMP: Recent Labs    05/21/20 0418 05/22/20 0424 05/23/20 0455 05/24/20 0405  NA 132* 133* 132* 135  K 3.6 3.9 3.9 3.9  CL 99 100 100 104  CO2 24 24 23 24   GLUCOSE 182* 171* 176* 209*    BUN 15 18 21 20   CALCIUM 8.4* 8.6* 8.5* 8.6*  CREATININE 0.68 0.65 0.58* 0.66  GFRNONAA >60 >60 >60 >60  GFRAA >60 >60 >60 >60    LIVER FUNCTION TESTS: Recent Labs    05/19/20 1214 05/21/20 0418 05/22/20 0424 05/24/20 0405  BILITOT 1.1 0.7 0.6 0.7  AST 24 17 20 20   ALT 29 18 16 17   ALKPHOS 500* 300* 276* 220*  PROT 5.5* 5.2* 5.4* 5.7*  ALBUMIN 1.9* 1.6* 1.6* 1.6*    Assessment and Plan:  Persistent bile leak after cholecystectomy despite biliary stents, drain placement.  Most recent injection showed fistula starting to resolve.  Continue routine drain care and documenting output.  Electronically Signed: Murrell Redden, PA-C 05/24/2020, 2:21 PM    I spent a total of 15 Minutes at the the patient's bedside AND on the patient's hospital floor or unit, greater than 50% of which was counseling/coordinating care for f/u GB fossa drain.

## 2020-05-25 ENCOUNTER — Inpatient Hospital Stay (HOSPITAL_COMMUNITY): Payer: 59

## 2020-05-25 LAB — CBC
HCT: 33.1 % — ABNORMAL LOW (ref 39.0–52.0)
Hemoglobin: 10.3 g/dL — ABNORMAL LOW (ref 13.0–17.0)
MCH: 27.5 pg (ref 26.0–34.0)
MCHC: 31.1 g/dL (ref 30.0–36.0)
MCV: 88.5 fL (ref 80.0–100.0)
Platelets: 263 10*3/uL (ref 150–400)
RBC: 3.74 MIL/uL — ABNORMAL LOW (ref 4.22–5.81)
RDW: 14.7 % (ref 11.5–15.5)
WBC: 23.6 10*3/uL — ABNORMAL HIGH (ref 4.0–10.5)
nRBC: 0 % (ref 0.0–0.2)

## 2020-05-25 LAB — MAGNESIUM: Magnesium: 2.1 mg/dL (ref 1.7–2.4)

## 2020-05-25 LAB — BASIC METABOLIC PANEL
Anion gap: 9 (ref 5–15)
BUN: 20 mg/dL (ref 8–23)
CO2: 24 mmol/L (ref 22–32)
Calcium: 8.6 mg/dL — ABNORMAL LOW (ref 8.9–10.3)
Chloride: 103 mmol/L (ref 98–111)
Creatinine, Ser: 0.58 mg/dL — ABNORMAL LOW (ref 0.61–1.24)
GFR calc Af Amer: >60 mL/min (ref >60)
GFR calc non Af Amer: >60 mL/min (ref >60)
Glucose, Bld: 186 mg/dL — ABNORMAL HIGH (ref 70–99)
Potassium: 4.3 mmol/L (ref 3.5–5.1)
Sodium: 136 mmol/L (ref 135–145)

## 2020-05-25 LAB — GLUCOSE, CAPILLARY
Glucose-Capillary: 181 mg/dL — ABNORMAL HIGH (ref 70–99)
Glucose-Capillary: 202 mg/dL — ABNORMAL HIGH (ref 70–99)
Glucose-Capillary: 208 mg/dL — ABNORMAL HIGH (ref 70–99)
Glucose-Capillary: 234 mg/dL — ABNORMAL HIGH (ref 70–99)
Glucose-Capillary: 250 mg/dL — ABNORMAL HIGH (ref 70–99)
Glucose-Capillary: 288 mg/dL — ABNORMAL HIGH (ref 70–99)

## 2020-05-25 LAB — PHOSPHORUS: Phosphorus: 3.9 mg/dL (ref 2.5–4.6)

## 2020-05-25 MED ORDER — TRAVASOL 10 % IV SOLN
INTRAVENOUS | Status: AC
  Filled 2020-05-25: qty 1320

## 2020-05-25 MED ORDER — ONDANSETRON HCL 4 MG/2ML IJ SOLN
4.0000 mg | Freq: Once | INTRAMUSCULAR | Status: AC
  Administered 2020-05-26: 4 mg via INTRAVENOUS
  Filled 2020-05-25: qty 2

## 2020-05-25 MED ORDER — IOHEXOL 9 MG/ML PO SOLN
500.0000 mL | ORAL | Status: AC
  Administered 2020-05-25: 500 mL via ORAL

## 2020-05-25 MED ORDER — FUROSEMIDE 10 MG/ML IJ SOLN
40.0000 mg | Freq: Two times a day (BID) | INTRAMUSCULAR | Status: AC
  Administered 2020-05-25 – 2020-05-28 (×6): 40 mg via INTRAVENOUS
  Filled 2020-05-25 (×6): qty 4

## 2020-05-25 MED ORDER — TRAVASOL 10 % IV SOLN
INTRAVENOUS | Status: DC
  Filled 2020-05-25: qty 1320

## 2020-05-25 MED ORDER — FUROSEMIDE 10 MG/ML IJ SOLN
40.0000 mg | Freq: Once | INTRAMUSCULAR | Status: AC
  Administered 2020-05-25: 40 mg via INTRAVENOUS
  Filled 2020-05-25: qty 4

## 2020-05-25 MED ORDER — INSULIN ASPART 100 UNIT/ML ~~LOC~~ SOLN
0.0000 [IU] | Freq: Two times a day (BID) | SUBCUTANEOUS | Status: DC
  Administered 2020-05-26: 8 [IU] via SUBCUTANEOUS

## 2020-05-25 NOTE — Progress Notes (Addendum)
PHARMACY - TOTAL PARENTERAL NUTRITION CONSULT NOTE   Indication: severe pancreatitis  Patient Measurements: Height: 5' 9"  (175.3 cm) Weight: 86.3 kg (190 lb 4.1 oz) IBW/kg (Calculated) : 70.7 TPN AdjBW (KG): 86.1 Body mass index is 28.1 kg/m.  Assessment: Admitted 04/26/20. Post-ERCP 8/18 and biliary stent exchange for persistent postoperative bile leak related to recent cholecystectomy with abscess status post percutaneous drainage. Developed acute pancreatitis.  - Prolonged hospitalization complicated by VDRF (extubated 8/24), AKI, encephalopathy, and afib. - 9/11: Still with very poor oral intake. Start TPN for Acute post ERCP pancreatitis/ ileus and persistent biliary leak. Significant gastric fluid retention from functional outlet obstruction. NG to suction  -9/16 advance to FLD, will follow up on PO intake -9/17: IV RN reported rate was not increased correctly (was charted correctly), visual confirmation that bag was not infused at ordered rate. Instructed RN to cont at 136m/hr until ~1063mleft, then decr to 7126mr. Patient with worsening NV, currently only taking ice chips  Glucose / Insulin: A1C 5.6. CBGs 180s-200s. 8 units mSSI used in 24 hours Electrolytes: AdjCa 10.5 high, others wnl   Renal: Scr <1. BUN wnl LFTs / TGs: AST/ALT wnl. Alk phos 220 down trending. TG 146 Prealbumin / albumin: 9/13- Prealbumin 7.2. Albumin 1.6 Intake / Output; MIVF: UOP 0.2mL58m/hr. LBM 05/24/20. NGT out. Drains 550 56/24hr. I/O +375mL30mrs (per chart, incomplete documentation)   GI Imaging: 9/5: CT with signs of sequela of severe pancreatitis 9/7: HIDA with persistent bile leak with accumulations and paracolic gutter. 9/9: CT: pancreatitis (peripancreatic fat stranding and peripancreatic fluid, percutaneous drain at the gallbladder fossa, unchanged biliary stent with no intrahepatic biliary duct dilatation, bilateral pleural effusions.  9/15: KUB with diffuse colonic stool, no bowel  obstruction/free air  Surgeries / Procedures:  8/18: ERCP and biliary stent exchange 9/9: Unsuccessful ERCP 9/10: Paracentesis 1.4L out   GI Meds: Acidophilus 2 daily, Phenergan 12.5mg I47m4h PRN (last dose 9/6), PO PPI/24h, Miralax daily, Dulcolax PRN   Central access: PICC placed 9/11. TPN start date: 9/11  Nutritional Goals (per RD recommendation on 05/18/20): kcal: 2300-2500, Protein: 130-145g, Fluid: >2.3L Goal TPN rate is 100 mL/24hr (provides 132 g of protein and 2342 kcals per day)  Current Nutrition: 9/15- Advanced to FLD - Ensure Enlive PO BID (total: 700kcal, 40g protein) ordered but not given - Magic cup BID w/ meals (580kcal, 18g protein) -9/16 Patient thinks he'll only be able to eat a few spoonfuls of magic cup and cream of wheat plus juices    Plan:  Adjust to cycle TPN over 14 hours at 1800 with a rate of 92mL/h71mr the first and last hour, then at 185mL/hr54mer first hour for 12 hours. (provides 132g AA, 67g ILE, 336g CHO and 2342kcal) +Feeding supplements ordered (provides 1280 kcal, 58g protein) -Meeting >100% of the patient's needs (total kcal 3622, 190g protein) but patient not drinking all supplements F/u PO intake and decrease TPN as able  Electrolytes in TPN: decrease K to 35mEq/L,52mto 6mEq/L, a61mPhos to 4mmol/L an56montinue 145mEq/L of 11mnd 0mEq/L of Ca69mum. Cl:Ac ratio 1:1 Add standard MVI and trace elements to TPN Adjusted frequency of moderate SSI from q8h to q12h AM labs and standard nursing orders  Change meds to po (MVI, Phenergan) when tolerating diet reliably   Janson Lamar RickettRedgie Grayerudent

## 2020-05-25 NOTE — Progress Notes (Addendum)
Daily Rounding Note  05/25/2020, 10:48 AM  LOS: 28 days   SUBJECTIVE:   Chief complaint:   Post ERCP pancreatitis.  Persistent bile leak.   Another bad 24 hours w progressive back pain that causes him to "hurt all over".  However no abdominal pain. Intake of full liquids triggers the back pain, pain triggers SOB.  Miserable.   Last BM was yesterday, formed x 6.  Non overnight.   Got dose of Lasix this AM for LE swelling  OBJECTIVE:         Vital signs in last 24 hours:    Temp:  [97.6 F (36.4 C)-98.9 F (37.2 C)] 98.9 F (37.2 C) (09/17 0802) Pulse Rate:  [89-100] 89 (09/17 0802) Resp:  [15-18] 17 (09/17 0802) BP: (121-139)/(80-86) 138/86 (09/17 0802) SpO2:  [91 %-100 %] 91 % (09/17 0802) Last BM Date: 05/23/20 Filed Weights   05/18/20 0500 05/23/20 0457 05/24/20 0500  Weight: 84.6 kg 85.9 kg 86.3 kg   General: looks unwell, not toxic, uncomfortable, stressed.  Heart: RRR Chest: clear bil.   Abdomen: soft, NT, slight distention.  BS hypoactive  Extremities: pitting pedal edema Neuro/Psych:  Seems stressed, distressed, anxious.  Moves all 4 limbs, no tremors.  Speech, cognition is mildly delayed  Intake/Output from previous day: 09/16 0701 - 09/17 0700 In: 1325.2 [P.O.:720; I.V.:605.2] Out: 950 [Urine:400; Drains:550]  Intake/Output this shift: No intake/output data recorded.  Lab Results: Recent Labs    05/22/20 1337 05/23/20 0455 05/25/20 0359  WBC 28.8* 23.1* 23.6*  HGB 11.1* 10.3* 10.3*  HCT 34.7* 33.1* 33.1*  PLT 278 235 263   BMET Recent Labs    05/23/20 0455 05/24/20 0405 05/25/20 0359  NA 132* 135 136  K 3.9 3.9 4.3  CL 100 104 103  CO2 23 24 24   GLUCOSE 176* 209* 186*  BUN 21 20 20   CREATININE 0.58* 0.66 0.58*  CALCIUM 8.5* 8.6* 8.6*   LFT Recent Labs    05/24/20 0405  PROT 5.7*  ALBUMIN 1.6*  AST 20  ALT 17  ALKPHOS 220*  BILITOT 0.7   PT/INR No results for input(s):  LABPROT, INR in the last 72 hours. Hepatitis Panel No results for input(s): HEPBSAG, HCVAB, HEPAIGM, HEPBIGM in the last 72 hours.  Studies/Results: DG Lumbar Spine 2-3 Views  Result Date: 05/25/2020 CLINICAL DATA:  Ongoing back pain. EXAM: LUMBAR SPINE - 2-3 VIEW COMPARISON:  05/02/2020 FINDINGS: Normal alignment. Negative for fracture or mass. No bone lesion identified. Mild disc degeneration with mild anterior spurring L2-3 and L3-4. Biliary stent noted.  Pigtail catheter right upper quadrant. IMPRESSION: Mild lumbar degenerative change.  No acute abnormality. Electronically Signed   By: Franchot Gallo M.D.   On: 05/25/2020 09:34   DG CHEST PORT 1 VIEW  Result Date: 05/24/2020 CLINICAL DATA:  Shortness of breath EXAM: PORTABLE CHEST 1 VIEW COMPARISON:  05/21/2020 FINDINGS: Right PICC line remains in place, unchanged. Low lung volumes. Patchy bilateral airspace opacities, most pronounced in the left lower lobe, similar to prior study. Heart is normal size. Suspect small left effusion. No pneumothorax. IMPRESSION: Stable patchy bilateral airspace disease, left greater than right with low lung volumes. Suspect small left effusion. Electronically Signed   By: Rolm Baptise M.D.   On: 05/24/2020 09:46    ASSESMENT:   *Persistent post chole bile leak despite bile duct stenting. Long term GB fossa drain in place.   * Post ERCP pancreatitis, severe.    *  GOO due to pancreatitis. NGT came out 9/14.  No GOO on KUB 9/15.   Tolerating   * Dyspnea.   This morning's portable CXR shows patchy, stable bilateral airspace disease greater on the left, low lung volumes, suspicion for small left pleural effusion.   Had been on Meropenem  8/27 - 9/11.   WBCs 28.8 >> 23.1.   Room air sats today in the mid to upper 90s.  * Stable Pan[place] anemia.  * Hyponatremia.   * Low back pain, severe.  xray shows mild degenerative change lumbar spinal changes.    *   IDDM.    *     Anxiety, depression.  Continues on his home Zoloft. Depression anxiety overlay not well controlled   *    Significant stool output in the last 24 hours after close to 2 weeks without significant stooling.  Received 10 mg of p.o. Dulcolax at 1 PM, once daily MiraLAX.    PLAN   *  Last CTAP was 9/9, will repeat today given lack of progress and the back pain.    *  Pain mgt per Dr [place] but pt is requesting scheduled Dilaudid.      Azucena Freed  05/25/2020, 10:48 AM Phone [phone]   Attending physician's note   I have taken an interval history, reviewed the chart and examined the patient. I agree with the Advanced Practitioner's note, impression and recommendations.   Complains of worsening back pain, he is not tolerating full diet well  We will repeat CT abdomen and pelvis to evaluate for any new fluid collections or worsening pancreatitis or recurrent gastric outlet obstruction  He feels current pain regimen is not adequate, requesting additional medications.  Pain management as per primary team/Dr.[place]  Will switch to clear liquid diet  Persistent bile leak, last ERCP September 9 failed, unable to maneuver the scope beyond duodenal bulb due to gastric outlet obstruction due to duodenitis in the setting of pancreatitis.  Prior to that ERCP with stent placement August 18 complicated by post ERCP pancreatitis.  Unclear what options are available at this point, will request Dr. Rush Landmark to present him at multidisciplinary meeting to discuss his case  I have spent 35 minutes of patient care (this includes precharting, chart review, review of results, face-to-face time used for counseling as well as treatment plan and follow-up. The patient was provided an opportunity to ask questions and all were answered. The patient agreed with the plan and demonstrated an understanding of the instructions.  Damaris Hippo , MD [phone]

## 2020-05-25 NOTE — Plan of Care (Signed)
  Problem: Education: Goal: Knowledge of General Education information will improve Description: Including pain rating scale, medication(s)/side effects and non-pharmacologic comfort measures Outcome: Progressing   Problem: Health Behavior/Discharge Planning: Goal: Ability to manage health-related needs will improve Outcome: Progressing   Problem: Clinical Measurements: Goal: Ability to maintain clinical measurements within normal limits will improve Outcome: Progressing Goal: Will remain free from infection Outcome: Progressing Goal: Diagnostic test results will improve Outcome: Progressing Goal: Respiratory complications will improve Outcome: Progressing Goal: Cardiovascular complication will be avoided Outcome: Progressing   Problem: Activity: Goal: Risk for activity intolerance will decrease Outcome: Progressing   Problem: Nutrition: Goal: Adequate nutrition will be maintained Outcome: Progressing   Problem: Coping: Goal: Level of anxiety will decrease Outcome: Progressing   Problem: Elimination: Goal: Will not experience complications related to bowel motility Outcome: Progressing Goal: Will not experience complications related to urinary retention Outcome: Progressing   Problem: Pain Managment: Goal: General experience of comfort will improve Outcome: Progressing   Problem: Safety: Goal: Ability to remain free from injury will improve Outcome: Progressing   Problem: Skin Integrity: Goal: Risk for impaired skin integrity will decrease Outcome: Progressing   Problem: Education: Goal: Knowledge of disease or condition will improve Outcome: Progressing Goal: Understanding of medication regimen will improve Outcome: Progressing Goal: Individualized Educational Video(s) Outcome: Progressing   Problem: Activity: Goal: Ability to tolerate increased activity will improve Outcome: Progressing   Problem: Cardiac: Goal: Ability to achieve and maintain  adequate cardiopulmonary perfusion will improve Outcome: Progressing   Problem: Health Behavior/Discharge Planning: Goal: Ability to safely manage health-related needs after discharge will improve Outcome: Progressing   Problem: Activity: Goal: Ability to tolerate increased activity will improve Outcome: Progressing   Problem: Respiratory: Goal: Ability to maintain a clear airway and adequate ventilation will improve Outcome: Progressing   Problem: Role Relationship: Goal: Method of communication will improve Outcome: Progressing

## 2020-05-25 NOTE — Progress Notes (Signed)
PROGRESS NOTE    Adam Fowler  WGN:[id number] DOB: [birth year] DOA: 04/26/2020 PCP: Melony Overly, MD    Brief Narrative:  Adam Fowler is a 64 year old male with past medical history notable for essential hypertension who underwent ERCP on 04/25/2020 for biliary stent exchange for persistent postoperative bile leak related to recent cholecystectomy with abscess status post percutaneous drain in which he developed severe epigastric abdominal pain associate with nausea, vomiting.  CT abdomen/pelvis notable for changes consistent with acute pancreatitis without abscess or drainable fluid collection likely related to postoperative ERCP pancreatitis.  8/21-8/24: Rapid response for increased work of breathing and atrial fibrillation requiring invasive mechanical ventilation.  Treated for volume overload and aspiration pneumonia with antibiotic therapy with Zosyn, liberated from mechanical ventilation on 05/01/2020 and transition to nasal cannula.  8/25-9/3: Continues with leukocytosis, abdominal pain with repeat CT abdomen/pelvis with markedly diffuse pancreatic inflammation with fat stranding and free fluid.  Zosyn changed to meropenem on 8/27 and placed on tube feeds via NG tube for several days.  9/3-9/7: NG tube removed, patient was tolerating oral nutritional support.  But continue with high output biliary drain despite stent in appropriate location and stable liver enzymes.  Repeat CT abdomen/pelvis 05/13/2020 with signs/sequela of severe pancreatitis and stable positioning of internal biliary stents with no intrahepatic biliary duct dilation.  HIDA scan 05/15/2020 with persistent bile leak accumulating the lateral aspect of the right upper quadrant, paracolic gutter, patient with poor oral intake.  9/9: Unsuccessful ERCP, unable to pass beyond D1/D2 sweep, patient with significant gastric fluid retention related to functional outlet obstruction, NG tube reinserted and placed on intermittent suction.   Repeat abdomen/pelvis 9/9 with pancreatitis, pain peripancreatic fat stranding and peripancreatic fluid, percutaneous drain at the gallbladder fossa, unchanged blurry stent with no intrahepatic bladder dilation, bilateral pleural effusions.  9/10: PICC line placed for initiation of TPN.  9/14: NG tube removed accidentally by patient.  Had a bowel movement overnight.  9/15: Abdominal pain improved, NG tube removed overnight accidentally.  Had a bowel movement.  GI advancing to full liquid diet.  9/16: Patient complaining of shortness of breath overnight, improved with breathing treatment.  Currently oxygenating well on room air.  X-ray with possible small left pleural effusion and patchy airspace disease, otherwise unrevealing.  With bowel movement yesterday.  GI plans to continue full liquid diet with TPN today.  Continue to monitor output from drain.  9/17: Patient uncomfortable in appearance, continues with significant abdominal distention.  No bowel movements overnight, but 1 reported yesterday.  Not tolerating full liquid diet as much.  Complains of low back pain, review of EMR shows has only used 3 doses of IV Dilaudid past 24 hours and last dose of oral oxycodone on 05/23/2020.  Also complains of some shortness of breath with notable lower extremity edema.  Chest x-ray yesterday with diffuse mild diffuse patchy infiltrates.  Will check x-ray lumbar spine, give dose IV Lasix.  GI transitioning diet back to full liquids due to in toleration of full liquids.  Patient not progressing.  GI to present at multidisciplinary rounds.  May need NG tube replaced vs reevaluation by surgery.   Assessment & Plan:   Principal Problem:   Post-ERCP acute pancreatitis Active Problems:   Severe sepsis (HCC)   Polycythemia   AKI (acute kidney injury) ([place])   Metabolic acidosis   Acute respiratory failure with hypoxia (HCC)   Bilateral pleural effusion   Ileus (HCC)   Bile duct leak   Gastric outlet  obstruction   Abdominal distention   ARDS (adult respiratory distress syndrome) (HCC)   Biloma following surgery   Acute post ERCP pancreatitis/ileus Bile leak following cholecystectomy, persistent Patient presenting following increased abdominal pain following ERCP.  Lipase 1379.  CT abdomen/pelvis notable for pancreatitis.  Previous cholecystectomy with bile leak that has persisted despite bile duct stenting.  Gallbladder fossa drain in place placed by IR on 02/13/2020. --[facility] following, appreciate assistance --General surgery signed off 05/18/2020, no surgical intervention required at this time --NG tube pulled out on 05/22/2020, diet advanced to full liquids by GI on 9/15, and now return to clear liquid diet today --Continue TPN with pharmacy monitoring/dosing --Continue to monitor gallbladder fossa drain output, 550 mL out p24h  Acute hypoxic respiratory failure: Resolved Bilateral pleural effusions On 04/28/2020, patient developed severe respiratory distress, rapid response patient was transferred to the ICU with mechanical ventilation.  Etiology volume overload with aspiration pneumonia.  Was successfully extubated on 05/01/2020.  Completed course of antibiotics with vancomycin, Zosyn and meropenem.  Supplemental oxygen has been titrated off and oxygenating well on room air.  CXR 9/16 with stable patchy bilateral airspace disease, left greater than right with suspicion of small left pleural effusion. --Lasix 40 mg IV x1 today --Supportive care  Acute renal failure: Resolved Patient presenting with creatinine 1.41.  Etiology likely poor oral intake coupled with acute pancreatitis as above.  Patient was treated with IV fluid hydration, now supported with TPN with further advancement of diet.  Now resolved with creatinine 0.58.  Hypokalemia Hypomagnesemia Repleted, currently on TPN.  Potassium 4.3 today --Continue to follow electrolytes closely daily  Paroxysmal atrial  fibrillation, transient: Resolved Spontaneously converted to normal sinus rhythm, will CHA2DS2-VASc score = 1, low risk of thrombosis.  Telemetry has been discontinued.  Chronic pain syndrome: On oxycodone 10 mg every 4 hours as needed for pain at home.  On x-ray 05/25/2020 with mild lumbar degenerative change, no acute abnormality. --Tylenol 650 mg every 6 hours scheduled --OxyContin 10 mg p.o. every 12 hours --Lidocaine patch --OxyIR, 5 mg every 4 hours as needed for moderate breakthrough pain; last used 9/15 at 1821 --Dilaudid 1 mg IV every 3 hours as needed severe breakthrough pain, used 3 doses past 24 hours --Will need to slowly titrate down narcotic burden  Anemia of chronic disease Etiology likely secondary to malnutrition.  Hemoglobin 10.3, stable. --Continue to monitor hemoglobin, transfuse for hemoglobin less than 7.0.  Essential hypertension On lisinopril and HCTZ at home.  BP 139/86. --Continue to hold home antihypertensives for now  Dyslipidemia: Continue pravastatin  Hyperglycemia Hemoglobin A1c 5.6. --Continue insulin therapy per TPN protocol  Moderate calorie protein malnutrition Nutrition Status: Nutrition Problem: Inadequate oral intake Etiology: acute illness Signs/Symptoms: NPO status Interventions: TPN, Ensure Enlive (each supplement provides 350kcal and 20 grams of protein) --Continue TPN, pharmacy consulted for assistance dosing/monitoring --Continue full liquid diet per GI today  Depression/anxiety: Continue sertraline and trazodone   DVT prophylaxis: Lovenox Code Status: Full code Family Communication: Updated patient extensively at bedside  Disposition Plan:  Status is: Inpatient  Remains inpatient appropriate because:Ongoing active pain requiring inpatient pain management, Ongoing diagnostic testing needed not appropriate for outpatient work up, Unsafe d/c plan, IV treatments appropriate due to intensity of illness or inability to take PO and  Inpatient level of care appropriate due to severity of illness continues on clear liquid diet, remains on TPN   Dispo:  Patient From:  Home  Planned Disposition: Home  Expected discharge date: 05/28/20  Medically stable for discharge:  No    Consultants:   [facility]  General surgery -signed off 05/18/2020  Procedures:   ERCP 9/9  Antimicrobials:   Zosyn 8/19-8/26  Meropenem 8/27-9/11  Vancomycin 8/22-8/23   Subjective: Patient seen and examined bedside, uncomfortable in appearance.  Continues with chronic low back pain, unable to get into a comfortable position.  Discouraged at lack of progress.  Also complains of shortness of breath, although oxygenating well on room air.  Continues to report flatus, no bowel movement overnight but 1 bowel movement yesterday.  GI placing back on a clear liquid diet.  Is not used intermediate acting oxycodone since 9/15.  Continues on OxyContin twice daily, lidocaine patch and has used 3 doses of Dilaudid over the past 24 hours. No other complaints or concerns at this time.  Denies headache, no visual changes, no chest pain, no palpitations, no nausea/vomiting, no diarrhea, no fever/chills/night sweats, no weakness, no fatigue.  No acute events overnight per nursing staff.  Objective: Vitals:   05/24/20 1629 05/24/20 2108 05/25/20 0419 05/25/20 0802  BP: 133/83 121/80 139/86 138/86  Pulse: 96 100 100 89  Resp: 16 15 18 17   Temp: 98.1 F (36.7 C) 98.2 F (36.8 C) 97.6 F (36.4 C) 98.9 F (37.2 C)  TempSrc: Oral Oral Oral Oral  SpO2:  100% 93% 91%  Weight:      Height:        Intake/Output Summary (Last 24 hours) at 05/25/2020 1459 Last data filed at 05/25/2020 1400 Gross per 24 hour  Intake 1565.2 ml  Output 1700 ml  Net -134.8 ml   Filed Weights   05/18/20 0500 05/23/20 0457 05/24/20 0500  Weight: 84.6 kg 85.9 kg 86.3 kg    Examination:  General exam: Appears calm and comfortable, chronically  ill appearance Respiratory system: Clear to auscultation. Respiratory effort normal.  Oxygenating well on room air Cardiovascular system: S1 & S2 heard, RRR. No JVD, murmurs, rubs, gallops or clicks.  1+ pedal edema bilateral lower extremities to mid shin Gastrointestinal system: Abdomen is distended, soft and nontender. No organomegaly or masses felt.  Very faint high-pitched bowel sounds noted Central nervous system: Alert and oriented. No focal neurological deficits. Extremities: Symmetric 5 x 5 power. Skin: No rashes, lesions or ulcers Psychiatry: Judgement and insight appear normal.  Depressed mood & flat affect.    Data Reviewed: I have personally reviewed following labs and imaging studies  CBC: Recent Labs  Lab 05/19/20 1214 05/19/20 1214 05/20/20 1834 05/21/20 0418 05/22/20 1337 05/23/20 0455 05/25/20 0359  WBC 22.4*   < > 26.4* 22.5* 28.8* 23.1* 23.6*  NEUTROABS 19.8*  --   --  19.3* 24.1* 18.4*  --   HGB 11.8*   < > 10.8* 10.3* 11.1* 10.3* 10.3*  HCT 37.6*   < > 34.3* 33.3* 34.7* 33.1* 33.1*  MCV 88.7   < > 86.8 89.0 87.6 87.6 88.5  PLT 306   < > 233 229 278 235 263   < > = values in this interval not displayed.   Basic Metabolic Panel: Recent Labs  Lab 05/21/20 0418 05/22/20 0424 05/23/20 0455 05/24/20 0405 05/25/20 0359  NA 132* 133* 132* 135 136  K 3.6 3.9 3.9 3.9 4.3  CL 99 100 100 104 103  CO2 24 24 23 24 24   GLUCOSE 182* 171* 176* 209* 186*  BUN 15 18 21 20 20   CREATININE 0.68 0.65 0.58* 0.66 0.58*  CALCIUM 8.4* 8.6*  8.5* 8.6* 8.6*  MG 1.9 1.8 1.9 2.0 2.1  PHOS 3.0 3.2 3.2 3.7 3.9   GFR: Estimated Creatinine Clearance: 101.5 mL/min (A) (by C-G formula based on SCr of 0.58 mg/dL (L)). Liver Function Tests: Recent Labs  Lab 05/19/20 1214 05/21/20 0418 05/22/20 0424 05/24/20 0405  AST 24 17 20 20   ALT 29 18 16 17   ALKPHOS 500* 300* 276* 220*  BILITOT 1.1 0.7 0.6 0.7  PROT 5.5* 5.2* 5.4* 5.7*  ALBUMIN 1.9* 1.6* 1.6* 1.6*   No results for  input(s): LIPASE, AMYLASE in the last 168 hours. No results for input(s): AMMONIA in the last 168 hours. Coagulation Profile: No results for input(s): INR, PROTIME in the last 168 hours. Cardiac Enzymes: No results for input(s): CKTOTAL, CKMB, CKMBINDEX, TROPONINI in the last 168 hours. BNP (last 3 results) No results for input(s): PROBNP in the last 8760 hours. HbA1C: No results for input(s): HGBA1C in the last 72 hours. CBG: Recent Labs  Lab 05/24/20 2017 05/25/20 0051 05/25/20 0412 05/25/20 0805 05/25/20 1153  GLUCAP 182* 208* 181* 202* 234*   Lipid Profile: No results for input(s): CHOL, HDL, LDLCALC, TRIG, CHOLHDL, LDLDIRECT in the last 72 hours. Thyroid Function Tests: No results for input(s): TSH, T4TOTAL, FREET4, T3FREE, THYROIDAB in the last 72 hours. Anemia Panel: No results for input(s): VITAMINB12, FOLATE, FERRITIN, TIBC, IRON, RETICCTPCT in the last 72 hours. Sepsis Labs: No results for input(s): PROCALCITON, LATICACIDVEN in the last 168 hours.  Recent Results (from the past 240 hour(s))  Culture, body fluid-bottle     Status: None   Collection Time: 05/18/20  5:13 PM   Specimen: Fluid  Result Value Ref Range Status   Specimen Description FLUID PERITONEAL  Final   Special Requests BOTTLES DRAWN AEROBIC AND ANAEROBIC  Final   Culture   Final    NO GROWTH 5 DAYS Performed at [facility], [place] [address] [place], [place] [address]    Report Status 05/23/2020 FINAL  Final  Gram stain     Status: None   Collection Time: 05/18/20  5:13 PM   Specimen: Fluid  Result Value Ref Range Status   Specimen Description FLUID PERITONEAL  Final   Special Requests NONE  Final   Gram Stain   Final    WBC PRESENT, PREDOMINANTLY MONONUCLEAR NO ORGANISMS SEEN CYTOSPIN SMEAR Performed at [facility], [place] [address] [place], [place] [address]    Report Status 05/19/2020 FINAL  Final         Radiology Studies: DG Lumbar Spine 2-3 Views  Result Date:  05/25/2020 CLINICAL DATA:  Ongoing back pain. EXAM: LUMBAR SPINE - 2-3 VIEW COMPARISON:  05/02/2020 FINDINGS: Normal alignment. Negative for fracture or mass. No bone lesion identified. Mild disc degeneration with mild anterior spurring L2-3 and L3-4. Biliary stent noted.  Pigtail catheter right upper quadrant. IMPRESSION: Mild lumbar degenerative change.  No acute abnormality. Electronically Signed   By: Franchot Gallo M.D.   On: 05/25/2020 09:34   DG CHEST PORT 1 VIEW  Result Date: 05/24/2020 CLINICAL DATA:  Shortness of breath EXAM: PORTABLE CHEST 1 VIEW COMPARISON:  05/21/2020 FINDINGS: Right PICC line remains in place, unchanged. Low lung volumes. Patchy bilateral airspace opacities, most pronounced in the left lower lobe, similar to prior study. Heart is normal size. Suspect small left effusion. No pneumothorax. IMPRESSION: Stable patchy bilateral airspace disease, left greater than right with low lung volumes. Suspect small left effusion. Electronically Signed   By: Lennette Bihari  [place] M.D.   On: 05/24/2020 09:46      Scheduled Meds: . acetaminophen  650 mg Oral Q6H  . acidophilus  2 capsule Oral Daily  . Chlorhexidine Gluconate Cloth  6 each Topical Daily  . enoxaparin (LOVENOX) injection  40 mg Subcutaneous Q24H  . feeding supplement (ENSURE ENLIVE)  237 mL Oral BID BM  . influenza vac split quadrivalent PF  0.5 mL Intramuscular Tomorrow-1000  . insulin aspart  0-15 Units Subcutaneous Q12H  . mouth rinse  15 mL Mouth Rinse BID  . oxyCODONE  10 mg Oral Q12H  . pantoprazole  40 mg Oral Daily  . polyethylene glycol  17 g Oral Daily  . pravastatin  20 mg Oral q1800  . sertraline  100 mg Oral Daily  . sodium chloride flush  10-40 mL Intracatheter Q12H  . traZODone  50 mg Oral QHS   Continuous Infusions: . sodium chloride Stopped (05/07/20 0923)  . TPN CYCLIC-ADULT (ION)       LOS: 28 days    Time spent: 39 minutes spent on chart review, discussion with nursing staff, consultants,  updating family and interview/physical exam; more than 50% of that time was spent in counseling and/or coordination of care.    Jenia Klepper J [place] DO [facility] Available via Epic secure chat 7am-7pm After these hours, please refer to coverage provider listed on [email or web address] 05/25/2020, 2:59 PM

## 2020-05-25 NOTE — Plan of Care (Signed)
  Problem: Education: Goal: Knowledge of General Education information will improve Description Including pain rating scale, medication(s)/side effects and non-pharmacologic comfort measures Outcome: Progressing   

## 2020-05-25 NOTE — Progress Notes (Addendum)
Nutrition Follow-up  DOCUMENTATION CODES:   Not applicable  INTERVENTION:   -TPN management per pharmacy -D/c Ensure Enlive po BID, each supplement provides 350 kcal and 20 grams of protein -RD will follow for diet advancement and adjust supplement regimen as appropriate  NUTRITION DIAGNOSIS:   Inadequate oral intake related to acute illness as evidenced by NPO status.  Ongoing  GOAL:   Patient will meet greater than or equal to 90% of their needs  Met with TPN  MONITOR:   PO intake, Supplement acceptance, Diet advancement, Labs, Weight trends, Skin, I & O's  REASON FOR ASSESSMENT:   Ventilator    ASSESSMENT:   64 year old male with PMH of HTN, ERCP on 8/18 s/p biliary stent exchange for persistent postoperative bile leak after recent cholecystectomy with abscess s/- percutaneous drainage. CT abdomen/pelvis showing acute pancreatitis. Pt admitted with severe sepsis secondary to post-ERCP pancreatitis.  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 No family at bedside. RD did not disturb.   Pt with very minimal intake, reporting PO intake exacerbates back pain. He is refusing supplements. Per GI notes, plan to revert to clear liquids.   Pt remains on TPN- receiving cyclic TPN over 18 hours, which provides 2342 kcals and 132 grams protein, meeting 100% of estimated kcal and protein needs. Per pharmacy notes, plan to adjust TPN cycle to 14 hours at 1800l, which will provide 2342 kcals and 132 grams protein, meeting 100% of estimated kcal and protein needs.   Medications reviewed and include miralax.   Labs reviewed: CBGS: 202-234 (inpatient orders for glycemic control are 0-15 units insulin aspart every 12 hours).   Diet Order:   Diet Order            Diet full liquid Room service appropriate? Yes; Fluid consistency: Thin  Diet effective now                 EDUCATION NEEDS:   Education needs have been addressed  Skin:  Skin Assessment: Skin Integrity Issues: Skin Integrity Issues:: Incisions Incisions: closed abdomen  Last BM:  05/22/20  Height:   Ht Readings from Last 1 Encounters:  05/02/20 $RemoveB'5\' 9"'lEsRVagV$  (1.753 m)    Weight:   Wt Readings from Last 1 Encounters:  05/24/20 86.3 kg    Ideal Body Weight:  72.7 kg  BMI:  Body mass index is 28.1 kg/m.  Estimated Nutritional Needs:   Kcal:  2300-2500  Protein:  130-145 grams  Fluid:  > 2.3 L  Loistine Chance, RD, LDN, [place] Registered Dietitian II Certified Diabetes Care and Education Specialist Please refer to [facility] for RD and/or RD on-call/weekend/after hours pager

## 2020-05-26 ENCOUNTER — Inpatient Hospital Stay (HOSPITAL_COMMUNITY): Payer: 59

## 2020-05-26 LAB — GLUCOSE, CAPILLARY
Glucose-Capillary: 139 mg/dL — ABNORMAL HIGH (ref 70–99)
Glucose-Capillary: 301 mg/dL — ABNORMAL HIGH (ref 70–99)
Glucose-Capillary: 305 mg/dL — ABNORMAL HIGH (ref 70–99)
Glucose-Capillary: 314 mg/dL — ABNORMAL HIGH (ref 70–99)

## 2020-05-26 LAB — CBC
HCT: 34.4 % — ABNORMAL LOW (ref 39.0–52.0)
Hemoglobin: 10.4 g/dL — ABNORMAL LOW (ref 13.0–17.0)
MCH: 27.4 pg (ref 26.0–34.0)
MCHC: 30.2 g/dL (ref 30.0–36.0)
MCV: 90.5 fL (ref 80.0–100.0)
Platelets: 320 10*3/uL (ref 150–400)
RBC: 3.8 MIL/uL — ABNORMAL LOW (ref 4.22–5.81)
RDW: 14.9 % (ref 11.5–15.5)
WBC: 29.6 10*3/uL — ABNORMAL HIGH (ref 4.0–10.5)
nRBC: 0 % (ref 0.0–0.2)

## 2020-05-26 LAB — BASIC METABOLIC PANEL
Anion gap: 9 (ref 5–15)
BUN: 28 mg/dL — ABNORMAL HIGH (ref 8–23)
CO2: 28 mmol/L (ref 22–32)
Calcium: 8.3 mg/dL — ABNORMAL LOW (ref 8.9–10.3)
Chloride: 104 mmol/L (ref 98–111)
Creatinine, Ser: 0.61 mg/dL (ref 0.61–1.24)
GFR calc Af Amer: >60 mL/min (ref >60)
GFR calc non Af Amer: >60 mL/min (ref >60)
Glucose, Bld: 320 mg/dL — ABNORMAL HIGH (ref 70–99)
Potassium: 4.1 mmol/L (ref 3.5–5.1)
Sodium: 141 mmol/L (ref 135–145)

## 2020-05-26 LAB — PHOSPHORUS: Phosphorus: 3.6 mg/dL (ref 2.5–4.6)

## 2020-05-26 LAB — MAGNESIUM: Magnesium: 2.1 mg/dL (ref 1.7–2.4)

## 2020-05-26 MED ORDER — IOHEXOL 9 MG/ML PO SOLN
500.0000 mL | ORAL | Status: AC
  Administered 2020-05-26: 500 mL via ORAL

## 2020-05-26 MED ORDER — IOHEXOL 300 MG/ML  SOLN
100.0000 mL | Freq: Once | INTRAMUSCULAR | Status: AC | PRN
  Administered 2020-05-26: 100 mL via INTRAVENOUS

## 2020-05-26 MED ORDER — OXYCODONE HCL ER 10 MG PO T12A
20.0000 mg | EXTENDED_RELEASE_TABLET | Freq: Two times a day (BID) | ORAL | Status: DC
  Administered 2020-05-26 – 2020-06-03 (×16): 20 mg via ORAL
  Filled 2020-05-26 (×2): qty 1
  Filled 2020-05-26: qty 2
  Filled 2020-05-26: qty 1
  Filled 2020-05-26 (×2): qty 2
  Filled 2020-05-26 (×3): qty 1
  Filled 2020-05-26: qty 2
  Filled 2020-05-26: qty 1
  Filled 2020-05-26: qty 2
  Filled 2020-05-26 (×3): qty 1
  Filled 2020-05-26: qty 2

## 2020-05-26 MED ORDER — TRAVASOL 10 % IV SOLN
INTRAVENOUS | Status: AC
  Filled 2020-05-26: qty 1322.2

## 2020-05-26 MED ORDER — INSULIN ASPART 100 UNIT/ML ~~LOC~~ SOLN
0.0000 [IU] | SUBCUTANEOUS | Status: DC
  Administered 2020-05-26: 11 [IU] via SUBCUTANEOUS
  Administered 2020-05-26: 2 [IU] via SUBCUTANEOUS
  Administered 2020-05-26 – 2020-05-27 (×2): 11 [IU] via SUBCUTANEOUS
  Administered 2020-05-27 (×2): 2 [IU] via SUBCUTANEOUS
  Administered 2020-05-28: 5 [IU] via SUBCUTANEOUS
  Administered 2020-05-28: 8 [IU] via SUBCUTANEOUS

## 2020-05-26 MED ORDER — IOHEXOL 9 MG/ML PO SOLN: 500.0000 mL | ORAL | Status: DC

## 2020-05-26 MED ORDER — INSULIN ASPART 100 UNIT/ML ~~LOC~~ SOLN: 0.0000 [IU] | SUBCUTANEOUS | Status: DC

## 2020-05-26 NOTE — Progress Notes (Addendum)
PROGRESS NOTE    Adam Fowler  LXB:[id number] DOB: 08-08-1956 DOA: 04/26/2020 PCP: Melony Overly, MD    Brief Narrative:  Adam Fowler is a 64 year old male with past medical history notable for essential hypertension who underwent ERCP on 04/25/2020 for biliary stent exchange for persistent postoperative bile leak related to recent cholecystectomy with abscess status post percutaneous drain in which he developed severe epigastric abdominal pain associate with nausea, vomiting.  CT abdomen/pelvis notable for changes consistent with acute pancreatitis without abscess or drainable fluid collection likely related to postoperative ERCP pancreatitis.  8/21-8/24: Rapid response for increased work of breathing and atrial fibrillation requiring invasive mechanical ventilation.  Treated for volume overload and aspiration pneumonia with antibiotic therapy with Zosyn, liberated from mechanical ventilation on 05/01/2020 and transition to nasal cannula.  8/25-9/3: Continues with leukocytosis, abdominal pain with repeat CT abdomen/pelvis with markedly diffuse pancreatic inflammation with fat stranding and free fluid.  Zosyn changed to meropenem on 8/27 and placed on tube feeds via NG tube for several days.  9/3-9/7: NG tube removed, patient was tolerating oral nutritional support.  But continue with high output biliary drain despite stent in appropriate location and stable liver enzymes.  Repeat CT abdomen/pelvis 05/13/2020 with signs/sequela of severe pancreatitis and stable positioning of internal biliary stents with no intrahepatic biliary duct dilation.  HIDA scan 05/15/2020 with persistent bile leak accumulating the lateral aspect of the right upper quadrant, paracolic gutter, patient with poor oral intake.  9/9: Unsuccessful ERCP, unable to pass beyond D1/D2 sweep, patient with significant gastric fluid retention related to functional outlet obstruction, NG tube reinserted and placed on intermittent suction.   Repeat abdomen/pelvis 9/9 with pancreatitis, pain peripancreatic fat stranding and peripancreatic fluid, percutaneous drain at the gallbladder fossa, unchanged blurry stent with no intrahepatic bladder dilation, bilateral pleural effusions.  9/10: PICC line placed for initiation of TPN.  9/14: NG tube removed accidentally by patient.  Had a bowel movement overnight.  9/15: Abdominal pain improved, NG tube removed overnight accidentally.  Had a bowel movement.  GI advancing to full liquid diet.  9/16: Patient complaining of shortness of breath overnight, improved with breathing treatment.  Currently oxygenating well on room air.  X-ray with possible small left pleural effusion and patchy airspace disease, otherwise unrevealing.  With bowel movement yesterday.  GI plans to continue full liquid diet with TPN today.  Continue to monitor output from drain.  9/17: Patient uncomfortable in appearance, continues with significant abdominal distention.  No bowel movements overnight, but 1 reported yesterday.  Not tolerating full liquid diet as much.  Complains of low back pain, review of EMR shows has only used 3 doses of IV Dilaudid past 24 hours and last dose of oral oxycodone on 05/23/2020.  Also complains of some shortness of breath with notable lower extremity edema.  Chest x-ray yesterday with diffuse mild diffuse patchy infiltrates.  Will check x-ray lumbar spine, give dose IV Lasix.  GI transitioning diet back to full liquids due to in toleration of full liquids.  Patient not progressing.  GI to present at multidisciplinary rounds.  May need NG tube replaced vs reevaluation by surgery.  9/18: Patient continues to complain of of low back pain radiating up his upper back. X-ray L-spine yesterday with mild degenerative changes, otherwise unrevealing. States pain is not controlled with oral medications, although has only used 1 dose of OxyIR on 9/15 and 9/17, and has utilized Dilaudid on four occasions over the  past 24 hours when it is  ordered every 3 hours. Continues with significant abdominal distention, no appreciable bowel sounds. Episode of hematemesis overnight. Still awaiting CT abdomen/pelvis which was ordered yesterday afternoon, still not performed. May need to reinsert NG tube for decompression. Await further recs per GI. Will increase OxyContin to 20 mg p.o. twice daily.   Assessment & Plan:   Principal Problem:   Post-ERCP acute pancreatitis Active Problems:   Severe sepsis (HCC)   Polycythemia   AKI (acute kidney injury) ([place])   Metabolic acidosis   Acute respiratory failure with hypoxia (HCC)   Bilateral pleural effusion   Ileus (HCC)   Bile duct leak   Gastric outlet obstruction   Abdominal distention   ARDS (adult respiratory distress syndrome) ([place])   Biloma following surgery   Acute post ERCP pancreatitis/ileus Bile leak following cholecystectomy, persistent Patient presenting following increased abdominal pain following ERCP.  Lipase 1379.  CT abdomen/pelvis notable for pancreatitis.  Previous cholecystectomy with bile leak that has persisted despite bile duct stenting.  Gallbladder fossa drain in place placed by IR on 02/13/2020. --[facility] following, appreciate assistance --General surgery signed off 05/18/2020, no surgical intervention required at this time --NG tube pulled out on 05/22/2020, diet advanced to full liquids by GI on 9/15, and now return to clear liquid diet 9/17 --Continue TPN with pharmacy monitoring/dosing --Continue to monitor gallbladder fossa drain output, output not recorded in chart past 24 hours --Repeat CT abdomen/pelvis with contrast: Pending --Anticipate likely need for replacement of NG tube for decompression; will await further GI recommendations  Acute hypoxic respiratory failure: Resolved Bilateral pleural effusions On 04/28/2020, patient developed severe respiratory distress, rapid response patient was transferred to the ICU with  mechanical ventilation.  Etiology volume overload with aspiration pneumonia.  Was successfully extubated on 05/01/2020.  Completed course of antibiotics with vancomycin, Zosyn and meropenem.  Supplemental oxygen has been titrated off and oxygenating well on room air.  CXR 9/16 with stable patchy bilateral airspace disease, left greater than right with suspicion of small left pleural effusion. --Lasix 40 mg IV q12h --Strict I's and O's and daily weights --Supportive care  Acute renal failure: Resolved Patient presenting with creatinine 1.41.  Etiology likely poor oral intake coupled with acute pancreatitis as above.  Patient was treated with IV fluid hydration, now supported with TPN with further advancement of diet.  Now resolved with creatinine 0.61.  Hypokalemia Hypomagnesemia Repleted, currently on TPN.  Potassium 4.1 today --Continue to follow electrolytes closely daily  Paroxysmal atrial fibrillation, transient: Resolved Spontaneously converted to normal sinus rhythm, will CHA2DS2-VASc score = 1, low risk of thrombosis.  Telemetry has been discontinued.  Chronic pain syndrome: On oxycodone 10 mg every 4 hours as needed for pain at home.  On x-ray 05/25/2020 with mild lumbar degenerative change, no acute abnormality. --Tylenol 650 mg every 6 hours scheduled --Increase OxyContin to 20 mg p.o. every 12 hours --Lidocaine patch --OxyIR, 5 mg every 4 hours as needed for moderate breakthrough pain; last used 9/15 at 1821 and 9/17 at 1618 --Dilaudid 1 mg IV q3h prn severe breakthrough pain, used 4 doses past 24 hours  Anemia of chronic disease Etiology likely secondary to malnutrition.  Hemoglobin 10.3, stable. --Continue to monitor hemoglobin, transfuse for hemoglobin less than 7.0.  Essential hypertension On lisinopril and HCTZ at home.  --Continue to hold home antihypertensives for now  Dyslipidemia: Continue pravastatin  Hyperglycemia Hemoglobin A1c 5.6. --Continue insulin therapy  per TPN protocol and moderate sliding scale  Moderate calorie protein malnutrition Nutrition Status:  Nutrition Problem: Inadequate oral intake Etiology: acute illness Signs/Symptoms: NPO status Interventions: TPN, Ensure Enlive (each supplement provides 350kcal and 20 grams of protein) --Continue TPN, pharmacy consulted for assistance dosing/monitoring --Continue clear liquid diet per GI   Depression/anxiety: Continue sertraline and trazodone   DVT prophylaxis: Lovenox Code Status: Full code Family Communication: Updated patient extensively at bedside  Disposition Plan:  Status is: Inpatient  Remains inpatient appropriate because:Ongoing active pain requiring inpatient pain management, Ongoing diagnostic testing needed not appropriate for outpatient work up, Unsafe d/c plan, IV treatments appropriate due to intensity of illness or inability to take PO and Inpatient level of care appropriate due to severity of illness continues on clear liquid diet, remains on TPN   Dispo:  Patient From:  Home  Planned Disposition: Home  Expected discharge date: 05/28/20  Medically stable for discharge:  No   Consultants:   [facility]  Interventional radiology  General surgery -signed off 05/18/2020  Procedures:   ERCP 9/9  Antimicrobials:   Zosyn 8/19-8/26  Meropenem 8/27-9/11  Vancomycin 8/22-8/23   Subjective: Patient seen and examined bedside, continues to be uncomfortable in appearance. Frustrated that has been here for 1 month. States back pain continues in his lower back and radiates up to his upper back. Continues to report flatus, no bowel movement overnight but 1 bowel movement yesterday. Also episode of hematemesis overnight. Discussed will increase OxyContin to 20 mg p.o. twice daily, has not been utilizing OxyIR/Dilaudid in terms of allowed frequency. No other complaints or concerns at this time.  Denies headache, no visual changes, no chest pain, no palpitations, no  nausea/vomiting, no diarrhea, no fever/chills/night sweats, no weakness, no fatigue.  No acute events overnight per nursing staff.  Objective: Vitals:   05/25/20 1955 05/26/20 0348 05/26/20 0500 05/26/20 0755  BP: (!) 170/94 (!) 151/71  128/87  Pulse: (!) 105 (!) 110  (!) 106  Resp: 15 16  18   Temp: 97.6 F (36.4 C) 98.8 F (37.1 C)  98.4 F (36.9 C)  TempSrc: Oral Oral  Oral  SpO2: 95% 90%  93%  Weight:   84.6 kg   Height:        Intake/Output Summary (Last 24 hours) at 05/26/2020 1105 Last data filed at 05/25/2020 1700 Gross per 24 hour  Intake 960 ml  Output 750 ml  Net 210 ml   Filed Weights   05/23/20 0457 05/24/20 0500 05/26/20 0500  Weight: 85.9 kg 86.3 kg 84.6 kg    Examination:  General exam: Appears calm and comfortable, chronically ill appearance Respiratory system: Clear to auscultation. Respiratory effort normal.  Oxygenating well on room air Cardiovascular system: S1 & S2 heard, RRR. No JVD, murmurs, rubs, gallops or clicks.  1+ pedal edema bilateral lower extremities to mid shin Gastrointestinal system: Abdomen is distended, soft and nontender. No organomegaly or masses felt. Bowel sounds absent Central nervous system: Alert and oriented. No focal neurological deficits. Extremities: Symmetric 5 x 5 power. Skin: No rashes, lesions or ulcers Psychiatry: Judgement and insight appear normal.  Depressed mood & flat affect.    Data Reviewed: I have personally reviewed following labs and imaging studies  CBC: Recent Labs  Lab 05/19/20 1214 05/19/20 1214 05/20/20 1834 05/21/20 0418 05/22/20 1337 05/23/20 0455 05/25/20 0359  WBC 22.4*   < > 26.4* 22.5* 28.8* 23.1* 23.6*  NEUTROABS 19.8*  --   --  19.3* 24.1* 18.4*  --   HGB 11.8*   < > 10.8* 10.3* 11.1* 10.3* 10.3*  HCT 37.6*   < > 34.3* 33.3* 34.7* 33.1* 33.1*  MCV 88.7   < > 86.8 89.0 87.6 87.6 88.5  PLT 306   < > 233 229 278 235 263   < > = values in this interval not displayed.   Basic Metabolic  Panel: Recent Labs  Lab 05/22/20 0424 05/23/20 0455 05/24/20 0405 05/25/20 0359 05/26/20 0349  NA 133* 132* 135 136 141  K 3.9 3.9 3.9 4.3 4.1  CL 100 100 104 103 104  CO2 24 23 24 24 28   GLUCOSE 171* 176* 209* 186* 320*  BUN 18 21 20 20  28*  CREATININE 0.65 0.58* 0.66 0.58* 0.61  CALCIUM 8.6* 8.5* 8.6* 8.6* 8.3*  MG 1.8 1.9 2.0 2.1 2.1  PHOS 3.2 3.2 3.7 3.9 3.6   GFR: Estimated Creatinine Clearance: 93.3 mL/min (by C-G formula based on SCr of 0.61 mg/dL). Liver Function Tests: Recent Labs  Lab 05/19/20 1214 05/21/20 0418 05/22/20 0424 05/24/20 0405  AST 24 17 20 20   ALT 29 18 16 17   ALKPHOS 500* 300* 276* 220*  BILITOT 1.1 0.7 0.6 0.7  PROT 5.5* 5.2* 5.4* 5.7*  ALBUMIN 1.9* 1.6* 1.6* 1.6*   No results for input(s): LIPASE, AMYLASE in the last 168 hours. No results for input(s): AMMONIA in the last 168 hours. Coagulation Profile: No results for input(s): INR, PROTIME in the last 168 hours. Cardiac Enzymes: No results for input(s): CKTOTAL, CKMB, CKMBINDEX, TROPONINI in the last 168 hours. BNP (last 3 results) No results for input(s): PROBNP in the last 8760 hours. HbA1C: No results for input(s): HGBA1C in the last 72 hours. CBG: Recent Labs  Lab 05/25/20 1153 05/25/20 1702 05/25/20 1956 05/25/20 2358 05/26/20 0750  GLUCAP 234* 250* 288* 305* 301*   Lipid Profile: No results for input(s): CHOL, HDL, LDLCALC, TRIG, CHOLHDL, LDLDIRECT in the last 72 hours. Thyroid Function Tests: No results for input(s): TSH, T4TOTAL, FREET4, T3FREE, THYROIDAB in the last 72 hours. Anemia Panel: No results for input(s): VITAMINB12, FOLATE, FERRITIN, TIBC, IRON, RETICCTPCT in the last 72 hours. Sepsis Labs: No results for input(s): PROCALCITON, LATICACIDVEN in the last 168 hours.  Recent Results (from the past 240 hour(s))  Culture, body fluid-bottle     Status: None   Collection Time: 05/18/20  5:13 PM   Specimen: Fluid  Result Value Ref Range Status   Specimen  Description FLUID PERITONEAL  Final   Special Requests BOTTLES DRAWN AEROBIC AND ANAEROBIC  Final   Culture   Final    NO GROWTH 5 DAYS Performed at [facility], [place] [address] [place], [place] [address]    Report Status 05/23/2020 FINAL  Final  Gram stain     Status: None   Collection Time: 05/18/20  5:13 PM   Specimen: Fluid  Result Value Ref Range Status   Specimen Description FLUID PERITONEAL  Final   Special Requests NONE  Final   Gram Stain   Final    WBC PRESENT, PREDOMINANTLY MONONUCLEAR NO ORGANISMS SEEN CYTOSPIN SMEAR Performed at [facility], [place] [address], [place], [place] [address]    Report Status 05/19/2020 FINAL  Final         Radiology Studies: DG Lumbar Spine 2-3 Views  Result Date: 05/25/2020 CLINICAL DATA:  Ongoing back pain. EXAM: LUMBAR SPINE - 2-3 VIEW COMPARISON:  05/02/2020 FINDINGS: Normal alignment. Negative for fracture or mass. No bone lesion identified. Mild disc degeneration with mild anterior spurring L2-3 and L3-4. Biliary stent  noted.  Pigtail catheter right upper quadrant. IMPRESSION: Mild lumbar degenerative change.  No acute abnormality. Electronically Signed   By: Franchot Gallo M.D.   On: 05/25/2020 09:34      Scheduled Meds: . acetaminophen  650 mg Oral Q6H  . acidophilus  2 capsule Oral Daily  . Chlorhexidine Gluconate Cloth  6 each Topical Daily  . enoxaparin (LOVENOX) injection  40 mg Subcutaneous Q24H  . furosemide  40 mg Intravenous BID  . influenza vac split quadrivalent PF  0.5 mL Intramuscular Tomorrow-1000  . insulin aspart  0-15 Units Subcutaneous 4 times per day  . mouth rinse  15 mL Mouth Rinse BID  . oxyCODONE  20 mg Oral Q12H  . pantoprazole  40 mg Oral Daily  . polyethylene glycol  17 g Oral Daily  . pravastatin  20 mg Oral q1800  . sertraline  100 mg Oral Daily  . sodium chloride flush  10-40 mL Intracatheter Q12H  . traZODone  50 mg Oral QHS   Continuous Infusions: . sodium chloride Stopped  (05/07/20 0923)  . TPN CYCLIC-ADULT (ION)       LOS: 29 days    Time spent: 39 minutes spent on chart review, discussion with nursing staff, consultants, updating family and interview/physical exam; more than 50% of that time was spent in counseling and/or coordination of care.    Imelda Dandridge J [place] DO [facility] Available via Epic secure chat 7am-7pm After these hours, please refer to coverage provider listed on [email or web address] 05/26/2020, 11:05 AM

## 2020-05-26 NOTE — Progress Notes (Signed)
PHARMACY - TOTAL PARENTERAL NUTRITION CONSULT NOTE   Indication: severe pancreatitis  Patient Measurements: Height: 5' 9"  (175.3 cm) Weight: 84.6 kg (186 lb 8.2 oz) IBW/kg (Calculated) : 70.7 TPN AdjBW (KG): 86.1 Body mass index is 27.54 kg/m.  Assessment: 6 yom admitted 04/26/20, post-ERCP 8/18 and biliary stent exchange for persistent postoperative bile leak related to recent cholecystectomy with abscess s/p percutaneous drainage. Patient developed acute pancreatitis, has had a prolonged hospitalization complicated by VDRF (extubated 8/24), AKI, encephalopathy, and afib. TPN initiated 9/11 for acute post-ERCP pancreatitis/ileus and persistent biliary leak. Significant gastric fluid retention from functional outlet obstruction. Pt still with very poor PO intake.  9/15 - Advanced to FLD 9/17 - Pt reports minimal PO intake exacerbates severe back pain, refusing PO supplements 9/18 - Large coffee-ground emesis overnight, given phenergan  Glucose / Insulin: A1C 5.6. CBGs now elevated in 200-300s after SSI reduced to q12h yesterday and TPN titrated from 18-hr to 140hr cycle. 8 units mSSI utilized in last 24 hrs Electrolytes: corr Ca high-normal ~10.2; others WNL Renal: Scr <1 stable, BUN up to 28. Lasix 70m IV BID ordered 9/17 x 2 days LFTs / TGs: LFTs / Tbili / TG WNL. Alk phos elevated but trending down Prealbumin / albumin: Prealbumin down to 7.2. Albumin 1.6 Intake / Output; MIVF: UOP 0.483mkg/hr per documentation. LBM 9/17 (significant stool output noted per GI 9/17 after no significant stool for 2 week). Drain output down to 250 out/24hr. I/O +1.4L this admit GI Imaging: 9/5: CT - severe pancreatitis 9/7: HIDA - persistent bile leak with accumulations/paracolic gutter 9/9: CT - pancreatitis (peripancreatic fat stranding and peripancreatic fluid, percutaneous drain at the gallbladder fossa, unchanged biliary stent with no intrahepatic biliary duct dilatation, bilateral pleural  effusions 9/15: KUB - diffuse colonic stool, no bowel obstruction/free air 9/17: X-ray lumbar spine - no acute abnormality 9/18: CT abd/pelvis - pending Surgeries / Procedures: 8/18: ERCP/biliary stent exchange 9/9: Unsuccessful ERCP 9/10: Paracentesis - 1.4L out  Central access: PICC placed 05/19/20 TPN start date: 05/19/20  Nutritional Goals (per RD recommendation on 05/18/20): kCal: 2300-2500, Protein: 130-145g, Fluid: >2.3L/day Goal TPN rate is 100 mL/24hr (provides 132 g of protein and 2342 kcals per day)  Current Nutrition: TPN 9/17 - Revert back to CLD (although not updated in Epic), d/c PO supplemements  Plan:  Continue 1411-XByclic TPN at 182620 unable to titrate further to 12-hr cycle due to uncontrolled CBGs. Hesitant to add insulin to TPN today with changes to diet, PO supplements d/c'd, plus SSI reduced to q12h yesterday. Will f/u toleration of TPN with adjusted SSI 9/19 and adjust as appropriate.  Cycle 2404 mL over 14 hours - 92 mL/hr x 1 hr; then 185 mL/hr x 12 hrs; then 92 mL/hr x 1 hr TPN will provide 132g AA, 67g ILE, and 337g CHO, for total 2347 kCal, meeting 100% of patient needs. Electrolytes in TPN: reduce Na to 12075mL; otherwise continue same today - K 57m20m, Mag 6mEq63m Phos 4mmol71m and Ca 0mEq/L77ml:Ac 1:1 Add standard MVI and trace elements to TPN Change moderate SSI to custom cyclic 4x schedule and adjust as needed F/u TPN labs, GI plans, toleration of PO diet and ability to wean TPN Change IV meds to PO when tolerating diet reliably   Floretta Petro vArturo MortonD, BCPS Please check AMION for all [facility] numbers Clinical Pharmacist 05/26/2020 7:51 AM

## 2020-05-26 NOTE — Plan of Care (Signed)
  Problem: Education: Goal: Knowledge of General Education information will improve Description: Including pain rating scale, medication(s)/side effects and non-pharmacologic comfort measures Outcome: Progressing   Problem: Health Behavior/Discharge Planning: Goal: Ability to manage health-related needs will improve Outcome: Progressing   Problem: Clinical Measurements: Goal: Ability to maintain clinical measurements within normal limits will improve Outcome: Progressing Goal: Will remain free from infection Outcome: Progressing Goal: Diagnostic test results will improve Outcome: Progressing Goal: Respiratory complications will improve Outcome: Progressing Goal: Cardiovascular complication will be avoided Outcome: Progressing   Problem: Coping: Goal: Level of anxiety will decrease Outcome: Progressing   Problem: Elimination: Goal: Will not experience complications related to bowel motility Outcome: Progressing Goal: Will not experience complications related to urinary retention Outcome: Progressing   Problem: Pain Managment: Goal: General experience of comfort will improve Outcome: Progressing   Problem: Safety: Goal: Ability to remain free from injury will improve Outcome: Progressing   Problem: Education: Goal: Knowledge of disease or condition will improve Outcome: Progressing Goal: Understanding of medication regimen will improve Outcome: Progressing Goal: Individualized Educational Video(s) Outcome: Progressing   Problem: Activity: Goal: Ability to tolerate increased activity will improve Outcome: Progressing

## 2020-05-26 NOTE — Progress Notes (Signed)
Large coffee ground emesis- phenergan was administered at 2000 nothing else to give at this time

## 2020-05-27 ENCOUNTER — Inpatient Hospital Stay (HOSPITAL_COMMUNITY): Payer: 59

## 2020-05-27 LAB — CBC
HCT: 31.1 % — ABNORMAL LOW (ref 39.0–52.0)
Hemoglobin: 9.6 g/dL — ABNORMAL LOW (ref 13.0–17.0)
MCH: 27.9 pg (ref 26.0–34.0)
MCHC: 30.9 g/dL (ref 30.0–36.0)
MCV: 90.4 fL (ref 80.0–100.0)
Platelets: 332 10*3/uL (ref 150–400)
RBC: 3.44 MIL/uL — ABNORMAL LOW (ref 4.22–5.81)
RDW: 15.2 % (ref 11.5–15.5)
WBC: 25.5 10*3/uL — ABNORMAL HIGH (ref 4.0–10.5)
nRBC: 0 % (ref 0.0–0.2)

## 2020-05-27 LAB — BASIC METABOLIC PANEL
Anion gap: 9 (ref 5–15)
BUN: 37 mg/dL — ABNORMAL HIGH (ref 8–23)
CO2: 31 mmol/L (ref 22–32)
Calcium: 8.4 mg/dL — ABNORMAL LOW (ref 8.9–10.3)
Chloride: 102 mmol/L (ref 98–111)
Creatinine, Ser: 0.75 mg/dL (ref 0.61–1.24)
GFR calc Af Amer: >60 mL/min (ref >60)
GFR calc non Af Amer: >60 mL/min (ref >60)
Glucose, Bld: 344 mg/dL — ABNORMAL HIGH (ref 70–99)
Potassium: 4.2 mmol/L (ref 3.5–5.1)
Sodium: 142 mmol/L (ref 135–145)

## 2020-05-27 LAB — TROPONIN I (HIGH SENSITIVITY)
Troponin I (High Sensitivity): 15 ng/L (ref <18)
Troponin I (High Sensitivity): 13 ng/L (ref <18)

## 2020-05-27 LAB — MAGNESIUM: Magnesium: 2.1 mg/dL (ref 1.7–2.4)

## 2020-05-27 LAB — GLUCOSE, CAPILLARY
Glucose-Capillary: 330 mg/dL — ABNORMAL HIGH (ref 70–99)
Glucose-Capillary: 140 mg/dL — ABNORMAL HIGH (ref 70–99)

## 2020-05-27 LAB — PHOSPHORUS: Phosphorus: 3.9 mg/dL (ref 2.5–4.6)

## 2020-05-27 MED ORDER — LORAZEPAM 0.5 MG PO TABS
0.5000 mg | ORAL_TABLET | Freq: Four times a day (QID) | ORAL | Status: DC | PRN
  Administered 2020-05-27 – 2020-05-30 (×5): 0.5 mg via ORAL
  Filled 2020-05-27 (×5): qty 1

## 2020-05-27 MED ORDER — PANTOPRAZOLE SODIUM 40 MG IV SOLR
40.0000 mg | Freq: Two times a day (BID) | INTRAVENOUS | Status: DC
  Administered 2020-05-27 – 2020-06-10 (×28): 40 mg via INTRAVENOUS
  Filled 2020-05-27 (×28): qty 40

## 2020-05-27 MED ORDER — TRAVASOL 10 % IV SOLN
INTRAVENOUS | Status: AC
  Filled 2020-05-27: qty 1322.2

## 2020-05-27 NOTE — Plan of Care (Signed)
  Problem: Education: Goal: Knowledge of General Education information will improve Description: Including pain rating scale, medication(s)/side effects and non-pharmacologic comfort measures Outcome: Progressing   Problem: Health Behavior/Discharge Planning: Goal: Ability to manage health-related needs will improve Outcome: Progressing   Problem: Clinical Measurements: Goal: Ability to maintain clinical measurements within normal limits will improve Outcome: Progressing Goal: Will remain free from infection Outcome: Progressing Goal: Diagnostic test results will improve Outcome: Progressing Goal: Respiratory complications will improve Outcome: Progressing Goal: Cardiovascular complication will be avoided Outcome: Progressing   Problem: Nutrition: Goal: Adequate nutrition will be maintained Outcome: Progressing   Problem: Coping: Goal: Level of anxiety will decrease Outcome: Progressing   Problem: Elimination: Goal: Will not experience complications related to bowel motility Outcome: Progressing Goal: Will not experience complications related to urinary retention Outcome: Progressing   Problem: Pain Managment: Goal: General experience of comfort will improve Outcome: Progressing   Problem: Safety: Goal: Ability to remain free from injury will improve Outcome: Progressing   Problem: Skin Integrity: Goal: Risk for impaired skin integrity will decrease Outcome: Progressing   Problem: Education: Goal: Knowledge of disease or condition will improve Outcome: Progressing Goal: Understanding of medication regimen will improve Outcome: Progressing Goal: Individualized Educational Video(s) Outcome: Progressing

## 2020-05-27 NOTE — Progress Notes (Signed)
PROGRESS NOTE    Adam Fowler  LXB:[id number] DOB: 08-08-1956 DOA: 04/26/2020 PCP: Melony Overly, MD    Brief Narrative:  Adam Fowler is a 64 year old male with past medical history notable for essential hypertension who underwent ERCP on 04/25/2020 for biliary stent exchange for persistent postoperative bile leak related to recent cholecystectomy with abscess status post percutaneous drain in which he developed severe epigastric abdominal pain associate with nausea, vomiting.  CT abdomen/pelvis notable for changes consistent with acute pancreatitis without abscess or drainable fluid collection likely related to postoperative ERCP pancreatitis.  8/21-8/24: Rapid response for increased work of breathing and atrial fibrillation requiring invasive mechanical ventilation.  Treated for volume overload and aspiration pneumonia with antibiotic therapy with Zosyn, liberated from mechanical ventilation on 05/01/2020 and transition to nasal cannula.  8/25-9/3: Continues with leukocytosis, abdominal pain with repeat CT abdomen/pelvis with markedly diffuse pancreatic inflammation with fat stranding and free fluid.  Zosyn changed to meropenem on 8/27 and placed on tube feeds via NG tube for several days.  9/3-9/7: NG tube removed, patient was tolerating oral nutritional support.  But continue with high output biliary drain despite stent in appropriate location and stable liver enzymes.  Repeat CT abdomen/pelvis 05/13/2020 with signs/sequela of severe pancreatitis and stable positioning of internal biliary stents with no intrahepatic biliary duct dilation.  HIDA scan 05/15/2020 with persistent bile leak accumulating the lateral aspect of the right upper quadrant, paracolic gutter, patient with poor oral intake.  9/9: Unsuccessful ERCP, unable to pass beyond D1/D2 sweep, patient with significant gastric fluid retention related to functional outlet obstruction, NG tube reinserted and placed on intermittent suction.   Repeat abdomen/pelvis 9/9 with pancreatitis, pain peripancreatic fat stranding and peripancreatic fluid, percutaneous drain at the gallbladder fossa, unchanged blurry stent with no intrahepatic bladder dilation, bilateral pleural effusions.  9/10: PICC line placed for initiation of TPN.  9/14: NG tube removed accidentally by patient.  Had a bowel movement overnight.  9/15: Abdominal pain improved, NG tube removed overnight accidentally.  Had a bowel movement.  GI advancing to full liquid diet.  9/16: Patient complaining of shortness of breath overnight, improved with breathing treatment.  Currently oxygenating well on room air.  X-ray with possible small left pleural effusion and patchy airspace disease, otherwise unrevealing.  With bowel movement yesterday.  GI plans to continue full liquid diet with TPN today.  Continue to monitor output from drain.  9/17: Patient uncomfortable in appearance, continues with significant abdominal distention.  No bowel movements overnight, but 1 reported yesterday.  Not tolerating full liquid diet as much.  Complains of low back pain, review of EMR shows has only used 3 doses of IV Dilaudid past 24 hours and last dose of oral oxycodone on 05/23/2020.  Also complains of some shortness of breath with notable lower extremity edema.  Chest x-ray yesterday with diffuse mild diffuse patchy infiltrates.  Will check x-ray lumbar spine, give dose IV Lasix.  GI transitioning diet back to full liquids due to in toleration of full liquids.  Patient not progressing.  GI to present at multidisciplinary rounds.  May need NG tube replaced vs reevaluation by surgery.  9/18: Patient continues to complain of of low back pain radiating up his upper back. X-ray L-spine yesterday with mild degenerative changes, otherwise unrevealing. States pain is not controlled with oral medications, although has only used 1 dose of OxyIR on 9/15 and 9/17, and has utilized Dilaudid on four occasions over the  past 24 hours when it is  ordered every 3 hours. Continues with significant abdominal distention, no appreciable bowel sounds. Episode of hematemesis overnight. Still awaiting CT abdomen/pelvis which was ordered yesterday afternoon, still not performed. May need to reinsert NG tube for decompression. Await further recs per GI. Will increase OxyContin to 20 mg p.o. twice daily.  9/19: Patient continues with abdominal discomfort, distention and multiple emesis over the past 24 hours.  Repeat CT yesterday continues to demonstrate gastric outlet obstruction.  Discussed with GI, Dr. Silverio Decamp; and no other recommendations at this time, awaiting further recommendations of Dr. Mansouraty/Cirigliano will return to service tomorrow, and ultimately may need surgical intervention.  Will reinsert NG tube in place to low wall suction, return to n.p.o. status.   Assessment & Plan:   Principal Problem:   Post-ERCP acute pancreatitis Active Problems:   Severe sepsis (HCC)   Polycythemia   AKI (acute kidney injury) ([place])   Metabolic acidosis   Acute respiratory failure with hypoxia (HCC)   Bilateral pleural effusion   Ileus (HCC)   Bile duct leak   Gastric outlet obstruction   Abdominal distention   ARDS (adult respiratory distress syndrome) ([place])   Biloma following surgery   Acute post ERCP pancreatitis/ileus Bile leak following cholecystectomy, persistent Patient presenting following increased abdominal pain following ERCP.  Lipase 1379.  CT abdomen/pelvis notable for pancreatitis.  Previous cholecystectomy with bile leak that has persisted despite bile duct stenting.  Gallbladder fossa drain in place placed by IR on 02/13/2020. Repeat CT A//P w/ contrast 9/18 with moderate peripancreatic fluid unchanged, organized fluid likely representing pseudocyst less likely abscess, pigtail drainage catheter in gallbladder fossa, common bile duct stent unchanged, moderate ascites. --[facility] [facility] following, appreciate  assistance --General surgery signed off 05/18/2020, no surgical intervention required at this time --NG tube pulled out on 05/22/2020, diet advanced to full liquids by GI on 9/15, and not tolerating with return of emesis; replace NGT to [facility] 9/19 --Continue TPN with pharmacy monitoring/dosing --Continue to monitor gallbladder fossa drain output, 94mL past 24 hours --May need surgical intervention from advanced hepatobiliary team given lack of progress over the past 31 days, will await further GI recommendations  Acute hypoxic respiratory failure: Resolved Bilateral pleural effusions On 04/28/2020, patient developed severe respiratory distress, rapid response patient was transferred to the ICU with mechanical ventilation.  Etiology volume overload with aspiration pneumonia.  Was successfully extubated on 05/01/2020.  Completed course of antibiotics with vancomycin, Zosyn and meropenem.  Supplemental oxygen has been titrated off and oxygenating well on room air.  CXR 9/16 with stable patchy bilateral airspace disease, left greater than right with suspicion of small left pleural effusion. --Continue Lasix 40 mg IV q12h --Strict I's and O's and daily weights --Supportive care  Acute renal failure: Resolved Patient presenting with creatinine 1.41.  Etiology likely poor oral intake coupled with acute pancreatitis as above.  Patient was treated with IV fluid hydration, now supported with TPN with further advancement of diet.  Now resolved with creatinine 0.75.  Hypokalemia Hypomagnesemia Repleted, currently on TPN.  Potassium 4.2 today --Continue to follow electrolytes closely daily  Paroxysmal atrial fibrillation, transient: Resolved Spontaneously converted to normal sinus rhythm, will CHA2DS2-VASc score = 1, low risk of thrombosis.  Telemetry has been discontinued.  Chronic pain syndrome: On oxycodone 10 mg every 4 hours as needed for pain at home.  On x-ray 05/25/2020 with mild lumbar degenerative  change, no acute abnormality. --Tylenol 650 mg every 6 hours scheduled --Increased OxyContin to 20 mg p.o. q12h on 9/18 --  Lidocaine patch --OxyIR, 5 mg q4h prn for moderate breakthrough pain --Dilaudid 1 mg IV q3h prn severe breakthrough pain  Anemia of chronic disease Etiology likely secondary to malnutrition.  Hemoglobin 10.3, stable. --Continue to monitor hemoglobin, transfuse for hemoglobin less than 7.0.  Essential hypertension On lisinopril and HCTZ at home.  --Continue to hold home antihypertensives for now  Dyslipidemia: Continue pravastatin  Hyperglycemia Hemoglobin A1c 5.6. --Continue insulin therapy per TPN protocol and moderate sliding scale --CBGs Q4h  Moderate calorie protein malnutrition Nutrition Status: Nutrition Problem: Inadequate oral intake Etiology: acute illness Signs/Symptoms: NPO status Interventions: TPN, Ensure Enlive (each supplement provides 350kcal and 20 grams of protein) --Continue TPN, pharmacy consulted for assistance dosing/monitoring --Resume NPO 9/19  Depression/anxiety: Continue sertraline and trazodone   DVT prophylaxis: Lovenox Code Status: Full code Family Communication: Updated patient extensively at bedside  Disposition Plan:  Status is: Inpatient  Remains inpatient appropriate because:Ongoing active pain requiring inpatient pain management, Ongoing diagnostic testing needed not appropriate for outpatient work up, Unsafe d/c plan, IV treatments appropriate due to intensity of illness or inability to take PO and Inpatient level of care appropriate due to severity of illness continues on clear liquid diet, remains on TPN   Dispo:  Patient From:  Home  Planned Disposition: Home  Expected discharge date: 05/28/20  Medically stable for discharge:  No   Consultants:   [facility]  Interventional radiology  General surgery - signed off 05/18/2020  Procedures:   ERCP 9/9  Antimicrobials:   Zosyn 8/19-8/26  Meropenem  8/27 - 9/11  Vancomycin 8/22 - 8/23   Subjective: Patient seen and examined bedside, continues to be uncomfortable in appearance.  Multiple episodes of emesis overnight.  Continues with significant abdominal discomfort/distention, back pain, chest discomfort.  EKG performed this morning shows sinus tachycardia without any concerning ST elevation/depression or T wave inversion.  Troponin XV, within normal limits.  Chest x-ray with left pleural effusion, bilateral basal dependent opacities, unchanged.  Gust with GI this morning, given lack of bowel sounds, multiple episodes of emesis, will replace NG tube to low wall suction and remain n.p.o. Dr. Silverio Decamp has messaged her GI partners and general surgery regarding his lack of progress; and await further recommendations.  No other complaints or concerns at this time.  Denies headache, no visual changes, no palpitations, no diarrhea, no fever/chills/night sweats.  No other acute events overnight per nursing staff.  Objective: Vitals:   05/26/20 1949 05/27/20 0453 05/27/20 0759 05/27/20 0810  BP: (!) 148/95 (!) 145/89 124/85 129/83  Pulse: (!) 101 (!) 110 80 (!) 110  Resp: 14 15 18    Temp: (!) 97.4 F (36.3 C) 97.6 F (36.4 C) 98.4 F (36.9 C) 97.9 F (36.6 C)  TempSrc: Oral Oral Oral Oral  SpO2: 90% 90% 96% 96%  Weight:      Height:        Intake/Output Summary (Last 24 hours) at 05/27/2020 1225 Last data filed at 05/27/2020 1005 Gross per 24 hour  Intake --  Output 1600 ml  Net -1600 ml   Filed Weights   05/23/20 0457 05/24/20 0500 05/26/20 0500  Weight: 85.9 kg 86.3 kg 84.6 kg    Examination:  General exam: Uncomfortable in appearance, anxious, chronically ill appearance Respiratory system: Clear to auscultation. Respiratory effort normal.  Oxygenating well on room air Cardiovascular system: S1 & S2 heard, RRR. No JVD, murmurs, rubs, gallops or clicks.  1+ pedal edema bilateral lower extremities to mid shin Gastrointestinal  system: Abdomen  is distended, with mild global tenderness. No organomegaly or masses felt. Bowel sounds absent Central nervous system: Alert and oriented. No focal neurological deficits. Extremities: Symmetric 5 x 5 power. Skin: No rashes, lesions or ulcers Psychiatry: Judgement and insight appear normal.  Depressed mood & flat affect.    Data Reviewed: I have personally reviewed following labs and imaging studies  CBC: Recent Labs  Lab 05/21/20 0418 05/21/20 0418 05/22/20 1337 05/23/20 0455 05/25/20 0359 05/26/20 1630 05/27/20 0355  WBC 22.5*   < > 28.8* 23.1* 23.6* 29.6* 25.5*  NEUTROABS 19.3*  --  24.1* 18.4*  --   --   --   HGB 10.3*   < > 11.1* 10.3* 10.3* 10.4* 9.6*  HCT 33.3*   < > 34.7* 33.1* 33.1* 34.4* 31.1*  MCV 89.0   < > 87.6 87.6 88.5 90.5 90.4  PLT 229   < > 278 235 263 320 332   < > = values in this interval not displayed.   Basic Metabolic Panel: Recent Labs  Lab 05/23/20 0455 05/24/20 0405 05/25/20 0359 05/26/20 0349 05/27/20 0355  NA 132* 135 136 141 142  K 3.9 3.9 4.3 4.1 4.2  CL 100 104 103 104 102  CO2 23 24 24 28 31   GLUCOSE 176* 209* 186* 320* 344*  BUN 21 20 20  28* 37*  CREATININE 0.58* 0.66 0.58* 0.61 0.75  CALCIUM 8.5* 8.6* 8.6* 8.3* 8.4*  MG 1.9 2.0 2.1 2.1 2.1  PHOS 3.2 3.7 3.9 3.6 3.9   GFR: Estimated Creatinine Clearance: 93.3 mL/min (by C-G formula based on SCr of 0.75 mg/dL). Liver Function Tests: Recent Labs  Lab 05/21/20 0418 05/22/20 0424 05/24/20 0405  AST 17 20 20   ALT 18 16 17   ALKPHOS 300* 276* 220*  BILITOT 0.7 0.6 0.7  PROT 5.2* 5.4* 5.7*  ALBUMIN 1.6* 1.6* 1.6*   No results for input(s): LIPASE, AMYLASE in the last 168 hours. No results for input(s): AMMONIA in the last 168 hours. Coagulation Profile: No results for input(s): INR, PROTIME in the last 168 hours. Cardiac Enzymes: No results for input(s): CKTOTAL, CKMB, CKMBINDEX, TROPONINI in the last 168 hours. BNP (last 3 results) No results for input(s):  PROBNP in the last 8760 hours. HbA1C: No results for input(s): HGBA1C in the last 72 hours. CBG: Recent Labs  Lab 05/25/20 2358 05/26/20 0750 05/26/20 1425 05/26/20 2316 05/27/20 0758  GLUCAP 305* 301* 139* 314* 330*   Lipid Profile: No results for input(s): CHOL, HDL, LDLCALC, TRIG, CHOLHDL, LDLDIRECT in the last 72 hours. Thyroid Function Tests: No results for input(s): TSH, T4TOTAL, FREET4, T3FREE, THYROIDAB in the last 72 hours. Anemia Panel: No results for input(s): VITAMINB12, FOLATE, FERRITIN, TIBC, IRON, RETICCTPCT in the last 72 hours. Sepsis Labs: No results for input(s): PROCALCITON, LATICACIDVEN in the last 168 hours.  Recent Results (from the past 240 hour(s))  Culture, body fluid-bottle     Status: None   Collection Time: 05/18/20  5:13 PM   Specimen: Fluid  Result Value Ref Range Status   Specimen Description FLUID PERITONEAL  Final   Special Requests BOTTLES DRAWN AEROBIC AND ANAEROBIC  Final   Culture   Final    NO GROWTH 5 DAYS Performed at [facility], [place] [address] [place],  [place] [address]    Report Status 05/23/2020 FINAL  Final  Gram stain     Status: None   Collection Time: 05/18/20  5:13 PM   Specimen: Fluid  Result Value Ref Range  Status   Specimen Description FLUID PERITONEAL  Final   Special Requests NONE  Final   Gram Stain   Final    WBC PRESENT, PREDOMINANTLY MONONUCLEAR NO ORGANISMS SEEN CYTOSPIN SMEAR Performed at [facility], [place] [address] [place], [place] [address]    Report Status 05/19/2020 FINAL  Final         Radiology Studies: CT ABDOMEN PELVIS W CONTRAST  Result Date: 05/26/2020 CLINICAL DATA:  Low back pain.  Possible acute pancreatitis. EXAM: CT ABDOMEN AND PELVIS WITH CONTRAST TECHNIQUE: Multidetector CT imaging of the abdomen and pelvis was performed using the standard protocol following bolus administration of intravenous contrast. CONTRAST:  153mL OMNIPAQUE IOHEXOL 300 MG/ML  SOLN COMPARISON:   05/17/2020 FINDINGS: Lower chest: Persistent bibasilar opacification with slight interval improvement which may be due to improving atelectasis or infection. Small to moderate size left pleural effusion without significant change. Central venous catheter tip seen over the SVC. Fluid is present over the distal esophagus which may be due to reflux. Hepatobiliary: Previous cholecystectomy. Pigtail drainage catheter tip in the region of the gallbladder fossa unchanged. Liver is otherwise unremarkable. Common bile duct stent unchanged. Pancreas: Stable moderate peripancreatic fluid extending into the gastrohepatic ligament unchanged. This fluid is fairly well-defined and could represent involving suggest cysts and less likely abscess. Spleen: Normal. Adrenals/Urinary Tract: Adrenal glands are normal. Kidneys are normal in size without hydronephrosis. Suggestion of minimal bilateral nephrolithiasis. Ureters and bladder are normal. Stomach/Bowel: Somewhat fluid distended stomach. Small bowel is unremarkable. Appendix is within normal. Mild diverticulosis of the colon. Vascular/Lymphatic: Minimal calcified plaque over the abdominal aorta. Aorta is normal in caliber. No adenopathy. Reproductive: Normal. Other: Moderate ascites unchanged. Mild diffuse subcutaneous edema unchanged. Musculoskeletal: No change. IMPRESSION: 1. Stable moderate peripancreatic fluid extending into the gastrohepatic ligament unchanged. This fluid is fairly organized and could represent involving pseudocysts and less likely abscess. Pigtail drainage catheter tip in the region of the gallbladder fossa unchanged. Common bile duct stent unchanged. 2. Persistent bibasilar opacification with slight interval improvement which may be due to improving atelectasis or infection. Stable small to moderate size left pleural effusion. 3. Moderate ascites unchanged. 4. Mild colonic diverticulosis. 5. Aortic atherosclerosis. Minimal bilateral nephrolithiasis. Aortic  Atherosclerosis (ICD10-I70.0). Electronically Signed   By: Marin Olp M.D.   On: 05/26/2020 13:31   DG CHEST PORT 1 VIEW  Result Date: 05/27/2020 CLINICAL DATA:  Abdominal pain EXAM: PORTABLE CHEST 1 VIEW COMPARISON:  May 26, 2020, May 24, 2020 FINDINGS: The cardiomediastinal silhouette is unchanged in contour.RIGHT upper extremity PICC tip terminates over the superior cavoatrial junction. Percutaneous cholecystostomy tube. Biliary drain. Small LEFT pleural effusion. No pneumothorax. Persistent LEFT greater than RIGHT basilar heterogeneous opacities. Gaseous dilation of the stomach. No acute osseous abnormality. IMPRESSION: 1. Small LEFT pleural effusion. 2. Persistent LEFT greater than RIGHT basilar heterogeneous opacities. Differential considerations include atelectasis versus infection. Electronically Signed   By: Valentino Saxon MD   On: 05/27/2020 09:24      Scheduled Meds: . acetaminophen  650 mg Oral Q6H  . acidophilus  2 capsule Oral Daily  . Chlorhexidine Gluconate Cloth  6 each Topical Daily  . enoxaparin (LOVENOX) injection  40 mg Subcutaneous Q24H  . furosemide  40 mg Intravenous BID  . influenza vac split quadrivalent PF  0.5 mL Intramuscular Tomorrow-1000  . insulin aspart  0-15 Units Subcutaneous 4 times per day  . mouth rinse  15 mL Mouth Rinse BID  . oxyCODONE  20 mg Oral  Q12H  . pantoprazole  40 mg Oral Daily  . polyethylene glycol  17 g Oral Daily  . pravastatin  20 mg Oral q1800  . sertraline  100 mg Oral Daily  . sodium chloride flush  10-40 mL Intracatheter Q12H  . traZODone  50 mg Oral QHS   Continuous Infusions: . sodium chloride Stopped (05/07/20 0923)  . TPN CYCLIC-ADULT (ION)       LOS: 30 days    Time spent: 42 minutes spent on chart review, discussion with nursing staff, consultants, updating family and interview/physical exam; more than 50% of that time was spent in counseling and/or coordination of care.    Emilian Stawicki J [place]  DO [facility] Available via Epic secure chat 7am-7pm After these hours, please refer to coverage provider listed on [email or web address] 05/27/2020, 12:25 PM

## 2020-05-27 NOTE — Progress Notes (Signed)
0820 Pt called and c/o stabbing chest pain, not radiating to arms, pain rate 8/10. Dr [place] notified, placed orders. EKG and CXR done. Pain meds given. Pt feeling better after few min.

## 2020-05-27 NOTE — Progress Notes (Signed)
PHARMACY - TOTAL PARENTERAL NUTRITION CONSULT NOTE   Indication: severe pancreatitis  Patient Measurements: Height: 5' 9"  (175.3 cm) Weight: 84.6 kg (186 lb 8.2 oz) IBW/kg (Calculated) : 70.7 TPN AdjBW (KG): 86.1 Body mass index is 27.54 kg/m.  Assessment: 52 yom admitted 04/26/20, post-ERCP 8/18 and biliary stent exchange for persistent postoperative bile leak related to recent cholecystectomy with abscess s/p percutaneous drainage. Patient developed acute pancreatitis, has had a prolonged hospitalization complicated by VDRF (extubated 8/24), AKI, encephalopathy, and afib. TPN initiated 9/11 for acute post-ERCP pancreatitis/ileus and persistent biliary leak. Significant gastric fluid retention from functional outlet obstruction. Pt still with very poor PO intake.  9/15 - Advanced to FLD 9/17 - Pt reports minimal PO intake exacerbates severe back pain, refusing PO supplements 9/18 - Large coffee-ground emesis overnight, given phenergan  Glucose / Insulin: A1C 5.6. Note - SSI adjusted per cyclic TPN protocol 9/89. CBGs remain uncontrolled in 300s on TPN. CBG 139 off TPN. 24 units mSSI utilized in last 24 hrs Electrolytes: corr Ca high-normal ~10.3; others WNL Renal: Scr 0.75 stable, BUN up to 37. Lasix 53m IV BID ordered 9/17 x 4 doses (last 9/20 AM) LFTs / TGs: LFTs/Tbili/TG WNL. Alk phos elevated but trend down Prealbumin / albumin: Prealbumin down to 7.2. Albumin 1.6 Intake / Output; MIVF: UOP 0.172mkg/hr per documentation. LBM 9/17 (significant stool output noted per GI 9/17 after no significant stool for 2 week). Drain output [id number]. I/O +0.8L this admit GI Imaging: 9/5: CT - severe pancreatitis 9/7: HIDA - persistent bile leak with accumulations/paracolic gutter 9/9: CT - pancreatitis (peripancreatic fat stranding and peripancreatic fluid, percutaneous drain at the gallbladder fossa, unchanged biliary stent with no intrahepatic biliary duct dilatation, bilateral pleural  effusions 9/15: KUB - diffuse colonic stool, no bowel obstruction/free air 9/17: X-ray lumbar spine - no acute abnormality 9/18: CT abd/pelvis - pending Surgeries / Procedures: 8/18: ERCP/biliary stent exchange 9/9: Unsuccessful ERCP 9/10: Paracentesis - 1.4L out  Central access: PICC placed 05/19/20 TPN start date: 05/19/20  Nutritional Goals (per RD recommendation on 05/18/20): kCal: 2300-2500, Protein: 130-145g, Fluid: >2.3L/day Goal TPN rate is 100 mL/24hr (provides 132 g of protein and 2342 kcals per day)  Current Nutrition: TPN 9/17 - Revert back to CLD, d/c PO supplemements  Plan:  Continue 14-21-JHclic TPN at [id number]. SSI was adjusted per cyclic TPN protocol 9/10/81CBGs remain elevated on TPN but were at goal off TPN yesterday. Will add insulin to TPN bag today. Discussed plan with Dr. [place]u CBG control <200 prior to further titration of cyclic TPN. May need to revert back to 16-hr or 18-hr cycle if patient does not tolerate.  Cycle 2404 mL over 14 hours - 92 mL/hr x 1 hr; then 185 mL/hr x 12 hrs; then 92 mL/hr x 1 hr  TPN will provide 132g AA, 67g ILE, and 337g CHO, for total 2347 kCal, meeting 100% of patient needs. Electrolytes in TPN: reduce Na to 100m56m; otherwise continue same today - K 35mE57m Mag 6mEq/76mPhos 4mmol/78mand Ca 0mEq/L;67m:Ac 1:1 Add standard MVI and trace elements to TPN Add 15 units regular insulin to TPN bag + continue moderate custom cyclic 4x daily SSI and adjust as needed Monitor further diuresis plans F/u TPN labs, GI plans, toleration of PO diet and ability to wean TPN Change IV meds to PO when tolerating diet reliably   Marthann Abshier voArturo Morton, BCPS Please check AMION for all [facility] numbers Clinical Pharmacist 05/27/2020 8:04 AM

## 2020-05-27 NOTE — Progress Notes (Signed)
Large coffee grnd emesis-

## 2020-05-28 DIAGNOSIS — D62 Acute posthemorrhagic anemia: Secondary | ICD-10-CM

## 2020-05-28 LAB — DIFFERENTIAL
Abs Immature Granulocytes: 0.83 10*3/uL — ABNORMAL HIGH (ref 0.00–0.07)
Basophils Absolute: 0.1 10*3/uL (ref 0.0–0.1)
Basophils Relative: 1 %
Eosinophils Absolute: 0.3 10*3/uL (ref 0.0–0.5)
Eosinophils Relative: 1 %
Immature Granulocytes: 4 %
Lymphocytes Relative: 6 %
Lymphs Abs: 1.5 10*3/uL (ref 0.7–4.0)
Monocytes Absolute: 1.6 10*3/uL — ABNORMAL HIGH (ref 0.1–1.0)
Monocytes Relative: 7 %
Neutro Abs: 19 10*3/uL — ABNORMAL HIGH (ref 1.7–7.7)
Neutrophils Relative %: 81 %

## 2020-05-28 LAB — COMPREHENSIVE METABOLIC PANEL
ALT: 29 U/L (ref 0–44)
AST: 28 U/L (ref 15–41)
Albumin: 1.4 g/dL — ABNORMAL LOW (ref 3.5–5.0)
Alkaline Phosphatase: 303 U/L — ABNORMAL HIGH (ref 38–126)
Anion gap: 11 (ref 5–15)
BUN: 41 mg/dL — ABNORMAL HIGH (ref 8–23)
CO2: 28 mmol/L (ref 22–32)
Calcium: 8.2 mg/dL — ABNORMAL LOW (ref 8.9–10.3)
Chloride: 103 mmol/L (ref 98–111)
Creatinine, Ser: 0.82 mg/dL (ref 0.61–1.24)
GFR calc Af Amer: >60 mL/min (ref >60)
GFR calc non Af Amer: >60 mL/min (ref >60)
Glucose, Bld: 297 mg/dL — ABNORMAL HIGH (ref 70–99)
Potassium: 3.8 mmol/L (ref 3.5–5.1)
Sodium: 142 mmol/L (ref 135–145)
Total Bilirubin: 0.9 mg/dL (ref 0.3–1.2)
Total Protein: 5.7 g/dL — ABNORMAL LOW (ref 6.5–8.1)

## 2020-05-28 LAB — CBC
HCT: 28.6 % — ABNORMAL LOW (ref 39.0–52.0)
Hemoglobin: 8.8 g/dL — ABNORMAL LOW (ref 13.0–17.0)
MCH: 27.8 pg (ref 26.0–34.0)
MCHC: 30.8 g/dL (ref 30.0–36.0)
MCV: 90.2 fL (ref 80.0–100.0)
Platelets: 321 10*3/uL (ref 150–400)
RBC: 3.17 MIL/uL — ABNORMAL LOW (ref 4.22–5.81)
RDW: 15.4 % (ref 11.5–15.5)
WBC: 23.3 10*3/uL — ABNORMAL HIGH (ref 4.0–10.5)
nRBC: 0.1 % (ref 0.0–0.2)

## 2020-05-28 LAB — TRIGLYCERIDES: Triglycerides: 162 mg/dL — ABNORMAL HIGH (ref <150)

## 2020-05-28 LAB — GLUCOSE, CAPILLARY
Glucose-Capillary: 267 mg/dL — ABNORMAL HIGH (ref 70–99)
Glucose-Capillary: 251 mg/dL — ABNORMAL HIGH (ref 70–99)
Glucose-Capillary: 244 mg/dL — ABNORMAL HIGH (ref 70–99)
Glucose-Capillary: 194 mg/dL — ABNORMAL HIGH (ref 70–99)
Glucose-Capillary: 153 mg/dL — ABNORMAL HIGH (ref 70–99)
Glucose-Capillary: 175 mg/dL — ABNORMAL HIGH (ref 70–99)

## 2020-05-28 LAB — PHOSPHORUS: Phosphorus: 3.2 mg/dL (ref 2.5–4.6)

## 2020-05-28 LAB — PREALBUMIN: Prealbumin: 8.2 mg/dL — ABNORMAL LOW (ref 18–38)

## 2020-05-28 LAB — MAGNESIUM: Magnesium: 2.1 mg/dL (ref 1.7–2.4)

## 2020-05-28 MED ORDER — ENOXAPARIN SODIUM 40 MG/0.4ML ~~LOC~~ SOLN
40.0000 mg | SUBCUTANEOUS | Status: DC
  Administered 2020-05-29 – 2020-06-14 (×16): 40 mg via SUBCUTANEOUS
  Filled 2020-05-28 (×22): qty 0.4

## 2020-05-28 MED ORDER — TRAVASOL 10 % IV SOLN
INTRAVENOUS | Status: AC
  Filled 2020-05-28: qty 1438.8

## 2020-05-28 MED ORDER — POTASSIUM CHLORIDE 10 MEQ/50ML IV SOLN
10.0000 meq | INTRAVENOUS | Status: AC
  Administered 2020-05-28 (×3): 10 meq via INTRAVENOUS
  Filled 2020-05-28 (×3): qty 50

## 2020-05-28 MED ORDER — INSULIN ASPART 100 UNIT/ML ~~LOC~~ SOLN
0.0000 [IU] | SUBCUTANEOUS | Status: DC
  Administered 2020-05-28 (×2): 3 [IU] via SUBCUTANEOUS
  Administered 2020-05-28: 2 [IU] via SUBCUTANEOUS
  Administered 2020-05-29: 8 [IU] via SUBCUTANEOUS
  Administered 2020-05-29: 3 [IU] via SUBCUTANEOUS
  Administered 2020-05-29 – 2020-05-30 (×2): 5 [IU] via SUBCUTANEOUS

## 2020-05-28 MED ORDER — PIPERACILLIN-TAZOBACTAM 3.375 G IVPB
3.3750 g | INTRAVENOUS | Status: AC
  Administered 2020-05-29: 3.375 g via INTRAVENOUS
  Filled 2020-05-28 (×2): qty 50

## 2020-05-28 NOTE — Progress Notes (Addendum)
° °       Daily Rounding Note ° °05/28/2020, 9:25 AM ° LOS: 31 days  ° °SUBJECTIVE:   °Chief complaint:  Persistent bile leak post op   °rough weekend w N/V, some CGE?.  Dr [place] ordered NGT placed. °1150 mL of output from biliary drain yesterday.   °900 mL of NGT drainage yesterday.   °TPN continues.    ° °OBJECTIVE:        ° Vital signs in last 24 hours:    °Temp:  [97.8 °F (36.6 °C)-99 °F (37.2 °C)] 99 °F (37.2 °C) (09/20 0855) °Pulse Rate:  [80-106] 80 (09/20 0855) °Resp:  [16-20] 20 (09/20 0855) °BP: (125-140)/(84-95) 125/84 (09/20 0855) °SpO2:  [92 %-99 %] 92 % (09/20 0855) °Weight:  [83.5 kg] 83.5 kg (09/20 0500) °Last BM Date: 05/25/20 °Filed Weights  ° 05/24/20 0500 05/26/20 0500 05/28/20 0500  °Weight: 86.3 kg 84.6 kg 83.5 kg  ° °General: tremulous, looks unwell and miserable.    °Heart: Reg, tachy °Chest: some dry crackles at base.  Some labored breathing °Abdomen: tense, NT.  Brown dark bilious output to per drain.  BS scant  °Extremities: bil pitting pedal edema.   °Neuro/Psych:  Oriented x 3.  Anxious, fretful.  Weak.   ° °Intake/Output from previous day: °09/19 0701 - 09/20 0700 °In: -  °Out: 1800 [Urine:600; Emesis/NG output:900] ° °Intake/Output this shift: °No intake/output data recorded. ° °Lab Results: °Recent Labs  °  05/26/20 °1630 05/27/20 °0355 05/28/20 °0350  °WBC 29.6* 25.5* 23.3*  °HGB 10.4* 9.6* 8.8*  °HCT 34.4* 31.1* 28.6*  °PLT 320 332 321  ° °BMET °Recent Labs  °  05/26/20 °0349 05/27/20 °0355 05/28/20 °0350  °NA 141 142 142  °K 4.1 4.2 3.8  °CL 104 102 103  °CO2 28 31 28  °GLUCOSE 320* 344* 297*  °BUN 28* 37* 41*  °CREATININE 0.61 0.75 0.82  °CALCIUM 8.3* 8.4* 8.2*  ° °LFT °Recent Labs  °  05/28/20 °0350  °PROT 5.7*  °ALBUMIN 1.4*  °AST 28  °ALT 29  °ALKPHOS 303*  °BILITOT 0.9  ° °PT/INR °No results for input(s): LABPROT, INR in the last 72 hours. °Hepatitis Panel °No results for input(s): HEPBSAG, HCVAB, HEPAIGM, HEPBIGM in the  last 72 hours. ° °Studies/Results: °CT ABDOMEN PELVIS W CONTRAST ° °Result Date: 05/26/2020 °FINDINGS: Lower chest: Persistent bibasilar opacification with slight interval improvement which may be due to improving atelectasis or infection. Small to moderate size left pleural effusion without significant change. Central venous catheter tip seen over the SVC. Fluid is present over the distal esophagus which may be due to reflux. Hepatobiliary: Previous cholecystectomy. Pigtail drainage catheter tip in the region of the gallbladder fossa unchanged. Liver is otherwise unremarkable. Common bile duct stent unchanged. Pancreas: Stable moderate peripancreatic fluid extending into the gastrohepatic ligament unchanged. This fluid is fairly well-defined and could represent involving suggest cysts and less likely abscess. Spleen: Normal. Adrenals/Urinary Tract: Adrenal glands are normal. Kidneys are normal in size without hydronephrosis. Suggestion of minimal bilateral nephrolithiasis. Ureters and bladder are normal. Stomach/Bowel: Somewhat fluid distended stomach. Small bowel is unremarkable. Appendix is within normal. Mild diverticulosis of the colon. Vascular/Lymphatic: Minimal calcified plaque over the abdominal aorta. Aorta is normal in caliber. No adenopathy. Reproductive: Normal. Other: Moderate ascites unchanged. Mild diffuse subcutaneous edema unchanged. Musculoskeletal: No change. IMPRESSION: 1. Stable moderate peripancreatic fluid extending into the gastrohepatic ligament unchanged. This fluid is fairly organized and could represent involving pseudocysts and less likely abscess. Pigtail   drainage catheter tip in the region of the gallbladder fossa unchanged. Common bile duct stent unchanged. 2. Persistent bibasilar opacification with slight interval improvement which may be due to improving atelectasis or infection. Stable small to moderate size left pleural effusion. 3. Moderate ascites unchanged. 4. Mild colonic  diverticulosis. 5. Aortic atherosclerosis. Minimal bilateral nephrolithiasis. Aortic Atherosclerosis (ICD10-I70.0). Electronically Signed   By: Daniel  Boyle M.D.   On: 05/26/2020 13:31  ° °DG CHEST PORT 1 VIEW ° °Result Date: 05/27/2020 °FINDINGS: The cardiomediastinal silhouette is unchanged in contour.RIGHT upper extremity PICC tip terminates over the superior cavoatrial junction. Percutaneous cholecystostomy tube. Biliary drain. Small LEFT pleural effusion. No pneumothorax. Persistent LEFT greater than RIGHT basilar heterogeneous opacities. Gaseous dilation of the stomach. No acute osseous abnormality. IMPRESSION: 1. Small LEFT pleural effusion. 2. Persistent LEFT greater than RIGHT basilar heterogeneous opacities. Differential considerations include atelectasis versus infection. Electronically Signed   By: Stephanie  Peacock MD   On: 05/27/2020 09:24  ° °DG Abd Portable 1V ° °Result Date: 05/27/2020 °CLINICAL DATA:  Nasogastric tube positioning. EXAM: PORTABLE ABDOMEN - 1 VIEW COMPARISON:  May 23, 2020 FINDINGS: Since the prior study there is been interval placement of a nasogastric tube, with its distal tip overlying the expected region of the body of the stomach. The bowel gas pattern is normal. Stable right-sided percutaneous pigtail catheter positioning is seen. A radiopaque stent is again seen overlying the right upper quadrant. No radio-opaque calculi or other significant radiographic abnormality are seen. IMPRESSION: Nasogastric tube positioning, as described above. Electronically Signed   By: Thaddeus  Houston M.D.   On: 05/27/2020 22:04  ° °Scheduled Meds: °• acetaminophen  650 mg Oral Q6H  °• acidophilus  2 capsule Oral Daily  °• Chlorhexidine Gluconate Cloth  6 each Topical Daily  °• enoxaparin (LOVENOX) injection  40 mg Subcutaneous Q24H  °• influenza vac split quadrivalent PF  0.5 mL Intramuscular Tomorrow-1000  °• insulin aspart  0-15 Units Subcutaneous 4 times per day  °• mouth rinse  15 mL  Mouth Rinse BID  °• oxyCODONE  20 mg Oral Q12H  °• pantoprazole (PROTONIX) IV  40 mg Intravenous Q12H  °• polyethylene glycol  17 g Oral Daily  °• pravastatin  20 mg Oral q1800  °• sertraline  100 mg Oral Daily  °• sodium chloride flush  10-40 mL Intracatheter Q12H  °• traZODone  50 mg Oral QHS  ° °Continuous Infusions: °• sodium chloride Stopped (05/07/20 0923)  ° °PRN Meds:.bisacodyl, HYDROmorphone (DILAUDID) injection, ipratropium-albuterol, lidocaine, LORazepam, metoprolol tartrate, oxyCODONE, polyvinyl alcohol, promethazine, sodium chloride flush, sodium chloride flush ° ° °ASSESMENT:  ° °*   Bile leak post cholecystectomy °Failed to resolve despite ERCP and stenting x 2, with upsized metal stent placed in 04/2020 °Large volume bile output persists vial perc drain ° °*   Post ERCP pancreatitis.  Organizing pseudocyst per latest CTAP ° °*   Normocytic anemia.  Hgb drop 10.4 >> 8.8 in last 3 d.   ° °*   Azotemia.  BUN rise >> creat rise ° °*    GOO.   ° °*   Bil pleural effusion.   ° °*   IDDM.   ° °*   Chronic pain syndrome.  On narcotics at home.  Now on oxycontin, lidocaine patch, scheduled tylenol, prn oxy ir, prn ° °*   Depression, anxiety.   ° °PLAN  ° °*    Dr Shelby Allen, surgeon, will see pt today.  Feels pt needs PTC and will   reach out to IR. ° ° ° °Hadlie Gipson  05/28/2020, 9:25 AM °Phone [phone] °

## 2020-05-28 NOTE — Progress Notes (Signed)
Physical Therapy Treatment Patient Details Name: Adam Fowler MRN: [id number] DOB: [birth year] Today's Date: 05/28/2020    History of Present Illness Pt is a 64 yo male who presents to the ED with severe abdominal pain, nausea and vomiting. Found to have post ERCP acute pancreatits. Pt developed Hypoxic resp failure and was Intubated 8/22  and extubated 8/24. Pt also with new onset a fib and acute ileus. PMH includes HTN and ERCP.     PT Comments    Pt seen for mobility today with no issues reported.    Follow Up Recommendations  Home health PT;Supervision for mobility/OOB     Equipment Recommendations  None recommended by PT    Precautions / Restrictions Precautions Precautions: Fall Precaution Comments: abdominal drain,  NG tube, h/o desating with mobility    Mobility  Bed Mobility               General bed mobility comments: Seated OOB/EOB with session.  Transfers Overall transfer level: Needs assistance Equipment used: None;1 person hand held assist Transfers: Sit to/from [facility] Sit to Stand: Supervision Stand pivot transfers: Supervision       General transfer comment: pt transfered from [facility] to edge of bed so recliner could be set up. pt then transfered from edge of bed to recliner. NT in room during session to let pt know the RN said not able to shower at this time. Pt then wanting to brush his teeth. Pt set up with needed items to brush teeth.      Cognition Arousal/Alertness: Awake/alert Behavior During Therapy: WFL for tasks assessed/performed;Anxious Overall Cognitive Status: Impaired/Different from baseline Area of Impairment: Problem solving;Safety/judgement;Memory                   Current Attention Level: Sustained;Selective Memory: Decreased short-term memory Following Commands: Follows one step commands consistently Safety/Judgement: Decreased awareness of safety;Decreased awareness of deficits Awareness:  Emergent Problem Solving: Difficulty sequencing;Slow processing;Requires verbal cues;Requires tactile cues General Comments: pt seated on BSC on arrival stating he was trying to get to bathroom to shave adn shower. assisted pt to edge of bed and reoriented to the tubes/lines attached to him at this time.       Pertinent Vitals/Pain Pain Assessment: No/denies pain     PT Goals (current goals can now be found in the care plan section) Acute Rehab PT Goals Patient Stated Goal: go home PT Goal Formulation: With patient Time For Goal Achievement: 05/30/20 Potential to Achieve Goals: Good Progress towards PT goals: Progressing toward goals    Frequency    Min 3X/week      PT Plan Current plan remains appropriate    AM-PAC PT "6 Clicks" Mobility   Outcome Measure  Help needed turning from your back to your side while in a flat bed without using bedrails?: None Help needed moving from lying on your back to sitting on the side of a flat bed without using bedrails?: None Help needed moving to and from a bed to a chair (including a wheelchair)?: None Help needed standing up from a chair using your arms (e.g., wheelchair or bedside chair)?: None Help needed to walk in hospital room?: None Help needed climbing 3-5 steps with a railing? : None 6 Click Score: 24    End of Session Equipment Utilized During Treatment: Oxygen Activity Tolerance: Patient tolerated treatment well;No increased pain Patient left: in chair;with call bell/phone within reach;with chair alarm set Nurse Communication: Mobility status PT Visit Diagnosis: Unsteadiness on  feet (R26.81);Muscle weakness (generalized) (M62.81);Difficulty in walking, not elsewhere classified (R26.2)     Time: 1518-3437 PT Time Calculation (min) (ACUTE ONLY): 16 min  Charges:  $Therapeutic Activity: 8-22 mins                    Willow Ora, PTA, [facility] Acute [employer] [phone] 05/28/20, 11:35 AM  Willow Ora 05/28/2020, 11:34 AM

## 2020-05-28 NOTE — Progress Notes (Signed)
NGT- 35fr- placed to RT nare-unucessful attempt to left- immediate return of coffee brwn liquid- verified placement on x-ray- placed to LIS

## 2020-05-28 NOTE — Consult Note (Signed)
Chief Complaint: Patient was seen in consultation today for percutaneous transhepatic cholangiogram with Biliary drain placement and possible paracentesis Chief Complaint  Patient presents with  . Abdominal Pain   at the request of Dr Ayesha Rumpf   Supervising Physician: Aletta Edouard  Patient Status: [facility] - In-pt  History of Present Illness: Adam Fowler is a 64 y.o. 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 He currently has a metal stent in place in the common bile duct, however bilious output from perc drain in gallbladder fossa is 1L per day, indicating the stent is not functioning. I reviewed his recent CT scans and tube cholangiogram - cholangiogram shows filling of the gallbladder remnant and cystic duct. Patient needs biliary decompression to restore enterohepatic circulation and allow biliary fistula to close. Recent attempt at ERCP was not successful due to severe duodenitis/pancreatitis. If ERCP is not feasible, the patient needs a PTC by IR. Would also recommend placement of a nasojejunal feeding tube to allow enteral feeds and also to refeed bilious output from percutaneous drain.  She dicussed with Dr Kathlene Cote Decision made for PTC/Biliary drain and possible paracentesis  Scheduled now for same in IR tomorrow   Past Medical History:  Diagnosis Date  . History of kidney stones    passed  . Hypertension   . Insomnia     Past Surgical History:  Procedure Laterality Date  . ARTHROSCOPIC REPAIR ACL Bilateral    left, 2012; right 2004  . BILIARY STENT PLACEMENT  01/23/2020   Procedure: BILIARY STENT PLACEMENT;  Surgeon: Rush Landmark Telford Nab., MD;  Location: [place];  Service: Gastroenterology;;  . BILIARY STENT PLACEMENT  03/21/2020   Procedure: BILIARY STENT PLACEMENT;  Surgeon: Irving Copas., MD;  Location: [place];  Service: Gastroenterology;;  . BILIARY STENT PLACEMENT  04/25/2020   Procedure: BILIARY STENT PLACEMENT;  Surgeon: Irving Copas., MD;  Location: [place];  Service: Gastroenterology;;  . CHOLECYSTECTOMY N/A 01/24/2020   Procedure: LAPAROSCOPIC CHOLECYSTECTOMY;  Surgeon: Jesusita Oka, MD;  Location: [place];  Service: General;  Laterality: N/A;  . COLONOSCOPY    . ENDOSCOPIC RETROGRADE CHOLANGIOPANCREATOGRAPHY (ERCP) WITH PROPOFOL N/A 03/21/2020   Procedure: ENDOSCOPIC RETROGRADE CHOLANGIOPANCREATOGRAPHY (ERCP) WITH PROPOFOL;  Surgeon: Rush Landmark Telford Nab., MD;  Location: [place];  Service: Gastroenterology;  Laterality: N/A;  . ENDOSCOPIC RETROGRADE CHOLANGIOPANCREATOGRAPHY (ERCP) WITH PROPOFOL N/A 04/25/2020   Procedure: ENDOSCOPIC RETROGRADE CHOLANGIOPANCREATOGRAPHY (ERCP) WITH PROPOFOL;  Surgeon: Rush Landmark Telford Nab., MD;  Location: [place];  Service: Gastroenterology;  Laterality: N/A;  . ERCP N/A 01/21/2020   Procedure: ENDOSCOPIC RETROGRADE CHOLANGIOPANCREATOGRAPHY (ERCP);  Surgeon: Ladene Artist, MD;  Location: [facility] ENDOSCOPY;  Service: Endoscopy;  Laterality: N/A;  . ERCP N/A 01/23/2020   Procedure: ENDOSCOPIC RETROGRADE CHOLANGIOPANCREATOGRAPHY (ERCP);  Surgeon: Irving Copas., MD;  Location: [place];  Service: Gastroenterology;  Laterality: N/A;  . ESOPHAGOGASTRODUODENOSCOPY (EGD) WITH PROPOFOL N/A 05/17/2020   Procedure: ESOPHAGOGASTRODUODENOSCOPY (EGD) WITH PROPOFOL;  Surgeon: Rush Landmark Telford Nab., MD;  Location: [place];  Service: Gastroenterology;  Laterality: N/A;  . IR CHOLANGIOGRAM EXISTING TUBE  05/18/2020  . IR PARACENTESIS  05/18/2020  . IR RADIOLOGIST EVAL & MGMT  03/01/2020  . IR RADIOLOGIST EVAL & MGMT  04/10/2020  . PANCREATIC STENT PLACEMENT  01/21/2020   Procedure: PANCREATIC STENT PLACEMENT;  Surgeon: Ladene Artist, MD;  Location: [facility] ENDOSCOPY;  Service: Endoscopy;;  .  PANCREATIC  STENT PLACEMENT  01/23/2020   Procedure: PANCREATIC STENT PLACEMENT;  Surgeon: Irving Copas., MD;  Location: [place];  Service: Gastroenterology;;  . [name]  01/23/2020   Procedure: [name];  Surgeon: Rush Landmark Telford Nab., MD;  Location: [place];  Service: Gastroenterology;;  . Lavell Islam REMOVAL  03/21/2020   Procedure: STENT REMOVAL;  Surgeon: Irving Copas., MD;  Location: [place];  Service: Gastroenterology;;  . Lavell Islam REMOVAL  04/25/2020   Procedure: STENT REMOVAL;  Surgeon: Irving Copas., MD;  Location: [place];  Service: Gastroenterology;;    Allergies: Patient has no known allergies.  Medications: Prior to Admission medications   Medication Sig Start Date End Date Taking? Authorizing Provider  Calcium Carb-Cholecalciferol (CALCIUM+D3 PO) Take 1 tablet by mouth daily.   Yes [provider]  Coenzyme Q10 (COQ10) 400 MG CAPS Take 800 mg by mouth in the morning.    Yes [provider]  Cyanocobalamin (VITAMIN B-12) 5000 MCG TBDP Take 5,000 mcg by mouth daily.   Yes [provider]  GLUCOSAMINE-CHONDROITIN PO Take 1 tablet by mouth in the morning and at bedtime.    Yes [provider]  Javier Docker Oil 1000 MG CAPS Take 1,000 mg by mouth in the morning and at bedtime.    Yes [provider]  lisinopril-hydrochlorothiazide (ZESTORETIC) 10-12.5 MG tablet Take 1 tablet by mouth daily. 03/04/20  Yes [provider]  Moringa Oleifera (MORINGA PO) Take 1,000 mg by mouth 2 (two) times daily.   Yes [provider]  Multiple Vitamin (MULTIVITAMIN WITH MINERALS) TABS tablet Take 1 tablet by mouth daily.   Yes [provider]  ondansetron (ZOFRAN) 4 MG tablet Take 1 tablet (4 mg total) by mouth every 8 (eight) hours as needed for nausea or vomiting. 04/25/20  Yes Mansouraty, Telford Nab., MD  OVER THE COUNTER MEDICATION Take 1 tablet by mouth See admin instructions. [facility] otc  tablet- Take 1 tablet by mouth once a day   Yes [provider]  oxyCODONE (OXY IR/ROXICODONE) 5 MG immediate release tablet Take 5 mg by mouth 2 (two) times daily as needed (for pain).  04/25/20  Yes [provider]  pravastatin (PRAVACHOL) 80 MG tablet Take 40 mg by mouth in the morning and at bedtime.   Yes [provider]  Red Yeast Rice 600 MG CAPS Take 1,200 mg by mouth at bedtime.    Yes [provider]  sertraline (ZOLOFT) 100 MG tablet Take 100 mg by mouth daily. 11/08/19  Yes [provider]  TURMERIC PO Take 1,500 mg by mouth 2 (two) times daily.   Yes [provider]  zolpidem (AMBIEN) 5 MG tablet Take 5 mg by mouth at bedtime. 12/29/19  Yes [provider]  ibuprofen (ADVIL) 200 MG tablet Take 400-600 mg by mouth every 6 (six) hours as needed for headache or moderate pain.    [provider]     Family History  Problem Relation Age of Onset  . Colon cancer Neg Hx   . Stomach cancer Neg Hx     Social History   Socioeconomic History  . Marital status: Married    Spouse name: Not on file  . Number of children: Not on file  . Years of education: Not on file  . Highest education level: Not on file  Occupational History  . Not on file  Tobacco Use  . Smoking status: Never Smoker  . Smokeless tobacco: Never Used  Vaping Use  .  Vaping Use: Never used  Substance and Sexual Activity  . Alcohol use: Not Currently    Comment: occasional  . Drug use: No  . Sexual activity: Not Currently    Partners: Female  Other Topics Concern  . Not on file  Social History Narrative  . Not on file   Social Determinants of Health   Financial Resource Strain:   . Difficulty of Paying Living Expenses: Not on file  Food Insecurity:   . Worried About Charity fundraiser in the Last Year: Not on file  . Ran Out of Food in the Last Year: Not on file  Transportation Needs:   . Lack of Transportation (Medical): Not on file    . Lack of Transportation (Non-Medical): Not on file  Physical Activity:   . Days of Exercise per Week: Not on file  . Minutes of Exercise per Session: Not on file  Stress:   . Feeling of Stress : Not on file  Social Connections:   . Frequency of Communication with Friends and Family: Not on file  . Frequency of Social Gatherings with Friends and Family: Not on file  . Attends Religious Services: Not on file  . Active Member of Clubs or Organizations: Not on file  . Attends Archivist Meetings: Not on file  . Marital Status: Not on file    Review of Systems: A 12 point ROS discussed and pertinent positives are indicated in the HPI above.  All other systems are negative.  Review of Systems  Constitutional: Positive for activity change, appetite change and fatigue. Negative for fever.  Respiratory: Positive for shortness of breath. Negative for cough.   Cardiovascular: Positive for leg swelling.  Gastrointestinal: Positive for abdominal distention, abdominal pain and nausea.  Neurological: Positive for weakness.  Psychiatric/Behavioral: Negative for behavioral problems and confusion.    Vital Signs: BP 125/84 (BP Location: Right Arm)   Pulse 80   Temp 99 F (37.2 C) (Oral)   Resp 20   Ht 5' 9"  (1.753 m)   Wt 184 lb 1.4 oz (83.5 kg)   SpO2 92%   BMI 27.18 kg/m   Physical Exam Vitals reviewed.  Cardiovascular:     Rate and Rhythm: Normal rate and regular rhythm.  Pulmonary:     Effort: Pulmonary effort is normal.     Breath sounds: Wheezing present.  Abdominal:     General: Bowel sounds are decreased. There is distension.     Tenderness: There is abdominal tenderness.  Skin:    General: Skin is warm.     Coloration: Skin is jaundiced.  Neurological:     Mental Status: He is alert and oriented to person, place, and time.  Psychiatric:        Behavior: Behavior normal.     Imaging: DG Chest 1 View  Result Date: 05/21/2020 CLINICAL DATA:  Dyspnea.  EXAM: CHEST  1 VIEW COMPARISON:  CT abdomen pelvis dated May 17, 2020. Chest x-ray dated May 03, 2020. FINDINGS: Enteric tube in the stomach. Right-sided PICC line with tip at the cavoatrial junction. The heart size and mediastinal contours are within normal limits. Left greater than right bibasilar opacities persist. Unchanged small left pleural effusion. No pneumothorax. Unchanged cholecystostomy tube in the right upper quadrant. No acute osseous abnormality. IMPRESSION: 1. Unchanged bibasilar pneumonia and small left pleural effusion. Electronically Signed   By: Titus Dubin M.D.   On: 05/21/2020 14:04   DG Lumbar Spine 2-3 Views  Result Date:  05/25/2020 CLINICAL DATA:  Ongoing back pain. EXAM: LUMBAR SPINE - 2-3 VIEW COMPARISON:  05/02/2020 FINDINGS: Normal alignment. Negative for fracture or mass. No bone lesion identified. Mild disc degeneration with mild anterior spurring L2-3 and L3-4. Biliary stent noted.  Pigtail catheter right upper quadrant. IMPRESSION: Mild lumbar degenerative change.  No acute abnormality. Electronically Signed   By: Franchot Gallo M.D.   On: 05/25/2020 09:34   DG Lumbar Spine 2-3 Views  Result Date: 05/02/2020 CLINICAL DATA:  Back pain EXAM: LUMBAR SPINE - 2-3 VIEW COMPARISON:  CT abdomen/pelvis dated 04/28/2020 FINDINGS: Five lumbar-type vertebral bodies. No evidence of fracture or dislocation. Vertebral body heights and intervertebral disc spaces are maintained. Mild degenerative changes at L3-4. Visualized bony pelvis appears intact. IMPRESSION: Negative. Electronically Signed   By: Julian Hy M.D.   On: 05/02/2020 09:48   DG Abd 1 View  Result Date: 05/23/2020 CLINICAL DATA:  Abdominal pain EXAM: ABDOMEN - 1 VIEW COMPARISON:  May 16, 2020; CT abdomen and pelvis May 17, 2020 FINDINGS: Pigtail catheter remains on the right laterally. Biliary stent in place. There is moderate air in the stomach without gross gastric distention. There is diffuse  stool in the colon. There is no small dilatation. No air-fluid level. No free air evident on supine examination. IMPRESSION: Pigtail catheter on the right laterally. Biliary stent in place. Diffuse stool in colon. No bowel obstruction or free air. Electronically Signed   By: Lowella Grip III M.D.   On: 05/23/2020 10:18   DG Abd 1 View  Result Date: 05/16/2020 CLINICAL DATA:  Abdominal distension with nausea and vomiting EXAM: ABDOMEN - 1 VIEW COMPARISON:  May 11, 2020 abdominal radiograph and CT abdomen and pelvis May 13, 2020. FINDINGS: Biliary stent appears unchanged in position. Pigtail catheter in periphery of right upper abdomen. There is moderate stool in the colon. There is no bowel dilatation or air-fluid level to suggest bowel obstruction. No free air. Feeding tube no longer present. IMPRESSION: Pigtail catheter right upper abdomen peripherally. Stable appearance of biliary stent on supine image. Moderate stool in colon. No bowel obstruction or free air evident. Electronically Signed   By: Lowella Grip III M.D.   On: 05/16/2020 13:58   DG Abd 1 View  Result Date: 05/11/2020 CLINICAL DATA:  Displacement of biliary stent. EXAM: ABDOMEN - 1 VIEW COMPARISON:  05/10/2020 FINDINGS: Biliary stent in the region the common bile duct unchanged. Gastric tube in the gastric antrum. Pigtail catheter overlying the liver unchanged. Normal bowel gas pattern. IMPRESSION: Biliary stent unchanged position in the in the region of the common bile duct. Feeding tube in the gastric antrum. Electronically Signed   By: Franchot Gallo M.D.   On: 05/11/2020 13:23   DG Abd 1 View  Result Date: 05/10/2020 CLINICAL DATA:  Abdominal distention.  Biliary stent. EXAM: ABDOMEN - 1 VIEW COMPARISON:  05/04/2020. FINDINGS: Feeding tube tip now noted at the level of the distal stomach/duodenal bulb. Right upper quadrant drainage catheter and biliary stent stable position. No bowel distention. No acute bony  abnormality. IMPRESSION: Feeding tube tip now noted the level of the distal stomach/duodenal bulb. Right upper quadrant drainage catheter and biliary stent in stable position. No bowel distention. Electronically Signed   By: Marcello Moores  Register   On: 05/10/2020 06:34   DG Abd 1 View  Result Date: 05/04/2020 CLINICAL DATA:  Feeding tube placement by [place] team EXAM: ABDOMEN - 1 VIEW COMPARISON:  CT 05/03/2020 FINDINGS: Feeding tube tip is post pyloric  and likely at the junction of the second and third portions of the duodenum. Contrast is noted within the more distal duodenum. Partially imaged pigtail drainage catheter and common bile duct stent. IMPRESSION: Feeding tube tip is post pyloric and likely at the junction of the second and third portions of the duodenum. Electronically Signed   By: Margaretha Sheffield MD   On: 05/04/2020 15:11   DG Abd 1 View  Result Date: 05/02/2020 CLINICAL DATA:  Ileus, nausea/vomiting, pancreatitis, recent ERCP EXAM: ABDOMEN - 1 VIEW COMPARISON:  CT abdomen/pelvis dated 04/28/2020 FINDINGS: Nonobstructive bowel gas pattern. Metallic common duct stent.  Cholecystostomy. Visualized osseous structures are within normal limits. IMPRESSION: Nonobstructive bowel gas pattern. Cholecystostomy and common duct stent. Electronically Signed   By: Julian Hy M.D.   On: 05/02/2020 09:47   DG Abd 1 View  Result Date: 04/29/2020 CLINICAL DATA:  OG tube placement EXAM: ABDOMEN - 1 VIEW COMPARISON:  CT 04/28/2020 FINDINGS: Transesophageal tube tip terminates at the GE junction with the side port in the distal thoracic esophagus. A right upper stepped in a pigtail catheter is seen as well as a biliary stent in the right upper quadrant. Rounded centrally lucent radiodensity projects over the mid abdomen, nonspecific and likely external to the patient. Additional telemetry leads overlie the midline abdomen. Multiple air distended loops of bowel in the upper abdomen compatible with probable  ileus. Atelectatic changes and effusions present in the lung bases. Osseous structures are unremarkable. IMPRESSION: 1. Transesophageal tube tip terminates at the GE junction with the side port in the distal thoracic esophagus. Recommend advancing approximately 10-15 cm for optimal functioning. 2. Right upper quadrant biliary stent and surgical drain in similar position to comparison CT. 3. Multiple air distended loops of bowel in the upper abdomen compatible with probable ileus. 4. Ovoid, centrally lucent structure projecting over the mid abdomen, possibly external to the patient. Correlate with visual inspection. 5. Atelectatic changes and effusions in the lung bases. These results will be called to the ordering clinician or representative by the Radiologist Assistant, and communication documented in the PACS or [employer]. Electronically Signed   By: Lovena Le M.D.   On: 04/29/2020 04:01   CT ANGIO CHEST PE W OR WO CONTRAST  Result Date: 04/29/2020 CLINICAL DATA:  PE suspected, high prob EXAM: CT ANGIOGRAPHY CHEST WITH CONTRAST TECHNIQUE: Multidetector CT imaging of the chest was performed using the standard protocol during bolus administration of intravenous contrast. Multiplanar CT image reconstructions and MIPs were obtained to evaluate the vascular anatomy. CONTRAST:  131m OMNIPAQUE IOHEXOL 350 MG/ML SOLN COMPARISON:  None. FINDINGS: Cardiovascular: Contrast injection is sufficient to demonstrate satisfactory opacification of the pulmonary arteries to the segmental level. There is no pulmonary embolus or evidence of right heart strain. The size of the main pulmonary artery is normal. Heart size is normal, with no pericardial effusion. The course and caliber of the aorta are normal. There is no atherosclerotic calcification. Opacification decreased due to pulmonary arterial phase contrast bolus timing. Mediastinum/Nodes: No mediastinal, hilar or axillary lymphadenopathy. Normal visualized  thyroid. Thoracic esophageal course is normal. Lungs/Pleura: Intermediate sized pleural effusions. Bilateral upper lobe interstitial opacities and basilar atelectasis. Upper Abdomen: Contrast bolus timing is not optimized for evaluation of the abdominal organs. The visualized portions of the organs of the upper abdomen are normal. Musculoskeletal: No chest wall abnormality. No bony spinal canal stenosis. Review of the MIP images confirms the above findings. IMPRESSION: 1. No pulmonary embolus or acute aortic syndrome. 2.  Intermediate sized pleural effusions with bilateral upper lobe interstitial opacities and basilar atelectasis. This may indicate pulmonary edema. Electronically Signed   By: Ulyses Jarred M.D.   On: 04/29/2020 06:34   CT ABDOMEN PELVIS W CONTRAST  Result Date: 05/26/2020 CLINICAL DATA:  Low back pain.  Possible acute pancreatitis. EXAM: CT ABDOMEN AND PELVIS WITH CONTRAST TECHNIQUE: Multidetector CT imaging of the abdomen and pelvis was performed using the standard protocol following bolus administration of intravenous contrast. CONTRAST:  17m OMNIPAQUE IOHEXOL 300 MG/ML  SOLN COMPARISON:  05/17/2020 FINDINGS: Lower chest: Persistent bibasilar opacification with slight interval improvement which may be due to improving atelectasis or infection. Small to moderate size left pleural effusion without significant change. Central venous catheter tip seen over the SVC. Fluid is present over the distal esophagus which may be due to reflux. Hepatobiliary: Previous cholecystectomy. Pigtail drainage catheter tip in the region of the gallbladder fossa unchanged. Liver is otherwise unremarkable. Common bile duct stent unchanged. Pancreas: Stable moderate peripancreatic fluid extending into the gastrohepatic ligament unchanged. This fluid is fairly well-defined and could represent involving suggest cysts and less likely abscess. Spleen: Normal. Adrenals/Urinary Tract: Adrenal glands are normal. Kidneys are  normal in size without hydronephrosis. Suggestion of minimal bilateral nephrolithiasis. Ureters and bladder are normal. Stomach/Bowel: Somewhat fluid distended stomach. Small bowel is unremarkable. Appendix is within normal. Mild diverticulosis of the colon. Vascular/Lymphatic: Minimal calcified plaque over the abdominal aorta. Aorta is normal in caliber. No adenopathy. Reproductive: Normal. Other: Moderate ascites unchanged. Mild diffuse subcutaneous edema unchanged. Musculoskeletal: No change. IMPRESSION: 1. Stable moderate peripancreatic fluid extending into the gastrohepatic ligament unchanged. This fluid is fairly organized and could represent involving pseudocysts and less likely abscess. Pigtail drainage catheter tip in the region of the gallbladder fossa unchanged. Common bile duct stent unchanged. 2. Persistent bibasilar opacification with slight interval improvement which may be due to improving atelectasis or infection. Stable small to moderate size left pleural effusion. 3. Moderate ascites unchanged. 4. Mild colonic diverticulosis. 5. Aortic atherosclerosis. Minimal bilateral nephrolithiasis. Aortic Atherosclerosis (ICD10-I70.0). Electronically Signed   By: DMarin OlpM.D.   On: 05/26/2020 13:31   CT ABDOMEN PELVIS W CONTRAST  Result Date: 05/17/2020 CLINICAL DATA:  Follow-up pancreatitis. EXAM: CT ABDOMEN AND PELVIS WITH CONTRAST TECHNIQUE: Multidetector CT imaging of the abdomen and pelvis was performed using the standard protocol following bolus administration of intravenous contrast. CONTRAST:  1037mOMNIPAQUE IOHEXOL 300 MG/ML  SOLN COMPARISON:  CT 4 days ago 05/13/2020, additional priors reviewed. FINDINGS: Lower chest: Similar left pleural effusion from prior exam, compressive atelectasis. Previous right pleural effusion has improved. There are worsening patchy opacities within the lingula and both lower lobes, many of which are ground-glass. Hepatobiliary: Mild hepatic steatosis. Stable  caudate cyst. Percutaneous drainage catheter in the gallbladder fossa unchanged in position. Trace adjacent fluid. There is a biliary stent in place. Small amount of pneumobilia in the liver. No intrahepatic biliary ductal dilatation. Pancreas: Redemonstrated sequela of pancreatitis with extensive peripancreatic fat stranding and peripancreatic fluid. Pancreatic fluid appears organizing, with some areas of peripheral enhancement anteriorly. The degree of peripancreatic fluid similar to prior exam. There is no evidence of pancreatic air. Mild atrophy of the pancreatic head. No pancreatic ductal dilatation. Spleen: Prominent size spanning 13.9 cm cranial caudal. No focal abnormality. Adrenals/Urinary Tract: Normal adrenal glands. No hydronephrosis or perinephric edema. Homogeneous renal enhancement with symmetric excretion on delayed phase imaging. Nonobstructing stones in both kidneys. Minimal cortical scarring in the upper left kidney. Urinary bladder is  partially distended without wall thickening. Stomach/Bowel: Enteric tube within the stomach which is distended with fluid and air. There is mild gastric wall thickening which may be in part reactive. Normal positioning of the duodenum and ligament of Treitz. No small bowel obstruction or obvious inflammation, allowing for presence of intra-abdominal ascites. Normal contrast filled appendix. Contrast throughout the colon with multifocal colonic diverticulosis. No discrete diverticulitis. Improved transverse colonic wall thickening from prior. Vascular/Lymphatic: Peripancreatic fluid causes mass effect on the peripheral portal vein, portal splenic confluence and upper aspect of the superior mesenteric vein, however no evidence of thrombus or occlusion. Vessels are narrowed but patent. The intrahepatic portal veins are patent. Abdominal aorta is normal in caliber with mild atherosclerosis. Prominent upper abdominal lymph nodes are likely reactive and stable from prior  exam. No progressive adenopathy. Reproductive: Normal sized prostate gland spanning 3.9 cm transverse. Other: Moderate volume of abdominopelvic ascites, not significantly changed from prior exam. Peripancreatic fluid appears slightly organizing with some areas of peripheral enhancement. No internal air. Generalized edema of the intra-abdominal and omental fat. Mild generalized subcutaneous body wall edema. There is no free intra-abdominal air. Musculoskeletal: There are no acute or suspicious osseous abnormalities. Degenerative change in the spine. IMPRESSION: 1. Redemonstrated sequela of pancreatitis with extensive peripancreatic fat stranding and peripancreatic fluid. Pancreatic fluid appears slightly organizing with some areas of peripheral enhancement anteriorly since the prior exam. The degree of peripancreatic fluid is similar to prior. 2. Percutaneous drainage catheter in the gallbladder fossa unchanged with minimal adjacent fluid. Unchanged position of biliary stent. No intrahepatic biliary ductal dilatation. 3. Moderate volume of abdominopelvic ascites, not significantly changed from prior exam. 4. Improved transverse colonic wall thickening from prior exam. Colonic diverticulosis without diverticulitis. 5. Bilateral nonobstructing renal stones. 6. Improving right pleural effusion, stable left pleural effusion. There are increasing patchy and ground-glass opacities within the lung bases that is suspicious for infection, included COVID-19. Aortic Atherosclerosis (ICD10-I70.0). Electronically Signed   By: Keith Rake M.D.   On: 05/17/2020 22:30   CT ABDOMEN PELVIS W CONTRAST  Result Date: 05/13/2020 CLINICAL DATA:  History of cholecystectomy complicated by development of a postoperative bile leak from the cystic duct remnant, post image guided placement of a percutaneous drainage catheter into the gallbladder fossa fluid collection on 02/13/2020 (Dr. Annamaria Boots). Patient subsequently underwent placement of  an internal biliary stent at 38/06/1750 which was complicated by development of postprocedural pancreatitis. Evaluate for bile leak/biloma. EXAM: CT ABDOMEN AND PELVIS WITH CONTRAST TECHNIQUE: Multidetector CT imaging of the abdomen and pelvis was performed using the standard protocol following bolus administration of intravenous contrast. CONTRAST:  143m OMNIPAQUE IOHEXOL 300 MG/ML  SOLN COMPARISON:  CT abdomen pelvis-05/03/2020; 04/28/2020; 04/26/2020; 03/01/2020; 02/13/2020; image guided placement of gallbladder fossa drainage catheter-02/13/2020; drainage catheter injection-03/01/2020; ERCP-03/21/2020 FINDINGS: Lower chest: Limited visualization of lower thorax demonstrates small to moderate-sized left and trace right-sided pleural effusions with associated bibasilar consolidative opacities and associated air bronchograms and volume loss within the bilateral lower lobes, left greater than right, similar to the 8/26 examination. Normal heart size.  No pericardial effusion. Hepatobiliary: Normal hepatic contour. No discrete hepatic lesions. Stable positioning of percutaneous drainage catheter with end coiled and locked within the gallbladder fossa. Stable positioning of internal biliary stent without development of intrahepatic biliary ductal dilatation. Interval development of small amount of perihepatic ascites which extends to the level of the lower abdomen/pelvis. Pancreas: Redemonstrated sequela of pancreatitis with extensive inflammatory change about the pancreatic bed. There is persistent enhancement of the pancreatic  parenchyma without definitive evidence of pancreatic necrosis. No definitive pancreatic ductal dilatation on this non pancreatic protocol CT scan. Inflammatory change again extends along the anterior aspect of the left pararenal space without peripherally enhancing definable/drainable pancreatic pseudocyst. Spleen: Normal appearance of the spleen. Adrenals/Urinary Tract: There is symmetric  enhancement and excretion of the bilateral kidneys. Redemonstrated punctate bilateral nonobstructing renal stones with dominant stones within the superior poles of the bilateral kidneys measuring 0.4 cm in diameter (left-coronal image 86, series 6; right-coronal image 88, series 6). No evidence of urinary obstruction or perinephric stranding. Normal appearance the bilateral adrenal glands. Normal appearance of the urinary bladder given degree of distention. Stomach/Bowel: Moderate colonic stool burden without evidence of enteric obstruction. Scattered colonic diverticulosis without evidence of superimposed acute diverticulitis. Normal appearance of the terminal ileum and the retrocecal appendix. Mild bowel wall thickening involving the transverse colon is likely reactive secondary to adjacent inflammatory change within the pancreas. No pneumoperitoneum, pneumatosis or portal venous gas. Vascular/Lymphatic: Scattered atherosclerotic plaque. The major branch vessels of the abdominal aorta appear patent within normal caliber abdominal aorta on this non CTA examination. The peripheral aspect of the portal vein as well as the portal splenule confluence and superior aspect of the SMV and splenic vein are narrowed though remains patent. Scattered porta hepatis, gastrohepatic ligament and mesenteric lymph nodes are prominent though individually not enlarged by size criteria, presumably reactive in etiology. Reproductive: Normal appearance of the prostate gland. Small moderate amount of fluid within the lower pelvis. Other: Diffuse body wall anasarca, most conspicuous about the midline of the low back. Musculoskeletal: No acute or aggressive osseous abnormalities. Stigmata of dish within the thoracic spine. Mild-to-moderate degenerative change the bilateral hips with joint space loss, subchondral sclerosis and osteophytosis. IMPRESSION: 1. Sequela of severe pancreatitis with extensive fluid about the pancreatic bed but  without peripherally enhancing definable/drainable pancreatic pseudocyst and without definitive evidence of pancreatic necrosis. 2. Stable positioning of internal biliary stent with apparent preserved functionality as there is no intrahepatic biliary ductal dilatation. 3. Interval development of small to moderate volume intra-abdominal ascites, the etiology of which is not determined on this examination though given extensive pancreatitis and apparent functionality of the internal biliary stent, fluid may represent pancreatic enzymes. Fluid sampling with sending aspirated fluid for amylase, lipase and bilirubin levels could be performed as indicated. Alternatively, if there is concern for a bile leak, further evaluation with nuclear medicine HIDA scan could be performed as indicated. 4. Stable positioning of gallbladder fossa percutaneous drainage catheter. 5. Grossly unchanged small to moderate sized left and trace right-sided pleural effusions with associated bibasilar opacities/atelectasis. 6. Bilateral nonobstructing nephrolithiasis. Electronically Signed   By: Sandi Mariscal M.D.   On: 05/13/2020 15:41   CT ABDOMEN PELVIS W CONTRAST  Result Date: 05/03/2020 CLINICAL DATA:  Abdominal pain and pancreatitis. Bowel obstruction suspected. EXAM: CT ABDOMEN AND PELVIS WITH CONTRAST TECHNIQUE: Multidetector CT imaging of the abdomen and pelvis was performed using the standard protocol following bolus administration of intravenous contrast. CONTRAST:  14m OMNIPAQUE IOHEXOL 300 MG/ML  SOLN COMPARISON:  04/28/2020 FINDINGS: Lower chest: Bilateral pleural effusions are identified, left greater than right. Small bilateral pleural effusions with overlying compressive type atelectasis. Bilateral patchy areas of ground-glass attenuation and subsegmental atelectasis noted within the imaged portions of the lungs. Hepatobiliary: No focal liver abnormality identified. Status post cholecystectomy. Percutaneous pigtail drainage  catheter is identified within the gallbladder fossa. The no significant fluid collection remains within the gallbladder fossa. Common bile duct stent is  identified. No intrahepatic biliary ductal dilatation. Pancreas: Marked diffuse peripancreatic inflammation with fat stranding and free fluid. Areas of hypoenhancement within the neck and head of pancreas identified consistent with small areas of pancreatic necrosis. The appearance is similar to 04/28/20. No focal fluid collection identified to suggest drainable abscess or pseudocyst. Spleen: Unremarkable. No pancreatic ductal dilatation or surrounding inflammatory changes. Adrenals/Urinary Tract: Normal appearance of the adrenal glands. 2 stones identified within the upper and lower pole of right kidney measuring up to 3 mm. Several small stones are also noted within the left kidney which measure up to 4 mm, image 34/3. No mass or hydronephrosis identified bilaterally. The urinary bladder is unremarkable. Stomach/Bowel: Moderate distension of the stomach. There is no pathologic dilatation of the large or small bowel loops to suggest bowel obstruction. Normal appearance of the appendix. Vascular/Lymphatic: Aortic atherosclerosis. No aneurysm. SMV, portal venous confluence, splenic vein and portal vein remain patent. No abdominopelvic adenopathy. Reproductive: Prostate is unremarkable. Other: Small volume of ascites is noted within the abdomen and pelvis. This is similar to 04/28/2020. No pneumoperitoneum identified. Musculoskeletal: No acute or significant osseous findings. IMPRESSION: 1. Status post cholecystectomy with percutaneous pigtail drainage catheter within the gallbladder fossa. No significant fluid collection remains within the gallbladder fossa. 2. Marked diffuse peripancreatic inflammation with fat stranding and free fluid compatible with acute pancreatitis. Small areas of hypoenhancement within the neck and head of pancreas consistent with areas of  pancreatic necrosis. The appearance is similar to 04/28/2020. No focal fluid collection identified to suggest drainable abscess or pseudocyst. 3. Bilateral pleural effusions, left greater than right. 4. Small volume of ascites. 5. Bilateral nonobstructing renal calculi. 6. Aortic atherosclerosis. Aortic Atherosclerosis (ICD10-I70.0). Electronically Signed   By: Kerby Moors M.D.   On: 05/03/2020 06:51   DG CHEST PORT 1 VIEW  Result Date: 05/27/2020 CLINICAL DATA:  Abdominal pain EXAM: PORTABLE CHEST 1 VIEW COMPARISON:  May 26, 2020, May 24, 2020 FINDINGS: The cardiomediastinal silhouette is unchanged in contour.RIGHT upper extremity PICC tip terminates over the superior cavoatrial junction. Percutaneous cholecystostomy tube. Biliary drain. Small LEFT pleural effusion. No pneumothorax. Persistent LEFT greater than RIGHT basilar heterogeneous opacities. Gaseous dilation of the stomach. No acute osseous abnormality. IMPRESSION: 1. Small LEFT pleural effusion. 2. Persistent LEFT greater than RIGHT basilar heterogeneous opacities. Differential considerations include atelectasis versus infection. Electronically Signed   By: Valentino Saxon MD   On: 05/27/2020 09:24   DG CHEST PORT 1 VIEW  Result Date: 05/24/2020 CLINICAL DATA:  Shortness of breath EXAM: PORTABLE CHEST 1 VIEW COMPARISON:  05/21/2020 FINDINGS: Right PICC line remains in place, unchanged. Low lung volumes. Patchy bilateral airspace opacities, most pronounced in the left lower lobe, similar to prior study. Heart is normal size. Suspect small left effusion. No pneumothorax. IMPRESSION: Stable patchy bilateral airspace disease, left greater than right with low lung volumes. Suspect small left effusion. Electronically Signed   By: Rolm Baptise M.D.   On: 05/24/2020 09:46   DG CHEST PORT 1 VIEW  Result Date: 05/03/2020 CLINICAL DATA:  Shortness of breath. EXAM: PORTABLE CHEST 1 VIEW COMPARISON:  Chest x-ray 12/27/2019. FINDINGS:  Interim removal of endotracheal tube. Heart size stable. Low lung volumes with bibasilar atelectasis/infiltrates again noted. Similar findings on prior exam. No pleural effusion or pneumothorax. Drainage catheter again noted over the right upper quadrant. No acute bony abnormality identified. IMPRESSION: 1.  Interim removal endotracheal tube. 2. Low lung volumes with bibasilar atelectasis/infiltrates again noted. Similar findings noted on prior exam. Electronically Signed  By: [place]   On: 05/03/2020 06:46   DG CHEST PORT 1 VIEW  Result Date: 05/01/2020 CLINICAL DATA:  ARDS, intubation EXAM: PORTABLE CHEST 1 VIEW COMPARISON:  Portable exam 0800 hours compared to 04/29/2020 FINDINGS: Tip of endotracheal tube 9 mm above carina recommend withdrawal 1-2 cm. Normal heart size and mediastinal contours. BILATERAL pulmonary infiltrates slightly improved in perihilar regions since previous exam. No pleural effusion or pneumothorax. IMPRESSION: Recommend withdrawal of endotracheal tube 1-2 cm. Improved pulmonary infiltrates. Findings called to [facility] RN in [facility] ICU on 05/01/2020 at 0821 hours. Electronically Signed   By: Lavonia Dana M.D.   On: 05/01/2020 08:22   DG Chest Port 1 View  Result Date: 04/29/2020 CLINICAL DATA:  Endotracheal tube placement EXAM: PORTABLE CHEST 1 VIEW COMPARISON:  04/28/2020 FINDINGS: Endotracheal tube tip at the level of the clavicular heads. There is shallow lung inflation with greater than right airspace opacities. Small pleural effusions. IMPRESSION: 1. Endotracheal tube tip at the level of the clavicular heads. 2. Bilateral airspace opacities, which may indicate multifocal infection or pulmonary edema. Electronically Signed   By: Ulyses Jarred M.D.   On: 04/29/2020 00:42   DG CHEST PORT 1 VIEW  Result Date: 04/28/2020 CLINICAL DATA:  Shortness of breath EXAM: PORTABLE CHEST 1 VIEW COMPARISON:  04/27/2020 and prior radiographs FINDINGS: This is a low volume study.  Cardiomediastinal silhouette is unchanged. Bilateral pleural effusions and bibasilar atelectasis noted. Mild interstitial prominence is noted which may represent interstitial edema. No pneumothorax. IMPRESSION: Bilateral pleural effusions with bibasilar atelectasis and possible mild interstitial pulmonary edema. Electronically Signed   By: Margarette Canada M.D.   On: 04/28/2020 15:10   DG ERCP  Result Date: 05/17/2020 CLINICAL DATA:  ERCP EXAM: ERCP TECHNIQUE: Multiple spot images obtained with the fluoroscopic device and submitted for interpretation post-procedure. FLUOROSCOPY TIME:  Fluoroscopy Time:  2 minutes and 22 seconds Radiation Exposure Index (if provided by the fluoroscopic device): 45.15 mGy Number of Acquired Spot Images: 6 COMPARISON:  CT dated 05/13/2020 FINDINGS: Initial images demonstrate a biliary stent in place in addition to a drain within the right upper quadrant. No injection of contrast was visualized. The images provided do not appear to demonstrate cannulation of the common bile duct. IMPRESSION: ERCP. Please see separate operative report for complete intraoperative details. These images were submitted for radiologic interpretation only. Please see the procedural report for the amount of contrast and the fluoroscopy time utilized. Electronically Signed   By: Constance Holster M.D.   On: 05/17/2020 15:30   DG Abd Portable 1V  Result Date: 05/27/2020 CLINICAL DATA:  Nasogastric tube positioning. EXAM: PORTABLE ABDOMEN - 1 VIEW COMPARISON:  May 23, 2020 FINDINGS: Since the prior study there is been interval placement of a nasogastric tube, with its distal tip overlying the expected region of the body of the stomach. The bowel gas pattern is normal. Stable right-sided percutaneous pigtail catheter positioning is seen. A radiopaque stent is again seen overlying the right upper quadrant. No radio-opaque calculi or other significant radiographic abnormality are seen. IMPRESSION:  Nasogastric tube positioning, as described above. Electronically Signed   By: Virgina Norfolk M.D.   On: 05/27/2020 22:04   DG Abd Portable 1V  Result Date: 05/01/2020 CLINICAL DATA:  Ileus. EXAM: PORTABLE ABDOMEN - 1 VIEW COMPARISON:  April 29, 2020. FINDINGS: The bowel gas pattern is normal. Pigtail drainage catheter is noted in right upper quadrant. Biliary stent is noted. IMPRESSION: No evidence of bowel obstruction or ileus. Electronically  Signed   By: Marijo Conception M.D.   On: 05/01/2020 12:50   ECHOCARDIOGRAM COMPLETE  Result Date: 04/29/2020    ECHOCARDIOGRAM REPORT   Patient Name:   GARO HEIDELBERG Date of Exam: 04/29/2020 Medical Rec #:  [id number]     Height:       69.0 in Accession #:    [id number]    Weight:       195.1 lb Date of Birth:  Sep 18, 1955      BSA:          2.044 m Patient Age:    64 years      BP:           104/75 mmHg Patient Gender: M             HR:           93 bpm. Exam Location:  Inpatient Procedure: 2D Echo Indications:    Acute Respiratory Insufficiency 518.82 / R06.89  History:        Patient has no prior history of Echocardiogram examinations.                 Signs/Symptoms:Bacteremia; Risk Factors:Non-Smoker. Post-ERCP                 acute pancreatitis, Acute respiratory failure with hypoxia ,                 Severe sepsis.  Sonographer:    Leavy Cella Referring Phys: [id number] [place]  1. Left ventricular ejection fraction, by estimation, is 60 to 65%. The left ventricle has normal function. The left ventricle has no regional wall motion abnormalities. There is mild left ventricular hypertrophy. Left ventricular diastolic parameters are consistent with Grade I diastolic dysfunction (impaired relaxation).  2. Right ventricular systolic function is normal. The right ventricular size is normal. There is normal pulmonary artery systolic pressure.  3. The mitral valve is normal in structure. No evidence of mitral valve regurgitation.  4. The aortic valve  is normal in structure. Aortic valve regurgitation is not visualized.  5. The inferior vena cava is normal in size with greater than 50% respiratory variability, suggesting right atrial pressure of 3 mmHg.  6. Technically very limited study due to poor sound wave transmission. FINDINGS  Left Ventricle: Left ventricular ejection fraction, by estimation, is 60 to 65%. The left ventricle has normal function. The left ventricle has no regional wall motion abnormalities. The left ventricular internal cavity size was normal in size. There is  mild left ventricular hypertrophy. Left ventricular diastolic parameters are consistent with Grade I diastolic dysfunction (impaired relaxation). Right Ventricle: The right ventricular size is normal. No increase in right ventricular wall thickness. Right ventricular systolic function is normal. There is normal pulmonary artery systolic pressure. The tricuspid regurgitant velocity is 2.23 m/s, and  with an assumed right atrial pressure of 10 mmHg, the estimated right ventricular systolic pressure is 86.1 mmHg. Left Atrium: Left atrial size was normal in size. Right Atrium: Right atrial size was normal in size. Pericardium: There is no evidence of pericardial effusion. Mitral Valve: The mitral valve is normal in structure. No evidence of mitral valve regurgitation. Tricuspid Valve: The tricuspid valve is grossly normal. Tricuspid valve regurgitation is trivial. Aortic Valve: The aortic valve is normal in structure. Aortic valve regurgitation is not visualized. Pulmonic Valve: The pulmonic valve was not well visualized. Pulmonic valve regurgitation is not visualized. Aorta: The aortic root and ascending aorta are structurally  normal, with no evidence of dilitation. Venous: The inferior vena cava is normal in size with greater than 50% respiratory variability, suggesting right atrial pressure of 3 mmHg. IAS/Shunts: No atrial level shunt detected by color flow Doppler.  LEFT VENTRICLE  PLAX 2D LVIDd:         4.33 cm  Diastology LVIDs:         2.82 cm  LV e' lateral:   15.00 cm/s LV PW:         1.35 cm  LV E/e' lateral: 3.2 LV IVS:        1.08 cm  LV e' medial:    8.27 cm/s LVOT diam:     2.00 cm  LV E/e' medial:  5.8 LVOT Area:     3.14 cm  RIGHT VENTRICLE TAPSE (M-mode): 2.2 cm LEFT ATRIUM             Index       RIGHT ATRIUM          Index LA diam:        3.90 cm 1.91 cm/m  RA Area:     9.57 cm LA Vol (A2C):   53.3 ml 26.07 ml/m RA Volume:   16.80 ml 8.22 ml/m LA Vol (A4C):   29.0 ml 14.19 ml/m LA Biplane Vol: 43.2 ml 21.13 ml/m   AORTA Ao Root diam: 3.10 cm MITRAL VALVE               TRICUSPID VALVE MV Area (PHT): 3.21 cm    TR Peak grad:   19.9 mmHg MV Decel Time: 236 msec    TR Vmax:        223.00 cm/s MV E velocity: 47.60 cm/s MV A velocity: 45.40 cm/s  SHUNTS MV E/A ratio:  1.05        Systemic Diam: 2.00 cm Glori Bickers MD Electronically signed by Glori Bickers MD Signature Date/Time: 04/29/2020/1:17:36 PM    Final    IR CHOLANGIOGRAM EXISTING TUBE  Result Date: 05/19/2020 INDICATION: 64 year old male with a complex history of acute cholecystitis status post biliary leak following laparoscopic cholecystectomy status post placement of a biliary drainage catheter complicated by severe pancreatitis. Patient currently has a percutaneous drainage catheter in the gallbladder space and has also developed new mild ascites. His gallbladder fossa drain output had been fairly copious in there is prior imaging documentation of a fistulous communication with the cystic duct remanent. His drain output has suddenly slowed. He presents for drain injection with possible exchange/upsize if needed as well as ultrasound-guided paracentesis for diagnostic purposes. EXAM: 1. Drain injection 2. Ultrasound-guided paracentesis MEDICATIONS: The patient is currently admitted to the hospital and receiving intravenous antibiotics. The antibiotics were administered within an appropriate time frame  prior to the initiation of the procedure. ANESTHESIA/SEDATION: None. COMPLICATIONS: None immediate. PROCEDURE: Informed written consent was obtained from the patient after a thorough discussion of the procedural risks, benefits and alternatives. All questions were addressed. Maximal Sterile Barrier Technique was utilized including caps, mask, sterile gowns, sterile gloves, sterile drape, hand hygiene and skin antiseptic. A timeout was performed prior to the initiation of the procedure. Hand injection of contrast material was performed through the existing right upper quadrant drainage catheter. The drainage catheter is patent. The contrast material fills a collapsed space adjacent to the drainage catheter. There is partial filling of the cystic duct, but no contrast material enters the common bile duct. Attention was turned to the right lower quadrant. The abdomen was interrogated with  ultrasound. There is small volume ascites. A suitable skin entry site was selected and marked. Local anesthesia was attained by infiltration with 1% lidocaine. A small dermatotomy was made. A 6 French Safe-T-Centesis catheter was then advanced through the abdominal wall into the ascites. Aspiration was performed yielding 1.4 L of bloody ascites. IMPRESSION: 1. Well-positioned and normally functioning right upper quadrant drainage catheter. The fistulous communication between the drainage catheter and the cystic duct appears to be nearly resolved. This likely explains the decreased output. No exchange or upsize necessary. 2. Successful ultrasound-guided diagnostic paracentesis yielding 1.4 L of bloody ascitic fluid. Samples were sent as requested. Electronically Signed   By: Jacqulynn Cadet M.D.   On: 05/19/2020 12:41   [place] EKG SITE RITE  Result Date: 05/18/2020 If Site Rite image not attached, placement could not be confirmed due to current cardiac rhythm.  NM HEPATO BILIARY LEAK  Result Date: 05/15/2020 CLINICAL DATA:   Recurrent right upper quadrant abdominal pain. Status post cholecystectomy. Evaluate for bile leak. EXAM: NUCLEAR MEDICINE HEPATOBILIARY IMAGING TECHNIQUE: Sequential images of the abdomen were obtained out to 60 minutes following intravenous administration of radiopharmaceutical. RADIOPHARMACEUTICALS:  5.4 mCi Tc-54m Choletec IV COMPARISON:  Abdomen/pelvis CT 05/13/2020. Nuclear scintigraphy 02/03/2020. FINDINGS: Prompt uptake and biliary excretion of activity by the liver is seen. Blood pool activity clears rapidly. Biliary activity evident by 25 minutes. No definite common bile duct activity by 60 minutes. No gut activity on today's study. Radiotracer accumulation at the inferior tip of the liver against a 25 minutes and progresses through the 60 minutes of the exam. This accumulates along the lateral right abdominal wall, inferior to the liver, likely in the paracolic gutter. Patient's biliary drain was unclamped and anterior imaging was obtained 5 minutes later, demonstrating radiotracer within the tubing of the drain. IMPRESSION: Imaging features consistent with persistent bile leak accumulating in the lateral aspect of the right upper quadrant, likely in the paracolic gutter. After unclamping the biliary drain, radiotracer is visible within the drain tubing. Electronically Signed   By: EMisty StanleyM.D.   On: 05/15/2020 12:29   IR Paracentesis  Result Date: 05/19/2020 INDICATION: 64year old male with a complex history of acute cholecystitis status post biliary leak following laparoscopic cholecystectomy status post placement of a biliary drainage catheter complicated by severe pancreatitis. Patient currently has a percutaneous drainage catheter in the gallbladder space and has also developed new mild ascites. His gallbladder fossa drain output had been fairly copious in there is prior imaging documentation of a fistulous communication with the cystic duct remanent. His drain output has suddenly slowed.  He presents for drain injection with possible exchange/upsize if needed as well as ultrasound-guided paracentesis for diagnostic purposes. EXAM: 1. Drain injection 2. Ultrasound-guided paracentesis MEDICATIONS: The patient is currently admitted to the hospital and receiving intravenous antibiotics. The antibiotics were administered within an appropriate time frame prior to the initiation of the procedure. ANESTHESIA/SEDATION: None. COMPLICATIONS: None immediate. PROCEDURE: Informed written consent was obtained from the patient after a thorough discussion of the procedural risks, benefits and alternatives. All questions were addressed. Maximal Sterile Barrier Technique was utilized including caps, mask, sterile gowns, sterile gloves, sterile drape, hand hygiene and skin antiseptic. A timeout was performed prior to the initiation of the procedure. Hand injection of contrast material was performed through the existing right upper quadrant drainage catheter. The drainage catheter is patent. The contrast material fills a collapsed space adjacent to the drainage catheter. There is partial filling of the cystic duct,  but no contrast material enters the common bile duct. Attention was turned to the right lower quadrant. The abdomen was interrogated with ultrasound. There is small volume ascites. A suitable skin entry site was selected and marked. Local anesthesia was attained by infiltration with 1% lidocaine. A small dermatotomy was made. A 6 French Safe-T-Centesis catheter was then advanced through the abdominal wall into the ascites. Aspiration was performed yielding 1.4 L of bloody ascites. IMPRESSION: 1. Well-positioned and normally functioning right upper quadrant drainage catheter. The fistulous communication between the drainage catheter and the cystic duct appears to be nearly resolved. This likely explains the decreased output. No exchange or upsize necessary. 2. Successful ultrasound-guided diagnostic  paracentesis yielding 1.4 L of bloody ascitic fluid. Samples were sent as requested. Electronically Signed   By: Jacqulynn Cadet M.D.   On: 05/19/2020 12:41    Labs:  CBC: Recent Labs    05/25/20 0359 05/26/20 1630 05/27/20 0355 05/28/20 0350  WBC 23.6* 29.6* 25.5* 23.3*  HGB 10.3* 10.4* 9.6* 8.8*  HCT 33.1* 34.4* 31.1* 28.6*  PLT 263 320 332 321    COAGS: Recent Labs    02/02/20 1902 02/02/20 2115 05/16/20 1905  INR 1.2 1.1 1.3*    BMP: Recent Labs    05/25/20 0359 05/26/20 0349 05/27/20 0355 05/28/20 0350  NA 136 141 142 142  K 4.3 4.1 4.2 3.8  CL 103 104 102 103  CO2 24 28 31 28   GLUCOSE 186* 320* 344* 297*  BUN 20 28* 37* 41*  CALCIUM 8.6* 8.3* 8.4* 8.2*  CREATININE 0.58* 0.61 0.75 0.82  GFRNONAA >60 >60 >60 >60  GFRAA >60 >60 >60 >60    LIVER FUNCTION TESTS: Recent Labs    05/21/20 0418 05/22/20 0424 05/24/20 0405 05/28/20 0350  BILITOT 0.7 0.6 0.7 0.9  AST 17 20 20 28   ALT 18 16 17 29   ALKPHOS 300* 276* 220* 303*  PROT 5.2* 5.4* 5.7* 5.7*  ALBUMIN 1.6* 1.6* 1.6* 1.4*    TUMOR MARKERS: No results for input(s): AFPTM, CEA, CA199, CHROMGRNA in the last 8760 hours.  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  This interventional procedure involves the use of X-rays and because of the nature of the planned procedure, it is possible that we will have prolonged use of X-ray fluoroscopy.  Potential radiation risks to you include  (but are not limited to) the following: - A slightly elevated risk for cancer  several years later in life. This risk is typically less than 0.5% percent. This risk is low in comparison to the normal incidence of human cancer, which is 33% for women and 50% for men according to the [employer]. - Radiation induced injury can include skin redness, resembling a rash, tissue breakdown / ulcers and hair loss (which can be temporary or permanent).   The likelihood of either of these occurring depends on the difficulty of the procedure and whether you are sensitive to radiation due to previous procedures, disease, or genetic conditions.   IF your procedure requires a prolonged use of radiation,  you will be notified and given written instructions for further action.  It is your responsibility to monitor the irradiated area for the 2 weeks following the procedure and to notify your physician if you are concerned that you have suffered a radiation induced injury.    All of the patient's questions were answered, patient is agreeable to proceed.  Consent signed and in chart  Thank you for this interesting consult.  I greatly enjoyed meeting Adam Fowler and look forward to participating in their care.  A copy of this report was sent to the requesting provider on this date.  Electronically Signed: Lavonia Drafts, PA-C 05/28/2020, 1:33 PM   I spent a total of 40 Minutes    in face to face in clinical consultation, greater than 50% of which was counseling/coordinating care for PTC/Biliary drain and paracentesis

## 2020-05-28 NOTE — Progress Notes (Signed)
[facility] Progress Note  11 Days Post-Op  Subjective: Patient has a lot of complaints about nursing overnight and placement of NGT overnight. No BM in the last few days. Understandably frustrated that he still is not doing better.    Objective: Vital signs in last 24 hours: Temp:  [97.8 F (36.6 C)-99 F (37.2 C)] 99 F (37.2 C) (09/20 0855) Pulse Rate:  [80-106] 80 (09/20 0855) Resp:  [16-20] 20 (09/20 0855) BP: (125-140)/(84-95) 125/84 (09/20 0855) SpO2:  [92 %-99 %] 92 % (09/20 0855) Weight:  [83.5 kg] 83.5 kg (09/20 0500) Last BM Date: 05/25/20  Intake/Output from previous day: 09/19 0701 - 09/20 0700 In: -  Out: 1800 [Urine:600; Emesis/NG output:900] Intake/Output this shift: No intake/output data recorded.  PE: General: pleasant, WD,ill appearingmale who is laying in bed in NAD Lungs: Respiratory effort nonlabored Abd: soft, NT,distended,NGT with bilious drainage, IR drain with large amount bilious drainage   Lab Results:  Recent Labs    05/27/20 0355 05/28/20 0350  WBC 25.5* 23.3*  HGB 9.6* 8.8*  HCT 31.1* 28.6*  PLT 332 321   BMET Recent Labs    05/27/20 0355 05/28/20 0350  NA 142 142  K 4.2 3.8  CL 102 103  CO2 31 28  GLUCOSE 344* 297*  BUN 37* 41*  CREATININE 0.75 0.82  CALCIUM 8.4* 8.2*   PT/INR No results for input(s): LABPROT, INR in the last 72 hours. CMP     Component Value Date/Time   NA 142 05/28/2020 0350   K 3.8 05/28/2020 0350   CL 103 05/28/2020 0350   CO2 28 05/28/2020 0350   GLUCOSE 297 (H) 05/28/2020 0350   BUN 41 (H) 05/28/2020 0350   CREATININE 0.82 05/28/2020 0350   CALCIUM 8.2 (L) 05/28/2020 0350   PROT 5.7 (L) 05/28/2020 0350   ALBUMIN 1.4 (L) 05/28/2020 0350   AST 28 05/28/2020 0350   ALT 29 05/28/2020 0350   ALKPHOS 303 (H) 05/28/2020 0350   BILITOT 0.9 05/28/2020 0350   GFRNONAA >60 05/28/2020 0350   GFRAA >60 05/28/2020 0350   Lipase     Component Value Date/Time   LIPASE 27  05/16/2020 1905       Studies/Results: CT ABDOMEN PELVIS W CONTRAST  Result Date: 05/26/2020 CLINICAL DATA:  Low back pain.  Possible acute pancreatitis. EXAM: CT ABDOMEN AND PELVIS WITH CONTRAST TECHNIQUE: Multidetector CT imaging of the abdomen and pelvis was performed using the standard protocol following bolus administration of intravenous contrast. CONTRAST:  176m OMNIPAQUE IOHEXOL 300 MG/ML  SOLN COMPARISON:  05/17/2020 FINDINGS: Lower chest: Persistent bibasilar opacification with slight interval improvement which may be due to improving atelectasis or infection. Small to moderate size left pleural effusion without significant change. Central venous catheter tip seen over the SVC. Fluid is present over the distal esophagus which may be due to reflux. Hepatobiliary: Previous cholecystectomy. Pigtail drainage catheter tip in the region of the gallbladder fossa unchanged. Liver is otherwise unremarkable. Common bile duct stent unchanged. Pancreas: Stable moderate peripancreatic fluid extending into the gastrohepatic ligament unchanged. This fluid is fairly well-defined and could represent involving suggest cysts and less likely abscess. Spleen: Normal. Adrenals/Urinary Tract: Adrenal glands are normal. Kidneys are normal in size without hydronephrosis. Suggestion of minimal bilateral nephrolithiasis. Ureters and bladder are normal. Stomach/Bowel: Somewhat fluid distended stomach. Small bowel is unremarkable. Appendix is within normal. Mild diverticulosis of the colon. Vascular/Lymphatic: Minimal calcified plaque over the abdominal aorta. Aorta is normal in caliber. No adenopathy. Reproductive:  Normal. Other: Moderate ascites unchanged. Mild diffuse subcutaneous edema unchanged. Musculoskeletal: No change. IMPRESSION: 1. Stable moderate peripancreatic fluid extending into the gastrohepatic ligament unchanged. This fluid is fairly organized and could represent involving pseudocysts and less likely  abscess. Pigtail drainage catheter tip in the region of the gallbladder fossa unchanged. Common bile duct stent unchanged. 2. Persistent bibasilar opacification with slight interval improvement which may be due to improving atelectasis or infection. Stable small to moderate size left pleural effusion. 3. Moderate ascites unchanged. 4. Mild colonic diverticulosis. 5. Aortic atherosclerosis. Minimal bilateral nephrolithiasis. Aortic Atherosclerosis (ICD10-I70.0). Electronically Signed   By: Marin Olp M.D.   On: 05/26/2020 13:31   DG CHEST PORT 1 VIEW  Result Date: 05/27/2020 CLINICAL DATA:  Abdominal pain EXAM: PORTABLE CHEST 1 VIEW COMPARISON:  May 26, 2020, May 24, 2020 FINDINGS: The cardiomediastinal silhouette is unchanged in contour.RIGHT upper extremity PICC tip terminates over the superior cavoatrial junction. Percutaneous cholecystostomy tube. Biliary drain. Small LEFT pleural effusion. No pneumothorax. Persistent LEFT greater than RIGHT basilar heterogeneous opacities. Gaseous dilation of the stomach. No acute osseous abnormality. IMPRESSION: 1. Small LEFT pleural effusion. 2. Persistent LEFT greater than RIGHT basilar heterogeneous opacities. Differential considerations include atelectasis versus infection. Electronically Signed   By: Valentino Saxon MD   On: 05/27/2020 09:24   DG Abd Portable 1V  Result Date: 05/27/2020 CLINICAL DATA:  Nasogastric tube positioning. EXAM: PORTABLE ABDOMEN - 1 VIEW COMPARISON:  May 23, 2020 FINDINGS: Since the prior study there is been interval placement of a nasogastric tube, with its distal tip overlying the expected region of the body of the stomach. The bowel gas pattern is normal. Stable right-sided percutaneous pigtail catheter positioning is seen. A radiopaque stent is again seen overlying the right upper quadrant. No radio-opaque calculi or other significant radiographic abnormality are seen. IMPRESSION: Nasogastric tube positioning,  as described above. Electronically Signed   By: Virgina Norfolk M.D.   On: 05/27/2020 22:04    Anti-infectives: Anti-infectives (From admission, onward)   Start     Dose/Rate Route Frequency Ordered Stop   05/04/20 1230  meropenem (MERREM) 1 g in sodium chloride 0.9 % 100 mL IVPB  Status:  Discontinued        1 g 200 mL/hr over 30 Minutes Intravenous Every 8 hours 05/04/20 1156 05/19/20 0826   05/01/20 1400  piperacillin-tazobactam (ZOSYN) IVPB 3.375 g        3.375 g 12.5 mL/hr over 240 Minutes Intravenous Every 8 hours 05/01/20 1013 05/03/20 0907   04/29/20 2000  vancomycin (VANCOCIN) IVPB 1000 mg/200 mL premix  Status:  Discontinued        1,000 mg 200 mL/hr over 60 Minutes Intravenous Every 12 hours 04/29/20 0653 04/30/20 0831   04/29/20 0745  vancomycin (VANCOREADY) IVPB 1500 mg/300 mL        1,500 mg 150 mL/hr over 120 Minutes Intravenous  Once 04/29/20 0653 04/29/20 1129   04/26/20 2300  piperacillin-tazobactam (ZOSYN) IVPB 3.375 g        3.375 g 12.5 mL/hr over 240 Minutes Intravenous Every 8 hours 04/26/20 1716 05/01/20 0559   04/26/20 1700  piperacillin-tazobactam (ZOSYN) IVPB 3.375 g        3.375 g 100 mL/hr over 30 Minutes Intravenous  Once 04/26/20 1655 04/26/20 1844       Assessment/Plan Hx subtotal cholecystectomy - Dr. Bobbye Morton - 01/24/2020 Bile leak with ERCP and post ERCP pancreatitis -HIDA showed persistent bile leak as expected - appreciate GI input, ERCP unsuccessful 9/9  secondary to severe gastritis and duodenitis with GOO - NGT replaced overnight  - CT 9/9 shows pancreatitiswith some organizing pancreatic collections, unchanged position of biliary stent, moderate ascites, improved colonic wall thickening - IR cholangiogram 9/10 - well-positioned normally functioning right upper quadrant drainagecatheter;this fistulous communication between the drainage catheter and the cystic duct appears to have nearly resolved and therefore next change or upsize of the  catheter was not felt to be necessary  - CT 9/18 shows some developing pseudocysts but without signs of infection, moderate ascites - biliary stent clearly not working currently as patient still having large volume drainage of bilious fluid from GB fossa drain. Need GI to re-evaluate if repeat ERCP could be attempted to evaluate stent and reposition vs replace. If GI unable to repeat ERCP at this time, will need IR to attempt PTC. If IR unable to attempt PTC here would recommend transfer to a tertiary care center such as [facility] or [facility] -NGT to LIWS, CLD VTE -Lovenox ID -Merrem stopped 9/11  LOS: 31 days    Norm Parcel , [facility] 05/28/2020, 9:42 AM Please see Amion for pager number during day hours 7:00am-4:30pm

## 2020-05-28 NOTE — Progress Notes (Addendum)
PROGRESS NOTE    Adam Fowler  LXB:[id number] DOB: 08-08-1956 DOA: 04/26/2020 PCP: Melony Overly, MD    Brief Narrative:  Adam Fowler is a 64 year old male with past medical history notable for essential hypertension who underwent ERCP on 04/25/2020 for biliary stent exchange for persistent postoperative bile leak related to recent cholecystectomy with abscess status post percutaneous drain in which he developed severe epigastric abdominal pain associate with nausea, vomiting.  CT abdomen/pelvis notable for changes consistent with acute pancreatitis without abscess or drainable fluid collection likely related to postoperative ERCP pancreatitis.  8/21-8/24: Rapid response for increased work of breathing and atrial fibrillation requiring invasive mechanical ventilation.  Treated for volume overload and aspiration pneumonia with antibiotic therapy with Zosyn, liberated from mechanical ventilation on 05/01/2020 and transition to nasal cannula.  8/25-9/3: Continues with leukocytosis, abdominal pain with repeat CT abdomen/pelvis with markedly diffuse pancreatic inflammation with fat stranding and free fluid.  Zosyn changed to meropenem on 8/27 and placed on tube feeds via NG tube for several days.  9/3-9/7: NG tube removed, patient was tolerating oral nutritional support.  But continue with high output biliary drain despite stent in appropriate location and stable liver enzymes.  Repeat CT abdomen/pelvis 05/13/2020 with signs/sequela of severe pancreatitis and stable positioning of internal biliary stents with no intrahepatic biliary duct dilation.  HIDA scan 05/15/2020 with persistent bile leak accumulating the lateral aspect of the right upper quadrant, paracolic gutter, patient with poor oral intake.  9/9: Unsuccessful ERCP, unable to pass beyond D1/D2 sweep, patient with significant gastric fluid retention related to functional outlet obstruction, NG tube reinserted and placed on intermittent suction.   Repeat abdomen/pelvis 9/9 with pancreatitis, pain peripancreatic fat stranding and peripancreatic fluid, percutaneous drain at the gallbladder fossa, unchanged blurry stent with no intrahepatic bladder dilation, bilateral pleural effusions.  9/10: PICC line placed for initiation of TPN.  9/14: NG tube removed accidentally by patient.  Had a bowel movement overnight.  9/15: Abdominal pain improved, NG tube removed overnight accidentally.  Had a bowel movement.  GI advancing to full liquid diet.  9/16: Patient complaining of shortness of breath overnight, improved with breathing treatment.  Currently oxygenating well on room air.  X-ray with possible small left pleural effusion and patchy airspace disease, otherwise unrevealing.  With bowel movement yesterday.  GI plans to continue full liquid diet with TPN today.  Continue to monitor output from drain.  9/17: Patient uncomfortable in appearance, continues with significant abdominal distention.  No bowel movements overnight, but 1 reported yesterday.  Not tolerating full liquid diet as much.  Complains of low back pain, review of EMR shows has only used 3 doses of IV Dilaudid past 24 hours and last dose of oral oxycodone on 05/23/2020.  Also complains of some shortness of breath with notable lower extremity edema.  Chest x-ray yesterday with diffuse mild diffuse patchy infiltrates.  Will check x-ray lumbar spine, give dose IV Lasix.  GI transitioning diet back to full liquids due to in toleration of full liquids.  Patient not progressing.  GI to present at multidisciplinary rounds.  May need NG tube replaced vs reevaluation by surgery.  9/18: Patient continues to complain of of low back pain radiating up his upper back. X-ray L-spine yesterday with mild degenerative changes, otherwise unrevealing. States pain is not controlled with oral medications, although has only used 1 dose of OxyIR on 9/15 and 9/17, and has utilized Dilaudid on four occasions over the  past 24 hours when it is  ordered every 3 hours. Continues with significant abdominal distention, no appreciable bowel sounds. Episode of hematemesis overnight. Still awaiting CT abdomen/pelvis which was ordered yesterday afternoon, still not performed. May need to reinsert NG tube for decompression. Await further recs per GI. Will increase OxyContin to 20 mg p.o. twice daily.  9/19: Patient continues with abdominal discomfort, distention and multiple emesis over the past 24 hours.  Repeat CT yesterday continues to demonstrate gastric outlet obstruction.  Discussed with GI, Dr. Silverio Decamp; and no other recommendations at this time, awaiting further recommendations of Dr. Mansouraty/Cirigliano will return to service tomorrow, and ultimately may need surgical intervention.  Will reinsert NG tube in place to low wall suction, return to n.p.o. status.  9/20: NG tube placed overnight with dark/coffee-ground material.  Abdominal and low back pain improved following decompression of gastric contents.  Unfortunately continues with gastric outlet obstruction and high output from biliary drain.  Seen by GI and hepatobiliary surgery this morning with recommendations of IR basement of PTC and nasojejunal feeding tube.  GI plans EGD tomorrow due to coffee-ground emesis.   Assessment & Plan:   Principal Problem:   Post-ERCP acute pancreatitis Active Problems:   Severe sepsis (HCC)   Polycythemia   AKI (acute kidney injury) ([place])   Metabolic acidosis   Acute respiratory failure with hypoxia (HCC)   Bilateral pleural effusion   Ileus (HCC)   Bile duct leak   Gastric outlet obstruction   Abdominal distention   ARDS (adult respiratory distress syndrome) ([place])   Biloma following surgery   Acute blood loss anemia   Acute post ERCP pancreatitis/ileus Bile leak following cholecystectomy, persistent Patient presenting following increased abdominal pain following ERCP.  Lipase 1379.  CT abdomen/pelvis notable for  pancreatitis.  Previous cholecystectomy with bile leak that has persisted despite bile duct stenting.  Gallbladder fossa drain in place placed by IR on 02/13/2020. Repeat CT A//P w/ contrast 9/18 with moderate peripancreatic fluid unchanged, organized fluid likely representing pseudocyst less likely abscess, pigtail drainage catheter in gallbladder fossa, common bile duct stent unchanged, moderate ascites. NG tube pulled out on 05/22/2020, diet advanced to full liquids by GI on 9/15, and not tolerating with return of emesis; replaced [facility]GT to [facility] 9/20. --[facility]/IR/Gen Surgery following, appreciate assistance --Continue TPN with pharmacy monitoring/dosing --Continue to monitor gallbladder fossa drain output, 340m past 24 hours --Hepatobiliary surgery recommending IR placement of PTC and a nasojejunal feeding tube  Coffee-ground emesis --GI plans EGD on 05/29/2020 --Protonix 40 mg IV every 12 hours --N.p.o.   Acute hypoxic respiratory failure: Resolved Bilateral pleural effusions On 04/28/2020, patient developed severe respiratory distress, rapid response patient was transferred to the ICU with mechanical ventilation.  Etiology volume overload with aspiration pneumonia.  Was successfully extubated on 05/01/2020.  Completed course of antibiotics with vancomycin, Zosyn and meropenem.  Supplemental oxygen has been titrated off and oxygenating well on room air.  CXR 9/16 with stable patchy bilateral airspace disease, left greater than right with suspicion of small left pleural effusion. --Strict I's and O's and daily weights --Supportive care  Acute renal failure: Resolved Patient presenting with creatinine 1.41.  Etiology likely poor oral intake coupled with acute pancreatitis as above.  Patient was treated with IV fluid hydration, now supported with TPN with further advancement of diet.  Now resolved with creatinine 0.82.  Hypokalemia Hypomagnesemia Repleted, currently on TPN.  Potassium 3.8  today --Continue to follow electrolytes closely daily  Paroxysmal atrial fibrillation, transient: Resolved Spontaneously converted to normal sinus rhythm,  will CHA2DS2-VASc score = 1, low risk of thrombosis.  Telemetry has been discontinued.  Chronic pain syndrome: On oxycodone 10 mg every 4 hours as needed for pain at home.  On x-ray 05/25/2020 with mild lumbar degenerative change, no acute abnormality. --Tylenol 650 mg every 6 hours scheduled --Increased OxyContin to 20 mg p.o. q12h on 9/18 --Lidocaine patch --OxyIR, 5 mg q4h prn for moderate breakthrough pain --Dilaudid 1 mg IV q3h prn severe breakthrough pain  Anemia of chronic disease Etiology likely secondary to malnutrition.  Hemoglobin 10.3, stable. --Continue to monitor hemoglobin, transfuse for hemoglobin less than 7.0.  Essential hypertension On lisinopril and HCTZ at home.  --Continue to hold home antihypertensives for now  Dyslipidemia: Continue pravastatin  Hyperglycemia Hemoglobin A1c 5.6. --Continue insulin therapy per TPN protocol and moderate sliding scale --CBGs Q4h  Moderate calorie protein malnutrition Nutrition Status: Nutrition Problem: Inadequate oral intake Etiology: acute illness Signs/Symptoms: NPO status Interventions: TPN, Ensure Enlive (each supplement provides 350kcal and 20 grams of protein) --Continue TPN, pharmacy consulted for assistance dosing/monitoring --Resume NPO 9/19  Depression/anxiety: Continue sertraline and trazodone   DVT prophylaxis: Lovenox Code Status: Full code Family Communication: Updated patient extensively at bedside  Disposition Plan:  Status is: Inpatient  Remains inpatient appropriate because:Ongoing active pain requiring inpatient pain management, Ongoing diagnostic testing needed not appropriate for outpatient work up, Unsafe d/c plan, IV treatments appropriate due to intensity of illness or inability to take PO and Inpatient level of care appropriate due to  severity of illness continues on clear liquid diet, remains on TPN   Dispo:  Patient From: [facility]  Planned Disposition: Home  Expected discharge date: 06/01/20  Medically stable for discharge: NoNo   Consultants:   [facility]  Interventional radiology  General surgery - signed off 05/18/2020  Procedures:   ERCP 9/9  Antimicrobials:   Zosyn 8/19-8/26  Meropenem 8/27 - 9/11  Vancomycin 8/22 - 8/23   Subjective: Patient seen and examined bedside, slightly uncomfortable in appearance, but appears to be less comfortable today.  NG tube placed overnight with decompression of gastric contents.  No further hematemesis following placement of NG tube.  Patient upset about how he was treated overnight by nursing staff.  Patient's spouse present at bedside and updated.  Discussed with GI and hepatobiliary surgery, recommend IR placement of PTC with nasojejunal feeding tube.  Also GI planning on EGD in the a.m for evaluation of coffee-ground emesis.  Patient without any other complaints or concerns at this time. Denies headache, no visual changes, no palpitations, no diarrhea, no fever/chills/night sweats.  No other acute events overnight per nursing staff.  Objective: Vitals:   05/27/20 1711 05/28/20 0257 05/28/20 0500 05/28/20 0855  BP: (!) 140/95 126/85  125/84  Pulse: (!) 106 98  80  Resp: 16 19  20   Temp: 97.8 F (36.6 C) 98.7 F (37.1 C)  99 F (37.2 C)  TempSrc: Oral Oral  Oral  SpO2: 93% 99%  92%  Weight:   83.5 kg   Height:        Intake/Output Summary (Last 24 hours) at 05/28/2020 1331 Last data filed at 05/28/2020 0800 Gross per 24 hour  Intake --  Output 1650 ml  Net -1650 ml   Filed Weights   05/24/20 0500 05/26/20 0500 05/28/20 0500  Weight: 86.3 kg 84.6 kg 83.5 kg    Examination:  General exam: Uncomfortable in appearance, anxious, chronically ill appearance, NG tube noted in place to low wall suction Respiratory system: Clear  to auscultation.  Respiratory effort normal.  Oxygenating well on room air Cardiovascular system: S1 & S2 heard, RRR. No JVD, murmurs, rubs, gallops or clicks.  1+ pedal edema bilateral lower extremities to mid shin Gastrointestinal system: Abdomen is distended, with mild global tenderness. No organomegaly or masses felt. Bowel sounds absent Central nervous system: Alert and oriented. No focal neurological deficits. Extremities: Symmetric 5 x 5 power. Skin: No rashes, lesions or ulcers Psychiatry: Judgement and insight appear normal.  Depressed mood & flat affect.    Data Reviewed: I have personally reviewed following labs and imaging studies  CBC: Recent Labs  Lab 05/22/20 1337 05/22/20 1337 05/23/20 0455 05/25/20 0359 05/26/20 1630 05/27/20 0355 05/28/20 0350  WBC 28.8*   < > 23.1* 23.6* 29.6* 25.5* 23.3*  NEUTROABS 24.1*  --  18.4*  --   --   --  19.0*  HGB 11.1*   < > 10.3* 10.3* 10.4* 9.6* 8.8*  HCT 34.7*   < > 33.1* 33.1* 34.4* 31.1* 28.6*  MCV 87.6   < > 87.6 88.5 90.5 90.4 90.2  PLT 278   < > 235 263 320 332 321   < > = values in this interval not displayed.   Basic Metabolic Panel: Recent Labs  Lab 05/24/20 0405 05/25/20 0359 05/26/20 0349 05/27/20 0355 05/28/20 0350  NA 135 136 141 142 142  K 3.9 4.3 4.1 4.2 3.8  CL 104 103 104 102 103  CO2 24 24 28 31 28   GLUCOSE 209* 186* 320* 344* 297*  BUN 20 20 28* 37* 41*  CREATININE 0.66 0.58* 0.61 0.75 0.82  CALCIUM 8.6* 8.6* 8.3* 8.4* 8.2*  MG 2.0 2.1 2.1 2.1 2.1  PHOS 3.7 3.9 3.6 3.9 3.2   GFR: Estimated Creatinine Clearance: 91 mL/min (by C-G formula based on SCr of 0.82 mg/dL). Liver Function Tests: Recent Labs  Lab 05/22/20 0424 05/24/20 0405 05/28/20 0350  AST 20 20 28   ALT 16 17 29   ALKPHOS 276* 220* 303*  BILITOT 0.6 0.7 0.9  PROT 5.4* 5.7* 5.7*  ALBUMIN 1.6* 1.6* 1.4*   No results for input(s): LIPASE, AMYLASE in the last 168 hours. No results for input(s): AMMONIA in the last 168 hours. Coagulation  Profile: No results for input(s): INR, PROTIME in the last 168 hours. Cardiac Enzymes: No results for input(s): CKTOTAL, CKMB, CKMBINDEX, TROPONINI in the last 168 hours. BNP (last 3 results) No results for input(s): PROBNP in the last 8760 hours. HbA1C: No results for input(s): HGBA1C in the last 72 hours. CBG: Recent Labs  Lab 05/27/20 1510 05/28/20 0238 05/28/20 0430 05/28/20 0853 05/28/20 1115  GLUCAP 140* 267* 251* 244* 194*   Lipid Profile: Recent Labs    05/28/20 0350  TRIG 162*   Thyroid Function Tests: No results for input(s): TSH, T4TOTAL, FREET4, T3FREE, THYROIDAB in the last 72 hours. Anemia Panel: No results for input(s): VITAMINB12, FOLATE, FERRITIN, TIBC, IRON, RETICCTPCT in the last 72 hours. Sepsis Labs: No results for input(s): PROCALCITON, LATICACIDVEN in the last 168 hours.  Recent Results (from the past 240 hour(s))  Culture, body fluid-bottle     Status: None   Collection Time: 05/18/20  5:13 PM   Specimen: Fluid  Result Value Ref Range Status   Specimen Description FLUID PERITONEAL  Final   Special Requests BOTTLES DRAWN AEROBIC AND ANAEROBIC  Final   Culture   Final    NO GROWTH 5 DAYS Performed at [facility], [place] [address] [place], [place]  [address]    Report Status 05/23/2020 FINAL  Final  Gram stain     Status: None   Collection Time: 05/18/20  5:13 PM   Specimen: Fluid  Result Value Ref Range Status   Specimen Description FLUID PERITONEAL  Final   Special Requests NONE  Final   Gram Stain   Final    WBC PRESENT, PREDOMINANTLY MONONUCLEAR NO ORGANISMS SEEN CYTOSPIN SMEAR Performed at [facility], [place] [address] [place], [place] [address]    Report Status 05/19/2020 FINAL  Final         Radiology Studies: DG CHEST PORT 1 VIEW  Result Date: 05/27/2020 CLINICAL DATA:  Abdominal pain EXAM: PORTABLE CHEST 1 VIEW COMPARISON:  May 26, 2020, May 24, 2020 FINDINGS: The cardiomediastinal silhouette is  unchanged in contour.RIGHT upper extremity PICC tip terminates over the superior cavoatrial junction. Percutaneous cholecystostomy tube. Biliary drain. Small LEFT pleural effusion. No pneumothorax. Persistent LEFT greater than RIGHT basilar heterogeneous opacities. Gaseous dilation of the stomach. No acute osseous abnormality. IMPRESSION: 1. Small LEFT pleural effusion. 2. Persistent LEFT greater than RIGHT basilar heterogeneous opacities. Differential considerations include atelectasis versus infection. Electronically Signed   By: Valentino Saxon MD   On: 05/27/2020 09:24   DG Abd Portable 1V  Result Date: 05/27/2020 CLINICAL DATA:  Nasogastric tube positioning. EXAM: PORTABLE ABDOMEN - 1 VIEW COMPARISON:  May 23, 2020 FINDINGS: Since the prior study there is been interval placement of a nasogastric tube, with its distal tip overlying the expected region of the body of the stomach. The bowel gas pattern is normal. Stable right-sided percutaneous pigtail catheter positioning is seen. A radiopaque stent is again seen overlying the right upper quadrant. No radio-opaque calculi or other significant radiographic abnormality are seen. IMPRESSION: Nasogastric tube positioning, as described above. Electronically Signed   By: Virgina Norfolk M.D.   On: 05/27/2020 22:04      Scheduled Meds: . acetaminophen  650 mg Oral Q6H  . acidophilus  2 capsule Oral Daily  . Chlorhexidine Gluconate Cloth  6 each Topical Daily  . [START ON 05/29/2020] enoxaparin (LOVENOX) injection  40 mg Subcutaneous Q24H  . influenza vac split quadrivalent PF  0.5 mL Intramuscular Tomorrow-1000  . insulin aspart  0-15 Units Subcutaneous 4 times per day  . mouth rinse  15 mL Mouth Rinse BID  . oxyCODONE  20 mg Oral Q12H  . pantoprazole (PROTONIX) IV  40 mg Intravenous Q12H  . polyethylene glycol  17 g Oral Daily  . pravastatin  20 mg Oral q1800  . sertraline  100 mg Oral Daily  . sodium chloride flush  10-40 mL  Intracatheter Q12H  . traZODone  50 mg Oral QHS   Continuous Infusions: . sodium chloride Stopped (05/07/20 0923)  . [START ON 05/29/2020] piperacillin-tazobactam (ZOSYN)  IV    . potassium chloride 10 mEq (05/28/20 1319)  . TPN CYCLIC-ADULT (ION)       LOS: 31 days    Time spent: 38 minutes spent on chart review, discussion with nursing staff, consultants, updating family and interview/physical exam; more than 50% of that time was spent in counseling and/or coordination of care.    Pierina Schuknecht J [place] DO [facility] Available via Epic secure chat 7am-7pm After these hours, please refer to coverage provider listed on [email or web address] 05/28/2020, 1:31 PM

## 2020-05-28 NOTE — Progress Notes (Addendum)
PHARMACY - TOTAL PARENTERAL NUTRITION CONSULT NOTE  Indication:  Severe pancreatitis, biliary fistula  Patient Measurements: Height: 5' 9"  (175.3 cm) Weight: 83.5 kg (184 lb 1.4 oz) IBW/kg (Calculated) : 70.7 TPN AdjBW (KG): 86.1 Body mass index is 27.18 kg/m.  Assessment:  71 yom admitted 04/26/20, post-ERCP 8/18 and biliary stent exchange for persistent postoperative bile leak related to recent cholecystectomy with abscess s/p percutaneous drainage. Patient developed acute pancreatitis, has had a prolonged hospitalization complicated by VDRF (extubated 8/24), AKI, encephalopathy, and afib. TPN initiated 9/11 for acute post-ERCP pancreatitis/ileus and persistent biliary leak. Significant gastric fluid retention from functional outlet obstruction. Pt still with very poor PO intake.  9/15 - Advanced to FLD 9/17 - Pt reports minimal PO intake exacerbates severe back pain, refusing PO supplements 9/18 - Large coffee-ground emesis overnight, given phenergan 9/19 - Reinserted NGT to [facility] d/t intolerance and N/V, GOO on repeat CT >> NPO status  9/20 - Reported N/V yesterday, none today. Having back/abdominal pain  Glucose / Insulin: A1c 5.6% - CBGs remain uncontrolled in 300s on TPN. CBG 140 off TPN. 15 units mSSI utilized in last 24 hrs, 15 units regular insulin in TPN.  Electrolytes: corr Ca high-normal ~10.3; others WNL Renal: SCr 0.82, BUN up to 41. Lasix 74m IV BID ordered 9/17 x 4 doses (last today) LFTs / TGs: LFTs/Tbili WNL. Alk phos 303 (up), TG 162 Prealbumin / albumin: Prealbumin 8.2 (up). Albumin 1.4 (down) Intake / Output; MIVF: UOP 0.333mkg/hr. LBM 9/17 (significant stool output noted per GI 9/17 after no significant stool for 2 weeks). Drain output- none documented. Emesis [id number]/O -[id number] admit GI Imaging:  9/5: CT - severe pancreatitis 9/7: HIDA - persistent bile leak with accumulations/paracolic gutter 9/9: CT - pancreatitis (peripancreatic fat stranding and  peripancreatic fluid, percutaneous drain at the gallbladder fossa, unchanged biliary stent with no intrahepatic biliary duct dilatation, bilateral pleural effusions 9/15: KUB - diffuse colonic stool, no bowel obstruction/free air 9/17: X-ray lumbar spine - no acute abnormality 9/18: CT abd/pelvis - mild colonic diverticulosis, peripancreatic fluid indicative of pseudocyst involvement and less likely abscess, otherwise unchanged Surgeries / Procedures: 8/18: ERCP/biliary stent exchange 9/9: Unsuccessful ERCP 9/10: Paracentesis - 1.4L out 9/19: NGT reinserted   Central access: PICC placed 05/19/20 TPN start date: 05/19/20  Nutritional Goals (per RD rec on 05/18/20): kCal: 2300-2500, Protein: 130-145g, Fluid: >2.3L/day  Current Nutrition: TPN  Plan:  Extend cyclic TPN to 18-h33-HLle in an effort to control blood glucose.  Adjust TPN formula to maximize protein provision in setting of decreasing prealbumin (overall).  Infuse 2398 ml over 18 hrs:  71 ml/hr x 1, then 141 ml/hr x 16 hrs, then 71 ml/hr x 1 hr TPN will provide 144g AA, 312g CHO and 72g ILE for a total of 2355 kCal, meeting 100% of needs Electrolytes in TPN: Continue Na 100mE58m K 35 mEq/L, Mag 6mEq/55mPhos 4mmol/30mnd Ca 0mEq/L.71m:Ac 1:1.  Add standard MVI and trace elements to TPN Continue mSSI 4x/d + increase insulin in TPN to 25 units KCL x 3 runs to account for Lasix.  Monitor further diuresis plans. F/u AM labs, GI / IR plans, CBGs and tolerance to 18-hr [id number]  Sarah RiRedgie Grayercy Student 05/28/2020 7:48 AM

## 2020-05-28 NOTE — H&P (View-Only) (Signed)
Daily Rounding Note  05/28/2020, 9:25 AM  LOS: 31 days   SUBJECTIVE:   Chief complaint:  Persistent bile leak post op   rough weekend w N/V, some CGE?.  Dr [place] ordered NGT placed. 1150 mL of output from biliary drain yesterday.   900 mL of NGT drainage yesterday.   TPN continues.     OBJECTIVE:         Vital signs in last 24 hours:    Temp:  [97.8 F (36.6 C)-99 F (37.2 C)] 99 F (37.2 C) (09/20 0855) Pulse Rate:  [80-106] 80 (09/20 0855) Resp:  [16-20] 20 (09/20 0855) BP: (125-140)/(84-95) 125/84 (09/20 0855) SpO2:  [92 %-99 %] 92 % (09/20 0855) Weight:  [83.5 kg] 83.5 kg (09/20 0500) Last BM Date: 05/25/20 Filed Weights   05/24/20 0500 05/26/20 0500 05/28/20 0500  Weight: 86.3 kg 84.6 kg 83.5 kg   General: tremulous, looks unwell and miserable.    Heart: Reg, tachy Chest: some dry crackles at base.  Some labored breathing Abdomen: tense, NT.  Brown dark bilious output to per drain.  BS scant  Extremities: bil pitting pedal edema.   Neuro/Psych:  Oriented x 3.  Anxious, fretful.  Weak.    Intake/Output from previous day: 09/19 0701 - 09/20 0700 In: -  Out: 1800 [Urine:600; Emesis/NG output:900]  Intake/Output this shift: No intake/output data recorded.  Lab Results: Recent Labs    05/26/20 1630 05/27/20 0355 05/28/20 0350  WBC 29.6* 25.5* 23.3*  HGB 10.4* 9.6* 8.8*  HCT 34.4* 31.1* 28.6*  PLT 320 332 321   BMET Recent Labs    05/26/20 0349 05/27/20 0355 05/28/20 0350  NA 141 142 142  K 4.1 4.2 3.8  CL 104 102 103  CO2 _0 GLUCOSE 320* 344* 297*  BUN 28* 37* 41*  CREATININE 0.61 0.75 0.82  CALCIUM 8.3* 8.4* 8.2*   LFT Recent Labs    05/28/20 0350  PROT 5.7*  ALBUMIN 1.4*  AST 28  ALT 29  ALKPHOS 303*  BILITOT 0.9   PT/INR No results for input(s): LABPROT, INR in the last 72 hours. Hepatitis Panel No results for input(s): HEPBSAG, HCVAB, HEPAIGM, HEPBIGM in the  last 72 hours.  Studies/Results: CT ABDOMEN PELVIS W CONTRAST  Result Date: 05/26/2020 FINDINGS: Lower chest: Persistent bibasilar opacification with slight interval improvement which may be due to improving atelectasis or infection. Small to moderate size left pleural effusion without significant change. Central venous catheter tip seen over the SVC. Fluid is present over the distal esophagus which may be due to reflux. Hepatobiliary: Previous cholecystectomy. Pigtail drainage catheter tip in the region of the gallbladder fossa unchanged. Liver is otherwise unremarkable. Common bile duct stent unchanged. Pancreas: Stable moderate peripancreatic fluid extending into the gastrohepatic ligament unchanged. This fluid is fairly well-defined and could represent involving suggest cysts and less likely abscess. Spleen: Normal. Adrenals/Urinary Tract: Adrenal glands are normal. Kidneys are normal in size without hydronephrosis. Suggestion of minimal bilateral nephrolithiasis. Ureters and bladder are normal. Stomach/Bowel: Somewhat fluid distended stomach. Small bowel is unremarkable. Appendix is within normal. Mild diverticulosis of the colon. Vascular/Lymphatic: Minimal calcified plaque over the abdominal aorta. Aorta is normal in caliber. No adenopathy. Reproductive: Normal. Other: Moderate ascites unchanged. Mild diffuse subcutaneous edema unchanged. Musculoskeletal: No change. IMPRESSION: 1. Stable moderate peripancreatic fluid extending into the gastrohepatic ligament unchanged. This fluid is fairly organized and could represent involving pseudocysts and less likely abscess. Pigtail  drainage catheter tip in the region of the gallbladder fossa unchanged. Common bile duct stent unchanged. 2. Persistent bibasilar opacification with slight interval improvement which may be due to improving atelectasis or infection. Stable small to moderate size left pleural effusion. 3. Moderate ascites unchanged. 4. Mild colonic  diverticulosis. 5. Aortic atherosclerosis. Minimal bilateral nephrolithiasis. Aortic Atherosclerosis (ICD10-I70.0). Electronically Signed   By: Marin Olp M.D.   On: 05/26/2020 13:31   DG CHEST PORT 1 VIEW  Result Date: 05/27/2020 FINDINGS: The cardiomediastinal silhouette is unchanged in contour.RIGHT upper extremity PICC tip terminates over the superior cavoatrial junction. Percutaneous cholecystostomy tube. Biliary drain. Small LEFT pleural effusion. No pneumothorax. Persistent LEFT greater than RIGHT basilar heterogeneous opacities. Gaseous dilation of the stomach. No acute osseous abnormality. IMPRESSION: 1. Small LEFT pleural effusion. 2. Persistent LEFT greater than RIGHT basilar heterogeneous opacities. Differential considerations include atelectasis versus infection. Electronically Signed   By: Valentino Saxon MD   On: 05/27/2020 09:24   DG Abd Portable 1V  Result Date: 05/27/2020 CLINICAL DATA:  Nasogastric tube positioning. EXAM: PORTABLE ABDOMEN - 1 VIEW COMPARISON:  May 23, 2020 FINDINGS: Since the prior study there is been interval placement of a nasogastric tube, with its distal tip overlying the expected region of the body of the stomach. The bowel gas pattern is normal. Stable right-sided percutaneous pigtail catheter positioning is seen. A radiopaque stent is again seen overlying the right upper quadrant. No radio-opaque calculi or other significant radiographic abnormality are seen. IMPRESSION: Nasogastric tube positioning, as described above. Electronically Signed   By: Virgina Norfolk M.D.   On: 05/27/2020 22:04   Scheduled Meds:  acetaminophen  650 mg Oral Q6H   acidophilus  2 capsule Oral Daily   Chlorhexidine Gluconate Cloth  6 each Topical Daily   enoxaparin (LOVENOX) injection  40 mg Subcutaneous Q24H   influenza vac split quadrivalent PF  0.5 mL Intramuscular Tomorrow-1000   insulin aspart  0-15 Units Subcutaneous 4 times per day   mouth rinse  15 mL  Mouth Rinse BID   oxyCODONE  20 mg Oral Q12H   pantoprazole (PROTONIX) IV  40 mg Intravenous Q12H   polyethylene glycol  17 g Oral Daily   pravastatin  20 mg Oral q1800   sertraline  100 mg Oral Daily   sodium chloride flush  10-40 mL Intracatheter Q12H   traZODone  50 mg Oral QHS   Continuous Infusions:  sodium chloride Stopped (05/07/20 0923)   PRN Meds:.bisacodyl, HYDROmorphone (DILAUDID) injection, ipratropium-albuterol, lidocaine, LORazepam, metoprolol tartrate, oxyCODONE, polyvinyl alcohol, promethazine, sodium chloride flush, sodium chloride flush   ASSESMENT:   *   Bile leak post cholecystectomy Failed to resolve despite ERCP and stenting x 2, with upsized metal stent placed in 04/2020 Large volume bile output persists vial perc drain  *   Post ERCP pancreatitis.  Organizing pseudocyst per latest CTAP  *   Normocytic anemia.  Hgb drop 10.4 >> 8.8 in last 3 d.    *   Azotemia.  BUN rise >> creat rise  *    GOO.    *   Bil pleural effusion.    *   IDDM.    *   Chronic pain syndrome.  On narcotics at home.  Now on oxycontin, lidocaine patch, scheduled tylenol, prn oxy ir, prn  *   Depression, anxiety.    PLAN   *    Dr Michaelle Birks, surgeon, will see pt today.  Feels pt needs PTC and will  reach out to IR.    Azucena Freed  05/28/2020, 9:25 AM Phone [phone]

## 2020-05-29 ENCOUNTER — Inpatient Hospital Stay (HOSPITAL_COMMUNITY): Payer: 59

## 2020-05-29 ENCOUNTER — Encounter (HOSPITAL_COMMUNITY): Payer: Self-pay | Admitting: Internal Medicine

## 2020-05-29 ENCOUNTER — Inpatient Hospital Stay (HOSPITAL_COMMUNITY): Payer: 59 | Admitting: Anesthesiology

## 2020-05-29 ENCOUNTER — Encounter (HOSPITAL_COMMUNITY)
Admission: EM | Disposition: A | Discharge status: Hospice | Payer: Self-pay | Source: Home / Self Care | Attending: Internal Medicine

## 2020-05-29 DIAGNOSIS — K2971 Gastritis, unspecified, with bleeding: Secondary | ICD-10-CM

## 2020-05-29 DIAGNOSIS — K2091 Esophagitis, unspecified with bleeding: Secondary | ICD-10-CM

## 2020-05-29 HISTORY — PX: ESOPHAGOGASTRODUODENOSCOPY (EGD) WITH PROPOFOL: SHX5813

## 2020-05-29 HISTORY — PX: IR PARACENTESIS: IMG2679

## 2020-05-29 HISTORY — PX: IR CHOLANGIOGRAM EXISTING TUBE: IMG6040

## 2020-05-29 HISTORY — PX: IR INT EXT BILIARY DRAIN WITH CHOLANGIOGRAM: IMG6044

## 2020-05-29 LAB — CBC
HCT: 29.5 % — ABNORMAL LOW (ref 39.0–52.0)
Hemoglobin: 8.6 g/dL — ABNORMAL LOW (ref 13.0–17.0)
MCH: 26.8 pg (ref 26.0–34.0)
MCHC: 29.2 g/dL — ABNORMAL LOW (ref 30.0–36.0)
MCV: 91.9 fL (ref 80.0–100.0)
Platelets: 304 10*3/uL (ref 150–400)
RBC: 3.21 MIL/uL — ABNORMAL LOW (ref 4.22–5.81)
RDW: 15.5 % (ref 11.5–15.5)
WBC: 23.6 10*3/uL — ABNORMAL HIGH (ref 4.0–10.5)
nRBC: 0 % (ref 0.0–0.2)

## 2020-05-29 LAB — BASIC METABOLIC PANEL
Anion gap: 10 (ref 5–15)
BUN: 39 mg/dL — ABNORMAL HIGH (ref 8–23)
CO2: 27 mmol/L (ref 22–32)
Calcium: 8.3 mg/dL — ABNORMAL LOW (ref 8.9–10.3)
Chloride: 109 mmol/L (ref 98–111)
Creatinine, Ser: 0.92 mg/dL (ref 0.61–1.24)
GFR calc Af Amer: >60 mL/min (ref >60)
GFR calc non Af Amer: >60 mL/min (ref >60)
Glucose, Bld: 203 mg/dL — ABNORMAL HIGH (ref 70–99)
Potassium: 3.7 mmol/L (ref 3.5–5.1)
Sodium: 146 mmol/L — ABNORMAL HIGH (ref 135–145)

## 2020-05-29 LAB — GLUCOSE, CAPILLARY
Glucose-Capillary: 165 mg/dL — ABNORMAL HIGH (ref 70–99)
Glucose-Capillary: 166 mg/dL — ABNORMAL HIGH (ref 70–99)
Glucose-Capillary: 180 mg/dL — ABNORMAL HIGH (ref 70–99)
Glucose-Capillary: 228 mg/dL — ABNORMAL HIGH (ref 70–99)
Glucose-Capillary: 268 mg/dL — ABNORMAL HIGH (ref 70–99)

## 2020-05-29 LAB — PROTIME-INR
INR: 1.4 — ABNORMAL HIGH (ref 0.8–1.2)
Prothrombin Time: 16.4 seconds — ABNORMAL HIGH (ref 11.4–15.2)

## 2020-05-29 SURGERY — ESOPHAGOGASTRODUODENOSCOPY (EGD) WITH PROPOFOL
Anesthesia: General

## 2020-05-29 MED ORDER — ONDANSETRON HCL 4 MG/2ML IJ SOLN
INTRAMUSCULAR | Status: DC | PRN
  Administered 2020-05-29: 4 mg via INTRAVENOUS

## 2020-05-29 MED ORDER — ONDANSETRON HCL 4 MG/2ML IJ SOLN
INTRAMUSCULAR | Status: AC
  Filled 2020-05-29: qty 2

## 2020-05-29 MED ORDER — FENTANYL CITRATE (PF) 100 MCG/2ML IJ SOLN
INTRAMUSCULAR | Status: DC | PRN
Start: 2020-05-29 — End: 2020-05-29
  Administered 2020-05-29: 50 ug via INTRAVENOUS

## 2020-05-29 MED ORDER — ONDANSETRON HCL 4 MG/2ML IJ SOLN: 4.0000 mg | Freq: Once | INTRAMUSCULAR | Status: DC | PRN

## 2020-05-29 MED ORDER — FENTANYL CITRATE (PF) 100 MCG/2ML IJ SOLN
INTRAMUSCULAR | Status: AC | PRN
  Administered 2020-05-29: 12.5 ug via INTRAVENOUS
  Administered 2020-05-29: 25 ug via INTRAVENOUS
  Administered 2020-05-29 (×3): 12.5 ug via INTRAVENOUS

## 2020-05-29 MED ORDER — PROPOFOL 10 MG/ML IV BOLUS
INTRAVENOUS | Status: DC | PRN
  Administered 2020-05-29: 150 mg via INTRAVENOUS

## 2020-05-29 MED ORDER — SODIUM CHLORIDE 0.9% FLUSH
5.0000 mL | Freq: Three times a day (TID) | INTRAVENOUS | Status: DC
  Administered 2020-05-30 – 2020-06-13 (×34): 5 mL

## 2020-05-29 MED ORDER — MIDAZOLAM HCL 2 MG/2ML IJ SOLN
INTRAMUSCULAR | Status: AC | PRN
  Administered 2020-05-29 (×4): 0.5 mg via INTRAVENOUS

## 2020-05-29 MED ORDER — ONDANSETRON HCL 4 MG/2ML IJ SOLN
INTRAMUSCULAR | Status: AC | PRN
  Administered 2020-05-29: 4 mg via INTRAVENOUS

## 2020-05-29 MED ORDER — SODIUM CHLORIDE 0.9 % IV SOLN: 8.0000 mg | Freq: Once | INTRAVENOUS | Status: DC

## 2020-05-29 MED ORDER — SUCCINYLCHOLINE CHLORIDE 200 MG/10ML IV SOSY
PREFILLED_SYRINGE | INTRAVENOUS | Status: DC | PRN
  Administered 2020-05-29: 160 mg via INTRAVENOUS

## 2020-05-29 MED ORDER — DEXAMETHASONE SODIUM PHOSPHATE 10 MG/ML IJ SOLN
INTRAMUSCULAR | Status: DC | PRN
  Administered 2020-05-29: 5 mg via INTRAVENOUS

## 2020-05-29 MED ORDER — POTASSIUM CHLORIDE 10 MEQ/50ML IV SOLN
10.0000 meq | INTRAVENOUS | Status: DC
  Filled 2020-05-29 (×6): qty 50

## 2020-05-29 MED ORDER — LIDOCAINE HCL 1 % IJ SOLN
INTRAMUSCULAR | Status: AC
  Filled 2020-05-29: qty 20

## 2020-05-29 MED ORDER — LIDOCAINE 2% (20 MG/ML) 5 ML SYRINGE
INTRAMUSCULAR | Status: DC | PRN
  Administered 2020-05-29: 100 mg via INTRAVENOUS

## 2020-05-29 MED ORDER — TRAVASOL 10 % IV SOLN
INTRAVENOUS | Status: AC
  Filled 2020-05-29: qty 1438.8

## 2020-05-29 MED ORDER — FENTANYL CITRATE (PF) 100 MCG/2ML IJ SOLN
INTRAMUSCULAR | Status: AC
  Filled 2020-05-29: qty 2

## 2020-05-29 MED ORDER — ONDANSETRON HCL 4 MG/2ML IJ SOLN
4.0000 mg | Freq: Once | INTRAMUSCULAR | Status: AC
  Administered 2020-05-29: 4 mg via INTRAVENOUS

## 2020-05-29 MED ORDER — MIDAZOLAM HCL 2 MG/2ML IJ SOLN
INTRAMUSCULAR | Status: AC
  Filled 2020-05-29: qty 2

## 2020-05-29 MED ORDER — LIDOCAINE HCL 1 % IJ SOLN
INTRAMUSCULAR | Status: AC | PRN
  Administered 2020-05-29: 10 mL via INTRADERMAL

## 2020-05-29 MED ORDER — ONDANSETRON HCL 4 MG/2ML IJ SOLN
INTRAMUSCULAR | Status: AC
  Filled 2020-05-29: qty 4

## 2020-05-29 MED ORDER — IOHEXOL 300 MG/ML  SOLN
50.0000 mL | Freq: Once | INTRAMUSCULAR | Status: AC | PRN
  Administered 2020-05-29: 20 mL

## 2020-05-29 MED ORDER — LACTATED RINGERS IV SOLN: INTRAVENOUS | Status: DC | PRN

## 2020-05-29 SURGICAL SUPPLY — 15 items

## 2020-05-29 NOTE — Progress Notes (Addendum)
PROGRESS NOTE    Adam Fowler  DCV:[id number] DOB: 23-May-1956 DOA: 04/26/2020 PCP: Melony Overly, MD    Brief Narrative:  Adam Fowler is a 64 year old male with past medical history notable for essential hypertension who underwent ERCP on 04/25/2020 for biliary stent exchange for persistent postoperative bile leak related to recent cholecystectomy with abscess status post percutaneous drain in which he developed severe epigastric abdominal pain associate with nausea, vomiting.  CT abdomen/pelvis notable for changes consistent with acute pancreatitis without abscess or drainable fluid collection likely related to postoperative ERCP pancreatitis.  8/21-8/24: Rapid response for increased work of breathing and atrial fibrillation requiring invasive mechanical ventilation.  Treated for volume overload and aspiration pneumonia with antibiotic therapy with Zosyn, liberated from mechanical ventilation on 05/01/2020 and transition to nasal cannula.  8/25-9/3: Continues with leukocytosis, abdominal pain with repeat CT abdomen/pelvis with markedly diffuse pancreatic inflammation with fat stranding and free fluid.  Zosyn changed to meropenem on 8/27 and placed on tube feeds via NG tube for several days.  9/3-9/7: NG tube removed, patient was tolerating oral nutritional support.  But continue with high output biliary drain despite stent in appropriate location and stable liver enzymes.  Repeat CT abdomen/pelvis 05/13/2020 with signs/sequela of severe pancreatitis and stable positioning of internal biliary stents with no intrahepatic biliary duct dilation.  HIDA scan 05/15/2020 with persistent bile leak accumulating the lateral aspect of the right upper quadrant, paracolic gutter, patient with poor oral intake.  9/9: Unsuccessful ERCP, unable to pass beyond D1/D2 sweep, patient with significant gastric fluid retention related to functional outlet obstruction, NG tube reinserted and placed on intermittent suction.   Repeat abdomen/pelvis 9/9 with pancreatitis, pain peripancreatic fat stranding and peripancreatic fluid, percutaneous drain at the gallbladder fossa, unchanged blurry stent with no intrahepatic bladder dilation, bilateral pleural effusions.  9/10: PICC line placed for initiation of TPN.  9/14: NG tube removed accidentally by patient.  Had a bowel movement overnight.  9/15: Abdominal pain improved, NG tube removed overnight accidentally.  Had a bowel movement.  GI advancing to full liquid diet.  9/16: Patient complaining of shortness of breath overnight, improved with breathing treatment.  Currently oxygenating well on room air.  X-ray with possible small left pleural effusion and patchy airspace disease, otherwise unrevealing.  With bowel movement yesterday.  GI plans to continue full liquid diet with TPN today.  Continue to monitor output from drain.  9/17: Patient uncomfortable in appearance, continues with significant abdominal distention.  No bowel movements overnight, but 1 reported yesterday.  Not tolerating full liquid diet as much.  Complains of low back pain, review of EMR shows has only used 3 doses of IV Dilaudid past 24 hours and last dose of oral oxycodone on 05/23/2020.  Also complains of some shortness of breath with notable lower extremity edema.  Chest x-ray yesterday with diffuse mild diffuse patchy infiltrates.  Will check x-ray lumbar spine, give dose IV Lasix.  GI transitioning diet back to full liquids due to in toleration of full liquids.  Patient not progressing.  GI to present at multidisciplinary rounds.  May need NG tube replaced vs reevaluation by surgery.  9/18: Patient continues to complain of of low back pain radiating up his upper back. X-ray L-spine yesterday with mild degenerative changes, otherwise unrevealing. States pain is not controlled with oral medications, although has only used 1 dose of OxyIR on 9/15 and 9/17, and has utilized Dilaudid on four occasions over the  past 24 hours when it is  ordered every 3 hours. Continues with significant abdominal distention, no appreciable bowel sounds. Episode of hematemesis overnight. Still awaiting CT abdomen/pelvis which was ordered yesterday afternoon, still not performed. May need to reinsert NG tube for decompression. Await further recs per GI. Will increase OxyContin to 20 mg p.o. twice daily.  9/19: Patient continues with abdominal discomfort, distention and multiple emesis over the past 24 hours.  Repeat CT yesterday continues to demonstrate gastric outlet obstruction.  Discussed with GI, Dr. Silverio Decamp; and no other recommendations at this time, awaiting further recommendations of Dr. Mansouraty/Cirigliano will return to service tomorrow, and ultimately may need surgical intervention.  Will reinsert NG tube in place to low wall suction, return to n.p.o. status.  9/20: NG tube placed overnight with dark/coffee-ground material.  Abdominal and low back pain improved following decompression of gastric contents.  Unfortunately continues with gastric outlet obstruction and high output from biliary drain.  Seen by GI and hepatobiliary surgery this morning with recommendations of IR basement of PTC and nasojejunal feeding tube.  GI plans EGD tomorrow due to coffee-ground emesis.  9/21: EGD with grade C/D esophagitis with active oozing stigmata of recent bleeding, gastritis with hemorrhage, congested duodenal mucosa unable to visualize distal biliary stent even with fluoroscopic guidance.  Pending IR with cholangiogram and possible PTC placement   Assessment & Plan:   Principal Problem:   Post-ERCP acute pancreatitis Active Problems:   Severe sepsis (HCC)   Polycythemia   AKI (acute kidney injury) ([place])   Metabolic acidosis   Acute respiratory failure with hypoxia (HCC)   Bilateral pleural effusion   Ileus (HCC)   Bile duct leak   Gastric outlet obstruction   Abdominal distention   ARDS (adult respiratory distress  syndrome) ([place])   Biloma following surgery   Acute blood loss anemia   Acute post ERCP pancreatitis/ileus Bile leak following cholecystectomy, persistent Patient presenting following increased abdominal pain following ERCP.  Lipase 1379.  CT abdomen/pelvis notable for pancreatitis.  Previous cholecystectomy with bile leak that has persisted despite bile duct stenting.  Gallbladder fossa drain in place placed by IR on 02/13/2020. Repeat CT A//P w/ contrast 9/18 with moderate peripancreatic fluid unchanged, organized fluid likely representing pseudocyst less likely abscess, pigtail drainage catheter in gallbladder fossa, common bile duct stent unchanged, moderate ascites. NG tube pulled out on 05/22/2020, diet advanced to full liquids by GI on 9/15, and not tolerating with return of emesis; replaced NGT to [facility] 9/20. --[place] [facility]/IR/Gen Surgery following, appreciate assistance --Continue TPN with pharmacy monitoring/dosing --Continue to monitor gallbladder fossa drain output, 27m past 24 hours --Hepatobiliary surgery recommending IR placement of PTC, for IR today  Coffee-ground emesis EGD with grade C/D esophagitis with active oozing stigmata of recent bleeding, gastritis with hemorrhage.  --Protonix 40 mg IV every 12 hours --N.p.o. and continue TPN  Acute hypoxic respiratory failure: Resolved Bilateral pleural effusions On 04/28/2020, patient developed severe respiratory distress, rapid response patient was transferred to the ICU with mechanical ventilation.  Etiology volume overload with aspiration pneumonia.  Was successfully extubated on 05/01/2020.  Completed course of antibiotics with vancomycin, Zosyn and meropenem.  Supplemental oxygen has been titrated off and oxygenating well on room air.  CXR 9/16 with stable patchy bilateral airspace disease, left greater than right with suspicion of small left pleural effusion. --Strict I's and O's and daily weights --Supportive care  Acute renal  failure: Resolved Patient presenting with creatinine 1.41.  Etiology likely poor oral intake coupled with acute pancreatitis as above.  Patient  was treated with IV fluid hydration, now supported with TPN with further advancement of diet.  Now resolved with creatinine 0.92.  Hypokalemia Hypomagnesemia Repleted, currently on TPN.  Potassium 3.7 today --Continue to follow electrolytes closely daily  Paroxysmal atrial fibrillation, transient: Resolved Spontaneously converted to normal sinus rhythm, will CHA2DS2-VASc score = 1, low risk of thrombosis.  Telemetry has been discontinued.  Chronic pain syndrome: On oxycodone 10 mg every 4 hours as needed for pain at home.  On x-ray 05/25/2020 with mild lumbar degenerative change, no acute abnormality. --Tylenol 650 mg every 6 hours scheduled --Increased OxyContin to 20 mg p.o. q12h on 9/18 --Lidocaine patch --OxyIR, 5 mg q4h prn for moderate breakthrough pain --Dilaudid 1 mg IV q3h prn severe breakthrough pain  Anemia of chronic disease Etiology likely secondary to malnutrition.  Hemoglobin 10.3, stable. --Continue to monitor hemoglobin, transfuse for hemoglobin less than 7.0.  Essential hypertension On lisinopril and HCTZ at home.  --Continue to hold home antihypertensives for now  Dyslipidemia: Continue pravastatin  Hyperglycemia Hemoglobin A1c 5.6. --Continue insulin therapy per TPN protocol and moderate sliding scale --CBGs Q4h  Moderate calorie protein malnutrition Nutrition Status: Nutrition Problem: Inadequate oral intake Etiology: acute illness Signs/Symptoms: NPO status Interventions: TPN, Ensure Enlive (each supplement provides 350kcal and 20 grams of protein) --Continue TPN, pharmacy consulted for assistance dosing/monitoring --Resume NPO 9/19  Depression/anxiety: Continue sertraline and trazodone   DVT prophylaxis: Lovenox Code Status: Full code Family Communication: Updated patient extensively at  bedside  Disposition Plan:  Status is: Inpatient  Remains inpatient appropriate because:Ongoing active pain requiring inpatient pain management, Ongoing diagnostic testing needed not appropriate for outpatient work up, Unsafe d/c plan, IV treatments appropriate due to intensity of illness or inability to take PO and Inpatient level of care appropriate due to severity of illness continues on clear liquid diet, remains on TPN   Dispo:  Patient From: [facility]  Planned Disposition: Home  Expected discharge date: 06/01/20  Medically stable for discharge: NoNo   Consultants:   [facility]  Interventional radiology  General surgery - signed off 05/18/2020  Procedures:   ERCP 9/9  Antimicrobials:   Zosyn 8/19-8/26  Meropenem 8/27 - 9/11  Vancomycin 8/22 - 8/23   Subjective: Patient seen and examined bedside, appears more comfortable today.  Awaiting EGD this morning followed by IR with consideration of PTC placement per recommendations of hepatobiliary surgery.  No family present today.  No other complaints or concerns at this time. Denies headache, no visual changes, no palpitations, no diarrhea, no fever/chills/night sweats.  No other acute events overnight per nursing staff.  Objective: Vitals:   05/29/20 1150 05/29/20 1200 05/29/20 1210 05/29/20 1242  BP: 134/79 133/73 123/66 122/81  Pulse: (!) 120 (!) 117 (!) 120 99  Resp: 15 20 17 16   Temp:    99.5 F (37.5 C)  TempSrc:    Oral  SpO2: 95% 96% 96% 92%  Weight:      Height:        Intake/Output Summary (Last 24 hours) at 05/29/2020 1416 Last data filed at 05/29/2020 1324 Gross per 24 hour  Intake 2533.11 ml  Output 6200 ml  Net -3666.89 ml   Filed Weights   05/24/20 0500 05/26/20 0500 05/28/20 0500  Weight: 86.3 kg 84.6 kg 83.5 kg    Examination:  General exam: Uncomfortable in appearance, anxious, chronically ill appearance, NG tube noted in place to low wall suction Respiratory system: Clear to  auscultation. Respiratory effort normal.  Oxygenating well on  room air Cardiovascular system: S1 & S2 heard, RRR. No JVD, murmurs, rubs, gallops or clicks.  1+ pedal edema bilateral lower extremities to mid shin Gastrointestinal system: Abdomen is distended, with mild global tenderness. No organomegaly or masses felt. Bowel sounds absent Central nervous system: Alert and oriented. No focal neurological deficits. Extremities: Symmetric 5 x 5 power. Skin: No rashes, lesions or ulcers Psychiatry: Judgement and insight appear normal.  Depressed mood & flat affect.    Data Reviewed: I have personally reviewed following labs and imaging studies  CBC: Recent Labs  Lab 05/23/20 0455 05/23/20 0455 05/25/20 0359 05/26/20 1630 05/27/20 0355 05/28/20 0350 05/29/20 0841  WBC 23.1*   < > 23.6* 29.6* 25.5* 23.3* 23.6*  NEUTROABS 18.4*  --   --   --   --  19.0*  --   HGB 10.3*   < > 10.3* 10.4* 9.6* 8.8* 8.6*  HCT 33.1*   < > 33.1* 34.4* 31.1* 28.6* 29.5*  MCV 87.6   < > 88.5 90.5 90.4 90.2 91.9  PLT 235   < > 263 320 332 321 304   < > = values in this interval not displayed.   Basic Metabolic Panel: Recent Labs  Lab 05/24/20 0405 05/24/20 0405 05/25/20 0359 05/26/20 0349 05/27/20 0355 05/28/20 0350 05/29/20 0404  NA 135   < > 136 141 142 142 146*  K 3.9   < > 4.3 4.1 4.2 3.8 3.7  CL 104   < > 103 104 102 103 109  CO2 24   < > 24 28 31 28 27   GLUCOSE 209*   < > 186* 320* 344* 297* 203*  BUN 20   < > 20 28* 37* 41* 39*  CREATININE 0.66   < > 0.58* 0.61 0.75 0.82 0.92  CALCIUM 8.6*   < > 8.6* 8.3* 8.4* 8.2* 8.3*  MG 2.0  --  2.1 2.1 2.1 2.1  --   PHOS 3.7  --  3.9 3.6 3.9 3.2  --    < > = values in this interval not displayed.   GFR: Estimated Creatinine Clearance: 81.1 mL/min (by C-G formula based on SCr of 0.92 mg/dL). Liver Function Tests: Recent Labs  Lab 05/24/20 0405 05/28/20 0350  AST 20 28  ALT 17 29  ALKPHOS 220* 303*  BILITOT 0.7 0.9  PROT 5.7* 5.7*  ALBUMIN  1.6* 1.4*   No results for input(s): LIPASE, AMYLASE in the last 168 hours. No results for input(s): AMMONIA in the last 168 hours. Coagulation Profile: Recent Labs  Lab 05/29/20 0404  INR 1.4*   Cardiac Enzymes: No results for input(s): CKTOTAL, CKMB, CKMBINDEX, TROPONINI in the last 168 hours. BNP (last 3 results) No results for input(s): PROBNP in the last 8760 hours. HbA1C: No results for input(s): HGBA1C in the last 72 hours. CBG: Recent Labs  Lab 05/28/20 2032 05/29/20 0010 05/29/20 0620 05/29/20 0812 05/29/20 1238  GLUCAP 175* 165* 180* 166* 228*   Lipid Profile: Recent Labs    05/28/20 0350  TRIG 162*   Thyroid Function Tests: No results for input(s): TSH, T4TOTAL, FREET4, T3FREE, THYROIDAB in the last 72 hours. Anemia Panel: No results for input(s): VITAMINB12, FOLATE, FERRITIN, TIBC, IRON, RETICCTPCT in the last 72 hours. Sepsis Labs: No results for input(s): PROCALCITON, LATICACIDVEN in the last 168 hours.  No results found for this or any previous visit (from the past 240 hour(s)).       Radiology Studies: DG Abd Portable 1V  Result  Date: 05/27/2020 CLINICAL DATA:  Nasogastric tube positioning. EXAM: PORTABLE ABDOMEN - 1 VIEW COMPARISON:  May 23, 2020 FINDINGS: Since the prior study there is been interval placement of a nasogastric tube, with its distal tip overlying the expected region of the body of the stomach. The bowel gas pattern is normal. Stable right-sided percutaneous pigtail catheter positioning is seen. A radiopaque stent is again seen overlying the right upper quadrant. No radio-opaque calculi or other significant radiographic abnormality are seen. IMPRESSION: Nasogastric tube positioning, as described above. Electronically Signed   By: Virgina Norfolk M.D.   On: 05/27/2020 22:04   DG C-Arm 1-60 Min-No Report  Result Date: 05/29/2020 Fluoroscopy was utilized by the requesting physician.  No radiographic interpretation.       Scheduled Meds: . acetaminophen  650 mg Oral Q6H  . acidophilus  2 capsule Oral Daily  . Chlorhexidine Gluconate Cloth  6 each Topical Daily  . enoxaparin (LOVENOX) injection  40 mg Subcutaneous Q24H  . influenza vac split quadrivalent PF  0.5 mL Intramuscular Tomorrow-1000  . insulin aspart  0-15 Units Subcutaneous 4 times per day  . mouth rinse  15 mL Mouth Rinse BID  . oxyCODONE  20 mg Oral Q12H  . pantoprazole (PROTONIX) IV  40 mg Intravenous Q12H  . polyethylene glycol  17 g Oral Daily  . pravastatin  20 mg Oral q1800  . sertraline  100 mg Oral Daily  . sodium chloride flush  10-40 mL Intracatheter Q12H  . traZODone  50 mg Oral QHS   Continuous Infusions: . sodium chloride Stopped (05/07/20 0923)  . piperacillin-tazobactam (ZOSYN)  IV    . TPN CYCLIC-ADULT (ION)       LOS: 32 days    Time spent: 38 minutes spent on chart review, discussion with nursing staff, consultants, updating family and interview/physical exam; more than 50% of that time was spent in counseling and/or coordination of care.    Ananth Fiallos J [place] DO [facility] Available via Epic secure chat 7am-7pm After these hours, please refer to coverage provider listed on [email or web address] 05/29/2020, 2:16 PM

## 2020-05-29 NOTE — Plan of Care (Signed)
  Problem: Activity: Goal: Risk for activity intolerance will decrease Outcome: Progressing   Problem: Nutrition: Goal: Adequate nutrition will be maintained Outcome: Progressing   Problem: Elimination: Goal: Will not experience complications related to bowel motility Outcome: Progressing   

## 2020-05-29 NOTE — Progress Notes (Signed)
Subjective: No acute changes. Afebrile. Recorded drain output is 347m, however nothing charted during day shift yesterday. NG output remains very high. Patient is to undergo EGD today as well as IR PTC.  Objective: Vital signs in last 24 hours: Temp:  [97.4 F (36.3 C)-98.9 F (37.2 C)] 97.7 F (36.5 C) (09/21 0924) Pulse Rate:  [68-116] 116 (09/21 0924) Resp:  [16-18] 16 (09/21 0924) BP: (116-153)/(73-93) 153/73 (09/21 0924) SpO2:  [92 %-98 %] 95 % (09/21 0924) Last BM Date: 05/28/20  Intake/Output from previous day: 09/20 0701 - 09/21 0700 In: 2233.1 [P.O.:600; I.V.:1633.1] Out: 5750 [Urine:1600; Emesis/NG output:3950; Drains:200] Intake/Output this shift: No intake/output data recorded.  PE: General: resting comfortably, NAD Neuro: alert and oriented HEENT: NG tube in place Abdomen: distended, mildly tender to palpation, no rebound tenderness or guarding. RUQ percutaneous drain with bilious fluid in bag.  Lab Results:  Recent Labs    05/28/20 0350 05/29/20 0841  WBC 23.3* 23.6*  HGB 8.8* 8.6*  HCT 28.6* 29.5*  PLT 321 304   BMET Recent Labs    05/28/20 0350 05/29/20 0404  NA 142 146*  K 3.8 3.7  CL 103 109  CO2 28 27  GLUCOSE 297* 203*  BUN 41* 39*  CREATININE 0.82 0.92  CALCIUM 8.2* 8.3*   PT/INR Recent Labs    05/29/20 0404  LABPROT 16.4*  INR 1.4*   CMP     Component Value Date/Time   NA 146 (H) 05/29/2020 0404   K 3.7 05/29/2020 0404   CL 109 05/29/2020 0404   CO2 27 05/29/2020 0404   GLUCOSE 203 (H) 05/29/2020 0404   BUN 39 (H) 05/29/2020 0404   CREATININE 0.92 05/29/2020 0404   CALCIUM 8.3 (L) 05/29/2020 0404   PROT 5.7 (L) 05/28/2020 0350   ALBUMIN 1.4 (L) 05/28/2020 0350   AST 28 05/28/2020 0350   ALT 29 05/28/2020 0350   ALKPHOS 303 (H) 05/28/2020 0350   BILITOT 0.9 05/28/2020 0350   GFRNONAA >60 05/29/2020 0404   GFRAA >60 05/29/2020 0404   Lipase     Component Value Date/Time   LIPASE 27 05/16/2020 1905        Studies/Results: DG Abd Portable 1V  Result Date: 05/27/2020 CLINICAL DATA:  Nasogastric tube positioning. EXAM: PORTABLE ABDOMEN - 1 VIEW COMPARISON:  May 23, 2020 FINDINGS: Since the prior study there is been interval placement of a nasogastric tube, with its distal tip overlying the expected region of the body of the stomach. The bowel gas pattern is normal. Stable right-sided percutaneous pigtail catheter positioning is seen. A radiopaque stent is again seen overlying the right upper quadrant. No radio-opaque calculi or other significant radiographic abnormality are seen. IMPRESSION: Nasogastric tube positioning, as described above. Electronically Signed   By: TVirgina NorfolkM.D.   On: 05/27/2020 22:04    Anti-infectives: Anti-infectives (From admission, onward)   Start     Dose/Rate Route Frequency Ordered Stop   05/29/20 0800  [MAR Hold]  piperacillin-tazobactam (ZOSYN) IVPB 3.375 g        (MAR Hold since Tue 05/29/2020 at 1006.Hold Reason: Transfer to a Procedural area.)   3.375 g 12.5 mL/hr over 240 Minutes Intravenous To Radiology 05/28/20 1328 05/30/20 0800   05/04/20 1230  meropenem (MERREM) 1 g in sodium chloride 0.9 % 100 mL IVPB  Status:  Discontinued        1 g 200 mL/hr over 30 Minutes Intravenous Every 8 hours 05/04/20 1156 05/19/20 0826   05/01/20  1400  piperacillin-tazobactam (ZOSYN) IVPB 3.375 g        3.375 g 12.5 mL/hr over 240 Minutes Intravenous Every 8 hours 05/01/20 1013 05/03/20 0907   04/29/20 2000  vancomycin (VANCOCIN) IVPB 1000 mg/200 mL premix  Status:  Discontinued        1,000 mg 200 mL/hr over 60 Minutes Intravenous Every 12 hours 04/29/20 0653 04/30/20 0831   04/29/20 0745  vancomycin (VANCOREADY) IVPB 1500 mg/300 mL        1,500 mg 150 mL/hr over 120 Minutes Intravenous  Once 04/29/20 0653 04/29/20 1129   04/26/20 2300  piperacillin-tazobactam (ZOSYN) IVPB 3.375 g        3.375 g 12.5 mL/hr over 240 Minutes Intravenous Every 8 hours  04/26/20 1716 05/01/20 0559   04/26/20 1700  piperacillin-tazobactam (ZOSYN) IVPB 3.375 g        3.375 g 100 mL/hr over 30 Minutes Intravenous  Once 04/26/20 1655 04/26/20 1844       Assessment/Plan 64 yo male s/p subtotal cholecystectomy, complicated by persistent bile leak, 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 - IR PTC for biliary drainage. Patient is also to undergo EGD today to evaluate anemia. - Remain NPO with NG decompression in setting of gastric outlet obstruction - Continue TPN - Need accurate recording of drain output. Drain bag was full yesterday morning but no output was charted. Output is clearly much higher than what is being recorded. - Surgery will continue to follow   LOS: 32 days    Michaelle Birks, MD [facility] General, Hepatobiliary and Pancreatic Surgery 05/29/20 11:04 AM

## 2020-05-29 NOTE — Progress Notes (Signed)
RN attempted to insert NGT but Pt refused at this time. Pt agreeable to inserting NGT at a later time today.

## 2020-05-29 NOTE — Progress Notes (Signed)
Transport arrived to pick up patient after procedure in IR. Pt tried to adjust his mask out of his eyes and accidentally removed his NG tube. Rn on floor informed. PT is stable and tolerating ice chips at this time.

## 2020-05-29 NOTE — Progress Notes (Signed)
PHARMACY - TOTAL PARENTERAL NUTRITION CONSULT NOTE  Indication:  Severe pancreatitis, biliary fistula  Patient Measurements: Height: 5' 9"  (175.3 cm) Weight: 83.5 kg (184 lb 1.4 oz) IBW/kg (Calculated) : 70.7 TPN AdjBW (KG): 86.1 Body mass index is 27.18 kg/m.  Assessment:  64 YOM admitted 04/26/20, post-ERCP 8/18 and biliary stent exchange for persistent postoperative bile leak related to recent cholecystectomy with abscess s/p percutaneous drainage. Patient developed acute pancreatitis, has had a prolonged hospitalization complicated by VDRF (extubated 8/24), AKI, encephalopathy, and afib. TPN initiated 9/11 for acute post-ERCP pancreatitis/ileus and persistent biliary leak. Significant gastric fluid retention from functional outlet obstruction. Pt still with very poor PO intake.  9/15 - Advanced to FLD 9/17 - Pt reports minimal PO intake exacerbates severe back pain, refusing PO supplements 9/18 - Large coffee-ground emesis overnight, given phenergan 9/19 - Reinserted NGT to LWS d/t intolerance and N/V, GOO on repeat CT >> NPO status  9/20 - Reported N/V yesterday, none today. Having back/abdominal pain  Glucose / Insulin: A1c 5.6% - CBGs improved, 165-203. 21 units mSSI utilized in last 24 hrs, 25 units regular insulin in TPN.  Electrolytes: Na 146 high. K 3.7- with KCl x3 (ileus goal >/= 4), corr Ca high-normal ~10.3; others WNL Renal: SCr 0.92 (up), BUN 39 (down).  LFTs / TGs: LFTs/Tbili WNL. Alk phos 303 (up), TG 162 Prealbumin / albumin: Prealbumin 8.2 (up). Albumin 1.4 (down) Intake / Output; MIVF: UOP 0.8m/kg/hr. LBM 9/17 (significant stool output noted per GI 9/17 after no significant stool for 2 weeks). Drain 2026m24hrs. NGT 395074m4hrs, I/O -2854 since 9/7  GI Imaging:  9/5: CT - severe pancreatitis 9/7: HIDA - persistent bile leak with accumulations/paracolic gutter 9/9: CT - pancreatitis (peripancreatic fat stranding and peripancreatic fluid, percutaneous drain at the  gallbladder fossa, unchanged biliary stent with no intrahepatic biliary duct dilatation, bilateral pleural effusions 9/15: KUB - diffuse colonic stool, no bowel obstruction/free air 9/17: X-ray lumbar spine - no acute abnormality 9/18: CT abd/pelvis - mild colonic diverticulosis, peripancreatic fluid indicative of pseudocyst involvement and less likely abscess, otherwise unchanged Surgeries / Procedures: 8/18: ERCP/biliary stent exchange 9/9: Unsuccessful ERCP 9/10: Paracentesis - 1.4L out 9/19: NGT reinserted  9/21: pending percutaneous biliary drainage and EGD  Central access: PICC placed 05/19/20 TPN start date: 05/19/20  Nutritional Goals (per RD rec on 05/18/20): kCal: 2300-2500, Protein: 130-145g, Fluid: >2.3L/day  Current Nutrition: TPN  Plan:  Continue 18 hour cyclic TPN at 1800233fuse 2398 ml over 18 hrs:  71 ml/hr x 1, then 141 ml/hr x 16 hrs, then 71 ml/hr x 1 hr TPN will provide 144g AA, 312g CHO and 72g ILE for a total of 2355 kCal, meeting 100% of needs  Electrolytes in TPN: Decrease Na to 45m56m, Increase K to 40 mEq/L, and continue Mag 6mEq29m Phos 4mmol37mand Ca 0mEq/L31ml:Ac 1:1.  Add standard MVI and trace elements to TPN  Continue mSSI 4x/d + increase to 30 units insulin in TPN  KCl x 3 runs F/u AM labs, GI / IR plans, CBGs and tolerance to 18-hr c43-HW TPN, I/O's and need for MIVF  SSelmaacy Student 05/29/2020 7:05 AM

## 2020-05-29 NOTE — Progress Notes (Signed)
OT Cancellation Note  Patient Details Name: Adam Fowler MRN: [id number] DOB: June 05, 1956   Cancelled Treatment:    Reason Eval/Treat Not Completed: Patient at procedure or test/ unavailable (Pt undergoing EGD this AM. Will check back as time allows.)  Layla Maw 05/29/2020, 9:34 AM

## 2020-05-29 NOTE — Procedures (Signed)
Interventional Radiology Procedure Note  Procedure:  1) Ultrasound guided paracentesis 2) Biloma drain check 3) Percutaneous cholangiogram 4) Placement of biliary drain via right bile ducts  Findings: Please refer to procedural dictation for full description.  Moderate volume ascites, ultrasound guided paracentesis via right lower quadrant yielding 3 L clear, straw-colored fluid.  Indwelling biloma drain in good position, with patent cystic duct, and occlusion of distal aspect of indwelling biliary stent.  Right biliary 10.2 Fr biliary drain placed, with 10 cm of extra side holes cut into cathter.  Pigtail tip of drain is within 3rd/4th portion of duodenum.    Complications: None immediate  Estimated Blood Loss: <10 mL  Recommendations: -keep both drains to bag drainage overnight -if there is bilious (non-bloody) output in the morning, recommend capping biloma drain (more inferior/anterior) and keeping biliary drain (more superior/posterior) to bag for a full 24 hrs and if tolerated then capping to internalize -recommend gentle forward flush of biliary drain with 5 mL saline every 8 hours -IR will continue to follow   Ruthann Cancer, MD

## 2020-05-29 NOTE — Transfer of Care (Signed)
Immediate Anesthesia Transfer of Care Note  Patient: Adam Fowler  Procedure(s) Performed: ESOPHAGOGASTRODUODENOSCOPY (EGD) WITH PROPOFOL (N/A ) FLUOROSCOPY GUIDANCE (N/A )  Patient Location: Endoscopy Unit  Anesthesia Type:General  Level of Consciousness: oriented, drowsy and patient cooperative  Airway & Oxygen Therapy: Patient Spontanous Breathing and Patient connected to face mask oxygen  Post-op Assessment: Report given to RN and Post -op Vital signs reviewed and stable  Post vital signs: Reviewed  Last Vitals:  Vitals Value Taken Time  BP    Temp    Pulse 121 05/29/20 1129  Resp 26 05/29/20 1129  SpO2 94 % 05/29/20 1129  Vitals shown include unvalidated device data.  Last Pain:  Vitals:   05/29/20 0924  TempSrc: Temporal  PainSc: 4       Patients Stated Pain Goal: 3 (18/86/77 3736)  Complications: No complications documented.

## 2020-05-29 NOTE — Anesthesia Preprocedure Evaluation (Addendum)
Anesthesia Evaluation  Patient identified by MRN, date of birth, ID band Patient awake    Reviewed: Allergy & Precautions, NPO status , Patient's Chart, lab work & pertinent test results  History of Anesthesia Complications Negative for: history of anesthetic complications  Airway Mallampati: II  TM Distance: >3 FB Neck ROM: Full    Dental no notable dental hx. (+) Dental Advisory Given   Pulmonary neg pulmonary ROS,    Pulmonary exam normal        Cardiovascular hypertension, Pt. on medications  Rhythm:Regular Rate:Tachycardia     Neuro/Psych negative neurological ROS  negative psych ROS   GI/Hepatic Neg liver ROS,  Bile leak Gastric outlet obstruction pancreatitis    Endo/Other  negative endocrine ROS  Renal/GU negative Renal ROS     Musculoskeletal negative musculoskeletal ROS (+)   Abdominal Normal abdominal exam  (+)   Peds  Hematology  (+) anemia ,  INR 1.3    Anesthesia Other Findings NGT in place  Reproductive/Obstetrics                            Anesthesia Physical  Anesthesia Plan  ASA: III  Anesthesia Plan: General   Post-op Pain Management:    Induction: Intravenous and Rapid sequence  PONV Risk Score and Plan: 2 and Treatment may vary due to age or medical condition, Ondansetron and Dexamethasone  Airway Management Planned: Oral ETT  Additional Equipment: None  Intra-op Plan:   Post-operative Plan: Extubation in OR  Informed Consent: I have reviewed the patients History and Physical, chart, labs and discussed the procedure including the risks, benefits and alternatives for the proposed anesthesia with the patient or authorized representative who has indicated his/her understanding and acceptance.     Dental advisory given  Plan Discussed with: CRNA and Anesthesiologist  Anesthesia Plan Comments: (GETA. RSI. 1 PIV)        Anesthesia Quick  Evaluation

## 2020-05-29 NOTE — Op Note (Signed)
[facility] Patient Name: Adam Fowler Procedure Date : 05/29/2020 MRN: [id number] Attending MD: Justice Britain , MD Date of Birth: Jan 06, 1956 CSN: [id number] Age: 64 Admit Type: Inpatient Procedure:                Upper GI endoscopy Indications:              Heartburn, Gastro-esophageal reflux disease,                            Coffee-ground emesis, Hematemesis, Suspected upper                            gastrointestinal bleeding Providers:                Justice Britain, MD, Burtis Junes, RN, Cletis Athens,                            Technician, Luciana Axe, CRNA Referring MD:              Medicines:                General Anesthesia Complications:            No immediate complications. Estimated Blood Loss:     Estimated blood loss was minimal. Procedure:                Pre-Anesthesia Assessment:                           - Prior to the procedure, a History and Physical                            was performed, and patient medications and                            allergies were reviewed. The patient's tolerance of                            previous anesthesia was also reviewed. The risks                            and benefits of the procedure and the sedation                            options and risks were discussed with the patient.                            All questions were answered, and informed consent                            was obtained. Prior Anticoagulants: The patient has                            taken no previous anticoagulant or antiplatelet                            agents. ASA Grade Assessment:  III - A patient with                            severe systemic disease. After reviewing the risks                            and benefits, the patient was deemed in                            satisfactory condition to undergo the procedure.                           After obtaining informed consent, the endoscope was                             passed under direct vision. Throughout the                            procedure, the patient's blood pressure, pulse, and                            oxygen saturations were monitored continuously. The                            [id number]-[id number] ([id number]) Olympus gastroscope was                            introduced through the mouth, and advanced to the                            third part of duodenum. The [id number]-[id number] ([id number])                            Olympus Duodensocope was introduced through the                            mouth, and advanced to the duodenal bulb. The upper                            GI endoscopy was accomplished without difficulty.                            The patient tolerated the procedure. Scope In: Scope Out: Findings:      No gross lesions were noted in the proximal esophagus.      LA Grade C/Grade D esophagitis and ulcerations with evidence of some       mild oozing and stigmata of recent bleeding were found in the middle and       distal esophagus. High risk stigmata were not noted that required       endoscopic therapy currently.      Patchy moderate inflammation with hemorrhage characterized by adherent       blood, congestion (edema), erythema, friability, granularity and linear       erosions were found in the cardia, in the gastric fundus, in the gastric  body and in the gastric antrum. There is suggestion of ulcerations from       NGT being place. High risk stigmata were not noted.      There was suspicion of some mild extrinsic impression in the posterior       aspect of the body of the stomach - query peripancreatic fluid       collection abutment.      No gross lesions were noted in the duodenal bulb.      Diffuse severely congested mucosa without active bleeding and with no       stigmata of bleeding was found in the D1/D2 angle and in the second       portion of the duodenum and in the area of the papilla. The adult       endoscope with gentle  pressure was able to traverse this region. I       attempted to place the duodenoscope through this area but I could not. I       then used fluoroscopic guidance in effort of trying to find the biliary       stent since I could not completely visualize this with the adult       endoscope or duodenoscope. Even with fluoroscopic guidance, I could not       see the distal aspect of the biliary stent nor the sphincterotomy site.      Normal appearing duodenal mucosa noted in third portion of the Duodenum.      After completion of the EGD, and since the patient had between 2L and 4L       of NGT output, decision made to replace the NGT, even though it was felt       that if he could have a holiday it would be better, but not clear he       could tolerate. A 16 Fr nasogastric tube was placed through the left       nare into the esophagus. Under endoscopic guidance, the tube was       advanced into the stomach. Placement was confirmed by fluoroscopy and       scope visualization. There was some nosebleeding that occured but this       slowed with gentle pressure of the nares.      The endoscope was removed from the patient. Impression:               - No gross lesions in esophagus proximally.                           - LA Grade C/Grade D esophagitis with active oozing                            and stigmata of recent bleeding noted. Stasis                            esophagitis + NGT likely etiology.                           - Gastritis with hemorrhage. NGT trauma and                            gastritis present.                           -  No gross lesions in the duodenal bulb.                           - Congested duodenal mucosa in D1/D2 sweep and D2.                            Traversed only with adult endoscope not                            duodenoscope.                           - D3 normal mucosa.                           - Unable to visualize distal aspect of biliary                             stent. Fluroscopic guidance performed but still                            unable to visualize endoscopically the biliary                            stent.                           - Feeding tube placement was successfully performed                            to the left nare - some epistaxis noted but                            improved with pressure to nares.                           - Etiology of recent hematemesis most likely                            esophagitis and gastritis. Ideally would have a NGT                            holiday, but not clear that it is possible                            currently. Will need continued IV PPI. Recommendation:           - The patient will be observed post-procedure,                            until all discharge criteria are met.                           - Return patient to hospital ward for ongoing care.                           -  NPO.                           - Continue present medications.                           - NGT to LIWS periodically rather than constant                            would be reasonable.                           - Agree with next steps in evaluation with PBD                            attempt to see if we can divert the biliary flow                            outside of the Drain positioning in effort of                            optimizing healing of the region.                           - In some fortunate circumstance, the region of                            congestion/swelling seems to be improving to the                            point that with pressure, an adult endoscope can                            traverse the previous stenosis/narrowing in the                            D1/D2 and D2 regions. Unfortunately, cannot allow                            safe passage of the duodenoscope to re-evaluate the                            stent positioning. I could not visualize the distal                             aspect of the biliary fully covered stent. Query                            from continued congestion in the area vs the                            possibility that it has migrated into the biliary  duct somewhat (hard to believe based on the size of                            the ampullary orifice previously).                           - If PBD placement is not successful, could try to                            consider EUS with cystgastrostomy creation in                            effort of trying to decrease the                            swelling/congestion surrounding the pancreas to see                            if that were to aid in passage and evaluation of                            the biliary stent. However, it is not clear to me                            that all of the peripancreatic fluid collection is                            completely mature for placement, but an EUS would                            be required if necessary.                           - Hopefully will be able to tolerate some clear                            liquids in the next few days will continuing TPN.                           - May want to consider potential role of stoppage                            of antibiotics for a few days to see if any                            significant issues occur such that we can temporize                            the risk of development of antibiotic resistant                            bugs. At present we have no evidence of aseptic  necrosis and he has been on antibiotics for >3                            weeks. Will defer this discussion to primary GI                            team with Inpatient team and Surgery team as well                            (unless antibiotics indicated from a PBD                            perspective).                           - The findings and  recommendations were Procedure Code(s):        --- Professional ---                           [phone], Esophagogastroduodenoscopy, flexible,                            transoral; with insertion of intraluminal tube or                            catheter Diagnosis Code(s):        --- Professional ---                           K20.91, Esophagitis, unspecified with bleeding                           K29.71, Gastritis, unspecified, with bleeding                           K31.89, Other diseases of stomach and duodenum                           R12, Heartburn                           K21.9, Gastro-esophageal reflux disease without                            esophagitis                           K92.0, Hematemesis CPT copyright 2019 [employer]. All rights reserved. The codes documented in this report are preliminary and upon coder review may  be revised to meet current compliance requirements. Justice Britain, MD 05/29/2020 11:35:00 AM Number of Addenda: 0

## 2020-05-29 NOTE — Progress Notes (Signed)
Nutrition Follow-up  RD working remotely.  DOCUMENTATION CODES:   Not applicable  INTERVENTION:   -TPN management per pharmacy -RD will follow for diet advancement and adjust supplement regimen as appropriate  NUTRITION DIAGNOSIS:   Inadequate oral intake related to acute illness as evidenced by NPO status.  Ongoing  GOAL:   Patient will meet greater than or equal to 90% of their needs  Progressing   MONITOR:   PO intake, Supplement acceptance, Diet advancement, Labs, Weight trends, Skin, I & O's  REASON FOR ASSESSMENT:   Ventilator    ASSESSMENT:   64 year old male with PMH of HTN, ERCP on 8/18 s/p biliary stent exchange for persistent postoperative bile leak after recent cholecystectomy with abscess s/- percutaneous drainage. CT abdomen/pelvis showing acute pancreatitis. Pt admitted with severe sepsis secondary to post-ERCP pancreatitis.  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; NGT placed for decompression 9/10- s/p paracentesis- 1.4 L fluid removed 9/11- PICC placed, TPN initiated 9/14- NGT removed, advanced to clear liquid diet 9/15- KUB revealed diffuse colonic stool, no FA, no obstruction; advanced to full liquid diet 9/16- transitioned to cyclic TPN 0/17- NGT reinserted for decompression 9/21- s/p upper GI endoscopy- revealed LA grade C/ grade D esophagitis with  active oozing and stigmata of recent bleeding (stasis esophagitis and NGT likely etiology), gastritis with hemorrhage, congested duodenal mucosa in D1/D2   Reviewed I/O's: -3.5 L x 24 hours and -2.9 L since 05/15/20  UOP: 1.6 L x 24 hours  NGT output: 4 L x 24 hours  Drain output: 200 ml x 24 hours  Per IR notes, plan for percutaneous biliary drainage procedure today.   Pt NPO with NGT to intermittent suction. He remains on TPN for nutritional support (18 hour cyclic TPN), which provides 2355 kcals and 144 grams protein, meeting 100% of estimated kcal and protein needs.  Wt has been stable since admission.   Medications reviewed and include miralax  Labs reviewed: CBGS: 166 (inpatient orders for glycemic control are 0-15 units insulin aspart 4 times daily).   Diet Order:   Diet Order            Diet NPO time specified Except for: Sips with Meds  Diet effective midnight                 EDUCATION NEEDS:   Education needs have been addressed  Skin:  Skin Assessment: Skin Integrity Issues: Skin Integrity Issues:: Incisions Incisions: closed abdomen  Last BM:  05/28/20  Height:   Ht Readings from Last 1 Encounters:  05/02/20 5\' 9"  (1.753 m)    Weight:   Wt Readings from Last 1 Encounters:  05/28/20 83.5 kg    Ideal Body Weight:  72.7 kg  BMI:  Body mass index is 27.18 kg/m.  Estimated Nutritional Needs:   Kcal:  2300-2500  Protein:  130-145 grams  Fluid:  > 2.3 L    Loistine Chance, RD, LDN, [place] Registered Dietitian II Certified Diabetes Care  and Education Specialist Please refer to [facility] for RD and/or RD on-call/weekend/after hours pager

## 2020-05-29 NOTE — Anesthesia Procedure Notes (Signed)
Procedure Name: Intubation Date/Time: 05/29/2020 10:13 AM Performed by: Jenne Campus, CRNA Pre-anesthesia Checklist: Patient identified, Emergency Drugs available, Suction available and Patient being monitored Patient Re-evaluated:Patient Re-evaluated prior to induction Oxygen Delivery Method: Circle System Utilized Preoxygenation: Pre-oxygenation with 100% oxygen Induction Type: IV induction, Rapid sequence and Cricoid Pressure applied Laryngoscope Size: Miller and 3 Grade View: Grade I Tube type: Oral Tube size: 7.5 mm Number of attempts: 1 Airway Equipment and Method: Stylet and Oral airway Placement Confirmation: ETT inserted through vocal cords under direct vision,  positive ETCO2 and breath sounds checked- equal and bilateral Secured at: 22 cm Tube secured with: Tape Dental Injury: Teeth and Oropharynx as per pre-operative assessment

## 2020-05-29 NOTE — Anesthesia Postprocedure Evaluation (Signed)
Anesthesia Post Note  Patient: Adam Fowler  Procedure(s) Performed: ESOPHAGOGASTRODUODENOSCOPY (EGD) WITH PROPOFOL (N/A ) FLUOROSCOPY GUIDANCE (N/A )     Patient location during evaluation: Endoscopy Anesthesia Type: General Level of consciousness: awake and alert Pain management: pain level controlled Vital Signs Assessment: post-procedure vital signs reviewed and stable Respiratory status: spontaneous breathing, nonlabored ventilation and respiratory function stable Cardiovascular status: blood pressure returned to baseline and stable Postop Assessment: no apparent nausea or vomiting Anesthetic complications: no   No complications documented.  Last Vitals:  Vitals:   05/29/20 1200 05/29/20 1210  BP: 133/73 123/66  Pulse: (!) 117 (!) 120  Resp: 20 17  Temp:    SpO2: 96% 96%    Last Pain:  Vitals:   05/29/20 1210  TempSrc:   PainSc: 0-No pain                 Tessah Patchen,W. EDMOND

## 2020-05-29 NOTE — Interval H&P Note (Signed)
History and Physical Interval Note:  05/29/2020 9:00 AM  Adam Fowler  has presented today for surgery, with the diagnosis of gastric outlet obstruction, possible CGE.Marland Kitchen  The various methods of treatment have been discussed with the patient and family. After consideration of risks, benefits and other options for treatment, the patient has consented to  Procedure(s): ESOPHAGOGASTRODUODENOSCOPY (EGD) WITH PROPOFOL (N/A) as a surgical intervention.  The patient's history has been reviewed, patient examined, no change in status, stable for surgery.  I have reviewed the patient's chart and labs.  Questions were answered to the patient's satisfaction.     [employer]

## 2020-05-30 LAB — GLUCOSE, CAPILLARY
Glucose-Capillary: 220 mg/dL — ABNORMAL HIGH (ref 70–99)
Glucose-Capillary: 207 mg/dL — ABNORMAL HIGH (ref 70–99)
Glucose-Capillary: 182 mg/dL — ABNORMAL HIGH (ref 70–99)
Glucose-Capillary: 232 mg/dL — ABNORMAL HIGH (ref 70–99)

## 2020-05-30 LAB — BASIC METABOLIC PANEL
Anion gap: 10 (ref 5–15)
BUN: 43 mg/dL — ABNORMAL HIGH (ref 8–23)
CO2: 25 mmol/L (ref 22–32)
Calcium: 8.2 mg/dL — ABNORMAL LOW (ref 8.9–10.3)
Chloride: 109 mmol/L (ref 98–111)
Creatinine, Ser: 0.99 mg/dL (ref 0.61–1.24)
GFR calc Af Amer: >60 mL/min (ref >60)
GFR calc non Af Amer: >60 mL/min (ref >60)
Glucose, Bld: 260 mg/dL — ABNORMAL HIGH (ref 70–99)
Potassium: 3.8 mmol/L (ref 3.5–5.1)
Sodium: 144 mmol/L (ref 135–145)

## 2020-05-30 MED ORDER — POTASSIUM CHLORIDE 10 MEQ/50ML IV SOLN
10.0000 meq | INTRAVENOUS | Status: AC
  Administered 2020-05-30 (×4): 10 meq via INTRAVENOUS
  Filled 2020-05-30 (×4): qty 50

## 2020-05-30 MED ORDER — INSULIN ASPART 100 UNIT/ML ~~LOC~~ SOLN
0.0000 [IU] | SUBCUTANEOUS | Status: DC
  Administered 2020-05-30 (×2): 7 [IU] via SUBCUTANEOUS
  Administered 2020-05-30 – 2020-05-31 (×2): 4 [IU] via SUBCUTANEOUS
  Administered 2020-05-31: 7 [IU] via SUBCUTANEOUS
  Administered 2020-05-31: 3 [IU] via SUBCUTANEOUS
  Administered 2020-06-01: 4 [IU] via SUBCUTANEOUS
  Administered 2020-06-01: 7 [IU] via SUBCUTANEOUS
  Administered 2020-06-01 (×2): 3 [IU] via SUBCUTANEOUS
  Administered 2020-06-01: 4 [IU] via SUBCUTANEOUS
  Administered 2020-06-01: 7 [IU] via SUBCUTANEOUS
  Administered 2020-06-02: 3 [IU] via SUBCUTANEOUS
  Administered 2020-06-02 (×3): 4 [IU] via SUBCUTANEOUS
  Administered 2020-06-02: 7 [IU] via SUBCUTANEOUS
  Administered 2020-06-02 (×2): 4 [IU] via SUBCUTANEOUS
  Administered 2020-06-03: 3 [IU] via SUBCUTANEOUS
  Administered 2020-06-03: 4 [IU] via SUBCUTANEOUS
  Administered 2020-06-03 – 2020-06-04 (×3): 3 [IU] via SUBCUTANEOUS
  Administered 2020-06-04 (×2): 4 [IU] via SUBCUTANEOUS
  Administered 2020-06-04 (×2): 3 [IU] via SUBCUTANEOUS
  Administered 2020-06-05 (×2): 4 [IU] via SUBCUTANEOUS
  Administered 2020-06-05 (×2): 3 [IU] via SUBCUTANEOUS
  Administered 2020-06-05: 4 [IU] via SUBCUTANEOUS
  Administered 2020-06-06 (×2): 3 [IU] via SUBCUTANEOUS
  Administered 2020-06-06: 4 [IU] via SUBCUTANEOUS
  Administered 2020-06-06 – 2020-06-07 (×2): 3 [IU] via SUBCUTANEOUS
  Administered 2020-06-07 (×5): 4 [IU] via SUBCUTANEOUS
  Administered 2020-06-07 – 2020-06-08 (×2): 3 [IU] via SUBCUTANEOUS
  Administered 2020-06-08 (×2): 4 [IU] via SUBCUTANEOUS
  Administered 2020-06-08: 3 [IU] via SUBCUTANEOUS
  Administered 2020-06-08 – 2020-06-09 (×2): 4 [IU] via SUBCUTANEOUS
  Administered 2020-06-09: 3 [IU] via SUBCUTANEOUS
  Administered 2020-06-09 (×2): 4 [IU] via SUBCUTANEOUS
  Administered 2020-06-09: 3 [IU] via SUBCUTANEOUS
  Administered 2020-06-09 – 2020-06-10 (×2): 4 [IU] via SUBCUTANEOUS
  Administered 2020-06-10: 3 [IU] via SUBCUTANEOUS
  Administered 2020-06-10 – 2020-06-11 (×5): 4 [IU] via SUBCUTANEOUS
  Administered 2020-06-11: 3 [IU] via SUBCUTANEOUS
  Administered 2020-06-11: 4 [IU] via SUBCUTANEOUS
  Administered 2020-06-11 – 2020-06-13 (×14): 3 [IU] via SUBCUTANEOUS
  Administered 2020-06-13: 4 [IU] via SUBCUTANEOUS
  Administered 2020-06-13 – 2020-06-15 (×9): 3 [IU] via SUBCUTANEOUS

## 2020-05-30 MED ORDER — TRAVASOL 10 % IV SOLN
INTRAVENOUS | Status: AC
  Filled 2020-05-30: qty 1438.8

## 2020-05-30 NOTE — Progress Notes (Signed)
PROGRESS NOTE    Adam Fowler  PTW:[id number] DOB: 1956/08/25 DOA: 04/26/2020 PCP: Melony Overly, MD    Brief Narrative:  64 year old male with past medical history notable for essential hypertension who underwent ERCP on 04/25/2020 for biliary stent exchange for persistent postoperative bile leak related to recent cholecystectomy with abscess status post percutaneous drain in which he developed severe epigastric abdominal pain associate with nausea, vomiting.  CT abdomen/pelvis notable for changes consistent with acute pancreatitis without abscess or drainable fluid collection likely related to postoperative ERCP pancreatitis.  Assessment & Plan:   Principal Problem:   Post-ERCP acute pancreatitis Active Problems:   Severe sepsis (HCC)   Polycythemia   AKI (acute kidney injury) ([place])   Metabolic acidosis   Acute respiratory failure with hypoxia (HCC)   Bilateral pleural effusion   Ileus (HCC)   Bile duct leak   Gastric outlet obstruction   Abdominal distention   ARDS (adult respiratory distress syndrome) ([place])   Biloma following surgery   Acute blood loss anemia   Acute post ERCP pancreatitis/ileus Bile leak following cholecystectomy, persistent Patient presenting following increased abdominal pain following ERCP.  Lipase 1379.  CT abdomen/pelvis notable for pancreatitis.  Previous cholecystectomy with bile leak that has persisted despite bile duct stenting.  Gallbladder fossa drain in place placed by IR on 02/13/2020. Repeat CT A//P w/ contrast 9/18 with moderate peripancreatic fluid unchanged, organized fluid likely representing pseudocyst less likely abscess, pigtail drainage catheter in gallbladder fossa, common bile duct stent unchanged, moderate ascites. NG tube pulled out on 05/22/2020, diet advanced to full liquids by GI on 9/15, and not tolerating with return of emesis; replaced NGT to [facility] 9/20. --[place] [facility]/Gen Surgery following, appreciate assistance --Continue TPN  with pharmacy monitoring/dosing --Continue to monitor gallbladder fossa drain output, 241m past 24 hours --Hepatobiliary surgery recommended IR placement of PTC, placed 9/21 -Appreciate Surgery input  Coffee-ground emesis EGD with grade C/D esophagitis with active oozing stigmata of recent bleeding, gastritis with hemorrhage.  --Protonix 40 mg IV every 12 hours --N.p.o. and continue TPN for now -Repeat CBC in AM  Acute hypoxic respiratory failure: Resolved Bilateral pleural effusions On 04/28/2020, patient developed severe respiratory distress, rapid response patient was transferred to the ICU with mechanical ventilation.  Etiology volume overload with aspiration pneumonia.  Was successfully extubated on 05/01/2020.  Completed course of antibiotics with vancomycin, Zosyn and meropenem.  Supplemental oxygen has been titrated off and oxygenating well on room air.  CXR 9/16 with stable patchy bilateral airspace disease, left greater than right with suspicion of small left pleural effusion. --Strict I's and O's and daily weights --Continue with supportive care  Acute renal failure: Resolved Patient presenting with creatinine 1.41.  Etiology likely poor oral intake coupled with acute pancreatitis as above.  Patient was treated with IV fluid hydration, now supported with TPN with further advancement of diet.  -Renal function now normalized with Cr of 0.9  Hypokalemia Hypomagnesemia Repleted, currently on TPN.   -Continue to replace as tolerated. K of 3.8 today  Paroxysmal atrial fibrillation, transient: Resolved Spontaneously converted to normal sinus rhythm, will CHA2DS2-VASc score = 1, low risk of thrombosis.  Telemetry was discontinued.  Chronic pain syndrome: On oxycodone 10 mg every 4 hours as needed for pain at home.  On x-ray 05/25/2020 with mild lumbar degenerative change, no acute abnormality. --Tylenol 650 mg every 6 hours scheduled --Increased OxyContin to 20 mg p.o. q12h on  9/18 --Lidocaine patch --OxyIR, 5 mg q4h prn for moderate breakthrough pain --  Dilaudid 1 mg IV q3h prn severe breakthrough pain  Anemia of chronic disease Etiology likely secondary to malnutrition.  Hemoglobin 10.3, stable. --Continue to monitor hemoglobin, transfuse for hemoglobin less than 7.0.  Essential hypertension On lisinopril and HCTZ at home.  --Continue to hold home antihypertensives at this time  Dyslipidemia: Continue pravastatin as tolerated  Hyperglycemia Hemoglobin A1c 5.6. --Continue insulin therapy per TPN protocol and moderate sliding scale --CBGs Q4h  Moderate calorie protein malnutrition Nutrition Status: Nutrition Problem: Inadequate oral intake Etiology: acute illness Signs/Symptoms: NPO status Interventions: TPN, Ensure Enlive (each supplement provides 350kcal and 20 grams of protein) --Continue TPN, pharmacy consulted for assistance dosing/monitoring --Resume NPO 9/19  Depression/anxiety: Continue sertraline and trazodone  DVT prophylaxis: Lovenox subq Code Status: Full Family Communication: Pt in room, family not at bedside  Status is: Inpatient  Remains inpatient appropriate because:IV treatments appropriate due to intensity of illness or inability to take PO   Dispo:  Patient From: Home  Planned Disposition: Home  Expected discharge date: 06/04/20  Medically stable for discharge: No        Consultants:   GI  General Surgery  IR  Procedures:   ERCP 9/9  Antimicrobials: Anti-infectives (From admission, onward)   Start     Dose/Rate Route Frequency Ordered Stop   05/29/20 0800  piperacillin-tazobactam (ZOSYN) IVPB 3.375 g        3.375 g 12.5 mL/hr over 240 Minutes Intravenous To Radiology 05/28/20 1328 05/29/20 1943   05/04/20 1230  meropenem (MERREM) 1 g in sodium chloride 0.9 % 100 mL IVPB  Status:  Discontinued        1 g 200 mL/hr over 30 Minutes Intravenous Every 8 hours 05/04/20 1156 05/19/20 0826   05/01/20  1400  piperacillin-tazobactam (ZOSYN) IVPB 3.375 g        3.375 g 12.5 mL/hr over 240 Minutes Intravenous Every 8 hours 05/01/20 1013 05/03/20 0907   04/29/20 2000  vancomycin (VANCOCIN) IVPB 1000 mg/200 mL premix  Status:  Discontinued        1,000 mg 200 mL/hr over 60 Minutes Intravenous Every 12 hours 04/29/20 0653 04/30/20 0831   04/29/20 0745  vancomycin (VANCOREADY) IVPB 1500 mg/300 mL        1,500 mg 150 mL/hr over 120 Minutes Intravenous  Once 04/29/20 0653 04/29/20 1129   04/26/20 2300  piperacillin-tazobactam (ZOSYN) IVPB 3.375 g        3.375 g 12.5 mL/hr over 240 Minutes Intravenous Every 8 hours 04/26/20 1716 05/01/20 0559   04/26/20 1700  piperacillin-tazobactam (ZOSYN) IVPB 3.375 g        3.375 g 100 mL/hr over 30 Minutes Intravenous  Once 04/26/20 1655 04/26/20 1844       Subjective: Complaining of continued flank pain  Objective: Vitals:   05/29/20 1715 05/29/20 1720 05/29/20 2036 05/30/20 0346  BP: 123/82  110/63 125/73  Pulse: 96 99 98 (!) 103  Resp: 19 15 16 19   Temp:   98.1 F (36.7 C) 98.2 F (36.8 C)  TempSrc:   Oral Oral  SpO2: 97% 100% 97% 95%  Weight:      Height:        Intake/Output Summary (Last 24 hours) at 05/30/2020 1727 Last data filed at 05/30/2020 1300 Gross per 24 hour  Intake 1195.43 ml  Output 975 ml  Net 220.43 ml   Filed Weights   05/24/20 0500 05/26/20 0500 05/28/20 0500  Weight: 86.3 kg 84.6 kg 83.5 kg    Examination:  General  exam: Appears calm but uncomfortable Respiratory system: Clear to auscultation. Respiratory effort normal. Cardiovascular system: S1 & S2 heard, Regular Gastrointestinal system: Abdomen is nondistended, soft, pos BS Central nervous system: Alert and oriented. No focal neurological deficits. Extremities: Symmetric 5 x 5 power. Skin: No rashes, lesions  Psychiatry: Judgement and insight appear normal. Mood & affect appropriate.   Data Reviewed: I have personally reviewed following labs and imaging  studies  CBC: Recent Labs  Lab 05/25/20 0359 05/26/20 1630 05/27/20 0355 05/28/20 0350 05/29/20 0841  WBC 23.6* 29.6* 25.5* 23.3* 23.6*  NEUTROABS  --   --   --  19.0*  --   HGB 10.3* 10.4* 9.6* 8.8* 8.6*  HCT 33.1* 34.4* 31.1* 28.6* 29.5*  MCV 88.5 90.5 90.4 90.2 91.9  PLT 263 320 332 321 786   Basic Metabolic Panel: Recent Labs  Lab 05/24/20 0405 05/24/20 0405 05/25/20 0359 05/25/20 0359 05/26/20 0349 05/27/20 0355 05/28/20 0350 05/29/20 0404 05/30/20 0514  NA 135   < > 136   < > 141 142 142 146* 144  K 3.9   < > 4.3   < > 4.1 4.2 3.8 3.7 3.8  CL 104   < > 103   < > 104 102 103 109 109  CO2 24   < > 24   < > 28 31 28 27 25   GLUCOSE 209*   < > 186*   < > 320* 344* 297* 203* 260*  BUN 20   < > 20   < > 28* 37* 41* 39* 43*  CREATININE 0.66   < > 0.58*   < > 0.61 0.75 0.82 0.92 0.99  CALCIUM 8.6*   < > 8.6*   < > 8.3* 8.4* 8.2* 8.3* 8.2*  MG 2.0  --  2.1  --  2.1 2.1 2.1  --   --   PHOS 3.7  --  3.9  --  3.6 3.9 3.2  --   --    < > = values in this interval not displayed.   GFR: Estimated Creatinine Clearance: 75.4 mL/min (by C-G formula based on SCr of 0.99 mg/dL). Liver Function Tests: Recent Labs  Lab 05/24/20 0405 05/28/20 0350  AST 20 28  ALT 17 29  ALKPHOS 220* 303*  BILITOT 0.7 0.9  PROT 5.7* 5.7*  ALBUMIN 1.6* 1.4*   No results for input(s): LIPASE, AMYLASE in the last 168 hours. No results for input(s): AMMONIA in the last 168 hours. Coagulation Profile: Recent Labs  Lab 05/29/20 0404  INR 1.4*   Cardiac Enzymes: No results for input(s): CKTOTAL, CKMB, CKMBINDEX, TROPONINI in the last 168 hours. BNP (last 3 results) No results for input(s): PROBNP in the last 8760 hours. HbA1C: No results for input(s): HGBA1C in the last 72 hours. CBG: Recent Labs  Lab 05/29/20 1238 05/29/20 2032 05/30/20 0420 05/30/20 1227 05/30/20 1611  GLUCAP 228* 268* 220* 207* 182*   Lipid Profile: Recent Labs    05/28/20 0350  TRIG 162*   Thyroid  Function Tests: No results for input(s): TSH, T4TOTAL, FREET4, T3FREE, THYROIDAB in the last 72 hours. Anemia Panel: No results for input(s): VITAMINB12, FOLATE, FERRITIN, TIBC, IRON, RETICCTPCT in the last 72 hours. Sepsis Labs: No results for input(s): PROCALCITON, LATICACIDVEN in the last 168 hours.  No results found for this or any previous visit (from the past 240 hour(s)).   Radiology Studies: DG C-Arm 1-60 Min-No Report  Result Date: 05/29/2020 Fluoroscopy was utilized by the requesting physician.  No radiographic interpretation.   IR CHOLANGIOGRAM EXISTING TUBE  Result Date: 05/30/2020 Narrative & Impression INDICATION: 64 year old male with history of complex postoperative course after subtotal cholecystectomy for acute gangrenous cholecystitis in May 7893 complicated by biloma formation requiring percutaneous drainage on 02/13/2020 and placement of metallic common bile duct stent on 04/26/2020. Most recently, the patient has developed pancreatitis and duodenitis with ERCP findings consistent with obstruction of the distal common bile duct stent and subsequently high output from the biloma drain. Percutaneous cholangiogram and internal external biliary drain placement is requested for decompression and management of biliary fluid losses.  EXAM: 1. Ultrasound-guided paracentesis 2. Cholangiogram via indwelling gallbladder fossa drain 3. Percutaneous cholangiogram 4. Placement of right-sided internal external biliary drain  MEDICATIONS: Zosyn 3.375 g; The antibiotic was administered within an appropriate time frame prior to the initiation of the procedure.  ANESTHESIA/SEDATION: Moderate (conscious) sedation was employed during this procedure. A total of Versed 2 mg and Fentanyl 75 mcg was administered intravenously.  Moderate Sedation Time: 55 minutes. The patient's level of consciousness and vital signs were monitored continuously by radiology nursing throughout the procedure under my  direct supervision.  FLUOROSCOPY TIME:  Fluoroscopy Time: 17 minutes 18 seconds (81 mGy).  COMPLICATIONS: None immediate.  PROCEDURE: Informed written consent was obtained from the patient after a thorough discussion of the procedural risks, benefits and alternatives. All questions were addressed. Maximal Sterile Barrier Technique was utilized including caps, mask, sterile gowns, sterile gloves, sterile drape, hand hygiene and skin antiseptic. A timeout was performed prior to the initiation of the procedure.  Preprocedure ultrasound demonstrated moderate to large volume ascites, including in the perihepatic space. The largest fluid pocket visualized in the right lower quadrant. Therefore the right lower quadrant was prepped and draped in standard fashion. The puncture site was planned. Subdermal Local anesthesia was provided with 1% lidocaine with 1:100,000 epinephrine, followed by deeper local anesthetic to the level of the peritoneum under ultrasound guidance. A 5 [place] Yueh needle was advanced into the peritoneal fluid under ultrasound guidance. A total of 3 L of clear, straw-colored fluid was removed during the course of the procedure.  The right upper quadrant and indwelling biloma drain were prepped and draped in standard fashion. Hand injection of contrast via the indwelling biloma drain demonstrated appropriate position within the gallbladder fossa as well as a patent cystic duct. Contrast was visualized within the metallic biliary stent and filled retrograde into the right, nondilated bile ducts. The distal common bile duct stent was occluded. A right posterior duct was identified is in adequate puncture site for biliary drain. A small amount of subdermal Local anesthesia was provided to the right mid axillary line at planned entry site as well as deeper along the hepatic capsule. Under fluoroscopic guidance with multiple obliquities, a 22 gauge Chiba needle was directed toward the opacified right  posterior duct. The duct was punctured successfully, confirmed by gentle hand injection of contrast. A Nitrex wire was then directed into the right posterior duct through the metallic common bile duct stent and into the duodenum without difficulty. A 6 French coaxial introducer set was placed over the Nitrex wire. The inner cannula were removed and an Amplatz wire was directed adjacent to the Nitrex wire into the duodenum near the ligament of Treitz. Serial dilation was performed with 8 and 10 [place] dilators. A 10.2 [place] biliary drain with 10 cm of custom cut extra sideholes was then introduced with the pigtail portion coiled in the fourth portion of the  duodenum near the ligament of Treitz. Hand injection of contrast demonstrated opacification of the nondilated biliary tree which passed freely into the small bowel. The drain was affixed to the skin with 0 Prolene suture and a Stay Fix dressing.  The patient tolerated the procedure well and was returned to the floor in stable condition.  IMPRESSION: 1. Occluded indwelling metallic biliary stent at the distal end, presumed secondary to edema related to duodenitis/pancreatitis. 2. Indwelling biloma drain in satisfactory position. The cystic duct is patent. 3. Successful placement of an internal external biliary drain via right posterior bile ducts. The distal pigtail portion of the drain is positioned near the ligament of Treitz. 4. Ultrasound-guided paracentesis yielding 3 L of clear, straw-colored fluid.  PLAN:  Recommend capping biloma drain once any post-procedural hemobilia has resolved. Recommend capping trial of biliary drain 12-24 hours after capping of biloma drain. IR will follow closely.   Electronically Signed   By: Ruthann Cancer MD   On: 05/30/2020 08:22  Result History   IR INT EXT BILIARY DRAIN WITH CHOLANGIOGRAM  Result Date: 05/30/2020 INDICATION: 64 year old male with history of complex postoperative course after subtotal  cholecystectomy for acute gangrenous cholecystitis in May 3244 complicated by biloma formation requiring percutaneous drainage on 02/13/2020 and placement of metallic common bile duct stent on 04/26/2020. Most recently, the patient has developed pancreatitis and duodenitis with ERCP findings consistent with obstruction of the distal common bile duct stent and subsequently high output from the biloma drain. Percutaneous cholangiogram and internal external biliary drain placement is requested for decompression and management of biliary fluid losses. EXAM: 1. Ultrasound-guided paracentesis 2. Cholangiogram via indwelling gallbladder fossa drain 3. Percutaneous cholangiogram 4. Placement of right-sided internal external biliary drain MEDICATIONS: Zosyn 3.375 g; The antibiotic was administered within an appropriate time frame prior to the initiation of the procedure. ANESTHESIA/SEDATION: Moderate (conscious) sedation was employed during this procedure. A total of Versed 2 mg and Fentanyl 75 mcg was administered intravenously. Moderate Sedation Time: 55 minutes. The patient's level of consciousness and vital signs were monitored continuously by radiology nursing throughout the procedure under my direct supervision. FLUOROSCOPY TIME:  Fluoroscopy Time: 17 minutes 18 seconds (81 mGy). COMPLICATIONS: None immediate. PROCEDURE: Informed written consent was obtained from the patient after a thorough discussion of the procedural risks, benefits and alternatives. All questions were addressed. Maximal Sterile Barrier Technique was utilized including caps, mask, sterile gowns, sterile gloves, sterile drape, hand hygiene and skin antiseptic. A timeout was performed prior to the initiation of the procedure. Preprocedure ultrasound demonstrated moderate to large volume ascites, including in the perihepatic space. The largest fluid pocket visualized in the right lower quadrant. Therefore the right lower quadrant was prepped and draped  in standard fashion. The puncture site was planned. Subdermal Local anesthesia was provided with 1% lidocaine with 1:100,000 epinephrine, followed by deeper local anesthetic to the level of the peritoneum under ultrasound guidance. A 5 [place] Yueh needle was advanced into the peritoneal fluid under ultrasound guidance. A total of 3 L of clear, straw-colored fluid was removed during the course of the procedure. The right upper quadrant and indwelling biloma drain were prepped and draped in standard fashion. Hand injection of contrast via the indwelling biloma drain demonstrated appropriate position within the gallbladder fossa as well as a patent cystic duct. Contrast was visualized within the metallic biliary stent and filled retrograde into the right, nondilated bile ducts. The distal common bile duct stent was occluded. A right posterior duct was identified is  in adequate puncture site for biliary drain. A small amount of subdermal Local anesthesia was provided to the right mid axillary line at planned entry site as well as deeper along the hepatic capsule. Under fluoroscopic guidance with multiple obliquities, a 22 gauge Chiba needle was directed toward the opacified right posterior duct. The duct was punctured successfully, confirmed by gentle hand injection of contrast. A Nitrex wire was then directed into the right posterior duct through the metallic common bile duct stent and into the duodenum without difficulty. A 6 French coaxial introducer set was placed over the Nitrex wire. The inner cannula were removed and an Amplatz wire was directed adjacent to the Nitrex wire into the duodenum near the ligament of Treitz. Serial dilation was performed with 8 and 10 [place] dilators. A 10.2 [place] biliary drain with 10 cm of custom cut extra sideholes was then introduced with the pigtail portion coiled in the fourth portion of the duodenum near the ligament of Treitz. Hand injection of contrast demonstrated  opacification of the nondilated biliary tree which passed freely into the small bowel. The drain was affixed to the skin with 0 Prolene suture and a Stay Fix dressing. The patient tolerated the procedure well and was returned to the floor in stable condition. IMPRESSION: 1. Occluded indwelling metallic biliary stent at the distal end, presumed secondary to edema related to duodenitis/pancreatitis. 2. Indwelling biloma drain in satisfactory position. The cystic duct is patent. 3. Successful placement of an internal external biliary drain via right posterior bile ducts. The distal pigtail portion of the drain is positioned near the ligament of Treitz. 4. Ultrasound-guided paracentesis yielding 3 L of clear, straw-colored fluid. PLAN: Recommend capping biloma drain once any post-procedural hemobilia has resolved. Recommend capping trial of biliary drain 12-24 hours after capping of biloma drain. IR will follow closely. Electronically Signed   By: Ruthann Cancer MD   On: 05/30/2020 08:22   IR Paracentesis  Result Date: 05/30/2020 INDICATION: 64 year old male with history of complex postoperative course after subtotal cholecystectomy for acute gangrenous cholecystitis in May 7564 complicated by biloma formation requiring percutaneous drainage on 02/13/2020 and placement of metallic common bile duct stent on 04/26/2020. Most recently, the patient has developed pancreatitis and duodenitis with ERCP findings consistent with obstruction of the distal common bile duct stent and subsequently high output from the biloma drain. Percutaneous cholangiogram and internal external biliary drain placement is requested for decompression and management of biliary fluid losses. EXAM: 1. Ultrasound-guided paracentesis 2. Cholangiogram via indwelling gallbladder fossa drain 3. Percutaneous cholangiogram 4. Placement of right-sided internal external biliary drain MEDICATIONS: Zosyn 3.375 g; The antibiotic was administered within an  appropriate time frame prior to the initiation of the procedure. ANESTHESIA/SEDATION: Moderate (conscious) sedation was employed during this procedure. A total of Versed 2 mg and Fentanyl 75 mcg was administered intravenously. Moderate Sedation Time: 55 minutes. The patient's level of consciousness and vital signs were monitored continuously by radiology nursing throughout the procedure under my direct supervision. FLUOROSCOPY TIME:  Fluoroscopy Time: 17 minutes 18 seconds (81 mGy). COMPLICATIONS: None immediate. PROCEDURE: Informed written consent was obtained from the patient after a thorough discussion of the procedural risks, benefits and alternatives. All questions were addressed. Maximal Sterile Barrier Technique was utilized including caps, mask, sterile gowns, sterile gloves, sterile drape, hand hygiene and skin antiseptic. A timeout was performed prior to the initiation of the procedure. Preprocedure ultrasound demonstrated moderate to large volume ascites, including in the perihepatic space. The largest fluid pocket visualized  in the right lower quadrant. Therefore the right lower quadrant was prepped and draped in standard fashion. The puncture site was planned. Subdermal Local anesthesia was provided with 1% lidocaine with 1:100,000 epinephrine, followed by deeper local anesthetic to the level of the peritoneum under ultrasound guidance. A 5 [place] Yueh needle was advanced into the peritoneal fluid under ultrasound guidance. A total of 3 L of clear, straw-colored fluid was removed during the course of the procedure. The right upper quadrant and indwelling biloma drain were prepped and draped in standard fashion. Hand injection of contrast via the indwelling biloma drain demonstrated appropriate position within the gallbladder fossa as well as a patent cystic duct. Contrast was visualized within the metallic biliary stent and filled retrograde into the right, nondilated bile ducts. The distal common bile  duct stent was occluded. A right posterior duct was identified is in adequate puncture site for biliary drain. A small amount of subdermal Local anesthesia was provided to the right mid axillary line at planned entry site as well as deeper along the hepatic capsule. Under fluoroscopic guidance with multiple obliquities, a 22 gauge Chiba needle was directed toward the opacified right posterior duct. The duct was punctured successfully, confirmed by gentle hand injection of contrast. A Nitrex wire was then directed into the right posterior duct through the metallic common bile duct stent and into the duodenum without difficulty. A 6 French coaxial introducer set was placed over the Nitrex wire. The inner cannula were removed and an Amplatz wire was directed adjacent to the Nitrex wire into the duodenum near the ligament of Treitz. Serial dilation was performed with 8 and 10 [place] dilators. A 10.2 [place] biliary drain with 10 cm of custom cut extra sideholes was then introduced with the pigtail portion coiled in the fourth portion of the duodenum near the ligament of Treitz. Hand injection of contrast demonstrated opacification of the nondilated biliary tree which passed freely into the small bowel. The drain was affixed to the skin with 0 Prolene suture and a Stay Fix dressing. The patient tolerated the procedure well and was returned to the floor in stable condition. IMPRESSION: 1. Occluded indwelling metallic biliary stent at the distal end, presumed secondary to edema related to duodenitis/pancreatitis. 2. Indwelling biloma drain in satisfactory position. The cystic duct is patent. 3. Successful placement of an internal external biliary drain via right posterior bile ducts. The distal pigtail portion of the drain is positioned near the ligament of Treitz. 4. Ultrasound-guided paracentesis yielding 3 L of clear, straw-colored fluid. PLAN: Recommend capping biloma drain once any post-procedural hemobilia has  resolved. Recommend capping trial of biliary drain 12-24 hours after capping of biloma drain. IR will follow closely. Electronically Signed   By: Ruthann Cancer MD   On: 05/30/2020 08:22    Scheduled Meds: . acetaminophen  650 mg Oral Q6H  . acidophilus  2 capsule Oral Daily  . Chlorhexidine Gluconate Cloth  6 each Topical Daily  . enoxaparin (LOVENOX) injection  40 mg Subcutaneous Q24H  . influenza vac split quadrivalent PF  0.5 mL Intramuscular Tomorrow-1000  . insulin aspart  0-20 Units Subcutaneous Q4H  . mouth rinse  15 mL Mouth Rinse BID  . oxyCODONE  20 mg Oral Q12H  . pantoprazole (PROTONIX) IV  40 mg Intravenous Q12H  . polyethylene glycol  17 g Oral Daily  . pravastatin  20 mg Oral q1800  . sertraline  100 mg Oral Daily  . sodium chloride flush  10-40 mL Intracatheter  Q12H  . sodium chloride flush  5 mL Intracatheter Q8H  . traZODone  50 mg Oral QHS   Continuous Infusions: . sodium chloride Stopped (05/07/20 0923)  . TPN CYCLIC-ADULT (ION)       LOS: 33 days   Marylu Lund, MD [facility] Pager On Amion  If 7PM-7AM, please contact night-coverage 05/30/2020, 5:27 PM

## 2020-05-30 NOTE — Progress Notes (Signed)
Patient still refusing NGT at this time.

## 2020-05-30 NOTE — Progress Notes (Signed)
PHARMACY - TOTAL PARENTERAL NUTRITION CONSULT NOTE  Indication:  Severe pancreatitis, biliary fistula  Patient Measurements: Height: 5' 9"  (175.3 cm) Weight: 83.5 kg (184 lb 1.4 oz) IBW/kg (Calculated) : 70.7 TPN AdjBW (KG): 86.1 Body mass index is 27.18 kg/m.  Assessment:  42 YOM admitted 04/26/20, post-ERCP 8/18 and biliary stent exchange for persistent postoperative bile leak related to recent cholecystectomy with abscess s/p percutaneous drainage. Patient developed acute pancreatitis, has had a prolonged hospitalization complicated by VDRF (extubated 8/24), AKI, encephalopathy, and afib. TPN initiated 9/11 for acute post-ERCP pancreatitis/ileus and persistent biliary leak. Significant gastric fluid retention from functional outlet obstruction. Pt still with very poor PO intake.   Glucose / Insulin: A1c 5.6% - CBGs uncontrolled, 200-260s. 16 units mSSI utilized in last 24 hrs, 30 units regular insulin in TPN.  -9/22: CBG previously controlled, increase in CBG possibly related to increased stress and mtn dew intake.  Electrolytes: K 3.8- with KCl x3 (ileus goal >/= 4), corr Ca high-normal ~10.3; others WNL Renal: SCr 0.99 (up), BUN 43 (up).  LFTs / TGs: LFTs/Tbili WNL. Alk phos 303 (up), TG 162 Prealbumin / albumin: Prealbumin 8.2 (up). Albumin 1.4 (down) Intake / Output; MIVF: UOP 0.81m/kg/hr. LBM 9/17. Drain 2760m24hrs. NGT 40064m4hrs, I/O -2383 since 9/8 GI Imaging:  9/5: CT - severe pancreatitis 9/7: HIDA - persistent bile leak with accumulations/paracolic gutter 9/9: CT - pancreatitis (peripancreatic fat stranding and peripancreatic fluid, percutaneous drain at the gallbladder fossa, unchanged biliary stent with no intrahepatic biliary duct dilatation, bilateral pleural effusions 9/15: KUB - diffuse colonic stool, no bowel obstruction/free air 9/18: CT abd/pelvis - mild colonic diverticulosis, peripancreatic fluid indicative of pseudocyst involvement and less likely abscess,  otherwise unchanged Surgeries / Procedures: 8/18: ERCP/biliary stent exchange 9/9: Unsuccessful ERCP 9/10: Paracentesis - 1.4L out 9/21: EGD- Esophagitis w/ active oozing and stigmata of recent bleeding, gastritis w/ hemorrhage, congested duodenal mucosa; NGT trauma  9/21: Paracentesis/biliary drain placement/cholangiogram/drain check- 3L removed  Central access: PICC placed 05/19/20 TPN start date: 05/19/20  Nutritional Goals (per RD rec on 05/18/20): kCal: 2300-2500, Protein: 130-145g, Fluid: >2.3L/day  Current Nutrition: TPN  Plan:  Continue 18 hour cyclic TPN at [id number] 2398 ml over 18 hrs: 71 ml/hr x 1, then 141 ml/hr x 16 hrs, then 71 ml/hr x 1 hr TPN will provide 144g AA, 312g CHO and 72g ILE for a total of 2355 kCal, meeting 100% of needs  Electrolytes in TPN: Increase K to 91m82m and continue Na 60mE51m Mag 6mEq/75mPhos 4mmol/63mnd Ca 0mEq/L.62m:Ac 1:1. Add standard MVI and trace elements to TPN  Increase to rSSI q4h + continue 30 units insulin in TPN  KCl x4 runs F/u AM labs, GI / IR plans, CBGs and tolerance to 18-hr cy23-FTTPN, I/O's and need for MIVF  Adam Fowler RiRedgie Grayer Candidate  05/30/2020 7:08 AM

## 2020-05-30 NOTE — Progress Notes (Signed)
Subjective: Patient underwent placement of internal/external biliary drain yesterday in IR. Afebrile and hemodynamically stable. NG tube dislodged overnight, patient denies nausea this morning.  Objective: Vital signs in last 24 hours: Temp:  [98.1 F (36.7 C)-99.5 F (37.5 C)] 98.2 F (36.8 C) (09/22 0346) Pulse Rate:  [82-125] 103 (09/22 0346) Resp:  [15-34] 19 (09/22 0346) BP: (105-141)/(63-91) 125/73 (09/22 0346) SpO2:  [89 %-100 %] 95 % (09/22 0346) Last BM Date: 05/28/20  Intake/Output from previous day: 09/21 0701 - 09/22 0700 In: 1495.4 [I.V.:1495.4] Out: 1475 [Urine:700; Emesis/NG output:400; Drains:275] Intake/Output this shift: No intake/output data recorded.  PE: General: resting comfortably, NAD Neuro: alert and oriented Resp: normal work of breathing Abdomen: mildly distended but soft, improved from yesterday. RUQ biliary drain to gravity with bile in bag. RUQ gallbladder fossa drain with serous fluid.  Lab Results:  Recent Labs    05/28/20 0350 05/29/20 0841  WBC 23.3* 23.6*  HGB 8.8* 8.6*  HCT 28.6* 29.5*  PLT 321 304   BMET Recent Labs    05/29/20 0404 05/30/20 0514  NA 146* 144  K 3.7 3.8  CL 109 109  CO2 27 25  GLUCOSE 203* 260*  BUN 39* 43*  CREATININE 0.92 0.99  CALCIUM 8.3* 8.2*   PT/INR Recent Labs    05/29/20 0404  LABPROT 16.4*  INR 1.4*   CMP     Component Value Date/Time   NA 144 05/30/2020 0514   K 3.8 05/30/2020 0514   CL 109 05/30/2020 0514   CO2 25 05/30/2020 0514   GLUCOSE 260 (H) 05/30/2020 0514   BUN 43 (H) 05/30/2020 0514   CREATININE 0.99 05/30/2020 0514   CALCIUM 8.2 (L) 05/30/2020 0514   PROT 5.7 (L) 05/28/2020 0350   ALBUMIN 1.4 (L) 05/28/2020 0350   AST 28 05/28/2020 0350   ALT 29 05/28/2020 0350   ALKPHOS 303 (H) 05/28/2020 0350   BILITOT 0.9 05/28/2020 0350   GFRNONAA >60 05/30/2020 0514   GFRAA >60 05/30/2020 0514   Lipase     Component Value Date/Time   LIPASE 27 05/16/2020 1905        Studies/Results: DG C-Arm 1-60 Min-No Report  Result Date: 05/29/2020 Fluoroscopy was utilized by the requesting physician.  No radiographic interpretation.   IR INT EXT BILIARY DRAIN WITH CHOLANGIOGRAM  Result Date: 05/30/2020 INDICATION: 64 year old male with history of complex postoperative course after subtotal cholecystectomy for acute gangrenous cholecystitis in May 6712 complicated by biloma formation requiring percutaneous drainage on 02/13/2020 and placement of metallic common bile duct stent on 04/26/2020. Most recently, the patient has developed pancreatitis and duodenitis with ERCP findings consistent with obstruction of the distal common bile duct stent and subsequently high output from the biloma drain. Percutaneous cholangiogram and internal external biliary drain placement is requested for decompression and management of biliary fluid losses. EXAM: 1. Ultrasound-guided paracentesis 2. Cholangiogram via indwelling gallbladder fossa drain 3. Percutaneous cholangiogram 4. Placement of right-sided internal external biliary drain MEDICATIONS: Zosyn 3.375 g; The antibiotic was administered within an appropriate time frame prior to the initiation of the procedure. ANESTHESIA/SEDATION: Moderate (conscious) sedation was employed during this procedure. A total of Versed 2 mg and Fentanyl 75 mcg was administered intravenously. Moderate Sedation Time: 55 minutes. The patient's level of consciousness and vital signs were monitored continuously by radiology nursing throughout the procedure under my direct supervision. FLUOROSCOPY TIME:  Fluoroscopy Time: 17 minutes 18 seconds (81 mGy). COMPLICATIONS: None immediate. PROCEDURE: Informed written consent was obtained from  the patient after a thorough discussion of the procedural risks, benefits and alternatives. All questions were addressed. Maximal Sterile Barrier Technique was utilized including caps, mask, sterile gowns, sterile gloves, sterile  drape, hand hygiene and skin antiseptic. A timeout was performed prior to the initiation of the procedure. Preprocedure ultrasound demonstrated moderate to large volume ascites, including in the perihepatic space. The largest fluid pocket visualized in the right lower quadrant. Therefore the right lower quadrant was prepped and draped in standard fashion. The puncture site was planned. Subdermal Local anesthesia was provided with 1% lidocaine with 1:100,000 epinephrine, followed by deeper local anesthetic to the level of the peritoneum under ultrasound guidance. A 5 [place] Yueh needle was advanced into the peritoneal fluid under ultrasound guidance. A total of 3 L of clear, straw-colored fluid was removed during the course of the procedure. The right upper quadrant and indwelling biloma drain were prepped and draped in standard fashion. Hand injection of contrast via the indwelling biloma drain demonstrated appropriate position within the gallbladder fossa as well as a patent cystic duct. Contrast was visualized within the metallic biliary stent and filled retrograde into the right, nondilated bile ducts. The distal common bile duct stent was occluded. A right posterior duct was identified is in adequate puncture site for biliary drain. A small amount of subdermal Local anesthesia was provided to the right mid axillary line at planned entry site as well as deeper along the hepatic capsule. Under fluoroscopic guidance with multiple obliquities, a 22 gauge Chiba needle was directed toward the opacified right posterior duct. The duct was punctured successfully, confirmed by gentle hand injection of contrast. A Nitrex wire was then directed into the right posterior duct through the metallic common bile duct stent and into the duodenum without difficulty. A 6 French coaxial introducer set was placed over the Nitrex wire. The inner cannula were removed and an Amplatz wire was directed adjacent to the Nitrex wire into the  duodenum near the ligament of Treitz. Serial dilation was performed with 8 and 10 [place] dilators. A 10.2 [place] biliary drain with 10 cm of custom cut extra sideholes was then introduced with the pigtail portion coiled in the fourth portion of the duodenum near the ligament of Treitz. Hand injection of contrast demonstrated opacification of the nondilated biliary tree which passed freely into the small bowel. The drain was affixed to the skin with 0 Prolene suture and a Stay Fix dressing. The patient tolerated the procedure well and was returned to the floor in stable condition. IMPRESSION: 1. Occluded indwelling metallic biliary stent at the distal end, presumed secondary to edema related to duodenitis/pancreatitis. 2. Indwelling biloma drain in satisfactory position. The cystic duct is patent. 3. Successful placement of an internal external biliary drain via right posterior bile ducts. The distal pigtail portion of the drain is positioned near the ligament of Treitz. 4. Ultrasound-guided paracentesis yielding 3 L of clear, straw-colored fluid. PLAN: Recommend capping biloma drain once any post-procedural hemobilia has resolved. Recommend capping trial of biliary drain 12-24 hours after capping of biloma drain. IR will follow closely. Electronically Signed   By: Ruthann Cancer MD   On: 05/30/2020 08:22   IR Paracentesis  Result Date: 05/30/2020 INDICATION: 64 year old male with history of complex postoperative course after subtotal cholecystectomy for acute gangrenous cholecystitis in May 8416 complicated by biloma formation requiring percutaneous drainage on 02/13/2020 and placement of metallic common bile duct stent on 04/26/2020. Most recently, the patient has developed pancreatitis and duodenitis with ERCP  findings consistent with obstruction of the distal common bile duct stent and subsequently high output from the biloma drain. Percutaneous cholangiogram and internal external biliary drain placement is  requested for decompression and management of biliary fluid losses. EXAM: 1. Ultrasound-guided paracentesis 2. Cholangiogram via indwelling gallbladder fossa drain 3. Percutaneous cholangiogram 4. Placement of right-sided internal external biliary drain MEDICATIONS: Zosyn 3.375 g; The antibiotic was administered within an appropriate time frame prior to the initiation of the procedure. ANESTHESIA/SEDATION: Moderate (conscious) sedation was employed during this procedure. A total of Versed 2 mg and Fentanyl 75 mcg was administered intravenously. Moderate Sedation Time: 55 minutes. The patient's level of consciousness and vital signs were monitored continuously by radiology nursing throughout the procedure under my direct supervision. FLUOROSCOPY TIME:  Fluoroscopy Time: 17 minutes 18 seconds (81 mGy). COMPLICATIONS: None immediate. PROCEDURE: Informed written consent was obtained from the patient after a thorough discussion of the procedural risks, benefits and alternatives. All questions were addressed. Maximal Sterile Barrier Technique was utilized including caps, mask, sterile gowns, sterile gloves, sterile drape, hand hygiene and skin antiseptic. A timeout was performed prior to the initiation of the procedure. Preprocedure ultrasound demonstrated moderate to large volume ascites, including in the perihepatic space. The largest fluid pocket visualized in the right lower quadrant. Therefore the right lower quadrant was prepped and draped in standard fashion. The puncture site was planned. Subdermal Local anesthesia was provided with 1% lidocaine with 1:100,000 epinephrine, followed by deeper local anesthetic to the level of the peritoneum under ultrasound guidance. A 5 [place] Yueh needle was advanced into the peritoneal fluid under ultrasound guidance. A total of 3 L of clear, straw-colored fluid was removed during the course of the procedure. The right upper quadrant and indwelling biloma drain were prepped and  draped in standard fashion. Hand injection of contrast via the indwelling biloma drain demonstrated appropriate position within the gallbladder fossa as well as a patent cystic duct. Contrast was visualized within the metallic biliary stent and filled retrograde into the right, nondilated bile ducts. The distal common bile duct stent was occluded. A right posterior duct was identified is in adequate puncture site for biliary drain. A small amount of subdermal Local anesthesia was provided to the right mid axillary line at planned entry site as well as deeper along the hepatic capsule. Under fluoroscopic guidance with multiple obliquities, a 22 gauge Chiba needle was directed toward the opacified right posterior duct. The duct was punctured successfully, confirmed by gentle hand injection of contrast. A Nitrex wire was then directed into the right posterior duct through the metallic common bile duct stent and into the duodenum without difficulty. A 6 French coaxial introducer set was placed over the Nitrex wire. The inner cannula were removed and an Amplatz wire was directed adjacent to the Nitrex wire into the duodenum near the ligament of Treitz. Serial dilation was performed with 8 and 10 [place] dilators. A 10.2 [place] biliary drain with 10 cm of custom cut extra sideholes was then introduced with the pigtail portion coiled in the fourth portion of the duodenum near the ligament of Treitz. Hand injection of contrast demonstrated opacification of the nondilated biliary tree which passed freely into the small bowel. The drain was affixed to the skin with 0 Prolene suture and a Stay Fix dressing. The patient tolerated the procedure well and was returned to the floor in stable condition. IMPRESSION: 1. Occluded indwelling metallic biliary stent at the distal end, presumed secondary to edema related to duodenitis/pancreatitis. 2.  Indwelling biloma drain in satisfactory position. The cystic duct is patent. 3. Successful  placement of an internal external biliary drain via right posterior bile ducts. The distal pigtail portion of the drain is positioned near the ligament of Treitz. 4. Ultrasound-guided paracentesis yielding 3 L of clear, straw-colored fluid. PLAN: Recommend capping biloma drain once any post-procedural hemobilia has resolved. Recommend capping trial of biliary drain 12-24 hours after capping of biloma drain. IR will follow closely. Electronically Signed   By: Ruthann Cancer MD   On: 05/30/2020 08:22    Anti-infectives: Anti-infectives (From admission, onward)   Start     Dose/Rate Route Frequency Ordered Stop   05/29/20 0800  piperacillin-tazobactam (ZOSYN) IVPB 3.375 g        3.375 g 12.5 mL/hr over 240 Minutes Intravenous To Radiology 05/28/20 1328 05/29/20 1943   05/04/20 1230  meropenem (MERREM) 1 g in sodium chloride 0.9 % 100 mL IVPB  Status:  Discontinued        1 g 200 mL/hr over 30 Minutes Intravenous Every 8 hours 05/04/20 1156 05/19/20 0826   05/01/20 1400  piperacillin-tazobactam (ZOSYN) IVPB 3.375 g        3.375 g 12.5 mL/hr over 240 Minutes Intravenous Every 8 hours 05/01/20 1013 05/03/20 0907   04/29/20 2000  vancomycin (VANCOCIN) IVPB 1000 mg/200 mL premix  Status:  Discontinued        1,000 mg 200 mL/hr over 60 Minutes Intravenous Every 12 hours 04/29/20 0653 04/30/20 0831   04/29/20 0745  vancomycin (VANCOREADY) IVPB 1500 mg/300 mL        1,500 mg 150 mL/hr over 120 Minutes Intravenous  Once 04/29/20 0653 04/29/20 1129   04/26/20 2300  piperacillin-tazobactam (ZOSYN) IVPB 3.375 g        3.375 g 12.5 mL/hr over 240 Minutes Intravenous Every 8 hours 04/26/20 1716 05/01/20 0559   04/26/20 1700  piperacillin-tazobactam (ZOSYN) IVPB 3.375 g        3.375 g 100 mL/hr over 30 Minutes Intravenous  Once 04/26/20 1655 04/26/20 1844       Assessment/Plan 64 yo male s/p subtotal cholecystectomy, complicated by persistent bile leak, 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 - Keep both the biliary drain and the percutaneous drain to gravity drainage today. Perc drain is no longer bilious, indicated biliary flow is being adequately diverted via PBD. Unclear what output from perc drain was overnight (nothing charted). If output remains clear and low tomorrow, will plan to internalize the biliary drain. Need accurate recording of drain output. - Remain NPO, have low threshold to replace NG tube given gastric outlet obstruction from pancreatitis. - Optimize nutrition - continue TPN. - Surgery will continue to follow   LOS: 33 days    Michaelle Birks, MD [facility] Surgery General, Hepatobiliary and Pancreatic Surgery 05/30/20 10:00 AM

## 2020-05-30 NOTE — Progress Notes (Signed)
Referring Physician(s): Dr. Zenia Resides  Supervising Physician: Sandi Mariscal  Patient Status:  [facility] - In-pt  Chief Complaint: High-output biliary fistula s/p cholecystectomy s/p biloma drain placement 6/7 Post-ERCP pancreatitis with gastric outlet obstruction S/p common bile duct stent placement, but with continued increased biloma drain output S/p internal/external biliary drain placement 9/21  Subjective: Frustrated this AM.   Meeting in room to discuss NGT placement which patient apparently refused yesterday/overnight.  Both biliary drains in place.   Allergies: Toradol [ketorolac tromethamine]  Medications: Prior to Admission medications   Medication Sig Start Date End Date Taking? Authorizing Provider  Calcium Carb-Cholecalciferol (CALCIUM+D3 PO) Take 1 tablet by mouth daily.   Yes [provider]  Coenzyme Q10 (COQ10) 400 MG CAPS Take 800 mg by mouth in the morning.    Yes [provider]  Cyanocobalamin (VITAMIN B-12) 5000 MCG TBDP Take 5,000 mcg by mouth daily.   Yes [provider]  GLUCOSAMINE-CHONDROITIN PO Take 1 tablet by mouth in the morning and at bedtime.    Yes [provider]  Javier Docker Oil 1000 MG CAPS Take 1,000 mg by mouth in the morning and at bedtime.    Yes [provider]  lisinopril-hydrochlorothiazide (ZESTORETIC) 10-12.5 MG tablet Take 1 tablet by mouth daily. 03/04/20  Yes [provider]  Moringa Oleifera (MORINGA PO) Take 1,000 mg by mouth 2 (two) times daily.   Yes [provider]  Multiple Vitamin (MULTIVITAMIN WITH MINERALS) TABS tablet Take 1 tablet by mouth daily.   Yes [provider]  ondansetron (ZOFRAN) 4 MG tablet Take 1 tablet (4 mg total) by mouth every 8 (eight) hours as needed for nausea or vomiting. 04/25/20  Yes Mansouraty, Telford Nab., MD  OVER THE COUNTER MEDICATION Take 1 tablet by mouth See admin instructions. [facility] otc tablet- Take 1 tablet by mouth once a day    Yes [provider]  oxyCODONE (OXY IR/ROXICODONE) 5 MG immediate release tablet Take 5 mg by mouth 2 (two) times daily as needed (for pain).  04/25/20  Yes [provider]  pravastatin (PRAVACHOL) 80 MG tablet Take 40 mg by mouth in the morning and at bedtime.   Yes [provider]  Red Yeast Rice 600 MG CAPS Take 1,200 mg by mouth at bedtime.    Yes [provider]  sertraline (ZOLOFT) 100 MG tablet Take 100 mg by mouth daily. 11/08/19  Yes [provider]  TURMERIC PO Take 1,500 mg by mouth 2 (two) times daily.   Yes [provider]  zolpidem (AMBIEN) 5 MG tablet Take 5 mg by mouth at bedtime. 12/29/19  Yes [provider]  ibuprofen (ADVIL) 200 MG tablet Take 400-600 mg by mouth every 6 (six) hours as needed for headache or moderate pain.    [provider]     Vital Signs: BP 125/73 (BP Location: Left Arm)   Pulse (!) 103   Temp 98.2 F (36.8 C) (Oral)   Resp 19   Ht 5\' 9"  (1.753 m)   Wt 184 lb 1.4 oz (83.5 kg)   SpO2 95%   BMI 27.18 kg/m   Physical Exam  NAD, alert Abdomen: Anterior drain (biloma drain) in place. Insertion site c/d/i.  Non-bloody, thin, serous-appearing fluid.  Posterior (internal/external biliary drain) in place. Insertion site c/d/i.  Dark, bilious output in collection bag.   Imaging: DG CHEST PORT 1 VIEW  Result Date: 05/27/2020 CLINICAL DATA:  Abdominal pain EXAM: PORTABLE CHEST 1 VIEW COMPARISON:  May 26, 2020, May 24, 2020 FINDINGS: The cardiomediastinal silhouette is unchanged in contour.RIGHT upper extremity PICC tip terminates over the superior cavoatrial junction. Percutaneous cholecystostomy tube. Biliary drain. Small LEFT pleural effusion. No pneumothorax. Persistent LEFT greater than RIGHT basilar heterogeneous opacities. Gaseous dilation of the stomach. No acute osseous abnormality. IMPRESSION: 1. Small LEFT pleural effusion. 2. Persistent LEFT greater than RIGHT basilar  heterogeneous opacities. Differential considerations include atelectasis versus infection. Electronically Signed   By: Valentino Saxon MD   On: 05/27/2020 09:24   DG Abd Portable 1V  Result Date: 05/27/2020 CLINICAL DATA:  Nasogastric tube positioning. EXAM: PORTABLE ABDOMEN - 1 VIEW COMPARISON:  May 23, 2020 FINDINGS: Since the prior study there is been interval placement of a nasogastric tube, with its distal tip overlying the expected region of the body of the stomach. The bowel gas pattern is normal. Stable right-sided percutaneous pigtail catheter positioning is seen. A radiopaque stent is again seen overlying the right upper quadrant. No radio-opaque calculi or other significant radiographic abnormality are seen. IMPRESSION: Nasogastric tube positioning, as described above. Electronically Signed   By: Virgina Norfolk M.D.   On: 05/27/2020 22:04   DG C-Arm 1-60 Min-No Report  Result Date: 05/29/2020 Fluoroscopy was utilized by the requesting physician.  No radiographic interpretation.   IR INT EXT BILIARY DRAIN WITH CHOLANGIOGRAM  Result Date: 05/30/2020 INDICATION: 64 year old male with history of complex postoperative course after subtotal cholecystectomy for acute gangrenous cholecystitis in May 2423 complicated by biloma formation requiring percutaneous drainage on 02/13/2020 and placement of metallic common bile duct stent on 04/26/2020. Most recently, the patient has developed pancreatitis and duodenitis with ERCP findings consistent with obstruction of the distal common bile duct stent and subsequently high output from the biloma drain. Percutaneous cholangiogram and internal external biliary drain placement is requested for decompression and management of biliary fluid losses. EXAM: 1. Ultrasound-guided paracentesis 2. Cholangiogram via indwelling gallbladder fossa drain 3. Percutaneous cholangiogram 4. Placement of right-sided internal external biliary drain MEDICATIONS: Zosyn  3.375 g; The antibiotic was administered within an appropriate time frame prior to the initiation of the procedure. ANESTHESIA/SEDATION: Moderate (conscious) sedation was employed during this procedure. A total of Versed 2 mg and Fentanyl 75 mcg was administered intravenously. Moderate Sedation Time: 55 minutes. The patient's level of consciousness and vital signs were monitored continuously by radiology nursing throughout the procedure under my direct supervision. FLUOROSCOPY TIME:  Fluoroscopy Time: 17 minutes 18 seconds (81 mGy). COMPLICATIONS: None immediate. PROCEDURE: Informed written consent was obtained from the patient after a thorough discussion of the procedural risks, benefits and alternatives. All questions were addressed. Maximal Sterile Barrier Technique was utilized including caps, mask, sterile gowns, sterile gloves, sterile drape, hand hygiene and skin antiseptic. A timeout was performed prior to the initiation of the procedure. Preprocedure ultrasound demonstrated moderate to large volume ascites, including in the perihepatic space. The largest fluid pocket visualized in the right lower quadrant. Therefore the right lower quadrant was prepped and draped in standard fashion. The puncture site was planned. Subdermal Local anesthesia was provided with 1% lidocaine with 1:100,000 epinephrine, followed by deeper local anesthetic to the level of the peritoneum under ultrasound guidance. A 5 [place] Yueh needle was advanced into the peritoneal fluid under ultrasound guidance. A total of 3 L of clear, straw-colored fluid was removed during the course of the procedure. The right upper quadrant and indwelling biloma drain were prepped and draped in standard fashion. Hand injection of contrast via the indwelling biloma drain  demonstrated appropriate position within the gallbladder fossa as well as a patent cystic duct. Contrast was visualized within the metallic biliary stent and filled retrograde into the  right, nondilated bile ducts. The distal common bile duct stent was occluded. A right posterior duct was identified is in adequate puncture site for biliary drain. A small amount of subdermal Local anesthesia was provided to the right mid axillary line at planned entry site as well as deeper along the hepatic capsule. Under fluoroscopic guidance with multiple obliquities, a 22 gauge Chiba needle was directed toward the opacified right posterior duct. The duct was punctured successfully, confirmed by gentle hand injection of contrast. A Nitrex wire was then directed into the right posterior duct through the metallic common bile duct stent and into the duodenum without difficulty. A 6 French coaxial introducer set was placed over the Nitrex wire. The inner cannula were removed and an Amplatz wire was directed adjacent to the Nitrex wire into the duodenum near the ligament of Treitz. Serial dilation was performed with 8 and 10 [place] dilators. A 10.2 [place] biliary drain with 10 cm of custom cut extra sideholes was then introduced with the pigtail portion coiled in the fourth portion of the duodenum near the ligament of Treitz. Hand injection of contrast demonstrated opacification of the nondilated biliary tree which passed freely into the small bowel. The drain was affixed to the skin with 0 Prolene suture and a Stay Fix dressing. The patient tolerated the procedure well and was returned to the floor in stable condition. IMPRESSION: 1. Occluded indwelling metallic biliary stent at the distal end, presumed secondary to edema related to duodenitis/pancreatitis. 2. Indwelling biloma drain in satisfactory position. The cystic duct is patent. 3. Successful placement of an internal external biliary drain via right posterior bile ducts. The distal pigtail portion of the drain is positioned near the ligament of Treitz. 4. Ultrasound-guided paracentesis yielding 3 L of clear, straw-colored fluid. PLAN: Recommend capping biloma  drain once any post-procedural hemobilia has resolved. Recommend capping trial of biliary drain 12-24 hours after capping of biloma drain. IR will follow closely. Electronically Signed   By: Ruthann Cancer MD   On: 05/30/2020 08:22   IR Paracentesis  Result Date: 05/30/2020 INDICATION: 64 year old male with history of complex postoperative course after subtotal cholecystectomy for acute gangrenous cholecystitis in May 2703 complicated by biloma formation requiring percutaneous drainage on 02/13/2020 and placement of metallic common bile duct stent on 04/26/2020. Most recently, the patient has developed pancreatitis and duodenitis with ERCP findings consistent with obstruction of the distal common bile duct stent and subsequently high output from the biloma drain. Percutaneous cholangiogram and internal external biliary drain placement is requested for decompression and management of biliary fluid losses. EXAM: 1. Ultrasound-guided paracentesis 2. Cholangiogram via indwelling gallbladder fossa drain 3. Percutaneous cholangiogram 4. Placement of right-sided internal external biliary drain MEDICATIONS: Zosyn 3.375 g; The antibiotic was administered within an appropriate time frame prior to the initiation of the procedure. ANESTHESIA/SEDATION: Moderate (conscious) sedation was employed during this procedure. A total of Versed 2 mg and Fentanyl 75 mcg was administered intravenously. Moderate Sedation Time: 55 minutes. The patient's level of consciousness and vital signs were monitored continuously by radiology nursing throughout the procedure under my direct supervision. FLUOROSCOPY TIME:  Fluoroscopy Time: 17 minutes 18 seconds (81 mGy). COMPLICATIONS: None immediate. PROCEDURE: Informed written consent was obtained from the patient after a thorough discussion of the procedural risks, benefits and alternatives. All questions were addressed. Maximal Sterile Barrier  Technique was utilized including caps, mask, sterile  gowns, sterile gloves, sterile drape, hand hygiene and skin antiseptic. A timeout was performed prior to the initiation of the procedure. Preprocedure ultrasound demonstrated moderate to large volume ascites, including in the perihepatic space. The largest fluid pocket visualized in the right lower quadrant. Therefore the right lower quadrant was prepped and draped in standard fashion. The puncture site was planned. Subdermal Local anesthesia was provided with 1% lidocaine with 1:100,000 epinephrine, followed by deeper local anesthetic to the level of the peritoneum under ultrasound guidance. A 5 [place] Yueh needle was advanced into the peritoneal fluid under ultrasound guidance. A total of 3 L of clear, straw-colored fluid was removed during the course of the procedure. The right upper quadrant and indwelling biloma drain were prepped and draped in standard fashion. Hand injection of contrast via the indwelling biloma drain demonstrated appropriate position within the gallbladder fossa as well as a patent cystic duct. Contrast was visualized within the metallic biliary stent and filled retrograde into the right, nondilated bile ducts. The distal common bile duct stent was occluded. A right posterior duct was identified is in adequate puncture site for biliary drain. A small amount of subdermal Local anesthesia was provided to the right mid axillary line at planned entry site as well as deeper along the hepatic capsule. Under fluoroscopic guidance with multiple obliquities, a 22 gauge Chiba needle was directed toward the opacified right posterior duct. The duct was punctured successfully, confirmed by gentle hand injection of contrast. A Nitrex wire was then directed into the right posterior duct through the metallic common bile duct stent and into the duodenum without difficulty. A 6 French coaxial introducer set was placed over the Nitrex wire. The inner cannula were removed and an Amplatz wire was directed  adjacent to the Nitrex wire into the duodenum near the ligament of Treitz. Serial dilation was performed with 8 and 10 [place] dilators. A 10.2 [place] biliary drain with 10 cm of custom cut extra sideholes was then introduced with the pigtail portion coiled in the fourth portion of the duodenum near the ligament of Treitz. Hand injection of contrast demonstrated opacification of the nondilated biliary tree which passed freely into the small bowel. The drain was affixed to the skin with 0 Prolene suture and a Stay Fix dressing. The patient tolerated the procedure well and was returned to the floor in stable condition. IMPRESSION: 1. Occluded indwelling metallic biliary stent at the distal end, presumed secondary to edema related to duodenitis/pancreatitis. 2. Indwelling biloma drain in satisfactory position. The cystic duct is patent. 3. Successful placement of an internal external biliary drain via right posterior bile ducts. The distal pigtail portion of the drain is positioned near the ligament of Treitz. 4. Ultrasound-guided paracentesis yielding 3 L of clear, straw-colored fluid. PLAN: Recommend capping biloma drain once any post-procedural hemobilia has resolved. Recommend capping trial of biliary drain 12-24 hours after capping of biloma drain. IR will follow closely. Electronically Signed   By: Ruthann Cancer MD   On: 05/30/2020 08:22    Labs:  CBC: Recent Labs    05/26/20 1630 05/27/20 0355 05/28/20 0350 05/29/20 0841  WBC 29.6* 25.5* 23.3* 23.6*  HGB 10.4* 9.6* 8.8* 8.6*  HCT 34.4* 31.1* 28.6* 29.5*  PLT 320 332 321 304    COAGS: Recent Labs    02/02/20 1902 02/02/20 2115 05/16/20 1905 05/29/20 0404  INR 1.2 1.1 1.3* 1.4*    BMP: Recent Labs    05/27/20 0355  05/28/20 0350 05/29/20 0404 05/30/20 0514  NA 142 142 146* 144  K 4.2 3.8 3.7 3.8  CL 102 103 109 109  CO2 31 28 27 25   GLUCOSE 344* 297* 203* 260*  BUN 37* 41* 39* 43*  CALCIUM 8.4* 8.2* 8.3* 8.2*  CREATININE  0.75 0.82 0.92 0.99  GFRNONAA >60 >60 >60 >60  GFRAA >60 >60 >60 >60    LIVER FUNCTION TESTS: Recent Labs    05/21/20 0418 05/22/20 0424 05/24/20 0405 05/28/20 0350  BILITOT 0.7 0.6 0.7 0.9  AST 17 20 20 28   ALT 18 16 17 29   ALKPHOS 300* 276* 220* 303*  PROT 5.2* 5.4* 5.7* 5.7*  ALBUMIN 1.6* 1.6* 1.6* 1.4*    Assessment and Plan: Bile leak post subtotal cholecystectomy 01/24/20 s/p gall bladder fossa drain placement 02/13/20 due to high-output bile duct remnant despite stent placement by GI 7/14.  ERCP pancreatitis Esophagitis and gastritis found on repeat EGD yesterday (9/21) with GI S/p internal/external biliary drain placement by Dr. Serafina Royals with 3.0 liter paracentesis --noted occlusion of the distal aspect of the biliary stent.  Patient assessed at bedside this AM, however distracted with conversation regarding NGT placement-- and ultimately recounting the extent of his health journey with multiple recent set backs and pain.  Biloma drain with serous/thin output today.  No blood noted. Likely could tolerate capping of this drain, however per surgery note leave to gravity drainage.  Will consider capping of biloma drain tomorrow.  Biliary drain also in place with dark, bilious output.  NGT not yet replaced-- this may be on hold for now.  Monitor symptoms and consider capping trial of I/E drain. CBC not obtained this AM, although ordered.  No fevers after procedures yesterday.   IR following.  Continue current care plan.   Electronically Signed: Docia Barrier, PA 05/30/2020, 1:47 PM   I spent a total of 25 Minutes at the the patient's bedside AND on the patient's hospital floor or unit, greater than 50% of which was counseling/coordinating care for biliary obstruction

## 2020-05-30 NOTE — Plan of Care (Signed)
  Problem: Education: Goal: Knowledge of General Education information will improve Description: Including pain rating scale, medication(s)/side effects and non-pharmacologic comfort measures Outcome: Progressing   Problem: Health Behavior/Discharge Planning: Goal: Ability to manage health-related needs will improve Outcome: Progressing   Problem: Activity: Goal: Risk for activity intolerance will decrease Outcome: Progressing   Problem: Elimination: Goal: Will not experience complications related to bowel motility Outcome: Progressing   Problem: Pain Managment: Goal: General experience of comfort will improve Outcome: Progressing   Problem: Safety: Goal: Ability to remain free from injury will improve Outcome: Progressing   Problem: Skin Integrity: Goal: Risk for impaired skin integrity will decrease Outcome: Progressing   

## 2020-05-31 ENCOUNTER — Inpatient Hospital Stay (HOSPITAL_COMMUNITY): Payer: 59

## 2020-05-31 LAB — COMPREHENSIVE METABOLIC PANEL
ALT: 36 U/L (ref 0–44)
ALT: 33 U/L (ref 0–44)
AST: 25 U/L (ref 15–41)
AST: 32 U/L (ref 15–41)
Albumin: 1.3 g/dL — ABNORMAL LOW (ref 3.5–5.0)
Albumin: 1.7 g/dL — ABNORMAL LOW (ref 3.5–5.0)
Alkaline Phosphatase: 313 U/L — ABNORMAL HIGH (ref 38–126)
Alkaline Phosphatase: 472 U/L — ABNORMAL HIGH (ref 38–126)
Anion gap: 10 (ref 5–15)
Anion gap: 12 (ref 5–15)
BUN: 47 mg/dL — ABNORMAL HIGH (ref 8–23)
BUN: 52 mg/dL — ABNORMAL HIGH (ref 8–23)
CO2: 22 mmol/L (ref 22–32)
CO2: 18 mmol/L — ABNORMAL LOW (ref 22–32)
Calcium: 8.6 mg/dL — ABNORMAL LOW (ref 8.9–10.3)
Calcium: 8.6 mg/dL — ABNORMAL LOW (ref 8.9–10.3)
Chloride: 111 mmol/L (ref 98–111)
Chloride: 115 mmol/L — ABNORMAL HIGH (ref 98–111)
Creatinine, Ser: 1.05 mg/dL (ref 0.61–1.24)
Creatinine, Ser: 1.39 mg/dL — ABNORMAL HIGH (ref 0.61–1.24)
GFR calc Af Amer: >60 mL/min (ref >60)
GFR calc Af Amer: >60 mL/min (ref >60)
GFR calc non Af Amer: >60 mL/min (ref >60)
GFR calc non Af Amer: 53 mL/min — ABNORMAL LOW (ref >60)
Glucose, Bld: 206 mg/dL — ABNORMAL HIGH (ref 70–99)
Glucose, Bld: 69 mg/dL — ABNORMAL LOW (ref 70–99)
Potassium: 4.7 mmol/L (ref 3.5–5.1)
Potassium: 4.2 mmol/L (ref 3.5–5.1)
Sodium: 143 mmol/L (ref 135–145)
Sodium: 145 mmol/L (ref 135–145)
Total Bilirubin: 0.7 mg/dL (ref 0.3–1.2)
Total Bilirubin: 2.4 mg/dL — ABNORMAL HIGH (ref 0.3–1.2)
Total Protein: 5.7 g/dL — ABNORMAL LOW (ref 6.5–8.1)
Total Protein: 5.5 g/dL — ABNORMAL LOW (ref 6.5–8.1)

## 2020-05-31 LAB — BLOOD GAS, ARTERIAL
Acid-base deficit: 7.1 mmol/L — ABNORMAL HIGH (ref 0.0–2.0)
Bicarbonate: 18 mmol/L — ABNORMAL LOW (ref 20.0–28.0)
Drawn by: 535471
FIO2: 100
O2 Saturation: 87.5 %
Patient temperature: 37.6
pCO2 arterial: 38.6 mmHg (ref 32.0–48.0)
pH, Arterial: 7.295 — ABNORMAL LOW (ref 7.350–7.450)
pO2, Arterial: 62 mmHg — ABNORMAL LOW (ref 83.0–108.0)

## 2020-05-31 LAB — CBC
HCT: 28.6 % — ABNORMAL LOW (ref 39.0–52.0)
HCT: 30.8 % — ABNORMAL LOW (ref 39.0–52.0)
Hemoglobin: 8.2 g/dL — ABNORMAL LOW (ref 13.0–17.0)
Hemoglobin: 8.6 g/dL — ABNORMAL LOW (ref 13.0–17.0)
MCH: 26.5 pg (ref 26.0–34.0)
MCH: 26.9 pg (ref 26.0–34.0)
MCHC: 28.7 g/dL — ABNORMAL LOW (ref 30.0–36.0)
MCHC: 27.9 g/dL — ABNORMAL LOW (ref 30.0–36.0)
MCV: 92.6 fL (ref 80.0–100.0)
MCV: 96.3 fL (ref 80.0–100.0)
Platelets: 311 10*3/uL (ref 150–400)
Platelets: 290 10*3/uL (ref 150–400)
RBC: 3.09 MIL/uL — ABNORMAL LOW (ref 4.22–5.81)
RBC: 3.2 MIL/uL — ABNORMAL LOW (ref 4.22–5.81)
RDW: 15.9 % — ABNORMAL HIGH (ref 11.5–15.5)
RDW: 15.9 % — ABNORMAL HIGH (ref 11.5–15.5)
WBC: 26.2 10*3/uL — ABNORMAL HIGH (ref 4.0–10.5)
WBC: 15.6 10*3/uL — ABNORMAL HIGH (ref 4.0–10.5)
nRBC: 0.1 % (ref 0.0–0.2)
nRBC: 1.3 % — ABNORMAL HIGH (ref 0.0–0.2)

## 2020-05-31 LAB — GLUCOSE, CAPILLARY
Glucose-Capillary: 142 mg/dL — ABNORMAL HIGH (ref 70–99)
Glucose-Capillary: 158 mg/dL — ABNORMAL HIGH (ref 70–99)
Glucose-Capillary: 178 mg/dL — ABNORMAL HIGH (ref 70–99)
Glucose-Capillary: 187 mg/dL — ABNORMAL HIGH (ref 70–99)
Glucose-Capillary: 212 mg/dL — ABNORMAL HIGH (ref 70–99)
Glucose-Capillary: 85 mg/dL (ref 70–99)

## 2020-05-31 LAB — PROTIME-INR
INR: 1.5 — ABNORMAL HIGH (ref 0.8–1.2)
Prothrombin Time: 17.7 seconds — ABNORMAL HIGH (ref 11.4–15.2)

## 2020-05-31 LAB — TYPE AND SCREEN
ABO/RH(D): O POS
Antibody Screen: NEGATIVE

## 2020-05-31 LAB — PHOSPHORUS: Phosphorus: 3.4 mg/dL (ref 2.5–4.6)

## 2020-05-31 LAB — LACTIC ACID, PLASMA: Lactic Acid, Venous: 5.3 mmol/L (ref 0.5–1.9)

## 2020-05-31 LAB — MAGNESIUM: Magnesium: 2.4 mg/dL (ref 1.7–2.4)

## 2020-05-31 MED ORDER — ALBUMIN HUMAN 5 % IV SOLN
25.0000 g | Freq: Once | INTRAVENOUS | Status: AC
  Administered 2020-05-31: 25 g via INTRAVENOUS
  Filled 2020-05-31: qty 500

## 2020-05-31 MED ORDER — ALBUMIN HUMAN 25 % IV SOLN: 12.5000 g | Freq: Once | INTRAVENOUS | Status: DC

## 2020-05-31 MED ORDER — FENTANYL CITRATE (PF) 100 MCG/2ML IJ SOLN
50.0000 ug | INTRAMUSCULAR | Status: DC | PRN
  Administered 2020-06-01: 50 ug via INTRAVENOUS
  Administered 2020-06-02 – 2020-06-04 (×9): 100 ug via INTRAVENOUS
  Filled 2020-05-31 (×11): qty 2

## 2020-05-31 MED ORDER — FENTANYL CITRATE (PF) 100 MCG/2ML IJ SOLN
INTRAMUSCULAR | Status: AC
  Administered 2020-05-31: 50 ug
  Filled 2020-05-31: qty 2

## 2020-05-31 MED ORDER — ALBUMIN HUMAN 5 % IV SOLN
INTRAVENOUS | Status: AC
  Filled 2020-05-31: qty 250

## 2020-05-31 MED ORDER — SODIUM CHLORIDE 0.9 % IV BOLUS
1000.0000 mL | Freq: Once | INTRAVENOUS | Status: AC
  Administered 2020-05-31: 1000 mL via INTRAVENOUS

## 2020-05-31 MED ORDER — LACTATED RINGERS IV SOLN: INTRAVENOUS | Status: DC

## 2020-05-31 MED ORDER — TRAVASOL 10 % IV SOLN
INTRAVENOUS | Status: AC
  Filled 2020-05-31: qty 1438.8

## 2020-05-31 MED ORDER — NALOXONE HCL 0.4 MG/ML IJ SOLN: 0.4000 mg | Freq: Once | INTRAMUSCULAR | Status: DC

## 2020-05-31 MED ORDER — NOREPINEPHRINE 4 MG/250ML-% IV SOLN
0.0000 ug/min | INTRAVENOUS | Status: DC
  Administered 2020-05-31: 20 ug/min via INTRAVENOUS
  Administered 2020-05-31: 30 ug/min via INTRAVENOUS
  Administered 2020-05-31: 15 ug/min via INTRAVENOUS
  Administered 2020-05-31: 30 ug/min via INTRAVENOUS
  Administered 2020-05-31: 40 ug/min via INTRAVENOUS
  Administered 2020-06-01: 7 ug/min via INTRAVENOUS
  Administered 2020-06-01: 5 ug/min via INTRAVENOUS
  Administered 2020-06-01: 10 ug/min via INTRAVENOUS
  Administered 2020-06-03: 0 ug/min via INTRAVENOUS
  Filled 2020-05-31 (×6): qty 250

## 2020-05-31 MED ORDER — IOHEXOL 300 MG/ML  SOLN
100.0000 mL | Freq: Once | INTRAMUSCULAR | Status: AC | PRN
  Administered 2020-05-31: 100 mL via INTRAVENOUS

## 2020-05-31 MED FILL — Medication: Qty: 1 | Status: AC

## 2020-05-31 NOTE — Significant Event (Signed)
Rapid Response Event Note   Reason for Call :  Hypotension and abdominal pain  Day 35 of hospital admission with pancreatitis.  Per staff had sudden decompensation  BP 63/41  HR 130s  RR 48, significant pain  Initial Focused Assessment:  Upon my arrival He has received 2L NS, albumin and Levophed is infusing at 15 mcg.  BP 93/56  ST 125 RR 28  O2 sat 98% on NRB.  He is in significant pain.  Lung sounds decreased bases. Shirlee Limerick NP at bedside managing patient's care  Interventions:  Total of 3L NS bolus given via pressure bag. Levophed weaned to 14mcg  BP 129/65 24mcg Fentanly given for pain 12.5 mg phenergan given Radiology at bedside.  Placed capped drain to gravity Patient with some relief of pain post fentanyl but still endorsing pain  1515 A few minutes after NS bolus complete BP dropped to 84/45.  Levophed increased to 36mcg  Transported to radiology for CT abdomen/pelvis Patient became more fatigued and confused.  He occasionally is removing his NRB mask.  O2 sats 90-92% with NRB.    1630  BP 83/51  HR 117  RR 24 O2 sat 91% on NRB Increased Levophed to 103mcg.  Additional NS bolus given Shirlee Limerick NP at bedside to assess patient  Attempted to placed NG tube unsuccessful.    Plan of Care:  Transfer to ICU for continued care   Event Summary:   MD Notified: prior to my arrival Call Time: [place] Time:  [place] Time: [place]  Raliegh Ip, RN

## 2020-05-31 NOTE — Progress Notes (Signed)
PROGRESS NOTE    Adam Fowler  XTG:[id number] DOB: 05-24-1956 DOA: 04/26/2020 PCP: Adam Overly, MD    Brief Narrative:  64 year old male with past medical history notable for essential hypertension who underwent ERCP on 04/25/2020 for biliary stent exchange for persistent postoperative bile leak related to recent cholecystectomy with abscess status post percutaneous drain in which he developed severe epigastric abdominal pain associate with nausea, vomiting.  CT abdomen/pelvis notable for changes consistent with acute pancreatitis without abscess or drainable fluid collection likely related to postoperative ERCP pancreatitis.  Assessment & Plan:   Principal Problem:   Post-ERCP acute pancreatitis Active Problems:   Severe sepsis (HCC)   Polycythemia   AKI (acute kidney injury) ([place])   Metabolic acidosis   Acute respiratory failure with hypoxia (HCC)   Bilateral pleural effusion   Ileus (HCC)   Bile duct leak   Gastric outlet obstruction   Abdominal distention   ARDS (adult respiratory distress syndrome) ([place])   Biloma following surgery   Acute blood loss anemia   Acute post ERCP pancreatitis/ileus Bile leak following cholecystectomy, persistent Patient presenting following increased abdominal pain following ERCP.  Lipase 1379.  CT abdomen/pelvis notable for pancreatitis.  Previous cholecystectomy with bile leak that has persisted despite bile duct stenting.  Gallbladder fossa drain in place placed by IR on 02/13/2020. Repeat CT A//P w/ contrast 9/18 with moderate peripancreatic fluid unchanged, organized fluid likely representing pseudocyst less likely abscess, pigtail drainage catheter in gallbladder fossa, common bile duct stent unchanged, moderate ascites. NG tube pulled out on 05/22/2020, diet advanced to full liquids by GI on 9/15, and not tolerating with return of emesis; replaced NGT to [facility] 9/20. --[facility]/Gen Surgery following, appreciate assistance --Continue TPN  with pharmacy monitoring/dosing --Hepatobiliary surgery recommended IR placement of PTC, placed 9/21 -Appreciate Surgery input. -This afternoon, patient noted to have acute decompensation with hypotension, tachycardia, and hypoxemic failure. STAT CXR and abd films ordered and reviewed, finding of stable infiltrate on CXR. Rapid response at bedside. Despite IVF bolus, SBP progressively declined to the 60's. Critical Care consulted and pt now on pressor support, awaiting transfer to ICU floor.   Coffee-ground emesis EGD with grade C/D esophagitis with active oozing stigmata of recent bleeding, gastritis with hemorrhage.  --Protonix 40 mg IV every 12 hours --N.p.o. and continue TPN --Pt had declined NG placement recently  Acute hypoxic respiratory failure:  Bilateral pleural effusions On 04/28/2020, patient developed severe respiratory distress, rapid response patient was transferred to the ICU with mechanical ventilation.  Etiology volume overload with aspiration pneumonia.  Was successfully extubated on 05/01/2020.  Completed course of antibiotics with vancomycin, Zosyn and meropenem.  Supplemental oxygen has been titrated off and oxygenating well on room air.  CXR 9/16 with stable patchy bilateral airspace disease, left greater than right with suspicion of small left pleural effusion. --Strict I's and O's and daily weights --Continue with supportive care --See above, this afternoon, patient noted to have acute hypoxemic respiratory failure --Critical Care consulted. On NRB when seen at bedside this afternoon prior to transfer to ICU  Acute renal failure: Resolved Patient presenting with creatinine 1.41.  Etiology likely poor oral intake coupled with acute pancreatitis as above.  Patient was treated with IV fluid hydration, now supported with TPN with further advancement of diet.  -Cr up to 1.39 this afternoon. Receiving IVF  Hypokalemia Hypomagnesemia Repleted, currently on TPN.     -Continue to replace as tolerated. K of 4.2 today  Paroxysmal atrial fibrillation, transient: Resolved Spontaneously  converted to normal sinus rhythm, will CHA2DS2-VASc score = 1, low risk of thrombosis.  Telemetry was discontinued.  Chronic pain syndrome: On oxycodone 10 mg every 4 hours as needed for pain at home.  On x-ray 05/25/2020 with mild lumbar degenerative change, no acute abnormality. --Tylenol 650 mg every 6 hours scheduled --Increased OxyContin to 20 mg p.o. q12h on 9/18 --Lidocaine patch --OxyIR, 5 mg q4h prn for moderate breakthrough pain --Dilaudid 1 mg IV q3h prn severe breakthrough pain  Anemia of chronic disease Etiology likely secondary to malnutrition.  --Continue to monitor hemoglobin, transfuse for hemoglobin less than 7.0.  Essential hypertension On lisinopril and HCTZ at home.  --Continue to hold home antihypertensives at this time -Hypotensive this afternoon, now on pressor support  Dyslipidemia: Continue pravastatin as tolerated  Hyperglycemia Hemoglobin A1c 5.6. --Continue insulin therapy per TPN protocol and moderate sliding scale --CBGs Q4h  Moderate calorie protein malnutrition Nutrition Status: Nutrition Problem: Inadequate oral intake Etiology: acute illness Signs/Symptoms: NPO status Interventions: TPN, Ensure Enlive (each supplement provides 350kcal and 20 grams of protein) --Continue TPN, pharmacy consulted for assistance dosing/monitoring --Resume NPO 9/19  Depression/anxiety: Had been on sertraline and trazodone  DVT prophylaxis: Lovenox subq Code Status: Full Family Communication: Pt in room, family not at bedside  Status is: Inpatient  Remains inpatient appropriate because:IV treatments appropriate due to intensity of illness or inability to take PO   Dispo:  Patient From: Home  Planned Disposition: Home  Expected discharge date: 06/04/20  Medically stable for discharge: No   Consultants:   GI  General  Surgery  IR  Procedures:   ERCP 9/9  Antimicrobials: Anti-infectives (From admission, onward)   Start     Dose/Rate Route Frequency Ordered Stop   05/29/20 0800  piperacillin-tazobactam (ZOSYN) IVPB 3.375 g        3.375 g 12.5 mL/hr over 240 Minutes Intravenous To Radiology 05/28/20 1328 05/29/20 1943   05/04/20 1230  meropenem (MERREM) 1 g in sodium chloride 0.9 % 100 mL IVPB  Status:  Discontinued        1 g 200 mL/hr over 30 Minutes Intravenous Every 8 hours 05/04/20 1156 05/19/20 0826   05/01/20 1400  piperacillin-tazobactam (ZOSYN) IVPB 3.375 g        3.375 g 12.5 mL/hr over 240 Minutes Intravenous Every 8 hours 05/01/20 1013 05/03/20 0907   04/29/20 2000  vancomycin (VANCOCIN) IVPB 1000 mg/200 mL premix  Status:  Discontinued        1,000 mg 200 mL/hr over 60 Minutes Intravenous Every 12 hours 04/29/20 0653 04/30/20 0831   04/29/20 0745  vancomycin (VANCOREADY) IVPB 1500 mg/300 mL        1,500 mg 150 mL/hr over 120 Minutes Intravenous  Once 04/29/20 0653 04/29/20 1129   04/26/20 2300  piperacillin-tazobactam (ZOSYN) IVPB 3.375 g        3.375 g 12.5 mL/hr over 240 Minutes Intravenous Every 8 hours 04/26/20 1716 05/01/20 0559   04/26/20 1700  piperacillin-tazobactam (ZOSYN) IVPB 3.375 g        3.375 g 100 mL/hr over 30 Minutes Intravenous  Once 04/26/20 1655 04/26/20 1844      Subjective: When seen, on NRB, appears uncomfortable, unable to communicate as a result  Objective: Vitals:   05/31/20 1200 05/31/20 1320 05/31/20 1322 05/31/20 1326  BP:  (!) 66/46 (!) 78/53 (!) 63/41  Pulse:  (!) 140 (!) 136 (!) 134  Resp:      Temp: (!) 97.1 F (36.2 C)  TempSrc: Axillary     SpO2:  98%    Weight:      Height:        Intake/Output Summary (Last 24 hours) at 05/31/2020 1342 Last data filed at 05/31/2020 0900 Gross per 24 hour  Intake 1231.35 ml  Output 1340 ml  Net -108.65 ml   Filed Weights   05/24/20 0500 05/26/20 0500 05/28/20 0500  Weight: 86.3 kg 84.6 kg  83.5 kg    Examination: General exam: Awake, laying in bed, appears uncomfortable, sob Respiratory system: Increased resp effort, no audible wheezing Cardiovascular system: tachycardic, s1, s2 Gastrointestinal system: distended, decreased BS Central nervous system: CN2-12 grossly intact, strength intact Extremities: Perfused, no clubbing Skin: Normal skin turgor, no notable skin lesions seen Psychiatry: unable to assess given current acute medical condition  Data Reviewed: I have personally reviewed following labs and imaging studies  CBC: Recent Labs  Lab 05/26/20 1630 05/27/20 0355 05/28/20 0350 05/29/20 0841 05/31/20 0458  WBC 29.6* 25.5* 23.3* 23.6* 26.2*  NEUTROABS  --   --  19.0*  --   --   HGB 10.4* 9.6* 8.8* 8.6* 8.2*  HCT 34.4* 31.1* 28.6* 29.5* 28.6*  MCV 90.5 90.4 90.2 91.9 92.6  PLT 320 332 321 304 038   Basic Metabolic Panel: Recent Labs  Lab 05/25/20 0359 05/25/20 0359 05/26/20 0349 05/26/20 0349 05/27/20 0355 05/28/20 0350 05/29/20 0404 05/30/20 0514 05/31/20 0458  NA 136   < > 141   < > 142 142 146* 144 143  K 4.3   < > 4.1   < > 4.2 3.8 3.7 3.8 4.7  CL 103   < > 104   < > 102 103 109 109 111  CO2 24   < > 28   < > 31 28 27 25 22   GLUCOSE 186*   < > 320*   < > 344* 297* 203* 260* 206*  BUN 20   < > 28*   < > 37* 41* 39* 43* 47*  CREATININE 0.58*   < > 0.61   < > 0.75 0.82 0.92 0.99 1.05  CALCIUM 8.6*   < > 8.3*   < > 8.4* 8.2* 8.3* 8.2* 8.6*  MG 2.1  --  2.1  --  2.1 2.1  --   --  2.4  PHOS 3.9  --  3.6  --  3.9 3.2  --   --  3.4   < > = values in this interval not displayed.   GFR: Estimated Creatinine Clearance: 71.1 mL/min (by C-G formula based on SCr of 1.05 mg/dL). Liver Function Tests: Recent Labs  Lab 05/28/20 0350 05/31/20 0458  AST 28 25  ALT 29 36  ALKPHOS 303* 313*  BILITOT 0.9 0.7  PROT 5.7* 5.7*  ALBUMIN 1.4* 1.3*   No results for input(s): LIPASE, AMYLASE in the last 168 hours. No results for input(s): AMMONIA in the  last 168 hours. Coagulation Profile: Recent Labs  Lab 05/29/20 0404  INR 1.4*   Cardiac Enzymes: No results for input(s): CKTOTAL, CKMB, CKMBINDEX, TROPONINI in the last 168 hours. BNP (last 3 results) No results for input(s): PROBNP in the last 8760 hours. HbA1C: No results for input(s): HGBA1C in the last 72 hours. CBG: Recent Labs  Lab 05/30/20 2030 05/31/20 0014 05/31/20 0436 05/31/20 0756 05/31/20 1142  GLUCAP 232* 212* 178* 142* 158*   Lipid Profile: No results for input(s): CHOL, HDL, LDLCALC, TRIG, CHOLHDL, LDLDIRECT in the last 72 hours. Thyroid Function  Tests: No results for input(s): TSH, T4TOTAL, FREET4, T3FREE, THYROIDAB in the last 72 hours. Anemia Panel: No results for input(s): VITAMINB12, FOLATE, FERRITIN, TIBC, IRON, RETICCTPCT in the last 72 hours. Sepsis Labs: No results for input(s): PROCALCITON, LATICACIDVEN in the last 168 hours.  No results found for this or any previous visit (from the past 240 hour(s)).   Radiology Studies: IR CHOLANGIOGRAM EXISTING TUBE  Result Date: 05/30/2020 Narrative & Impression INDICATION: 64 year old male with history of complex postoperative course after subtotal cholecystectomy for acute gangrenous cholecystitis in May 3007 complicated by biloma formation requiring percutaneous drainage on 02/13/2020 and placement of metallic common bile duct stent on 04/26/2020. Most recently, the patient has developed pancreatitis and duodenitis with ERCP findings consistent with obstruction of the distal common bile duct stent and subsequently high output from the biloma drain. Percutaneous cholangiogram and internal external biliary drain placement is requested for decompression and management of biliary fluid losses.  EXAM: 1. Ultrasound-guided paracentesis 2. Cholangiogram via indwelling gallbladder fossa drain 3. Percutaneous cholangiogram 4. Placement of right-sided internal external biliary drain  MEDICATIONS: Zosyn 3.375 g; The  antibiotic was administered within an appropriate time frame prior to the initiation of the procedure.  ANESTHESIA/SEDATION: Moderate (conscious) sedation was employed during this procedure. A total of Versed 2 mg and Fentanyl 75 mcg was administered intravenously.  Moderate Sedation Time: 55 minutes. The patient's level of consciousness and vital signs were monitored continuously by radiology nursing throughout the procedure under my direct supervision.  FLUOROSCOPY TIME:  Fluoroscopy Time: 17 minutes 18 seconds (81 mGy).  COMPLICATIONS: None immediate.  PROCEDURE: Informed written consent was obtained from the patient after a thorough discussion of the procedural risks, benefits and alternatives. All questions were addressed. Maximal Sterile Barrier Technique was utilized including caps, mask, sterile gowns, sterile gloves, sterile drape, hand hygiene and skin antiseptic. A timeout was performed prior to the initiation of the procedure.  Preprocedure ultrasound demonstrated moderate to large volume ascites, including in the perihepatic space. The largest fluid pocket visualized in the right lower quadrant. Therefore the right lower quadrant was prepped and draped in standard fashion. The puncture site was planned. Subdermal Local anesthesia was provided with 1% lidocaine with 1:100,000 epinephrine, followed by deeper local anesthetic to the level of the peritoneum under ultrasound guidance. A 5 [place] Yueh needle was advanced into the peritoneal fluid under ultrasound guidance. A total of 3 L of clear, straw-colored fluid was removed during the course of the procedure.  The right upper quadrant and indwelling biloma drain were prepped and draped in standard fashion. Hand injection of contrast via the indwelling biloma drain demonstrated appropriate position within the gallbladder fossa as well as a patent cystic duct. Contrast was visualized within the metallic biliary stent and filled retrograde into the  right, nondilated bile ducts. The distal common bile duct stent was occluded. A right posterior duct was identified is in adequate puncture site for biliary drain. A small amount of subdermal Local anesthesia was provided to the right mid axillary line at planned entry site as well as deeper along the hepatic capsule. Under fluoroscopic guidance with multiple obliquities, a 22 gauge Chiba needle was directed toward the opacified right posterior duct. The duct was punctured successfully, confirmed by gentle hand injection of contrast. A Nitrex wire was then directed into the right posterior duct through the metallic common bile duct stent and into the duodenum without difficulty. A 6 French coaxial introducer set was placed over the Nitrex wire. The inner  cannula were removed and an Amplatz wire was directed adjacent to the Nitrex wire into the duodenum near the ligament of Treitz. Serial dilation was performed with 8 and 10 [place] dilators. A 10.2 [place] biliary drain with 10 cm of custom cut extra sideholes was then introduced with the pigtail portion coiled in the fourth portion of the duodenum near the ligament of Treitz. Hand injection of contrast demonstrated opacification of the nondilated biliary tree which passed freely into the small bowel. The drain was affixed to the skin with 0 Prolene suture and a Stay Fix dressing.  The patient tolerated the procedure well and was returned to the floor in stable condition.  IMPRESSION: 1. Occluded indwelling metallic biliary stent at the distal end, presumed secondary to edema related to duodenitis/pancreatitis. 2. Indwelling biloma drain in satisfactory position. The cystic duct is patent. 3. Successful placement of an internal external biliary drain via right posterior bile ducts. The distal pigtail portion of the drain is positioned near the ligament of Treitz. 4. Ultrasound-guided paracentesis yielding 3 L of clear, straw-colored fluid.  PLAN:  Recommend  capping biloma drain once any post-procedural hemobilia has resolved. Recommend capping trial of biliary drain 12-24 hours after capping of biloma drain. IR will follow closely.   Electronically Signed   By: Ruthann Cancer MD   On: 05/30/2020 08:22  Result History   IR INT EXT BILIARY DRAIN WITH CHOLANGIOGRAM  Result Date: 05/30/2020 INDICATION: 64 year old male with history of complex postoperative course after subtotal cholecystectomy for acute gangrenous cholecystitis in May 6629 complicated by biloma formation requiring percutaneous drainage on 02/13/2020 and placement of metallic common bile duct stent on 04/26/2020. Most recently, the patient has developed pancreatitis and duodenitis with ERCP findings consistent with obstruction of the distal common bile duct stent and subsequently high output from the biloma drain. Percutaneous cholangiogram and internal external biliary drain placement is requested for decompression and management of biliary fluid losses. EXAM: 1. Ultrasound-guided paracentesis 2. Cholangiogram via indwelling gallbladder fossa drain 3. Percutaneous cholangiogram 4. Placement of right-sided internal external biliary drain MEDICATIONS: Zosyn 3.375 g; The antibiotic was administered within an appropriate time frame prior to the initiation of the procedure. ANESTHESIA/SEDATION: Moderate (conscious) sedation was employed during this procedure. A total of Versed 2 mg and Fentanyl 75 mcg was administered intravenously. Moderate Sedation Time: 55 minutes. The patient's level of consciousness and vital signs were monitored continuously by radiology nursing throughout the procedure under my direct supervision. FLUOROSCOPY TIME:  Fluoroscopy Time: 17 minutes 18 seconds (81 mGy). COMPLICATIONS: None immediate. PROCEDURE: Informed written consent was obtained from the patient after a thorough discussion of the procedural risks, benefits and alternatives. All questions were addressed. Maximal  Sterile Barrier Technique was utilized including caps, mask, sterile gowns, sterile gloves, sterile drape, hand hygiene and skin antiseptic. A timeout was performed prior to the initiation of the procedure. Preprocedure ultrasound demonstrated moderate to large volume ascites, including in the perihepatic space. The largest fluid pocket visualized in the right lower quadrant. Therefore the right lower quadrant was prepped and draped in standard fashion. The puncture site was planned. Subdermal Local anesthesia was provided with 1% lidocaine with 1:100,000 epinephrine, followed by deeper local anesthetic to the level of the peritoneum under ultrasound guidance. A 5 [place] Yueh needle was advanced into the peritoneal fluid under ultrasound guidance. A total of 3 L of clear, straw-colored fluid was removed during the course of the procedure. The right upper quadrant and indwelling biloma drain were prepped and  draped in standard fashion. Hand injection of contrast via the indwelling biloma drain demonstrated appropriate position within the gallbladder fossa as well as a patent cystic duct. Contrast was visualized within the metallic biliary stent and filled retrograde into the right, nondilated bile ducts. The distal common bile duct stent was occluded. A right posterior duct was identified is in adequate puncture site for biliary drain. A small amount of subdermal Local anesthesia was provided to the right mid axillary line at planned entry site as well as deeper along the hepatic capsule. Under fluoroscopic guidance with multiple obliquities, a 22 gauge Chiba needle was directed toward the opacified right posterior duct. The duct was punctured successfully, confirmed by gentle hand injection of contrast. A Nitrex wire was then directed into the right posterior duct through the metallic common bile duct stent and into the duodenum without difficulty. A 6 French coaxial introducer set was placed over the Nitrex wire.  The inner cannula were removed and an Amplatz wire was directed adjacent to the Nitrex wire into the duodenum near the ligament of Treitz. Serial dilation was performed with 8 and 10 [place] dilators. A 10.2 [place] biliary drain with 10 cm of custom cut extra sideholes was then introduced with the pigtail portion coiled in the fourth portion of the duodenum near the ligament of Treitz. Hand injection of contrast demonstrated opacification of the nondilated biliary tree which passed freely into the small bowel. The drain was affixed to the skin with 0 Prolene suture and a Stay Fix dressing. The patient tolerated the procedure well and was returned to the floor in stable condition. IMPRESSION: 1. Occluded indwelling metallic biliary stent at the distal end, presumed secondary to edema related to duodenitis/pancreatitis. 2. Indwelling biloma drain in satisfactory position. The cystic duct is patent. 3. Successful placement of an internal external biliary drain via right posterior bile ducts. The distal pigtail portion of the drain is positioned near the ligament of Treitz. 4. Ultrasound-guided paracentesis yielding 3 L of clear, straw-colored fluid. PLAN: Recommend capping biloma drain once any post-procedural hemobilia has resolved. Recommend capping trial of biliary drain 12-24 hours after capping of biloma drain. IR will follow closely. Electronically Signed   By: Ruthann Cancer MD   On: 05/30/2020 08:22   IR Paracentesis  Result Date: 05/30/2020 INDICATION: 64 year old male with history of complex postoperative course after subtotal cholecystectomy for acute gangrenous cholecystitis in May 2025 complicated by biloma formation requiring percutaneous drainage on 02/13/2020 and placement of metallic common bile duct stent on 04/26/2020. Most recently, the patient has developed pancreatitis and duodenitis with ERCP findings consistent with obstruction of the distal common bile duct stent and subsequently high output  from the biloma drain. Percutaneous cholangiogram and internal external biliary drain placement is requested for decompression and management of biliary fluid losses. EXAM: 1. Ultrasound-guided paracentesis 2. Cholangiogram via indwelling gallbladder fossa drain 3. Percutaneous cholangiogram 4. Placement of right-sided internal external biliary drain MEDICATIONS: Zosyn 3.375 g; The antibiotic was administered within an appropriate time frame prior to the initiation of the procedure. ANESTHESIA/SEDATION: Moderate (conscious) sedation was employed during this procedure. A total of Versed 2 mg and Fentanyl 75 mcg was administered intravenously. Moderate Sedation Time: 55 minutes. The patient's level of consciousness and vital signs were monitored continuously by radiology nursing throughout the procedure under my direct supervision. FLUOROSCOPY TIME:  Fluoroscopy Time: 17 minutes 18 seconds (81 mGy). COMPLICATIONS: None immediate. PROCEDURE: Informed written consent was obtained from the patient after a thorough discussion of  the procedural risks, benefits and alternatives. All questions were addressed. Maximal Sterile Barrier Technique was utilized including caps, mask, sterile gowns, sterile gloves, sterile drape, hand hygiene and skin antiseptic. A timeout was performed prior to the initiation of the procedure. Preprocedure ultrasound demonstrated moderate to large volume ascites, including in the perihepatic space. The largest fluid pocket visualized in the right lower quadrant. Therefore the right lower quadrant was prepped and draped in standard fashion. The puncture site was planned. Subdermal Local anesthesia was provided with 1% lidocaine with 1:100,000 epinephrine, followed by deeper local anesthetic to the level of the peritoneum under ultrasound guidance. A 5 [place] Yueh needle was advanced into the peritoneal fluid under ultrasound guidance. A total of 3 L of clear, straw-colored fluid was removed during  the course of the procedure. The right upper quadrant and indwelling biloma drain were prepped and draped in standard fashion. Hand injection of contrast via the indwelling biloma drain demonstrated appropriate position within the gallbladder fossa as well as a patent cystic duct. Contrast was visualized within the metallic biliary stent and filled retrograde into the right, nondilated bile ducts. The distal common bile duct stent was occluded. A right posterior duct was identified is in adequate puncture site for biliary drain. A small amount of subdermal Local anesthesia was provided to the right mid axillary line at planned entry site as well as deeper along the hepatic capsule. Under fluoroscopic guidance with multiple obliquities, a 22 gauge Chiba needle was directed toward the opacified right posterior duct. The duct was punctured successfully, confirmed by gentle hand injection of contrast. A Nitrex wire was then directed into the right posterior duct through the metallic common bile duct stent and into the duodenum without difficulty. A 6 French coaxial introducer set was placed over the Nitrex wire. The inner cannula were removed and an Amplatz wire was directed adjacent to the Nitrex wire into the duodenum near the ligament of Treitz. Serial dilation was performed with 8 and 10 [place] dilators. A 10.2 [place] biliary drain with 10 cm of custom cut extra sideholes was then introduced with the pigtail portion coiled in the fourth portion of the duodenum near the ligament of Treitz. Hand injection of contrast demonstrated opacification of the nondilated biliary tree which passed freely into the small bowel. The drain was affixed to the skin with 0 Prolene suture and a Stay Fix dressing. The patient tolerated the procedure well and was returned to the floor in stable condition. IMPRESSION: 1. Occluded indwelling metallic biliary stent at the distal end, presumed secondary to edema related to  duodenitis/pancreatitis. 2. Indwelling biloma drain in satisfactory position. The cystic duct is patent. 3. Successful placement of an internal external biliary drain via right posterior bile ducts. The distal pigtail portion of the drain is positioned near the ligament of Treitz. 4. Ultrasound-guided paracentesis yielding 3 L of clear, straw-colored fluid. PLAN: Recommend capping biloma drain once any post-procedural hemobilia has resolved. Recommend capping trial of biliary drain 12-24 hours after capping of biloma drain. IR will follow closely. Electronically Signed   By: Ruthann Cancer MD   On: 05/30/2020 08:22    Scheduled Meds: . acetaminophen  650 mg Oral Q6H  . acidophilus  2 capsule Oral Daily  . Chlorhexidine Gluconate Cloth  6 each Topical Daily  . enoxaparin (LOVENOX) injection  40 mg Subcutaneous Q24H  . influenza vac split quadrivalent PF  0.5 mL Intramuscular Tomorrow-1000  . insulin aspart  0-20 Units Subcutaneous Q4H  . mouth  rinse  15 mL Mouth Rinse BID  . naLOXone (NARCAN)  injection  0.4 mg Intravenous Once  . oxyCODONE  20 mg Oral Q12H  . pantoprazole (PROTONIX) IV  40 mg Intravenous Q12H  . polyethylene glycol  17 g Oral Daily  . pravastatin  20 mg Oral q1800  . sertraline  100 mg Oral Daily  . sodium chloride flush  10-40 mL Intracatheter Q12H  . sodium chloride flush  5 mL Intracatheter Q8H  . traZODone  50 mg Oral QHS   Continuous Infusions: . sodium chloride Stopped (05/07/20 0923)  . albumin human    . TPN CYCLIC-ADULT (ION)       LOS: 34 days   Marylu Lund, MD [facility] Pager On Amion  If 7PM-7AM, please contact night-coverage 05/31/2020, 1:42 PM

## 2020-05-31 NOTE — Progress Notes (Signed)
PHARMACY - TOTAL PARENTERAL NUTRITION CONSULT NOTE  Indication:  Severe pancreatitis, biliary fistula  Patient Measurements: Height: _0  (175.3 cm) Weight: 83.5 kg (184 lb 1.4 oz) IBW/kg (Calculated) : 70.7 TPN AdjBW (KG): 86.1 Body mass index is 27.18 kg/m.  Assessment:  88 YOM admitted 04/26/20, post-ERCP 8/18 and biliary stent exchange for persistent postoperative bile leak related to recent cholecystectomy with abscess s/p percutaneous drainage. Patient developed acute pancreatitis, has had a prolonged hospitalization complicated by VDRF (extubated 8/24), AKI, encephalopathy, and afib. TPN initiated 9/11 for acute post-ERCP pancreatitis/ileus and persistent biliary leak. Significant gastric fluid retention from functional outlet obstruction. Pt still with very poor PO intake.   Glucose / Insulin: A1c 5.6% - CBGs 200s. 18 units mSSI/24 hrs, 30 units regular insulin in TPN. CBG currently controlled d/t TPN draining into floor this AM, per RN.  Electrolytes: CoCa 10.7 high. Mag 2.4 high normal. others WNL Renal: SCr 1.05 (up), BUN 47 (up).  LFTs / TGs: LFTs/Tbili WNL. Alk phos 313 (up), TG 162 Prealbumin / albumin: Prealbumin 8.2 (up). Albumin 1.3 (down) Intake / Output; MIVF: UOP 0.63m/kg/hr. LBM 9/20. Drain 6179m24hrs. I/O -308.98m84m4hrs GI Imaging:  9/5: CT - severe pancreatitis 9/7: HIDA - persistent bile leak with accumulations/paracolic gutter 9/9: CT - pancreatitis (peripancreatic fat stranding and peripancreatic fluid, percutaneous drain at the gallbladder fossa, unchanged biliary stent with no intrahepatic biliary duct dilatation, bilateral pleural effusions 9/15: KUB - diffuse colonic stool, no bowel obstruction/free air 9/18: CT abd/pelvis - mild colonic diverticulosis, peripancreatic fluid indicative of pseudocyst involvement and less likely abscess, otherwise unchanged Surgeries / Procedures: 8/18: ERCP/biliary stent exchange 9/9: Unsuccessful ERCP 9/10: Paracentesis -  1.4L out 9/21: EGD- Esophagitis w/ active oozing and stigmata of recent bleeding, gastritis w/ hemorrhage, congested duodenal mucosa; NGT trauma  9/21: Paracentesis/biliary drain placement/cholangiogram/drain check- 3L removed  Central access: PICC placed 05/19/20 TPN start date: 05/19/20  Nutritional Goals (per RD rec on 05/18/20): kCal: 2300-2500, Protein: 130-145g, Fluid: >2.3L/day  Current Nutrition: TPN  Plan:  Continue 18 hour cyclic TPN at [id number] 2398 ml over 18 hrs: 71 ml/hr x 1, then 141 ml/hr x 16 hrs, then 71 ml/hr x 1 hr TPN will provide 144g AA, 312g CHO and 72g ILE for a total of 2355 kCal, meeting 100% of needs  Electrolytes in TPN: Decrease K to 37m62m, decrease Mag to 5mEq71m and continue Na 60mEq68mPhos 4mmol/5mnd Ca 0mEq/L.30m:Ac 1:2. Add standard MVI and trace elements to TPN  Continue rSSI q4h + increase to 40 units insulin in TPN  F/u AM labs, CBGs and tolerance to 18-hr cy18-EUTPN  Earma Nicolaou RiRedgie Grayer Candidate  05/31/2020 7:27 AM

## 2020-05-31 NOTE — Progress Notes (Signed)
Interventional Radiology Brief Note:  IR following closely after acute change in status today.  Tbili increased from 0.7  2.4 Scr up to 1.39 WBC 15.9 Lactic acid pending. Afebrile.  Hypotensive requiring pressors.   CT Abdomen Pelvis reviewed with Dr. Serafina Royals. Notable findings include acute, stable pancreatitis.  Gastric distention with worsening left lower lobe collapse with concern for aspiration vs. Infiltration.  His ascites has recurred even after 3L removed during procedure 9/21.  SBP could be considered, although cholestatic cholangitis from capping trial more likely.   Surgery has recommended both drains to gravity drainage, NGT for decompression and reduced risk of aspiration.  Patient has previously been refusing NGT replacement.   IR following.   Brynda Greathouse, MS RD PA-C 4:48 PM

## 2020-05-31 NOTE — Progress Notes (Signed)
Subjective: Very minimal output from percutaneous gallbladder drain recorded overnight, output is serous this morning. Serum bilirubin remains normal. WBC remains elevated, patient is afebrile. Patient reports some nausea.  Objective: Vital signs in last 24 hours: Temp:  [98.4 F (36.9 C)-100.4 F (38 C)] 99.6 F (37.6 C) (09/23 0756) Pulse Rate:  [98-109] 98 (09/23 0756) Resp:  [14-18] 17 (09/23 0756) BP: (115-124)/(63-80) 115/75 (09/23 0756) SpO2:  [90 %-95 %] 90 % (09/23 0756) Last BM Date: 05/28/20  Intake/Output from previous day: 09/22 0701 - 09/23 0700 In: 1231.4 [I.V.:1231.4] Out: 1540 [Urine:925; Drains:615] Intake/Output this shift: No intake/output data recorded.  PE: General: resting comfortably, NAD Neuro: alert and oriented Resp: normal work of breathing Abdomen: mildly distended but soft, minimally tender RUQ biliary drain to gravity with bile in bag. RUQ gallbladder fossa drain with scant serous fluid.  Lab Results:  Recent Labs    05/29/20 0841 05/31/20 0458  WBC 23.6* 26.2*  HGB 8.6* 8.2*  HCT 29.5* 28.6*  PLT 304 311   BMET Recent Labs    05/30/20 0514 05/31/20 0458  NA 144 143  K 3.8 4.7  CL 109 111  CO2 25 22  GLUCOSE 260* 206*  BUN 43* 47*  CREATININE 0.99 1.05  CALCIUM 8.2* 8.6*   PT/INR Recent Labs    05/29/20 0404  LABPROT 16.4*  INR 1.4*   CMP     Component Value Date/Time   NA 143 05/31/2020 0458   K 4.7 05/31/2020 0458   CL 111 05/31/2020 0458   CO2 22 05/31/2020 0458   GLUCOSE 206 (H) 05/31/2020 0458   BUN 47 (H) 05/31/2020 0458   CREATININE 1.05 05/31/2020 0458   CALCIUM 8.6 (L) 05/31/2020 0458   PROT 5.7 (L) 05/31/2020 0458   ALBUMIN 1.3 (L) 05/31/2020 0458   AST 25 05/31/2020 0458   ALT 36 05/31/2020 0458   ALKPHOS 313 (H) 05/31/2020 0458   BILITOT 0.7 05/31/2020 0458   GFRNONAA >60 05/31/2020 0458   GFRAA >60 05/31/2020 0458   Lipase     Component Value Date/Time   LIPASE 27 05/16/2020 1905        Studies/Results: DG C-Arm 1-60 Min-No Report  Result Date: 05/29/2020 Fluoroscopy was utilized by the requesting physician.  No radiographic interpretation.   IR CHOLANGIOGRAM EXISTING TUBE  Result Date: 05/30/2020 Narrative & Impression INDICATION: 64 year old male with history of complex postoperative course after subtotal cholecystectomy for acute gangrenous cholecystitis in May 5809 complicated by biloma formation requiring percutaneous drainage on 02/13/2020 and placement of metallic common bile duct stent on 04/26/2020. Most recently, the patient has developed pancreatitis and duodenitis with ERCP findings consistent with obstruction of the distal common bile duct stent and subsequently high output from the biloma drain. Percutaneous cholangiogram and internal external biliary drain placement is requested for decompression and management of biliary fluid losses.  EXAM: 1. Ultrasound-guided paracentesis 2. Cholangiogram via indwelling gallbladder fossa drain 3. Percutaneous cholangiogram 4. Placement of right-sided internal external biliary drain  MEDICATIONS: Zosyn 3.375 g; The antibiotic was administered within an appropriate time frame prior to the initiation of the procedure.  ANESTHESIA/SEDATION: Moderate (conscious) sedation was employed during this procedure. A total of Versed 2 mg and Fentanyl 75 mcg was administered intravenously.  Moderate Sedation Time: 55 minutes. The patient's level of consciousness and vital signs were monitored continuously by radiology nursing throughout the procedure under my direct supervision.  FLUOROSCOPY TIME:  Fluoroscopy Time: 17 minutes 18 seconds (81 mGy).  COMPLICATIONS:  None immediate.  PROCEDURE: Informed written consent was obtained from the patient after a thorough discussion of the procedural risks, benefits and alternatives. All questions were addressed. Maximal Sterile Barrier Technique was utilized including caps, mask, sterile gowns,  sterile gloves, sterile drape, hand hygiene and skin antiseptic. A timeout was performed prior to the initiation of the procedure.  Preprocedure ultrasound demonstrated moderate to large volume ascites, including in the perihepatic space. The largest fluid pocket visualized in the right lower quadrant. Therefore the right lower quadrant was prepped and draped in standard fashion. The puncture site was planned. Subdermal Local anesthesia was provided with 1% lidocaine with 1:100,000 epinephrine, followed by deeper local anesthetic to the level of the peritoneum under ultrasound guidance. A 5 [place] Yueh needle was advanced into the peritoneal fluid under ultrasound guidance. A total of 3 L of clear, straw-colored fluid was removed during the course of the procedure.  The right upper quadrant and indwelling biloma drain were prepped and draped in standard fashion. Hand injection of contrast via the indwelling biloma drain demonstrated appropriate position within the gallbladder fossa as well as a patent cystic duct. Contrast was visualized within the metallic biliary stent and filled retrograde into the right, nondilated bile ducts. The distal common bile duct stent was occluded. A right posterior duct was identified is in adequate puncture site for biliary drain. A small amount of subdermal Local anesthesia was provided to the right mid axillary line at planned entry site as well as deeper along the hepatic capsule. Under fluoroscopic guidance with multiple obliquities, a 22 gauge Chiba needle was directed toward the opacified right posterior duct. The duct was punctured successfully, confirmed by gentle hand injection of contrast. A Nitrex wire was then directed into the right posterior duct through the metallic common bile duct stent and into the duodenum without difficulty. A 6 French coaxial introducer set was placed over the Nitrex wire. The inner cannula were removed and an Amplatz wire was directed adjacent  to the Nitrex wire into the duodenum near the ligament of Treitz. Serial dilation was performed with 8 and 10 [place] dilators. A 10.2 [place] biliary drain with 10 cm of custom cut extra sideholes was then introduced with the pigtail portion coiled in the fourth portion of the duodenum near the ligament of Treitz. Hand injection of contrast demonstrated opacification of the nondilated biliary tree which passed freely into the small bowel. The drain was affixed to the skin with 0 Prolene suture and a Stay Fix dressing.  The patient tolerated the procedure well and was returned to the floor in stable condition.  IMPRESSION: 1. Occluded indwelling metallic biliary stent at the distal end, presumed secondary to edema related to duodenitis/pancreatitis. 2. Indwelling biloma drain in satisfactory position. The cystic duct is patent. 3. Successful placement of an internal external biliary drain via right posterior bile ducts. The distal pigtail portion of the drain is positioned near the ligament of Treitz. 4. Ultrasound-guided paracentesis yielding 3 L of clear, straw-colored fluid.  PLAN:  Recommend capping biloma drain once any post-procedural hemobilia has resolved. Recommend capping trial of biliary drain 12-24 hours after capping of biloma drain. IR will follow closely.   Electronically Signed   By: Ruthann Cancer MD   On: 05/30/2020 08:22  Result History   IR INT EXT BILIARY DRAIN WITH CHOLANGIOGRAM  Result Date: 05/30/2020 INDICATION: 64 year old male with history of complex postoperative course after subtotal cholecystectomy for acute gangrenous cholecystitis in May 3016 complicated by biloma formation  requiring percutaneous drainage on 02/13/2020 and placement of metallic common bile duct stent on 04/26/2020. Most recently, the patient has developed pancreatitis and duodenitis with ERCP findings consistent with obstruction of the distal common bile duct stent and subsequently high output from the biloma  drain. Percutaneous cholangiogram and internal external biliary drain placement is requested for decompression and management of biliary fluid losses. EXAM: 1. Ultrasound-guided paracentesis 2. Cholangiogram via indwelling gallbladder fossa drain 3. Percutaneous cholangiogram 4. Placement of right-sided internal external biliary drain MEDICATIONS: Zosyn 3.375 g; The antibiotic was administered within an appropriate time frame prior to the initiation of the procedure. ANESTHESIA/SEDATION: Moderate (conscious) sedation was employed during this procedure. A total of Versed 2 mg and Fentanyl 75 mcg was administered intravenously. Moderate Sedation Time: 55 minutes. The patient's level of consciousness and vital signs were monitored continuously by radiology nursing throughout the procedure under my direct supervision. FLUOROSCOPY TIME:  Fluoroscopy Time: 17 minutes 18 seconds (81 mGy). COMPLICATIONS: None immediate. PROCEDURE: Informed written consent was obtained from the patient after a thorough discussion of the procedural risks, benefits and alternatives. All questions were addressed. Maximal Sterile Barrier Technique was utilized including caps, mask, sterile gowns, sterile gloves, sterile drape, hand hygiene and skin antiseptic. A timeout was performed prior to the initiation of the procedure. Preprocedure ultrasound demonstrated moderate to large volume ascites, including in the perihepatic space. The largest fluid pocket visualized in the right lower quadrant. Therefore the right lower quadrant was prepped and draped in standard fashion. The puncture site was planned. Subdermal Local anesthesia was provided with 1% lidocaine with 1:100,000 epinephrine, followed by deeper local anesthetic to the level of the peritoneum under ultrasound guidance. A 5 [place] Yueh needle was advanced into the peritoneal fluid under ultrasound guidance. A total of 3 L of clear, straw-colored fluid was removed during the course of the  procedure. The right upper quadrant and indwelling biloma drain were prepped and draped in standard fashion. Hand injection of contrast via the indwelling biloma drain demonstrated appropriate position within the gallbladder fossa as well as a patent cystic duct. Contrast was visualized within the metallic biliary stent and filled retrograde into the right, nondilated bile ducts. The distal common bile duct stent was occluded. A right posterior duct was identified is in adequate puncture site for biliary drain. A small amount of subdermal Local anesthesia was provided to the right mid axillary line at planned entry site as well as deeper along the hepatic capsule. Under fluoroscopic guidance with multiple obliquities, a 22 gauge Chiba needle was directed toward the opacified right posterior duct. The duct was punctured successfully, confirmed by gentle hand injection of contrast. A Nitrex wire was then directed into the right posterior duct through the metallic common bile duct stent and into the duodenum without difficulty. A 6 French coaxial introducer set was placed over the Nitrex wire. The inner cannula were removed and an Amplatz wire was directed adjacent to the Nitrex wire into the duodenum near the ligament of Treitz. Serial dilation was performed with 8 and 10 [place] dilators. A 10.2 [place] biliary drain with 10 cm of custom cut extra sideholes was then introduced with the pigtail portion coiled in the fourth portion of the duodenum near the ligament of Treitz. Hand injection of contrast demonstrated opacification of the nondilated biliary tree which passed freely into the small bowel. The drain was affixed to the skin with 0 Prolene suture and a Stay Fix dressing. The patient tolerated the procedure well and was  returned to the floor in stable condition. IMPRESSION: 1. Occluded indwelling metallic biliary stent at the distal end, presumed secondary to edema related to duodenitis/pancreatitis. 2.  Indwelling biloma drain in satisfactory position. The cystic duct is patent. 3. Successful placement of an internal external biliary drain via right posterior bile ducts. The distal pigtail portion of the drain is positioned near the ligament of Treitz. 4. Ultrasound-guided paracentesis yielding 3 L of clear, straw-colored fluid. PLAN: Recommend capping biloma drain once any post-procedural hemobilia has resolved. Recommend capping trial of biliary drain 12-24 hours after capping of biloma drain. IR will follow closely. Electronically Signed   By: Ruthann Cancer MD   On: 05/30/2020 08:22   IR Paracentesis  Result Date: 05/30/2020 INDICATION: 64 year old male with history of complex postoperative course after subtotal cholecystectomy for acute gangrenous cholecystitis in May 4854 complicated by biloma formation requiring percutaneous drainage on 02/13/2020 and placement of metallic common bile duct stent on 04/26/2020. Most recently, the patient has developed pancreatitis and duodenitis with ERCP findings consistent with obstruction of the distal common bile duct stent and subsequently high output from the biloma drain. Percutaneous cholangiogram and internal external biliary drain placement is requested for decompression and management of biliary fluid losses. EXAM: 1. Ultrasound-guided paracentesis 2. Cholangiogram via indwelling gallbladder fossa drain 3. Percutaneous cholangiogram 4. Placement of right-sided internal external biliary drain MEDICATIONS: Zosyn 3.375 g; The antibiotic was administered within an appropriate time frame prior to the initiation of the procedure. ANESTHESIA/SEDATION: Moderate (conscious) sedation was employed during this procedure. A total of Versed 2 mg and Fentanyl 75 mcg was administered intravenously. Moderate Sedation Time: 55 minutes. The patient's level of consciousness and vital signs were monitored continuously by radiology nursing throughout the procedure under my direct  supervision. FLUOROSCOPY TIME:  Fluoroscopy Time: 17 minutes 18 seconds (81 mGy). COMPLICATIONS: None immediate. PROCEDURE: Informed written consent was obtained from the patient after a thorough discussion of the procedural risks, benefits and alternatives. All questions were addressed. Maximal Sterile Barrier Technique was utilized including caps, mask, sterile gowns, sterile gloves, sterile drape, hand hygiene and skin antiseptic. A timeout was performed prior to the initiation of the procedure. Preprocedure ultrasound demonstrated moderate to large volume ascites, including in the perihepatic space. The largest fluid pocket visualized in the right lower quadrant. Therefore the right lower quadrant was prepped and draped in standard fashion. The puncture site was planned. Subdermal Local anesthesia was provided with 1% lidocaine with 1:100,000 epinephrine, followed by deeper local anesthetic to the level of the peritoneum under ultrasound guidance. A 5 [place] Yueh needle was advanced into the peritoneal fluid under ultrasound guidance. A total of 3 L of clear, straw-colored fluid was removed during the course of the procedure. The right upper quadrant and indwelling biloma drain were prepped and draped in standard fashion. Hand injection of contrast via the indwelling biloma drain demonstrated appropriate position within the gallbladder fossa as well as a patent cystic duct. Contrast was visualized within the metallic biliary stent and filled retrograde into the right, nondilated bile ducts. The distal common bile duct stent was occluded. A right posterior duct was identified is in adequate puncture site for biliary drain. A small amount of subdermal Local anesthesia was provided to the right mid axillary line at planned entry site as well as deeper along the hepatic capsule. Under fluoroscopic guidance with multiple obliquities, a 22 gauge Chiba needle was directed toward the opacified right posterior duct. The  duct was punctured successfully, confirmed by gentle  hand injection of contrast. A Nitrex wire was then directed into the right posterior duct through the metallic common bile duct stent and into the duodenum without difficulty. A 6 French coaxial introducer set was placed over the Nitrex wire. The inner cannula were removed and an Amplatz wire was directed adjacent to the Nitrex wire into the duodenum near the ligament of Treitz. Serial dilation was performed with 8 and 10 [place] dilators. A 10.2 [place] biliary drain with 10 cm of custom cut extra sideholes was then introduced with the pigtail portion coiled in the fourth portion of the duodenum near the ligament of Treitz. Hand injection of contrast demonstrated opacification of the nondilated biliary tree which passed freely into the small bowel. The drain was affixed to the skin with 0 Prolene suture and a Stay Fix dressing. The patient tolerated the procedure well and was returned to the floor in stable condition. IMPRESSION: 1. Occluded indwelling metallic biliary stent at the distal end, presumed secondary to edema related to duodenitis/pancreatitis. 2. Indwelling biloma drain in satisfactory position. The cystic duct is patent. 3. Successful placement of an internal external biliary drain via right posterior bile ducts. The distal pigtail portion of the drain is positioned near the ligament of Treitz. 4. Ultrasound-guided paracentesis yielding 3 L of clear, straw-colored fluid. PLAN: Recommend capping biloma drain once any post-procedural hemobilia has resolved. Recommend capping trial of biliary drain 12-24 hours after capping of biloma drain. IR will follow closely. Electronically Signed   By: Ruthann Cancer MD   On: 05/30/2020 08:22    Anti-infectives: Anti-infectives (From admission, onward)   Start     Dose/Rate Route Frequency Ordered Stop   05/29/20 0800  piperacillin-tazobactam (ZOSYN) IVPB 3.375 g        3.375 g 12.5 mL/hr over 240 Minutes  Intravenous To Radiology 05/28/20 1328 05/29/20 1943   05/04/20 1230  meropenem (MERREM) 1 g in sodium chloride 0.9 % 100 mL IVPB  Status:  Discontinued        1 g 200 mL/hr over 30 Minutes Intravenous Every 8 hours 05/04/20 1156 05/19/20 0826   05/01/20 1400  piperacillin-tazobactam (ZOSYN) IVPB 3.375 g        3.375 g 12.5 mL/hr over 240 Minutes Intravenous Every 8 hours 05/01/20 1013 05/03/20 0907   04/29/20 2000  vancomycin (VANCOCIN) IVPB 1000 mg/200 mL premix  Status:  Discontinued        1,000 mg 200 mL/hr over 60 Minutes Intravenous Every 12 hours 04/29/20 0653 04/30/20 0831   04/29/20 0745  vancomycin (VANCOREADY) IVPB 1500 mg/300 mL        1,500 mg 150 mL/hr over 120 Minutes Intravenous  Once 04/29/20 0653 04/29/20 1129   04/26/20 2300  piperacillin-tazobactam (ZOSYN) IVPB 3.375 g        3.375 g 12.5 mL/hr over 240 Minutes Intravenous Every 8 hours 04/26/20 1716 05/01/20 0559   04/26/20 1700  piperacillin-tazobactam (ZOSYN) IVPB 3.375 g        3.375 g 100 mL/hr over 30 Minutes Intravenous  Once 04/26/20 1655 04/26/20 1844       Assessment/Plan 64 yo male s/p subtotal cholecystectomy, complicated by persistent bile leak, 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 - Internalize biliary drain today, keep gallbladder fossa drain to gravity drainage. If patient tolerates capping of biliary drain and does not develop bilious output from gallbladder drain, will cap or remove gallbladder drain in the next few days. - Remain NPO, if patient has  further nausea or vomiting recommend replacement of NG tube - Optimize nutrition - continue TPN - Plan for repeat CT abd/pelvis this weekend with pancreas protocol (1 week from last scan) to evaluate maturity of peripancreatic fluid collections - Surgery will continue to follow   LOS: 34 days    Michaelle Birks, MD [facility] Surgery General, Hepatobiliary and Pancreatic Surgery 05/31/20  8:13 AM

## 2020-05-31 NOTE — Progress Notes (Signed)
Patient HR elevated due to pain 10/10 Dilaudid administered. When asking patient if he was nauseated because of pain he states that he is not nauseated although patient is sitting on side of bed with blue N&V bag in hand.   IV Phenergan administered. Will hold off on PO meds at this time   Patient laid back in bed resting comfortably, will continue to monitor.

## 2020-05-31 NOTE — Plan of Care (Signed)

## 2020-05-31 NOTE — Progress Notes (Signed)
Nutrition Follow-up  DOCUMENTATION CODES:   Not applicable  INTERVENTION:   -TPN management per pharmacy -RD will follow for diet advancement and adjust supplement regimen as appropriate  NUTRITION DIAGNOSIS:   Inadequate oral intake related to acute illness as evidenced by NPO status.  Ongoing  GOAL:   Patient will meet greater than or equal to 90% of their needs  Met with TPN  MONITOR:   PO intake, Supplement acceptance, Diet advancement, Labs, Weight trends, Skin, I & O's  REASON FOR ASSESSMENT:   Ventilator    ASSESSMENT:   64-year-old male with PMH of HTN, ERCP on 8/18 s/p biliary stent exchange for persistent postoperative bile leak after recent cholecystectomy with abscess s/- percutaneous drainage. CT abdomen/pelvis showing acute pancreatitis. Pt admitted with severe sepsis secondary to post-ERCP pancreatitis.  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; NGT placed for decompression 9/10- s/p paracentesis- 1.4 L fluid removed 9/11- PICC placed, TPN initiated 9/14- NGT removed, advanced to clear liquid diet 9/15- KUB revealed diffuse colonic stool, no FA, no obstruction; advanced to full liquid diet 9/16- transitioned to cyclic TPN 9/20- NGT reinserted for decompression 9/21- s/p upper GI endoscopy- revealed LA grade C/ grade D esophagitis with active oozing and  stigmata of recent bleeding (stasis esophagitis and NGT likely etiology), gastritis with hemorrhage, congested duodenal mucosa in D1/D2; s/p Ultrasound guided paracentesis (3 L removed), biloma drain check, percutaneous cholangiogram, placement of biliary drain via right bile ducts; NGT removed  Reviewed I/O's: -309 ml x 24 hours and -9.4 L since 05/17/20  UOP: 925 ml x 24 hours  Drain output: 615 ml x 24 hours  Per general surgery notes, Ppan for repeat CT abd/pelvis this weekend to evaluate maturity of peripancreatic fluid collections  Pt pulled out NGT on 05/29/20 and is refusing replacement. He is tolerating ice chips.   Pt remains on cyclic TPN (over 18 hours), which provides 2355 kcals and 144 grams protein, meeting 100% of estimated energy and protein needs.   Labs reviewed: CBGS: 142-212 (inpatient orders for glycemic control are 0-20 units insulin aspart every 4 hours).   Diet Order:   Diet Order            Diet NPO time specified Except for: Sips with Meds  Diet effective midnight                 EDUCATION NEEDS:   Education needs have been addressed  Skin:  Skin Assessment: Skin Integrity Issues: Skin Integrity Issues:: Incisions Incisions: closed abdomen  Last BM:  05/28/20  Height:   Ht Readings from Last 1 Encounters:  05/02/20 5' 9" (1.753 m)    Weight:   Wt Readings from Last 1 Encounters:  05/28/20 83.5 kg    Ideal Body Weight:  72.7 kg  BMI:  Body mass index is 27.18 kg/m.  Estimated Nutritional Needs:   Kcal:  2300-2500  Protein:  130-145 grams  Fluid:  > 2.3 L      Loistine Chance, RD, LDN, [place] Registered Dietitian II Certified Diabetes Care and Education Specialist Please refer to [facility] for RD and/or RD on-call/weekend/after hours pager

## 2020-05-31 NOTE — Progress Notes (Signed)
   05/31/20 1311  Assess: MEWS Score  BP (!) 74/51  Pulse Rate (!) 143  SpO2 97 %  Assess: MEWS Score  MEWS Temp 0  MEWS Systolic 2  MEWS Pulse 3  MEWS RR 0  MEWS LOC 0  MEWS Score 5  MEWS Score Color Red  Assess: if the MEWS score is Yellow or Red  Were vital signs taken at a resting state? Yes  Focused Assessment Change from prior assessment (see assessment flowsheet)  Early Detection of Sepsis Score *See Row Information* Low  MEWS guidelines implemented *See Row Information* Yes  Treat  MEWS Interventions Administered scheduled meds/treatments;Escalated (See documentation below)  Take Vital Signs  Increase Vital Sign Frequency  Red: Q 1hr X 4 then Q 4hr X 4, if remains red, continue Q 4hrs  Escalate  MEWS: Escalate Red: discuss with charge nurse/RN and provider, consider discussing with RRT  Notify: Charge Nurse/RN  Name of Charge Nurse/RN Notified Lazarus Gowda, RN  Date Charge Nurse/RN Notified 05/31/20  Time Charge Nurse/RN Notified 1311  Notify: Provider  Provider Name/Title Dr. Wyline Copas  Date Provider Notified 05/31/20  Time Provider Notified 1311  Notification Type Page  Notification Reason Change in status  Response Other (Comment) (MD up to assess pt)  Date of Provider Response 05/31/20  Time of Provider Response 1311  Notify: Rapid Response  Name of Rapid Response RN Notified Raliegh Ip, RN  Date Rapid Response Notified 05/31/20  Time Rapid Response Notified 1311

## 2020-05-31 NOTE — Progress Notes (Signed)
I was notified that patient acutely decompensated this afternoon. PTBD was placed back to gravity. Labs remarkable for a Tbili of 2.4 (from 0.7 this morning prior to capping drain). Patient is now on nonrebreather, levophed at 80. On exam the abdomen is distended but soft and nontender, no peritoneal signs or guarding. Patient is oriented to place. PTBD to gravity with bilious fluid, gallbladder fossa drain has bile-tinged fluid.   CT abd/pelvis reviewed, shows significant gastric distension. PBD appears to be appropriately positioned in the small bowel. No pneumoperitoneum. Evolving pancreatic necrosis but no gas within the peripancreatic fluid.  Agree with keeping both drains to gravity. Recommend replacement of NG tube to prevent emesis and aspiration. Will continue to follow.  Michaelle Birks, [facility], Hepatobiliary and Pancreatic Surgery 05/31/20 4:14 PM

## 2020-05-31 NOTE — Progress Notes (Signed)
Physical Therapy Treatment Patient Details Name: Adam Fowler MRN: [id number] DOB: 07-25-1956 Today's Date: 05/31/2020    History of Present Illness Pt is a 64 yo male who presents to the ED with severe abdominal pain, nausea and vomiting. Found to have post ERCP acute pancreatits. Pt developed Hypoxic resp failure and was Intubated 8/22  and extubated 8/24. Pt also with new onset a fib and acute ileus. PMH includes HTN and ERCP.     PT Comments    Pt progressing slowly towards his physical therapy goals; seems more limited by pain and fatigue this session (although denying pain when asked). Upon arrival, pt with nasal cannula hanging around his neck; SpO2 83% on RA. Able to rebound to 90% at rest and with mobility on 2L O2.  Ambulating 120 feet with a walker at a min guard assist level; required three extended seated rest breaks. HR 120-139 bpm. Will continue to progress mobility as tolerated.     Follow Up Recommendations  Home health PT;Supervision for mobility/OOB     Equipment Recommendations  None recommended by PT    Recommendations for Other Services       Precautions / Restrictions Precautions Precautions: Fall Precaution Comments: abdominal drain Restrictions Weight Bearing Restrictions: No    Mobility  Bed Mobility Overal bed mobility: Needs Assistance Bed Mobility: Supine to Sit     Supine to sit: Supervision     General bed mobility comments: Pt progressing into long sitting then to edge of bed. Increased time/effort  Transfers Overall transfer level: Needs assistance Equipment used: Rolling walker (2 wheeled) Transfers: Sit to/from Stand Sit to Stand: Min guard            Ambulation/Gait Ambulation/Gait assistance: Min guard Gait Distance (Feet): 120 Feet Assistive device: Rolling walker (2 wheeled) Gait Pattern/deviations: Step-through pattern;Decreased stride length;Trunk flexed Gait velocity: decreased   General Gait Details: Forward head  posture, downward gaze, no overt LOB. Pt requiring 3 seated rest breaks   Stairs             Wheelchair Mobility    Modified Rankin (Stroke Patients Only)       Balance Overall balance assessment: Needs assistance Sitting-balance support: Feet supported;No upper extremity supported Sitting balance-Leahy Scale: Good     Standing balance support: No upper extremity supported;During functional activity Standing balance-Leahy Scale: Fair                              Cognition Arousal/Alertness: Awake/alert Behavior During Therapy: WFL for tasks assessed/performed;Anxious Overall Cognitive Status: Impaired/Different from baseline Area of Impairment: Problem solving;Safety/judgement;Memory                   Current Attention Level: Sustained Memory: Decreased short-term memory Following Commands: Follows one step commands consistently Safety/Judgement: Decreased awareness of safety;Decreased awareness of deficits Awareness: Emergent Problem Solving: Difficulty sequencing;Slow processing;Requires verbal cues;Requires tactile cues        Exercises      General Comments        Pertinent Vitals/Pain Pain Assessment: Faces Faces Pain Scale: Hurts whole lot Pain Location: abdomen - will deny pain but visibly shaking, moaning with mobility Pain Descriptors / Indicators: Moaning;Discomfort;Guarding Pain Intervention(s): Limited activity within patient's tolerance;Monitored during session    Home Living                      Prior Function  PT Goals (current goals can now be found in the care plan section) Acute Rehab PT Goals PT Goal Formulation: With patient Time For Goal Achievement: 06/14/20 Potential to Achieve Goals: Good    Frequency    Min 3X/week      PT Plan Current plan remains appropriate    Co-evaluation              AM-PAC PT "6 Clicks" Mobility   Outcome Measure  Help needed turning from  your back to your side while in a flat bed without using bedrails?: None Help needed moving from lying on your back to sitting on the side of a flat bed without using bedrails?: None Help needed moving to and from a bed to a chair (including a wheelchair)?: A Little Help needed standing up from a chair using your arms (e.g., wheelchair or bedside chair)?: A Little Help needed to walk in hospital room?: A Little Help needed climbing 3-5 steps with a railing? : A Little 6 Click Score: 20    End of Session Equipment Utilized During Treatment: Oxygen Activity Tolerance: Patient tolerated treatment well Patient left: in chair;with call bell/phone within reach Nurse Communication: Mobility status PT Visit Diagnosis: Unsteadiness on feet (R26.81);Muscle weakness (generalized) (M62.81);Difficulty in walking, not elsewhere classified (R26.2)     Time: 0940-7680 PT Time Calculation (min) (ACUTE ONLY): 38 min  Charges:  $Therapeutic Activity: 38-52 mins                       Wyona Almas, PT, DPT Acute Rehabilitation Services Pager [phone] Office [phone]    Deno Etienne 05/31/2020, 12:26 PM

## 2020-05-31 NOTE — Consult Note (Signed)
NAME:  Adam Fowler, MRN:  [id number], DOB:  05/23/1956, LOS: 35 ADMISSION DATE:  04/26/2020, CONSULTATION DATE:  9/23 REFERRING MD:  Wyline Copas, CHIEF COMPLAINT:  Hypotension  Brief History   64 yo M s/p subtotal cholecystectomy c/b persistent bile lek and biliary fistula, post ERCP necrotizing pancreatitis-- developed hypotension 9/23 and CCM consulted   History of present illness   64 yo M PMH  HTN admitted 8/20 after worsening abdominal pain following ERCP 8/18 for biliary stent exchange for persistent post op bile leak r/t subtotal cholecystectomy with abscess s/p perc drainage. Admitted to [facility] and has been followed inpatient by IR, CCS, GI. Intubated 8/21-8/24. Hospital course has been complicated as outlined by [facility] progress notes (see 05/29/20 note) notably with unsuccessful ERCP, TPN initiation, gastric outlet obstruction, underwent internal/external biliary drain placement 9/21.  Pt acutely decompensated 9/23 becoming markedly hypotensive. PCCM consulted   Past Medical History   Kidney stones HTN Insomnia S/p cholecystectomy and abscess  Pancreatitis  Significant Hospital Events   8/20 admit 8/21-24 admitted 9/21 perc bilary drainage  9/23 drain capped. Acute decompensation in afternoon. Fluid resuscitation, starting on pressors and transferring to ICU   Consults:  PCCM GI CCS IR  Procedures:  ETT 8/21-24  Significant Diagnostic Tests:  9/18 ct a/p w/con> moderate peripancreatic fluid extending into gastrohepatic ligament. Possible pseudocyst. Pigtail drainage catheter tip in region of gallbladder fossa. CBD stent. Persistent bibasilar opacities. L pleural effusion. Moderate ascites. Colonic diverticulosis 9/23 KUB>>  9/23 ct a/p w con>>>   Micro Data:   9/23 BCx>>   Antimicrobials:    Interim history/subjective:  Biliary drain capped 0800   Hypotensive on floor PCCM consulted Giving 2 L IVF albumin and starting NE   Objective   Blood pressure (!) 63/41, pulse  (!) 134, temperature (!) 97.1 F (36.2 C), temperature source Axillary, resp. rate 17, height 5\' 9"  (1.753 m), weight 83.5 kg, SpO2 98 %.    FiO2 (%):  [100 %] 100 %   Intake/Output Summary (Last 24 hours) at 05/31/2020 1348 Last data filed at 05/31/2020 0900 Gross per 24 hour  Intake 1231.35 ml  Output 1340 ml  Net -108.65 ml   Filed Weights   05/24/20 0500 05/26/20 0500 05/28/20 0500  Weight: 86.3 kg 84.6 kg 83.5 kg    Examination: General: ill appearing M, in distress r/t pain  HENT: NCAT pink mmm trachea midline Lungs: Diminished bibasilar sounds. Symmetrical chest expansion. Increased RR Cardiovascular: tachycardic, regular. s1s2 no rgm  Abdomen: Distended, diffusely tender. RUQ drains   Extremities:  No obvious joint deformity. No cyanosis or clubbing. No edema Neuro: awake, alert, oriented x2. Following commands.  GU: wnl Psych: agitated, anxious  Resolved Hospital Problem list     Assessment & Plan:   Acute post ERCP pancreatitis Persistent bile leak following cholecystectomy -STAT KUB -STAT CT a/p w con -IR coming to place drain back to gravity  -TPN -GI, CCS, IR following -PRN analgesia   Shock -s/p 3L pressure bagged IVF, started on pressors -Transfer to ICU  -Continue IVF -NE for MAP > 65 -BCx, CBC, CMP mag phos coags   Esophagitis with recent bleeding.  -NPO -follow CBC -BID protonix   HTN -holding in setting of shock   Leukocytosis -pancreatitis vs infection  -follow BCx fever curve  -low threshold for empiric abx    Best practice:  Diet: TPN  Pain/Anxiety/Delirium protocol (if indicated): PRN VAP protocol (if indicated): na DVT prophylaxis: lovenox GI prophylaxis: BID protonix Glucose control:  SSI Mobility: BR Code Status: Full  Family Communication: unable to reach wife  Disposition: Transfer to ICU   Labs   CBC: Recent Labs  Lab 05/26/20 1630 05/27/20 0355 05/28/20 0350 05/29/20 0841 05/31/20 0458  WBC 29.6* 25.5*  23.3* 23.6* 26.2*  NEUTROABS  --   --  19.0*  --   --   HGB 10.4* 9.6* 8.8* 8.6* 8.2*  HCT 34.4* 31.1* 28.6* 29.5* 28.6*  MCV 90.5 90.4 90.2 91.9 92.6  PLT 320 332 321 304 735    Basic Metabolic Panel: Recent Labs  Lab 05/25/20 0359 05/25/20 0359 05/26/20 0349 05/26/20 0349 05/27/20 0355 05/28/20 0350 05/29/20 0404 05/30/20 0514 05/31/20 0458  NA 136   < > 141   < > 142 142 146* 144 143  K 4.3   < > 4.1   < > 4.2 3.8 3.7 3.8 4.7  CL 103   < > 104   < > 102 103 109 109 111  CO2 24   < > 28   < > 31 28 27 25 22   GLUCOSE 186*   < > 320*   < > 344* 297* 203* 260* 206*  BUN 20   < > 28*   < > 37* 41* 39* 43* 47*  CREATININE 0.58*   < > 0.61   < > 0.75 0.82 0.92 0.99 1.05  CALCIUM 8.6*   < > 8.3*   < > 8.4* 8.2* 8.3* 8.2* 8.6*  MG 2.1  --  2.1  --  2.1 2.1  --   --  2.4  PHOS 3.9  --  3.6  --  3.9 3.2  --   --  3.4   < > = values in this interval not displayed.   GFR: Estimated Creatinine Clearance: 71.1 mL/min (by C-G formula based on SCr of 1.05 mg/dL). Recent Labs  Lab 05/27/20 0355 05/28/20 0350 05/29/20 0841 05/31/20 0458  WBC 25.5* 23.3* 23.6* 26.2*    Liver Function Tests: Recent Labs  Lab 05/28/20 0350 05/31/20 0458  AST 28 25  ALT 29 36  ALKPHOS 303* 313*  BILITOT 0.9 0.7  PROT 5.7* 5.7*  ALBUMIN 1.4* 1.3*   No results for input(s): LIPASE, AMYLASE in the last 168 hours. No results for input(s): AMMONIA in the last 168 hours.  ABG    Component Value Date/Time   PHART 7.399 04/30/2020 0241   PCO2ART 40.9 04/30/2020 0241   PO2ART 132 (H) 04/30/2020 0241   HCO3 25.3 04/30/2020 0241   TCO2 26 04/30/2020 0241   ACIDBASEDEF 2.6 (H) 04/29/2020 0835   O2SAT 99.0 04/30/2020 0241     Coagulation Profile: Recent Labs  Lab 05/29/20 0404  INR 1.4*    Cardiac Enzymes: No results for input(s): CKTOTAL, CKMB, CKMBINDEX, TROPONINI in the last 168 hours.  HbA1C: Hgb A1c MFr Bld  Date/Time Value Ref Range Status  04/29/2020 02:02 AM 5.6 4.8 - 5.6 %  Final    Comment:    (NOTE) Pre diabetes:          5.7%-6.4%  Diabetes:              >6.4%  Glycemic control for   <7.0% adults with diabetes   02/13/2020 04:55 PM 6.0 (H) 4.8 - 5.6 % Final    Comment:    (NOTE) Pre diabetes:          5.7%-6.4% Diabetes:              >6.4% Glycemic  control for   <7.0% adults with diabetes     CBG: Recent Labs  Lab 05/30/20 2030 05/31/20 0014 05/31/20 0436 05/31/20 0756 05/31/20 1142  GLUCAP 232* 212* 178* 142* 158*     Critical care time: 55 min     CRITICAL CARE Performed by: Cristal Generous   Total critical care time: 55 minutes  Critical care time was exclusive of separately billable procedures and treating other patients. Critical care was necessary to treat or prevent imminent or life-threatening deterioration.  Critical care was time spent personally by me on the following activities: development of treatment plan with patient and/or surrogate as well as nursing, discussions with consultants, evaluation of patient's response to treatment, examination of patient, obtaining history from patient or surrogate, ordering and performing treatments and interventions, ordering and review of laboratory studies, ordering and review of radiographic studies, pulse oximetry and re-evaluation of patient's condition.   Eliseo Gum MSN, AGACNP-BC [place] [phone] If no answer, [phone] 05/31/2020, 2:58 PM

## 2020-05-31 NOTE — Progress Notes (Signed)
[place] Progress Note Patient Name: Adam Fowler DOB: 06/23/1956 MRN: [id number]   Date of Service  05/31/2020  HPI/Events of Note  Patient with acute biliary pancreatitis and septic shock, lactate is 5.3. He also has acute hypoxemic respiratory failure on a non  Re-breather oxygen mask.  eICU Interventions  Albumin 5 %  500 ml iv bolus x 1        Adam Fowler U Siana Panameno 05/31/2020, 7:56 PM

## 2020-05-31 NOTE — Progress Notes (Addendum)
1330-1500 event summary Responded to call for rapid response assistance. Patient hypotensive, VS as per documentation. Critical care medicine at bedside, verbal order for NS bolus total 3L, levophed ordered and when available initiated at 41mcg/min @ 1345 per CCM NP at bsd. PICC to RUA both lumens flush well with positive blood return.  Albumin 5% x2 vials given per verbal order from CCM. Patient tolerated fluids well, with positive response with BP and HR decreased to 120's, see associated VS as documented, axillary temp obtained. abd drain was clamped, reconnected to gravity drainage bag per verbal order of CCMP NP, brownish thin fluid returned in tubing/bag. Patient remains awake and alert, nonrebreather mask in place, tachypenic. C/o pain, medicated per verbal order with 45mcg fentanyl IVP per verbal order from CCM and 12.5mg  phenergan for c/o nausea. Pt's wife to be notified per primary RN. Pt to be transported to CT with monitored transport then to ICU.

## 2020-05-31 NOTE — Progress Notes (Signed)
Daily Rounding Note  05/31/2020, 11:50 AM  LOS: 34 days   SUBJECTIVE:   Chief complaint:   Bile leak, pancreatitis.     Yesterday 656mL output to perc bile duct drain, 15 mL output biloma drain  Pt says he feels great but looks bad. Nausea persists, no emesis.  No BM's x many days.  Got dilaudid for pain ealier RN says pt fine at change of shift but decompensated during visit from wife when shaking, perturbed speech and abd pain began.  Eventually wife left per RN.    OBJECTIVE:         Vital signs in last 24 hours:    Temp:  [98.4 F (36.9 C)-100.4 F (38 C)] 99.6 F (37.6 C) (09/23 0756) Pulse Rate:  [98-109] 98 (09/23 0756) Resp:  [14-18] 17 (09/23 0756) BP: (115-124)/(63-80) 115/75 (09/23 0756) SpO2:  [90 %-95 %] 90 % (09/23 0756) Last BM Date: 05/28/20 Filed Weights   05/24/20 0500 05/26/20 0500 05/28/20 0500  Weight: 86.3 kg 84.6 kg 83.5 kg   General: shaking in R UE, tremulous.  Looks bad  Heart: RRR Chest: clear bil.   Abdomen: distended, tense, NT.  BS hypoactive.  Scant fluid in GB fossa drain, bile in perc billiary drain  Extremities: shaking pronounced at rest in R UE, tremulous.  No sweating Neuro/Psych:  Seems extremely anxious, worse than his usual.    Intake/Output from previous day: 09/22 0701 - 09/23 0700 In: 1231.4 [I.V.:1231.4] Out: 1540 [Urine:925; Drains:615]  Intake/Output this shift: No intake/output data recorded.  Lab Results: Recent Labs    05/29/20 0841 05/31/20 0458  WBC 23.6* 26.2*  HGB 8.6* 8.2*  HCT 29.5* 28.6*  PLT 304 311   BMET Recent Labs    05/29/20 0404 05/30/20 0514 05/31/20 0458  NA 146* 144 143  K 3.7 3.8 4.7  CL 109 109 111  CO2 27 25 22   GLUCOSE 203* 260* 206*  BUN 39* 43* 47*  CREATININE 0.92 0.99 1.05  CALCIUM 8.3* 8.2* 8.6*   LFT Recent Labs    05/31/20 0458  PROT 5.7*  ALBUMIN 1.3*  AST 25  ALT 36  ALKPHOS 313*  BILITOT 0.7    PT/INR Recent Labs    05/29/20 0404  LABPROT 16.4*  INR 1.4*   Hepatitis Panel No results for input(s): HEPBSAG, HCVAB, HEPAIGM, HEPBIGM in the last 72 hours.  Studies/Results: IR CHOLANGIOGRAM EXISTING TUBE  IR INT EXT BILIARY DRAIN WITH CHOLANGIOGRAM IR Paracentesis  Result Date: 05/30/2020 PROCEDURE: Informed written consent was obtained from the patient after a thorough discussion of the procedural risks, benefits and alternatives. All questions were addressed. Maximal Sterile Barrier Technique was utilized including caps, mask, sterile gowns, sterile gloves, sterile drape, hand hygiene and skin antiseptic. A timeout was performed prior to the initiation of the procedure. Preprocedure ultrasound demonstrated moderate to large volume ascites, including in the perihepatic space. The largest fluid pocket visualized in the right lower quadrant. Therefore the right lower quadrant was prepped and draped in standard fashion. The puncture site was planned. Subdermal Local anesthesia was provided with 1% lidocaine with 1:100,000 epinephrine, followed by deeper local anesthetic to the level of the peritoneum under ultrasound guidance. A 5 [place] Yueh needle was advanced into the peritoneal fluid under ultrasound guidance. A total of 3 L of clear, straw-colored fluid was removed during the course of the procedure. The right upper quadrant and indwelling biloma drain were prepped and draped  in standard fashion. Hand injection of contrast via the indwelling biloma drain demonstrated appropriate position within the gallbladder fossa as well as a patent cystic duct. Contrast was visualized within the metallic biliary stent and filled retrograde into the right, nondilated bile ducts. The distal common bile duct stent was occluded. A right posterior duct was identified is in adequate puncture site for biliary drain. A small amount of subdermal Local anesthesia was provided to the right mid axillary line at  planned entry site as well as deeper along the hepatic capsule. Under fluoroscopic guidance with multiple obliquities, a 22 gauge Chiba needle was directed toward the opacified right posterior duct. The duct was punctured successfully, confirmed by gentle hand injection of contrast. A Nitrex wire was then directed into the right posterior duct through the metallic common bile duct stent and into the duodenum without difficulty. A 6 French coaxial introducer set was placed over the Nitrex wire. The inner cannula were removed and an Amplatz wire was directed adjacent to the Nitrex wire into the duodenum near the ligament of Treitz. Serial dilation was performed with 8 and 10 [place] dilators. A 10.2 [place] biliary drain with 10 cm of custom cut extra sideholes was then introduced with the pigtail portion coiled in the fourth portion of the duodenum near the ligament of Treitz. Hand injection of contrast demonstrated opacification of the nondilated biliary tree which passed freely into the small bowel. The drain was affixed to the skin with 0 Prolene suture and a Stay Fix dressing. The patient tolerated the procedure well and was returned to the floor in stable condition. IMPRESSION: 1. Occluded indwelling metallic biliary stent at the distal end, presumed secondary to edema related to duodenitis/pancreatitis. 2. Indwelling biloma drain in satisfactory position. The cystic duct is patent. 3. Successful placement of an internal external biliary drain via right posterior bile ducts. The distal pigtail portion of the drain is positioned near the ligament of Treitz. 4. Ultrasound-guided paracentesis yielding 3 L of clear, straw-colored fluid. PLAN: Recommend capping biloma drain once any post-procedural hemobilia has resolved. Recommend capping trial of biliary drain 12-24 hours after capping of biloma drain. IR will follow closely. Electronically Signed   By: Ruthann Cancer MD   On: 05/30/2020 08:22   Scheduled Meds: .  acetaminophen  650 mg Oral Q6H  . acidophilus  2 capsule Oral Daily  . Chlorhexidine Gluconate Cloth  6 each Topical Daily  . enoxaparin (LOVENOX) injection  40 mg Subcutaneous Q24H  . influenza vac split quadrivalent PF  0.5 mL Intramuscular Tomorrow-1000  . insulin aspart  0-20 Units Subcutaneous Q4H  . mouth rinse  15 mL Mouth Rinse BID  . oxyCODONE  20 mg Oral Q12H  . pantoprazole (PROTONIX) IV  40 mg Intravenous Q12H  . polyethylene glycol  17 g Oral Daily  . pravastatin  20 mg Oral q1800  . sertraline  100 mg Oral Daily  . sodium chloride flush  10-40 mL Intracatheter Q12H  . sodium chloride flush  5 mL Intracatheter Q8H  . traZODone  50 mg Oral QHS   Continuous Infusions: . sodium chloride Stopped (05/07/20 0923)  . TPN CYCLIC-ADULT (ION)     PRN Meds:.bisacodyl, HYDROmorphone (DILAUDID) injection, ipratropium-albuterol, lidocaine, LORazepam, metoprolol tartrate, oxyCODONE, polyvinyl alcohol, promethazine, sodium chloride flush, sodium chloride flush   ASSESMENT:   *  Post op bile leak.  Persistent high output via indwelling biloma drain despite ERCP with stenting on 2 occasions.  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  9/21 IR placement of biliary drain via R bile duct along w 3 litre paracentesis. The metal biliary stent was occluded  Biliary drain capped today.   Dr Michaelle Birks is following.    *   Severe post ERCP pancreatitis.     CTAP 9/18: Stable moderate peripancreatic fluid extending into the gastrohepatic ligament unchanged. This fluid is fairly organized and could represent involving pseudocysts and less likely abscess. Pigtail drainage catheter tip in the region of the gallbladder fossa unchanged. Common bile duct stent unchanged.    *   Duodenitis secondary to pancreatitis w GOO.  Has had NGTs on at least 2 occasions but not currently.    *   Rising WBC counts 23.6 >> 26.2 last 24 hours.  Not on Abx, completed multiple d of zosyn on 8/25.  Meropenem 8/27 -  9/10  *    Anxiety.  This has been an issue throughout admission but suspect this is impeding progression.   On home zoloft dose.  Wonder about getting BH eval to see if other meds might help?? Also curious what wife's take is on his behavior.    *   [place] anemia.  unsuccessful EGD 9/21: severe esophagitis with oozing blood, hemorrhagic gastritis and gastric NGT trauma.  Congested duodenal mucosa D1/D2, normal D3.     PLAN   *   IR plans internalization of biliary drain today.  Hopefully can remove biloma drain in next few days.    *   Wonder about getting BH eval to see if other meds might help?? Also curious what wife's take is on his behavior.      Adam Fowler  05/31/2020, 11:50 AM Phone [phone]

## 2020-05-31 NOTE — Progress Notes (Signed)
OT Cancellation Note  Patient Details Name: Adam Fowler MRN: [id number] DOB: 12/26/1955   Cancelled Treatment:    Reason Eval/Treat Not Completed: Patient not medically ready. Spoke with RN who recommended holding therapy at this time d/t pt shaking and in pain, recently given Dilaudid. Will re-attempt OT session tomorrow as appropriate.   Layla Maw 05/31/2020, 12:10 PM

## 2020-05-31 NOTE — Progress Notes (Addendum)
It was brought to this nurses attention by Dr. Zenia Resides that the TPN IV line was not connected and draining on the floor. This nurse cleaned up the spill and it was all under the bed so it was not running to the pt for a bit of time this nurse would estimate. Cleaned both lines and connected to picc line, TPN currently running at 187ml/hr

## 2020-05-31 NOTE — Progress Notes (Signed)
Called to patient's room to assess capped drain due to acute decompensation - patient now hypotensive, significant pain, AMS.   Discussed patient status change wit critical care NP at bedside, awaiting ICU transfer and further workup.   Biliary drain was placed to gravity with return of clear bilious output. Recommend drain to remain to gravity for now until patient stabilizes and would also recommend proceeding with CT abd/pelvis w/contrast if renal function allows.  IR will continue to follow along - please call with questions or concerns.  Candiss Norse, PA-C

## 2020-05-31 NOTE — Progress Notes (Signed)
Referring Physician(s): Dr. Zenia Resides  Supervising Physician: Dr. Serafina Royals  Patient Status:  [facility] - In-pt  Chief Complaint: High output biliary fistula, s/p cholecystectomy, s/p biloma drain placement 02/13/20. Post-ERCP pancreatitis with gastric outlet obstruction. S/p common bile duct stent placement but with continued increased biloma drain output. Patient underwent an internal/external biliary drain placement with Dr. Serafina Royals 05/29/20.   Subjective: Biliary drain capped by Dr. Zenia Resides at approximately 0800 today. Patient in bed, loudly moaning and crying out. He is shaking/writhing in bed. He states he is in pain but he is unable to tell me where he hurts. He is unable to answer any question but he was responsive to suggestions to take slow deep breaths and try to relax. Per bedside RN, this behavior started this morning when his wife visited. RN unable to say with certainty if the behavior started before or after Dr. Zenia Resides capped the biliary drain.   Allergies: Toradol [ketorolac tromethamine]  Medications: Prior to Admission medications   Medication Sig Start Date End Date Taking? Authorizing Provider  Calcium Carb-Cholecalciferol (CALCIUM+D3 PO) Take 1 tablet by mouth daily.   Yes [provider]  Coenzyme Q10 (COQ10) 400 MG CAPS Take 800 mg by mouth in the morning.    Yes [provider]  Cyanocobalamin (VITAMIN B-12) 5000 MCG TBDP Take 5,000 mcg by mouth daily.   Yes [provider]  GLUCOSAMINE-CHONDROITIN PO Take 1 tablet by mouth in the morning and at bedtime.    Yes [provider]  Javier Docker Oil 1000 MG CAPS Take 1,000 mg by mouth in the morning and at bedtime.    Yes [provider]  lisinopril-hydrochlorothiazide (ZESTORETIC) 10-12.5 MG tablet Take 1 tablet by mouth daily. 03/04/20  Yes [provider]  Moringa Oleifera (MORINGA PO) Take 1,000 mg by mouth 2 (two) times daily.   Yes [provider]  Multiple Vitamin  (MULTIVITAMIN WITH MINERALS) TABS tablet Take 1 tablet by mouth daily.   Yes [provider]  ondansetron (ZOFRAN) 4 MG tablet Take 1 tablet (4 mg total) by mouth every 8 (eight) hours as needed for nausea or vomiting. 04/25/20  Yes Mansouraty, Telford Nab., MD  OVER THE COUNTER MEDICATION Take 1 tablet by mouth See admin instructions. [facility] otc tablet- Take 1 tablet by mouth once a day   Yes [provider]  oxyCODONE (OXY IR/ROXICODONE) 5 MG immediate release tablet Take 5 mg by mouth 2 (two) times daily as needed (for pain).  04/25/20  Yes [provider]  pravastatin (PRAVACHOL) 80 MG tablet Take 40 mg by mouth in the morning and at bedtime.   Yes [provider]  Red Yeast Rice 600 MG CAPS Take 1,200 mg by mouth at bedtime.    Yes [provider]  sertraline (ZOLOFT) 100 MG tablet Take 100 mg by mouth daily. 11/08/19  Yes [provider]  TURMERIC PO Take 1,500 mg by mouth 2 (two) times daily.   Yes [provider]  zolpidem (AMBIEN) 5 MG tablet Take 5 mg by mouth at bedtime. 12/29/19  Yes [provider]  ibuprofen (ADVIL) 200 MG tablet Take 400-600 mg by mouth every 6 (six) hours as needed for headache or moderate pain.    [provider]     Vital Signs: BP 115/75 (BP Location: Left Arm)   Pulse 98   Temp 99.6 F (37.6 C) (Oral)   Resp 17   Ht 5\' 9"  (1.753 m)   Wt 184 lb 1.4  oz (83.5 kg)   SpO2 90%   BMI 27.18 kg/m   Physical Exam Constitutional:      Comments: Eyes open, staring at the ceiling. Mouth open with tongue protruding slightly. Intermittently responsive to my questions/commands but he was never able to answer my questions regarding his current situation.   HENT:     Mouth/Throat:     Mouth: Mucous membranes are dry.  Pulmonary:     Effort: No respiratory distress.     Comments: Slightly tachypneic Abdominal:     Palpations: Abdomen is soft.     Tenderness: There is no abdominal  tenderness.     Comments: Biliary drain currently capped. Biloma drain to gravity bag with approximately 100 cc of thin, light brown fluid in bag. Both sites are clean and dry.   Musculoskeletal:     Comments: Visible tremors and stiffness in bilateral arms/hands.   Skin:    General: Skin is warm and dry.     Comments: Axillary temp 97.1  Neurological:     Mental Status: He is disoriented.  Psychiatric:        Attention and Perception: He is inattentive.        Mood and Affect: Affect is inappropriate.        Behavior: Behavior is hyperactive.        Cognition and Memory: Cognition is impaired.     Comments: Repetitive language: "stop the pain, stop the pain. Calm down [name], Calm down [name]"     Imaging: DG Abd Portable 1V  Result Date: 05/27/2020 CLINICAL DATA:  Nasogastric tube positioning. EXAM: PORTABLE ABDOMEN - 1 VIEW COMPARISON:  May 23, 2020 FINDINGS: Since the prior study there is been interval placement of a nasogastric tube, with its distal tip overlying the expected region of the body of the stomach. The bowel gas pattern is normal. Stable right-sided percutaneous pigtail catheter positioning is seen. A radiopaque stent is again seen overlying the right upper quadrant. No radio-opaque calculi or other significant radiographic abnormality are seen. IMPRESSION: Nasogastric tube positioning, as described above. Electronically Signed   By: Virgina Norfolk M.D.   On: 05/27/2020 22:04   DG C-Arm 1-60 Min-No Report  Result Date: 05/29/2020 Fluoroscopy was utilized by the requesting physician.  No radiographic interpretation.   IR CHOLANGIOGRAM EXISTING TUBE  Result Date: 05/30/2020 Narrative & Impression INDICATION: 64 year old male with history of complex postoperative course after subtotal cholecystectomy for acute gangrenous cholecystitis in May 6759 complicated by biloma formation requiring percutaneous drainage on 02/13/2020 and placement of metallic common bile duct stent  on 04/26/2020. Most recently, the patient has developed pancreatitis and duodenitis with ERCP findings consistent with obstruction of the distal common bile duct stent and subsequently high output from the biloma drain. Percutaneous cholangiogram and internal external biliary drain placement is requested for decompression and management of biliary fluid losses.  EXAM: 1. Ultrasound-guided paracentesis 2. Cholangiogram via indwelling gallbladder fossa drain 3. Percutaneous cholangiogram 4. Placement of right-sided internal external biliary drain  MEDICATIONS: Zosyn 3.375 g; The antibiotic was administered within an appropriate time frame prior to the initiation of the procedure.  ANESTHESIA/SEDATION: Moderate (conscious) sedation was employed during this procedure. A total of Versed 2 mg and Fentanyl 75 mcg was administered intravenously.  Moderate Sedation Time: 55 minutes. The patient's level of consciousness and vital signs were monitored continuously by radiology nursing throughout the procedure under my direct supervision.  FLUOROSCOPY TIME:  Fluoroscopy Time: 17 minutes 18 seconds (81 mGy).  COMPLICATIONS: None  immediate.  PROCEDURE: Informed written consent was obtained from the patient after a thorough discussion of the procedural risks, benefits and alternatives. All questions were addressed. Maximal Sterile Barrier Technique was utilized including caps, mask, sterile gowns, sterile gloves, sterile drape, hand hygiene and skin antiseptic. A timeout was performed prior to the initiation of the procedure.  Preprocedure ultrasound demonstrated moderate to large volume ascites, including in the perihepatic space. The largest fluid pocket visualized in the right lower quadrant. Therefore the right lower quadrant was prepped and draped in standard fashion. The puncture site was planned. Subdermal Local anesthesia was provided with 1% lidocaine with 1:100,000 epinephrine, followed by deeper local anesthetic  to the level of the peritoneum under ultrasound guidance. A 5 [place] Yueh needle was advanced into the peritoneal fluid under ultrasound guidance. A total of 3 L of clear, straw-colored fluid was removed during the course of the procedure.  The right upper quadrant and indwelling biloma drain were prepped and draped in standard fashion. Hand injection of contrast via the indwelling biloma drain demonstrated appropriate position within the gallbladder fossa as well as a patent cystic duct. Contrast was visualized within the metallic biliary stent and filled retrograde into the right, nondilated bile ducts. The distal common bile duct stent was occluded. A right posterior duct was identified is in adequate puncture site for biliary drain. A small amount of subdermal Local anesthesia was provided to the right mid axillary line at planned entry site as well as deeper along the hepatic capsule. Under fluoroscopic guidance with multiple obliquities, a 22 gauge Chiba needle was directed toward the opacified right posterior duct. The duct was punctured successfully, confirmed by gentle hand injection of contrast. A Nitrex wire was then directed into the right posterior duct through the metallic common bile duct stent and into the duodenum without difficulty. A 6 French coaxial introducer set was placed over the Nitrex wire. The inner cannula were removed and an Amplatz wire was directed adjacent to the Nitrex wire into the duodenum near the ligament of Treitz. Serial dilation was performed with 8 and 10 [place] dilators. A 10.2 [place] biliary drain with 10 cm of custom cut extra sideholes was then introduced with the pigtail portion coiled in the fourth portion of the duodenum near the ligament of Treitz. Hand injection of contrast demonstrated opacification of the nondilated biliary tree which passed freely into the small bowel. The drain was affixed to the skin with 0 Prolene suture and a Stay Fix dressing.  The patient  tolerated the procedure well and was returned to the floor in stable condition.  IMPRESSION: 1. Occluded indwelling metallic biliary stent at the distal end, presumed secondary to edema related to duodenitis/pancreatitis. 2. Indwelling biloma drain in satisfactory position. The cystic duct is patent. 3. Successful placement of an internal external biliary drain via right posterior bile ducts. The distal pigtail portion of the drain is positioned near the ligament of Treitz. 4. Ultrasound-guided paracentesis yielding 3 L of clear, straw-colored fluid.  PLAN:  Recommend capping biloma drain once any post-procedural hemobilia has resolved. Recommend capping trial of biliary drain 12-24 hours after capping of biloma drain. IR will follow closely.   Electronically Signed   By: Ruthann Cancer MD   On: 05/30/2020 08:22  Result History   IR INT EXT BILIARY DRAIN WITH CHOLANGIOGRAM  Result Date: 05/30/2020 INDICATION: 64 year old male with history of complex postoperative course after subtotal cholecystectomy for acute gangrenous cholecystitis in May 8882 complicated by biloma formation requiring  percutaneous drainage on 02/13/2020 and placement of metallic common bile duct stent on 04/26/2020. Most recently, the patient has developed pancreatitis and duodenitis with ERCP findings consistent with obstruction of the distal common bile duct stent and subsequently high output from the biloma drain. Percutaneous cholangiogram and internal external biliary drain placement is requested for decompression and management of biliary fluid losses. EXAM: 1. Ultrasound-guided paracentesis 2. Cholangiogram via indwelling gallbladder fossa drain 3. Percutaneous cholangiogram 4. Placement of right-sided internal external biliary drain MEDICATIONS: Zosyn 3.375 g; The antibiotic was administered within an appropriate time frame prior to the initiation of the procedure. ANESTHESIA/SEDATION: Moderate (conscious) sedation was employed  during this procedure. A total of Versed 2 mg and Fentanyl 75 mcg was administered intravenously. Moderate Sedation Time: 55 minutes. The patient's level of consciousness and vital signs were monitored continuously by radiology nursing throughout the procedure under my direct supervision. FLUOROSCOPY TIME:  Fluoroscopy Time: 17 minutes 18 seconds (81 mGy). COMPLICATIONS: None immediate. PROCEDURE: Informed written consent was obtained from the patient after a thorough discussion of the procedural risks, benefits and alternatives. All questions were addressed. Maximal Sterile Barrier Technique was utilized including caps, mask, sterile gowns, sterile gloves, sterile drape, hand hygiene and skin antiseptic. A timeout was performed prior to the initiation of the procedure. Preprocedure ultrasound demonstrated moderate to large volume ascites, including in the perihepatic space. The largest fluid pocket visualized in the right lower quadrant. Therefore the right lower quadrant was prepped and draped in standard fashion. The puncture site was planned. Subdermal Local anesthesia was provided with 1% lidocaine with 1:100,000 epinephrine, followed by deeper local anesthetic to the level of the peritoneum under ultrasound guidance. A 5 [place] Yueh needle was advanced into the peritoneal fluid under ultrasound guidance. A total of 3 L of clear, straw-colored fluid was removed during the course of the procedure. The right upper quadrant and indwelling biloma drain were prepped and draped in standard fashion. Hand injection of contrast via the indwelling biloma drain demonstrated appropriate position within the gallbladder fossa as well as a patent cystic duct. Contrast was visualized within the metallic biliary stent and filled retrograde into the right, nondilated bile ducts. The distal common bile duct stent was occluded. A right posterior duct was identified is in adequate puncture site for biliary drain. A small amount of  subdermal Local anesthesia was provided to the right mid axillary line at planned entry site as well as deeper along the hepatic capsule. Under fluoroscopic guidance with multiple obliquities, a 22 gauge Chiba needle was directed toward the opacified right posterior duct. The duct was punctured successfully, confirmed by gentle hand injection of contrast. A Nitrex wire was then directed into the right posterior duct through the metallic common bile duct stent and into the duodenum without difficulty. A 6 French coaxial introducer set was placed over the Nitrex wire. The inner cannula were removed and an Amplatz wire was directed adjacent to the Nitrex wire into the duodenum near the ligament of Treitz. Serial dilation was performed with 8 and 10 [place] dilators. A 10.2 [place] biliary drain with 10 cm of custom cut extra sideholes was then introduced with the pigtail portion coiled in the fourth portion of the duodenum near the ligament of Treitz. Hand injection of contrast demonstrated opacification of the nondilated biliary tree which passed freely into the small bowel. The drain was affixed to the skin with 0 Prolene suture and a Stay Fix dressing. The patient tolerated the procedure well and was returned  to the floor in stable condition. IMPRESSION: 1. Occluded indwelling metallic biliary stent at the distal end, presumed secondary to edema related to duodenitis/pancreatitis. 2. Indwelling biloma drain in satisfactory position. The cystic duct is patent. 3. Successful placement of an internal external biliary drain via right posterior bile ducts. The distal pigtail portion of the drain is positioned near the ligament of Treitz. 4. Ultrasound-guided paracentesis yielding 3 L of clear, straw-colored fluid. PLAN: Recommend capping biloma drain once any post-procedural hemobilia has resolved. Recommend capping trial of biliary drain 12-24 hours after capping of biloma drain. IR will follow closely. Electronically  Signed   By: Ruthann Cancer MD   On: 05/30/2020 08:22   IR Paracentesis  Result Date: 05/30/2020 INDICATION: 64 year old male with history of complex postoperative course after subtotal cholecystectomy for acute gangrenous cholecystitis in May 0211 complicated by biloma formation requiring percutaneous drainage on 02/13/2020 and placement of metallic common bile duct stent on 04/26/2020. Most recently, the patient has developed pancreatitis and duodenitis with ERCP findings consistent with obstruction of the distal common bile duct stent and subsequently high output from the biloma drain. Percutaneous cholangiogram and internal external biliary drain placement is requested for decompression and management of biliary fluid losses. EXAM: 1. Ultrasound-guided paracentesis 2. Cholangiogram via indwelling gallbladder fossa drain 3. Percutaneous cholangiogram 4. Placement of right-sided internal external biliary drain MEDICATIONS: Zosyn 3.375 g; The antibiotic was administered within an appropriate time frame prior to the initiation of the procedure. ANESTHESIA/SEDATION: Moderate (conscious) sedation was employed during this procedure. A total of Versed 2 mg and Fentanyl 75 mcg was administered intravenously. Moderate Sedation Time: 55 minutes. The patient's level of consciousness and vital signs were monitored continuously by radiology nursing throughout the procedure under my direct supervision. FLUOROSCOPY TIME:  Fluoroscopy Time: 17 minutes 18 seconds (81 mGy). COMPLICATIONS: None immediate. PROCEDURE: Informed written consent was obtained from the patient after a thorough discussion of the procedural risks, benefits and alternatives. All questions were addressed. Maximal Sterile Barrier Technique was utilized including caps, mask, sterile gowns, sterile gloves, sterile drape, hand hygiene and skin antiseptic. A timeout was performed prior to the initiation of the procedure. Preprocedure ultrasound demonstrated  moderate to large volume ascites, including in the perihepatic space. The largest fluid pocket visualized in the right lower quadrant. Therefore the right lower quadrant was prepped and draped in standard fashion. The puncture site was planned. Subdermal Local anesthesia was provided with 1% lidocaine with 1:100,000 epinephrine, followed by deeper local anesthetic to the level of the peritoneum under ultrasound guidance. A 5 [place] Yueh needle was advanced into the peritoneal fluid under ultrasound guidance. A total of 3 L of clear, straw-colored fluid was removed during the course of the procedure. The right upper quadrant and indwelling biloma drain were prepped and draped in standard fashion. Hand injection of contrast via the indwelling biloma drain demonstrated appropriate position within the gallbladder fossa as well as a patent cystic duct. Contrast was visualized within the metallic biliary stent and filled retrograde into the right, nondilated bile ducts. The distal common bile duct stent was occluded. A right posterior duct was identified is in adequate puncture site for biliary drain. A small amount of subdermal Local anesthesia was provided to the right mid axillary line at planned entry site as well as deeper along the hepatic capsule. Under fluoroscopic guidance with multiple obliquities, a 22 gauge Chiba needle was directed toward the opacified right posterior duct. The duct was punctured successfully, confirmed by gentle hand injection  of contrast. A Nitrex wire was then directed into the right posterior duct through the metallic common bile duct stent and into the duodenum without difficulty. A 6 French coaxial introducer set was placed over the Nitrex wire. The inner cannula were removed and an Amplatz wire was directed adjacent to the Nitrex wire into the duodenum near the ligament of Treitz. Serial dilation was performed with 8 and 10 [place] dilators. A 10.2 [place] biliary drain with 10 cm of  custom cut extra sideholes was then introduced with the pigtail portion coiled in the fourth portion of the duodenum near the ligament of Treitz. Hand injection of contrast demonstrated opacification of the nondilated biliary tree which passed freely into the small bowel. The drain was affixed to the skin with 0 Prolene suture and a Stay Fix dressing. The patient tolerated the procedure well and was returned to the floor in stable condition. IMPRESSION: 1. Occluded indwelling metallic biliary stent at the distal end, presumed secondary to edema related to duodenitis/pancreatitis. 2. Indwelling biloma drain in satisfactory position. The cystic duct is patent. 3. Successful placement of an internal external biliary drain via right posterior bile ducts. The distal pigtail portion of the drain is positioned near the ligament of Treitz. 4. Ultrasound-guided paracentesis yielding 3 L of clear, straw-colored fluid. PLAN: Recommend capping biloma drain once any post-procedural hemobilia has resolved. Recommend capping trial of biliary drain 12-24 hours after capping of biloma drain. IR will follow closely. Electronically Signed   By: Ruthann Cancer MD   On: 05/30/2020 08:22    Labs:  CBC: Recent Labs    05/27/20 0355 05/28/20 0350 05/29/20 0841 05/31/20 0458  WBC 25.5* 23.3* 23.6* 26.2*  HGB 9.6* 8.8* 8.6* 8.2*  HCT 31.1* 28.6* 29.5* 28.6*  PLT 332 321 304 311    COAGS: Recent Labs    02/02/20 1902 02/02/20 2115 05/16/20 1905 05/29/20 0404  INR 1.2 1.1 1.3* 1.4*    BMP: Recent Labs    05/28/20 0350 05/29/20 0404 05/30/20 0514 05/31/20 0458  NA 142 146* 144 143  K 3.8 3.7 3.8 4.7  CL 103 109 109 111  CO2 28 27 25 22   GLUCOSE 297* 203* 260* 206*  BUN 41* 39* 43* 47*  CALCIUM 8.2* 8.3* 8.2* 8.6*  CREATININE 0.82 0.92 0.99 1.05  GFRNONAA >60 >60 >60 >60  GFRAA >60 >60 >60 >60    LIVER FUNCTION TESTS: Recent Labs    05/22/20 0424 05/24/20 0405 05/28/20 0350 05/31/20 0458    BILITOT 0.6 0.7 0.9 0.7  AST 20 20 28 25   ALT 16 17 29  36  ALKPHOS 276* 220* 303* 313*  PROT 5.4* 5.7* 5.7* 5.7*  ALBUMIN 1.6* 1.6* 1.4* 1.3*    Assessment and Plan:  Biliary and biloma drains: High output biliary fistula, s/p cholecystectomy, s/p biloma drain placement 02/13/20. Post-ERCP pancreatitis with gastric outlet obstruction. S/p common bile duct stent placement but with continued increased biloma drain output. Patient underwent an internal/external biliary drain placement with Dr. Serafina Royals 05/29/20. The biliary drain was capped by Dr. Zenia Resides this morning at approximately 0800. The patient was assessed by IR around 11 am and found to be in distress of uncertain etiology. Dr. Zenia Resides was paged. Dr. Serafina Royals was also notified; decision made to follow up on patient later today to assess pain/behavior.   Addendum: Critical care was called to the bedside to assess the patient at approximately 1330. Patient was hypotensive (60s/40s) and patient was started on fluid boluses and pressors. The  patient is currently being moved to the ICU. IR to go to the bedside to assess.   Electronically Signed: Soyla Dryer, AGACNP-BC [phone] 05/31/2020, 11:40 AM   I spent a total of 25 Minutes at the the patient's bedside AND on the patient's hospital floor or unit, greater than 50% of which was counseling/coordinating care for biloma and biliary drains.

## 2020-05-31 NOTE — Progress Notes (Signed)
PCCM interval progress note  At bedside again to re-evaluate patient who has returned to 5N from CT a/p. Pt 92% on NRB, elevated RR, tachycardic and hypotensive now on NE 30. Lethargic but oriented x2.   Ct a/p reviewed --  internal external biliary drain appears well positioned + unchanged  Perc choly tube Acute pancreatits Gastric ileus  L pleural effusion Ascites   -still awaiting transfer to ICU when bed available -needs NGT placement for gastric decompression -Continue NE for MAP > 65  -Continue to monitor respiratory status-- on NRB currently and protecting airway but certainly at risk for requiring ETT   I discussed updates and plan with pt wife, all questions answered  Eliseo Gum MSN, AGACNP-BC [place] [phone] If no answer, [phone] 05/31/2020, 4:47 PM

## 2020-06-01 ENCOUNTER — Inpatient Hospital Stay (HOSPITAL_COMMUNITY): Payer: 59

## 2020-06-01 DIAGNOSIS — K8581 Other acute pancreatitis with uninfected necrosis: Secondary | ICD-10-CM

## 2020-06-01 LAB — CBC WITH DIFFERENTIAL/PLATELET
Basophils Absolute: 0 10*3/uL (ref 0.0–0.1)
Basophils Relative: 0 %
Eosinophils Absolute: 0.4 10*3/uL (ref 0.0–0.5)
Eosinophils Relative: 1 %
HCT: 27.9 % — ABNORMAL LOW (ref 39.0–52.0)
Hemoglobin: 7.8 g/dL — ABNORMAL LOW (ref 13.0–17.0)
Lymphocytes Relative: 4 %
Lymphs Abs: 1.7 10*3/uL (ref 0.7–4.0)
MCH: 26.8 pg (ref 26.0–34.0)
MCHC: 28 g/dL — ABNORMAL LOW (ref 30.0–36.0)
MCV: 95.9 fL (ref 80.0–100.0)
Monocytes Absolute: 0.8 10*3/uL (ref 0.1–1.0)
Monocytes Relative: 2 %
Neutro Abs: 39.2 10*3/uL — ABNORMAL HIGH (ref 1.7–7.7)
Neutrophils Relative %: 93 %
Platelets: 296 10*3/uL (ref 150–400)
RBC: 2.91 MIL/uL — ABNORMAL LOW (ref 4.22–5.81)
RDW: 16 % — ABNORMAL HIGH (ref 11.5–15.5)
WBC Morphology: INCREASED
WBC: 42.2 10*3/uL — ABNORMAL HIGH (ref 4.0–10.5)
nRBC: 0 /100 WBC
nRBC: 0.2 % (ref 0.0–0.2)

## 2020-06-01 LAB — POTASSIUM: Potassium: 4.6 mmol/L (ref 3.5–5.1)

## 2020-06-01 LAB — GLUCOSE, CAPILLARY
Glucose-Capillary: 135 mg/dL — ABNORMAL HIGH (ref 70–99)
Glucose-Capillary: 136 mg/dL — ABNORMAL HIGH (ref 70–99)
Glucose-Capillary: 170 mg/dL — ABNORMAL HIGH (ref 70–99)
Glucose-Capillary: 175 mg/dL — ABNORMAL HIGH (ref 70–99)
Glucose-Capillary: 210 mg/dL — ABNORMAL HIGH (ref 70–99)
Glucose-Capillary: 251 mg/dL — ABNORMAL HIGH (ref 70–99)

## 2020-06-01 LAB — BASIC METABOLIC PANEL
Anion gap: 10 (ref 5–15)
BUN: 51 mg/dL — ABNORMAL HIGH (ref 8–23)
CO2: 21 mmol/L — ABNORMAL LOW (ref 22–32)
Calcium: 8.2 mg/dL — ABNORMAL LOW (ref 8.9–10.3)
Chloride: 114 mmol/L — ABNORMAL HIGH (ref 98–111)
Creatinine, Ser: 1.23 mg/dL (ref 0.61–1.24)
GFR calc Af Amer: >60 mL/min (ref >60)
GFR calc non Af Amer: >60 mL/min (ref >60)
Glucose, Bld: 209 mg/dL — ABNORMAL HIGH (ref 70–99)
Potassium: 5.3 mmol/L — ABNORMAL HIGH (ref 3.5–5.1)
Sodium: 145 mmol/L (ref 135–145)

## 2020-06-01 LAB — LACTIC ACID, PLASMA
Lactic Acid, Venous: 2.9 mmol/L (ref 0.5–1.9)
Lactic Acid, Venous: 3.5 mmol/L (ref 0.5–1.9)
Lactic Acid, Venous: 2.8 mmol/L (ref 0.5–1.9)

## 2020-06-01 LAB — ABO/RH: ABO/RH(D): O POS

## 2020-06-01 LAB — HEPATIC FUNCTION PANEL
ALT: 39 U/L (ref 0–44)
AST: 48 U/L — ABNORMAL HIGH (ref 15–41)
Albumin: 1.7 g/dL — ABNORMAL LOW (ref 3.5–5.0)
Alkaline Phosphatase: 318 U/L — ABNORMAL HIGH (ref 38–126)
Bilirubin, Direct: 0.6 mg/dL — ABNORMAL HIGH (ref 0.0–0.2)
Indirect Bilirubin: 0.6 mg/dL (ref 0.3–0.9)
Total Bilirubin: 1.2 mg/dL (ref 0.3–1.2)
Total Protein: 6 g/dL — ABNORMAL LOW (ref 6.5–8.1)

## 2020-06-01 LAB — PROCALCITONIN: Procalcitonin: 30.8 ng/mL

## 2020-06-01 MED ORDER — ZOLPIDEM TARTRATE 5 MG PO TABS
5.0000 mg | ORAL_TABLET | Freq: Every day | ORAL | Status: DC
  Administered 2020-06-01 – 2020-06-03 (×3): 5 mg
  Filled 2020-06-01 (×3): qty 1

## 2020-06-01 MED ORDER — ACETAMINOPHEN 325 MG PO TABS
650.0000 mg | ORAL_TABLET | Freq: Four times a day (QID) | ORAL | Status: DC
  Administered 2020-06-01 – 2020-06-04 (×12): 650 mg
  Filled 2020-06-01 (×12): qty 2

## 2020-06-01 MED ORDER — PIPERACILLIN-TAZOBACTAM 3.375 G IVPB
3.3750 g | Freq: Three times a day (TID) | INTRAVENOUS | Status: DC
  Administered 2020-06-01 – 2020-06-02 (×3): 3.375 g via INTRAVENOUS
  Filled 2020-06-01 (×2): qty 50

## 2020-06-01 MED ORDER — ZOLPIDEM TARTRATE 5 MG PO TABS: 5.0000 mg | ORAL_TABLET | Freq: Every day | ORAL | Status: DC

## 2020-06-01 MED ORDER — TRAZODONE HCL 50 MG PO TABS: 50.0000 mg | ORAL_TABLET | Freq: Every day | ORAL | Status: DC

## 2020-06-01 MED ORDER — OXYCODONE HCL 5 MG PO TABS
5.0000 mg | ORAL_TABLET | ORAL | Status: DC | PRN
  Administered 2020-06-02 – 2020-06-03 (×3): 5 mg
  Filled 2020-06-01 (×4): qty 1

## 2020-06-01 MED ORDER — PRAVASTATIN SODIUM 10 MG PO TABS
20.0000 mg | ORAL_TABLET | Freq: Every day | ORAL | Status: DC
  Administered 2020-06-01 – 2020-06-03 (×3): 20 mg
  Filled 2020-06-01 (×3): qty 2

## 2020-06-01 MED ORDER — QUETIAPINE FUMARATE 25 MG PO TABS: 25.0000 mg | ORAL_TABLET | Freq: Every day | ORAL | Status: DC

## 2020-06-01 MED ORDER — SERTRALINE HCL 50 MG PO TABS
100.0000 mg | ORAL_TABLET | Freq: Every day | ORAL | Status: DC
  Administered 2020-06-02 – 2020-06-03 (×2): 100 mg
  Filled 2020-06-01 (×2): qty 2

## 2020-06-01 MED ORDER — TRAVASOL 10 % IV SOLN
INTRAVENOUS | Status: AC
  Filled 2020-06-01: qty 1320

## 2020-06-01 NOTE — Progress Notes (Signed)
OT Cancellation Note  Patient Details Name: Jiraiya Mcewan MRN: [id number] DOB: [birth year]   Cancelled Treatment:    Reason Eval/Treat Not Completed: Patient not medically ready. Pt with medical decline. Waiting for ICU bed. Will follow up later date.   Ramond Dial, OT/L   Acute OT Clinical Specialist Acute Rehabilitation Services Pager [phone] Office [phone]  06/01/2020, 8:37 AM

## 2020-06-01 NOTE — Progress Notes (Signed)
PT Cancellation Note  Patient Details Name: Bush Murdoch MRN: [id number] DOB: 1956-06-05   Cancelled Treatment:    Reason Eval/Treat Not Completed: Medical issues which prohibited therapy. Decline in respiratory status requiring transfer to ICU. Pt now on 8L HFNC. Hold PT today per RN. PT will continue to follow.   Lorriane Shire 06/01/2020, 10:23 AM   Lorrin Goodell, PT  Office # [phone] Pager [phone]

## 2020-06-01 NOTE — Progress Notes (Signed)
Subjective: NG tube replaced yesterday evening. Tachycardia improved, levophed weaned to 10. Patient remains on nonrebreather. WBC up to 42 today, but bilirubin is down to 1.2. Gallbladder perc drain had minimal output overnight.  Objective: Vital signs in last 24 hours: Temp:  [97 F (36.1 C)-99.7 F (37.6 C)] 97 F (36.1 C) (09/24 0400) Pulse Rate:  [32-143] 92 (09/24 0600) Resp:  [11-32] 16 (09/24 0600) BP: (63-134)/(35-116) 108/76 (09/24 0600) SpO2:  [74 %-100 %] 100 % (09/24 0600) FiO2 (%):  [100 %] 100 % (09/23 1320) Weight:  [83.9 kg] 83.9 kg (09/24 0500) Last BM Date: 05/28/20  Intake/Output from previous day: 09/23 0701 - 09/24 0700 In: 3529.6 [I.V.:3529.6] Out: 2830 [Urine:1100; Emesis/NG output:1500; Drains:230] Intake/Output this shift: No intake/output data recorded.  PE: General: resting comfortably, NAD Neuro: alert and interactive but mildly confused HEENT: NG tube in place, draining gastric contents Resp: mildly increased work of breathing, on nonrebreather mask Abdomen: distended but compressible, RUQ PBD with bilious fluid in bag. Percutaneous drain with scant serous fluid.  Lab Results:  Recent Labs    05/31/20 0458 05/31/20 1327  WBC 26.2* 15.6*  HGB 8.2* 8.6*  HCT 28.6* 30.8*  PLT 311 290   BMET Recent Labs    05/31/20 1327 06/01/20 0516  NA 145 145  K 4.2 5.3*  CL 115* 114*  CO2 18* 21*  GLUCOSE 69* 209*  BUN 52* 51*  CREATININE 1.39* 1.23  CALCIUM 8.6* 8.2*   PT/INR Recent Labs    05/31/20 1415  LABPROT 17.7*  INR 1.5*   CMP     Component Value Date/Time   NA 145 06/01/2020 0516   K 5.3 (H) 06/01/2020 0516   CL 114 (H) 06/01/2020 0516   CO2 21 (L) 06/01/2020 0516   GLUCOSE 209 (H) 06/01/2020 0516   BUN 51 (H) 06/01/2020 0516   CREATININE 1.23 06/01/2020 0516   CALCIUM 8.2 (L) 06/01/2020 0516   PROT 5.5 (L) 05/31/2020 1327   ALBUMIN 1.7 (L) 05/31/2020 1327   AST 32 05/31/2020 1327   ALT 33 05/31/2020 1327    ALKPHOS 472 (H) 05/31/2020 1327   BILITOT 2.4 (H) 05/31/2020 1327   GFRNONAA >60 06/01/2020 0516   GFRAA >60 06/01/2020 0516   Lipase     Component Value Date/Time   LIPASE 27 05/16/2020 1905       Studies/Results: DG Abd 1 View  Result Date: 05/31/2020 CLINICAL DATA:  Check gastric catheter placement EXAM: ABDOMEN - 1 VIEW COMPARISON:  CT from earlier in the same day. FINDINGS: Scattered large and small bowel gas is noted. Cholecystostomy tube as well as internal external biliary drain is noted with a CBD stent in place. Gastric catheter is coiled within the stomach. No other focal abnormality is noted. IMPRESSION: Gastric catheter coiled within the stomach. Electronically Signed   By: Inez Catalina M.D.   On: 05/31/2020 20:42   DG Abd 1 View  Result Date: 05/31/2020 CLINICAL DATA:  Abdominal distension. EXAM: ABDOMEN - 1 VIEW COMPARISON:  May 27, 2020. FINDINGS: The bowel gas pattern is normal. Internal external biliary drain is noted passing through biliary stent, with distal tip in expected position of the distal duodenum. IMPRESSION: No evidence of bowel obstruction or ileus. Internal external biliary drain is noted. Electronically Signed   By: Marijo Conception M.D.   On: 05/31/2020 14:44   CT ABDOMEN PELVIS W CONTRAST  Result Date: 05/31/2020 CLINICAL DATA:  Pancreatitis EXAM: CT ABDOMEN AND PELVIS WITH  CONTRAST TECHNIQUE: Multidetector CT imaging of the abdomen and pelvis was performed using the standard protocol following bolus administration of intravenous contrast. CONTRAST:  135mL OMNIPAQUE IOHEXOL 300 MG/ML  SOLN COMPARISON:  05/26/2020 FINDINGS: Lower chest: There is a moderate-sized left-sided pleural effusion that is only partially visualized. There is at least partial collapse of the left lower lobe. There are new coarse airspace opacities in the lingula and right lung base concerning infiltrates or aspiration. There is a small right-sided pleural effusion. The Ace off a  Gus is dilated and fluid-filled. Hepatobiliary: There is an internal external biliary drain coursing through the patient's right hepatic lobe. The drain appears to be well position. Again noted is a percutaneous cholecystostomy tube. The gallbladder is decompressed.A common bile duct stent is again noted. There is minimal intrahepatic biliary ductal dilatation. Pancreas: Again noted are findings of acute pancreatitis. There are persistent peripancreatic fluid collections which have changed minimally in size since the prior study. 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 Spleen: Unremarkable. Adrenals/Urinary Tract: --Adrenal glands: Unremarkable. --Right kidney/ureter: Are nonobstructing right-sided nephroliths. --Left kidney/ureter: There is a nonobstructing stone in the upper pole the left kidney. --Urinary bladder: The urinary bladder is decompressed and therefore is poorly evaluated. Stomach/Bowel: --Stomach/Duodenum: The stomach is significantly distended and contains an air-fluid level. There is asymmetric wall thickening of the lesser curvature of the stomach, likely reactive. --Small bowel: Unremarkable. --Colon: There are few scattered colonic diverticula without CT evidence for diverticulitis. There is some wall thickening of the splenic and hepatic flexures of the colon, presumably reactive in etiology. --Appendix: Normal. Vascular/Lymphatic: Atherosclerotic calcification is present within the non-aneurysmal abdominal aorta, without hemodynamically significant stenosis. The splenic artery appears to be patent. There appears to be moderate attenuation of the splenic vein and portal venous confluence as well as the proximal main portal vein. --No retroperitoneal lymphadenopathy. --No mesenteric lymphadenopathy. --No pelvic or inguinal lymphadenopathy. Reproductive: Unremarkable Other: Again noted is a small to moderate volume of abdominal ascites. There  is body wall edema, slightly progressed from prior study. Musculoskeletal. No acute displaced fractures. IMPRESSION: 1. Again noted are findings of acute pancreatitis with relatively stable peripancreatic fluid collections. 2. Findings consistent with a gastric ileus. The esophagus is dilated and fluid-filled to at least the mid thorax. This patient would likely benefit from NG tube decompression. 3. Worsening airspace opacities at the lung bases concerning for developing infiltrates or aspiration. 4. Moderate-sized left-sided pleural effusion with at least partial collapse of the left lower lobe. There is a trace to small right-sided pleural effusion with mild adjacent atelectasis. 5. Stable small volume abdominal ascites. 6. Well-positioned internal/external biliary drain. Unchanged cholecystostomy tube. Aortic Atherosclerosis (ICD10-I70.0). Electronically Signed   By: Constance Holster M.D.   On: 05/31/2020 16:04   DG CHEST PORT 1 VIEW  Result Date: 06/01/2020 CLINICAL DATA:  Shortness of breath. EXAM: PORTABLE CHEST 1 VIEW FINDING: FINDING Mild enlargement the cardiac silhouette, which is accentuated by low lung volumes and portable technique. Similar appearance of low lung volumes with left basilar opacity. Small left pleural effusion. No discernible pneumothorax. Gastric tube courses below the diaphragm, coiled in the stomach. Right-sided PICC with the tip projecting at the superior cavoatrial junction. External device overlying the left chest, possibly a suction catheter. IMPRESSION: Similar appearance of a small left pleural effusion with overlying opacity representing either atelectasis, aspiration, and/or pneumonia. Electronically Signed   By: Margaretha Sheffield MD   On: 06/01/2020 08:23  DG CHEST PORT 1 VIEW  Result Date: 05/31/2020 CLINICAL DATA:  Hypoxemia. EXAM: PORTABLE CHEST 1 VIEW COMPARISON:  May 27, 2020. FINDINGS: Stable cardiomediastinal silhouette. No pneumothorax is noted.  Stable bibasilar opacities are noted concerning for atelectasis or infiltrates. Small left pleural effusion is noted. Right PICC line is unchanged. Bony thorax is unremarkable. IMPRESSION: Stable bibasilar atelectasis or infiltrates. Small left pleural effusion is noted. Electronically Signed   By: Marijo Conception M.D.   On: 05/31/2020 14:43    Anti-infectives: Anti-infectives (From admission, onward)   Start     Dose/Rate Route Frequency Ordered Stop   05/29/20 0800  piperacillin-tazobactam (ZOSYN) IVPB 3.375 g        3.375 g 12.5 mL/hr over 240 Minutes Intravenous To Radiology 05/28/20 1328 05/29/20 1943   05/04/20 1230  meropenem (MERREM) 1 g in sodium chloride 0.9 % 100 mL IVPB  Status:  Discontinued        1 g 200 mL/hr over 30 Minutes Intravenous Every 8 hours 05/04/20 1156 05/19/20 0826   05/01/20 1400  piperacillin-tazobactam (ZOSYN) IVPB 3.375 g        3.375 g 12.5 mL/hr over 240 Minutes Intravenous Every 8 hours 05/01/20 1013 05/03/20 0907   04/29/20 2000  vancomycin (VANCOCIN) IVPB 1000 mg/200 mL premix  Status:  Discontinued        1,000 mg 200 mL/hr over 60 Minutes Intravenous Every 12 hours 04/29/20 0653 04/30/20 0831   04/29/20 0745  vancomycin (VANCOREADY) IVPB 1500 mg/300 mL        1,500 mg 150 mL/hr over 120 Minutes Intravenous  Once 04/29/20 0653 04/29/20 1129   04/26/20 2300  piperacillin-tazobactam (ZOSYN) IVPB 3.375 g        3.375 g 12.5 mL/hr over 240 Minutes Intravenous Every 8 hours 04/26/20 1716 05/01/20 0559   04/26/20 1700  piperacillin-tazobactam (ZOSYN) IVPB 3.375 g        3.375 g 100 mL/hr over 30 Minutes Intravenous  Once 04/26/20 1655 04/26/20 1844       Assessment/Plan 64 yo male s/p subtotal cholecystectomy, complicated by persistent bile leak, 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 S/p PTBD. - Keep biliary drain externalized until patient clinically improves. Trend daily LFTs. Suspect there is a  source of sepsis in addition to cholangitis, as PBD was only capped for a few hours and the WBC continues to rise with near normal bilirubin. - Agree with antibiotics for sepsis. No evidence of infected peripancreatic necrosis on CT scan from yesterday. - Remain NPO, keep NG tube in place. When patient is having regular bowel function, recommend attempting placement of an [place] feeding tube to trial enteral feeds. Please hold miralax given gastric outlet obstruction. Continue TPN for now. - Optimize nutrition - continue TPN - Surgery will continue to follow   LOS: 35 days    Michaelle Birks, MD [facility] Surgery General, Hepatobiliary and Pancreatic Surgery 06/01/20 8:39 AM

## 2020-06-01 NOTE — Progress Notes (Signed)
PHARMACY - TOTAL PARENTERAL NUTRITION CONSULT NOTE  Indication:  Severe pancreatitis, biliary fistula  Patient Measurements: Height: 5' 9"  (175.3 cm) Weight: 83.9 kg (184 lb 15.5 oz) IBW/kg (Calculated) : 70.7 TPN AdjBW (KG): 86.1 Body mass index is 27.31 kg/m.  Assessment:  [name] admitted 04/26/20, post-ERCP 8/18 and biliary stent exchange for persistent postoperative bile leak related to recent cholecystectomy with abscess s/p percutaneous drainage. Patient developed acute pancreatitis, has had a prolonged hospitalization complicated by VDRF (extubated 8/24), AKI, encephalopathy, and afib. TPN initiated 9/11 for acute post-ERCP pancreatitis/ileus and persistent biliary leak. Significant gastric fluid retention from functional outlet obstruction. Pt still with very poor PO intake.  9/23: Patient acutely decompensated requiring pressors and non-rebreather. Patient now in septic shock.    Glucose / Insulin: A1c 5.6% - CBGs 200s. Lowest off TPN 69. 14 units mSSI/24 hrs, 40 units regular insulin in TPN.  Electrolytes: Na 145 high-normal. K 5.3 Cl 114 CO2 21  9/23- Mag 2.4 high normal  Renal: SCr 1.23 (up), BUN 51 (up).  LFTs / TGs: 9/23 labs- LFTs/Tbili WNL. Alk phos 313 (up), TG 162 Prealbumin / albumin: Prealbumin 8.2 (up). Albumin 1.3 (down) Intake / Output; MIVF: UOP 0.46m/kg/hr. NGT 15051m24hrs. Drain 23057m4hrs. I/O +700m42mhrs. LBM 9/20 MIVF LR 50mL52mGI Imaging:  9/5: CT - severe pancreatitis 9/7: HIDA - persistent bile leak with accumulations/paracolic gutter 9/9: CT - pancreatitis (peripancreatic fat stranding and peripancreatic fluid, percutaneous drain at the gallbladder fossa, unchanged biliary stent with no intrahepatic biliary duct dilatation, bilateral pleural effusions 9/15: KUB - diffuse colonic stool, no bowel obstruction/free air 9/18: CT abd/pelvis - mild colonic diverticulosis, peripancreatic fluid indicative of pseudocyst involvement and less likely abscess,  otherwise unchanged  9/23: KUB - no evidence of bowel obstruction/ileus 9/23: CT - Gastric ileus, dilated/fluid-filled esophagus, small volume abd ascites Surgeries / Procedures: 8/18: ERCP/biliary stent exchange 9/9: Unsuccessful ERCP 9/10: Paracentesis - 1.4L out 9/21: EGD- Esophagitis w/ active oozing and stigmata of recent bleeding, gastritis w/ hemorrhage, congested duodenal mucosa; NGT trauma  9/21: Paracentesis/biliary drain placement/cholangiogram/drain check- 3L removed  Central access: PICC placed 05/19/20 TPN start date: 05/19/20  Nutritional Goals (per RD rec on 05/18/20): kCal: 2300-2500, Protein: 130-145g, Fluid: >2.3L/day  Current Nutrition: TPN  Plan:  Adjust to 24 hr TPN at 1800 with a goal rate of 100mL/71m TPN will provide 132g AA, 336g CHO and 67g ILE for a total of 2342 kCal, meeting 100% of needs  Electrolytes in TPN: Na 60mEq/39memove K/Ca/Mag, and Phos 4mmol/L43ml:Ac 1:2 Add standard MVI and trace elements to TPN  Continue rSSI q4h + decrease to 30 units insulin in TPN  -Decrease in TPN d/t CBGs previously controlled on continuous TPN F/u AM labs, CBGs  Adam Fowler Adam Fowler Candidate  06/01/2020 10:12 AM

## 2020-06-01 NOTE — Progress Notes (Deleted)
PHARMACY - TOTAL PARENTERAL NUTRITION CONSULT NOTE  Indication:  Severe pancreatitis, biliary fistula  Patient Measurements: Height: 5' 9"  (175.3 cm) Weight: 83.9 kg (184 lb 15.5 oz) IBW/kg (Calculated) : 70.7 TPN AdjBW (KG): 86.1 Body mass index is 27.31 kg/m.  Assessment:  4 YOM admitted 04/26/20, post-ERCP 8/18 and biliary stent exchange for persistent postoperative bile leak related to recent cholecystectomy with abscess s/p percutaneous drainage. Patient developed acute pancreatitis, has had a prolonged hospitalization complicated by VDRF (extubated 8/24), AKI, encephalopathy, and afib. TPN initiated 9/11 for acute post-ERCP pancreatitis/ileus and persistent biliary leak. Significant gastric fluid retention from functional outlet obstruction. Pt still with very poor PO intake.  9/23: Patient acutely decompensated requiring pressors and non-rebreather. Patient now in septic shock.    Glucose / Insulin: A1c 5.6% - CBGs 200s. Lowest off TPN 69. 14 units mSSI/24 hrs, 40 units regular insulin in TPN.  Electrolytes: Na 145 high-normal. K 5.3 Cl 114 CO2 21  9/23- Mag 2.4 high normal  Renal: SCr 1.23 (up), BUN 51 (up).  LFTs / TGs: 9/23 labs- LFTs/Tbili WNL. Alk phos 313 (up), TG 162 Prealbumin / albumin: Prealbumin 8.2 (up). Albumin 1.3 (down) Intake / Output; MIVF: UOP 0.57m/kg/hr. NGT 15020m24hrs. Drain 23065m4hrs. I/O +700m77mhrs. LBM 9/20 MIVF LR 50mL21mGI Imaging:  9/5: CT - severe pancreatitis 9/7: HIDA - persistent bile leak with accumulations/paracolic gutter 9/9: CT - pancreatitis (peripancreatic fat stranding and peripancreatic fluid, percutaneous drain at the gallbladder fossa, unchanged biliary stent with no intrahepatic biliary duct dilatation, bilateral pleural effusions 9/15: KUB - diffuse colonic stool, no bowel obstruction/free air 9/18: CT abd/pelvis - mild colonic diverticulosis, peripancreatic fluid indicative of pseudocyst involvement and less likely abscess,  otherwise unchanged  9/23: KUB - no evidence of bowel obstruction/ileus 9/23: CT - Gastric ileus, dilated/fluid-filled esophagus, small volume abd ascites Surgeries / Procedures: 8/18: ERCP/biliary stent exchange 9/9: Unsuccessful ERCP 9/10: Paracentesis - 1.4L out 9/21: EGD- Esophagitis w/ active oozing and stigmata of recent bleeding, gastritis w/ hemorrhage, congested duodenal mucosa; NGT trauma  9/21: Paracentesis/biliary drain placement/cholangiogram/drain check- 3L removed  Central access: PICC placed 05/19/20 TPN start date: 05/19/20  Nutritional Goals (per RD rec on 05/18/20): kCal: 2300-2500, Protein: 130-145g, Fluid: >2.3L/day  Current Nutrition: TPN  Plan:  Adjust to 24 hr TPN at 1800 with a goal rate of 100mL/44m TPN will provide 132g AA, 336g CHO and 67g ILE for a total of 2342 kCal, meeting 100% of needs  Electrolytes in TPN: Na 60mEq/57memove K/Ca/Mag, and Phos 4mmol/L75ml:Ac 1:2 Add standard MVI and trace elements to TPN  Continue rSSI q4h + decrease to 20 units insulin in TPN  -Decrease in TPN d/t CBGs previously controlled on continuous TPN F/u AM labs, CBGs  Adam Fowler Candidate  06/01/2020 7:09 AM

## 2020-06-01 NOTE — Progress Notes (Signed)
Referring Physician(s): Dr. Zenia Resides  Supervising Physician: Jacqulynn Cadet  Patient Status:  [facility] - In-pt  Chief Complaint: High-output biliary fistula s/p cholecystectomy s/p biloma drain placement 6/7 Post-ERCP pancreatitis with gastric outlet obstruction S/p common bile duct stent placement, but with continued increased biloma drain output S/p internal/external biliary drain placement 9/21  Subjective: No further acute events overnight.   Weaning from epi, now on lower dosing with adequate BP control.  Intermittently confused.  Alert during my visit today.  Communicative and helpful, but does have moments of fleeting disorientation.  Lactic acid 5.3 yesterday, improved to 2.8 Tbili also improved.  Profound increase in WBC overnight.   Allergies: Toradol [ketorolac tromethamine]  Medications: Prior to Admission medications   Medication Sig Start Date End Date Taking? Authorizing Provider  Calcium Carb-Cholecalciferol (CALCIUM+D3 PO) Take 1 tablet by mouth daily.   Yes [provider]  Coenzyme Q10 (COQ10) 400 MG CAPS Take 800 mg by mouth in the morning.    Yes [provider]  Cyanocobalamin (VITAMIN B-12) 5000 MCG TBDP Take 5,000 mcg by mouth daily.   Yes [provider]  GLUCOSAMINE-CHONDROITIN PO Take 1 tablet by mouth in the morning and at bedtime.    Yes [provider]  Javier Docker Oil 1000 MG CAPS Take 1,000 mg by mouth in the morning and at bedtime.    Yes [provider]  lisinopril-hydrochlorothiazide (ZESTORETIC) 10-12.5 MG tablet Take 1 tablet by mouth daily. 03/04/20  Yes [provider]  Moringa Oleifera (MORINGA PO) Take 1,000 mg by mouth 2 (two) times daily.   Yes [provider]  Multiple Vitamin (MULTIVITAMIN WITH MINERALS) TABS tablet Take 1 tablet by mouth daily.   Yes [provider]  ondansetron (ZOFRAN) 4 MG tablet Take 1 tablet (4 mg total) by mouth every 8 (eight) hours as needed for  nausea or vomiting. 04/25/20  Yes Mansouraty, Telford Nab., MD  OVER THE COUNTER MEDICATION Take 1 tablet by mouth See admin instructions. [facility] otc tablet- Take 1 tablet by mouth once a day   Yes [provider]  oxyCODONE (OXY IR/ROXICODONE) 5 MG immediate release tablet Take 5 mg by mouth 2 (two) times daily as needed (for pain).  04/25/20  Yes [provider]  pravastatin (PRAVACHOL) 80 MG tablet Take 40 mg by mouth in the morning and at bedtime.   Yes [provider]  Red Yeast Rice 600 MG CAPS Take 1,200 mg by mouth at bedtime.    Yes [provider]  sertraline (ZOLOFT) 100 MG tablet Take 100 mg by mouth daily. 11/08/19  Yes [provider]  TURMERIC PO Take 1,500 mg by mouth 2 (two) times daily.   Yes [provider]  zolpidem (AMBIEN) 5 MG tablet Take 5 mg by mouth at bedtime. 12/29/19  Yes [provider]  ibuprofen (ADVIL) 200 MG tablet Take 400-600 mg by mouth every 6 (six) hours as needed for headache or moderate pain.    [provider]     Vital Signs: BP (!) 121/91   Pulse 94   Temp (!) 96.4 F (35.8 C) (Axillary)   Resp 18   Ht 5\' 9"  (1.753 m)   Wt 184 lb 15.5 oz (83.9 kg)   SpO2 100%   BMI 27.31 kg/m   Physical Exam  NAD, alert Abdomen: Anterior drain (biloma drain) in place. Insertion site c/d/i.  Non-bloody, thin, serous-appearing fluid.  Posterior (internal/external biliary drain) in place. Insertion site c/d/i.  Dark,  bilious output in collection bag.   Imaging: DG Abd 1 View  Result Date: 05/31/2020 CLINICAL DATA:  Check gastric catheter placement EXAM: ABDOMEN - 1 VIEW COMPARISON:  CT from earlier in the same day. FINDINGS: Scattered large and small bowel gas is noted. Cholecystostomy tube as well as internal external biliary drain is noted with a CBD stent in place. Gastric catheter is coiled within the stomach. No other focal abnormality is noted. IMPRESSION: Gastric catheter coiled within  the stomach. Electronically Signed   By: Inez Catalina M.D.   On: 05/31/2020 20:42   DG Abd 1 View  Result Date: 05/31/2020 CLINICAL DATA:  Abdominal distension. EXAM: ABDOMEN - 1 VIEW COMPARISON:  May 27, 2020. FINDINGS: The bowel gas pattern is normal. Internal external biliary drain is noted passing through biliary stent, with distal tip in expected position of the distal duodenum. IMPRESSION: No evidence of bowel obstruction or ileus. Internal external biliary drain is noted. Electronically Signed   By: Marijo Conception M.D.   On: 05/31/2020 14:44   CT ABDOMEN PELVIS W CONTRAST  Result Date: 05/31/2020 CLINICAL DATA:  Pancreatitis EXAM: CT ABDOMEN AND PELVIS WITH CONTRAST TECHNIQUE: Multidetector CT imaging of the abdomen and pelvis was performed using the standard protocol following bolus administration of intravenous contrast. CONTRAST:  135mL OMNIPAQUE IOHEXOL 300 MG/ML  SOLN COMPARISON:  05/26/2020 FINDINGS: Lower chest: There is a moderate-sized left-sided pleural effusion that is only partially visualized. There is at least partial collapse of the left lower lobe. There are new coarse airspace opacities in the lingula and right lung base concerning infiltrates or aspiration. There is a small right-sided pleural effusion. The Ace off a Gus is dilated and fluid-filled. Hepatobiliary: There is an internal external biliary drain coursing through the patient's right hepatic lobe. The drain appears to be well position. Again noted is a percutaneous cholecystostomy tube. The gallbladder is decompressed.A common bile duct stent is again noted. There is minimal intrahepatic biliary ductal dilatation. Pancreas: Again noted are findings of acute pancreatitis. There are persistent peripancreatic fluid collections which have changed minimally in size since the prior study. 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 Spleen: Unremarkable. Adrenals/Urinary  Tract: --Adrenal glands: Unremarkable. --Right kidney/ureter: Are nonobstructing right-sided nephroliths. --Left kidney/ureter: There is a nonobstructing stone in the upper pole the left kidney. --Urinary bladder: The urinary bladder is decompressed and therefore is poorly evaluated. Stomach/Bowel: --Stomach/Duodenum: The stomach is significantly distended and contains an air-fluid level. There is asymmetric wall thickening of the lesser curvature of the stomach, likely reactive. --Small bowel: Unremarkable. --Colon: There are few scattered colonic diverticula without CT evidence for diverticulitis. There is some wall thickening of the splenic and hepatic flexures of the colon, presumably reactive in etiology. --Appendix: Normal. Vascular/Lymphatic: Atherosclerotic calcification is present within the non-aneurysmal abdominal aorta, without hemodynamically significant stenosis. The splenic artery appears to be patent. There appears to be moderate attenuation of the splenic vein and portal venous confluence as well as the proximal main portal vein. --No retroperitoneal lymphadenopathy. --No mesenteric lymphadenopathy. --No pelvic or inguinal lymphadenopathy. Reproductive: Unremarkable Other: Again noted is a small to moderate volume of abdominal ascites. There is body wall edema, slightly progressed from prior study. Musculoskeletal. No acute displaced fractures. IMPRESSION: 1. Again noted are findings of acute pancreatitis with relatively stable peripancreatic fluid collections. 2. Findings consistent with a gastric ileus. The esophagus is dilated and fluid-filled to at least the mid thorax. This patient would likely  benefit from NG tube decompression. 3. Worsening airspace opacities at the lung bases concerning for developing infiltrates or aspiration. 4. Moderate-sized left-sided pleural effusion with at least partial collapse of the left lower lobe. There is a trace to small right-sided pleural effusion with mild  adjacent atelectasis. 5. Stable small volume abdominal ascites. 6. Well-positioned internal/external biliary drain. Unchanged cholecystostomy tube. Aortic Atherosclerosis (ICD10-I70.0). Electronically Signed   By: Constance Holster M.D.   On: 05/31/2020 16:04   DG CHEST PORT 1 VIEW  Result Date: 06/01/2020 CLINICAL DATA:  Shortness of breath. EXAM: PORTABLE CHEST 1 VIEW FINDING: FINDING Mild enlargement the cardiac silhouette, which is accentuated by low lung volumes and portable technique. Similar appearance of low lung volumes with left basilar opacity. Small left pleural effusion. No discernible pneumothorax. Gastric tube courses below the diaphragm, coiled in the stomach. Right-sided PICC with the tip projecting at the superior cavoatrial junction. External device overlying the left chest, possibly a suction catheter. IMPRESSION: Similar appearance of a small left pleural effusion with overlying opacity representing either atelectasis, aspiration, and/or pneumonia. Electronically Signed   By: Margaretha Sheffield MD   On: 06/01/2020 08:23   DG CHEST PORT 1 VIEW  Result Date: 05/31/2020 CLINICAL DATA:  Hypoxemia. EXAM: PORTABLE CHEST 1 VIEW COMPARISON:  May 27, 2020. FINDINGS: Stable cardiomediastinal silhouette. No pneumothorax is noted. Stable bibasilar opacities are noted concerning for atelectasis or infiltrates. Small left pleural effusion is noted. Right PICC line is unchanged. Bony thorax is unremarkable. IMPRESSION: Stable bibasilar atelectasis or infiltrates. Small left pleural effusion is noted. Electronically Signed   By: Marijo Conception M.D.   On: 05/31/2020 14:43   DG C-Arm 1-60 Min-No Report  Result Date: 05/29/2020 Fluoroscopy was utilized by the requesting physician.  No radiographic interpretation.   IR CHOLANGIOGRAM EXISTING TUBE  Result Date: 05/30/2020 INDICATION: 64 year old male with history of complex postoperative course after subtotal cholecystectomy for acute  gangrenous cholecystitis in May 2706 complicated by biloma formation requiring percutaneous drainage on 02/13/2020 and placement of metallic common bile duct stent on 04/26/2020. Most recently, the patient has developed pancreatitis and duodenitis with ERCP findings consistent with obstruction of the distal common bile duct stent and subsequently high output from the biloma drain. Percutaneous cholangiogram and internal external biliary drain placement is requested for decompression and management of biliary fluid losses. EXAM: 1. Ultrasound-guided paracentesis 2. Cholangiogram via indwelling gallbladder fossa drain 3. Percutaneous cholangiogram 4. Placement of right-sided internal external biliary drain MEDICATIONS: Zosyn 3.375 g; The antibiotic was administered within an appropriate time frame prior to the initiation of the procedure. ANESTHESIA/SEDATION: Moderate (conscious) sedation was employed during this procedure. A total of Versed 2 mg and Fentanyl 75 mcg was administered intravenously. Moderate Sedation Time: 55 minutes. The patient's level of consciousness and vital signs were monitored continuously by radiology nursing throughout the procedure under my direct supervision. FLUOROSCOPY TIME:  Fluoroscopy Time: 17 minutes 18 seconds (81 mGy). COMPLICATIONS: None immediate. PROCEDURE: Informed written consent was obtained from the patient after a thorough discussion of the procedural risks, benefits and alternatives. All questions were addressed. Maximal Sterile Barrier Technique was utilized including caps, mask, sterile gowns, sterile gloves, sterile drape, hand hygiene and skin antiseptic. A timeout was performed prior to the initiation of the procedure. Preprocedure ultrasound demonstrated moderate to large volume ascites, including in the perihepatic space. The largest fluid pocket visualized in the right lower quadrant. Therefore the right lower quadrant was prepped and draped in standard fashion. The  puncture site  was planned. Subdermal Local anesthesia was provided with 1% lidocaine with 1:100,000 epinephrine, followed by deeper local anesthetic to the level of the peritoneum under ultrasound guidance. A 5 [place] Yueh needle was advanced into the peritoneal fluid under ultrasound guidance. A total of 3 L of clear, straw-colored fluid was removed during the course of the procedure. The right upper quadrant and indwelling biloma drain were prepped and draped in standard fashion. Hand injection of contrast via the indwelling biloma drain demonstrated appropriate position within the gallbladder fossa as well as a patent cystic duct. Contrast was visualized within the metallic biliary stent and filled retrograde into the right, nondilated bile ducts. The distal common bile duct stent was occluded. A right posterior duct was identified is in adequate puncture site for biliary drain. A small amount of subdermal Local anesthesia was provided to the right mid axillary line at planned entry site as well as deeper along the hepatic capsule. Under fluoroscopic guidance with multiple obliquities, a 22 gauge Chiba needle was directed toward the opacified right posterior duct. The duct was punctured successfully, confirmed by gentle hand injection of contrast. A Nitrex wire was then directed into the right posterior duct through the metallic common bile duct stent and into the duodenum without difficulty. A 6 French coaxial introducer set was placed over the Nitrex wire. The inner cannula were removed and an Amplatz wire was directed adjacent to the Nitrex wire into the duodenum near the ligament of Treitz. Serial dilation was performed with 8 and 10 [place] dilators. A 10.2 [place] biliary drain with 10 cm of custom cut extra sideholes was then introduced with the pigtail portion coiled in the fourth portion of the duodenum near the ligament of Treitz. Hand injection of contrast demonstrated opacification of the nondilated  biliary tree which passed freely into the small bowel. The drain was affixed to the skin with 0 Prolene suture and a Stay Fix dressing. The patient tolerated the procedure well and was returned to the floor in stable condition. IMPRESSION: 1. Occluded indwelling metallic biliary stent at the distal end, presumed secondary to edema related to duodenitis/pancreatitis. 2. Indwelling biloma drain in satisfactory position. The cystic duct is patent. 3. Successful placement of an internal external biliary drain via right posterior bile ducts. The distal pigtail portion of the drain is positioned near the ligament of Treitz. 4. Ultrasound-guided paracentesis yielding 3 L of clear, straw-colored fluid. PLAN: Recommend capping biloma drain once any post-procedural hemobilia has resolved. Recommend capping trial of biliary drain 12-24 hours after capping of biloma drain. IR will follow closely. Electronically Signed   By: Ruthann Cancer MD   On: 05/30/2020 08:22   IR INT EXT BILIARY DRAIN WITH CHOLANGIOGRAM  Result Date: 05/30/2020 INDICATION: 64 year old male with history of complex postoperative course after subtotal cholecystectomy for acute gangrenous cholecystitis in May 2831 complicated by biloma formation requiring percutaneous drainage on 02/13/2020 and placement of metallic common bile duct stent on 04/26/2020. Most recently, the patient has developed pancreatitis and duodenitis with ERCP findings consistent with obstruction of the distal common bile duct stent and subsequently high output from the biloma drain. Percutaneous cholangiogram and internal external biliary drain placement is requested for decompression and management of biliary fluid losses. EXAM: 1. Ultrasound-guided paracentesis 2. Cholangiogram via indwelling gallbladder fossa drain 3. Percutaneous cholangiogram 4. Placement of right-sided internal external biliary drain MEDICATIONS: Zosyn 3.375 g; The antibiotic was administered within an  appropriate time frame prior to the initiation of the procedure. ANESTHESIA/SEDATION: Moderate (  conscious) sedation was employed during this procedure. A total of Versed 2 mg and Fentanyl 75 mcg was administered intravenously. Moderate Sedation Time: 55 minutes. The patient's level of consciousness and vital signs were monitored continuously by radiology nursing throughout the procedure under my direct supervision. FLUOROSCOPY TIME:  Fluoroscopy Time: 17 minutes 18 seconds (81 mGy). COMPLICATIONS: None immediate. PROCEDURE: Informed written consent was obtained from the patient after a thorough discussion of the procedural risks, benefits and alternatives. All questions were addressed. Maximal Sterile Barrier Technique was utilized including caps, mask, sterile gowns, sterile gloves, sterile drape, hand hygiene and skin antiseptic. A timeout was performed prior to the initiation of the procedure. Preprocedure ultrasound demonstrated moderate to large volume ascites, including in the perihepatic space. The largest fluid pocket visualized in the right lower quadrant. Therefore the right lower quadrant was prepped and draped in standard fashion. The puncture site was planned. Subdermal Local anesthesia was provided with 1% lidocaine with 1:100,000 epinephrine, followed by deeper local anesthetic to the level of the peritoneum under ultrasound guidance. A 5 [place] Yueh needle was advanced into the peritoneal fluid under ultrasound guidance. A total of 3 L of clear, straw-colored fluid was removed during the course of the procedure. The right upper quadrant and indwelling biloma drain were prepped and draped in standard fashion. Hand injection of contrast via the indwelling biloma drain demonstrated appropriate position within the gallbladder fossa as well as a patent cystic duct. Contrast was visualized within the metallic biliary stent and filled retrograde into the right, nondilated bile ducts. The distal common bile  duct stent was occluded. A right posterior duct was identified is in adequate puncture site for biliary drain. A small amount of subdermal Local anesthesia was provided to the right mid axillary line at planned entry site as well as deeper along the hepatic capsule. Under fluoroscopic guidance with multiple obliquities, a 22 gauge Chiba needle was directed toward the opacified right posterior duct. The duct was punctured successfully, confirmed by gentle hand injection of contrast. A Nitrex wire was then directed into the right posterior duct through the metallic common bile duct stent and into the duodenum without difficulty. A 6 French coaxial introducer set was placed over the Nitrex wire. The inner cannula were removed and an Amplatz wire was directed adjacent to the Nitrex wire into the duodenum near the ligament of Treitz. Serial dilation was performed with 8 and 10 [place] dilators. A 10.2 [place] biliary drain with 10 cm of custom cut extra sideholes was then introduced with the pigtail portion coiled in the fourth portion of the duodenum near the ligament of Treitz. Hand injection of contrast demonstrated opacification of the nondilated biliary tree which passed freely into the small bowel. The drain was affixed to the skin with 0 Prolene suture and a Stay Fix dressing. The patient tolerated the procedure well and was returned to the floor in stable condition. IMPRESSION: 1. Occluded indwelling metallic biliary stent at the distal end, presumed secondary to edema related to duodenitis/pancreatitis. 2. Indwelling biloma drain in satisfactory position. The cystic duct is patent. 3. Successful placement of an internal external biliary drain via right posterior bile ducts. The distal pigtail portion of the drain is positioned near the ligament of Treitz. 4. Ultrasound-guided paracentesis yielding 3 L of clear, straw-colored fluid. PLAN: Recommend capping biloma drain once any post-procedural hemobilia has  resolved. Recommend capping trial of biliary drain 12-24 hours after capping of biloma drain. IR will follow closely. Electronically Signed  By: Ruthann Cancer MD   On: 05/30/2020 08:22   IR Paracentesis  Result Date: 05/30/2020 INDICATION: 64 year old male with history of complex postoperative course after subtotal cholecystectomy for acute gangrenous cholecystitis in May 2263 complicated by biloma formation requiring percutaneous drainage on 02/13/2020 and placement of metallic common bile duct stent on 04/26/2020. Most recently, the patient has developed pancreatitis and duodenitis with ERCP findings consistent with obstruction of the distal common bile duct stent and subsequently high output from the biloma drain. Percutaneous cholangiogram and internal external biliary drain placement is requested for decompression and management of biliary fluid losses. EXAM: 1. Ultrasound-guided paracentesis 2. Cholangiogram via indwelling gallbladder fossa drain 3. Percutaneous cholangiogram 4. Placement of right-sided internal external biliary drain MEDICATIONS: Zosyn 3.375 g; The antibiotic was administered within an appropriate time frame prior to the initiation of the procedure. ANESTHESIA/SEDATION: Moderate (conscious) sedation was employed during this procedure. A total of Versed 2 mg and Fentanyl 75 mcg was administered intravenously. Moderate Sedation Time: 55 minutes. The patient's level of consciousness and vital signs were monitored continuously by radiology nursing throughout the procedure under my direct supervision. FLUOROSCOPY TIME:  Fluoroscopy Time: 17 minutes 18 seconds (81 mGy). COMPLICATIONS: None immediate. PROCEDURE: Informed written consent was obtained from the patient after a thorough discussion of the procedural risks, benefits and alternatives. All questions were addressed. Maximal Sterile Barrier Technique was utilized including caps, mask, sterile gowns, sterile gloves, sterile drape, hand  hygiene and skin antiseptic. A timeout was performed prior to the initiation of the procedure. Preprocedure ultrasound demonstrated moderate to large volume ascites, including in the perihepatic space. The largest fluid pocket visualized in the right lower quadrant. Therefore the right lower quadrant was prepped and draped in standard fashion. The puncture site was planned. Subdermal Local anesthesia was provided with 1% lidocaine with 1:100,000 epinephrine, followed by deeper local anesthetic to the level of the peritoneum under ultrasound guidance. A 5 [place] Yueh needle was advanced into the peritoneal fluid under ultrasound guidance. A total of 3 L of clear, straw-colored fluid was removed during the course of the procedure. The right upper quadrant and indwelling biloma drain were prepped and draped in standard fashion. Hand injection of contrast via the indwelling biloma drain demonstrated appropriate position within the gallbladder fossa as well as a patent cystic duct. Contrast was visualized within the metallic biliary stent and filled retrograde into the right, nondilated bile ducts. The distal common bile duct stent was occluded. A right posterior duct was identified is in adequate puncture site for biliary drain. A small amount of subdermal Local anesthesia was provided to the right mid axillary line at planned entry site as well as deeper along the hepatic capsule. Under fluoroscopic guidance with multiple obliquities, a 22 gauge Chiba needle was directed toward the opacified right posterior duct. The duct was punctured successfully, confirmed by gentle hand injection of contrast. A Nitrex wire was then directed into the right posterior duct through the metallic common bile duct stent and into the duodenum without difficulty. A 6 French coaxial introducer set was placed over the Nitrex wire. The inner cannula were removed and an Amplatz wire was directed adjacent to the Nitrex wire into the duodenum  near the ligament of Treitz. Serial dilation was performed with 8 and 10 [place] dilators. A 10.2 [place] biliary drain with 10 cm of custom cut extra sideholes was then introduced with the pigtail portion coiled in the fourth portion of the duodenum near the ligament of Treitz. Hand  injection of contrast demonstrated opacification of the nondilated biliary tree which passed freely into the small bowel. The drain was affixed to the skin with 0 Prolene suture and a Stay Fix dressing. The patient tolerated the procedure well and was returned to the floor in stable condition. IMPRESSION: 1. Occluded indwelling metallic biliary stent at the distal end, presumed secondary to edema related to duodenitis/pancreatitis. 2. Indwelling biloma drain in satisfactory position. The cystic duct is patent. 3. Successful placement of an internal external biliary drain via right posterior bile ducts. The distal pigtail portion of the drain is positioned near the ligament of Treitz. 4. Ultrasound-guided paracentesis yielding 3 L of clear, straw-colored fluid. PLAN: Recommend capping biloma drain once any post-procedural hemobilia has resolved. Recommend capping trial of biliary drain 12-24 hours after capping of biloma drain. IR will follow closely. Electronically Signed   By: Ruthann Cancer MD   On: 05/30/2020 08:22    Labs:  CBC: Recent Labs    05/29/20 0841 05/31/20 0458 05/31/20 1327 06/01/20 0809  WBC 23.6* 26.2* 15.6* 42.2*  HGB 8.6* 8.2* 8.6* 7.8*  HCT 29.5* 28.6* 30.8* 27.9*  PLT 304 311 290 296    COAGS: Recent Labs    02/02/20 2115 05/16/20 1905 05/29/20 0404 05/31/20 1415  INR 1.1 1.3* 1.4* 1.5*    BMP: Recent Labs    05/30/20 0514 05/30/20 0514 05/31/20 0458 05/31/20 1327 06/01/20 0516 06/01/20 1143  NA 144  --  143 145 145  --   K 3.8   < > 4.7 4.2 5.3* 4.6  CL 109  --  111 115* 114*  --   CO2 25  --  22 18* 21*  --   GLUCOSE 260*  --  206* 69* 209*  --   BUN 43*  --  47* 52* 51*  --    CALCIUM 8.2*  --  8.6* 8.6* 8.2*  --   CREATININE 0.99  --  1.05 1.39* 1.23  --   GFRNONAA >60  --  >60 53* >60  --   GFRAA >60  --  >60 >60 >60  --    < > = values in this interval not displayed.    LIVER FUNCTION TESTS: Recent Labs    05/28/20 0350 05/31/20 0458 05/31/20 1327 06/01/20 0856  BILITOT 0.9 0.7 2.4* 1.2  AST 28 25 32 48*  ALT 29 36 33 39  ALKPHOS 303* 313* 472* 318*  PROT 5.7* 5.7* 5.5* 6.0*  ALBUMIN 1.4* 1.3* 1.7* 1.7*    Assessment and Plan: Bile leak post subtotal cholecystectomy 01/24/20 s/p gall bladder fossa drain placement 02/13/20 due to high-output bile duct remnant despite stent placement by GI 7/14.  ERCP pancreatitis Esophagitis and gastritis found on repeat EGD yesterday (9/21) with GI S/p internal/external biliary drain placement by Dr. Serafina Royals with 3.0 liter paracentesis --noted occlusion of the distal aspect of the biliary stent.  Patient assessed at bedside this AM. AMS ongoing-- periods of clarity clouded by hallucinations but is helpful with exam and following commands.  Pressor requirement improving.  Started on Zosyn. WBC 42.2 this AM. Afebrile. Lactic acid 2.9-3.5 this AM, 5.3 yesterday.  Tbili back to WNL, 1.2.  Both drains to gravity today.  No change in output.  Biloma drain remains serous/cloudy/slightly bilious.  I/E drain remains dark bilious.  Abdomen distended, improved after NGT placement.  IR following.  Continue current care plan.   Electronically Signed: Docia Barrier, PA 06/01/2020, 1:49 PM   I spent  a total of 25 Minutes at the the patient's bedside AND on the patient's hospital floor or unit, greater than 50% of which was counseling/coordinating care for biliary obstruction

## 2020-06-01 NOTE — Progress Notes (Addendum)
[facility] Progress Note  CC:  Bile leak, pancreatitis  Subjective:  He complains of pain throughout his back. No N/V. No gas per the rectum. No BM x 1 week. He remains NPO on TPN. His nurse reports he is having hallucinations, talking out loud when no one is in the room. Wife left the bed side when patient became anxious.    Objective:   CTAP 05/31/2020: 1. Again noted are findings of acute pancreatitis with relatively stable peripancreatic fluid collections. 2. Findings consistent with a gastric ileus. The esophagus is dilated and fluid-filled to at least the mid thorax. This patient would likely benefit from NG tube decompression. 3. Worsening airspace opacities at the lung bases concerning for developing infiltrates or aspiration. 4. Moderate-sized left-sided pleural effusion with at least partial collapse of the left lower lobe. There is a trace to small right-sided pleural effusion with mild adjacent atelectasis. 5. Stable small volume abdominal ascites. 6. Well-positioned internal/external biliary drain. Unchanged cholecystostomy tube. Aortic Atherosclerosis   Vital signs in last 24 hours: Temp:  [96.4 F (35.8 C)-99.7 F (37.6 C)] 96.4 F (35.8 C) (09/24 0800) Pulse Rate:  [32-143] 94 (09/24 1000) Resp:  [11-32] 18 (09/24 1000) BP: (63-134)/(35-116) 121/91 (09/24 1000) SpO2:  [74 %-100 %] 100 % (09/24 1000) FiO2 (%):  [100 %] 100 % (09/23 1320) Weight:  [83.9 kg] 83.9 kg (09/24 0500) Last BM Date: 05/28/20   General:  Critically ill appearing male, labored breathing. O2 8 L [place] high flow. Oxygen sats 97%.  Heart: RRR, no murmurs.  Pulm: Breath sounds clear posteriorly.  Abdomen: Distended, firm, nontender. ? ascites. No bowel sounds auscultated with NGT clamped. RUQ  internal/external biliary drain and  cholecystostomy tube intact.  Extremities:  LEs with 2+ pitting edema.  Neurologic:  Alert and  oriented x 3. No current hallucinations.    Psych:  Alert and cooperative. Intermittently anxious.   Intake/Output from previous day: 09/23 0701 - 09/24 0700 In: 3529.6 [I.V.:3529.6] Out: 2830 [Urine:1100; Emesis/NG output:1500; Drains:230] Intake/Output this shift: No intake/output data recorded.  Lab Results: Recent Labs    05/31/20 0458 05/31/20 1327 06/01/20 0809  WBC 26.2* 15.6* 42.2*  HGB 8.2* 8.6* 7.8*  HCT 28.6* 30.8* 27.9*  PLT 311 290 296   BMET Recent Labs    05/31/20 0458 05/31/20 1327 06/01/20 0516  NA 143 145 145  K 4.7 4.2 5.3*  CL 111 115* 114*  CO2 22 18* 21*  GLUCOSE 206* 69* 209*  BUN 47* 52* 51*  CREATININE 1.05 1.39* 1.23  CALCIUM 8.6* 8.6* 8.2*   LFT Recent Labs    06/01/20 0856  PROT 6.0*  ALBUMIN 1.7*  AST 48*  ALT 39  ALKPHOS 318*  BILITOT 1.2  BILIDIR 0.6*  IBILI 0.6   PT/INR Recent Labs    05/31/20 1415  LABPROT 17.7*  INR 1.5*   Hepatitis Panel No results for input(s): HEPBSAG, HCVAB, HEPAIGM, HEPBIGM in the last 72 hours.  DG Abd 1 View  Result Date: 05/31/2020 CLINICAL DATA:  Check gastric catheter placement EXAM: ABDOMEN - 1 VIEW COMPARISON:  CT from earlier in the same day. FINDINGS: Scattered large and small bowel gas is noted. Cholecystostomy tube as well as internal external biliary drain is noted with a CBD stent in place. Gastric catheter is coiled within the stomach. No other focal abnormality is noted. IMPRESSION: Gastric catheter coiled within the stomach. Electronically Signed   By: Inez Catalina M.D.   On: 05/31/2020  20:42   DG Abd 1 View  Result Date: 05/31/2020 CLINICAL DATA:  Abdominal distension. EXAM: ABDOMEN - 1 VIEW COMPARISON:  May 27, 2020. FINDINGS: The bowel gas pattern is normal. Internal external biliary drain is noted passing through biliary stent, with distal tip in expected position of the distal duodenum. IMPRESSION: No evidence of bowel obstruction or ileus. Internal external biliary drain is noted. Electronically Signed   By:  Marijo Conception M.D.   On: 05/31/2020 14:44   CT ABDOMEN PELVIS W CONTRAST  Result Date: 05/31/2020 CLINICAL DATA:  Pancreatitis EXAM: CT ABDOMEN AND PELVIS WITH CONTRAST TECHNIQUE: Multidetector CT imaging of the abdomen and pelvis was performed using the standard protocol following bolus administration of intravenous contrast. CONTRAST:  116mL OMNIPAQUE IOHEXOL 300 MG/ML  SOLN COMPARISON:  05/26/2020 FINDINGS: Lower chest: There is a moderate-sized left-sided pleural effusion that is only partially visualized. There is at least partial collapse of the left lower lobe. There are new coarse airspace opacities in the lingula and right lung base concerning infiltrates or aspiration. There is a small right-sided pleural effusion. The Ace off a Gus is dilated and fluid-filled. Hepatobiliary: There is an internal external biliary drain coursing through the patient's right hepatic lobe. The drain appears to be well position. Again noted is a percutaneous cholecystostomy tube. The gallbladder is decompressed.A common bile duct stent is again noted. There is minimal intrahepatic biliary ductal dilatation. Pancreas: Again noted are findings of acute pancreatitis. There are persistent peripancreatic fluid collections which have changed minimally in size since the prior study. 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 Spleen: Unremarkable. Adrenals/Urinary Tract: --Adrenal glands: Unremarkable. --Right kidney/ureter: Are nonobstructing right-sided nephroliths. --Left kidney/ureter: There is a nonobstructing stone in the upper pole the left kidney. --Urinary bladder: The urinary bladder is decompressed and therefore is poorly evaluated. Stomach/Bowel: --Stomach/Duodenum: The stomach is significantly distended and contains an air-fluid level. There is asymmetric wall thickening of the lesser curvature of the stomach, likely reactive. --Small bowel: Unremarkable. --Colon:  There are few scattered colonic diverticula without CT evidence for diverticulitis. There is some wall thickening of the splenic and hepatic flexures of the colon, presumably reactive in etiology. --Appendix: Normal. Vascular/Lymphatic: Atherosclerotic calcification is present within the non-aneurysmal abdominal aorta, without hemodynamically significant stenosis. The splenic artery appears to be patent. There appears to be moderate attenuation of the splenic vein and portal venous confluence as well as the proximal main portal vein. --No retroperitoneal lymphadenopathy. --No mesenteric lymphadenopathy. --No pelvic or inguinal lymphadenopathy. Reproductive: Unremarkable Other: Again noted is a small to moderate volume of abdominal ascites. There is body wall edema, slightly progressed from prior study. Musculoskeletal. No acute displaced fractures. IMPRESSION: 1. Again noted are findings of acute pancreatitis with relatively stable peripancreatic fluid collections. 2. Findings consistent with a gastric ileus. The esophagus is dilated and fluid-filled to at least the mid thorax. This patient would likely benefit from NG tube decompression. 3. Worsening airspace opacities at the lung bases concerning for developing infiltrates or aspiration. 4. Moderate-sized left-sided pleural effusion with at least partial collapse of the left lower lobe. There is a trace to small right-sided pleural effusion with mild adjacent atelectasis. 5. Stable small volume abdominal ascites. 6. Well-positioned internal/external biliary drain. Unchanged cholecystostomy tube. Aortic Atherosclerosis (ICD10-I70.0). Electronically Signed   By: Constance Holster M.D.   On: 05/31/2020 16:04   DG CHEST PORT 1 VIEW  Result Date: 06/01/2020 CLINICAL DATA:  Shortness of breath. EXAM:  PORTABLE CHEST 1 VIEW FINDING: FINDING Mild enlargement the cardiac silhouette, which is accentuated by low lung volumes and portable technique. Similar appearance of  low lung volumes with left basilar opacity. Small left pleural effusion. No discernible pneumothorax. Gastric tube courses below the diaphragm, coiled in the stomach. Right-sided PICC with the tip projecting at the superior cavoatrial junction. External device overlying the left chest, possibly a suction catheter. IMPRESSION: Similar appearance of a small left pleural effusion with overlying opacity representing either atelectasis, aspiration, and/or pneumonia. Electronically Signed   By: Margaretha Sheffield MD   On: 06/01/2020 08:23   DG CHEST PORT 1 VIEW  Result Date: 05/31/2020 CLINICAL DATA:  Hypoxemia. EXAM: PORTABLE CHEST 1 VIEW COMPARISON:  May 27, 2020. FINDINGS: Stable cardiomediastinal silhouette. No pneumothorax is noted. Stable bibasilar opacities are noted concerning for atelectasis or infiltrates. Small left pleural effusion is noted. Right PICC line is unchanged. Bony thorax is unremarkable. IMPRESSION: Stable bibasilar atelectasis or infiltrates. Small left pleural effusion is noted. Electronically Signed   By: Marijo Conception M.D.   On: 05/31/2020 14:43    Assessment / Plan:  52. 63 year old male with high output biliary fistula s/p cholecystectomy, s/p biloma drain placement 02/13/20. Severe post ERCP pancreatitis.  S/P internal/external biliary drain placement 9/21. He became hypotensive yesterday with BP 60s/40s. He received IV fluid started on Levophed then transferred to the ICU. T. Bili down to 1.2.  Lactic acid level 2.9 -> 3.5. Patient is on TPN. CTAP 9/23 showed acute pancreatitis with stable peripancreatic fluid collections and well-positioned internal/external biliary drain and cholecystostomy tube.  -Continue TPN -Repeat CBC, BMP, Hepatic panel in am  -General surgery following   2. Gastric ileus per CTAP 9/23. NGT placed 9/23, 2L of bilious gastric drainage removed.  -Continue NGT to LIS  3. Leukocytosis, multifactorial. WBC 42.2. Zosyn IV restarted.  He is afebrile     4. Normocytic anemia. Hg 8.6 -> 7.8 S/P unsuccessful EGD 9/21: severe esophagitis with oozing blood, hemorrhagic gastritis and gastric NGT trauma.  Congested duodenal mucosa D1/D2, normal D3. No evidence of active GI bleeding.  -Continue  Pantoprazole 40mg  IV bid   4. SOB, bilateral pleural effusion L > R    Further recommendations per Dr. Rush Landmark  Principal Problem:   Post-ERCP acute pancreatitis Active Problems:   Severe sepsis ([place])   Polycythemia   AKI (acute kidney injury) ([place])   Metabolic acidosis   Acute respiratory failure with hypoxia ([facility])   Bilateral pleural effusion   Ileus (H[facility])   Bile duct leak   Gastric outlet obstruction   Abdominal distention   ARDS (adult respiratory distress syndrome) ([place])   Biloma following surgery   Acute blood loss anemia     LOS: 35 days   Noralyn Pick  06/01/2020, 11:32 AM

## 2020-06-01 NOTE — Progress Notes (Signed)
NAME:  Adam Fowler, MRN:  [id number], DOB:  1956/04/15, LOS: 59 ADMISSION DATE:  04/26/2020, CONSULTATION DATE:  9/23 REFERRING MD:  Wyline Copas, CHIEF COMPLAINT:  Hypotension  Brief History   64 yo M s/p subtotal cholecystectomy c/b persistent bile lek and biliary fistula, post ERCP necrotizing pancreatitis-- developed hypotension 9/23 and CCM consulted   Past Medical History   Kidney stones HTN Insomnia S/p cholecystectomy and abscess  Pancreatitis  Significant Hospital Events   8/20 admit 8/21-24 admitted 9/21 perc bilary drainage  9/23 drain capped. Acute decompensation in afternoon. Fluid resuscitation, starting on pressors and transferring to ICU   Consults:  PCCM GI CCS IR  Procedures:  ETT 8/21-24  Significant Diagnostic Tests:  9/18 ct a/p w/con> moderate peripancreatic fluid extending into gastrohepatic ligament. Possible pseudocyst. Pigtail drainage catheter tip in region of gallbladder fossa. CBD stent. Persistent bibasilar opacities. L pleural effusion. Moderate ascites. Colonic diverticulosis  9/23 KUB>> cholecystostomy tube & internal/external biliary drain noted with a CBD stent in place, gastric tube within the stomach. Scattered large & small bowel gas pattern noted  9/23 ct a/p w con>>> Again noted are findings of acute pancreatitis with relatively stable peripancreatic fluid collections.  Findings consistent with a gastric ileus. The esophagus is dilated and fluid-filled to at least the mid thorax. This patient would likely benefit from NG tube decompression. Worsening airspace opacities at the lung bases concerning for developing infiltrates or aspiration. Moderate-sized left-sided pleural effusion with at least partial collapse of the left lower lobe. There is a trace to small right-sided pleural effusion with mild adjacent atelectasis.  Stable small volume abdominal ascites.  Well-positioned internal/external biliary drain. Unchanged cholecystostomy  tube.  Micro Data:  8/19 COVID-19 >> negative 9/10 peritoneal fluid Cx >> negative 9/23 BCx>>   Antimicrobials:    Interim history/subjective:  Patient sitting up in bed, complaining of intermittent lower back pain, but states he is otherwise "feeling fine".  When asked about yesterday's events, patient said he needed a "minute to think about it" and then began talking about a clinic lock down with hostages.  He reports not sleeping well. While bedside titrated O2 from NRB > HFNC 5 L Spo2 > 95%  Overnight: - NE titrated down to 10 mcg - NGT output per bedside RN: 2 L post insertion / 300 mL night shift & 100 ML so far this AM  Objective   Blood pressure 108/76, pulse 92, temperature (!) 97 F (36.1 C), temperature source Axillary, resp. rate 16, height 5\' 9"  (1.753 m), weight 83.9 kg, SpO2 100 %.    FiO2 (%):  [100 %] 100 %   Intake/Output Summary (Last 24 hours) at 06/01/2020 0737 Last data filed at 06/01/2020 0600 Gross per 24 hour  Intake 3529.64 ml  Output 2830 ml  Net 699.64 ml   Filed Weights   05/26/20 0500 05/28/20 0500 06/01/20 0500  Weight: 84.6 kg 83.5 kg 83.9 kg    Examination: General: Pleasantly confused adult, sitting up in bed, NAD HEENT: MM pink/moist, anicteric, PERRL Neuro: Alert & oriented- self/ place/ time, disoriented to situation & intermittently confused, follows commands/CAM+, MAE CV: s1s2, RRR, no murmurs/rubs/gallops, NSR on monitor, +2 pulses R/P Pulm: Regular, non-labored on HFNC 5L, breath sounds: BUL-clear/diminished & BLL- fine crackles/diminished GI: distention, taut, hypoactive bs x 4, drains x 2 in place Skin: jaundice, intact, scattered ecchymosis, no rashes/lesions noted Extremities: warm/dry, +2 BLE (feet) edema noted  Afebrile Labs/Imaging personally reveiwed K+ 5.3 BUN/Cr: 51/1.23  CXR 9/24 >>  worsening opacities seen on [facility]  Resolved Hospital Problem list     Assessment & Plan:    Acute post ERCP  pancreatitis Persistent bile leak following cholecystectomy  - continue to monitor for s/s of developing illeus, ensure bowel regimen in place - continue TPN  - continue scheduled oxycodone, PRN Fentanyl & Oxycodone - GI, CCS & IR following - Bili drain to gravity - NGT placed to ILWS for decompression  Septic Shock RR 9/23: drain capped 08:00 and at 15:00 patient c/o abdominal pain- hypotensive, tachycardic, lactic: 5.3. Patient required Levo & 3 L IVF resuscitation, transfer to ICU, drain placed back to gravity & NGT placed for decompression.  - Pending lactic, CBC, baseline procalcitonin - low threshold for empiric antibiotics - Levo for MAP > 65 - continue IVF - follow BCx - monitor WBC/fever curve - continuous cardiac monitoring  Acute Hypoxic Respiratory Insufficiency- multifactorial in the setting of abdominal distention with possible pneumonia/pleural effusion  - Supplemental O2- HFNC 5 L, NRB PRN to maintain SpO2 > 92% - Aggressive pulmonary hygiene as tolerated: IS x 10 Q1h & flutter valve Q2h while awake - plan to [place] LLL to assess for pleural effusion amenable to thoracentesis vs consolidation - follow procalcitonin/CBC/fever curve  Esophagitis with recent bleeding  - NPO w/ meds, TPN running - monitor and assess for bleeding - NGT to ILWS  - BID protonix  ICU Delirium Patient currently ordered trazedone, but is not sleeping well.   -  Trazedone changed to Ambien (per wife's report & verification of home med list) to assist with sleep - Ensure sleep/wake cycle - mobilize as tolerated - utilize pain PRN's when needed  Hx of HTN Home medications: lisinopril/HCTZ  - holding home meds in the setting of shock     Best practice:   Diet: TPN  Pain/Anxiety/Delirium protocol (if indicated): initiated VAP protocol (if indicated): na DVT prophylaxis: lovenox GI prophylaxis: BID protonix Glucose control: SSI target range 140-180 Mobility: bedrest, mobilize as  tolerated Code Status: Full  Family Communication: wife updated via phone- 9/23 Disposition: ICU   Labs   CBC: Recent Labs  Lab 05/27/20 0355 05/28/20 0350 05/29/20 0841 05/31/20 0458 05/31/20 1327  WBC 25.5* 23.3* 23.6* 26.2* 15.6*  NEUTROABS  --  19.0*  --   --   --   HGB 9.6* 8.8* 8.6* 8.2* 8.6*  HCT 31.1* 28.6* 29.5* 28.6* 30.8*  MCV 90.4 90.2 91.9 92.6 96.3  PLT 332 321 304 311 952    Basic Metabolic Panel: Recent Labs  Lab 05/26/20 0349 05/26/20 0349 05/27/20 0355 05/27/20 0355 05/28/20 0350 05/28/20 0350 05/29/20 0404 05/30/20 0514 05/31/20 0458 05/31/20 1327 06/01/20 0516  NA 141   < > 142   < > 142   < > 146* 144 143 145 145  K 4.1   < > 4.2   < > 3.8   < > 3.7 3.8 4.7 4.2 5.3*  CL 104   < > 102   < > 103   < > 109 109 111 115* 114*  CO2 28   < > 31   < > 28   < > 27 25 22  18* 21*  GLUCOSE 320*   < > 344*   < > 297*   < > 203* 260* 206* 69* 209*  BUN 28*   < > 37*   < > 41*   < > 39* 43* 47* 52* 51*  CREATININE 0.61   < > 0.75   < >  0.82   < > 0.92 0.99 1.05 1.39* 1.23  CALCIUM 8.3*   < > 8.4*   < > 8.2*   < > 8.3* 8.2* 8.6* 8.6* 8.2*  MG 2.1  --  2.1  --  2.1  --   --   --  2.4  --   --   PHOS 3.6  --  3.9  --  3.2  --   --   --  3.4  --   --    < > = values in this interval not displayed.   GFR: Estimated Creatinine Clearance: 60.7 mL/min (by C-G formula based on SCr of 1.23 mg/dL). Recent Labs  Lab 05/28/20 0350 05/29/20 0841 05/31/20 0458 05/31/20 1327 05/31/20 1834  WBC 23.3* 23.6* 26.2* 15.6*  --   LATICACIDVEN  --   --   --   --  5.3*    Liver Function Tests: Recent Labs  Lab 05/28/20 0350 05/31/20 0458 05/31/20 1327  AST 28 25 32  ALT 29 36 33  ALKPHOS 303* 313* 472*  BILITOT 0.9 0.7 2.4*  PROT 5.7* 5.7* 5.5*  ALBUMIN 1.4* 1.3* 1.7*   No results for input(s): LIPASE, AMYLASE in the last 168 hours. No results for input(s): AMMONIA in the last 168 hours.  ABG    Component Value Date/Time   PHART 7.295 (L) 05/31/2020 1625    PCO2ART 38.6 05/31/2020 1625   PO2ART 62.0 (L) 05/31/2020 1625   HCO3 18.0 (L) 05/31/2020 1625   TCO2 26 04/30/2020 0241   ACIDBASEDEF 7.1 (H) 05/31/2020 1625   O2SAT 87.5 05/31/2020 1625     Coagulation Profile: Recent Labs  Lab 05/29/20 0404 05/31/20 1415  INR 1.4* 1.5*    Cardiac Enzymes: No results for input(s): CKTOTAL, CKMB, CKMBINDEX, TROPONINI in the last 168 hours.  HbA1C: Hgb A1c MFr Bld  Date/Time Value Ref Range Status  04/29/2020 02:02 AM 5.6 4.8 - 5.6 % Final    Comment:    (NOTE) Pre diabetes:          5.7%-6.4%  Diabetes:              >6.4%  Glycemic control for   <7.0% adults with diabetes   02/13/2020 04:55 PM 6.0 (H) 4.8 - 5.6 % Final    Comment:    (NOTE) Pre diabetes:          5.7%-6.4% Diabetes:              >6.4% Glycemic control for   <7.0% adults with diabetes     CBG: Recent Labs  Lab 05/31/20 0756 05/31/20 1142 05/31/20 1934 05/31/20 2322 06/01/20 0352  GLUCAP 142* 158* 85 187* 251*     Critical care time: 55 minutes   CRITICAL CARE Performed by: Huel Cote Rust-Chester   Total critical care time: 55 minutes  Critical care time was exclusive of separately billable procedures and treating other patients.  Critical care was necessary to treat or prevent imminent or life-threatening deterioration.  Critical care was time spent personally by me on the following activities: development of treatment plan with patient and/or surrogate as well as nursing, discussions with consultants, evaluation of patient's response to treatment, examination of patient, obtaining history from patient or surrogate, ordering and performing treatments and interventions, ordering and review of laboratory studies, ordering and review of radiographic studies, pulse oximetry and re-evaluation of patient's condition.   Domingo Pulse Rust-Chester, AGACNP-BC [facility]    Please see Amion  for pager details.

## 2020-06-02 ENCOUNTER — Inpatient Hospital Stay (HOSPITAL_COMMUNITY): Payer: 59

## 2020-06-02 LAB — BLOOD CULTURE ID PANEL (REFLEXED) - BCID2
A.calcoaceticus-baumannii: NOT DETECTED
Bacteroides fragilis: NOT DETECTED
CTX-M ESBL: NOT DETECTED
Candida albicans: NOT DETECTED
Candida auris: NOT DETECTED
Candida glabrata: NOT DETECTED
Candida krusei: NOT DETECTED
Candida parapsilosis: NOT DETECTED
Candida tropicalis: NOT DETECTED
Carbapenem resist OXA 48 LIKE: NOT DETECTED
Carbapenem resistance IMP: NOT DETECTED
Carbapenem resistance KPC: NOT DETECTED
Carbapenem resistance NDM: NOT DETECTED
Carbapenem resistance VIM: NOT DETECTED
Cryptococcus neoformans/gattii: NOT DETECTED
Enterobacter cloacae complex: NOT DETECTED
Enterobacterales: DETECTED — AB
Enterococcus Faecium: NOT DETECTED
Enterococcus faecalis: DETECTED — AB
Escherichia coli: NOT DETECTED
Haemophilus influenzae: NOT DETECTED
Klebsiella aerogenes: NOT DETECTED
Klebsiella oxytoca: NOT DETECTED
Klebsiella pneumoniae: DETECTED — AB
Listeria monocytogenes: NOT DETECTED
Neisseria meningitidis: NOT DETECTED
Proteus species: NOT DETECTED
Pseudomonas aeruginosa: NOT DETECTED
Salmonella species: NOT DETECTED
Serratia marcescens: NOT DETECTED
Staphylococcus aureus (BCID): NOT DETECTED
Staphylococcus epidermidis: NOT DETECTED
Staphylococcus lugdunensis: NOT DETECTED
Staphylococcus species: NOT DETECTED
Stenotrophomonas maltophilia: NOT DETECTED
Streptococcus agalactiae: NOT DETECTED
Streptococcus pneumoniae: NOT DETECTED
Streptococcus pyogenes: NOT DETECTED
Streptococcus species: NOT DETECTED
Vancomycin resistance: NOT DETECTED

## 2020-06-02 LAB — COMPREHENSIVE METABOLIC PANEL
ALT: 30 U/L (ref 0–44)
AST: 27 U/L (ref 15–41)
Albumin: 1.5 g/dL — ABNORMAL LOW (ref 3.5–5.0)
Alkaline Phosphatase: 246 U/L — ABNORMAL HIGH (ref 38–126)
Anion gap: 9 (ref 5–15)
BUN: 48 mg/dL — ABNORMAL HIGH (ref 8–23)
CO2: 21 mmol/L — ABNORMAL LOW (ref 22–32)
Calcium: 8.4 mg/dL — ABNORMAL LOW (ref 8.9–10.3)
Chloride: 116 mmol/L — ABNORMAL HIGH (ref 98–111)
Creatinine, Ser: 0.9 mg/dL (ref 0.61–1.24)
GFR calc Af Amer: >60 mL/min (ref >60)
GFR calc non Af Amer: >60 mL/min (ref >60)
Glucose, Bld: 228 mg/dL — ABNORMAL HIGH (ref 70–99)
Potassium: 3.7 mmol/L (ref 3.5–5.1)
Sodium: 146 mmol/L — ABNORMAL HIGH (ref 135–145)
Total Bilirubin: 1.1 mg/dL (ref 0.3–1.2)
Total Protein: 5.6 g/dL — ABNORMAL LOW (ref 6.5–8.1)

## 2020-06-02 LAB — CBC WITH DIFFERENTIAL/PLATELET
Abs Immature Granulocytes: 0.28 10*3/uL — ABNORMAL HIGH (ref 0.00–0.07)
Basophils Absolute: 0.1 10*3/uL (ref 0.0–0.1)
Basophils Relative: 1 %
Eosinophils Absolute: 0.3 10*3/uL (ref 0.0–0.5)
Eosinophils Relative: 2 %
HCT: 26.8 % — ABNORMAL LOW (ref 39.0–52.0)
Hemoglobin: 7.7 g/dL — ABNORMAL LOW (ref 13.0–17.0)
Immature Granulocytes: 2 %
Lymphocytes Relative: 5 %
Lymphs Abs: 1 10*3/uL (ref 0.7–4.0)
MCH: 26.9 pg (ref 26.0–34.0)
MCHC: 28.7 g/dL — ABNORMAL LOW (ref 30.0–36.0)
MCV: 93.7 fL (ref 80.0–100.0)
Monocytes Absolute: 0.9 10*3/uL (ref 0.1–1.0)
Monocytes Relative: 5 %
Neutro Abs: 16 10*3/uL — ABNORMAL HIGH (ref 1.7–7.7)
Neutrophils Relative %: 85 %
Platelets: 250 10*3/uL (ref 150–400)
RBC: 2.86 MIL/uL — ABNORMAL LOW (ref 4.22–5.81)
RDW: 16 % — ABNORMAL HIGH (ref 11.5–15.5)
WBC Morphology: INCREASED
WBC: 18.5 10*3/uL — ABNORMAL HIGH (ref 4.0–10.5)
nRBC: 0.2 % (ref 0.0–0.2)

## 2020-06-02 LAB — GLUCOSE, CAPILLARY
Glucose-Capillary: 169 mg/dL — ABNORMAL HIGH (ref 70–99)
Glucose-Capillary: 205 mg/dL — ABNORMAL HIGH (ref 70–99)
Glucose-Capillary: 158 mg/dL — ABNORMAL HIGH (ref 70–99)
Glucose-Capillary: 182 mg/dL — ABNORMAL HIGH (ref 70–99)
Glucose-Capillary: 159 mg/dL — ABNORMAL HIGH (ref 70–99)
Glucose-Capillary: 145 mg/dL — ABNORMAL HIGH (ref 70–99)

## 2020-06-02 LAB — PHOSPHORUS: Phosphorus: 2.7 mg/dL (ref 2.5–4.6)

## 2020-06-02 LAB — MAGNESIUM: Magnesium: 2.3 mg/dL (ref 1.7–2.4)

## 2020-06-02 LAB — PROCALCITONIN: Procalcitonin: 16.76 ng/mL

## 2020-06-02 MED ORDER — VANCOMYCIN HCL IN DEXTROSE 1-5 GM/200ML-% IV SOLN
1000.0000 mg | Freq: Two times a day (BID) | INTRAVENOUS | Status: DC
  Administered 2020-06-02 – 2020-06-04 (×5): 1000 mg via INTRAVENOUS
  Filled 2020-06-02 (×5): qty 200

## 2020-06-02 MED ORDER — TRAVASOL 10 % IV SOLN
INTRAVENOUS | Status: AC
  Filled 2020-06-02: qty 1320

## 2020-06-02 MED ORDER — SODIUM CHLORIDE 0.9 % IV SOLN
2.0000 g | Freq: Three times a day (TID) | INTRAVENOUS | Status: DC
  Administered 2020-06-02 – 2020-06-04 (×7): 2 g via INTRAVENOUS
  Filled 2020-06-02 (×9): qty 2

## 2020-06-02 NOTE — Progress Notes (Signed)
NAME:  Adam Fowler, MRN:  [id number], DOB:  [birth year], LOS: 11 ADMISSION DATE:  04/26/2020, CONSULTATION DATE:  9/23 REFERRING MD:  Wyline Copas, CHIEF COMPLAINT:  Hypotension  Brief History   64 yo M s/p subtotal cholecystectomy c/b persistent bile lek and biliary fistula, post ERCP necrotizing pancreatitis-- developed hypotension 9/23 and CCM consulted   Past Medical History   Kidney stones HTN Insomnia S/p cholecystectomy and abscess  Pancreatitis   Significant Hospital Events   8/20 admit 8/21-24 admitted 9/21 perc bilary drainage  9/23 drain capped. Acute decompensation in afternoon. Fluid resuscitation, starting on pressors and transferring to ICU   Consults:  PCCM GI CCS IR  Procedures:  ETT 8/21-24  Significant Diagnostic Tests:  9/18 ct a/p w/con> moderate peripancreatic fluid extending into gastrohepatic ligament. Possible pseudocyst. Pigtail drainage catheter tip in region of gallbladder fossa. CBD stent. Persistent bibasilar opacities. L pleural effusion. Moderate ascites. Colonic diverticulosis  9/23 KUB>> cholecystostomy tube & internal/external biliary drain noted with a CBD stent in place, gastric tube within the stomach. Scattered large & small bowel gas pattern noted  9/23 ct a/p w con>>> Again noted are findings of acute pancreatitis with relatively stable peripancreatic fluid collections.  Findings consistent with a gastric ileus. The esophagus is dilated and fluid-filled to at least the mid thorax. This patient would likely benefit from NG tube decompression. Worsening airspace opacities at the lung bases concerning for developing infiltrates or aspiration. Moderate-sized left-sided pleural effusion with at least partial collapse of the left lower lobe. There is a trace to small right-sided pleural effusion with mild adjacent atelectasis.  Stable small volume abdominal ascites.  Well-positioned internal/external biliary drain. Unchanged cholecystostomy  tube.  Micro Data:  8/19 COVID-19 >> negative 9/10 peritoneal fluid Cx >> negative 9/23 BCx>>   Antimicrobials:  Meropenem 9/24>> Vancomycin 9/24>> Zosyn 9/21, 9/24 Interim history/subjective:  9/25: Awake and interactive, Feels a little bit better, slept a little bit better last night  9/24: Patient sitting up in bed, complaining of intermittent lower back pain, but states he is otherwise "feeling fine".  When asked about yesterday's events, patient said he needed a "minute to think about it" and then began talking about a clinic lock down with hostages.  He reports not sleeping well. While bedside titrated O2 from NRB > HFNC 5 L Spo2 > 95%  Overnight: - NE titrated down to 10 mcg - NGT output per bedside RN: 2 L post insertion / 300 mL night shift & 100 ML so far this AM  Objective   Blood pressure 125/84, pulse 86, temperature (!) 97.1 F (36.2 C), temperature source Axillary, resp. rate 13, height 5\' 9"  (1.753 m), weight 83.1 kg, SpO2 100 %.        Intake/Output Summary (Last 24 hours) at 06/02/2020 1008 Last data filed at 06/02/2020 0800 Gross per 24 hour  Intake 2541.03 ml  Output 2255 ml  Net 286.03 ml   Filed Weights   05/28/20 0500 06/01/20 0500 06/02/20 0500  Weight: 83.5 kg 83.9 kg 83.1 kg    Examination: General: Does not appear to be in distress HEENT: Moist oral mucosa, nasogastric tube in place Neuro: Alert and oriented, occasional confusion CV: S1-S2 appreciated, no murmur  Pulm: Clear breath sounds, decreased at the bases, on oxygen supplementation  GI: distention, taut, hypoactive bs x 4, drains x 2 in place Skin: Icteric Extremities: warm/dry, +2 BLE (feet) edema noted  Afebrile Leukocytosis up to 42 on 9/24 Labs/Imaging personally reveiwed BUN/creatinine 48/0.9  CXR 9/24-reviewed by myself, hypoventilated lung fields, effusion on the left  Resolved Hospital Problem list     Assessment & Plan:  Acute post ERCP pancreatitis Bile leak  following cholecystectomy  -Continue bowel regimen -Continue TPN -Schedule pain medication -GI, IR, surgery following -Biliary drain to gravity -NG tube to intermittent low suction for decompression  Septic shock -Lactate improving from 5.3-2.8 -Elevated procalcitonin at 16.7 -Continue IV fluids -Weaned off pressors -Culture positive for Enterobacterales -Trend leukocytosis -Continue antibiotics  Acute hypoxic respiratory failure -Continue submental oxygen -Aggressive pulmonary hygiene -Hypoventilating secondary to intra-abdominal process   Esophagitis with recent bleeding -N.p.o. with meds -Continue TPN -Continue twice daily Protonix  ICU delirium -Minimize medications that may affect his mental status -Ensure adequate sleep at night-was started on Ambien last evening -Stated he slept a little bit better -Reorientate as needed  Hx of HTN -Home medications on hold in the setting of hypotension  Follow CBC, already ordered Best practice:   Diet: TPN  Pain/Anxiety/Delirium protocol (if indicated): initiated  VAP protocol (if indicated): Not applicable DVT prophylaxis: lovenox GI prophylaxis: BID protonix Glucose control: SSI target range 140-180 Mobility: bedrest, mobilize as tolerated Code Status: Full  Family Communication: wife updated via phone- 9/23 Disposition: ICU   Labs   CBC: Recent Labs  Lab 05/28/20 0350 05/29/20 0841 05/31/20 0458 05/31/20 1327 06/01/20 0809  WBC 23.3* 23.6* 26.2* 15.6* 42.2*  NEUTROABS 19.0*  --   --   --  39.2*  HGB 8.8* 8.6* 8.2* 8.6* 7.8*  HCT 28.6* 29.5* 28.6* 30.8* 27.9*  MCV 90.2 91.9 92.6 96.3 95.9  PLT 321 304 311 290 824    Basic Metabolic Panel: Recent Labs  Lab 05/27/20 0355 05/27/20 0355 05/28/20 0350 05/29/20 0404 05/30/20 0514 05/30/20 0514 05/31/20 0458 05/31/20 1327 06/01/20 0516 06/01/20 1143 06/02/20 0550  NA 142   < > 142   < > 144  --  143 145 145  --  146*  K 4.2   < > 3.8   < > 3.8   <  > 4.7 4.2 5.3* 4.6 3.7  CL 102   < > 103   < > 109  --  111 115* 114*  --  116*  CO2 31   < > 28   < > 25  --  22 18* 21*  --  21*  GLUCOSE 344*   < > 297*   < > 260*  --  206* 69* 209*  --  228*  BUN 37*   < > 41*   < > 43*  --  47* 52* 51*  --  48*  CREATININE 0.75   < > 0.82   < > 0.99  --  1.05 1.39* 1.23  --  0.90  CALCIUM 8.4*   < > 8.2*   < > 8.2*  --  8.6* 8.6* 8.2*  --  8.4*  MG 2.1  --  2.1  --   --   --  2.4  --   --   --  2.3  PHOS 3.9  --  3.2  --   --   --  3.4  --   --   --  2.7   < > = values in this interval not displayed.   GFR: Estimated Creatinine Clearance: 82.9 mL/min (by C-G formula based on SCr of 0.9 mg/dL). Recent Labs  Lab 05/29/20 2353 05/31/20 0458 05/31/20 1327 05/31/20 1834 06/01/20 0809 06/01/20 6144 06/01/20 1157 06/02/20 0550  PROCALCITON  --   --   --   --  30.80  --   --  16.76  WBC 23.6* 26.2* 15.6*  --  42.2*  --   --   --   LATICACIDVEN  --   --   --  5.3* 2.9* 3.5* 2.8*  --     Liver Function Tests: Recent Labs  Lab 05/28/20 0350 05/31/20 0458 05/31/20 1327 06/01/20 0856 06/02/20 0550  AST 28 25 32 48* 27  ALT 29 36 33 39 30  ALKPHOS 303* 313* 472* 318* 246*  BILITOT 0.9 0.7 2.4* 1.2 1.1  PROT 5.7* 5.7* 5.5* 6.0* 5.6*  ALBUMIN 1.4* 1.3* 1.7* 1.7* 1.5*   No results for input(s): LIPASE, AMYLASE in the last 168 hours. No results for input(s): AMMONIA in the last 168 hours.  ABG    Component Value Date/Time   PHART 7.295 (L) 05/31/2020 1625   PCO2ART 38.6 05/31/2020 1625   PO2ART 62.0 (L) 05/31/2020 1625   HCO3 18.0 (L) 05/31/2020 1625   TCO2 26 04/30/2020 0241   ACIDBASEDEF 7.1 (H) 05/31/2020 1625   O2SAT 87.5 05/31/2020 1625     Coagulation Profile: Recent Labs  Lab 05/29/20 0404 05/31/20 1415  INR 1.4* 1.5*    Cardiac Enzymes: No results for input(s): CKTOTAL, CKMB, CKMBINDEX, TROPONINI in the last 168 hours.  HbA1C: Hgb A1c MFr Bld  Date/Time Value Ref Range Status  04/29/2020 02:02 AM 5.6 4.8 - 5.6 %  Final    Comment:    (NOTE) Pre diabetes:          5.7%-6.4%  Diabetes:              >6.4%  Glycemic control for   <7.0% adults with diabetes   02/13/2020 04:55 PM 6.0 (H) 4.8 - 5.6 % Final    Comment:    (NOTE) Pre diabetes:          5.7%-6.4% Diabetes:              >6.4% Glycemic control for   <7.0% adults with diabetes     CBG: Recent Labs  Lab 06/01/20 1602 06/01/20 1943 06/01/20 2334 06/02/20 0326 06/02/20 0820  GLUCAP 135* 210* 170* 169* 205*   The patient is critically ill with multiple organ systems failure and requires high complexity decision making for assessment and support, frequent evaluation and titration of therapies, application of advanced monitoring technologies and extensive interpretation of multiple databases. Critical Care Time devoted to patient care services described in this note independent of APP/resident time (if applicable)  is 31 minutes.   Sherrilyn Rist MD [facility] Personal pager: [phone] If unanswered, please page CCM On-call: [phone]

## 2020-06-02 NOTE — Progress Notes (Addendum)
PHARMACY - TOTAL PARENTERAL NUTRITION CONSULT NOTE  Indication:  Severe pancreatitis, biliary fistula  Patient Measurements: Height: 5' 9"  (175.3 cm) Weight: 83.1 kg (183 lb 3.2 oz) IBW/kg (Calculated) : 70.7 TPN AdjBW (KG): 86.1 Body mass index is 27.05 kg/m.  Assessment:  26 YOM admitted 04/26/20, post-ERCP 8/18 and biliary stent exchange for persistent postoperative bile leak related to recent cholecystectomy with abscess s/p percutaneous drainage. Patient developed acute pancreatitis, has had a prolonged hospitalization complicated by VDRF (extubated 8/24), AKI, encephalopathy, and afib. TPN initiated 9/11 for acute post-ERCP pancreatitis/ileus and persistent biliary leak. Significant gastric fluid retention from functional outlet obstruction. Pt still with very poor PO intake.  9/23 - acutely decompensated requiring pressors and non-rebreather. Patient now in septic shock.    Glucose / Insulin: A1c 5.6% - CBGs improving on continuous TPN.  Required 21 units mSSI + 30 units in TPN.  Electrolytes: Na 146, K 3.7 (goal >/= 4 for ileus), high CL, low CO2, CoCa down to 10.4 (none in TPN), Phos low normal, Mag down to 2.3 (none in TPN) Renal: SCr down 0.9, BUN down 48 LFTs / TGs: LFTs WNL except alk phos down to 246, tbili WNL, TG mildly elevated 162 Prealbumin / albumin: Prealbumin 8.2 (up). Albumin 1.5 Intake / Output; MIVF: UOP 0.4 mL/kg/hr, NG down to 838m, drain up to 5055m net -4.8L.  LBM 9/20.  Off LR 5056mr GI Imaging:  9/5: CT - severe pancreatitis 9/7: HIDA - persistent bile leak with accumulations/paracolic gutter 9/9: CT - pancreatitis (peripancreatic fat stranding and peripancreatic fluid, percutaneous drain at the gallbladder fossa, unchanged biliary stent with no intrahepatic biliary duct dilatation, bilateral pleural effusions 9/15: KUB - diffuse colonic stool, no bowel obstruction/free air 9/18: CT - mild colonic diverticulosis, pseudocyst involvement and less likely  abscess 9/23: KUB - no evidence of bowel obstruction/ileus 9/23: CT - Gastric ileus, dilated/fluid-filled esophagus, small volume abd ascites Surgeries / Procedures: 8/18: ERCP/biliary stent exchange 9/9: Unsuccessful ERCP 9/10: Paracentesis - 1.4L out 9/21: EGD- Esophagitis w/ active oozing and stigmata of recent bleeding, gastritis w/ hemorrhage, congested duodenal mucosa; NGT trauma  9/21: Paracentesis/biliary drain placement/cholangiogram/drain check- 3L removed  Central access: PICC placed 05/19/20 TPN start date: 05/19/20  Nutritional Goals (per RD rec on 05/18/20): kCal: 2300-2500, Protein: 130-145g, Fluid: >2.3L/day  Current Nutrition: TPN  Plan:  Continue TPN at goal rate of 100m7m, providing 132g AA, 336g CHO and 67g ILE for a total of 2342 kCal, meeting 100% of needs  Electrolytes in TPN: decr Na 30mE62m add back K 25 mEq/L, add back Mag at 2 mEq/L, incr Phos 5 mmol/L, continue with no Ca, max acetate Add standard MVI and trace elements to TPN  Continue rSSI Q4H + increase insulin in TPN to 35 units F/u AM labs, CBGs  [place]lee [place] D. Juri Dinning,Mina MarblermD, BCPS, BCCCPKapolei/2021, 9:35 AM

## 2020-06-02 NOTE — Plan of Care (Signed)
  Problem: Nutrition: Goal: Adequate nutrition will be maintained Outcome: Progressing   

## 2020-06-02 NOTE — Progress Notes (Addendum)
Referring Physician(s): Dr. Zenia Resides   Supervising Physician: Daryll Brod  Patient Status:  [facility] - In-pt  Chief Complaint: High-output biliary fistula s/p cholecystectomy s/p biloma drain placement 6/7. Post-ERCP pancreatitis with gastric outlet obstruction; S/p common bile duct stent placement, but with continued increased biloma drain output; S/p internal/external biliary drain placement 9/21. Capping of biliary drain attempted but patient developed severe pain and hypotension requiring pressor support and transfer to the ICU.   Subjective: Patient asleep, currently off pressors. He is receiving TPN. Vitals are stable. Per bedside RN patient remains confused. WBC has decreased significantly today to 18.5 (42.2 yesterday) and T. Bili is 1.1.    Allergies: Toradol [ketorolac tromethamine]  Medications: Prior to Admission medications   Medication Sig Start Date End Date Taking? Authorizing Provider  Calcium Carb-Cholecalciferol (CALCIUM+D3 PO) Take 1 tablet by mouth daily.   Yes [provider]  Coenzyme Q10 (COQ10) 400 MG CAPS Take 800 mg by mouth in the morning.    Yes [provider]  Cyanocobalamin (VITAMIN B-12) 5000 MCG TBDP Take 5,000 mcg by mouth daily.   Yes [provider]  GLUCOSAMINE-CHONDROITIN PO Take 1 tablet by mouth in the morning and at bedtime.    Yes [provider]  Javier Docker Oil 1000 MG CAPS Take 1,000 mg by mouth in the morning and at bedtime.    Yes [provider]  lisinopril-hydrochlorothiazide (ZESTORETIC) 10-12.5 MG tablet Take 1 tablet by mouth daily. 03/04/20  Yes [provider]  Moringa Oleifera (MORINGA PO) Take 1,000 mg by mouth 2 (two) times daily.   Yes [provider]  Multiple Vitamin (MULTIVITAMIN WITH MINERALS) TABS tablet Take 1 tablet by mouth daily.   Yes [provider]  ondansetron (ZOFRAN) 4 MG tablet Take 1 tablet (4 mg total) by mouth every 8 (eight) hours as needed for nausea  or vomiting. 04/25/20  Yes Mansouraty, Telford Nab., MD  OVER THE COUNTER MEDICATION Take 1 tablet by mouth See admin instructions. [facility] otc tablet- Take 1 tablet by mouth once a day   Yes [provider]  oxyCODONE (OXY IR/ROXICODONE) 5 MG immediate release tablet Take 5 mg by mouth 2 (two) times daily as needed (for pain).  04/25/20  Yes [provider]  pravastatin (PRAVACHOL) 80 MG tablet Take 40 mg by mouth in the morning and at bedtime.   Yes [provider]  Red Yeast Rice 600 MG CAPS Take 1,200 mg by mouth at bedtime.    Yes [provider]  sertraline (ZOLOFT) 100 MG tablet Take 100 mg by mouth daily. 11/08/19  Yes [provider]  TURMERIC PO Take 1,500 mg by mouth 2 (two) times daily.   Yes [provider]  zolpidem (AMBIEN) 5 MG tablet Take 5 mg by mouth at bedtime. 12/29/19  Yes [provider]  ibuprofen (ADVIL) 200 MG tablet Take 400-600 mg by mouth every 6 (six) hours as needed for headache or moderate pain.    [provider]     Vital Signs: BP 111/66   Pulse 89   Temp 97.6 F (36.4 C) (Axillary)   Resp 15   Ht 5\' 9"  (1.753 m)   Wt 183 lb 3.2 oz (83.1 kg)   SpO2 100%   BMI 27.05 kg/m   Physical Exam Constitutional:      General: He is not in acute distress.    Comments: Patient asleep. No discomfort or distress observed.   Cardiovascular:     Rate  and Rhythm: Normal rate and regular rhythm.  Pulmonary:     Effort: Pulmonary effort is normal.  Abdominal:     Palpations: Abdomen is soft.     Comments: NG tube in place. Anterior drain (biloma drain) in place. Insertion site c/d/i.  Scant amount of non-bloody, thin, serous-appearing fluid.  Posterior (internal/external biliary drain) in place. Insertion site c/d/i.  Approximately 50 cc dark, bilious output in collection bag.  Skin:    General: Skin is warm and dry.     Imaging: DG Abd 1 View  Result Date: 05/31/2020 CLINICAL DATA:  Check  gastric catheter placement EXAM: ABDOMEN - 1 VIEW COMPARISON:  CT from earlier in the same day. FINDINGS: Scattered large and small bowel gas is noted. Cholecystostomy tube as well as internal external biliary drain is noted with a CBD stent in place. Gastric catheter is coiled within the stomach. No other focal abnormality is noted. IMPRESSION: Gastric catheter coiled within the stomach. Electronically Signed   By: Inez Catalina M.D.   On: 05/31/2020 20:42   DG Abd 1 View  Result Date: 05/31/2020 CLINICAL DATA:  Abdominal distension. EXAM: ABDOMEN - 1 VIEW COMPARISON:  May 27, 2020. FINDINGS: The bowel gas pattern is normal. Internal external biliary drain is noted passing through biliary stent, with distal tip in expected position of the distal duodenum. IMPRESSION: No evidence of bowel obstruction or ileus. Internal external biliary drain is noted. Electronically Signed   By: Marijo Conception M.D.   On: 05/31/2020 14:44   CT ABDOMEN PELVIS W CONTRAST  Result Date: 05/31/2020 CLINICAL DATA:  Pancreatitis EXAM: CT ABDOMEN AND PELVIS WITH CONTRAST TECHNIQUE: Multidetector CT imaging of the abdomen and pelvis was performed using the standard protocol following bolus administration of intravenous contrast. CONTRAST:  139mL OMNIPAQUE IOHEXOL 300 MG/ML  SOLN COMPARISON:  05/26/2020 FINDINGS: Lower chest: There is a moderate-sized left-sided pleural effusion that is only partially visualized. There is at least partial collapse of the left lower lobe. There are new coarse airspace opacities in the lingula and right lung base concerning infiltrates or aspiration. There is a small right-sided pleural effusion. The Ace off a Gus is dilated and fluid-filled. Hepatobiliary: There is an internal external biliary drain coursing through the patient's right hepatic lobe. The drain appears to be well position. Again noted is a percutaneous cholecystostomy tube. The gallbladder is decompressed.A common bile duct stent is  again noted. There is minimal intrahepatic biliary ductal dilatation. Pancreas: Again noted are findings of acute pancreatitis. There are persistent peripancreatic fluid collections which have changed minimally in size since the prior study. 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 Spleen: Unremarkable. Adrenals/Urinary Tract: --Adrenal glands: Unremarkable. --Right kidney/ureter: Are nonobstructing right-sided nephroliths. --Left kidney/ureter: There is a nonobstructing stone in the upper pole the left kidney. --Urinary bladder: The urinary bladder is decompressed and therefore is poorly evaluated. Stomach/Bowel: --Stomach/Duodenum: The stomach is significantly distended and contains an air-fluid level. There is asymmetric wall thickening of the lesser curvature of the stomach, likely reactive. --Small bowel: Unremarkable. --Colon: There are few scattered colonic diverticula without CT evidence for diverticulitis. There is some wall thickening of the splenic and hepatic flexures of the colon, presumably reactive in etiology. --Appendix: Normal. Vascular/Lymphatic: Atherosclerotic calcification is present within the non-aneurysmal abdominal aorta, without hemodynamically significant stenosis. The splenic artery appears to be patent. There appears to be moderate attenuation of the splenic vein and portal venous confluence as well as the  proximal main portal vein. --No retroperitoneal lymphadenopathy. --No mesenteric lymphadenopathy. --No pelvic or inguinal lymphadenopathy. Reproductive: Unremarkable Other: Again noted is a small to moderate volume of abdominal ascites. There is body wall edema, slightly progressed from prior study. Musculoskeletal. No acute displaced fractures. IMPRESSION: 1. Again noted are findings of acute pancreatitis with relatively stable peripancreatic fluid collections. 2. Findings consistent with a gastric ileus. The esophagus is dilated  and fluid-filled to at least the mid thorax. This patient would likely benefit from NG tube decompression. 3. Worsening airspace opacities at the lung bases concerning for developing infiltrates or aspiration. 4. Moderate-sized left-sided pleural effusion with at least partial collapse of the left lower lobe. There is a trace to small right-sided pleural effusion with mild adjacent atelectasis. 5. Stable small volume abdominal ascites. 6. Well-positioned internal/external biliary drain. Unchanged cholecystostomy tube. Aortic Atherosclerosis (ICD10-I70.0). Electronically Signed   By: Constance Holster M.D.   On: 05/31/2020 16:04   DG CHEST PORT 1 VIEW  Result Date: 06/01/2020 CLINICAL DATA:  Shortness of breath. EXAM: PORTABLE CHEST 1 VIEW FINDING: FINDING Mild enlargement the cardiac silhouette, which is accentuated by low lung volumes and portable technique. Similar appearance of low lung volumes with left basilar opacity. Small left pleural effusion. No discernible pneumothorax. Gastric tube courses below the diaphragm, coiled in the stomach. Right-sided PICC with the tip projecting at the superior cavoatrial junction. External device overlying the left chest, possibly a suction catheter. IMPRESSION: Similar appearance of a small left pleural effusion with overlying opacity representing either atelectasis, aspiration, and/or pneumonia. Electronically Signed   By: Margaretha Sheffield MD   On: 06/01/2020 08:23   DG CHEST PORT 1 VIEW  Result Date: 05/31/2020 CLINICAL DATA:  Hypoxemia. EXAM: PORTABLE CHEST 1 VIEW COMPARISON:  May 27, 2020. FINDINGS: Stable cardiomediastinal silhouette. No pneumothorax is noted. Stable bibasilar opacities are noted concerning for atelectasis or infiltrates. Small left pleural effusion is noted. Right PICC line is unchanged. Bony thorax is unremarkable. IMPRESSION: Stable bibasilar atelectasis or infiltrates. Small left pleural effusion is noted. Electronically Signed   By:  Marijo Conception M.D.   On: 05/31/2020 14:43   DG Abd Portable 1V  Result Date: 06/02/2020 CLINICAL DATA:  Initial evaluation for NG tube placement. EXAM: PORTABLE ABDOMEN - 1 VIEW COMPARISON:  Prior radiograph from 06/01/2020. FINDINGS: Enteric tube in place with tip seen overlying the gastric body, side hole beyond the GE junction. Visualized bowel gas pattern within normal limits. Pigtail drainage catheter overlies the right upper quadrant. Additional surgical drain and biliary stent remains in place. Persistent dense opacity at the left lung base, which could reflect a combination of effusion with atelectasis and/or consolidation. IMPRESSION: 1. Enteric tube in place with tip overlying the gastric body, side hole beyond the GE junction. 2. Persistent dense left basilar opacity, which could reflect a combination of effusion with atelectasis and/or consolidation. Electronically Signed   By: Jeannine Boga M.D.   On: 06/02/2020 03:50   DG Abd Portable 1V  Result Date: 06/01/2020 CLINICAL DATA:  Nasogastric tube placement. EXAM: PORTABLE ABDOMEN - 1 VIEW COMPARISON:  05/27/2020 FINDINGS: Nasogastric tube has been advanced, tip overlying the level of the distal stomach. Surgical drains and biliary stent in place. Bowel gas pattern is nonobstructive. There is residual contrast in the colon. There is opacity at the LEFT lung base consistent with consolidation and pleural effusion. IMPRESSION: Nasogastric tube tip to the level of the distal stomach. Electronically Signed   By: Nolon Nations M.D.  On: 06/01/2020 14:24   IR CHOLANGIOGRAM EXISTING TUBE  Result Date: 05/30/2020 INDICATION: 64 year old male with history of complex postoperative course after subtotal cholecystectomy for acute gangrenous cholecystitis in May 4888 complicated by biloma formation requiring percutaneous drainage on 02/13/2020 and placement of metallic common bile duct stent on 04/26/2020. Most recently, the patient has  developed pancreatitis and duodenitis with ERCP findings consistent with obstruction of the distal common bile duct stent and subsequently high output from the biloma drain. Percutaneous cholangiogram and internal external biliary drain placement is requested for decompression and management of biliary fluid losses. EXAM: 1. Ultrasound-guided paracentesis 2. Cholangiogram via indwelling gallbladder fossa drain 3. Percutaneous cholangiogram 4. Placement of right-sided internal external biliary drain MEDICATIONS: Zosyn 3.375 g; The antibiotic was administered within an appropriate time frame prior to the initiation of the procedure. ANESTHESIA/SEDATION: Moderate (conscious) sedation was employed during this procedure. A total of Versed 2 mg and Fentanyl 75 mcg was administered intravenously. Moderate Sedation Time: 55 minutes. The patient's level of consciousness and vital signs were monitored continuously by radiology nursing throughout the procedure under my direct supervision. FLUOROSCOPY TIME:  Fluoroscopy Time: 17 minutes 18 seconds (81 mGy). COMPLICATIONS: None immediate. PROCEDURE: Informed written consent was obtained from the patient after a thorough discussion of the procedural risks, benefits and alternatives. All questions were addressed. Maximal Sterile Barrier Technique was utilized including caps, mask, sterile gowns, sterile gloves, sterile drape, hand hygiene and skin antiseptic. A timeout was performed prior to the initiation of the procedure. Preprocedure ultrasound demonstrated moderate to large volume ascites, including in the perihepatic space. The largest fluid pocket visualized in the right lower quadrant. Therefore the right lower quadrant was prepped and draped in standard fashion. The puncture site was planned. Subdermal Local anesthesia was provided with 1% lidocaine with 1:100,000 epinephrine, followed by deeper local anesthetic to the level of the peritoneum under ultrasound guidance. A 5  [place] Yueh needle was advanced into the peritoneal fluid under ultrasound guidance. A total of 3 L of clear, straw-colored fluid was removed during the course of the procedure. The right upper quadrant and indwelling biloma drain were prepped and draped in standard fashion. Hand injection of contrast via the indwelling biloma drain demonstrated appropriate position within the gallbladder fossa as well as a patent cystic duct. Contrast was visualized within the metallic biliary stent and filled retrograde into the right, nondilated bile ducts. The distal common bile duct stent was occluded. A right posterior duct was identified is in adequate puncture site for biliary drain. A small amount of subdermal Local anesthesia was provided to the right mid axillary line at planned entry site as well as deeper along the hepatic capsule. Under fluoroscopic guidance with multiple obliquities, a 22 gauge Chiba needle was directed toward the opacified right posterior duct. The duct was punctured successfully, confirmed by gentle hand injection of contrast. A Nitrex wire was then directed into the right posterior duct through the metallic common bile duct stent and into the duodenum without difficulty. A 6 French coaxial introducer set was placed over the Nitrex wire. The inner cannula were removed and an Amplatz wire was directed adjacent to the Nitrex wire into the duodenum near the ligament of Treitz. Serial dilation was performed with 8 and 10 [place] dilators. A 10.2 [place] biliary drain with 10 cm of custom cut extra sideholes was then introduced with the pigtail portion coiled in the fourth portion of the duodenum near the ligament of Treitz. Hand injection of contrast demonstrated opacification  of the nondilated biliary tree which passed freely into the small bowel. The drain was affixed to the skin with 0 Prolene suture and a Stay Fix dressing. The patient tolerated the procedure well and was returned to the floor in  stable condition. IMPRESSION: 1. Occluded indwelling metallic biliary stent at the distal end, presumed secondary to edema related to duodenitis/pancreatitis. 2. Indwelling biloma drain in satisfactory position. The cystic duct is patent. 3. Successful placement of an internal external biliary drain via right posterior bile ducts. The distal pigtail portion of the drain is positioned near the ligament of Treitz. 4. Ultrasound-guided paracentesis yielding 3 L of clear, straw-colored fluid. PLAN: Recommend capping biloma drain once any post-procedural hemobilia has resolved. Recommend capping trial of biliary drain 12-24 hours after capping of biloma drain. IR will follow closely. Electronically Signed   By: Ruthann Cancer MD   On: 05/30/2020 08:22   IR INT EXT BILIARY DRAIN WITH CHOLANGIOGRAM  Result Date: 05/30/2020 INDICATION: 64 year old male with history of complex postoperative course after subtotal cholecystectomy for acute gangrenous cholecystitis in May 1610 complicated by biloma formation requiring percutaneous drainage on 02/13/2020 and placement of metallic common bile duct stent on 04/26/2020. Most recently, the patient has developed pancreatitis and duodenitis with ERCP findings consistent with obstruction of the distal common bile duct stent and subsequently high output from the biloma drain. Percutaneous cholangiogram and internal external biliary drain placement is requested for decompression and management of biliary fluid losses. EXAM: 1. Ultrasound-guided paracentesis 2. Cholangiogram via indwelling gallbladder fossa drain 3. Percutaneous cholangiogram 4. Placement of right-sided internal external biliary drain MEDICATIONS: Zosyn 3.375 g; The antibiotic was administered within an appropriate time frame prior to the initiation of the procedure. ANESTHESIA/SEDATION: Moderate (conscious) sedation was employed during this procedure. A total of Versed 2 mg and Fentanyl 75 mcg was administered  intravenously. Moderate Sedation Time: 55 minutes. The patient's level of consciousness and vital signs were monitored continuously by radiology nursing throughout the procedure under my direct supervision. FLUOROSCOPY TIME:  Fluoroscopy Time: 17 minutes 18 seconds (81 mGy). COMPLICATIONS: None immediate. PROCEDURE: Informed written consent was obtained from the patient after a thorough discussion of the procedural risks, benefits and alternatives. All questions were addressed. Maximal Sterile Barrier Technique was utilized including caps, mask, sterile gowns, sterile gloves, sterile drape, hand hygiene and skin antiseptic. A timeout was performed prior to the initiation of the procedure. Preprocedure ultrasound demonstrated moderate to large volume ascites, including in the perihepatic space. The largest fluid pocket visualized in the right lower quadrant. Therefore the right lower quadrant was prepped and draped in standard fashion. The puncture site was planned. Subdermal Local anesthesia was provided with 1% lidocaine with 1:100,000 epinephrine, followed by deeper local anesthetic to the level of the peritoneum under ultrasound guidance. A 5 [place] Yueh needle was advanced into the peritoneal fluid under ultrasound guidance. A total of 3 L of clear, straw-colored fluid was removed during the course of the procedure. The right upper quadrant and indwelling biloma drain were prepped and draped in standard fashion. Hand injection of contrast via the indwelling biloma drain demonstrated appropriate position within the gallbladder fossa as well as a patent cystic duct. Contrast was visualized within the metallic biliary stent and filled retrograde into the right, nondilated bile ducts. The distal common bile duct stent was occluded. A right posterior duct was identified is in adequate puncture site for biliary drain. A small amount of subdermal Local anesthesia was provided to the right mid axillary  line at planned  entry site as well as deeper along the hepatic capsule. Under fluoroscopic guidance with multiple obliquities, a 22 gauge Chiba needle was directed toward the opacified right posterior duct. The duct was punctured successfully, confirmed by gentle hand injection of contrast. A Nitrex wire was then directed into the right posterior duct through the metallic common bile duct stent and into the duodenum without difficulty. A 6 French coaxial introducer set was placed over the Nitrex wire. The inner cannula were removed and an Amplatz wire was directed adjacent to the Nitrex wire into the duodenum near the ligament of Treitz. Serial dilation was performed with 8 and 10 [place] dilators. A 10.2 [place] biliary drain with 10 cm of custom cut extra sideholes was then introduced with the pigtail portion coiled in the fourth portion of the duodenum near the ligament of Treitz. Hand injection of contrast demonstrated opacification of the nondilated biliary tree which passed freely into the small bowel. The drain was affixed to the skin with 0 Prolene suture and a Stay Fix dressing. The patient tolerated the procedure well and was returned to the floor in stable condition. IMPRESSION: 1. Occluded indwelling metallic biliary stent at the distal end, presumed secondary to edema related to duodenitis/pancreatitis. 2. Indwelling biloma drain in satisfactory position. The cystic duct is patent. 3. Successful placement of an internal external biliary drain via right posterior bile ducts. The distal pigtail portion of the drain is positioned near the ligament of Treitz. 4. Ultrasound-guided paracentesis yielding 3 L of clear, straw-colored fluid. PLAN: Recommend capping biloma drain once any post-procedural hemobilia has resolved. Recommend capping trial of biliary drain 12-24 hours after capping of biloma drain. IR will follow closely. Electronically Signed   By: Ruthann Cancer MD   On: 05/30/2020 08:22   IR Paracentesis  Result  Date: 05/30/2020 INDICATION: 64 year old male with history of complex postoperative course after subtotal cholecystectomy for acute gangrenous cholecystitis in May 0272 complicated by biloma formation requiring percutaneous drainage on 02/13/2020 and placement of metallic common bile duct stent on 04/26/2020. Most recently, the patient has developed pancreatitis and duodenitis with ERCP findings consistent with obstruction of the distal common bile duct stent and subsequently high output from the biloma drain. Percutaneous cholangiogram and internal external biliary drain placement is requested for decompression and management of biliary fluid losses. EXAM: 1. Ultrasound-guided paracentesis 2. Cholangiogram via indwelling gallbladder fossa drain 3. Percutaneous cholangiogram 4. Placement of right-sided internal external biliary drain MEDICATIONS: Zosyn 3.375 g; The antibiotic was administered within an appropriate time frame prior to the initiation of the procedure. ANESTHESIA/SEDATION: Moderate (conscious) sedation was employed during this procedure. A total of Versed 2 mg and Fentanyl 75 mcg was administered intravenously. Moderate Sedation Time: 55 minutes. The patient's level of consciousness and vital signs were monitored continuously by radiology nursing throughout the procedure under my direct supervision. FLUOROSCOPY TIME:  Fluoroscopy Time: 17 minutes 18 seconds (81 mGy). COMPLICATIONS: None immediate. PROCEDURE: Informed written consent was obtained from the patient after a thorough discussion of the procedural risks, benefits and alternatives. All questions were addressed. Maximal Sterile Barrier Technique was utilized including caps, mask, sterile gowns, sterile gloves, sterile drape, hand hygiene and skin antiseptic. A timeout was performed prior to the initiation of the procedure. Preprocedure ultrasound demonstrated moderate to large volume ascites, including in the perihepatic space. The largest  fluid pocket visualized in the right lower quadrant. Therefore the right lower quadrant was prepped and draped in standard fashion. The puncture site  was planned. Subdermal Local anesthesia was provided with 1% lidocaine with 1:100,000 epinephrine, followed by deeper local anesthetic to the level of the peritoneum under ultrasound guidance. A 5 [place] Yueh needle was advanced into the peritoneal fluid under ultrasound guidance. A total of 3 L of clear, straw-colored fluid was removed during the course of the procedure. The right upper quadrant and indwelling biloma drain were prepped and draped in standard fashion. Hand injection of contrast via the indwelling biloma drain demonstrated appropriate position within the gallbladder fossa as well as a patent cystic duct. Contrast was visualized within the metallic biliary stent and filled retrograde into the right, nondilated bile ducts. The distal common bile duct stent was occluded. A right posterior duct was identified is in adequate puncture site for biliary drain. A small amount of subdermal Local anesthesia was provided to the right mid axillary line at planned entry site as well as deeper along the hepatic capsule. Under fluoroscopic guidance with multiple obliquities, a 22 gauge Chiba needle was directed toward the opacified right posterior duct. The duct was punctured successfully, confirmed by gentle hand injection of contrast. A Nitrex wire was then directed into the right posterior duct through the metallic common bile duct stent and into the duodenum without difficulty. A 6 French coaxial introducer set was placed over the Nitrex wire. The inner cannula were removed and an Amplatz wire was directed adjacent to the Nitrex wire into the duodenum near the ligament of Treitz. Serial dilation was performed with 8 and 10 [place] dilators. A 10.2 [place] biliary drain with 10 cm of custom cut extra sideholes was then introduced with the pigtail portion coiled in the  fourth portion of the duodenum near the ligament of Treitz. Hand injection of contrast demonstrated opacification of the nondilated biliary tree which passed freely into the small bowel. The drain was affixed to the skin with 0 Prolene suture and a Stay Fix dressing. The patient tolerated the procedure well and was returned to the floor in stable condition. IMPRESSION: 1. Occluded indwelling metallic biliary stent at the distal end, presumed secondary to edema related to duodenitis/pancreatitis. 2. Indwelling biloma drain in satisfactory position. The cystic duct is patent. 3. Successful placement of an internal external biliary drain via right posterior bile ducts. The distal pigtail portion of the drain is positioned near the ligament of Treitz. 4. Ultrasound-guided paracentesis yielding 3 L of clear, straw-colored fluid. PLAN: Recommend capping biloma drain once any post-procedural hemobilia has resolved. Recommend capping trial of biliary drain 12-24 hours after capping of biloma drain. IR will follow closely. Electronically Signed   By: Ruthann Cancer MD   On: 05/30/2020 08:22    Labs:  CBC: Recent Labs    05/31/20 0458 05/31/20 1327 06/01/20 0809 06/02/20 1026  WBC 26.2* 15.6* 42.2* 18.5*  HGB 8.2* 8.6* 7.8* 7.7*  HCT 28.6* 30.8* 27.9* 26.8*  PLT 311 290 296 250    COAGS: Recent Labs    02/02/20 2115 05/16/20 1905 05/29/20 0404 05/31/20 1415  INR 1.1 1.3* 1.4* 1.5*    BMP: Recent Labs    05/31/20 0458 05/31/20 0458 05/31/20 1327 06/01/20 0516 06/01/20 1143 06/02/20 0550  NA 143  --  145 145  --  146*  K 4.7   < > 4.2 5.3* 4.6 3.7  CL 111  --  115* 114*  --  116*  CO2 22  --  18* 21*  --  21*  GLUCOSE 206*  --  69* 209*  --  228*  BUN 47*  --  52* 51*  --  48*  CALCIUM 8.6*  --  8.6* 8.2*  --  8.4*  CREATININE 1.05  --  1.39* 1.23  --  0.90  GFRNONAA >60  --  53* >60  --  >60  GFRAA >60  --  >60 >60  --  >60   < > = values in this interval not displayed.    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 Post-ERCP pancreatitis with gastric outlet obstruction; S/p common bile duct stent placement, but with continued increased biloma drain output; S/p internal/external biliary drain placement 9/21. Capping of biliary drain attempted but patient developed severe pain and hypotension requiring pressor support and transfer to the ICU.   425 cc output for biliary drain in Epic. 75 cc output for biloma drain documented. Continue to flush drains and document output each shift. Other plans per primary teams.   IR will continue to follow.   Electronically Signed: Soyla Dryer, AGACNP-BC [phone] 06/02/2020, 2:06 PM   I spent a total of 15 Minutes at the the patient's bedside AND on the patient's hospital floor or unit, greater than 50% of which was counseling/coordinating care for biliary and biloma drains.

## 2020-06-02 NOTE — Progress Notes (Signed)
Subjective: Pt transferred to ICU. NGT put out quite a bit.  Complains of lots of pain this AM and wants to sit up.    Objective: Vital signs in last 24 hours: Temp:  [96.6 F (35.9 C)-100 F (37.8 C)] 97.1 F (36.2 C) (09/25 0800) Pulse Rate:  [86-131] 87 (09/25 1000) Resp:  [13-32] 16 (09/25 1000) BP: (71-167)/(53-101) 100/69 (09/25 1000) SpO2:  [81 %-100 %] 100 % (09/25 1000) Weight:  [83.1 kg] 83.1 kg (09/25 0500) Last BM Date: 05/25/20  Intake/Output from previous day: 09/24 0701 - 09/25 0700 In: 2614.1 [I.V.:2194.4; IV Piggyback:399.7] Out: 2125 [Urine:750; Emesis/NG output:875; Drains:500] Intake/Output this shift: Total I/O In: 300.1 [I.V.:300.1] Out: 130 [Emesis/NG output:100; Drains:30]  PE: General: outbursts of cries with brief sharp pains. Neuro: alert and interactive but mildly confused HEENT: NG tube in place, draining gastric contents Resp: mildly increased work of breathing, on [place]. Abdomen: distended but soft, RUQ PBD with bilious fluid in bag. Percutaneous drain with scant serous fluid.  Lab Results:  Recent Labs    06/01/20 0809 06/02/20 1026  WBC 42.2* PENDING  HGB 7.8* 7.7*  HCT 27.9* 26.8*  PLT 296 250   BMET Recent Labs    06/01/20 0516 06/01/20 0516 06/01/20 1143 06/02/20 0550  NA 145  --   --  146*  K 5.3*   < > 4.6 3.7  CL 114*  --   --  116*  CO2 21*  --   --  21*  GLUCOSE 209*  --   --  228*  BUN 51*  --   --  48*  CREATININE 1.23  --   --  0.90  CALCIUM 8.2*  --   --  8.4*   < > = values in this interval not displayed.   PT/INR Recent Labs    05/31/20 1415  LABPROT 17.7*  INR 1.5*   CMP     Component Value Date/Time   NA 146 (H) 06/02/2020 0550   K 3.7 06/02/2020 0550   CL 116 (H) 06/02/2020 0550   CO2 21 (L) 06/02/2020 0550   GLUCOSE 228 (H) 06/02/2020 0550   BUN 48 (H) 06/02/2020 0550   CREATININE 0.90 06/02/2020 0550   CALCIUM 8.4 (L) 06/02/2020 0550   PROT 5.6 (L) 06/02/2020 0550   ALBUMIN 1.5 (L)  06/02/2020 0550   AST 27 06/02/2020 0550   ALT 30 06/02/2020 0550   ALKPHOS 246 (H) 06/02/2020 0550   BILITOT 1.1 06/02/2020 0550   GFRNONAA >60 06/02/2020 0550   GFRAA >60 06/02/2020 0550   Lipase     Component Value Date/Time   LIPASE 27 05/16/2020 1905       Studies/Results: DG Abd 1 View  Result Date: 05/31/2020 CLINICAL DATA:  Check gastric catheter placement EXAM: ABDOMEN - 1 VIEW COMPARISON:  CT from earlier in the same day. FINDINGS: Scattered large and small bowel gas is noted. Cholecystostomy tube as well as internal external biliary drain is noted with a CBD stent in place. Gastric catheter is coiled within the stomach. No other focal abnormality is noted. IMPRESSION: Gastric catheter coiled within the stomach. Electronically Signed   By: Inez Catalina M.D.   On: 05/31/2020 20:42   DG Abd 1 View  Result Date: 05/31/2020 CLINICAL DATA:  Abdominal distension. EXAM: ABDOMEN - 1 VIEW COMPARISON:  May 27, 2020. FINDINGS: The bowel gas pattern is normal. Internal external biliary drain is noted passing through biliary stent, with distal tip in expected  position of the distal duodenum. IMPRESSION: No evidence of bowel obstruction or ileus. Internal external biliary drain is noted. Electronically Signed   By: Marijo Conception M.D.   On: 05/31/2020 14:44   CT ABDOMEN PELVIS W CONTRAST  Result Date: 05/31/2020 CLINICAL DATA:  Pancreatitis EXAM: CT ABDOMEN AND PELVIS WITH CONTRAST TECHNIQUE: Multidetector CT imaging of the abdomen and pelvis was performed using the standard protocol following bolus administration of intravenous contrast. CONTRAST:  19mL OMNIPAQUE IOHEXOL 300 MG/ML  SOLN COMPARISON:  05/26/2020 FINDINGS: Lower chest: There is a moderate-sized left-sided pleural effusion that is only partially visualized. There is at least partial collapse of the left lower lobe. There are new coarse airspace opacities in the lingula and right lung base concerning infiltrates or  aspiration. There is a small right-sided pleural effusion. The Ace off a Gus is dilated and fluid-filled. Hepatobiliary: There is an internal external biliary drain coursing through the patient's right hepatic lobe. The drain appears to be well position. Again noted is a percutaneous cholecystostomy tube. The gallbladder is decompressed.A common bile duct stent is again noted. There is minimal intrahepatic biliary ductal dilatation. Pancreas: Again noted are findings of acute pancreatitis. There are persistent peripancreatic fluid collections which have changed minimally in size since the prior study. 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 Spleen: Unremarkable. Adrenals/Urinary Tract: --Adrenal glands: Unremarkable. --Right kidney/ureter: Are nonobstructing right-sided nephroliths. --Left kidney/ureter: There is a nonobstructing stone in the upper pole the left kidney. --Urinary bladder: The urinary bladder is decompressed and therefore is poorly evaluated. Stomach/Bowel: --Stomach/Duodenum: The stomach is significantly distended and contains an air-fluid level. There is asymmetric wall thickening of the lesser curvature of the stomach, likely reactive. --Small bowel: Unremarkable. --Colon: There are few scattered colonic diverticula without CT evidence for diverticulitis. There is some wall thickening of the splenic and hepatic flexures of the colon, presumably reactive in etiology. --Appendix: Normal. Vascular/Lymphatic: Atherosclerotic calcification is present within the non-aneurysmal abdominal aorta, without hemodynamically significant stenosis. The splenic artery appears to be patent. There appears to be moderate attenuation of the splenic vein and portal venous confluence as well as the proximal main portal vein. --No retroperitoneal lymphadenopathy. --No mesenteric lymphadenopathy. --No pelvic or inguinal lymphadenopathy. Reproductive: Unremarkable  Other: Again noted is a small to moderate volume of abdominal ascites. There is body wall edema, slightly progressed from prior study. Musculoskeletal. No acute displaced fractures. IMPRESSION: 1. Again noted are findings of acute pancreatitis with relatively stable peripancreatic fluid collections. 2. Findings consistent with a gastric ileus. The esophagus is dilated and fluid-filled to at least the mid thorax. This patient would likely benefit from NG tube decompression. 3. Worsening airspace opacities at the lung bases concerning for developing infiltrates or aspiration. 4. Moderate-sized left-sided pleural effusion with at least partial collapse of the left lower lobe. There is a trace to small right-sided pleural effusion with mild adjacent atelectasis. 5. Stable small volume abdominal ascites. 6. Well-positioned internal/external biliary drain. Unchanged cholecystostomy tube. Aortic Atherosclerosis (ICD10-I70.0). Electronically Signed   By: Constance Holster M.D.   On: 05/31/2020 16:04   DG CHEST PORT 1 VIEW  Result Date: 06/01/2020 CLINICAL DATA:  Shortness of breath. EXAM: PORTABLE CHEST 1 VIEW FINDING: FINDING Mild enlargement the cardiac silhouette, which is accentuated by low lung volumes and portable technique. Similar appearance of low lung volumes with left basilar opacity. Small left pleural effusion. No discernible pneumothorax. Gastric tube courses below the diaphragm, coiled in the stomach.  Right-sided PICC with the tip projecting at the superior cavoatrial junction. External device overlying the left chest, possibly a suction catheter. IMPRESSION: Similar appearance of a small left pleural effusion with overlying opacity representing either atelectasis, aspiration, and/or pneumonia. Electronically Signed   By: Margaretha Sheffield MD   On: 06/01/2020 08:23   DG CHEST PORT 1 VIEW  Result Date: 05/31/2020 CLINICAL DATA:  Hypoxemia. EXAM: PORTABLE CHEST 1 VIEW COMPARISON:  May 27, 2020.  FINDINGS: Stable cardiomediastinal silhouette. No pneumothorax is noted. Stable bibasilar opacities are noted concerning for atelectasis or infiltrates. Small left pleural effusion is noted. Right PICC line is unchanged. Bony thorax is unremarkable. IMPRESSION: Stable bibasilar atelectasis or infiltrates. Small left pleural effusion is noted. Electronically Signed   By: Marijo Conception M.D.   On: 05/31/2020 14:43   DG Abd Portable 1V  Result Date: 06/02/2020 CLINICAL DATA:  Initial evaluation for NG tube placement. EXAM: PORTABLE ABDOMEN - 1 VIEW COMPARISON:  Prior radiograph from 06/01/2020. FINDINGS: Enteric tube in place with tip seen overlying the gastric body, side hole beyond the GE junction. Visualized bowel gas pattern within normal limits. Pigtail drainage catheter overlies the right upper quadrant. Additional surgical drain and biliary stent remains in place. Persistent dense opacity at the left lung base, which could reflect a combination of effusion with atelectasis and/or consolidation. IMPRESSION: 1. Enteric tube in place with tip overlying the gastric body, side hole beyond the GE junction. 2. Persistent dense left basilar opacity, which could reflect a combination of effusion with atelectasis and/or consolidation. Electronically Signed   By: Jeannine Boga M.D.   On: 06/02/2020 03:50   DG Abd Portable 1V  Result Date: 06/01/2020 CLINICAL DATA:  Nasogastric tube placement. EXAM: PORTABLE ABDOMEN - 1 VIEW COMPARISON:  05/27/2020 FINDINGS: Nasogastric tube has been advanced, tip overlying the level of the distal stomach. Surgical drains and biliary stent in place. Bowel gas pattern is nonobstructive. There is residual contrast in the colon. There is opacity at the LEFT lung base consistent with consolidation and pleural effusion. IMPRESSION: Nasogastric tube tip to the level of the distal stomach. Electronically Signed   By: Nolon Nations M.D.   On: 06/01/2020 14:24     Anti-infectives: Anti-infectives (From admission, onward)   Start     Dose/Rate Route Frequency Ordered Stop   06/02/20 0300  meropenem (MERREM) 2 g in sodium chloride 0.9 % 100 mL IVPB        2 g 200 mL/hr over 30 Minutes Intravenous Every 8 hours 06/02/20 0248     06/02/20 0300  vancomycin (VANCOCIN) IVPB 1000 mg/200 mL premix        1,000 mg 200 mL/hr over 60 Minutes Intravenous Every 12 hours 06/02/20 0248     06/01/20 1000  piperacillin-tazobactam (ZOSYN) IVPB 3.375 g  Status:  Discontinued        3.375 g 12.5 mL/hr over 240 Minutes Intravenous Every 8 hours 06/01/20 0952 06/02/20 0248   05/29/20 0800  piperacillin-tazobactam (ZOSYN) IVPB 3.375 g        3.375 g 12.5 mL/hr over 240 Minutes Intravenous To Radiology 05/28/20 1328 05/29/20 1943   05/04/20 1230  meropenem (MERREM) 1 g in sodium chloride 0.9 % 100 mL IVPB  Status:  Discontinued        1 g 200 mL/hr over 30 Minutes Intravenous Every 8 hours 05/04/20 1156 05/19/20 0826   05/01/20 1400  piperacillin-tazobactam (ZOSYN) IVPB 3.375 g        3.375 g  12.5 mL/hr over 240 Minutes Intravenous Every 8 hours 05/01/20 1013 05/03/20 0907   04/29/20 2000  vancomycin (VANCOCIN) IVPB 1000 mg/200 mL premix  Status:  Discontinued        1,000 mg 200 mL/hr over 60 Minutes Intravenous Every 12 hours 04/29/20 0653 04/30/20 0831   04/29/20 0745  vancomycin (VANCOREADY) IVPB 1500 mg/300 mL        1,500 mg 150 mL/hr over 120 Minutes Intravenous  Once 04/29/20 0653 04/29/20 1129   04/26/20 2300  piperacillin-tazobactam (ZOSYN) IVPB 3.375 g        3.375 g 12.5 mL/hr over 240 Minutes Intravenous Every 8 hours 04/26/20 1716 05/01/20 0559   04/26/20 1700  piperacillin-tazobactam (ZOSYN) IVPB 3.375 g        3.375 g 100 mL/hr over 30 Minutes Intravenous  Once 04/26/20 1655 04/26/20 1844       Assessment/Plan 64 yo male s/p subtotal cholecystectomy, complicated by persistent bile leak, 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 S/p Perc TBD. - Keep biliary drain externalized until patient clinically improves. Trend daily LFTs. Suspect there is a source of sepsis in addition to cholangitis, as PBD was only capped for a few hours and the WBC continues to rise with near normal bilirubin.   - Agree with antibiotics for sepsis. No evidence of infected peripancreatic necrosis on CT scan from yesterday. - Remain NPO, keep NG tube in place. When patient is having regular bowel function, recommend attempting placement of an [place] feeding tube to trial enteral feeds. Please hold miralax given gastric outlet obstruction. Continue TPN for now. - Optimize nutrition - continue TPN - Surgery will continue to follow   LOS: 36 days   Milus Height, MD FACS Surgical Oncology, General Surgery, Trauma and [place] [facility], [place] for weekday/non holidays Check [email or web address] for coverage night/weekend/holidays  Do not use SecureChat as it is not reliable for timely patient care.      06/02/20 11:19 AM

## 2020-06-02 NOTE — Progress Notes (Signed)
PHARMACY - PHYSICIAN COMMUNICATION CRITICAL VALUE ALERT - BLOOD CULTURE IDENTIFICATION (BCID)  Adam Fowler is an 64 y.o. male who presented to [facility] on 04/26/2020 with a chief complaint of pancreatitis  Assessment:  Marked elevation in WBC from 9/23 to 9/24 (15.6>>>42.2). Blood cultures with enterococcal and kleb pneumo bacteremia.   Name of physician (or Provider) Contacted: Dr. Lucile Shutters ([facility])  Current antibiotics: Zosyn  Changes to prescribed antibiotics recommended:  DC Zosyn Start vancomycin 1000 mg IV q12h Merrem 2g IV q8h Trend WBC, temp, renal function  F/U infectious work-up Drug levels as indicated   Results for orders placed or performed during the hospital encounter of 04/26/20  Blood Culture ID Panel (Reflexed) (Collected: 05/31/2020  6:37 PM)  Result Value Ref Range   Enterococcus faecalis DETECTED (A) NOT DETECTED   Enterococcus Faecium NOT DETECTED NOT DETECTED   Listeria monocytogenes NOT DETECTED NOT DETECTED   Staphylococcus species NOT DETECTED NOT DETECTED   Staphylococcus aureus (BCID) NOT DETECTED NOT DETECTED   Staphylococcus epidermidis NOT DETECTED NOT DETECTED   Staphylococcus lugdunensis NOT DETECTED NOT DETECTED   Streptococcus species NOT DETECTED NOT DETECTED   Streptococcus agalactiae NOT DETECTED NOT DETECTED   Streptococcus pneumoniae NOT DETECTED NOT DETECTED   Streptococcus pyogenes NOT DETECTED NOT DETECTED   A.calcoaceticus-baumannii NOT DETECTED NOT DETECTED   Bacteroides fragilis NOT DETECTED NOT DETECTED   Enterobacterales DETECTED (A) NOT DETECTED   Enterobacter cloacae complex NOT DETECTED NOT DETECTED   Escherichia coli NOT DETECTED NOT DETECTED   Klebsiella aerogenes NOT DETECTED NOT DETECTED   Klebsiella oxytoca NOT DETECTED NOT DETECTED   Klebsiella pneumoniae DETECTED (A) NOT DETECTED   Proteus species NOT DETECTED NOT DETECTED   Salmonella species NOT DETECTED NOT DETECTED   Serratia marcescens NOT DETECTED NOT DETECTED    Haemophilus influenzae NOT DETECTED NOT DETECTED   Neisseria meningitidis NOT DETECTED NOT DETECTED   Pseudomonas aeruginosa NOT DETECTED NOT DETECTED   Stenotrophomonas maltophilia NOT DETECTED NOT DETECTED   Candida albicans NOT DETECTED NOT DETECTED   Candida auris NOT DETECTED NOT DETECTED   Candida glabrata NOT DETECTED NOT DETECTED   Candida krusei NOT DETECTED NOT DETECTED   Candida parapsilosis NOT DETECTED NOT DETECTED   Candida tropicalis NOT DETECTED NOT DETECTED   Cryptococcus neoformans/gattii NOT DETECTED NOT DETECTED   CTX-M ESBL NOT DETECTED NOT DETECTED   Carbapenem resistance IMP NOT DETECTED NOT DETECTED   Carbapenem resistance KPC NOT DETECTED NOT DETECTED   Carbapenem resistance NDM NOT DETECTED NOT DETECTED   Carbapenem resist OXA 48 LIKE NOT DETECTED NOT DETECTED   Vancomycin resistance NOT DETECTED NOT DETECTED   Carbapenem resistance VIM NOT DETECTED NOT DETECTED   Narda Bonds, PharmD, BCPS Clinical Pharmacist Phone: [phone]

## 2020-06-02 NOTE — Consult Note (Signed)
[facility]                                                                                        Patient Identification: Patient Name: Adam Fowler MRN: [id number] [place] Date: 04/26/2020 12:03 PM Today's Date: 06/02/2020 Reason for consult: Polymicrobial Bacteremia   Principal Problem:   Post-ERCP acute pancreatitis Active Problems:   Severe sepsis ([place])   Polycythemia   AKI (acute kidney injury) ([place])   Metabolic acidosis   Acute respiratory failure with hypoxia (HCC)   Bilateral pleural effusion   Ileus (HCC)   Bile duct leak   Gastric outlet obstruction   Abdominal distention   ARDS (adult respiratory distress syndrome) ([place])   Biloma following surgery   Acute blood loss anemia   Antibiotics: Meropenem Day 1                   Vancomycin Day 1   Assessment 1. Polymicrobial Bacteremia ( E facealis, Enterobacteriales and Klebsiella pneumoniae) - Source is most likely abdominal given complicated abdominal pathology as noted below Subtotal cholecystectomy for acute gangrenous cholecystitis with gallbladder hydrops on 5/18 Gall bladder fossa drain placement 02/13/20.  S/p Biliary stent exchange 8/18 Severe post ERCP pancreatitis.   S/P internal/external biliary drain placement 9/21.  2. 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  3. On TPN   4. Bilateral Pleural effusion/ Moderate Ascites/Anasarca  5. Leukocytosis  Recommendations  Recommend to continue Vancomycin, pharmacy to dose and meropenem 2g iv q8 for coverage of all organisms isolated from blood cultures pending sensitivities. Patient has a PICC line that was placed 2 weeks ago and is getting TPN through it. There is a high probability of this being infected. I have orderd a pair of blood  cultures including one from the PICC line  Repeat CBC today ( ordered) Following   Rosiland Oz, MD [facility]   To contact the attending provider between 8A-5P or the covering provider during after hours 5P-8A, please log into the web site [email or web address] and access using universal [place] password for that web site. If you do not have the password, please call the hospital operator. __________________________________________________________________________________________________________ HPI and Hospital Course: 64 YO Male with PMH as below who was admitted on 5/15 for gallstone pancreatitis. Patient underwent ERCP x2 during admission. There was difficult cannulationduring ERCP and ultimately awire was able to be placed into the CBD to allowcannulationand abiliary sphincterotomy was performed.Patient underwent a subtotal cholecystectomy for acute gangrenous cholecystitis with gallbladder hydrops on 5/18. A drain was left in place postop. Patient was discharged on 5/19. He was seen in the office on 5/26 for follow-up where the drain output is changed from bilious to purulent in the preceding 24-48 hours. He was noted to have leukocytosis and hyperbilirubinemia for which she was readmitted on 5/28. He underwent a CT scan that showed a 8.6 cm complex fluid collection in the gallbladder fossa. The surgical drain course through the gallbladder fossa. He  underwent a HIDA that was negative for bile leak and showed a patent common bile duct. Patient was discharged home on 5/29. Patient was doing well after hospitalization follow-up in the clinic where drain was pulled. Patient was re-admitted 6/7-6/8 with CT scan findings suggestive of gallbladder fossa abscess. Underwent CT-guided gallbladder fossa abscess drain placement 6/7 and discharged home on Augmentin. Patient underwent removal of plastic biliary catheter, deployment of balloon  retrieval catheter, and placement of metallic biliary stent on 8/18. Patient having complaints of severe epigastric abdominal pain, nausea, and vomiting since after the ERCP procedure. Re-admitted on 8/19. Intubated on 8/22 and extubated on 8/24. 9/21 Underwent US guided paracentesis, Percutaneuos cholangiogram and placement of internal and external  bilary drains with findings as below. Patient became hypotensive 2 days ago and has been transferred to the ICU. Patient is on TPN. CTAP 9/23 showed acute pancreatitis with stable peripancreatic fluid collections and well-positioned internal/external biliary drain and cholecystostomy tube. Patient has a NGT for gastric ileus. He also has bilateral pleural effusion more on the rt side in CT 9/23  Findings: Please refer to procedural dictation for full description.  Moderate volume ascites, ultrasound guided paracentesis via right lower quadrant yielding 3 L clear, straw-colored fluid.  Indwelling biloma drain in good position, with patent cystic duct, and occlusion of distal aspect of indwelling biliary stent.  Right biliary 10.2 Fr biliary drain placed, with 10 cm of extra side holes cut into cathter.  Pigtail tip of drain is within 3rd/4th portion of duodenum.    ROS: limited. However, he denies having any fever, chills. Complains of pain and soreness at the surgical site. RN denies diarrhea. He has mitten in bilateral hands and is requesting to remove it.  Past Medical History:  Diagnosis Date  . History of kidney stones    passed  . Hypertension   . Insomnia    Past Surgical History:  Procedure Laterality Date  . ARTHROSCOPIC REPAIR ACL Bilateral    left, 2012; right 2004  . BILIARY STENT PLACEMENT  01/23/2020   Procedure: BILIARY STENT PLACEMENT;  Surgeon: Rush Landmark Telford Nab., MD;  Location: [facility];  Service: Gastroenterology;;  . BILIARY STENT PLACEMENT  03/21/2020   Procedure: BILIARY STENT PLACEMENT;  Surgeon: Irving Copas.,  MD;  Location: [facility];  Service: Gastroenterology;;  . BILIARY STENT PLACEMENT  04/25/2020   Procedure: BILIARY STENT PLACEMENT;  Surgeon: Irving Copas., MD;  Location: [facility];  Service: Gastroenterology;;  . CHOLECYSTECTOMY N/A 01/24/2020   Procedure: LAPAROSCOPIC CHOLECYSTECTOMY;  Surgeon: Jesusita Oka, MD;  Location: [facility];  Service: General;  Laterality: N/A;  . COLONOSCOPY    . ENDOSCOPIC RETROGRADE CHOLANGIOPANCREATOGRAPHY (ERCP) WITH PROPOFOL N/A 03/21/2020   Procedure: ENDOSCOPIC RETROGRADE CHOLANGIOPANCREATOGRAPHY (ERCP) WITH PROPOFOL;  Surgeon: Rush Landmark Telford Nab., MD;  Location: [facility];  Service: Gastroenterology;  Laterality: N/A;  . ENDOSCOPIC RETROGRADE CHOLANGIOPANCREATOGRAPHY (ERCP) WITH PROPOFOL N/A 04/25/2020   Procedure: ENDOSCOPIC RETROGRADE CHOLANGIOPANCREATOGRAPHY (ERCP) WITH PROPOFOL;  Surgeon: Rush Landmark Telford Nab., MD;  Location: [facility];  Service: Gastroenterology;  Laterality: N/A;  . ERCP N/A 01/21/2020   Procedure: ENDOSCOPIC RETROGRADE CHOLANGIOPANCREATOGRAPHY (ERCP);  Surgeon: Ladene Artist, MD;  Location: [facility] END[facility]COPY;  Service: Endoscopy;  Laterality: N/A;  . ERCP N/A 01/23/2020   Procedure: ENDOSCOPIC RETROGRADE CHOLANGIOPANCREATOGRAPHY (ERCP);  Surgeon: Irving Copas., MD;  Location: [facility];  Service: Gastroenterology;  Laterality: N/A;  . ESOPHAGOGASTRODUODENOSCOPY (EGD) WITH PROPOFOL N/A 05/17/2020   Procedure: ESOPHAGOGASTRODUODENOSCOPY (EGD) WITH PROPOFOL;  Surgeon: Irving Copas.,  MD;  Location: [facility];  Service: Gastroenterology;  Laterality: N/A;  . ESOPHAGOGASTRODUODENOSCOPY (EGD) WITH PROPOFOL N/A 05/29/2020   Procedure: ESOPHAGOGASTRODUODENOSCOPY (EGD) WITH PROPOFOL;  Surgeon: Rush Landmark Telford Nab., MD;  Location: [facility];  Service: Gastroenterology;  Laterality: N/A;  . IR CHOLANGIOGRAM EXISTING TUBE  05/18/2020  . IR CHOLANGIOGRAM EXISTING TUBE  05/29/2020  . IR INT EXT BILIARY  DRAIN WITH CHOLANGIOGRAM  05/29/2020  . IR PARACENTESIS  05/18/2020  . IR PARACENTESIS  05/29/2020  . IR RADIOLOGIST EVAL & MGMT  03/01/2020  . IR RADIOLOGIST EVAL & MGMT  04/10/2020  . PANCREATIC STENT PLACEMENT  01/21/2020   Procedure: PANCREATIC STENT PLACEMENT;  Surgeon: Ladene Artist, MD;  Location: [facility];  Service: Endoscopy;;  . PANCREATIC STENT PLACEMENT  01/23/2020   Procedure: PANCREATIC STENT PLACEMENT;  Surgeon: Irving Copas., MD;  Location: [facility];  Service: Gastroenterology;;  . [name]  01/23/2020   Procedure: [name];  Surgeon: Irving Copas., MD;  Location: [facility];  Service: Gastroenterology;;  . Lavell Islam REMOVAL  03/21/2020   Procedure: STENT REMOVAL;  Surgeon: Irving Copas., MD;  Location: [facility];  Service: Gastroenterology;;  . Lavell Islam REMOVAL  04/25/2020   Procedure: STENT REMOVAL;  Surgeon: Irving Copas., MD;  Location: [facility];  Service: Gastroenterology;;     Scheduled Meds: . acetaminophen  650 mg Per Tube Q6H  . acidophilus  2 capsule Oral Daily  . Chlorhexidine Gluconate Cloth  6 each Topical Daily  . enoxaparin (LOVENOX) injection  40 mg Subcutaneous Q24H  . influenza vac split quadrivalent PF  0.5 mL Intramuscular Tomorrow-1000  . insulin aspart  0-20 Units Subcutaneous Q4H  . mouth rinse  15 mL Mouth Rinse BID  . oxyCODONE  20 mg Oral Q12H  . pantoprazole (PROTONIX) IV  40 mg Intravenous Q12H  . pravastatin  20 mg Per Tube q1800  . sertraline  100 mg Per Tube Daily  . sodium chloride flush  10-40 mL Intracatheter Q12H  . sodium chloride flush  5 mL Intracatheter Q8H  . zolpidem  5 mg Per Tube QHS   Continuous Infusions: . sodium chloride Stopped (05/07/20 0923)  . lactated ringers Stopped (06/01/20 1257)  . meropenem (MERREM) IV Stopped (06/02/20 0618)  . norepinephrine (LEVOPHED) Adult infusion Stopped (06/02/20 0308)  . TPN ADULT (ION) 100 mL/hr at 06/02/20 0800  . vancomycin  Stopped (06/02/20 0409)   PRN Meds:.bisacodyl, fentaNYL (SUBLIMAZE) injection, ipratropium-albuterol, lidocaine, oxyCODONE, polyvinyl alcohol, promethazine, sodium chloride flush, sodium chloride flush  Allergies  Allergen Reactions  . Toradol [Ketorolac Tromethamine] Other (See Comments)    delirium   Social History   Socioeconomic History  . Marital status: Married    Spouse name: Not on file  . Number of children: Not on file  . Years of education: Not on file  . Highest education level: Not on file  Occupational History  . Not on file  Tobacco Use  . Smoking status: Never Smoker  . Smokeless tobacco: Never Used  Vaping Use  . Vaping Use: Never used  Substance and Sexual Activity  . Alcohol use: Not Currently    Comment: occasional  . Drug use: No  . Sexual activity: Not Currently    Partners: Female  Other Topics Concern  . Not on file  Social History Narrative  . Not on file   Social Determinants of Health   Financial Resource Strain:   . Difficulty of Paying Living Expenses: Not on file  Food Insecurity:   .  Worried About Charity fundraiser in the Last Year: Not on file  . Ran Out of Food in the Last Year: Not on file  Transportation Needs:   . Lack of Transportation (Medical): Not on file  . Lack of Transportation (Non-Medical): Not on file  Physical Activity:   . Days of Exercise per Week: Not on file  . Minutes of Exercise per Session: Not on file  Stress:   . Feeling of Stress : Not on file  Social Connections:   . Frequency of Communication with Friends and Family: Not on file  . Frequency of Social Gatherings with Friends and Family: Not on file  . Attends Religious Services: Not on file  . Active Member of Clubs or Organizations: Not on file  . Attends Archivist Meetings: Not on file  . Marital Status: Not on file  Intimate Partner Violence:   . Fear of Current or Ex-Partner: Not on file  . Emotionally Abused: Not on file  .  Physically Abused: Not on file  . Sexually Abused: Not on file   Vitals  BP 125/84   Pulse 86   Temp (!) 97.1 F (36.2 C) (Axillary)   Resp 13   Ht 5\' 9"  (1.753 m)   Wt 83.1 kg   SpO2 100%   BMI 27.05 kg/m   Examination  Gen:  not in acute distress, ABLE TO TELL ME THE NAME OF THE PRSIDENT, HE IS AT [facility] AND THE YEAR HEENT: /AT, PERLA, no scleral icterus, no pale conjunctivae, hearing normal, mucosa moist Neck: Supple, no lymphadenopathy Cardio: RRR, +S1 and S2, No murmur Resp: CTAB; no wheezes, rhonchi, or rales GI: Soft, nontender, BS+, 2 BILIARY DRAINS IN THE RT UQ DRAINING BILE Musc: No joint swelling or tenderness Extremities: 2+ PITTING PEDAL EDEMA  Skin: No rashes, lesions, or ecchymoses Neuro: grossly nonfocal Psych: Calm, cooperative  LINES/TUBES: PICC LINE PLACED ON 9/11  METAL IMPLANT/HARDWARE: PANCREATIC STENTS    Pertinent Lab seen by me: CBC Latest Ref Rng & Units 06/01/2020 05/31/2020 05/31/2020  WBC 4.0 - 10.5 K/uL 42.2(H) 15.6(H) 26.2(H)  Hemoglobin 13.0 - 17.0 g/dL 7.8(L) 8.6(L) 8.2(L)  Hematocrit 39 - 52 % 27.9(L) 30.8(L) 28.6(L)  Platelets 150 - 400 K/uL 296 290 311   CMP Latest Ref Rng & Units 06/02/2020 06/01/2020 06/01/2020  Glucose 70 - 99 mg/dL 228(H) - -  BUN 8 - 23 mg/dL 48(H) - -  Creatinine 0.61 - 1.24 mg/dL 0.90 - -  Sodium 135 - 145 mmol/L 146(H) - -  Potassium 3.5 - 5.1 mmol/L 3.7 4.6 -  Chloride 98 - 111 mmol/L 116(H) - -  CO2 22 - 32 mmol/L 21(L) - -  Calcium 8.9 - 10.3 mg/dL 8.4(L) - -  Total Protein 6.5 - 8.1 g/dL 5.6(L) - 6.0(L)  Total Bilirubin 0.3 - 1.2 mg/dL 1.1 - 1.2  Alkaline Phos 38 - 126 U/L 246(H) - 318(H)  AST 15 - 41 U/L 27 - 48(H)  ALT 0 - 44 U/L 30 - 14   Microbiology  Results for orders placed or performed during the hospital encounter of 04/26/20  Blood culture (routine x 2)     Status: None   Collection Time: 04/26/20  5:16 PM   Specimen: BLOOD  Result Value Ref Range Status   Specimen Description BLOOD LEFT  ANTECUBITAL  Final   Special Requests   Final    BOTTLES DRAWN AEROBIC AND ANAEROBIC Blood Culture adequate volume   Culture  Final    NO GROWTH 5 DAYS Performed at [facility], [place] [address], [place], [place] [address]    Report Status 05/01/2020 FINAL  Final  Blood culture (routine x 2)     Status: None   Collection Time: 04/26/20  5:23 PM   Specimen: BLOOD  Result Value Ref Range Status   Specimen Description BLOOD RIGHT ANTECUBITAL  Final   Special Requests   Final    BOTTLES DRAWN AEROBIC AND ANAEROBIC Blood Culture results may not be optimal due to an inadequate volume of blood received in culture bottles   Culture   Final    NO GROWTH 5 DAYS Performed at [facility], [place] [address], [place], [place] [address]    Report Status 05/01/2020 FINAL  Final  SARS Coronavirus 2 by RT PCR (hospital order, performed in [facility] hospital lab) Nasopharyngeal Nasopharyngeal Swab     Status: None   Collection Time: 04/26/20  9:50 PM   Specimen: Nasopharyngeal Swab  Result Value Ref Range Status   SARS Coronavirus 2 NEGATIVE NEGATIVE Final    Comment: (NOTE) SARS-CoV-2 target nucleic acids are NOT DETECTED.  The SARS-CoV-2 RNA is generally detectable in upper and lower respiratory specimens during the acute phase of infection. The lowest concentration of SARS-CoV-2 viral copies this assay can detect is 250 copies / mL. A negative result does not preclude SARS-CoV-2 infection and should not be used as the sole basis for treatment or other patient management decisions.  A negative result may occur with improper specimen collection / handling, submission of specimen other than nasopharyngeal swab, presence of viral mutation(s) within the areas targeted by this assay, and inadequate number of viral copies (<250 copies / mL). A negative result must be combined with clinical observations, patient history, and epidemiological information.  Fact Sheet for Patients:     StrictlyIdeas.no  Fact Sheet for Healthcare Providers: BankingDealers.co.za  This test is not yet approved or  cleared by the [place] FDA and has been authorized for detection and/or diagnosis of SARS-CoV-2 by FDA under an Emergency Use Authorization (EUA).  This EUA will remain in effect (meaning this test can be used) for the duration of the COVID-19 declaration under Section 564(b)(1) of the Act, 21 U.S.C. section 360bbb-3(b)(1), unless the authorization is terminated or revoked sooner.  Performed at [facility], [place] [address] [place], [place] [address]   MRSA PCR Screening     Status: None   Collection Time: 04/30/20 12:10 AM   Specimen: Nasal Mucosa; Nasopharyngeal  Result Value Ref Range Status   MRSA by PCR NEGATIVE NEGATIVE Final    Comment:        The GeneXpert MRSA Assay (FDA approved for NASAL specimens only), is one component of a comprehensive MRSA colonization surveillance program. It is not intended to diagnose MRSA infection nor to guide or monitor treatment for MRSA infections. Performed at [facility], [place] [address] [place], [place] [address]   Culture, body fluid-bottle     Status: None   Collection Time: 05/18/20  5:13 PM   Specimen: Fluid  Result Value Ref Range Status   Specimen Description FLUID PERITONEAL  Final   Special Requests BOTTLES DRAWN AEROBIC AND ANAEROBIC  Final   Culture   Final    NO GROWTH 5 DAYS Performed at [facility], [place] [address] [place],  [address]    Report Status 05/23/2020 FINAL  Final  Gram stain  Status: None   Collection Time: 05/18/20  5:13 PM   Specimen: Fluid  Result Value Ref Range Status   Specimen Description FLUID PERITONEAL  Final   Special Requests NONE  Final   Gram Stain   Final    WBC PRESENT, PREDOMINANTLY MONONUCLEAR NO ORGANISMS SEEN CYTOSPIN SMEAR Performed at [facility], [place] [address],  [place], [place] [address]    Report Status 05/19/2020 FINAL  Final  Culture, blood (Routine X 2) w Reflex to ID Panel     Status: None (Preliminary result)   Collection Time: 05/31/20  6:37 PM   Specimen: BLOOD  Result Value Ref Range Status   Specimen Description BLOOD LEFT ANTECUBITAL  Final   Special Requests   Final    BOTTLES DRAWN AEROBIC AND ANAEROBIC Blood Culture results may not be optimal due to an inadequate volume of blood received in culture bottles   Culture  Setup Time   Final    GRAM POSITIVE COCCI GRAM NEGATIVE RODS AEROBIC BOTTLE ONLY Organism ID to follow CRITICAL RESULT CALLED TO, READ BACK BY AND VERIFIED WITH: J [facility] 06/02/20 0209 JDW    Culture   Final    CULTURE REINCUBATED FOR BETTER GROWTH Performed at [facility], [place] [address], [place], [place] [address]    Report Status PENDING  Incomplete  Blood Culture ID Panel (Reflexed)     Status: Abnormal   Collection Time: 05/31/20  6:37 PM  Result Value Ref Range Status   Enterococcus faecalis DETECTED (A) NOT DETECTED Final    Comment: CRITICAL RESULT CALLED TO, READ BACK BY AND VERIFIED WITH: J [facility] 06/02/20 0209 JDW    Enterococcus Faecium NOT DETECTED NOT DETECTED Final   Listeria monocytogenes NOT DETECTED NOT DETECTED Final   Staphylococcus species NOT DETECTED NOT DETECTED Final   Staphylococcus aureus (BCID) NOT DETECTED NOT DETECTED Final   Staphylococcus epidermidis NOT DETECTED NOT DETECTED Final   Staphylococcus lugdunensis NOT DETECTED NOT DETECTED Final   Streptococcus species NOT DETECTED NOT DETECTED Final   Streptococcus agalactiae NOT DETECTED NOT DETECTED Final   Streptococcus pneumoniae NOT DETECTED NOT DETECTED Final   Streptococcus pyogenes NOT DETECTED NOT DETECTED Final   A.calcoaceticus-baumannii NOT DETECTED NOT DETECTED Final   Bacteroides fragilis NOT DETECTED NOT DETECTED Final   Enterobacterales DETECTED (A) NOT DETECTED Final    Comment: Enterobacterales  represent a large order of gram negative bacteria, not a single organism. CRITICAL RESULT CALLED TO, READ BACK BY AND VERIFIED WITH: J [facility]FORD [facility] 06/02/20 0209 JDW    Enterobacter cloacae complex NOT DETECTED NOT DETECTED Final   Escherichia coli NOT DETECTED NOT DETECTED Final   Klebsiella aerogenes NOT DETECTED NOT DETECTED Final   Klebsiella oxytoca NOT DETECTED NOT DETECTED Final   Klebsiella pneumoniae DETECTED (A) NOT DETECTED Final    Comment: CRITICAL RESULT CALLED TO, READ BACK BY AND VERIFIED WITH: J LEDFORD [facility] 06/02/20 0209 JDW    Proteus species NOT DETECTED NOT DETECTED Final   Salmonella species NOT DETECTED NOT DETECTED Final   Serratia marcescens NOT DETECTED NOT DETECTED Final   Haemophilus influenzae NOT DETECTED NOT DETECTED Final   Neisseria meningitidis NOT DETECTED NOT DETECTED Final   Pseudomonas aeruginosa NOT DETECTED NOT DETECTED Final   Stenotrophomonas maltophilia NOT DETECTED NOT DETECTED Final   Candida albicans NOT DETECTED NOT DETECTED Final   Candida auris NOT DETECTED NOT DETECTED Final   Candida glabrata NOT DETECTED NOT DETECTED Final   Candida  krusei NOT DETECTED NOT DETECTED Final   Candida parapsilosis NOT DETECTED NOT DETECTED Final   Candida tropicalis NOT DETECTED NOT DETECTED Final   Cryptococcus neoformans/gattii NOT DETECTED NOT DETECTED Final   CTX-M ESBL NOT DETECTED NOT DETECTED Final   Carbapenem resistance IMP NOT DETECTED NOT DETECTED Final   Carbapenem resistance KPC NOT DETECTED NOT DETECTED Final   Carbapenem resistance NDM NOT DETECTED NOT DETECTED Final   Carbapenem resist OXA 48 LIKE NOT DETECTED NOT DETECTED Final   Vancomycin resistance NOT DETECTED NOT DETECTED Final   Carbapenem resistance VIM NOT DETECTED NOT DETECTED Final    Comment: Performed at [facility], [place] [address] [place], [place] [address]      Pertinent Imagings/Other Imagings Plain films and CT images have been personally visualized and  interpreted; radiology reports have been reviewed. Decision making incorporated into the Impression / Recommendations.

## 2020-06-03 ENCOUNTER — Inpatient Hospital Stay (HOSPITAL_COMMUNITY): Payer: 59

## 2020-06-03 DIAGNOSIS — Z515 Encounter for palliative care: Secondary | ICD-10-CM

## 2020-06-03 LAB — BASIC METABOLIC PANEL
Anion gap: 6 (ref 5–15)
BUN: 40 mg/dL — ABNORMAL HIGH (ref 8–23)
CO2: 21 mmol/L — ABNORMAL LOW (ref 22–32)
Calcium: 8.3 mg/dL — ABNORMAL LOW (ref 8.9–10.3)
Chloride: 116 mmol/L — ABNORMAL HIGH (ref 98–111)
Creatinine, Ser: 0.7 mg/dL (ref 0.61–1.24)
GFR calc Af Amer: >60 mL/min (ref >60)
GFR calc non Af Amer: >60 mL/min (ref >60)
Glucose, Bld: 159 mg/dL — ABNORMAL HIGH (ref 70–99)
Potassium: 3.3 mmol/L — ABNORMAL LOW (ref 3.5–5.1)
Sodium: 143 mmol/L (ref 135–145)

## 2020-06-03 LAB — PHOSPHORUS: Phosphorus: 2.7 mg/dL (ref 2.5–4.6)

## 2020-06-03 LAB — GLUCOSE, CAPILLARY
Glucose-Capillary: 105 mg/dL — ABNORMAL HIGH (ref 70–99)
Glucose-Capillary: 122 mg/dL — ABNORMAL HIGH (ref 70–99)
Glucose-Capillary: 127 mg/dL — ABNORMAL HIGH (ref 70–99)
Glucose-Capillary: 128 mg/dL — ABNORMAL HIGH (ref 70–99)
Glucose-Capillary: 149 mg/dL — ABNORMAL HIGH (ref 70–99)
Glucose-Capillary: 169 mg/dL — ABNORMAL HIGH (ref 70–99)

## 2020-06-03 LAB — MAGNESIUM: Magnesium: 2 mg/dL (ref 1.7–2.4)

## 2020-06-03 LAB — PROCALCITONIN: Procalcitonin: 8.94 ng/mL

## 2020-06-03 MED ORDER — DEXTROSE 5 % IV SOLN: INTRAVENOUS | Status: AC

## 2020-06-03 MED ORDER — POTASSIUM CHLORIDE 10 MEQ/50ML IV SOLN
10.0000 meq | INTRAVENOUS | Status: AC
  Administered 2020-06-03 (×4): 10 meq via INTRAVENOUS
  Filled 2020-06-03 (×4): qty 50

## 2020-06-03 MED ORDER — TRAVASOL 10 % IV SOLN
INTRAVENOUS | Status: AC
  Filled 2020-06-03: qty 1320

## 2020-06-03 MED ORDER — BISACODYL 10 MG RE SUPP: 10.0000 mg | Freq: Every day | RECTAL | Status: DC | PRN

## 2020-06-03 NOTE — Progress Notes (Signed)
Subjective: Understandably depressed   Objective: Vital signs in last 24 hours: Temp:  [97 F (36.1 C)-98.8 F (37.1 C)] 98.4 F (36.9 C) (09/26 0400) Pulse Rate:  [72-96] 89 (09/26 0700) Resp:  [12-28] 17 (09/26 0700) BP: (91-138)/(63-93) 128/79 (09/26 0700) SpO2:  [92 %-100 %] 100 % (09/26 0700) Weight:  [86.3 kg] 86.3 kg (09/26 0500) Last BM Date: 05/25/20  Intake/Output from previous day: 09/25 0701 - 09/26 0700 In: 3777 [I.V.:2907.1; NG/GT:170; IV Piggyback:700] Out: 2910 [Urine:850; Emesis/NG output:1700; Drains:360] Intake/Output this shift: No intake/output data recorded.  PE: General: alert Neuro: confused HEENT: NG tube in place, draining gastric contents Resp: mildly increased work of breathing, on [place]. Abdomen: distended but soft, RUQ PBD with bilious fluid in bag. Percutaneous drain with scant serous fluid.  Lab Results:  Recent Labs    06/01/20 0809 06/02/20 1026  WBC 42.2* 18.5*  HGB 7.8* 7.7*  HCT 27.9* 26.8*  PLT 296 250   BMET Recent Labs    06/02/20 0550 06/03/20 0528  NA 146* 143  K 3.7 3.3*  CL 116* 116*  CO2 21* 21*  GLUCOSE 228* 159*  BUN 48* 40*  CREATININE 0.90 0.70  CALCIUM 8.4* 8.3*   PT/INR Recent Labs    05/31/20 1415  LABPROT 17.7*  INR 1.5*   CMP     Component Value Date/Time   NA 143 06/03/2020 0528   K 3.3 (L) 06/03/2020 0528   CL 116 (H) 06/03/2020 0528   CO2 21 (L) 06/03/2020 0528   GLUCOSE 159 (H) 06/03/2020 0528   BUN 40 (H) 06/03/2020 0528   CREATININE 0.70 06/03/2020 0528   CALCIUM 8.3 (L) 06/03/2020 0528   PROT 5.6 (L) 06/02/2020 0550   ALBUMIN 1.5 (L) 06/02/2020 0550   AST 27 06/02/2020 0550   ALT 30 06/02/2020 0550   ALKPHOS 246 (H) 06/02/2020 0550   BILITOT 1.1 06/02/2020 0550   GFRNONAA >60 06/03/2020 0528   GFRAA >60 06/03/2020 0528   Lipase     Component Value Date/Time   LIPASE 27 05/16/2020 1905       Studies/Results: DG Abd Portable 1V  Result Date: 06/02/2020 CLINICAL  DATA:  Initial evaluation for NG tube placement. EXAM: PORTABLE ABDOMEN - 1 VIEW COMPARISON:  Prior radiograph from 06/01/2020. FINDINGS: Enteric tube in place with tip seen overlying the gastric body, side hole beyond the GE junction. Visualized bowel gas pattern within normal limits. Pigtail drainage catheter overlies the right upper quadrant. Additional surgical drain and biliary stent remains in place. Persistent dense opacity at the left lung base, which could reflect a combination of effusion with atelectasis and/or consolidation. IMPRESSION: 1. Enteric tube in place with tip overlying the gastric body, side hole beyond the GE junction. 2. Persistent dense left basilar opacity, which could reflect a combination of effusion with atelectasis and/or consolidation. Electronically Signed   By: Jeannine Boga M.D.   On: 06/02/2020 03:50   DG Abd Portable 1V  Result Date: 06/01/2020 CLINICAL DATA:  Nasogastric tube placement. EXAM: PORTABLE ABDOMEN - 1 VIEW COMPARISON:  05/27/2020 FINDINGS: Nasogastric tube has been advanced, tip overlying the level of the distal stomach. Surgical drains and biliary stent in place. Bowel gas pattern is nonobstructive. There is residual contrast in the colon. There is opacity at the LEFT lung base consistent with consolidation and pleural effusion. IMPRESSION: Nasogastric tube tip to the level of the distal stomach. Electronically Signed   By: Nolon Nations M.D.   On: 06/01/2020 14:24  Anti-infectives: Anti-infectives (From admission, onward)   Start     Dose/Rate Route Frequency Ordered Stop   06/02/20 0300  meropenem (MERREM) 2 g in sodium chloride 0.9 % 100 mL IVPB        2 g 200 mL/hr over 30 Minutes Intravenous Every 8 hours 06/02/20 0248     06/02/20 0300  vancomycin (VANCOCIN) IVPB 1000 mg/200 mL premix        1,000 mg 200 mL/hr over 60 Minutes Intravenous Every 12 hours 06/02/20 0248     06/01/20 1000  piperacillin-tazobactam (ZOSYN) IVPB 3.375 g   Status:  Discontinued        3.375 g 12.5 mL/hr over 240 Minutes Intravenous Every 8 hours 06/01/20 0952 06/02/20 0248   05/29/20 0800  piperacillin-tazobactam (ZOSYN) IVPB 3.375 g        3.375 g 12.5 mL/hr over 240 Minutes Intravenous To Radiology 05/28/20 1328 05/29/20 1943   05/04/20 1230  meropenem (MERREM) 1 g in sodium chloride 0.9 % 100 mL IVPB  Status:  Discontinued        1 g 200 mL/hr over 30 Minutes Intravenous Every 8 hours 05/04/20 1156 05/19/20 0826   05/01/20 1400  piperacillin-tazobactam (ZOSYN) IVPB 3.375 g        3.375 g 12.5 mL/hr over 240 Minutes Intravenous Every 8 hours 05/01/20 1013 05/03/20 0907   04/29/20 2000  vancomycin (VANCOCIN) IVPB 1000 mg/200 mL premix  Status:  Discontinued        1,000 mg 200 mL/hr over 60 Minutes Intravenous Every 12 hours 04/29/20 0653 04/30/20 0831   04/29/20 0745  vancomycin (VANCOREADY) IVPB 1500 mg/300 mL        1,500 mg 150 mL/hr over 120 Minutes Intravenous  Once 04/29/20 0653 04/29/20 1129   04/26/20 2300  piperacillin-tazobactam (ZOSYN) IVPB 3.375 g        3.375 g 12.5 mL/hr over 240 Minutes Intravenous Every 8 hours 04/26/20 1716 05/01/20 0559   04/26/20 1700  piperacillin-tazobactam (ZOSYN) IVPB 3.375 g        3.375 g 100 mL/hr over 30 Minutes Intravenous  Once 04/26/20 1655 04/26/20 1844       Assessment/Plan 64 yo male s/p subtotal cholecystectomy, complicated by persistent bile leak, 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 S/p Perc TBD. - Keep biliary drain externalized until patient clinically improves. Trend daily LFTs. Suspect there is a source of sepsis in addition to cholangitis, as PBD was only capped for a few hours and the WBC continues to rise with near normal bilirubin.   - Agree with antibiotics for sepsis. No evidence of infected peripancreatic necrosis on CT scan from yesterday. - Remain NPO, keep NG tube in place. When patient is having regular bowel  function, recommend attempting placement of an [place] feeding tube to trial enteral feeds. Please hold miralax and minimize PO meds given gastric outlet obstruction. Continue TPN for now. - Optimize nutrition - continue TPN - Surgery will continue to follow   LOS: 37 days   [facility], [employer] for weekday/non holidays Check [email or web address] for coverage night/weekend/holidays  Do not use SecureChat as it is not reliable for timely patient care.      06/03/20 8:48 AM

## 2020-06-03 NOTE — Progress Notes (Signed)
NAME:  Adam Fowler, MRN:  [id number], DOB:  October 03, 1955, LOS: 33 ADMISSION DATE:  04/26/2020, CONSULTATION DATE:  9/23 REFERRING MD:  Wyline Copas, CHIEF COMPLAINT:  Hypotension  Brief History   64 yo M s/p subtotal cholecystectomy c/b persistent bile lek and biliary fistula, post ERCP necrotizing pancreatitis-- developed hypotension 9/23 and CCM consulted   Past Medical History   Kidney stones HTN Insomnia S/p cholecystectomy and abscess  Pancreatitis   Significant Hospital Events   8/20 admit 8/21-24 admitted 9/21 perc bilary drainage  9/23 drain capped. Acute decompensation in afternoon. Fluid resuscitation, starting on pressors and transferring to ICU   Consults:  PCCM GI CCS IR  Procedures:  ETT 8/21-24  Significant Diagnostic Tests:  9/18 ct a/p w/con> moderate peripancreatic fluid extending into gastrohepatic ligament. Possible pseudocyst. Pigtail drainage catheter tip in region of gallbladder fossa. CBD stent. Persistent bibasilar opacities. L pleural effusion. Moderate ascites. Colonic diverticulosis  9/23 KUB>> cholecystostomy tube & internal/external biliary drain noted with a CBD stent in place, gastric tube within the stomach. Scattered large & small bowel gas pattern noted  9/23 ct a/p w con>>> Again noted are findings of acute pancreatitis with relatively stable peripancreatic fluid collections.  Findings consistent with a gastric ileus. The esophagus is dilated and fluid-filled to at least the mid thorax. This patient would likely benefit from NG tube decompression. Worsening airspace opacities at the lung bases concerning for developing infiltrates or aspiration. Moderate-sized left-sided pleural effusion with at least partial collapse of the left lower lobe. There is a trace to small right-sided pleural effusion with mild adjacent atelectasis.  Stable small volume abdominal ascites.  Well-positioned internal/external biliary drain. Unchanged cholecystostomy  tube.  9/25: CT abdomen IMPRESSION: 1. Enteric tube in place with tip overlying the gastric body, side hole beyond the GE junction. 2. Persistent dense left basilar opacity, which could reflect a combination of effusion with atelectasis and/or consolidation.  Micro Data:  8/19 COVID-19 >> negative 9/10 peritoneal fluid Cx >> negative 9/23 BCx>>   Antimicrobials:  Meropenem 9/24>> Vancomycin 9/24>> Zosyn 9/21, 9/24 Interim history/subjective:  9/26: Awake, feels depressed. 9/25: Awake and interactive, Feels a little bit better, slept a little bit better last night 9/24: Patient sitting up in bed, complaining of intermittent lower back pain, but states he is otherwise "feeling fine".  When asked about yesterday's events, patient said he needed a "minute to think about it" and then began talking about a clinic lock down with hostages.  He reports not sleeping well. While bedside titrated O2 from NRB > HFNC 5 L Spo2 > 95%  Overnight: - NE titrated down to 10 mcg - NGT output per bedside RN: 2 L post insertion / 300 mL night shift & 100 ML so far this AM  Objective   Blood pressure (!) 116/59, pulse 87, temperature 98.4 F (36.9 C), temperature source Oral, resp. rate 17, height 5\' 9"  (1.753 m), weight 86.3 kg, SpO2 100 %.        Intake/Output Summary (Last 24 hours) at 06/03/2020 0955 Last data filed at 06/03/2020 1194 Gross per 24 hour  Intake 3736.88 ml  Output 2630 ml  Net 1106.88 ml   Filed Weights   06/01/20 0500 06/02/20 0500 06/03/20 0500  Weight: 83.9 kg 83.1 kg 86.3 kg    Examination: General: Not in obvious distress HEENT: Moist oral mucosa, nasogastric tube in place Neuro: Alert and oriented, occasional confusion CV: S1-S2 appreciated Pulm: Clear breath sounds, decreased at the bases GI: distention, taut,  hypoactive bs x 4, drains x 2 in place Skin: Icteric Extremities: warm/dry, +2 BLE (feet) edema noted  Afebrile, leukocytosis improved from 42 down to  18.5 Labs/Imaging personally reveiwed BUN/creatinine 48/0.9-BUN improved to 40 creatinine to 0.7  CXR 9/24-reviewed by myself, hypoventilated lung fields, effusion on the left  Resolved Hospital Problem list     Assessment & Plan:  Acute post ERCP pancreatitis Bile leak following cholecystectomy -Continue TPN -Scheduled pain medication -GI, IR, surgery following -Biliary drain to gravity -NG tube in place  Septic shock -Lactate did improve -Elevated procalcitonin -Off pressors -On vancomycin and meropenem -We will continue with present  Acute hypoxic respiratory failure -Continue supplemental oxygen -Continue pulmonary hygiene -Hypoventilation secondary to intra-abdominal process  Esophagitis with recent bleeding -N.p.o. -Continue TPN -Twice daily Protonix  ICU delirium -Minimize medications that may affect mental status -Reorientate as needed  Hypertension -Home meds on hold  Continue to trend electrolytes  Enteric tube feeding once bowel functions improved  Best practice:   Diet: TPN  Pain/Anxiety/Delirium protocol (if indicated): On oxycodone VAP protocol (if indicated): Not applicable DVT prophylaxis: lovenox GI prophylaxis: BID protonix Glucose control: SSI target range 140-180 Mobility: bedrest, mobilize as tolerated Code Status: Full  Family Communication: wife updated via phone- 9/23 Disposition: ICU   Labs   CBC: Recent Labs  Lab 05/28/20 0350 05/28/20 0350 05/29/20 0841 05/31/20 0458 05/31/20 1327 06/01/20 0809 06/02/20 1026  WBC 23.3*   < > 23.6* 26.2* 15.6* 42.2* 18.5*  NEUTROABS 19.0*  --   --   --   --  39.2* 16.0*  HGB 8.8*   < > 8.6* 8.2* 8.6* 7.8* 7.7*  HCT 28.6*   < > 29.5* 28.6* 30.8* 27.9* 26.8*  MCV 90.2   < > 91.9 92.6 96.3 95.9 93.7  PLT 321   < > 304 311 290 296 250   < > = values in this interval not displayed.    Basic Metabolic Panel: Recent Labs  Lab 05/28/20 0350 05/29/20 0404 05/31/20 0458 05/31/20 0458  05/31/20 1327 06/01/20 0516 06/01/20 1143 06/02/20 0550 06/03/20 0528  NA 142   < > 143  --  145 145  --  146* 143  K 3.8   < > 4.7   < > 4.2 5.3* 4.6 3.7 3.3*  CL 103   < > 111  --  115* 114*  --  116* 116*  CO2 28   < > 22  --  18* 21*  --  21* 21*  GLUCOSE 297*   < > 206*  --  69* 209*  --  228* 159*  BUN 41*   < > 47*  --  52* 51*  --  48* 40*  CREATININE 0.82   < > 1.05  --  1.39* 1.23  --  0.90 0.70  CALCIUM 8.2*   < > 8.6*  --  8.6* 8.2*  --  8.4* 8.3*  MG 2.1  --  2.4  --   --   --   --  2.3 2.0  PHOS 3.2  --  3.4  --   --   --   --  2.7 2.7   < > = values in this interval not displayed.   GFR: Estimated Creatinine Clearance: 101.5 mL/min (by C-G formula based on SCr of 0.7 mg/dL). Recent Labs  Lab 05/31/20 0458 05/31/20 1327 05/31/20 1834 06/01/20 0809 06/01/20 0856 06/01/20 1157 06/02/20 0550 06/02/20 1026 06/03/20 0528  PROCALCITON  --   --   --  30.80  --   --  16.76  --  8.94  WBC 26.2* 15.6*  --  42.2*  --   --   --  18.5*  --   LATICACIDVEN  --   --  5.3* 2.9* 3.5* 2.8*  --   --   --     Liver Function Tests: Recent Labs  Lab 05/28/20 0350 05/31/20 0458 05/31/20 1327 06/01/20 0856 06/02/20 0550  AST 28 25 32 48* 27  ALT 29 36 33 39 30  ALKPHOS 303* 313* 472* 318* 246*  BILITOT 0.9 0.7 2.4* 1.2 1.1  PROT 5.7* 5.7* 5.5* 6.0* 5.6*  ALBUMIN 1.4* 1.3* 1.7* 1.7* 1.5*   No results for input(s): LIPASE, AMYLASE in the last 168 hours. No results for input(s): AMMONIA in the last 168 hours.  ABG    Component Value Date/Time   PHART 7.295 (L) 05/31/2020 1625   PCO2ART 38.6 05/31/2020 1625   PO2ART 62.0 (L) 05/31/2020 1625   HCO3 18.0 (L) 05/31/2020 1625   TCO2 26 04/30/2020 0241   ACIDBASEDEF 7.1 (H) 05/31/2020 1625   O2SAT 87.5 05/31/2020 1625     Coagulation Profile: Recent Labs  Lab 05/29/20 0404 05/31/20 1415  INR 1.4* 1.5*    Cardiac Enzymes: No results for input(s): CKTOTAL, CKMB, CKMBINDEX, TROPONINI in the last 168  hours.  HbA1C: Hgb A1c MFr Bld  Date/Time Value Ref Range Status  04/29/2020 02:02 AM 5.6 4.8 - 5.6 % Final    Comment:    (NOTE) Pre diabetes:          5.7%-6.4%  Diabetes:              >6.4%  Glycemic control for   <7.0% adults with diabetes   02/13/2020 04:55 PM 6.0 (H) 4.8 - 5.6 % Final    Comment:    (NOTE) Pre diabetes:          5.7%-6.4% Diabetes:              >6.4% Glycemic control for   <7.0% adults with diabetes     CBG: Recent Labs  Lab 06/02/20 1610 06/02/20 1940 06/02/20 2317 06/03/20 0350 06/03/20 0815  GLUCAP 182* 159* 145* 169* 128*   The patient is critically ill with multiple organ systems failure and requires high complexity decision making for assessment and support, frequent evaluation and titration of therapies, application of advanced monitoring technologies and extensive interpretation of multiple databases. Critical Care Time devoted to patient care services described in this note independent of APP/resident time (if applicable)  is 32 minutes.   Sherrilyn Rist MD [facility] Personal pager: [phone] If unanswered, please page CCM On-call: [phone]

## 2020-06-03 NOTE — Progress Notes (Addendum)
PHARMACY - TOTAL PARENTERAL NUTRITION CONSULT NOTE  Indication:  Severe pancreatitis, biliary fistula, GOO  Patient Measurements: Height: 5' 9"  (175.3 cm) Weight: 86.3 kg (190 lb 4.1 oz) IBW/kg (Calculated) : 70.7 TPN AdjBW (KG): 86.1 Body mass index is 28.1 kg/m.  Assessment:  16 YOM admitted 04/26/20, post-ERCP 8/18 and biliary stent exchange for persistent postoperative bile leak related to recent cholecystectomy with abscess s/p percutaneous drainage. Patient developed acute pancreatitis, has had a prolonged hospitalization complicated by VDRF (extubated 8/24), AKI, encephalopathy, and afib. TPN initiated 9/11 for acute post-ERCP pancreatitis/ileus and persistent biliary leak. Significant gastric fluid retention from functional outlet obstruction. Pt still with very poor PO intake.  9/23 - acutely decompensated requiring pressors and non-rebreather. Patient now in septic shock.    Glucose / Insulin: A1c 5.6% - CBGs improving on continuous TPN.  Required 30 units rSSI + 35 units in TPN.  Electrolytes: K 3.3 from removal on 9/24 (goal >/= 4 for ileus), high CL, low CO2 (max acetate in TPN), CoCa down to 10.3 (none in TPN), Phos low normal, Mag down to 2 Renal: SCr < 1, BUN down 40 LFTs / TGs: LFTs WNL except alk phos down to 246, tbili WNL, TG mildly elevated 162 Prealbumin / albumin: Prealbumin 8.2 (up). Albumin 1.5 Intake / Output; MIVF: UOP 0.4 mL/kg/hr, NG down to 876m, drain up to 503m net -4.8L.  LBM 9/20.  Off LR [id number] GI Imaging:  9/5: CT - severe pancreatitis 9/7: HIDA - persistent bile leak with accumulations/paracolic gutter 9/9: CT - pancreatitis (peripancreatic fat stranding and peripancreatic fluid, percutaneous drain at the gallbladder fossa, unchanged biliary stent with no intrahepatic biliary duct dilatation, bilateral pleural effusions 9/15: KUB - diffuse colonic stool, no bowel obstruction/free air 9/18: CT - mild colonic diverticulosis, pseudocyst involvement and  less likely abscess 9/23: KUB - no evidence of bowel obstruction/ileus 9/23: CT - Gastric ileus, dilated/fluid-filled esophagus, small volume abd ascites Surgeries / Procedures: 8/18: ERCP/biliary stent exchange 9/9: Unsuccessful ERCP 9/10: Paracentesis - 1.4L out 9/21: EGD- Esophagitis w/ active oozing and stigmata of recent bleeding, gastritis w/ hemorrhage, congested duodenal mucosa; NGT trauma  9/21: Paracentesis/biliary drain placement/cholangiogram/drain check- 3L removed  Central access: PICC placed 05/19/20 TPN start date: 05/19/20  Nutritional Goals (per RD rec on 05/18/20): kCal: 2300-2500, Protein: 130-145g, Fluid: >2.3L/day  Current Nutrition: TPN  Plan:  Continue TPN at goal rate of 100m26m, providing 132g AA, 336g CHO and 67g ILE for a total of 2342 kCal, meeting 100% of needs  Electrolytes in TPN: Na 30mE8m incr K 30mEq18mincr Mag 4mEq/L77mncr Phos 7mmol/L73montinue with no Ca, max acetate Add standard MVI and trace elements to TPN  Continue resistant SSI Q4H + 35 units regular insulin in TPN  KCL x 4 runs F/u AM labs, placement of [place] feeding tube  Jedrek Dinovo D. Imogene Gravelle, PhMina Marble, BCPS, BCCCP 9/[place]21, 9:17 AM  =========================================  Addendum: TPN filter is leaking per RN and TPN is on hold Required insulin in TPN and significant SSI use, so will not use D10W Start D5W at 100 ml/hr until 1800 when new TPN bag could be started  Brigit Doke D. Louise Victory, PhMina Marble, BCPS, BCCCP 9/[place]21, 10:48 AM

## 2020-06-04 DIAGNOSIS — R079 Chest pain, unspecified: Secondary | ICD-10-CM

## 2020-06-04 DIAGNOSIS — Z515 Encounter for palliative care: Secondary | ICD-10-CM

## 2020-06-04 DIAGNOSIS — R0789 Other chest pain: Secondary | ICD-10-CM

## 2020-06-04 LAB — COMPREHENSIVE METABOLIC PANEL
ALT: 31 U/L (ref 0–44)
AST: 31 U/L (ref 15–41)
Albumin: 1.4 g/dL — ABNORMAL LOW (ref 3.5–5.0)
Alkaline Phosphatase: 257 U/L — ABNORMAL HIGH (ref 38–126)
Anion gap: 8 (ref 5–15)
BUN: 38 mg/dL — ABNORMAL HIGH (ref 8–23)
CO2: 21 mmol/L — ABNORMAL LOW (ref 22–32)
Calcium: 8.5 mg/dL — ABNORMAL LOW (ref 8.9–10.3)
Chloride: 112 mmol/L — ABNORMAL HIGH (ref 98–111)
Creatinine, Ser: 0.77 mg/dL (ref 0.61–1.24)
GFR calc Af Amer: >60 mL/min (ref >60)
GFR calc non Af Amer: >60 mL/min (ref >60)
Glucose, Bld: 123 mg/dL — ABNORMAL HIGH (ref 70–99)
Potassium: 3.6 mmol/L (ref 3.5–5.1)
Sodium: 141 mmol/L (ref 135–145)
Total Bilirubin: 0.9 mg/dL (ref 0.3–1.2)
Total Protein: 5.7 g/dL — ABNORMAL LOW (ref 6.5–8.1)

## 2020-06-04 LAB — DIFFERENTIAL
Abs Immature Granulocytes: 0 10*3/uL (ref 0.00–0.07)
Basophils Absolute: 0 10*3/uL (ref 0.0–0.1)
Basophils Relative: 0 %
Eosinophils Absolute: 0 10*3/uL (ref 0.0–0.5)
Eosinophils Relative: 0 %
Lymphocytes Relative: 5 %
Lymphs Abs: 1 10*3/uL (ref 0.7–4.0)
Monocytes Absolute: 0.8 10*3/uL (ref 0.1–1.0)
Monocytes Relative: 4 %
Neutro Abs: 17.3 10*3/uL — ABNORMAL HIGH (ref 1.7–7.7)
Neutrophils Relative %: 91 %
nRBC: 0 /100 WBC

## 2020-06-04 LAB — GLUCOSE, CAPILLARY
Glucose-Capillary: 114 mg/dL — ABNORMAL HIGH (ref 70–99)
Glucose-Capillary: 120 mg/dL — ABNORMAL HIGH (ref 70–99)
Glucose-Capillary: 133 mg/dL — ABNORMAL HIGH (ref 70–99)
Glucose-Capillary: 135 mg/dL — ABNORMAL HIGH (ref 70–99)
Glucose-Capillary: 151 mg/dL — ABNORMAL HIGH (ref 70–99)
Glucose-Capillary: 154 mg/dL — ABNORMAL HIGH (ref 70–99)

## 2020-06-04 LAB — CULTURE, BLOOD (ROUTINE X 2)

## 2020-06-04 LAB — CBC
HCT: 29.9 % — ABNORMAL LOW (ref 39.0–52.0)
Hemoglobin: 8.5 g/dL — ABNORMAL LOW (ref 13.0–17.0)
MCH: 26.1 pg (ref 26.0–34.0)
MCHC: 28.4 g/dL — ABNORMAL LOW (ref 30.0–36.0)
MCV: 91.7 fL (ref 80.0–100.0)
Platelets: 258 10*3/uL (ref 150–400)
RBC: 3.26 MIL/uL — ABNORMAL LOW (ref 4.22–5.81)
RDW: 15.9 % — ABNORMAL HIGH (ref 11.5–15.5)
WBC: 19 10*3/uL — ABNORMAL HIGH (ref 4.0–10.5)
nRBC: 0.3 % — ABNORMAL HIGH (ref 0.0–0.2)

## 2020-06-04 LAB — TRIGLYCERIDES: Triglycerides: 180 mg/dL — ABNORMAL HIGH (ref <150)

## 2020-06-04 LAB — PREALBUMIN: Prealbumin: 8.2 mg/dL — ABNORMAL LOW (ref 18–38)

## 2020-06-04 LAB — PHOSPHORUS: Phosphorus: 2.7 mg/dL (ref 2.5–4.6)

## 2020-06-04 LAB — MAGNESIUM: Magnesium: 2.1 mg/dL (ref 1.7–2.4)

## 2020-06-04 MED ORDER — ZOLPIDEM TARTRATE 5 MG PO TABS: 5.0000 mg | ORAL_TABLET | Freq: Every day | ORAL | Status: DC

## 2020-06-04 MED ORDER — POTASSIUM CHLORIDE 10 MEQ/50ML IV SOLN
10.0000 meq | INTRAVENOUS | Status: AC
  Administered 2020-06-04 (×4): 10 meq via INTRAVENOUS
  Filled 2020-06-04 (×4): qty 50

## 2020-06-04 MED ORDER — DIPHENHYDRAMINE HCL 12.5 MG/5ML PO ELIX
12.5000 mg | ORAL_SOLUTION | Freq: Four times a day (QID) | ORAL | Status: DC | PRN
  Filled 2020-06-04: qty 5

## 2020-06-04 MED ORDER — ZOLPIDEM TARTRATE 5 MG PO TABS
5.0000 mg | ORAL_TABLET | Freq: Every day | ORAL | Status: DC
  Administered 2020-06-06: 5 mg
  Filled 2020-06-04: qty 1

## 2020-06-04 MED ORDER — ONDANSETRON HCL 4 MG/2ML IJ SOLN: 4.0000 mg | Freq: Four times a day (QID) | INTRAMUSCULAR | Status: DC | PRN

## 2020-06-04 MED ORDER — FENTANYL 50 MCG/ML IV PCA SOLN: INTRAVENOUS | Status: DC

## 2020-06-04 MED ORDER — ACETAMINOPHEN 10 MG/ML IV SOLN
1000.0000 mg | Freq: Four times a day (QID) | INTRAVENOUS | Status: AC
  Administered 2020-06-04 – 2020-06-05 (×4): 1000 mg via INTRAVENOUS
  Filled 2020-06-04 (×4): qty 100

## 2020-06-04 MED ORDER — SODIUM CHLORIDE 0.9 % IV SOLN
3.0000 g | Freq: Four times a day (QID) | INTRAVENOUS | Status: DC
  Administered 2020-06-04 – 2020-06-15 (×44): 3 g via INTRAVENOUS
  Filled 2020-06-04 (×2): qty 8
  Filled 2020-06-04 (×3): qty 3
  Filled 2020-06-04: qty 8
  Filled 2020-06-04: qty 3
  Filled 2020-06-04: qty 8
  Filled 2020-06-04 (×3): qty 3
  Filled 2020-06-04: qty 8
  Filled 2020-06-04 (×3): qty 3
  Filled 2020-06-04 (×3): qty 8
  Filled 2020-06-04 (×3): qty 3
  Filled 2020-06-04: qty 8
  Filled 2020-06-04: qty 3
  Filled 2020-06-04 (×3): qty 8
  Filled 2020-06-04 (×2): qty 3
  Filled 2020-06-04: qty 8
  Filled 2020-06-04 (×7): qty 3
  Filled 2020-06-04: qty 8
  Filled 2020-06-04 (×2): qty 3
  Filled 2020-06-04: qty 8
  Filled 2020-06-04: qty 3
  Filled 2020-06-04: qty 8
  Filled 2020-06-04 (×5): qty 3

## 2020-06-04 MED ORDER — NALOXONE HCL 0.4 MG/ML IJ SOLN: 0.4000 mg | INTRAMUSCULAR | Status: DC | PRN

## 2020-06-04 MED ORDER — FENTANYL BOLUS VIA INFUSION
50.0000 ug | INTRAVENOUS | Status: DC | PRN
  Administered 2020-06-05: 50 ug via INTRAVENOUS
  Filled 2020-06-04: qty 100

## 2020-06-04 MED ORDER — FENTANYL 2500MCG IN NS 250ML (10MCG/ML) PREMIX INFUSION
12.5000 ug/h | INTRAVENOUS | Status: DC
  Administered 2020-06-04: 12.5 ug/h via INTRAVENOUS
  Filled 2020-06-04: qty 250

## 2020-06-04 MED ORDER — ALBUMIN HUMAN 25 % IV SOLN
12.5000 g | Freq: Three times a day (TID) | INTRAVENOUS | Status: DC
  Administered 2020-06-04 – 2020-06-06 (×7): 12.5 g via INTRAVENOUS
  Filled 2020-06-04 (×7): qty 50

## 2020-06-04 MED ORDER — TRAVASOL 10 % IV SOLN
INTRAVENOUS | Status: AC
  Filled 2020-06-04: qty 1440

## 2020-06-04 MED ORDER — DIPHENHYDRAMINE HCL 50 MG/ML IJ SOLN: 12.5000 mg | Freq: Four times a day (QID) | INTRAMUSCULAR | Status: DC | PRN

## 2020-06-04 MED ORDER — SODIUM CHLORIDE 0.9% FLUSH: 9.0000 mL | INTRAVENOUS | Status: DC | PRN

## 2020-06-04 NOTE — Progress Notes (Signed)
Assisted wife, Walfred Bettendorf, with face-timing patient at this time with patient's personal cell phone. This RN gave wife updates about patient.

## 2020-06-04 NOTE — Consult Note (Signed)
Consultation Note Date: 06/04/2020   Patient Name: Adam Fowler  DOB: 11/25/1955  MRN: [id number]  Age / Sex: 64 y.o., male  PCP: Melony Overly, MD Referring Physician: Audria Nine, DO  Reason for Consultation: Establishing goals of care  HPI/Patient Profile: 64 y.o. male  with past medical history of HTN, insomnia, kidney stones who was admitted on 04/26/2020 with  post ERCP pancreatitis.  He had developed gangrenous cholecystitis in May and underwent two ERCPs 5/15 and 5/17 (for stent placement).  On 5/18 he had a subtotal cholecystectomy and placement of a JP drain.  On 5/27 he was noted to have a 8.6 cm fluid collection in the gall bladder fossa and on 6/7 this collection had developed air bubbles and a second collection had formed along the right liver edge.  Previous drains were removed a a new drain was placed by IR.  On 7/14 an ERCP was done to place a CBD stent.  On 8/18 he underwent ERCP for FCSEMS placement for persistent bile leak.  On 8/21 - 8/24 he decompensated with respiratory failure (bilateral pleural effusion), Afib with RVR and required ventilation support for volume overload and aspiration pneumonia.  On 9/9 an ERCP was attempted but unsuccessful due to gastric outlet obstruction and TPN was started on 9/10.  On 9/23 he suffered acute decompensation with abdominal pain and hypotension (63/41, pulse 130s).  Currently he remains in ICU.  He has gradually become more debilitated and has suffered episodes of delirium.  His albumin is currently 1.5.  Clinical Assessment and Goals of Care:  I have reviewed medical records including EPIC notes, labs and imaging, received report from [place] and CCM, examined the patient and spoke on the phone with his wife, [name] to discuss diagnosis prognosis, [place], EOL wishes, disposition and options.  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  I introduced  Palliative Medicine as specialized medical care for people living with serious illness. It focuses on providing relief from the symptoms and stress of a serious illness.   I asked [name] how she was doing - "I just cry".  She talks of Adam Fowler taking 1 step forward and 3 steps back.  She tells me he is the "Love of my life".  We discussed a brief life review of the patient.  [name] and Adam Fowler have been together for 45 years and have two grown children.  Their daughter [name] lives close by.  Their son is disabled from a TBI.  [name] served 12 years in the [employer].  He has had a long career since the [employer].  Prior to his cholecystectomy Adam Fowler was working.  He was mowing two acres of land, and playing with the grandchildren in the pool.  "He was fine!".   [name] is quite dismayed and upset about what has happened to Adam Fowler over the last 3 months.  As far as functional and nutritional status Adam Fowler is unable to eat due to persistent post ERCP, bile leak and gastric outlet obstruction.  He  ambulated 120 feet with PT on 9/23 but has been unable to ambulate since due to pain and hypotension.  We discussed her current illness and what it means in the larger context of her on-going co-morbidities.  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  "I just need someone to tell me".  I explained to [name] that I would like to follow Adam Fowler for a couple of days and speak with all of the physicians caring for him before rendering a range of prognosis.  I attempted to elicit values and goals of care important to the patient.  Advanced directives, concepts specific to code status.  Adam Fowler has an Scientist, physiological that indicates if he were near end of life he would not want life support.  [name] has the same).  I asked [name] to bring a copy to the hospital.  I committed to follow up with [name] and [name] on 9/27  Questions and concerns were  addressed.  The family was encouraged to call with questions or concerns.    Primary Decision Maker:  NEXT OF KIN  [name] is his default HCPOA.    SUMMARY OF RECOMMENDATIONS    Complex situation.  For pain control would consider a fentanyl or dilaudid PCA pump - being mindful of hypotension and propensity for delirium and ileus. Will follow with you. Follow up with family 9/27  Code Status/Advance Care Planning:  Full   Symptom Management:   Per primary  Additional Recommendations (Limitations, Scope, Preferences):  Full Scope Treatment  Palliative Prophylaxis:   Frequent Pain Assessment  Psycho-social/Spiritual:   Desire for further Chaplaincy support:  Will request  Prognosis:  concerning given multi organ system failure, prolonged illness with debility and recurrent decompensation, sepsis    Discharge Planning: To Be Determined      Primary Diagnoses: Present on Admission: . Post-ERCP acute pancreatitis . AKI (acute kidney injury) ([place]) . Acute respiratory failure with hypoxia ([place]) . Bilateral pleural effusion   I have reviewed the medical record, interviewed the patient and family, and examined the patient. The following aspects are pertinent.  Past Medical History:  Diagnosis Date  . History of kidney stones    passed  . Hypertension   . Insomnia    Social History   Socioeconomic History  . Marital status: Married    Spouse name: Not on file  . Number of children: Not on file  . Years of education: Not on file  . Highest education level: Not on file  Occupational History  . Not on file  Tobacco Use  . Smoking status: Never Smoker  . Smokeless tobacco: Never Used  Vaping Use  . Vaping Use: Never used  Substance and Sexual Activity  . Alcohol use: Not Currently    Comment: occasional  . Drug use: No  . Sexual activity: Not Currently    Partners: Female  Other Topics Concern  . Not on file  Social History Narrative  . Not on  file   Social Determinants of Health   Financial Resource Strain:   . Difficulty of Paying Living Expenses: Not on file  Food Insecurity:   . Worried About Charity fundraiser in the Last Year: Not on file  . Ran Out of Food in the Last Year: Not on file  Transportation Needs:   . Lack of Transportation (Medical): Not on file  . Lack  of Transportation (Non-Medical): Not on file  Physical Activity:   . Days of Exercise per Week: Not on file  . Minutes of Exercise per Session: Not on file  Stress:   . Feeling of Stress : Not on file  Social Connections:   . Frequency of Communication with Friends and Family: Not on file  . Frequency of Social Gatherings with Friends and Family: Not on file  . Attends Religious Services: Not on file  . Active Member of Clubs or Organizations: Not on file  . Attends Archivist Meetings: Not on file  . Marital Status: Not on file   Family History  Problem Relation Age of Onset  . Colon cancer Neg Hx   . Stomach cancer Neg Hx     Allergies  Allergen Reactions  . Toradol [Ketorolac Tromethamine] Other (See Comments)    delirium      Vital Signs: BP 111/67   Pulse 84   Temp 99.3 F (37.4 C) (Axillary)   Resp 12   Ht 5\' 9"  (1.753 m)   Wt 86.3 kg   SpO2 98%   BMI 28.10 kg/m  Pain Scale: 0-10 POSS *See Group Information*: 1-Acceptable,Awake and alert Pain Score: 3    SpO2: SpO2: 98 % O2 Device:SpO2: 98 % O2 Flow Rate: .O2 Flow Rate (L/min): 8 L/min    Palliative Assessment/Data: 20%     Time In: 3:30 Time Out: 5:00 Time Total: 90 min. Visit consisted of counseling and education dealing with the complex and emotionally intense issues surrounding the need for palliative care and symptom management in the setting of serious and potentially life-threatening illness. Greater than 50%  of this time was spent counseling and coordinating care related to the above assessment and plan.  Signed by: Florentina Jenny,  PA-C Palliative Medicine  Please contact Palliative Medicine Team phone at [phone] for questions and concerns.  For individual provider: See Shea Evans

## 2020-06-04 NOTE — Progress Notes (Signed)
Occupational Therapy Treatment Patient Details Name: Adam Fowler MRN: [id number] DOB: June 23, 1956 Today's Date: 06/04/2020    History of present illness Pt is a 64 yo male who presents to the ED with severe abdominal pain, nausea and vomiting. Found to have post ERCP acute pancreatits. Pt developed Hypoxic resp failure and was Intubated 8/22  and extubated 8/24. Pt also with new onset a fib and acute ileus. PMH includes HTN and ERCP.    OT comments  This 64 yo male admitted with above presents to acute OT with PLOF of being totally independent with basic ADLs. Today he is in a lot of pain in his lower back bilaterally but willing to try and work with [place]. He was able to do several activities (UE/LE exercises, supine<>sit, sit<>stand). He will continue to benefit from acute OT with follow up now recommended for CIR.  Follow Up Recommendations  CIR    Equipment Recommendations  3 in 1 bedside commode       Precautions / Restrictions Precautions Precautions: Fall Precaution Comments: abdominal drain x 2; monitor sats (low as 90% on 2 liters), monitor RR (33 with baseline 18) Restrictions Weight Bearing Restrictions: No       Mobility Bed Mobility Overal bed mobility: Needs Assistance Bed Mobility: Supine to Sit     Supine to sit: Mod assist;+2 for safety/equipment;HOB elevated Sit to supine: Mod assist;+2 for physical assistance;+2 for safety/equipment      Transfers Overall transfer level: Needs assistance Equipment used: 2 person hand held assist Transfers: Sit to/from Stand Sit to Stand: Mod assist;+2 physical assistance         General transfer comment: Side step towards the HOB +2 Mod A    Balance Overall balance assessment: Needs assistance Sitting-balance support: No upper extremity supported;Feet supported Sitting balance-Leahy Scale: Fair     Standing balance support: Bilateral upper extremity supported Standing balance-Leahy Scale: Poor                              ADL either performed or assessed with clinical judgement   ADL Overall ADL's : Needs assistance/impaired     Grooming: Supervision/safety;Set up;Wash/dry face;Sitting                   Toilet Transfer: Moderate assistance;+2 for physical assistance Toilet Transfer Details (indicate cue type and reason): Sit<>stand and side step up towards the [place] Vision/History: Wears glasses Wears Glasses: Reading only Patient Visual Report: No change from baseline            Cognition Arousal/Alertness: Awake/alert Behavior During Therapy: WFL for tasks assessed/performed;Anxious;Flat affect Overall Cognitive Status: Impaired/Different from baseline Area of Impairment: Orientation;Attention;Following commands;Safety/judgement;Problem solving                 Orientation Level: Time (August) Current Attention Level: Sustained   Following Commands: Follows one step commands inconsistently Safety/Judgement: Decreased awareness of safety;Decreased awareness of deficits   Problem Solving: Difficulty sequencing;Slow processing;Requires verbal cues;Requires tactile cues General Comments: Anxious due to pain        Exercises Other Exercises Other Exercises: Performed in supine 5 reps of AAROM shoulder flexion, 10 reps of AROM elbow flexion/extension. Heel slides and ankle pumps AROM, long arc quads seated EOB.           Pertinent Vitals/ Pain  Pain Assessment: 0-10 Faces Pain Scale: Hurts worst Pain Location: lower back both sides Pain Descriptors / Indicators: Grimacing;Guarding;Moaning;Sore;Discomfort Pain Intervention(s): Limited activity within patient's tolerance;Monitored during session;Patient requesting pain meds-RN notified;RN gave pain meds during session         Frequency  Min 2X/week        Progress Toward Goals  OT Goals(current goals can now be found in the care plan section)  Progress  towards OT goals: Not progressing toward goals - comment (decreas in medical status since initial eval)  Acute Rehab OT Goals Patient Stated Goal: go home OT Goal Formulation: With patient Time For Goal Achievement: 06/07/20 Potential to Achieve Goals: Good  Plan Discharge plan needs to be updated    Co-evaluation    PT/OT/SLP Co-Evaluation/Treatment: Yes Reason for Co-Treatment: For patient/therapist safety;To address functional/ADL transfers PT goals addressed during session: Mobility/safety with mobility;Balance;Strengthening/ROM OT goals addressed during session: Strengthening/ROM;ADL's and self-care      AM-PAC OT "6 Clicks" Daily Activity     Outcome Measure   Help from another person eating meals?: Total (NPO) Help from another person taking care of personal grooming?: A Lot Help from another person toileting, which includes using toliet, bedpan, or urinal?: A Lot Help from another person bathing (including washing, rinsing, drying)?: A Lot Help from another person to put on and taking off regular upper body clothing?: A Lot   6 Click Score: 9    End of Session Equipment Utilized During Treatment: Oxygen (2 liters)  OT Visit Diagnosis: Unsteadiness on feet (R26.81);Other abnormalities of gait and mobility (R26.89);Muscle weakness (generalized) (M62.81);Other symptoms and signs involving cognitive function;Pain Pain - part of body:  (lower back both sides)   Activity Tolerance Patient limited by pain   Patient Left in bed;with call bell/phone within reach;with bed alarm set;with restraints reapplied   Nurse Communication Mobility status        Time: 5102-5852 OT Time Calculation (min): 26 min  Charges: OT General Charges $OT Visit: 1 Visit OT Treatments $Self Care/Home Management : 8-22 mins  [place] Circle, OTR/L Acute [employer] Pager [phone] Office [phone]      Almon Register 06/04/2020, 10:33 AM

## 2020-06-04 NOTE — Progress Notes (Addendum)
[facility] Progress Note  CC:  Bile leak, pancreatitis   Subjective: He is resting calmly at this time. No abdominal pain. His RN Susie reported he pulled out his NG tube earlier today and a new NGT was placed. Intermittently confused.  Soft upper extremity restraints in use. No family at the bedside at this time.    Objective:  Vital signs in last 24 hours: Temp:  [97.8 F (36.6 C)-100.5 F (38.1 C)] 100 F (37.8 C) (09/27 0800) Pulse Rate:  [81-95] 92 (09/27 0900) Resp:  [11-25] 15 (09/27 0900) BP: (99-126)/(62-86) 122/78 (09/27 0900) SpO2:  [91 %-100 %] 97 % (09/27 0900) Weight:  [88.9 kg] 88.9 kg (09/27 0500) Last BM Date: 05/25/20   General:  Ill appearing 64 year old male. Eyes: No scleral icterus.  Heart: RRR, no murmur.  Pulm: Breath sounds clear, decreased in the bases bilaterally.  Abdomen: Distended, firm, nontender. Hypoactive bowel sounds x 4 quads when NGT briefly clamped. Drain # 1 with scant amount of pale green drainage, drain # 2 with approximately 200cc bilious drainage. Both drain insertion sites with erythema, no exudate.  Extremities:  Upper and lower extremities with 2 - 3+ pitting edema. Neurologic:  Alert and  oriented x 2. He stated he is at [facility]. Speech is comprehensible. He moves all extremities weakly.  Psych:  Alert and cooperative. Normal mood and affect.  Lab Results: Recent Labs    06/02/20 1026 06/04/20 0538  WBC 18.5* 19.0*  HGB 7.7* 8.5*  HCT 26.8* 29.9*  PLT 250 258   BMET Recent Labs    06/02/20 0550 06/03/20 0528 06/04/20 0538  NA 146* 143 141  K 3.7 3.3* 3.6  CL 116* 116* 112*  CO2 21* 21* 21*  GLUCOSE 228* 159* 123*  BUN 48* 40* 38*  CREATININE 0.90 0.70 0.77  CALCIUM 8.4* 8.3* 8.5*   LFT Recent Labs    06/04/20 0538  PROT 5.7*  ALBUMIN 1.4*  AST 31  ALT 31  ALKPHOS 257*  BILITOT 0.9   DG Abd Portable 1V  Result Date: 06/04/2020 CLINICAL DATA:  OG tube placement EXAM: PORTABLE  ABDOMEN - 1 VIEW COMPARISON:  Radiograph 06/02/2020, CT 05/31/2020 FINDINGS: Tip of the transesophageal tube terminating in the left upper quadrant with the side port beyond the GE junction. Biliary stent is in place traversed by an internal-external biliary drain. Terminus of a pigtail drain is noted as well in the right upper quadrant. Telemetry leads overlie the abdomen. Left basilar effusion. Atelectatic changes in the right lung base. No high-grade obstructive bowel gas pattern. Diffuse mild body wall edema is noted. Suspect ascites as well. Osseous structures are free of acute abnormality with mild degenerative changes in the spine, hips and pelvis. IMPRESSION: 1. Tip of the transesophageal tube terminating in the left upper quadrant in the vicinity of the gastric body with the side port beyond the GE junction. 2. Biliary stent and internal-external biliary drain in place. 3. Suspect ascites and diffuse body wall edema. Electronically Signed   By: Lovena Le M.D.   On: 06/04/2020 00:14    Assessment / Plan:  30. 64 year old male s/p cholecystectomy 01/24/2020 for acute gangrenous cholecystitis complicated by post op high output biliary fistula. S/P biloma drain placement 02/13/20. Severe post ERCP pancreatitis 8/18 (CBD stent exchange).  Failed repeat ERCP 9/9 d/t acuired duodenal stenosis. S/P internal/external biliary drain placement 9/21. S/P paracentesis 9/21, no evidence of SBP. Transferred to the ICU 9/23 with  hypotension, required Levophed which has been weaned off.  CTAP 9/23 showed acute pancreatitis with stable peripancreatic fluid collections and well-positioned internal/external biliary drain and cholecystostomy tube. Today Alk phos 257. AST 31. ALT 31. T. Bili 0.9.  -Continue TPN, labs per pharmacy  -Hepatic panel in am -Pain management per the ICU team -EUS with cyst gastrostomy deferred for now, further recommendations per Dr. Ardis Hughs  -General surgery following closely   2.  Leukocytosis, secondary to # 1. WBC 19. Blood cultures + for E facealis, Enterobacteriales and Klebsiella pneumoniae -IV Meropenem and Vancomycin per ID -CBC in am  3. Gastric outlet obstruction -Continue NGT to LIS  4. Anasarca. On Albumin IV.     LOS: 38 days   Noralyn Pick  06/04/2020, 11:26 AM    ________________________________________________________________________  Velora Heckler GI MD note:  I personally examined the patient, reviewed the data and agree with the assessment and plan described above.  He has persistent bile leak following necrotic GB surgery. He has severe post ERCP pancreatitis with evolving large peripancreatic fluid collections, perhaps walled off necrosis.  He's on IV meropenum and vanco with ID assistance.  He is on TNA.    Agree with plans for repeat CT later this week to determine timing, possibility of endoscopic drainage +/- necrosis debridement. In [place], that type of endoscopic procedure is only performed by Dr. Rush Landmark and we will keep him apprised of the patients progress.   Owens Loffler, MD [facility] Pager [phone]

## 2020-06-04 NOTE — Progress Notes (Signed)
   06/04/20 0425  Clinical Encounter Type  Visited With Patient  Visit Type Initial;Spiritual support  Referral From Palliative care team  Consult/Referral To Chaplain  Spiritual Encounters  Spiritual Needs Emotional  Stress Factors  Patient Stress Factors Family relationships;Lack of knowledge;Loss of control;Major life changes   Chaplain responded to consult for emotional support. Chaplain provided emotional and spiritual support for Pt. Chaplain encouraged Pt and listened to Pt's frustration with being in hospital for over a month. Chaplain will continue to follow up as needed.   This note was prepared by Chaplain Resident, Dante Gang, MDiv. For questions, please contact by phone at [phone].

## 2020-06-04 NOTE — Progress Notes (Signed)
Daily Progress Note   Patient Name: Adam Fowler       Date: 06/04/2020 DOB: 10/07/1955  Age: 64 y.o. MRN#: [id number] Attending Physician: Juanito Doom, MD Primary Care Physician: Melony Overly, MD Admit Date: 04/26/2020  Reason for Consultation/Follow-up: symptom management and   To discuss complex medical decision making related to patient's goals of care.  Discussed case with Drs. Hilma Favors and [place]  Subjective: Met with patient's wife and daughter [name] at bedside.  Patient with significant pain and distress when being changed/bathed by nursing.  [name] provided a copy of [name]'s Advance Directive.  If he was near EOL he would not want life prolonging measures.  However, at this point, patient and family request that full scope treatment be continued.  Family brought up the subject of transfer to [place].  Asking if there are things that could be done there that we do not have the capability to do here.  Discussed pain control with Dr. Hilma Favors and [name].  Will make adjustments.  We are concerned his severe pain is contributing to delirium.   Assessment: Significant pain today.  Able to answer questions with short appropriate answers.  Appears very debilitated.   Patient Profile/HPI:  64 y.o. male  with past medical history of HTN, insomnia, kidney stones who was admitted on 04/26/2020 with  post ERCP pancreatitis.  He had developed gangrenous cholecystitis in May and underwent two ERCPs 5/15 and 5/17 (for stent placement).  On 5/18 he had a subtotal cholecystectomy and placement of a JP drain.  On 5/27 he was noted to have a 8.6 cm fluid collection in the gall bladder fossa and on 6/7 this collection had developed air bubbles and a second collection had formed along the right liver  edge.  Previous drains were removed a a new drain was placed by IR.  On 7/14 an ERCP was done to place a CBD stent.  On 8/18 he underwent ERCP for FCSEMS placement for persistent bile leak.  On 8/21 - 8/24 he decompensated with respiratory failure (bilateral pleural effusion), Afib with RVR and required ventilation support for volume overload and aspiration pneumonia.  On 9/9 an ERCP was attempted but unsuccessful due to functional gastric outlet obstruction and TPN was started on 9/10.  On 9/23 he suffered acute decompensation with abdominal pain and  hypotension (63/41, pulse 130s).  Currently he remains in ICU.  He has gradually become more debilitated and has suffered episodes of delirium.  His albumin is currently 1.5.    Length of Stay: 38   Vital Signs: BP 122/78   Pulse 92   Temp 100 F (37.8 C) (Axillary)   Resp 15   Ht 5' 9" (1.753 m)   Wt 88.9 kg   SpO2 97%   BMI 28.94 kg/m  SpO2: SpO2: 97 % O2 Device: O2 Device: High Flow Nasal Cannula O2 Flow Rate: O2 Flow Rate (L/min): 8 L/min       Palliative Assessment/Data: 20%     Palliative Care Plan    Recommendations/Plan:  Advance Directive placed on the shadow chart.  Will initiate IV tylenol on a scheduled basis  Will initiate scheduled low dose fentanyl in addition to PRN.  Patient unable to press a PCA button.  Will need to monitor PRNs given to determine duragesic dosing.  To attempt to improve extremity edema and third spacing will give albumin IV q 8 hours.  PMT will continue to follow   Code Status:  Full code  Prognosis:   Concerning for the possibility of acute decompensation, or unfortunately even death due to necrotizing pancreatitis, bile leak, sepsis, prolonged hospitalization, malnutrition and debility.  Discharge Planning:  To Be Determined  Care plan was discussed with wife and daughter, medical team including PMT, CCM, GI.  Thank you for allowing the Palliative Medicine Team to assist in  the care of this patient.  Total time spent:  75 min.     Greater than 50%  of this time was spent counseling and coordinating care related to the above assessment and plan.  Florentina Jenny, PA-C Palliative Medicine  Please contact Palliative MedicineTeam phone at [phone] for questions and concerns between 7 am - 7 pm.   Please see AMION for individual provider pager numbers.

## 2020-06-04 NOTE — Progress Notes (Signed)
[facility] Evening [facility]  Still has a lot of pain Starting fentanyl infusion per palliative recommendations Will order end tidal CO2 to monitor carefully Holding off on [facility] transfer request until GI has a chance to confer about whether or not another procedure would be helpful ductal stent removal?  Roselie Awkward, MD [facility] Pager: [phone] Cell: [phone] If no response, call [phone]

## 2020-06-04 NOTE — Progress Notes (Signed)
Physical Therapy Treatment Patient Details Name: Adam Fowler MRN: [id number] DOB: March 03, 1956 Today's Date: 06/04/2020    History of Present Illness Pt is a 64 yo male who presents to the ED with severe abdominal pain, nausea and vomiting. Found to have post ERCP acute pancreatits. Pt developed Hypoxic resp failure and was Intubated 8/22  and extubated 8/24. Pt also with new onset a fib and acute ileus. PMH includes HTN and ERCP.     PT Comments    Pt required +2 mod assist bed mobility and transfers. Pt able to sidestep along EOB 3' +2 HH/mod assist. Mobility limited by pain. Goals updated due to decline in med status and resulting decline in mobility. D/C recommendations updated to CIR.    Follow Up Recommendations  CIR     Equipment Recommendations  None recommended by PT    Recommendations for Other Services Rehab consult     Precautions / Restrictions Precautions Precautions: Fall;Other (comment) Precaution Comments: abdominal drain x 2; monitor sats (low as 90% on 2 liters), monitor RR (33 with baseline 18) Restrictions Weight Bearing Restrictions: No    Mobility  Bed Mobility Overal bed mobility: Needs Assistance Bed Mobility: Supine to Sit     Supine to sit: Mod assist;+2 for safety/equipment;HOB elevated Sit to supine: Mod assist;+2 for physical assistance;+2 for safety/equipment   General bed mobility comments: Pt progressing into long sitting then to edge of bed. Increased time/effort  Transfers Overall transfer level: Needs assistance Equipment used: 2 person hand held assist Transfers: Sit to/from Stand Sit to Stand: Mod assist;+2 physical assistance         General transfer comment: assist to power up and stabilize balance  Ambulation/Gait Ambulation/Gait assistance: +2 physical assistance;Mod assist Gait Distance (Feet): 3 Feet Assistive device: 2 person hand held assist Gait Pattern/deviations: Decreased stride length Gait velocity: decreased    General Gait Details: sidestepping bedside to improve position for return to bed   Stairs         General stair comments: mobility limited by pain   Wheelchair Mobility    Modified Rankin (Stroke Patients Only)       Balance Overall balance assessment: Needs assistance Sitting-balance support: No upper extremity supported;Feet supported Sitting balance-Leahy Scale: Fair     Standing balance support: Bilateral upper extremity supported;During functional activity Standing balance-Leahy Scale: Poor Standing balance comment: reliant on external support                            Cognition Arousal/Alertness: Awake/alert Behavior During Therapy: WFL for tasks assessed/performed;Anxious;Flat affect Overall Cognitive Status: Impaired/Different from baseline Area of Impairment: Orientation;Attention;Following commands;Safety/judgement;Problem solving                 Orientation Level: Time Current Attention Level: Sustained Memory: Decreased short-term memory Following Commands: Follows one step commands inconsistently Safety/Judgement: Decreased awareness of safety;Decreased awareness of deficits Awareness: Emergent Problem Solving: Difficulty sequencing;Slow processing;Requires verbal cues;Requires tactile cues General Comments: Anxious due to pain      Exercises General Exercises - Lower Extremity Ankle Circles/Pumps: AROM;10 reps;Supine Long Arc Quad: AROM;Both;10 reps;Seated Heel Slides: AROM;Right;Left;10 reps;Supine Other Exercises Other Exercises: Performed in supine 5 reps of AAROM shoulder flexion, 10 reps of AROM elbow flexion/extension. Heel slides and ankle pumps AROM, long arc quads seated EOB.    General Comments General comments (skin integrity, edema, etc.): Pt on 2L O2 via [place]. SpO2 90-98% during session.      Pertinent Vitals/Pain  Pain Assessment: 0-10 Pain Score: 10-Worst pain ever Faces Pain Scale: Hurts worst Pain Location:  lower back both sides Pain Descriptors / Indicators: Grimacing;Guarding;Moaning;Sore;Discomfort Pain Intervention(s): Limited activity within patient's tolerance;Monitored during session;Repositioned;RN gave pain meds during session    Home Living                      Prior Function            PT Goals (current goals can now be found in the care plan section) Acute Rehab PT Goals Patient Stated Goal: go home Time For Goal Achievement: 06/18/20 Potential to Achieve Goals: Good Progress towards PT goals: Goals downgraded-see care plan    Frequency    Min 3X/week      PT Plan Discharge plan needs to be updated    Co-evaluation   Reason for Co-Treatment: For patient/therapist safety;To address functional/ADL transfers PT goals addressed during session: Mobility/safety with mobility;Balance;Strengthening/ROM OT goals addressed during session: Strengthening/ROM;ADL's and self-care      AM-PAC PT "6 Clicks" Mobility   Outcome Measure  Help needed turning from your back to your side while in a flat bed without using bedrails?: A Little Help needed moving from lying on your back to sitting on the side of a flat bed without using bedrails?: A Lot Help needed moving to and from a bed to a chair (including a wheelchair)?: A Lot Help needed standing up from a chair using your arms (e.g., wheelchair or bedside chair)?: A Lot Help needed to walk in hospital room?: A Lot Help needed climbing 3-5 steps with a railing? : Total 6 Click Score: 12    End of Session Equipment Utilized During Treatment: Oxygen Activity Tolerance: Patient limited by pain Patient left: in bed;with call bell/phone within reach;with bed alarm set Nurse Communication: Mobility status PT Visit Diagnosis: Unsteadiness on feet (R26.81);Muscle weakness (generalized) (M62.81);Difficulty in walking, not elsewhere classified (R26.2);Pain     Time: 3220-2542 PT Time Calculation (min) (ACUTE ONLY): 25  min  Charges:  $Therapeutic Activity: 8-22 mins                     Lorrin Goodell, PT  Office # [phone] Pager [phone]    Lorriane Shire 06/04/2020, 12:12 PM

## 2020-06-04 NOTE — Progress Notes (Signed)
Inpatient Rehab Admissions Coordinator Note:   Per PT/OT recommendations, pt was screened for CIR candidacy by Gayland Curry, MS, CCC-SLP.  At this time we are not recommending an Inpatient Rehab consult.  Pt receiving  IV pain medicine.  Will continue to follow pt's progress with therapies and medical workup.  Please contact me with questions.    Gayland Curry, [place], [place] Admissions Coordinator [phone] 06/04/20 3:09 PM

## 2020-06-04 NOTE — Progress Notes (Signed)
NAME:  Adam Fowler, MRN:  [id number], DOB:  [birth year], LOS: 22 ADMISSION DATE:  04/26/2020, CONSULTATION DATE:  9/23 REFERRING MD:  Wyline Copas, CHIEF COMPLAINT:  Hypotension   Brief History   64 yo M s/p subtotal cholecystectomy c/b persistent bile leak and biliary fistula, post ERCP necrotizing pancreatitis-- developed hypotension 9/23 and CCM consulted   Past Medical History  Kidney stones HTN Insomnia S/p cholecystectomy and abscess  Pancreatitis   Significant Hospital Events   8/20 admit 8/21-24 admitted 9/21 perc bilary drainage  9/23 drain capped. Acute decompensation in afternoon. Fluid resuscitation, starting on pressors and transferring to ICU   Consults:  PCCM GI CCS IR  Procedures:  9/3 GB fossa drain 9/21 cholangiogram and biliary drain placement  Significant Diagnostic Tests:  9/18 ct a/p w/con> moderate peripancreatic fluid extending into gastrohepatic ligament. Possible pseudocyst. Pigtail drainage catheter tip in region of gallbladder fossa. CBD stent. Persistent bibasilar opacities. L pleural effusion. Moderate ascites. Colonic diverticulosis  9/23 KUB>> cholecystostomy tube & internal/external biliary drain noted with a CBD stent in place, gastric tube within the stomach. Scattered large & small bowel gas pattern noted  9/23 ct a/p w con>>>again noted are findings of acute pancreatitis with relatively stable peripancreatic fluid collections.  Findings consistent with a gastric ileus. The esophagus is dilated and fluid-filled to at least the mid thorax. This patient would likely benefit from NG tube decompression. Worsening airspace opacities at the lung bases concerning for developing infiltrates or aspiration. Moderate-sized left-sided pleural effusion with at least partial collapse of the left lower lobe. There is a trace to small right-sided pleural effusion with mild adjacent atelectasis.  Stable small volume abdominal ascites.  Well-positioned  internal/external biliary drain. Unchanged cholecystostomy tube.   Micro Data:  8/19 SARS COV 2 > neg 8/19 blood > neg 9/10 body fluid culture > neg 9/23 blood > enterococcus faecalis, klebsiella 9/25 blood > NGTD  Antimicrobials:  9/21 zosyn x1 9/24 mero > 9/26 9/24 zosyn x1 9/24 vanc > 9/26 9/27 unasyn >   Interim history/subjective:  Still has a lot of pain issues  Objective   Blood pressure 106/71, pulse 90, temperature 97.8 F (36.6 C), temperature source Axillary, resp. rate 13, height 5\' 9"  (1.753 m), weight 88.9 kg, SpO2 98 %.        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, s/p ERCP who has necrotizing pancreatitis with perihepatic fluid collections GB fossa and biliary drains in place Plan repeat CT abdomen to assess peripancreatic fluid per general surgery (end of week) NPO TPN Get rid of all home oral medications (make full NPO) with exception of zolpidem  Bacteremia in setting of peritonitis Septic shock > resolved Antibiotics per ID  Acute hypoxemic respiratory failure  > resolved Minimize sedation  Esophagitis BID PPI  Acute metabolic encephalopathy : narcotic related, sepsis related Minimize narcotics  Appreciate palliative medicine consult> PCA seems like a reasonable idea but would love to use non-narcotic pain management.  Block?  Hypertension Monitor  Best practice:  Diet: TPN Pain/Anxiety/Delirium protocol (if indicated): n/a VAP protocol (if indicated): n/a DVT prophylaxis: lovenox GI prophylaxis: BID protonix Glucose control: SSI Mobility: out of bed as able Code Status: full Family Communication: I updated his wife at bedside, she is interested in transfer to [place]. She says goals of care are full medical support with intent for recovery and going home.  She is not interested in comfort measures. Disposition:   Labs   CBC: Recent Labs  Lab 05/31/20 0458 05/31/20 1327 06/01/20 0809 06/02/20 1026 06/04/20 0538  WBC 26.2* 15.6* 42.2* 18.5* 19.0*  NEUTROABS  --   --  39.2* 16.0* 17.3*  HGB 8.2* 8.6* 7.8* 7.7* 8.5*  HCT 28.6* 30.8* 27.9* 26.8* 29.9*  MCV 92.6 96.3 95.9 93.7 91.7  PLT 311 290 296 250 768    Basic Metabolic Panel: Recent Labs  Lab 05/31/20 0458 05/31/20 0458 05/31/20 1327 05/31/20 1327 06/01/20 0516 06/01/20 1143 06/02/20 0550 06/03/20 0528 06/04/20 0538  NA 143   < > 145  --  145  --  146* 143 141  K 4.7   < > 4.2   < > 5.3* 4.6 3.7 3.3* 3.6  CL 111   < > 115*  --  114*  --  116* 116* 112*  CO2 22   < > 18*  --  21*  --  21* 21* 21*  GLUCOSE 206*   < > 69*  --  209*  --  228* 159* 123*  BUN 47*   < > 52*  --  51*  --  48* 40* 38*  CREATININE 1.05   < > 1.39*  --  1.23  --  0.90 0.70 0.77  CALCIUM 8.6*   < > 8.6*  --  8.2*  --  8.4* 8.3* 8.5*  MG 2.4  --   --   --   --   --  2.3 2.0 2.1  PHOS 3.4  --   --   --   --   --  2.7 2.7 2.7   < > = values in this interval not displayed.   GFR: Estimated Creatinine Clearance: 102.9 mL/min (by C-G formula based on SCr of 0.77 mg/dL). Recent Labs  Lab 05/31/20 1327 05/31/20 1834 06/01/20 0809 06/01/20 0856 06/01/20 1157 06/02/20 0550 06/02/20 1026 06/03/20 0528 06/04/20 0538  PROCALCITON  --   --  30.80  --   --  16.76  --  8.94  --   WBC 15.6*  --  42.2*  --   --   --  18.5*  --  19.0*  LATICACIDVEN  --  5.3*  2.9* 3.5* 2.8*  --   --   --   --     Liver Function Tests: Recent Labs  Lab 05/31/20 0458 05/31/20 1327 06/01/20 0856 06/02/20 0550 06/04/20 0538  AST 25 32 48* 27 31  ALT 36 33 39 30 31  ALKPHOS 313* 472* 318* 246* 257*  BILITOT 0.7 2.4* 1.2 1.1 0.9  PROT 5.7* 5.5* 6.0* 5.6* 5.7*  ALBUMIN 1.3* 1.7* 1.7* 1.5* 1.4*   No results for input(s): LIPASE, AMYLASE in the last 168 hours. No results for input(s): AMMONIA in the last 168 hours.  ABG    Component Value Date/Time   PHART 7.295 (L) 05/31/2020 1625   PCO2ART 38.6 05/31/2020 1625  PO2ART 62.0 (L) 05/31/2020 1625   HCO3 18.0 (L) 05/31/2020 1625   TCO2 26 04/30/2020 0241   ACIDBASEDEF 7.1 (H) 05/31/2020 1625   O2SAT 87.5 05/31/2020 1625     Coagulation Profile: Recent Labs  Lab 05/29/20 0404 05/31/20 1415  INR 1.4* 1.5*    Cardiac Enzymes: No results for input(s): CKTOTAL, CKMB, CKMBINDEX, TROPONINI in the last 168 hours.  HbA1C: Hgb A1c MFr Bld  Date/Time Value Ref Range Status  04/29/2020 02:02 AM 5.6 4.8 - 5.6 % Final    Comment:    (NOTE) Pre diabetes:          5.7%-6.4%  Diabetes:              >6.4%  Glycemic control for   <7.0% adults with diabetes   02/13/2020 04:55 PM 6.0 (H) 4.8 - 5.6 % Final    Comment:    (NOTE) Pre diabetes:          5.7%-6.4% Diabetes:              >6.4% Glycemic control for   <7.0% adults with diabetes     CBG: Recent Labs  Lab 06/03/20 1153 06/03/20 1551 06/03/20 1954 06/03/20 2352 06/04/20 0333  GLUCAP 105* 127* 149* 122* 154*     Critical care time: 31 minutes    Roselie Awkward, MD [facility] Pager: [phone] Cell: [phone] If no response, call [phone]

## 2020-06-04 NOTE — Progress Notes (Signed)
Subjective: Patient has become increasingly delirious. Seen by palliative care yesterday. Blood cultures positive for Klebsiella and Enterococcus. WBC stable at 19 today. Both drains remain to gravity but are not being flushed. No bowel function. Hemodynamically stable off pressors.  Objective: Vital signs in last 24 hours: Temp:  [97.8 F (36.6 C)-100.5 F (38.1 C)] 100 F (37.8 C) (09/27 0800) Pulse Rate:  [81-103] 90 (09/27 0800) Resp:  [11-30] 12 (09/27 0800) BP: (99-136)/(59-86) 114/70 (09/27 0800) SpO2:  [91 %-100 %] 98 % (09/27 0800) Weight:  [88.9 kg] 88.9 kg (09/27 0500) Last BM Date: 05/25/20  Intake/Output from previous day: 09/26 0701 - 09/27 0700 In: 3364.5 [P.O.:60; I.V.:2406.7; IV Piggyback:897.8] Out: 2470 [Urine:950; Emesis/NG output:1000; Drains:520] Intake/Output this shift: No intake/output data recorded.  PE: General: NAD, disoriented Neuro: disoriented HEENT: NG tube in place, draining gastric contents Resp: normal work of breathing, on nasal cannula Abdomen: distended but soft, RUQ PBD with bilious fluid in bag. Percutaneous drain with serous fluid.  Lab Results:  Recent Labs    06/02/20 1026 06/04/20 0538  WBC 18.5* 19.0*  HGB 7.7* 8.5*  HCT 26.8* 29.9*  PLT 250 258   BMET Recent Labs    06/03/20 0528 06/04/20 0538  NA 143 141  K 3.3* 3.6  CL 116* 112*  CO2 21* 21*  GLUCOSE 159* 123*  BUN 40* 38*  CREATININE 0.70 0.77  CALCIUM 8.3* 8.5*   PT/INR No results for input(s): LABPROT, INR in the last 72 hours. CMP     Component Value Date/Time   NA 141 06/04/2020 0538   K 3.6 06/04/2020 0538   CL 112 (H) 06/04/2020 0538   CO2 21 (L) 06/04/2020 0538   GLUCOSE 123 (H) 06/04/2020 0538   BUN 38 (H) 06/04/2020 0538   CREATININE 0.77 06/04/2020 0538   CALCIUM 8.5 (L) 06/04/2020 0538   PROT 5.7 (L) 06/04/2020 0538   ALBUMIN 1.4 (L) 06/04/2020 0538   AST 31 06/04/2020 0538   ALT 31 06/04/2020 0538   ALKPHOS 257 (H) 06/04/2020 0538    BILITOT 0.9 06/04/2020 0538   GFRNONAA >60 06/04/2020 0538   GFRAA >60 06/04/2020 0538   Lipase     Component Value Date/Time   LIPASE 27 05/16/2020 1905       Studies/Results: DG Abd Portable 1V  Result Date: 06/04/2020 CLINICAL DATA:  OG tube placement EXAM: PORTABLE ABDOMEN - 1 VIEW COMPARISON:  Radiograph 06/02/2020, CT 05/31/2020 FINDINGS: Tip of the transesophageal tube terminating in the left upper quadrant with the side port beyond the GE junction. Biliary stent is in place traversed by an internal-external biliary drain. Terminus of a pigtail drain is noted as well in the right upper quadrant. Telemetry leads overlie the abdomen. Left basilar effusion. Atelectatic changes in the right lung base. No high-grade obstructive bowel gas pattern. Diffuse mild body wall edema is noted. Suspect ascites as well. Osseous structures are free of acute abnormality with mild degenerative changes in the spine, hips and pelvis. IMPRESSION: 1. Tip of the transesophageal tube terminating in the left upper quadrant in the vicinity of the gastric body with the side port beyond the GE junction. 2. Biliary stent and internal-external biliary drain in place. 3. Suspect ascites and diffuse body wall edema. Electronically Signed   By: Lovena Le M.D.   On: 06/04/2020 00:14    Anti-infectives: Anti-infectives (From admission, onward)   Start     Dose/Rate Route Frequency Ordered Stop   06/02/20 0300  meropenem (  MERREM) 2 g in sodium chloride 0.9 % 100 mL IVPB        2 g 200 mL/hr over 30 Minutes Intravenous Every 8 hours 06/02/20 0248     06/02/20 0300  vancomycin (VANCOCIN) IVPB 1000 mg/200 mL premix        1,000 mg 200 mL/hr over 60 Minutes Intravenous Every 12 hours 06/02/20 0248     06/01/20 1000  piperacillin-tazobactam (ZOSYN) IVPB 3.375 g  Status:  Discontinued        3.375 g 12.5 mL/hr over 240 Minutes Intravenous Every 8 hours 06/01/20 0952 06/02/20 0248   05/29/20 0800   piperacillin-tazobactam (ZOSYN) IVPB 3.375 g        3.375 g 12.5 mL/hr over 240 Minutes Intravenous To Radiology 05/28/20 1328 05/29/20 1943   05/04/20 1230  meropenem (MERREM) 1 g in sodium chloride 0.9 % 100 mL IVPB  Status:  Discontinued        1 g 200 mL/hr over 30 Minutes Intravenous Every 8 hours 05/04/20 1156 05/19/20 0826   05/01/20 1400  piperacillin-tazobactam (ZOSYN) IVPB 3.375 g        3.375 g 12.5 mL/hr over 240 Minutes Intravenous Every 8 hours 05/01/20 1013 05/03/20 0907   04/29/20 2000  vancomycin (VANCOCIN) IVPB 1000 mg/200 mL premix  Status:  Discontinued        1,000 mg 200 mL/hr over 60 Minutes Intravenous Every 12 hours 04/29/20 0653 04/30/20 0831   04/29/20 0745  vancomycin (VANCOREADY) IVPB 1500 mg/300 mL        1,500 mg 150 mL/hr over 120 Minutes Intravenous  Once 04/29/20 0653 04/29/20 1129   04/26/20 2300  piperacillin-tazobactam (ZOSYN) IVPB 3.375 g        3.375 g 12.5 mL/hr over 240 Minutes Intravenous Every 8 hours 04/26/20 1716 05/01/20 0559   04/26/20 1700  piperacillin-tazobactam (ZOSYN) IVPB 3.375 g        3.375 g 100 mL/hr over 30 Minutes Intravenous  Once 04/26/20 1655 04/26/20 1844       Assessment/Plan 64 yo male s/p subtotal cholecystectomy, complicated by persistent bile leak, 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 S/p PTBD placement by IR. - Keep biliary and gallbladder fossa drains to gravity. Biliary drain should be flushed BID to prevent occlusion. - Antibiotics for bacteremia - Trend LFTs daily - Remain NPO with NG tube to suction. Minimize PO meds as patient will not absorb with gastric outlet obstruction, and NG clamping increases risk of aspiration. - Optimize nutrition - continue TPN. - Plan for repeat CT scan at the end of this week to assess peripancreatic fluid. Can discuss endoscopic drainage once necrosis matures, usually at about 6 weeks into course of pancreatitis. - Surgery will  continue to follow   LOS: 38 days   Michaelle Birks, MD [facility] Surgery General, Hepatobiliary and Pancreatic Surgery 06/04/20  8:51 AM

## 2020-06-04 NOTE — Progress Notes (Addendum)
[facility]  Date of Admission:  04/26/2020     Total days of antibiotics 5         ASSESSMENT:  Adam Fowler is a 63 y/o male with polymicrobial bacteremia with Enterococcus faecalis and Klebsiella pneumoniae with blood cultures from 9/25 without growth to date. Antibiotics have been narrowed to Unasyn from vancomycin and meropenem. TTE technically limited secondary to poor sound wave transmission. Will need TEE to rule out endocarditis given presence of Enterococcus and to determine duration of antibiotic therapy. May need to replace/exchange PICC line as it was present during bacteremia with concern for possible seeding. General surgery to obtain CT scan at the end of the week to assess peripancreatic fluid and will continue with drains at the present time.   PLAN:  1. Continue Unasyn. 2. Monitor blood cultures for clearance of bacteremia. 3. Will need TEE when able to rule out endocarditis. 4. May need PICC exchange/line holiday once cultures are clear.  5. Drain management and imaging per Primary Team and General Surgery.   Principal Problem:   Post-ERCP acute pancreatitis Active Problems:   Severe sepsis (HCC)   Polycythemia   AKI (acute kidney injury) ([place])   Metabolic acidosis   Acute respiratory failure with hypoxia (HCC)   Bilateral pleural effusion   Ileus (HCC)   Bile duct leak   Gastric outlet obstruction   Abdominal distension   ARDS (adult respiratory distress syndrome) ([place])   Biloma following surgery   Acute blood loss anemia   Palliative care encounter   Chest pain   . Chlorhexidine Gluconate Cloth  6 each Topical Daily  . enoxaparin (LOVENOX) injection  40 mg Subcutaneous Q24H  . influenza vac split quadrivalent PF  0.5 mL Intramuscular Tomorrow-1000  . insulin aspart  0-20 Units Subcutaneous Q4H  . mouth rinse  15 mL Mouth Rinse BID  . pantoprazole (PROTONIX) IV  40 mg Intravenous Q12H  . sodium chloride flush  10-40 mL  Intracatheter Q12H  . sodium chloride flush  5 mL Intracatheter Q8H  . zolpidem  5 mg Per Tube QHS    SUBJECTIVE:  Mildly elevated temperature of 100 overnight. Pulled out NG tube which has been replaced. Feeling okay.   Allergies  Allergen Reactions  . Toradol [Ketorolac Tromethamine] Other (See Comments)    delirium     Review of Systems: Review of Systems  Constitutional: Negative for chills, fever and weight loss.  Respiratory: Negative for cough, shortness of breath and wheezing.   Cardiovascular: Negative for chest pain and leg swelling.  Gastrointestinal: Positive for abdominal pain. Negative for constipation, diarrhea, nausea and vomiting.  Skin: Negative for rash.      OBJECTIVE: Vitals:   06/04/20 0700 06/04/20 0800 06/04/20 0900 06/04/20 1100  BP: 125/82 114/70 122/78 111/66  Pulse: 89 90 92 94  Resp: 13 12 15 18   Temp:  100 F (37.8 C)    TempSrc:  Axillary    SpO2: 98% 98% 97% 96%  Weight:      Height:       Body mass index is 28.94 kg/m.  Physical Exam Constitutional:      General: He is not in acute distress.    Appearance: He is well-developed. He is ill-appearing.     Interventions: He is restrained. Nasal cannula in place.     Comments: Lying in bed with head of bed elevated; lethargic/arousable  Cardiovascular:     Rate and Rhythm: Normal rate and  regular rhythm.     Heart sounds: Normal heart sounds.     Comments: PICC in right upper arm is clean and dry. No evidence of infection.  Pulmonary:     Effort: Pulmonary effort is normal.     Breath sounds: Normal breath sounds.  Abdominal:     Comments: Drain sites with clean and dry dressings. There is a small amount of redness around both sites of entry.   Skin:    General: Skin is warm and dry.  Neurological:     Mental Status: He is alert and oriented to person, place, and time.  Psychiatric:        Behavior: Behavior normal.        Thought Content: Thought content normal.         Judgment: Judgment normal.     Lab Results Lab Results  Component Value Date   WBC 19.0 (H) 06/04/2020   HGB 8.5 (L) 06/04/2020   HCT 29.9 (L) 06/04/2020   MCV 91.7 06/04/2020   PLT 258 06/04/2020    Lab Results  Component Value Date   CREATININE 0.77 06/04/2020   BUN 38 (H) 06/04/2020   NA 141 06/04/2020   K 3.6 06/04/2020   CL 112 (H) 06/04/2020   CO2 21 (L) 06/04/2020    Lab Results  Component Value Date   ALT 31 06/04/2020   AST 31 06/04/2020   ALKPHOS 257 (H) 06/04/2020   BILITOT 0.9 06/04/2020     Microbiology: Recent Results (from the past 240 hour(s))  Culture, blood (Routine X 2) w Reflex to ID Panel     Status: Abnormal   Collection Time: 05/31/20  6:34 PM   Specimen: BLOOD LEFT HAND  Result Value Ref Range Status   Specimen Description BLOOD LEFT HAND  Final   Special Requests   Final    BOTTLES DRAWN AEROBIC ONLY Blood Culture results may not be optimal due to an inadequate volume of blood received in culture bottles   Culture  Setup Time   Final    GRAM NEGATIVE RODS AEROBIC BOTTLE ONLY CRITICAL VALUE NOTED.  VALUE IS CONSISTENT WITH PREVIOUSLY REPORTED AND CALLED VALUE.    Culture (A)  Final    KLEBSIELLA PNEUMONIAE SUSCEPTIBILITIES PERFORMED ON PREVIOUS CULTURE WITHIN THE LAST 5 DAYS. Performed at [facility], [place] [address], [place], [place] [address]    Report Status 06/04/2020 FINAL  Final  Culture, blood (Routine X 2) w Reflex to ID Panel     Status: Abnormal   Collection Time: 05/31/20  6:37 PM   Specimen: BLOOD  Result Value Ref Range Status   Specimen Description BLOOD LEFT ANTECUBITAL  Final   Special Requests   Final    BOTTLES DRAWN AEROBIC AND ANAEROBIC Blood Culture results may not be optimal due to an inadequate volume of blood received in culture bottles   Culture  Setup Time   Final    GRAM POSITIVE COCCI GRAM NEGATIVE RODS AEROBIC BOTTLE ONLY CRITICAL RESULT CALLED TO, READ BACK BY AND VERIFIED WITHLenna Sciara [facility]  06/02/20 0209 JDW Performed at [facility], [place] [address], [place], [place] [address]    Culture ENTEROCOCCUS FAECALIS KLEBSIELLA PNEUMONIAE  (A)  Final   Report Status 06/04/2020 FINAL  Final   Organism ID, Bacteria ENTEROCOCCUS FAECALIS  Final   Organism ID, Bacteria KLEBSIELLA PNEUMONIAE  Final      Susceptibility   Enterococcus faecalis - MIC*    AMPICILLIN <=2  SENSITIVE Sensitive     VANCOMYCIN 1 SENSITIVE Sensitive     GENTAMICIN SYNERGY SENSITIVE Sensitive     * ENTEROCOCCUS FAECALIS   Klebsiella pneumoniae - MIC*    AMPICILLIN >=32 RESISTANT Resistant     CEFAZOLIN <=4 SENSITIVE Sensitive     CEFEPIME <=0.12 SENSITIVE Sensitive     CEFTAZIDIME <=1 SENSITIVE Sensitive     CEFTRIAXONE <=0.25 SENSITIVE Sensitive     CIPROFLOXACIN <=0.25 SENSITIVE Sensitive     GENTAMICIN <=1 SENSITIVE Sensitive     IMIPENEM <=0.25 SENSITIVE Sensitive     TRIMETH/SULFA <=20 SENSITIVE Sensitive     AMPICILLIN/SULBACTAM 8 SENSITIVE Sensitive     PIP/TAZO <=4 SENSITIVE Sensitive     * KLEBSIELLA PNEUMONIAE  Blood Culture ID Panel (Reflexed)     Status: Abnormal   Collection Time: 05/31/20  6:37 PM  Result Value Ref Range Status   Enterococcus faecalis DETECTED (A) NOT DETECTED Final    Comment: CRITICAL RESULT CALLED TO, READ BACK BY AND VERIFIED WITH: J LEDFORD [facility] 06/02/20 0209 JDW    Enterococcus Faecium NOT DETECTED NOT DETECTED Final   Listeria monocytogenes NOT DETECTED NOT DETECTED Final   Staphylococcus species NOT DETECTED NOT DETECTED Final   Staphylococcus aureus (BCID) NOT DETECTED NOT DETECTED Final   Staphylococcus epidermidis NOT DETECTED NOT DETECTED Final   Staphylococcus lugdunensis NOT DETECTED NOT DETECTED Final   Streptococcus species NOT DETECTED NOT DETECTED Final   Streptococcus agalactiae NOT DETECTED NOT DETECTED Final   Streptococcus pneumoniae NOT DETECTED NOT DETECTED Final   Streptococcus pyogenes NOT DETECTED NOT DETECTED Final    A.calcoaceticus-baumannii NOT DETECTED NOT DETECTED Final   Bacteroides fragilis NOT DETECTED NOT DETECTED Final   Enterobacterales DETECTED (A) NOT DETECTED Final    Comment: Enterobacterales represent a large order of gram negative bacteria, not a single organism. CRITICAL RESULT CALLED TO, READ BACK BY AND VERIFIED WITH: J LEDFORD [facility] 06/02/20 0209 JDW    Enterobacter cloacae complex NOT DETECTED NOT DETECTED Final   Escherichia coli NOT DETECTED NOT DETECTED Final   Klebsiella aerogenes NOT DETECTED NOT DETECTED Final   Klebsiella oxytoca NOT DETECTED NOT DETECTED Final   Klebsiella pneumoniae DETECTED (A) NOT DETECTED Final    Comment: CRITICAL RESULT CALLED TO, READ BACK BY AND VERIFIED WITH: J LEDFORD [facility] 06/02/20 0209 JDW    Proteus species NOT DETECTED NOT DETECTED Final   Salmonella species NOT DETECTED NOT DETECTED Final   Serratia marcescens NOT DETECTED NOT DETECTED Final   Haemophilus influenzae NOT DETECTED NOT DETECTED Final   Neisseria meningitidis NOT DETECTED NOT DETECTED Final   Pseudomonas aeruginosa NOT DETECTED NOT DETECTED Final   Stenotrophomonas maltophilia NOT DETECTED NOT DETECTED Final   Candida albicans NOT DETECTED NOT DETECTED Final   Candida auris NOT DETECTED NOT DETECTED Final   Candida glabrata NOT DETECTED NOT DETECTED Final   Candida krusei NOT DETECTED NOT DETECTED Final   Candida parapsilosis NOT DETECTED NOT DETECTED Final   Candida tropicalis NOT DETECTED NOT DETECTED Final   Cryptococcus neoformans/gattii NOT DETECTED NOT DETECTED Final   CTX-M ESBL NOT DETECTED NOT DETECTED Final   Carbapenem resistance IMP NOT DETECTED NOT DETECTED Final   Carbapenem resistance KPC NOT DETECTED NOT DETECTED Final   Carbapenem resistance NDM NOT DETECTED NOT DETECTED Final   Carbapenem resist OXA 48 LIKE NOT DETECTED NOT DETECTED Final   Vancomycin resistance NOT DETECTED NOT DETECTED Final   Carbapenem resistance VIM NOT DETECTED NOT DETECTED Final  Comment: Performed at [facility], [place] [address], [place], [place] [address]  Culture, blood (single)     Status: None (Preliminary result)   Collection Time: 06/02/20  9:18 AM   Specimen: BLOOD LEFT ARM  Result Value Ref Range Status   Specimen Description BLOOD LEFT ARM  Final   Special Requests   Final    BOTTLES DRAWN AEROBIC AND ANAEROBIC Blood Culture adequate volume   Culture   Final    NO GROWTH 2 DAYS Performed at [facility], [address], [place], [place] [address]    Report Status PENDING  Incomplete  Culture, blood (single)     Status: None (Preliminary result)   Collection Time: 06/02/20 10:26 AM   Specimen: BLOOD  Result Value Ref Range Status   Specimen Description BLOOD SITE NOT SPECIFIED  Final   Special Requests   Final    BOTTLES DRAWN AEROBIC AND ANAEROBIC Blood Culture adequate volume   Culture   Final    NO GROWTH 2 DAYS Performed at [facility], [address], [place], [place] [address]    Report Status PENDING  Incomplete     Terri Piedra, [facility]  06/04/2020  12:11 PM

## 2020-06-04 NOTE — Progress Notes (Addendum)
PHARMACY - TOTAL PARENTERAL NUTRITION CONSULT NOTE  Indication:  Severe pancreatitis, biliary fistula, GOO  Patient Measurements: Height: 5' 9" (175.3 cm) Weight: 88.9 kg (195 lb 15.8 oz) IBW/kg (Calculated) : 70.7 TPN AdjBW (KG): 86.1 Body mass index is 28.94 kg/m.  Assessment:  64 YOM admitted 04/26/20, post-ERCP 8/18 and biliary stent exchange for persistent postoperative bile leak related to recent cholecystectomy with abscess s/p percutaneous drainage. Patient developed acute pancreatitis, has had a prolonged hospitalization complicated by VDRF (extubated 8/24), AKI, encephalopathy, and afib. TPN initiated 9/11 for acute post-ERCP pancreatitis/ileus and persistent biliary leak. Significant gastric fluid retention from functional outlet obstruction. Pt still with very poor PO intake.  9/23 - acutely decompensated requiring pressors and non-rebreather. Patient now in septic shock.  9/26 - TPN filter leaked and held. D10W held and D5W initiated at 100mL/hr until new TPN started at 1800   Glucose / Insulin: A1c 5.6% - CBGs <180.  Required 13 units rSSI + 35 units in TPN.  Electrolytes: K 3.6 w KCl x4 runs(goal >/= 4 for ileus), Cl 112 high. CO2 21 low. (max acetate in TPN), CoCa up to 10.5 (none in TPN), Phos 2.7 low normal  Renal: SCr < 1, BUN down 38 LFTs / TGs: LFTs WNL except alk phos up to 257, tbili WNL, TG mildly elevated 180 Prealbumin / albumin: Prealbumin 8.2 (stable). Albumin 1.4 Intake / Output; MIVF: UOP 0.4 mL/kg/hr, NG down to 1000mL, drain up to 520mL, net -1.3L.  LBM 9/20.    GI Imaging:  9/5: CT - severe pancreatitis 9/7: HIDA - persistent bile leak with accumulations/paracolic gutter 9/9: CT - pancreatitis (peripancreatic fat stranding and peripancreatic fluid, percutaneous drain at the gallbladder fossa, unchanged biliary stent with no intrahepatic biliary duct dilatation, bilateral pleural effusions 9/15: KUB - diffuse colonic stool, no bowel obstruction/free  air 9/18: CT - mild colonic diverticulosis, pseudocyst involvement and less likely abscess 9/23: KUB - no evidence of bowel obstruction/ileus 9/23: CT - Gastric ileus, dilated/fluid-filled esophagus, small volume abd ascites 9/26: KUB - Suspect ascites and diffuse body wall edema Surgeries / Procedures: 8/18: ERCP/biliary stent exchange 9/9: Unsuccessful ERCP 9/10: Paracentesis - 1.4L out 9/21: EGD- Esophagitis w/ active oozing and stigmata of recent bleeding, gastritis w/ hemorrhage, congested duodenal mucosa; NGT trauma  9/21: Paracentesis/biliary drain placement/cholangiogram/drain check- 3L removed  Central access: PICC placed 05/19/20 TPN start date: 05/19/20  Nutritional Goals (per RD rec on 05/18/20): kCal: 2300-2500, Protein: 130-145g, Fluid: >2.3L/day  Current Nutrition: TPN  Plan:  Adjust TPN formula to maximize protein Continue TPN at goal rate of 100mL/hr, providing 144g AA, 336g CHO and 67g ILE for a total of 2390 kCal, meeting 100% of needs  Electrolytes in TPN: Na 30mEq/L, incr K 35mEq/L, Mag 4mEq/L, Phos 7mmol/L, continue with no Ca, max acetate KCl x4 runs Add standard MVI and trace elements to TPN  Continue resistant SSI Q4H + 35 units regular insulin in TPN  F/u AM labs  Sarah Ricketts, PharmD Candidate 06/04/2020, 7:11 AM   D. , PharmD, BCPS, BCCCP 06/04/2020, 10:14 AM    

## 2020-06-04 NOTE — Plan of Care (Signed)
?  Problem: Elimination: ?Goal: Will not experience complications related to urinary retention ?Outcome: Progressing ?  ?

## 2020-06-05 ENCOUNTER — Inpatient Hospital Stay (HOSPITAL_COMMUNITY): Payer: 59

## 2020-06-05 DIAGNOSIS — T8189XD Other complications of procedures, not elsewhere classified, subsequent encounter: Secondary | ICD-10-CM

## 2020-06-05 LAB — CBC WITH DIFFERENTIAL/PLATELET
Abs Immature Granulocytes: 0.5 10*3/uL — ABNORMAL HIGH (ref 0.00–0.07)
Basophils Absolute: 0.2 10*3/uL — ABNORMAL HIGH (ref 0.0–0.1)
Basophils Relative: 1 %
Eosinophils Absolute: 0.2 10*3/uL (ref 0.0–0.5)
Eosinophils Relative: 1 %
HCT: 28.5 % — ABNORMAL LOW (ref 39.0–52.0)
Hemoglobin: 8.5 g/dL — ABNORMAL LOW (ref 13.0–17.0)
Lymphocytes Relative: 4 %
Lymphs Abs: 0.7 10*3/uL (ref 0.7–4.0)
MCH: 26.9 pg (ref 26.0–34.0)
MCHC: 29.8 g/dL — ABNORMAL LOW (ref 30.0–36.0)
MCV: 90.2 fL (ref 80.0–100.0)
Monocytes Absolute: 0.7 10*3/uL (ref 0.1–1.0)
Monocytes Relative: 4 %
Myelocytes: 1 %
Neutro Abs: 15.8 10*3/uL — ABNORMAL HIGH (ref 1.7–7.7)
Neutrophils Relative %: 87 %
Platelets: 267 10*3/uL (ref 150–400)
Promyelocytes Relative: 2 %
RBC: 3.16 MIL/uL — ABNORMAL LOW (ref 4.22–5.81)
RDW: 15.7 % — ABNORMAL HIGH (ref 11.5–15.5)
WBC: 18.2 10*3/uL — ABNORMAL HIGH (ref 4.0–10.5)
nRBC: 0 /100 WBC
nRBC: 0.3 % — ABNORMAL HIGH (ref 0.0–0.2)

## 2020-06-05 LAB — GLUCOSE, CAPILLARY
Glucose-Capillary: 120 mg/dL — ABNORMAL HIGH (ref 70–99)
Glucose-Capillary: 133 mg/dL — ABNORMAL HIGH (ref 70–99)
Glucose-Capillary: 147 mg/dL — ABNORMAL HIGH (ref 70–99)
Glucose-Capillary: 153 mg/dL — ABNORMAL HIGH (ref 70–99)
Glucose-Capillary: 168 mg/dL — ABNORMAL HIGH (ref 70–99)
Glucose-Capillary: 169 mg/dL — ABNORMAL HIGH (ref 70–99)

## 2020-06-05 LAB — COMPREHENSIVE METABOLIC PANEL
ALT: 27 U/L (ref 0–44)
AST: 27 U/L (ref 15–41)
Albumin: 1.8 g/dL — ABNORMAL LOW (ref 3.5–5.0)
Alkaline Phosphatase: 210 U/L — ABNORMAL HIGH (ref 38–126)
Anion gap: 8 (ref 5–15)
BUN: 34 mg/dL — ABNORMAL HIGH (ref 8–23)
CO2: 20 mmol/L — ABNORMAL LOW (ref 22–32)
Calcium: 8.4 mg/dL — ABNORMAL LOW (ref 8.9–10.3)
Chloride: 112 mmol/L — ABNORMAL HIGH (ref 98–111)
Creatinine, Ser: 0.73 mg/dL (ref 0.61–1.24)
GFR calc Af Amer: >60 mL/min (ref >60)
GFR calc non Af Amer: >60 mL/min (ref >60)
Glucose, Bld: 179 mg/dL — ABNORMAL HIGH (ref 70–99)
Potassium: 3.7 mmol/L (ref 3.5–5.1)
Sodium: 140 mmol/L (ref 135–145)
Total Bilirubin: 0.9 mg/dL (ref 0.3–1.2)
Total Protein: 5.6 g/dL — ABNORMAL LOW (ref 6.5–8.1)

## 2020-06-05 MED ORDER — POTASSIUM CHLORIDE 10 MEQ/50ML IV SOLN
10.0000 meq | INTRAVENOUS | Status: AC
  Administered 2020-06-05 (×3): 10 meq via INTRAVENOUS
  Filled 2020-06-05 (×3): qty 50

## 2020-06-05 MED ORDER — LORAZEPAM 2 MG/ML IJ SOLN
0.5000 mg | Freq: Two times a day (BID) | INTRAMUSCULAR | Status: DC | PRN
  Administered 2020-06-05 – 2020-06-14 (×9): 0.5 mg via INTRAVENOUS
  Filled 2020-06-05 (×9): qty 1

## 2020-06-05 MED ORDER — TRAVASOL 10 % IV SOLN
INTRAVENOUS | Status: AC
  Filled 2020-06-05: qty 1440

## 2020-06-05 NOTE — Progress Notes (Signed)
Referring Physician(s): Dr Rande Brunt  Supervising Physician: Corrie Mckusick  Patient Status:  [facility] - In-pt  Chief Complaint:  "pills coming out into GB drain"  Subjective:  Pt has complex postoperative course after subtotal cholecystectomy for acute gangrenous cholecystitis in May 6144 complicated by biloma formation requiring percutaneous drainage on 02/13/2020 and placement of metallic common bile duct stent on 04/26/2020.  9/21: Cholangiogram via indwelling gallbladder fossa drain  Percutaneous cholangiogram  Placement of right-sided internal external biliary drain  Drains flush easily GB drain OP is yellow/cloudy PTC Bili drain OP is bile like Both drains have great OP  CT 9/23 IMPRESSION: 1. Again noted are findings of acute pancreatitis with relatively stable peripancreatic fluid collections. 2. Findings consistent with a gastric ileus. The esophagus is dilated and fluid-filled to at least the mid thorax. This patient would likely benefit from NG tube decompression. 3. Worsening airspace opacities at the lung bases concerning for developing infiltrates or aspiration. 4. Moderate-sized left-sided pleural effusion with at least partial collapse of the left lower lobe. There is a trace to small right-sided pleural effusion with mild adjacent atelectasis. 5. Stable small volume abdominal ascites. 6. Well-positioned internal/external biliary drain. Unchanged cholecystostomy tube.  Asked to evaluate drains RN reports pills are seen in GB drain since yesterday  Allergies: Toradol [ketorolac tromethamine]  Medications: Prior to Admission medications   Medication Sig Start Date End Date Taking? Authorizing Provider  Calcium Carb-Cholecalciferol (CALCIUM+D3 PO) Take 1 tablet by mouth daily.   Yes [provider]  Coenzyme Q10 (COQ10) 400 MG CAPS Take 800 mg by mouth in the morning.    Yes [provider]  Cyanocobalamin (VITAMIN B-12) 5000 MCG  TBDP Take 5,000 mcg by mouth daily.   Yes [provider]  GLUCOSAMINE-CHONDROITIN PO Take 1 tablet by mouth in the morning and at bedtime.    Yes [provider]  Javier Docker Oil 1000 MG CAPS Take 1,000 mg by mouth in the morning and at bedtime.    Yes [provider]  lisinopril-hydrochlorothiazide (ZESTORETIC) 10-12.5 MG tablet Take 1 tablet by mouth daily. 03/04/20  Yes [provider]  Moringa Oleifera (MORINGA PO) Take 1,000 mg by mouth 2 (two) times daily.   Yes [provider]  Multiple Vitamin (MULTIVITAMIN WITH MINERALS) TABS tablet Take 1 tablet by mouth daily.   Yes [provider]  ondansetron (ZOFRAN) 4 MG tablet Take 1 tablet (4 mg total) by mouth every 8 (eight) hours as needed for nausea or vomiting. 04/25/20  Yes Mansouraty, Telford Nab., MD  OVER THE COUNTER MEDICATION Take 1 tablet by mouth See admin instructions. [facility] otc tablet- Take 1 tablet by mouth once a day   Yes [provider]  oxyCODONE (OXY IR/ROXICODONE) 5 MG immediate release tablet Take 5 mg by mouth 2 (two) times daily as needed (for pain).  04/25/20  Yes [provider]  pravastatin (PRAVACHOL) 80 MG tablet Take 40 mg by mouth in the morning and at bedtime.   Yes [provider]  Red Yeast Rice 600 MG CAPS Take 1,200 mg by mouth at bedtime.    Yes [provider]  sertraline (ZOLOFT) 100 MG tablet Take 100 mg by mouth daily. 11/08/19  Yes [provider]  TURMERIC PO Take 1,500 mg by mouth 2 (two) times daily.   Yes [provider]  zolpidem (AMBIEN) 5 MG tablet Take 5 mg by mouth at bedtime. 12/29/19  Yes [provider]  ibuprofen (ADVIL) 200  MG tablet Take 400-600 mg by mouth every 6 (six) hours as needed for headache or moderate pain.    [provider]     Vital Signs: BP 130/78   Pulse 94   Temp 98.4 F (36.9 C)   Resp 18   Ht 5' 9"  (1.753 m)   Wt 195 lb 15.8 oz (88.9 kg)   SpO2  95%   BMI 28.94 kg/m   Physical Exam Skin:    General: Skin is warm.     Comments: Both drains intact Flush easily GB drain OP cloudy yellow-- 400 cc yesterday PTC/Biliary drain OP bile-- 400 cc yesterday Sites are clean and dry  Small remnants of what appears to be crushed up pills in GB drain      Imaging: DG Abd Portable 1V  Result Date: 06/05/2020 CLINICAL DATA:  NG tube advancement EXAM: PORTABLE ABDOMEN - 1 VIEW COMPARISON:  06/05/2020 FINDINGS: Enteric tube tip is advanced to the upper mid abdomen consistent with location in the distal stomach. Examination is otherwise unchanged. IMPRESSION: Enteric tube tip is in the distal stomach. Electronically Signed   By: Lucienne Capers M.D.   On: 06/05/2020 06:10   DG Abd Portable 1V  Result Date: 06/05/2020 CLINICAL DATA:  NG tube placement EXAM: PORTABLE ABDOMEN - 1 VIEW COMPARISON:  06/03/2020 FINDINGS: A catheter tip is visualized in the upper mediastinum, about 3.3 cm above the carina. It is unclear whether this is a normally placed endotracheal tube or a short enteric tube. Right PICC line with tip over the cavoatrial junction region. Biliary wall stent with apparent percutaneous biliary drain. Pigtail catheter also in the right upper quadrant, possibly cholecystostomy. Paucity of gas in the upper abdomen. IMPRESSION: Catheter tip is in the upper mediastinum, about 3.3 cm above the carina. It is unclear whether this is a normally placed endotracheal tube or a short enteric tube. Electronically Signed   By: Lucienne Capers M.D.   On: 06/05/2020 05:44   DG Abd Portable 1V  Result Date: 06/04/2020 CLINICAL DATA:  OG tube placement EXAM: PORTABLE ABDOMEN - 1 VIEW COMPARISON:  Radiograph 06/02/2020, CT 05/31/2020 FINDINGS: Tip of the transesophageal tube terminating in the left upper quadrant with the side port beyond the GE junction. Biliary stent is in place traversed by an internal-external biliary drain. Terminus of a pigtail drain  is noted as well in the right upper quadrant. Telemetry leads overlie the abdomen. Left basilar effusion. Atelectatic changes in the right lung base. No high-grade obstructive bowel gas pattern. Diffuse mild body wall edema is noted. Suspect ascites as well. Osseous structures are free of acute abnormality with mild degenerative changes in the spine, hips and pelvis. IMPRESSION: 1. Tip of the transesophageal tube terminating in the left upper quadrant in the vicinity of the gastric body with the side port beyond the GE junction. 2. Biliary stent and internal-external biliary drain in place. 3. Suspect ascites and diffuse body wall edema. Electronically Signed   By: Lovena Le M.D.   On: 06/04/2020 00:14   DG Abd Portable 1V  Result Date: 06/02/2020 CLINICAL DATA:  Initial evaluation for NG tube placement. EXAM: PORTABLE ABDOMEN - 1 VIEW COMPARISON:  Prior radiograph from 06/01/2020. FINDINGS: Enteric tube in place with tip seen overlying the gastric body, side hole beyond the GE junction. Visualized bowel gas pattern within normal limits. Pigtail drainage catheter overlies the right upper quadrant. Additional surgical drain and biliary stent remains in place. Persistent dense opacity at the left  lung base, which could reflect a combination of effusion with atelectasis and/or consolidation. IMPRESSION: 1. Enteric tube in place with tip overlying the gastric body, side hole beyond the GE junction. 2. Persistent dense left basilar opacity, which could reflect a combination of effusion with atelectasis and/or consolidation. Electronically Signed   By: Jeannine Boga M.D.   On: 06/02/2020 03:50   DG Abd Portable 1V  Result Date: 06/01/2020 CLINICAL DATA:  Nasogastric tube placement. EXAM: PORTABLE ABDOMEN - 1 VIEW COMPARISON:  05/27/2020 FINDINGS: Nasogastric tube has been advanced, tip overlying the level of the distal stomach. Surgical drains and biliary stent in place. Bowel gas pattern is  nonobstructive. There is residual contrast in the colon. There is opacity at the LEFT lung base consistent with consolidation and pleural effusion. IMPRESSION: Nasogastric tube tip to the level of the distal stomach. Electronically Signed   By: Nolon Nations M.D.   On: 06/01/2020 14:24    Labs:  CBC: Recent Labs    06/01/20 0809 06/02/20 1026 06/04/20 0538 06/05/20 0403  WBC 42.2* 18.5* 19.0* 18.2*  HGB 7.8* 7.7* 8.5* 8.5*  HCT 27.9* 26.8* 29.9* 28.5*  PLT 296 250 258 267    COAGS: Recent Labs    02/02/20 2115 05/16/20 1905 05/29/20 0404 05/31/20 1415  INR 1.1 1.3* 1.4* 1.5*    BMP: Recent Labs    06/02/20 0550 06/03/20 0528 06/04/20 0538 06/05/20 0403  NA 146* 143 141 140  K 3.7 3.3* 3.6 3.7  CL 116* 116* 112* 112*  CO2 21* 21* 21* 20*  GLUCOSE 228* 159* 123* 179*  BUN 48* 40* 38* 34*  CALCIUM 8.4* 8.3* 8.5* 8.4*  CREATININE 0.90 0.70 0.77 0.73  GFRNONAA >60 >60 >60 >60  GFRAA >60 >60 >60 >60    LIVER FUNCTION TESTS: Recent Labs    06/01/20 0856 06/02/20 0550 06/04/20 0538 06/05/20 0403  BILITOT 1.2 1.1 0.9 0.9  AST 48* 27 31 27   ALT 39 30 31 27   ALKPHOS 318* 246* 257* 210*  PROT 6.0* 5.6* 5.7* 5.6*  ALBUMIN 1.7* 1.5* 1.4* 1.8*    Assessment and Plan:  Dr Earleen Newport reviewed imaging of 9/23 Drains are in good position Seem to be functioning well Bili drain does go to gut-- possibly could be refluxing remnants from gut to communication to GB drain No other recommendation from IR at this time No imaging required. Please call Dr Earleen Newport at [phone] today if further concerns  Electronically Signed: Lavonia Drafts, PA-C 06/05/2020, 11:29 AM   I spent a total of 25 Minutes at the the patient's bedside AND on the patient's hospital floor or unit, greater than 50% of which was counseling/coordinating care for GB and Bili drains

## 2020-06-05 NOTE — Progress Notes (Signed)
[facility] for Infectious Disease  Date of Admission:  04/26/2020     Total days of antibiotics 6         ASSESSMENT:  Mr. Fawcett blood cultures from 9/25 have remained without growth to date. Biliary drain remains in place with plans for drain placement check. Has episodes of anxiety at night and removed his NG tube. Continue Unasyn. Monitor cultures for clearance of bacteremia. TEE pending when he is more stable.   PLAN:  1. Continue Unasyn 2. Drain management per IR and gastroenterology.  3. Anxiety/agitation management per CCM.   Principal Problem:   Post-ERCP acute pancreatitis Active Problems:   Severe sepsis (HCC)   Polycythemia   AKI (acute kidney injury) ([place])   Metabolic acidosis   Acute respiratory failure with hypoxia (HCC)   Bilateral pleural effusion   Ileus (HCC)   Bile duct leak   Gastric outlet obstruction   Abdominal distention   ARDS (adult respiratory distress syndrome) ([place])   Biloma following surgery   Acute blood loss anemia   Palliative care encounter   Chest pain   . Chlorhexidine Gluconate Cloth  6 each Topical Daily  . enoxaparin (LOVENOX) injection  40 mg Subcutaneous Q24H  . influenza vac split quadrivalent PF  0.5 mL Intramuscular Tomorrow-1000  . insulin aspart  0-20 Units Subcutaneous Q4H  . mouth rinse  15 mL Mouth Rinse BID  . pantoprazole (PROTONIX) IV  40 mg Intravenous Q12H  . sodium chloride flush  10-40 mL Intracatheter Q12H  . sodium chloride flush  5 mL Intracatheter Q8H  . zolpidem  5 mg Per Tube QHS    SUBJECTIVE:  Afebrile overnight. Agitation per nursing note with need for soft restraints and replacement of NG tube as he pulled it out. Cultures remain without growth. Checking drain placement. Abdominal pain is not so bad today (has recently received pain medication per family at bedside).   Allergies  Allergen Reactions  . Toradol [Ketorolac Tromethamine] Other (See Comments)    delirium     Review of  Systems: Review of Systems  Constitutional: Negative for chills, fever and weight loss.  Respiratory: Negative for cough, shortness of breath and wheezing.   Cardiovascular: Negative for chest pain and leg swelling.  Gastrointestinal: Positive for abdominal pain. Negative for constipation, diarrhea, nausea and vomiting.  Skin: Negative for rash.      OBJECTIVE: Vitals:   06/05/20 0700 06/05/20 0800 06/05/20 0830 06/05/20 1020  BP: 136/86 127/77 130/78   Pulse: 95 93 94   Resp: (!) 22 (!) 22 18   Temp:  98.2 F (36.8 C)  98.4 F (36.9 C)  TempSrc:  Oral    SpO2: 96% 97% 95%   Weight:      Height:       Body mass index is 28.94 kg/m.  Physical Exam Constitutional:      General: He is not in acute distress.    Appearance: He is well-developed. He is ill-appearing.     Interventions: He is restrained.     Comments: Seated in bed; pleasant.   Cardiovascular:     Rate and Rhythm: Normal rate and regular rhythm.     Heart sounds: Normal heart sounds.  Pulmonary:     Effort: Pulmonary effort is normal.     Breath sounds: Normal breath sounds.  Abdominal:     Comments: Drains in place with biliary drainage present.   Skin:    General: Skin is warm and dry.  Neurological:     Mental Status: He is alert and oriented to person, place, and time.  Psychiatric:        Behavior: Behavior normal.        Thought Content: Thought content normal.        Judgment: Judgment normal.     Lab Results Lab Results  Component Value Date   WBC 18.2 (H) 06/05/2020   HGB 8.5 (L) 06/05/2020   HCT 28.5 (L) 06/05/2020   MCV 90.2 06/05/2020   PLT 267 06/05/2020    Lab Results  Component Value Date   CREATININE 0.73 06/05/2020   BUN 34 (H) 06/05/2020   NA 140 06/05/2020   K 3.7 06/05/2020   CL 112 (H) 06/05/2020   CO2 20 (L) 06/05/2020    Lab Results  Component Value Date   ALT 27 06/05/2020   AST 27 06/05/2020   ALKPHOS 210 (H) 06/05/2020   BILITOT 0.9 06/05/2020      Microbiology: Recent Results (from the past 240 hour(s))  Culture, blood (Routine X 2) w Reflex to ID Panel     Status: Abnormal   Collection Time: 05/31/20  6:34 PM   Specimen: BLOOD LEFT HAND  Result Value Ref Range Status   Specimen Description BLOOD LEFT HAND  Final   Special Requests   Final    BOTTLES DRAWN AEROBIC ONLY Blood Culture results may not be optimal due to an inadequate volume of blood received in culture bottles   Culture  Setup Time   Final    GRAM NEGATIVE RODS AEROBIC BOTTLE ONLY CRITICAL VALUE NOTED.  VALUE IS CONSISTENT WITH PREVIOUSLY REPORTED AND CALLED VALUE.    Culture (A)  Final    KLEBSIELLA PNEUMONIAE SUSCEPTIBILITIES PERFORMED ON PREVIOUS CULTURE WITHIN THE LAST 5 DAYS. Performed at [facility], [place] [address] [place], [place] [address]    Report Status 06/04/2020 FINAL  Final  Culture, blood (Routine X 2) w Reflex to ID Panel     Status: Abnormal   Collection Time: 05/31/20  6:37 PM   Specimen: BLOOD  Result Value Ref Range Status   Specimen Description BLOOD LEFT ANTECUBITAL  Final   Special Requests   Final    BOTTLES DRAWN AEROBIC AND ANAEROBIC Blood Culture results may not be optimal due to an inadequate volume of blood received in culture bottles   Culture  Setup Time   Final    GRAM POSITIVE COCCI GRAM NEGATIVE RODS AEROBIC BOTTLE ONLY CRITICAL RESULT CALLED TO, READ BACK BY AND VERIFIED WITH: J [facility] PHARMD 06/02/20 0209 JDW GRAM NEGATIVE RODS ANAEROBIC BOTTLE ONLY Performed at [facility], [place] [address], [place], [place] [address]    Culture ENTEROCOCCUS FAECALIS KLEBSIELLA PNEUMONIAE  (A)  Final   Report Status 06/04/2020 FINAL  Final   Organism ID, Bacteria ENTEROCOCCUS FAECALIS  Final   Organism ID, Bacteria KLEBSIELLA PNEUMONIAE  Final      Susceptibility   Enterococcus faecalis - MIC*    AMPICILLIN <=2 SENSITIVE Sensitive     VANCOMYCIN 1 SENSITIVE Sensitive     GENTAMICIN SYNERGY SENSITIVE Sensitive     *  ENTEROCOCCUS FAECALIS   Klebsiella pneumoniae - MIC*    AMPICILLIN >=32 RESISTANT Resistant     CEFAZOLIN <=4 SENSITIVE Sensitive     CEFEPIME <=0.12 SENSITIVE Sensitive     CEFTAZIDIME <=1 SENSITIVE Sensitive     CEFTRIAXONE <=0.25 SENSITIVE Sensitive     CIPROFLOXACIN <=0.25 SENSITIVE Sensitive  GENTAMICIN <=1 SENSITIVE Sensitive     IMIPENEM <=0.25 SENSITIVE Sensitive     TRIMETH/SULFA <=20 SENSITIVE Sensitive     AMPICILLIN/SULBACTAM 8 SENSITIVE Sensitive     PIP/TAZO <=4 SENSITIVE Sensitive     * KLEBSIELLA PNEUMONIAE  Blood Culture ID Panel (Reflexed)     Status: Abnormal   Collection Time: 05/31/20  6:37 PM  Result Value Ref Range Status   Enterococcus faecalis DETECTED (A) NOT DETECTED Final    Comment: CRITICAL RESULT CALLED TO, READ BACK BY AND VERIFIED WITH: J [facility] 06/02/20 0209 JDW    Enterococcus Faecium NOT DETECTED NOT DETECTED Final   Listeria monocytogenes NOT DETECTED NOT DETECTED Final   Staphylococcus species NOT DETECTED NOT DETECTED Final   Staphylococcus aureus (BCID) NOT DETECTED NOT DETECTED Final   Staphylococcus epidermidis NOT DETECTED NOT DETECTED Final   Staphylococcus lugdunensis NOT DETECTED NOT DETECTED Final   Streptococcus species NOT DETECTED NOT DETECTED Final   Streptococcus agalactiae NOT DETECTED NOT DETECTED Final   Streptococcus pneumoniae NOT DETECTED NOT DETECTED Final   Streptococcus pyogenes NOT DETECTED NOT DETECTED Final   A.calcoaceticus-baumannii NOT DETECTED NOT DETECTED Final   Bacteroides fragilis NOT DETECTED NOT DETECTED Final   Enterobacterales DETECTED (A) NOT DETECTED Final    Comment: Enterobacterales represent a large order of gram negative bacteria, not a single organism. CRITICAL RESULT CALLED TO, READ BACK BY AND VERIFIED WITH: J [facility] 06/02/20 0209 JDW    Enterobacter cloacae complex NOT DETECTED NOT DETECTED Final   Escherichia coli NOT DETECTED NOT DETECTED Final   Klebsiella aerogenes NOT  DETECTED NOT DETECTED Final   Klebsiella oxytoca NOT DETECTED NOT DETECTED Final   Klebsiella pneumoniae DETECTED (A) NOT DETECTED Final    Comment: CRITICAL RESULT CALLED TO, READ BACK BY AND VERIFIED WITH: J [facility] PHARMD 06/02/20 0209 JDW    Proteus species NOT DETECTED NOT DETECTED Final   Salmonella species NOT DETECTED NOT DETECTED Final   Serratia marcescens NOT DETECTED NOT DETECTED Final   Haemophilus influenzae NOT DETECTED NOT DETECTED Final   Neisseria meningitidis NOT DETECTED NOT DETECTED Final   Pseudomonas aeruginosa NOT DETECTED NOT DETECTED Final   Stenotrophomonas maltophilia NOT DETECTED NOT DETECTED Final   Candida albicans NOT DETECTED NOT DETECTED Final   Candida auris NOT DETECTED NOT DETECTED Final   Candida glabrata NOT DETECTED NOT DETECTED Final   Candida krusei NOT DETECTED NOT DETECTED Final   Candida parapsilosis NOT DETECTED NOT DETECTED Final   Candida tropicalis NOT DETECTED NOT DETECTED Final   Cryptococcus neoformans/gattii NOT DETECTED NOT DETECTED Final   CTX-M ESBL NOT DETECTED NOT DETECTED Final   Carbapenem resistance IMP NOT DETECTED NOT DETECTED Final   Carbapenem resistance KPC NOT DETECTED NOT DETECTED Final   Carbapenem resistance NDM NOT DETECTED NOT DETECTED Final   Carbapenem resist OXA 48 LIKE NOT DETECTED NOT DETECTED Final   Vancomycin resistance NOT DETECTED NOT DETECTED Final   Carbapenem resistance VIM NOT DETECTED NOT DETECTED Final    Comment: Performed at [facility], [address], [place], [place] [address]  Culture, blood (single)     Status: None (Preliminary result)   Collection Time: 06/02/20  9:18 AM   Specimen: BLOOD LEFT ARM  Result Value Ref Range Status   Specimen Description BLOOD LEFT ARM  Final   Special Requests   Final    BOTTLES DRAWN AEROBIC AND ANAEROBIC Blood Culture adequate volume   Culture   Final    NO  GROWTH 3 DAYS Performed at [facility], [place] [address] [place], [place]  [address]    Report Status PENDING  Incomplete  Culture, blood (single)     Status: None (Preliminary result)   Collection Time: 06/02/20 10:26 AM   Specimen: BLOOD  Result Value Ref Range Status   Specimen Description BLOOD SITE NOT SPECIFIED  Final   Special Requests   Final    BOTTLES DRAWN AEROBIC AND ANAEROBIC Blood Culture adequate volume   Culture   Final    NO GROWTH 3 DAYS Performed at [facility], [address], [place], [place] [address]    Report Status PENDING  Incomplete     Terri Piedra, [facility]ious [facility]  06/05/2020  11:21 AM

## 2020-06-05 NOTE — Progress Notes (Addendum)
Daily Progress Note   Patient Name: Adam Fowler       Date: 06/05/2020 DOB: 07/13/1956  Age: 64 y.o. MRN#: [id number] Attending Physician: Juanito Doom, MD Primary Care Physician: Melony Overly, MD Admit Date: 04/26/2020  Reason for Consultation/Follow-up: symptom management and to discuss complex medical decision making related to patient's goals of care  Discussed with ICU RN Jarrett Soho, Drs. McQuaid and [employer].   Patient suffered with agitation overnight.  He pulled out his N/G tube 2x and it was replaced.  He is now in mitts and soft wrist restraints.  Last documented stool was 9/17.  Able to walk 3 steps with PT yesterday.  Subjective: Talked with patient at bedside.  He is alert and orientated x 4.  He tells me he becomes panicked every once in a while.  He became panicked last night and pulled out the N/G.  He tells me he never wants to have the N/G put back in again.  (He has a small amount of dried blood on his nose).  He denies pain today and is not showing signs of pain.  Patient talked about difficultly breathing today and indeed he is mildly labored.   He states his difficulty breathing started last March when he had difficulty walking up the hill.  By April he had to ask his wife to push him up the hill.   SOB persists.   Assessment: Patient more interactive and alert.  Complains of difficulty breathing.   Patient Profile/HPI:  As of 9/26 - 64 y.o.malewith past medical history of HTN, insomnia, kidney stoneswho was admitted on 8/19/2021with post ERCP pancreatitis.He had developed gangrenous cholecystitis in May and underwent two ERCPs 5/15 and 5/17 (for stent placement). On 5/18 he had a subtotal cholecystectomy and placement of a JP drain. On 5/27 he was  noted to have a 8.6 cm fluid collection in the gall bladder fossa and on 6/7 this collection had developed air bubbles and a second collection had formed along the right liver edge. Previous drains were removed a a new drain was placed by IR. On 7/14 an ERCP was done to place a CBD stent. On 8/18 he underwent ERCP for FCSEMS placement for persistent bile leak. On 8/21 - 8/24 he decompensated with respiratory failure (bilateral pleural effusion), Afib with RVR and required  ventilation support for volume overload and aspiration pneumonia. On 9/9 an ERCP was attempted but unsuccessful due to functional gastric outlet obstruction and TPN was started on 9/10. On 9/23 he suffered acute decompensation with abdominal pain and hypotension (63/41, pulse 130s). Currently he remains in ICU. He has gradually become more debilitated and has suffered episodes of delirium. His albumin is currently 1.5.    Length of Stay: 39   Vital Signs: BP 130/78   Pulse 94   Temp 98.2 F (36.8 C) (Oral)   Resp 18   Ht 5\' 9"  (1.753 m)   Wt 88.9 kg   SpO2 95%   BMI 28.94 kg/m  SpO2: SpO2: 95 % O2 Device: O2 Device: Nasal Cannula O2 Flow Rate: O2 Flow Rate (L/min): 2 L/min       Palliative Assessment/Data: 40%     Palliative Care Plan    Recommendations/Plan:  Will continue fentanyl infusion at 12.5 mcg with PRN boluses.  Pain appears controlled today.  Will add low dose PRN ativan for panic or agitation (patient pulls out N/G when panicked)  Per GI and CCM Ok for ice chips as desired - N/G will remove fluid.  Suppository PRN constipation is ordered - am unsure of when his last bowel movement actually was.  Would encourage ambulation as tolerated or at least out of bed to chair.  Chaplain visit appreciated.  Please continue to round on him.  Updated [name].  Code Status:  Full code  Prognosis:   Unable to determine   Discharge Planning:  To Be Determined  Care plan was discussed with  Medical Team, patient and wife.  Thank you for allowing the Palliative Medicine Team to assist in the care of this patient.  Total time spent:  35 min.     Greater than 50%  of this time was spent counseling and coordinating care related to the above assessment and plan.  Florentina Jenny, PA-C Palliative Medicine  Please contact Palliative MedicineTeam phone at [phone] for questions and concerns between 7 am - 7 pm.   Please see AMION for individual provider pager numbers.

## 2020-06-05 NOTE — Progress Notes (Signed)
[facility] Progress Note Patient Name: Adam Fowler DOB: [birth year] MRN: [id number]   Date of Service  06/05/2020  HPI/Events of Note  Agitation - Patient pulled out NGT. Nursing request for bilateral soft wrist restraints and Abdominal film to confirm NGT placement.   eICU Interventions  Plan: 1. Bilateral soft wrist restraints X 1 hours.  2. Portable abdominal film to confirm NGT placement.      Intervention Category Major Interventions: Delirium, psychosis, severe agitation - evaluation and management;Other:  Lysle Dingwall 06/05/2020, 12:46 AM

## 2020-06-05 NOTE — Progress Notes (Signed)
[facility] Progress Note    Since last GI note: NG tube dislodged and then replaced again. He wants it out again but has wrist restraints.   Seems more clear mentally  Objective: Vital signs in last 24 hours: Temp:  [97.5 F (36.4 C)-99.4 F (37.4 C)] 98.2 F (36.8 C) (09/28 0800) Pulse Rate:  [84-113] 94 (09/28 0830) Resp:  [14-35] 18 (09/28 0830) BP: (98-149)/(56-90) 130/78 (09/28 0830) SpO2:  [92 %-98 %] 95 % (09/28 0830) Last BM Date: 05/25/20 Heart: regular rate and rythm Abdomen: soft, non-tender, non-distended, normal bowel sounds  Lab Results: Recent Labs    06/02/20 1026 06/04/20 0538 06/05/20 0403  WBC 18.5* 19.0* 18.2*  HGB 7.7* 8.5* 8.5*  PLT 250 258 267  MCV 93.7 91.7 90.2   Recent Labs    06/03/20 0528 06/04/20 0538 06/05/20 0403  NA 143 141 140  K 3.3* 3.6 3.7  CL 116* 112* 112*  CO2 21* 21* 20*  GLUCOSE 159* 123* 179*  BUN 40* 38* 34*  CREATININE 0.70 0.77 0.73  CALCIUM 8.3* 8.5* 8.4*   Recent Labs    06/04/20 0538 06/05/20 0403  PROT 5.7* 5.6*  ALBUMIN 1.4* 1.8*  AST 31 27  ALT 31 27  ALKPHOS 257* 210*  BILITOT 0.9 0.9   Medications: Scheduled Meds: . Chlorhexidine Gluconate Cloth  6 each Topical Daily  . enoxaparin (LOVENOX) injection  40 mg Subcutaneous Q24H  . influenza vac split quadrivalent PF  0.5 mL Intramuscular Tomorrow-1000  . insulin aspart  0-20 Units Subcutaneous Q4H  . mouth rinse  15 mL Mouth Rinse BID  . pantoprazole (PROTONIX) IV  40 mg Intravenous Q12H  . sodium chloride flush  10-40 mL Intracatheter Q12H  . sodium chloride flush  5 mL Intracatheter Q8H  . zolpidem  5 mg Per Tube QHS   Continuous Infusions: . sodium chloride Stopped (06/03/20 1628)  . albumin human Stopped (06/05/20 0157)  . ampicillin-sulbactam (UNASYN) IV 200 mL/hr at 06/05/20 0800  . fentaNYL infusion INTRAVENOUS 12.5 mcg/hr (06/05/20 0800)  . TPN ADULT (ION) 100 mL/hr at 06/05/20 0800   PRN Meds:.bisacodyl, fentaNYL,  ipratropium-albuterol, lidocaine, polyvinyl alcohol, promethazine, sodium chloride flush, sodium chloride flush   Assessment/Plan: 64 y.o. male with persistent bile leak following necrotic GB surgery. He has severe post ERCP pancreatitis with evolving large peripancreatic fluid collections, perhaps walled off necrosis causing GOO.  Biliary stent and one PTBD in place and one percutaneous drain that lays in his GB fossa site. RNs felt that when meds have been put into his NG that they are very quickly visible in the GB fossa site drain. That does not make sense to me and so I've discussed with CCM about having IR do a drain check on that drain.  Overall his is stable to improved from what I can tell.  His family has asked about transfer to tertiary facility and I certainly think that is reasonable knowing that transfers are extremely slow these days.  Continue TNA.  Continue NG decompression.  Milus Banister, MD  06/05/2020, 9:11 AM [facility] Pager [phone]

## 2020-06-05 NOTE — Progress Notes (Signed)
PHARMACY - TOTAL PARENTERAL NUTRITION CONSULT NOTE  Indication:  Severe pancreatitis, biliary fistula, GOO  Patient Measurements: Height: 5' 9"  (175.3 cm) Weight: 88.9 kg (195 lb 15.8 oz) IBW/kg (Calculated) : 70.7 TPN AdjBW (KG): 86.1 Body mass index is 28.94 kg/m.  Assessment:  30 YOM admitted 04/26/20, post-ERCP 8/18 and biliary stent exchange for persistent postoperative bile leak related to recent cholecystectomy with abscess s/p percutaneous drainage. Patient developed acute pancreatitis, has had a prolonged hospitalization complicated by VDRF (extubated 8/24), AKI, encephalopathy, and afib. TPN initiated 9/11 for acute post-ERCP pancreatitis/ileus and persistent biliary leak. Significant gastric fluid retention from functional outlet obstruction. Pt still with very poor PO intake.  9/23 - acutely decompensated requiring pressors and non-rebreather. Patient now in septic shock.  9/27 - Cx positive for klebsiella and enterococcus. Possible PICC exchange/holiday. Family denying palliative care, possible [facility] transfer.    Glucose / Insulin: A1c 5.6% - CBGs <180.  Required 17 units rSSI + 35 units in TPN.  Electrolytes: K 3.7 w KCl x4 runs(goal >/= 4 for ileus), Cl 112 high. CO2 20 low. (max acetate in TPN), CoCa down to 10.2 (none in TPN), Phos 2.7 low normal  Renal: SCr < 1, BUN down 34 LFTs / TGs: LFTs WNL except alk phos down to 210, tbili WNL, TG mildly elevated 180 Prealbumin / albumin: Prealbumin 8.2 (stable). Albumin 1.8 Intake / Output; MIVF: UOP 0.5 mL/kg/hr (up), NG down to 77m, drain up to 8052m net -461.58m62m LBM 9/20.    GI Imaging:  9/5: CT - severe pancreatitis 9/7: HIDA - persistent bile leak with accumulations/paracolic gutter 9/9: CT - pancreatitis (peripancreatic fat stranding and peripancreatic fluid, percutaneous drain at the gallbladder fossa, unchanged biliary stent with no intrahepatic biliary duct dilatation, bilateral pleural effusions 9/15: KUB - diffuse  colonic stool, no bowel obstruction/free air 9/18: CT - mild colonic diverticulosis, pseudocyst involvement and less likely abscess 9/23: KUB - no evidence of bowel obstruction/ileus 9/23: CT - Gastric ileus, dilated/fluid-filled esophagus, small volume abd ascites 9/26: KUB - Suspect ascites and diffuse body wall edema Surgeries / Procedures: 8/18: ERCP/biliary stent exchange 9/9: Unsuccessful ERCP 9/10: Paracentesis - 1.4L out 9/21: EGD- Esophagitis w/ active oozing and stigmata of recent bleeding, gastritis w/ hemorrhage, congested duodenal mucosa; NGT trauma  9/21: Paracentesis/biliary drain placement/cholangiogram/drain check- 3L removed  Central access: PICC placed 05/19/20 TPN start date: 05/19/20  Nutritional Goals (per RD rec on 05/18/20): kCal: 2300-2500, Protein: 130-145g, Fluid: >2.3L/day  Current Nutrition: TPN  Plan:  Continue TPN at goal rate of 100m74m, providing 144g AA, 336g CHO and 67g ILE for a total of 2390 kCal, meeting 100% of needs  Electrolytes in TPN: Na 30mE60m incr K 45mEq25mMag 4mEq/L84mhos 7mmol/L92montinue with no Ca, max acetate KCl x3 runs Add standard MVI and trace elements to TPN  Continue resistant SSI Q4H + 35 units regular insulin in TPN  F/u AM labs, PICC exchange/holiday, transfer to [facility]  SaAlexandria Candidate 06/05/2020, 7:01 AM

## 2020-06-05 NOTE — Progress Notes (Addendum)
Patient pulled out NG tube, again, even with green mittens on. E-Link RN, Elzie Rings, called at this time. Patient has pulled out multiple NG tubes since being in the ICU - this RN requested bilateral wrist restraints to help prevent this problem from happening again. NG tube was placed and air was auscultated in the stomach. This RN requested a chest x ray to verify placement since patient has a gastric ileus and the numerous attempts to re insert NG tube over the last couple of days has resulted in thick bloody liquid coming through tube. Patient placed back on low intermittent suction. Vitals stable. Airway intact. Will continue to monitor patient. Wife, Nassim Cosma, notified at this time.

## 2020-06-05 NOTE — Progress Notes (Addendum)
No food coloring seen in patient's drain at this time. Patient back on low intermittent suction. When suction was restarted blue liquid was drawn back up into cannister. Will continue to monitor.

## 2020-06-05 NOTE — Progress Notes (Signed)
NAME:  Adam Fowler, MRN:  [id number], DOB:  August 07, 1956, LOS: 83 ADMISSION DATE:  04/26/2020, CONSULTATION DATE:  9/23 REFERRING MD:  Wyline Copas, CHIEF COMPLAINT:  Hypotension   Brief History   64 yo M s/p subtotal cholecystectomy c/b persistent bile leak and biliary fistula, post ERCP necrotizing pancreatitis-- developed hypotension 9/23 and CCM consulted   Past Medical History  Kidney stones HTN Insomnia S/p cholecystectomy and abscess  Pancreatitis   Significant Hospital Events   8/20 admit 8/21-24 admitted 9/21 perc bilary drainage  9/23 drain capped. Acute decompensation in afternoon. Fluid resuscitation, starting on pressors and transferring to ICU  9/27 started fentanyl infusion, improvement in pain control  Consults:  PCCM GI CCS IR  Procedures:  9/3 GB fossa drain 9/21 cholangiogram and biliary drain placement  Significant Diagnostic Tests:  9/18 ct a/p w/con> moderate peripancreatic fluid extending into gastrohepatic ligament. Possible pseudocyst. Pigtail drainage catheter tip in region of gallbladder fossa. CBD stent. Persistent bibasilar opacities. L pleural effusion. Moderate ascites. Colonic diverticulosis  9/23 KUB>> cholecystostomy tube & internal/external biliary drain noted with a CBD stent in place, gastric tube within the stomach. Scattered large & small bowel gas pattern noted  9/23 ct a/p w con>>>again noted are findings of acute pancreatitis with relatively stable peripancreatic fluid collections.  Findings consistent with a gastric ileus. The esophagus is dilated and fluid-filled to at least the mid thorax. This patient would likely benefit from NG tube decompression. Worsening airspace opacities at the lung bases concerning for developing infiltrates or aspiration. Moderate-sized left-sided pleural effusion with at least partial collapse of the left lower lobe. There is a trace to small right-sided pleural effusion with mild adjacent atelectasis.  Stable  small volume abdominal ascites.  Well-positioned internal/external biliary drain. Unchanged cholecystostomy tube.   Micro Data:  8/19 SARS COV 2 > neg 8/19 blood > neg 9/10 body fluid culture > neg 9/23 blood > enterococcus faecalis, klebsiella 9/25 blood > NGTD  Antimicrobials:  9/21 zosyn x1 9/24 mero > 9/26 9/24 zosyn x1 9/24 vanc > 9/26 9/27 unasyn >   Interim history/subjective:   Pain control improved Wants to eat ice chips Pulled out his NG last night   Objective   Blood pressure 136/90, pulse (!) 104, temperature 99.1 F (37.3 C), temperature source Axillary, resp. rate (!) 27, height 5\' 9"  (1.753 m), weight 88.9 kg, SpO2 94 %.        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 Ask IR to assess GB fossa drain placement NPO TPN Restart home medications if no fistula on imaging.  Bacteremia in setting of peritonitis, enterococcal and klebsiella Septic shock > resolved Antibiotics per ID Will need TEE prior  to hospital discharge> too weak for it right now  Acute hypoxemic respiratory failure  > resolved Minimize sedation  Esophagitis BID ppi  Acute metabolic encephalopathy : narcotic related,  sepsis related Appreciate palliative medicine consult> continue fentanyl infusion with ET CO2 monitoring Low dose benzodiazepine acceptable for anxiety, but monitor carefully for confusion  Hypertension monitor  Best practice:  Diet: TPN Pain/Anxiety/Delirium protocol (if indicated): n/a VAP protocol (if indicated): n/a DVT prophylaxis: lovenox GI prophylaxis: BID protonix Glucose control: SSI Mobility: out of bed as able Code Status: full Family Communication: wife updated bedside 9/27, discussed with Dr. Ardis Hughs 9/28.  Will update wife when she arrives Disposition:   Labs   CBC: Recent Labs  Lab 05/31/20 1327 06/01/20 0809 06/02/20 1026 06/04/20 0538 06/05/20 0403  WBC 15.6* 42.2* 18.5* 19.0* 18.2*  NEUTROABS  --  39.2* 16.0* 17.3* 15.8*  HGB 8.6* 7.8* 7.7* 8.5* 8.5*  HCT 30.8* 27.9* 26.8* 29.9* 28.5*  MCV 96.3 95.9 93.7 91.7 90.2  PLT 290 296 250 258 174    Basic Metabolic Panel: Recent Labs  Lab 05/31/20 0458 05/31/20 1327 06/01/20 0516 06/01/20 0516 06/01/20 1143 06/02/20 0550 06/03/20 0528 06/04/20 0538 06/05/20 0403  NA 143   < > 145  --   --  146* 143 141 140  K 4.7   < > 5.3*   < > 4.6 3.7 3.3* 3.6 3.7  CL 111   < > 114*  --   --  116* 116* 112* 112*  CO2 22   < > 21*  --   --  21* 21* 21* 20*  GLUCOSE 206*   < > 209*  --   --  228* 159* 123* 179*  BUN 47*   < > 51*  --   --  48* 40* 38* 34*  CREATININE 1.05   < > 1.23  --   --  0.90 0.70 0.77 0.73  CALCIUM 8.6*   < > 8.2*  --   --  8.4* 8.3* 8.5* 8.4*  MG 2.4  --   --   --   --  2.3 2.0 2.1  --   PHOS 3.4  --   --   --   --  2.7 2.7 2.7  --    < > = values in this interval not displayed.   GFR: Estimated Creatinine Clearance: 102.9 mL/min (by C-G formula based on SCr of 0.73 mg/dL). Recent Labs  Lab 05/31/20 1327 05/31/20 1834 06/01/20 0809 06/01/20 0856 06/01/20 1157 06/02/20 0550 06/02/20 1026 06/03/20 0528 06/04/20 0538 06/05/20 0403  PROCALCITON  --   --  30.80  --   --  16.76  --   8.94  --   --   WBC   < >  --  42.2*  --   --   --  18.5*  --  19.0* 18.2*  LATICACIDVEN  --  5.3* 2.9* 3.5* 2.8*  --   --   --   --   --    < > = values in this interval not displayed.    Liver Function Tests: Recent Labs  Lab 05/31/20 1327 06/01/20 0856 06/02/20 0550 06/04/20 0538 06/05/20 0403  AST 32 48* 27 31 27   ALT 33 39 30 31 27   ALKPHOS 472* 318* 246* 257* 210*  BILITOT 2.4* 1.2 1.1 0.9 0.9  PROT 5.5* 6.0* 5.6* 5.7* 5.6*  ALBUMIN 1.7* 1.7* 1.5* 1.4* 1.8*   No results for input(s): LIPASE, AMYLASE in the  last 168 hours. No results for input(s): AMMONIA in the last 168 hours.  ABG    Component Value Date/Time   PHART 7.295 (L) 05/31/2020 1625   PCO2ART 38.6 05/31/2020 1625   PO2ART 62.0 (L) 05/31/2020 1625   HCO3 18.0 (L) 05/31/2020 1625   TCO2 26 04/30/2020 0241   ACIDBASEDEF 7.1 (H) 05/31/2020 1625   O2SAT 87.5 05/31/2020 1625     Coagulation Profile: Recent Labs  Lab 05/31/20 1415  INR 1.5*    Cardiac Enzymes: No results for input(s): CKTOTAL, CKMB, CKMBINDEX, TROPONINI in the last 168 hours.  HbA1C: Hgb A1c MFr Bld  Date/Time Value Ref Range Status  04/29/2020 02:02 AM 5.6 4.8 - 5.6 % Final    Comment:    (NOTE) Pre diabetes:          5.7%-6.4%  Diabetes:              >6.4%  Glycemic control for   <7.0% adults with diabetes   02/13/2020 04:55 PM 6.0 (H) 4.8 - 5.6 % Final    Comment:    (NOTE) Pre diabetes:          5.7%-6.4% Diabetes:              >6.4% Glycemic control for   <7.0% adults with diabetes     CBG: Recent Labs  Lab 06/04/20 1142 06/04/20 1545 06/04/20 1955 06/04/20 2346 06/05/20 0336  GLUCAP 120* 151* 135* 114* 168*     Critical care time: 32 minutes    Roselie Awkward, MD [facility] Pager: [phone] Cell: [phone] If no response, call [phone]

## 2020-06-05 NOTE — Progress Notes (Signed)
Subjective: Patient is more alert this morning, but was confused overnight and pulled out his NG tube (replaced). Gallbladder fossa drain remains serous. Hemodynamically stable, most recent blood cultures are negative.  Objective: Vital signs in last 24 hours: Temp:  [97.5 F (36.4 C)-99.4 F (37.4 C)] 98.2 F (36.8 C) (09/28 0800) Pulse Rate:  [84-113] 94 (09/28 0830) Resp:  [14-35] 18 (09/28 0830) BP: (98-149)/(56-90) 130/78 (09/28 0830) SpO2:  [92 %-98 %] 95 % (09/28 0830) Last BM Date: 05/25/20  Intake/Output from previous day: 09/27 0701 - 09/28 0700 In: 3061.8 [I.V.:2107.8; IV OQHUTMLYY:503] Out: 2600 [Urine:1100; Emesis/NG output:700; Drains:800] Intake/Output this shift: Total I/O In: 329.5 [I.V.:202.5; Other:20; IV Piggyback:107] Out: 400 [Urine:300; Drains:100]  PE: General: NAD, oriented to place HEENT: NG tube in place, draining gastric contents Resp: normal work of breathing, on nasal cannula  Abdomen: distended but soft, RUQ PBD with bilious fluid in bag. Percutaneous drain with serous fluid.  Lab Results:  Recent Labs    06/04/20 0538 06/05/20 0403  WBC 19.0* 18.2*  HGB 8.5* 8.5*  HCT 29.9* 28.5*  PLT 258 267   BMET Recent Labs    06/04/20 0538 06/05/20 0403  NA 141 140  K 3.6 3.7  CL 112* 112*  CO2 21* 20*  GLUCOSE 123* 179*  BUN 38* 34*  CREATININE 0.77 0.73  CALCIUM 8.5* 8.4*   PT/INR No results for input(s): LABPROT, INR in the last 72 hours. CMP     Component Value Date/Time   NA 140 06/05/2020 0403   K 3.7 06/05/2020 0403   CL 112 (H) 06/05/2020 0403   CO2 20 (L) 06/05/2020 0403   GLUCOSE 179 (H) 06/05/2020 0403   BUN 34 (H) 06/05/2020 0403   CREATININE 0.73 06/05/2020 0403   CALCIUM 8.4 (L) 06/05/2020 0403   PROT 5.6 (L) 06/05/2020 0403   ALBUMIN 1.8 (L) 06/05/2020 0403   AST 27 06/05/2020 0403   ALT 27 06/05/2020 0403   ALKPHOS 210 (H) 06/05/2020 0403   BILITOT 0.9 06/05/2020 0403   GFRNONAA >60 06/05/2020 0403    GFRAA >60 06/05/2020 0403   Lipase     Component Value Date/Time   LIPASE 27 05/16/2020 1905       Studies/Results: DG Abd Portable 1V  Result Date: 06/05/2020 CLINICAL DATA:  NG tube advancement EXAM: PORTABLE ABDOMEN - 1 VIEW COMPARISON:  06/05/2020 FINDINGS: Enteric tube tip is advanced to the upper mid abdomen consistent with location in the distal stomach. Examination is otherwise unchanged. IMPRESSION: Enteric tube tip is in the distal stomach. Electronically Signed   By: Lucienne Capers M.D.   On: 06/05/2020 06:10   DG Abd Portable 1V  Result Date: 06/05/2020 CLINICAL DATA:  NG tube placement EXAM: PORTABLE ABDOMEN - 1 VIEW COMPARISON:  06/03/2020 FINDINGS: A catheter tip is visualized in the upper mediastinum, about 3.3 cm above the carina. It is unclear whether this is a normally placed endotracheal tube or a short enteric tube. Right PICC line with tip over the cavoatrial junction region. Biliary wall stent with apparent percutaneous biliary drain. Pigtail catheter also in the right upper quadrant, possibly cholecystostomy. Paucity of gas in the upper abdomen. IMPRESSION: Catheter tip is in the upper mediastinum, about 3.3 cm above the carina. It is unclear whether this is a normally placed endotracheal tube or a short enteric tube. Electronically Signed   By: Lucienne Capers M.D.   On: 06/05/2020 05:44   DG Abd Portable 1V  Result Date: 06/04/2020  CLINICAL DATA:  OG tube placement EXAM: PORTABLE ABDOMEN - 1 VIEW COMPARISON:  Radiograph 06/02/2020, CT 05/31/2020 FINDINGS: Tip of the transesophageal tube terminating in the left upper quadrant with the side port beyond the GE junction. Biliary stent is in place traversed by an internal-external biliary drain. Terminus of a pigtail drain is noted as well in the right upper quadrant. Telemetry leads overlie the abdomen. Left basilar effusion. Atelectatic changes in the right lung base. No high-grade obstructive bowel gas pattern.  Diffuse mild body wall edema is noted. Suspect ascites as well. Osseous structures are free of acute abnormality with mild degenerative changes in the spine, hips and pelvis. IMPRESSION: 1. Tip of the transesophageal tube terminating in the left upper quadrant in the vicinity of the gastric body with the side port beyond the GE junction. 2. Biliary stent and internal-external biliary drain in place. 3. Suspect ascites and diffuse body wall edema. Electronically Signed   By: Lovena Le M.D.   On: 06/04/2020 00:14    Anti-infectives: Anti-infectives (From admission, onward)   Start     Dose/Rate Route Frequency Ordered Stop   06/04/20 1400  Ampicillin-Sulbactam (UNASYN) 3 g in sodium chloride 0.9 % 100 mL IVPB        3 g 200 mL/hr over 30 Minutes Intravenous Every 6 hours 06/04/20 0906     06/02/20 0300  meropenem (MERREM) 2 g in sodium chloride 0.9 % 100 mL IVPB  Status:  Discontinued        2 g 200 mL/hr over 30 Minutes Intravenous Every 8 hours 06/02/20 0248 06/04/20 0906   06/02/20 0300  vancomycin (VANCOCIN) IVPB 1000 mg/200 mL premix  Status:  Discontinued        1,000 mg 200 mL/hr over 60 Minutes Intravenous Every 12 hours 06/02/20 0248 06/04/20 0906   06/01/20 1000  piperacillin-tazobactam (ZOSYN) IVPB 3.375 g  Status:  Discontinued        3.375 g 12.5 mL/hr over 240 Minutes Intravenous Every 8 hours 06/01/20 0952 06/02/20 0248   05/29/20 0800  piperacillin-tazobactam (ZOSYN) IVPB 3.375 g        3.375 g 12.5 mL/hr over 240 Minutes Intravenous To Radiology 05/28/20 1328 05/29/20 1943   05/04/20 1230  meropenem (MERREM) 1 g in sodium chloride 0.9 % 100 mL IVPB  Status:  Discontinued        1 g 200 mL/hr over 30 Minutes Intravenous Every 8 hours 05/04/20 1156 05/19/20 0826   05/01/20 1400  piperacillin-tazobactam (ZOSYN) IVPB 3.375 g        3.375 g 12.5 mL/hr over 240 Minutes Intravenous Every 8 hours 05/01/20 1013 05/03/20 0907   04/29/20 2000  vancomycin (VANCOCIN) IVPB 1000 mg/200  mL premix  Status:  Discontinued        1,000 mg 200 mL/hr over 60 Minutes Intravenous Every 12 hours 04/29/20 0653 04/30/20 0831   04/29/20 0745  vancomycin (VANCOREADY) IVPB 1500 mg/300 mL        1,500 mg 150 mL/hr over 120 Minutes Intravenous  Once 04/29/20 0653 04/29/20 1129   04/26/20 2300  piperacillin-tazobactam (ZOSYN) IVPB 3.375 g        3.375 g 12.5 mL/hr over 240 Minutes Intravenous Every 8 hours 04/26/20 1716 05/01/20 0559   04/26/20 1700  piperacillin-tazobactam (ZOSYN) IVPB 3.375 g        3.375 g 100 mL/hr over 30 Minutes Intravenous  Once 04/26/20 1655 04/26/20 1844       Assessment/Plan 64 yo male s/p subtotal  cholecystectomy, complicated by persistent bile leak, 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 S/p PTBD placement by IR. - Keep biliary and gallbladder fossa drains to gravity.  - Antibiotics for bacteremia - Trend LFTs daily - Remain NPO with NG tube to suction. Minimize PO meds as patient will not absorb with gastric outlet obstruction, and NG clamping increases risk of aspiration. - Plan for repeat CT scan at the end of this week to assess peripancreatic fluid. Can discuss endoscopic drainage once necrosis matures, usually at about 6 weeks into course of pancreatitis. For now continue supportive care with TPN, antibiotics, physical therapy, etc. - Surgery will continue to follow   LOS: 39 days   Michaelle Birks, MD [facility] Surgery General, Hepatobiliary and Pancreatic Surgery 06/05/20  10:27 AM

## 2020-06-05 NOTE — Progress Notes (Signed)
Inpatient Rehab Admissions Coordinator:   At this time we are recommending CIR consult. I will request an order per our protocol.   Shann Medal, PT, DPT Admissions Coordinator [phone] 06/05/20  1:26 PM

## 2020-06-05 NOTE — Progress Notes (Signed)
[facility]    In discussion with GI/Radiology it seems the best approach to assess his drain is to inject food coloring into his gastric tube then look to see if it changes the characteristics of the GB fossa drain.  Wife confirms that he is not allergic to food coloring.  Will inject blue food coloring into gastric tube then monitor for change in color of GB fossa fluid color.  Will also test the pH of his GB fossa drain fluid.  Roselie Awkward, MD [place] [facility] Pager: [phone] Cell: [phone] If no response, call [phone]

## 2020-06-06 ENCOUNTER — Inpatient Hospital Stay (HOSPITAL_COMMUNITY): Payer: 59

## 2020-06-06 LAB — GLUCOSE, CAPILLARY
Glucose-Capillary: 120 mg/dL — ABNORMAL HIGH (ref 70–99)
Glucose-Capillary: 137 mg/dL — ABNORMAL HIGH (ref 70–99)
Glucose-Capillary: 138 mg/dL — ABNORMAL HIGH (ref 70–99)
Glucose-Capillary: 139 mg/dL — ABNORMAL HIGH (ref 70–99)
Glucose-Capillary: 141 mg/dL — ABNORMAL HIGH (ref 70–99)
Glucose-Capillary: 155 mg/dL — ABNORMAL HIGH (ref 70–99)

## 2020-06-06 LAB — COMPREHENSIVE METABOLIC PANEL
ALT: 23 U/L (ref 0–44)
AST: 22 U/L (ref 15–41)
Albumin: 2.1 g/dL — ABNORMAL LOW (ref 3.5–5.0)
Alkaline Phosphatase: 187 U/L — ABNORMAL HIGH (ref 38–126)
Anion gap: 8 (ref 5–15)
BUN: 29 mg/dL — ABNORMAL HIGH (ref 8–23)
CO2: 21 mmol/L — ABNORMAL LOW (ref 22–32)
Calcium: 8.6 mg/dL — ABNORMAL LOW (ref 8.9–10.3)
Chloride: 111 mmol/L (ref 98–111)
Creatinine, Ser: 0.69 mg/dL (ref 0.61–1.24)
GFR calc Af Amer: >60 mL/min (ref >60)
GFR calc non Af Amer: >60 mL/min (ref >60)
Glucose, Bld: 150 mg/dL — ABNORMAL HIGH (ref 70–99)
Potassium: 3.9 mmol/L (ref 3.5–5.1)
Sodium: 140 mmol/L (ref 135–145)
Total Bilirubin: 0.9 mg/dL (ref 0.3–1.2)
Total Protein: 5.8 g/dL — ABNORMAL LOW (ref 6.5–8.1)

## 2020-06-06 LAB — CBC WITH DIFFERENTIAL/PLATELET
Abs Immature Granulocytes: 0.79 10*3/uL — ABNORMAL HIGH (ref 0.00–0.07)
Basophils Absolute: 0.1 10*3/uL (ref 0.0–0.1)
Basophils Relative: 0 %
Eosinophils Absolute: 0.3 10*3/uL (ref 0.0–0.5)
Eosinophils Relative: 2 %
HCT: 27.6 % — ABNORMAL LOW (ref 39.0–52.0)
Hemoglobin: 8 g/dL — ABNORMAL LOW (ref 13.0–17.0)
Immature Granulocytes: 5 %
Lymphocytes Relative: 8 %
Lymphs Abs: 1.4 10*3/uL (ref 0.7–4.0)
MCH: 26.1 pg (ref 26.0–34.0)
MCHC: 29 g/dL — ABNORMAL LOW (ref 30.0–36.0)
MCV: 89.9 fL (ref 80.0–100.0)
Monocytes Absolute: 1.1 10*3/uL — ABNORMAL HIGH (ref 0.1–1.0)
Monocytes Relative: 6 %
Neutro Abs: 13.9 10*3/uL — ABNORMAL HIGH (ref 1.7–7.7)
Neutrophils Relative %: 79 %
Platelets: 258 10*3/uL (ref 150–400)
RBC: 3.07 MIL/uL — ABNORMAL LOW (ref 4.22–5.81)
RDW: 15.8 % — ABNORMAL HIGH (ref 11.5–15.5)
WBC: 17.7 10*3/uL — ABNORMAL HIGH (ref 4.0–10.5)
nRBC: 0.2 % (ref 0.0–0.2)

## 2020-06-06 MED ORDER — FENTANYL 12 MCG/HR TD PT72
1.0000 | MEDICATED_PATCH | TRANSDERMAL | Status: DC
  Administered 2020-06-06: 1 via TRANSDERMAL
  Filled 2020-06-06: qty 1

## 2020-06-06 MED ORDER — TAMSULOSIN HCL 0.4 MG PO CAPS
0.4000 mg | ORAL_CAPSULE | Freq: Every day | ORAL | Status: DC
  Administered 2020-06-07 – 2020-06-13 (×4): 0.4 mg via ORAL
  Filled 2020-06-06 (×4): qty 1

## 2020-06-06 MED ORDER — TRAVASOL 10 % IV SOLN
INTRAVENOUS | Status: AC
  Filled 2020-06-06: qty 1440

## 2020-06-06 MED ORDER — FENTANYL 2500MCG IN NS 250ML (10MCG/ML) PREMIX INFUSION: 12.5000 ug/h | INTRAVENOUS | Status: AC

## 2020-06-06 MED ORDER — FENTANYL CITRATE (PF) 100 MCG/2ML IJ SOLN: 12.5000 ug | INTRAMUSCULAR | Status: DC | PRN

## 2020-06-06 MED ORDER — LIDOCAINE 5 % EX PTCH
1.0000 | MEDICATED_PATCH | CUTANEOUS | Status: DC
  Administered 2020-06-06 – 2020-06-10 (×5): 1 via TRANSDERMAL
  Filled 2020-06-06 (×6): qty 1

## 2020-06-06 MED ORDER — SERTRALINE HCL 100 MG PO TABS
100.0000 mg | ORAL_TABLET | Freq: Every day | ORAL | Status: DC
  Administered 2020-06-06 – 2020-06-13 (×8): 100 mg
  Filled 2020-06-06 (×5): qty 2
  Filled 2020-06-06 (×2): qty 1
  Filled 2020-06-06: qty 2

## 2020-06-06 MED ORDER — PRAVASTATIN SODIUM 10 MG PO TABS
20.0000 mg | ORAL_TABLET | Freq: Every day | ORAL | Status: DC
  Administered 2020-06-06 – 2020-06-13 (×8): 20 mg
  Filled 2020-06-06 (×8): qty 2

## 2020-06-06 MED ORDER — FENTANYL 12 MCG/HR TD PT72
2.0000 | MEDICATED_PATCH | TRANSDERMAL | Status: AC
  Administered 2020-06-06: 1 via TRANSDERMAL
  Filled 2020-06-06 (×2): qty 1

## 2020-06-06 MED ORDER — POTASSIUM CHLORIDE 10 MEQ/50ML IV SOLN
10.0000 meq | INTRAVENOUS | Status: AC
  Administered 2020-06-06 (×2): 10 meq via INTRAVENOUS
  Filled 2020-06-06 (×2): qty 50

## 2020-06-06 MED ORDER — FENTANYL CITRATE (PF) 100 MCG/2ML IJ SOLN
25.0000 ug | INTRAMUSCULAR | Status: DC | PRN
  Administered 2020-06-07 – 2020-06-08 (×6): 50 ug via INTRAVENOUS
  Administered 2020-06-09: 25 ug via INTRAVENOUS
  Administered 2020-06-10 – 2020-06-11 (×3): 50 ug via INTRAVENOUS
  Administered 2020-06-11: 25 ug via INTRAVENOUS
  Administered 2020-06-11 – 2020-06-15 (×23): 50 ug via INTRAVENOUS
  Filled 2020-06-06 (×35): qty 2

## 2020-06-06 MED ORDER — FENTANYL 25 MCG/HR TD PT72
1.0000 | MEDICATED_PATCH | TRANSDERMAL | Status: DC
  Administered 2020-06-09 – 2020-06-15 (×3): 1 via TRANSDERMAL
  Filled 2020-06-06 (×4): qty 1

## 2020-06-06 MED ORDER — VITAL 1.5 CAL PO LIQD
1000.0000 mL | ORAL | Status: DC
  Administered 2020-06-06 – 2020-06-07 (×2): 1000 mL
  Filled 2020-06-06 (×2): qty 1000

## 2020-06-06 MED ORDER — VITAL HIGH PROTEIN PO LIQD: 1000.0000 mL | ORAL | Status: DC

## 2020-06-06 NOTE — Progress Notes (Addendum)
[facility] Progress Note  CC:  Post-ERCP pancreatitis  Subjective:  Complaining about the mitts and restraints that are in place because he keeps pulling out his tubes (NGT).  Gets confused overnight but more clear and appropriate during the day.    Objective:  Vital signs in last 24 hours: Temp:  [98.3 F (36.8 C)-100 F (37.8 C)] 98.3 F (36.8 C) (09/29 1218) Pulse Rate:  [89-116] 106 (09/29 1218) Resp:  [12-39] 25 (09/29 1218) BP: (121-178)/(75-110) 122/87 (09/29 1200) SpO2:  [89 %-100 %] 94 % (09/29 1218) Last BM Date: 06/06/20 General:  Alert, ill-appearing, in NAD Heart:  Slightly tachy; no murmurs Pulm:  CTAB.  No W/R/R. Abdomen:  Soft, non-distended.  BS present.  Some upper abdominal TTP.  Drains noted. Extremities:  Significant pitting edema noted in B/L LEs up to his thighs.  Lab Results: Recent Labs    06/04/20 0538 06/05/20 0403 06/06/20 0455  WBC 19.0* 18.2* 17.7*  HGB 8.5* 8.5* 8.0*  HCT 29.9* 28.5* 27.6*  PLT 258 267 258   BMET Recent Labs    06/04/20 0538 06/05/20 0403 06/06/20 0455  NA 141 140 140  K 3.6 3.7 3.9  CL 112* 112* 111  CO2 21* 20* 21*  GLUCOSE 123* 179* 150*  BUN 38* 34* 29*  CREATININE 0.77 0.73 0.69  CALCIUM 8.5* 8.4* 8.6*   LFT Recent Labs    06/06/20 0455  PROT 5.8*  ALBUMIN 2.1*  AST 22  ALT 23  ALKPHOS 187*  BILITOT 0.9   DG Abd Portable 1V  Result Date: 06/06/2020 CLINICAL DATA:  NG tube placement EXAM: PORTABLE ABDOMEN - 1 VIEW COMPARISON:  Radiograph 06/05/2020 FINDINGS: Transesophageal tube coiling in the left upper quadrant terminating in the region of the gastric body with side port distal to the GE junction. An internal/external biliary drain is again seen traversing a biliary stent as well as a pigtail percutaneous drain in the right upper quadrant. No high-grade obstructive bowel gas pattern. No suspicious calcifications within the abdomen. Persistent basilar atelectatic changes and small  left pleural effusion are noted. Degenerative changes in the spine, hips and pelvis. IMPRESSION: 1. Transesophageal tube coiling in the left upper quadrant, terminating in the gastric body with side port distal to the GE junction. Electronically Signed   By: Lovena Le M.D.   On: 06/06/2020 00:56   DG Abd Portable 1V  Result Date: 06/05/2020 CLINICAL DATA:  NG tube advancement EXAM: PORTABLE ABDOMEN - 1 VIEW COMPARISON:  06/05/2020 FINDINGS: Enteric tube tip is advanced to the upper mid abdomen consistent with location in the distal stomach. Examination is otherwise unchanged. IMPRESSION: Enteric tube tip is in the distal stomach. Electronically Signed   By: Lucienne Capers M.D.   On: 06/05/2020 06:10   DG Abd Portable 1V  Result Date: 06/05/2020 CLINICAL DATA:  NG tube placement EXAM: PORTABLE ABDOMEN - 1 VIEW COMPARISON:  06/03/2020 FINDINGS: A catheter tip is visualized in the upper mediastinum, about 3.3 cm above the carina. It is unclear whether this is a normally placed endotracheal tube or a short enteric tube. Right PICC line with tip over the cavoatrial junction region. Biliary wall stent with apparent percutaneous biliary drain. Pigtail catheter also in the right upper quadrant, possibly cholecystostomy. Paucity of gas in the upper abdomen. IMPRESSION: Catheter tip is in the upper mediastinum, about 3.3 cm above the carina. It is unclear whether this is a normally placed endotracheal tube or a short enteric tube. Electronically  Signed   By: Lucienne Capers M.D.   On: 06/05/2020 05:44   Assessment / Plan: 64 y.o. male with persistent bile leak following necrotic GB surgery. He has severe post ERCP pancreatitis with evolving large peripancreatic fluid collections, perhaps walled off necrosis causing GOO.  Biliary stent and one PTBD in place and one percutaneous drain that lays in his GB fossa site. RNs felt that when meds have been put into his NG that they are very quickly visible in the GB  fossa site drain.  Thought possible to be some retrograde reflux of material but drain check was ok.  Overall his is stable.  Continue TNA.  Cortrack has been placed so post-pyloric trickle tube feeds will be started soon.   LOS: 40 days   Laban Emperor. Zehr  06/06/2020, 12:30 PM   ________________________________________________________________________  Velora Heckler GI MD note:  I personally examined the patient, reviewed the data and agree with the assessment and plan described above. Continue TNA for now but if he will tolerate tube feeds delivered deeply into his small bowel (deep duodenum or proximal jejunum) that will probably allow better nutrition.  Agree with repeat CT on Friday to assess the fluid collections, they may be maturing to the point of safe attempt endoscopic cyst-gastrostomy   Owens Loffler, MD [facility] Pager [phone]

## 2020-06-06 NOTE — Progress Notes (Signed)
[facility]  Date of Admission:  04/26/2020     Total days of antibiotics 7         ASSESSMENT:  Adam Fowler blood cultures remain without growth to date. Has been afebrile. No leak in previous drain and with good placement. Plans for re-imaging for necrotizing pancreatitis on Friday per surgery. Will continue to need antibiotics for complicated bacteremia. Duration will also be dependent upon source control. Continue current dose of Unasyn.   PLAN:  1. Continue current dose of Unasyn. 2. Monitor cultures for clearance of bacteremia.  3. Re-imaging planned for Friday.  4. Drain care per IR and primary team.    Principal Problem:   Post-ERCP acute pancreatitis Active Problems:   Severe sepsis (HCC)   Polycythemia   AKI (acute kidney injury) ([facility])   Metabolic acidosis   Acute respiratory failure with hypoxia (HCC)   Bilateral pleural effusion   Ileus (HCC)   Bile duct leak   Gastric outlet obstruction   Abdominal distention   ARDS (adult respiratory distress syndrome) (HCC)   Biloma following surgery   Acute blood loss anemia   Palliative care encounter   Chest pain    Chlorhexidine Gluconate Cloth  6 each Topical Daily   enoxaparin (LOVENOX) injection  40 mg Subcutaneous Q24H   influenza vac split quadrivalent PF  0.5 mL Intramuscular Tomorrow-1000   insulin aspart  0-20 Units Subcutaneous Q4H   mouth rinse  15 mL Mouth Rinse BID   pantoprazole (PROTONIX) IV  40 mg Intravenous Q12H   pravastatin  20 mg Per Tube q1800   sertraline  100 mg Per Tube Daily   sodium chloride flush  10-40 mL Intracatheter Q12H   sodium chloride flush  5 mL Intracatheter Q8H   zolpidem  5 mg Per Tube QHS    SUBJECTIVE:  Afebrile overnight with agitation and once again pulled out NG tube and then replaced. Blood cultures remain without growth today. Stomach pain is okay. Just had Coretrak placed.   Allergies  Allergen Reactions   Toradol  [Ketorolac Tromethamine] Other (See Comments)    delirium     Review of Systems: Review of Systems  Constitutional: Negative for chills, fever and weight loss.  Respiratory: Negative for cough, shortness of breath and wheezing.   Cardiovascular: Negative for chest pain and leg swelling.  Gastrointestinal: Negative for abdominal pain, constipation, diarrhea, nausea and vomiting.  Skin: Negative for rash.      OBJECTIVE: Vitals:   06/06/20 0500 06/06/20 0600 06/06/20 0700 06/06/20 0800  BP: (!) 178/89 130/82 (!) 135/96 (!) 136/92  Pulse: (!) 111 98 89 97  Resp: (!) 25 (!) 25 12 (!) 23  Temp:   100 F (37.8 C)   TempSrc:      SpO2: 98% 100% 100% 100%  Weight:      Height:       Body mass index is 28.94 kg/m.  Physical Exam Constitutional:      General: He is not in acute distress.    Appearance: He is well-developed.     Comments: Lying in bed with head of bed elevated; drowsy  HENT:     Nose:     Comments: Coretrak in place.  Cardiovascular:     Rate and Rhythm: Normal rate and regular rhythm.     Heart sounds: Normal heart sounds.  Pulmonary:     Effort: Pulmonary effort is normal.     Breath sounds: Normal breath sounds.  Abdominal:     Comments: Drains in place.   Skin:    General: Skin is warm and dry.  Neurological:     Mental Status: He is alert and oriented to person, place, and time.  Psychiatric:        Behavior: Behavior normal.        Thought Content: Thought content normal.        Judgment: Judgment normal.     Lab Results Lab Results  Component Value Date   WBC 17.7 (H) 06/06/2020   HGB 8.0 (L) 06/06/2020   HCT 27.6 (L) 06/06/2020   MCV 89.9 06/06/2020   PLT 258 06/06/2020    Lab Results  Component Value Date   CREATININE 0.69 06/06/2020   BUN 29 (H) 06/06/2020   NA 140 06/06/2020   K 3.9 06/06/2020   CL 111 06/06/2020   CO2 21 (L) 06/06/2020    Lab Results  Component Value Date   ALT 23 06/06/2020   AST 22 06/06/2020    ALKPHOS 187 (H) 06/06/2020   BILITOT 0.9 06/06/2020     Microbiology: Recent Results (from the past 240 hour(s))  Culture, blood (Routine X 2) w Reflex to ID Panel     Status: Abnormal   Collection Time: 05/31/20  6:34 PM   Specimen: BLOOD LEFT HAND  Result Value Ref Range Status   Specimen Description BLOOD LEFT HAND  Final   Special Requests   Final    BOTTLES DRAWN AEROBIC ONLY Blood Culture results may not be optimal due to an inadequate volume of blood received in culture bottles   Culture  Setup Time   Final    GRAM NEGATIVE RODS AEROBIC BOTTLE ONLY CRITICAL VALUE NOTED.  VALUE IS CONSISTENT WITH PREVIOUSLY REPORTED AND CALLED VALUE.    Culture (A)  Final    KLEBSIELLA PNEUMONIAE SUSCEPTIBILITIES PERFORMED ON PREVIOUS CULTURE WITHIN THE LAST 5 DAYS. Performed at [facility], [place] [address] [place], [place] [address]    Report Status 06/04/2020 FINAL  Final  Culture, blood (Routine X 2) w Reflex to ID Panel     Status: Abnormal   Collection Time: 05/31/20  6:37 PM   Specimen: BLOOD  Result Value Ref Range Status   Specimen Description BLOOD LEFT ANTECUBITAL  Final   Special Requests   Final    BOTTLES DRAWN AEROBIC AND ANAEROBIC Blood Culture results may not be optimal due to an inadequate volume of blood received in culture bottles   Culture  Setup Time   Final    GRAM POSITIVE COCCI GRAM NEGATIVE RODS AEROBIC BOTTLE ONLY CRITICAL RESULT CALLED TO, READ BACK BY AND VERIFIED WITH: J [facility] PHARMD 06/02/20 0209 JDW GRAM NEGATIVE RODS ANAEROBIC BOTTLE ONLY Performed at [facility], [place] [address] [place], [place] [address]    Culture ENTEROCOCCUS FAECALIS KLEBSIELLA PNEUMONIAE  (A)  Final   Report Status 06/04/2020 FINAL  Final   Organism ID, Bacteria ENTEROCOCCUS FAECALIS  Final   Organism ID, Bacteria KLEBSIELLA PNEUMONIAE  Final      Susceptibility   Enterococcus faecalis - MIC*    AMPICILLIN <=2 SENSITIVE Sensitive     VANCOMYCIN 1 SENSITIVE  Sensitive     GENTAMICIN SYNERGY SENSITIVE Sensitive     * ENTEROCOCCUS FAECALIS   Klebsiella pneumoniae - MIC*    AMPICILLIN >=32 RESISTANT Resistant     CEFAZOLIN <=4 SENSITIVE Sensitive     CEFEPIME <=0.12 SENSITIVE Sensitive     CEFTAZIDIME <=1 SENSITIVE  Sensitive     CEFTRIAXONE <=0.25 SENSITIVE Sensitive     CIPROFLOXACIN <=0.25 SENSITIVE Sensitive     GENTAMICIN <=1 SENSITIVE Sensitive     IMIPENEM <=0.25 SENSITIVE Sensitive     TRIMETH/SULFA <=20 SENSITIVE Sensitive     AMPICILLIN/SULBACTAM 8 SENSITIVE Sensitive     PIP/TAZO <=4 SENSITIVE Sensitive     * KLEBSIELLA PNEUMONIAE  Blood Culture ID Panel (Reflexed)     Status: Abnormal   Collection Time: 05/31/20  6:37 PM  Result Value Ref Range Status   Enterococcus faecalis DETECTED (A) NOT DETECTED Final    Comment: CRITICAL RESULT CALLED TO, READ BACK BY AND VERIFIED WITH: J LEDFORD [facility] 06/02/20 0209 JDW    Enterococcus Faecium NOT DETECTED NOT DETECTED Final   Listeria monocytogenes NOT DETECTED NOT DETECTED Final   Staphylococcus species NOT DETECTED NOT DETECTED Final   Staphylococcus aureus (BCID) NOT DETECTED NOT DETECTED Final   Staphylococcus epidermidis NOT DETECTED NOT DETECTED Final   Staphylococcus lugdunensis NOT DETECTED NOT DETECTED Final   Streptococcus species NOT DETECTED NOT DETECTED Final   Streptococcus agalactiae NOT DETECTED NOT DETECTED Final   Streptococcus pneumoniae NOT DETECTED NOT DETECTED Final   Streptococcus pyogenes NOT DETECTED NOT DETECTED Final   A.calcoaceticus-baumannii NOT DETECTED NOT DETECTED Final   Bacteroides fragilis NOT DETECTED NOT DETECTED Final   Enterobacterales DETECTED (A) NOT DETECTED Final    Comment: Enterobacterales represent a large order of gram negative bacteria, not a single organism. CRITICAL RESULT CALLED TO, READ BACK BY AND VERIFIED WITH: J LEDFORD [facility] 06/02/20 0209 JDW    Enterobacter cloacae complex NOT DETECTED NOT DETECTED Final   Escherichia coli  NOT DETECTED NOT DETECTED Final   Klebsiella aerogenes NOT DETECTED NOT DETECTED Final   Klebsiella oxytoca NOT DETECTED NOT DETECTED Final   Klebsiella pneumoniae DETECTED (A) NOT DETECTED Final    Comment: CRITICAL RESULT CALLED TO, READ BACK BY AND VERIFIED WITH: J LEDFORD PHARMD 06/02/20 0209 JDW    Proteus species NOT DETECTED NOT DETECTED Final   Salmonella species NOT DETECTED NOT DETECTED Final   Serratia marcescens NOT DETECTED NOT DETECTED Final   Haemophilus influenzae NOT DETECTED NOT DETECTED Final   Neisseria meningitidis NOT DETECTED NOT DETECTED Final   Pseudomonas aeruginosa NOT DETECTED NOT DETECTED Final   Stenotrophomonas maltophilia NOT DETECTED NOT DETECTED Final   Candida albicans NOT DETECTED NOT DETECTED Final   Candida auris NOT DETECTED NOT DETECTED Final   Candida glabrata NOT DETECTED NOT DETECTED Final   Candida krusei NOT DETECTED NOT DETECTED Final   Candida parapsilosis NOT DETECTED NOT DETECTED Final   Candida tropicalis NOT DETECTED NOT DETECTED Final   Cryptococcus neoformans/gattii NOT DETECTED NOT DETECTED Final   CTX-M ESBL NOT DETECTED NOT DETECTED Final   Carbapenem resistance IMP NOT DETECTED NOT DETECTED Final   Carbapenem resistance KPC NOT DETECTED NOT DETECTED Final   Carbapenem resistance NDM NOT DETECTED NOT DETECTED Final   Carbapenem resist OXA 48 LIKE NOT DETECTED NOT DETECTED Final   Vancomycin resistance NOT DETECTED NOT DETECTED Final   Carbapenem resistance VIM NOT DETECTED NOT DETECTED Final    Comment: Performed at [facility], [address] [place], [place] [address]  Culture, blood (single)     Status: None (Preliminary result)   Collection Time: 06/02/20  9:18 AM   Specimen: BLOOD LEFT ARM  Result Value Ref Range Status   Specimen Description BLOOD LEFT ARM  Final   Special Requests   Final  BOTTLES DRAWN AEROBIC AND ANAEROBIC Blood Culture adequate volume   Culture   Final    NO GROWTH 4 DAYS Performed at  [facility], [place] [address] [place], [place] [address]    Report Status PENDING  Incomplete  Culture, blood (single)     Status: None (Preliminary result)   Collection Time: 06/02/20 10:26 AM   Specimen: BLOOD  Result Value Ref Range Status   Specimen Description BLOOD SITE NOT SPECIFIED  Final   Special Requests   Final    BOTTLES DRAWN AEROBIC AND ANAEROBIC Blood Culture adequate volume   Culture   Final    NO GROWTH 4 DAYS Performed at [facility], [address], [place], [place] [address]    Report Status PENDING  Incomplete     Terri Piedra, NP [facility]  06/06/2020  10:58 AM

## 2020-06-06 NOTE — Progress Notes (Signed)
Subjective: Patient oriented this morning, but was confused overnight. Bilirubin remains normal. Most recent set of blood cultures remains negative. Had a bowel movement overnight.  Objective: Vital signs in last 24 hours: Temp:  [98.2 F (36.8 C)-100 F (37.8 C)] 100 F (37.8 C) (09/29 0700) Pulse Rate:  [89-116] 97 (09/29 0800) Resp:  [12-38] 23 (09/29 0800) BP: (121-178)/(76-110) 136/92 (09/29 0800) SpO2:  [92 %-100 %] 100 % (09/29 0800) Last BM Date: 06/06/20  Intake/Output from previous day: 09/28 0701 - 09/29 0700 In: 3250 [I.V.:2562.5; IV Piggyback:647.5] Out: 2610 [Urine:1650; Emesis/NG output:150; Drains:810] Intake/Output this shift: Total I/O In: 169 [I.V.:101.2; IV Piggyback:67.8] Out: 100 [Drains:100]  PE: General: NAD HEENT: NG tube in place, draining gastric contents Resp: normal work of breathing, on nasal cannula  Abdomen: distended but soft and mildly tender, RUQ PBD with bilious fluid in bag. Percutaneous gallbladder fossa drain with serous fluid. Extremities: bilateral lower extremity edema  Lab Results:  Recent Labs    06/05/20 0403 06/06/20 0455  WBC 18.2* 17.7*  HGB 8.5* 8.0*  HCT 28.5* 27.6*  PLT 267 258   BMET Recent Labs    06/05/20 0403 06/06/20 0455  NA 140 140  K 3.7 3.9  CL 112* 111  CO2 20* 21*  GLUCOSE 179* 150*  BUN 34* 29*  CREATININE 0.73 0.69  CALCIUM 8.4* 8.6*   PT/INR No results for input(s): LABPROT, INR in the last 72 hours. CMP     Component Value Date/Time   NA 140 06/06/2020 0455   K 3.9 06/06/2020 0455   CL 111 06/06/2020 0455   CO2 21 (L) 06/06/2020 0455   GLUCOSE 150 (H) 06/06/2020 0455   BUN 29 (H) 06/06/2020 0455   CREATININE 0.69 06/06/2020 0455   CALCIUM 8.6 (L) 06/06/2020 0455   PROT 5.8 (L) 06/06/2020 0455   ALBUMIN 2.1 (L) 06/06/2020 0455   AST 22 06/06/2020 0455   ALT 23 06/06/2020 0455   ALKPHOS 187 (H) 06/06/2020 0455   BILITOT 0.9 06/06/2020 0455   GFRNONAA >60 06/06/2020 0455    GFRAA >60 06/06/2020 0455   Lipase     Component Value Date/Time   LIPASE 27 05/16/2020 1905       Studies/Results: DG Abd Portable 1V  Result Date: 06/06/2020 CLINICAL DATA:  NG tube placement EXAM: PORTABLE ABDOMEN - 1 VIEW COMPARISON:  Radiograph 06/05/2020 FINDINGS: Transesophageal tube coiling in the left upper quadrant terminating in the region of the gastric body with side port distal to the GE junction. An internal/external biliary drain is again seen traversing a biliary stent as well as a pigtail percutaneous drain in the right upper quadrant. No high-grade obstructive bowel gas pattern. No suspicious calcifications within the abdomen. Persistent basilar atelectatic changes and small left pleural effusion are noted. Degenerative changes in the spine, hips and pelvis. IMPRESSION: 1. Transesophageal tube coiling in the left upper quadrant, terminating in the gastric body with side port distal to the GE junction. Electronically Signed   By: Lovena Le M.D.   On: 06/06/2020 00:56   DG Abd Portable 1V  Result Date: 06/05/2020 CLINICAL DATA:  NG tube advancement EXAM: PORTABLE ABDOMEN - 1 VIEW COMPARISON:  06/05/2020 FINDINGS: Enteric tube tip is advanced to the upper mid abdomen consistent with location in the distal stomach. Examination is otherwise unchanged. IMPRESSION: Enteric tube tip is in the distal stomach. Electronically Signed   By: Lucienne Capers M.D.   On: 06/05/2020 06:10   DG Abd Portable 1V  Result Date: 06/05/2020 CLINICAL DATA:  NG tube placement EXAM: PORTABLE ABDOMEN - 1 VIEW COMPARISON:  06/03/2020 FINDINGS: A catheter tip is visualized in the upper mediastinum, about 3.3 cm above the carina. It is unclear whether this is a normally placed endotracheal tube or a short enteric tube. Right PICC line with tip over the cavoatrial junction region. Biliary wall stent with apparent percutaneous biliary drain. Pigtail catheter also in the right upper quadrant, possibly  cholecystostomy. Paucity of gas in the upper abdomen. IMPRESSION: Catheter tip is in the upper mediastinum, about 3.3 cm above the carina. It is unclear whether this is a normally placed endotracheal tube or a short enteric tube. Electronically Signed   By: Lucienne Capers M.D.   On: 06/05/2020 05:44    Anti-infectives: Anti-infectives (From admission, onward)   Start     Dose/Rate Route Frequency Ordered Stop   06/04/20 1400  Ampicillin-Sulbactam (UNASYN) 3 g in sodium chloride 0.9 % 100 mL IVPB        3 g 200 mL/hr over 30 Minutes Intravenous Every 6 hours 06/04/20 0906     06/02/20 0300  meropenem (MERREM) 2 g in sodium chloride 0.9 % 100 mL IVPB  Status:  Discontinued        2 g 200 mL/hr over 30 Minutes Intravenous Every 8 hours 06/02/20 0248 06/04/20 0906   06/02/20 0300  vancomycin (VANCOCIN) IVPB 1000 mg/200 mL premix  Status:  Discontinued        1,000 mg 200 mL/hr over 60 Minutes Intravenous Every 12 hours 06/02/20 0248 06/04/20 0906   06/01/20 1000  piperacillin-tazobactam (ZOSYN) IVPB 3.375 g  Status:  Discontinued        3.375 g 12.5 mL/hr over 240 Minutes Intravenous Every 8 hours 06/01/20 0952 06/02/20 0248   05/29/20 0800  piperacillin-tazobactam (ZOSYN) IVPB 3.375 g        3.375 g 12.5 mL/hr over 240 Minutes Intravenous To Radiology 05/28/20 1328 05/29/20 1943   05/04/20 1230  meropenem (MERREM) 1 g in sodium chloride 0.9 % 100 mL IVPB  Status:  Discontinued        1 g 200 mL/hr over 30 Minutes Intravenous Every 8 hours 05/04/20 1156 05/19/20 0826   05/01/20 1400  piperacillin-tazobactam (ZOSYN) IVPB 3.375 g        3.375 g 12.5 mL/hr over 240 Minutes Intravenous Every 8 hours 05/01/20 1013 05/03/20 0907   04/29/20 2000  vancomycin (VANCOCIN) IVPB 1000 mg/200 mL premix  Status:  Discontinued        1,000 mg 200 mL/hr over 60 Minutes Intravenous Every 12 hours 04/29/20 0653 04/30/20 0831   04/29/20 0745  vancomycin (VANCOREADY) IVPB 1500 mg/300 mL        1,500 mg 150  mL/hr over 120 Minutes Intravenous  Once 04/29/20 0653 04/29/20 1129   04/26/20 2300  piperacillin-tazobactam (ZOSYN) IVPB 3.375 g        3.375 g 12.5 mL/hr over 240 Minutes Intravenous Every 8 hours 04/26/20 1716 05/01/20 0559   04/26/20 1700  piperacillin-tazobactam (ZOSYN) IVPB 3.375 g        3.375 g 100 mL/hr over 30 Minutes Intravenous  Once 04/26/20 1655 04/26/20 1844       Assessment/Plan 64 yo male s/p subtotal cholecystectomy, complicated by persistent bile leak, 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 S/p PTBD placement by IR. - Keep biliary and gallbladder fossa drains to gravity. At some point we will need to attempt to internalize the  PBD again, will discuss upsizing first with IR to prevent cholangitis as patient did not tolerate prior capping trial. - Antibiotics for bacteremia - Trend LFTs daily - Remain NPO with TPN. Now that patient is having bowel function, strongly recommend placement of an NJ feeding tube for enteral nutrition. Begin at trophic rate and do not advance. Keep NG tube in place to decompress the stomach. - Plan for repeat CT scan at the end of this week to assess peripancreatic fluid. Can discuss endoscopic drainage once necrosis matures, usually at about 6 weeks into course of pancreatitis. In the interim, the patient needs supportive care with nutritional optimization, control of biliary fistula, and physical therapy. Necrotizing pancreatitis typically causes prolonged illness with need for multiple procedures and prolonged rehabilitation. - Surgery will continue to follow closely   LOS: 40 days    Michaelle Birks, MD [facility] General, Hepatobiliary and Pancreatic Surgery 06/06/20  9:56 AM

## 2020-06-06 NOTE — Progress Notes (Signed)
PHARMACY - TOTAL PARENTERAL NUTRITION CONSULT NOTE  Indication:  Severe pancreatitis, biliary fistula, GOO  Patient Measurements: Height: 5' 9"  (175.3 cm) Weight: 88.9 kg (195 lb 15.8 oz) IBW/kg (Calculated) : 70.7 TPN AdjBW (KG): 86.1 Body mass index is 28.94 kg/m.  Assessment:  13 YOM admitted 04/26/20, post-ERCP 8/18 and biliary stent exchange for persistent postoperative bile leak related to recent cholecystectomy with abscess s/p percutaneous drainage. Patient developed acute pancreatitis, has had a prolonged hospitalization complicated by VDRF (extubated 8/24), AKI, encephalopathy, and afib. TPN initiated 9/11 for acute post-ERCP pancreatitis/ileus and persistent biliary leak. Significant gastric fluid retention from functional outlet obstruction. Pt still with very poor PO intake.  9/23 - acutely decompensated requiring pressors and non-rebreather. Patient now in septic shock.  9/27 - Cx positive for klebsiella and enterococcus. Possible PICC exchange/holiday. Family denying palliative care, possible [facility] transfer.    Glucose / Insulin: A1c 5.6% - CBGs <180.  Required 18 units rSSI + 35 units in TPN.  Electrolytes: K 3.9 w KCl x3 runs(goal >/= 4 for ileus), Cl 111 high-normal. CO2 21 low. (max acetate in TPN), CoCa down to 10.1 (none in TPN), Phos 2.7 low normal  Renal: SCr < 1, BUN down 34 LFTs / TGs: LFTs WNL except alk phos down to 210, tbili WNL, TG mildly elevated 180 Prealbumin / albumin: Prealbumin 8.2 (stable). Albumin 2.1 Intake / Output; MIVF: UOP 0.8 mL/kg/hr (up), NG down to 180m, drain up to 8115m net -2.9L (since 9/15).  LBM 9/20.    GI Imaging:  9/5: CT - severe pancreatitis 9/7: HIDA - persistent bile leak with accumulations/paracolic gutter 9/9: CT - pancreatitis (peripancreatic fat stranding and peripancreatic fluid, percutaneous drain at the gallbladder fossa, unchanged biliary stent with no intrahepatic biliary duct dilatation, bilateral pleural  effusions 9/15: KUB - diffuse colonic stool, no bowel obstruction/free air 9/18: CT - mild colonic diverticulosis, pseudocyst involvement and less likely abscess 9/23: KUB - no evidence of bowel obstruction/ileus 9/23: CT - Gastric ileus, dilated/fluid-filled esophagus, small volume abd ascites 9/26: KUB - Suspect ascites and diffuse body wall edema Surgeries / Procedures: 8/18: ERCP/biliary stent exchange 9/9: Unsuccessful ERCP 9/10: Paracentesis - 1.4L out 9/21: EGD- Esophagitis w/ active oozing and stigmata of recent bleeding, gastritis w/ hemorrhage, congested duodenal mucosa; NGT trauma  9/21: Paracentesis/biliary drain placement/cholangiogram/drain check- 3L removed  Central access: PICC placed 05/19/20 TPN start date: 05/19/20  Nutritional Goals (per RD rec on 05/18/20): kCal: 2300-2500, Protein: 130-145g, Fluid: >2.3L/day  Current Nutrition: TPN  Plan:  Continue TPN at goal rate of 10075mr, providing 144g AA, 336g CHO and 67g ILE for a total of 2390 kCal, meeting 100% of needs  Electrolytes in TPN: Na 15m51m, K 45mE89m Mag 4mEq/78mPhos 7mmol/55mincr Ca 3mEq/L,85mx acetate KCl x2 runs Add standard MVI and trace elements to TPN  Continue resistant SSI Q4H + 35 units regular insulin in TPN  F/u AM labs, possible transfer  Burnette Valenti RiRedgie Grayer Candidate 06/06/2020, 7:03 AM

## 2020-06-06 NOTE — Procedures (Signed)
Cortrak  Person Inserting Tube:  Jacklynn Barnacle E, RD Tube Type:  Cortrak - 43 inches Tube Location:  Left nare Initial Placement:  Stomach Secured by: Bridle Technique Used to Measure Tube Placement:  Documented cm marking at nare/ corner of mouth Cortrak Secured At:  100 cm    Cortrak Tube Team Note:  Consult received to place a Cortrak feeding tube.    X-ray is required, abdominal x-ray has been ordered by the Cortrak team. Please confirm tube placement before using the Cortrak tube.   If the tube becomes dislodged please keep the tube and contact the Cortrak team at [email or web address] ([email or web address]) for replacement.  If after hours and replacement cannot be delayed, place a NG tube and confirm placement with an abdominal x-ray.    Jacklynn Barnacle, MS, RD, LDN Pager number available on Amion

## 2020-06-06 NOTE — Progress Notes (Addendum)
Patient managed to pull out NG tube while in bilateral wrist restraints. NG tube has been replaced, no respiratory distress, and stomach contents are being pulled out of tube. E-link RN called at this time to request a abdominal x ray to verify placement of new NG tube. Wife updated at this time.

## 2020-06-06 NOTE — Progress Notes (Signed)
Nutrition Follow-up  DOCUMENTATION CODES:   Not applicable  INTERVENTION:   Continue TPN per Pharmacy; recommend continuing TPN at goal rate until pt demonstrating tolerance of gastric feedings and able to begin titration to goal rate. Should NOT include calories from TF when calculating TPN while TF at trickle rate as we are just trying to establish tolerance  Tube Feeding via Cortrak (post pyloric):  Initiate trickle TF Vital 1.5 at 20 ml/hr (no advancement at this time) Goal rate: Vital 1.5 of 65 ml/hr Pro-Source 45 mL TID Provides 138 g of protein, 2460 kcals and 1186 mL of free water   NUTRITION DIAGNOSIS:   Inadequate oral intake related to acute illness as evidenced by NPO status.  Being addressed via TF   GOAL:   Patient will meet greater than or equal to 90% of their needs  Progressing  MONITOR:   PO intake, Supplement acceptance, Diet advancement, Labs, Weight trends, Skin, I & O's  REASON FOR ASSESSMENT:   Ventilator    ASSESSMENT:   64 year old male with PMH of HTN, ERCP on 8/18 s/p biliary stent exchange for persistent postoperative bile leak after recent cholecystectomy with abscess s/- percutaneous drainage. CT abdomen/pelvis showing acute pancreatitis. Pt admitted with severe sepsis secondary to post-ERCP pancreatitis.   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; NGT placed for decompression 9/10- s/p paracentesis- 1.4 L fluid removed 9/11- PICC placed, TPN initiated 9/14- NGT removed, advanced to clear liquid diet 9/15- KUB revealed diffuse colonic stool, no FA, no obstruction; advanced to full liquid diet 9/16- transitioned to cyclic TPN 4/97- NGT reinserted for decompression 9/21- s/p upper GI endoscopy- revealed LA grade C/ grade D esophagitis with active oozing and stigmata of recent bleeding (stasis esophagitis and NGT likely etiology), gastritis with hemorrhage, congested duodenal mucosa in D1/D2; s/p Ultrasound guided paracentesis (3 L removed), biloma drain check, percutaneous cholangiogram, placement of biliary drain via right bile ducts; NGT removed 9/27 concern for pill fragments in GB fossa drain 9/28 Blue food coloring placed in NG tube, not see in GB fossa drain  TPN continues at goal rate of 100 ml/hr providing 144 g of protein and 2390 kcals.   Plan for Cortrak tube (post pyloric) today with initiation of trickle TF NG replaced over night after pt pulled out. Noted pt has removed multiple NG tubes this admission. Per MD, NG to remain out for now  +BM   Current wt 88.9 kg  Labs: CBGs 120-139, Creatinine wdl Meds: ss novolog, KCl   Diet Order:   Diet Order            Diet NPO time specified Except for: Ice Chips  Diet effective midnight                 EDUCATION NEEDS:   Education needs have been addressed  Skin:  Skin Assessment: Skin Integrity Issues: Skin Integrity Issues:: Incisions Incisions: closed abdomen  Last BM:  9/29  Height:   Ht  Readings from Last 1 Encounters:  05/02/20 5\' 9"  (1.753 m)    Weight:   Wt Readings from Last 1 Encounters:  06/04/20 88.9 kg    Ideal Body Weight:  72.7 kg  BMI:  Body mass index is 28.94 kg/m.  Estimated Nutritional Needs:   Kcal:  2300-2500  Protein:  130-145 grams  Fluid:  > 2.3 L    Kerman Passey MS, RDN, LDN, CNSC Registered Dietitian  III Clinical Nutrition RD Pager and On-Call Pager Number Located in [place]

## 2020-06-06 NOTE — Progress Notes (Addendum)
Palliative Care Progress Note  Followed up today on Adam Fowler pain and symptom management. He has been on a fentanyl infusion at 12.5 mcg/hr, with intermittent, but infrequent bolus doses of 61mcg as needed. He was resting peacefully when I walked in the room. He asked me if it was day or night. I provided reorientation. His abdomen remain tense and very tender to palpation. He rates his pain at a 3 at rest. Discussed his pain in detail w/ RN. He has severe pain with turning and moving. While I was in the room he also had an episode where he had sudden onset of pain when urinating into the condom catheter. His urine is "tea colored" but no evidence of pyuria or cloudiness. In general his WBC count trends down and he is on Unasyn.  1. Severe Abdominal Pain secondary to intra-abdominal abscess, pancreatitis, post-surgical/internalized drains and body wall edema.  Has been on Fentanyl infusion at 12.82mcg/hr for 24 hours, 1 additional dose of 51mcg administered based on record.  Based on the pain he is having with moving and additional doses given above hourly will place 25 mcg patch (2 -12.5 patches for now since 12.5 was just placed).  Will maintain an RN administered bolus 25-44mcg q2 as needed-strongly recommend pre-medicating prior to therapy or turning/bathing.  Discontinued albumin infusions for body wall edema-trickle feedings starting.  2. Painful Urination  Start Flomax for hesitancy and retention.  He is high risk for urinary retention due to increased pressure in his abdomen and with opioid dosing  Consider Foley if pain continues and UA- the decompression of his bladder may be beneficial for his pain.  Could also consider Pyridium as urinary anesthetic  Bladder scan for low Urine output or worsening agitation.  Time: 35 min Greater than 50%  of this time was spent counseling and coordinating care related to the above assessment and plan.  Lane Hacker, DO Palliative  Medicine

## 2020-06-06 NOTE — Progress Notes (Signed)
Occupational Therapy Treatment Patient Details Name: Adam Fowler MRN: [id number] DOB: 1955-10-31 Today's Date: 06/06/2020    History of present illness Pt is a 64 yo male who presents to the ED with severe abdominal pain, nausea and vomiting. Found to have post ERCP acute pancreatits. Pt developed Hypoxic resp failure and was Intubated 8/22  and extubated 8/24. Pt also with new onset a fib and acute ileus. PMH includes HTN and ERCP.    OT comments  Pt making gradual progress towards OT goals, agreeable to working with therapies this session. Pt continues to have limitations including pain, impaired cognition, and overall weakness. Pt tolerating sitting EOB and transfer OOB to recliner during session; he continues to require two person assist for mobility tasks as well as verbal/tactile cues throughout given decreased safety awareness and impulsivity. Pt requiring up to maxA (+2) for ADL at this time. Feel CIR recommendation remains most appropriate to maximize pt's overall safety and independence with ADL and mobility prior to return home. Will continue to follow acutely.    Follow Up Recommendations  CIR    Equipment Recommendations  3 in 1 bedside commode          Precautions / Restrictions Precautions Precautions: Fall;Other (comment) Precaution Comments: abdominal drain x 2, NG Restrictions Weight Bearing Restrictions: No       Mobility Bed Mobility Overal bed mobility: Needs Assistance Bed Mobility: Supine to Sit     Supine to sit: Min assist;+2 for physical assistance;HOB elevated        Transfers Overall transfer level: Needs assistance Equipment used: 2 person hand held assist Transfers: Sit to/from [facility] Sit to Stand: Min assist;+2 physical assistance Stand pivot transfers: Mod assist;+2 physical assistance       General transfer comment: pt is impulsiuve and requires tacilte cues to facilitate transfer and to maintain balance     Balance Overall balance assessment: Needs assistance Sitting-balance support: Single extremity supported;Feet supported;No upper extremity supported Sitting balance-Leahy Scale: Fair Sitting balance - Comments: pt able to maintain static balance with supervision, does often choose to lean over and utilize UE support of knees   Standing balance support: Bilateral upper extremity supported Standing balance-Leahy Scale: Poor Standing balance comment: minA x2 for static standing                           ADL either performed or assessed with clinical judgement   ADL Overall ADL's : Needs assistance/impaired Eating/Feeding: NPO       Upper Body Bathing: Maximal assistance Upper Body Bathing Details (indicate cue type and reason): to wash back                          Functional mobility during ADLs: Moderate assistance;+2 for physical assistance;+2 for safety/equipment (stand pivot ) General ADL Comments: currently requires increased assist for all ADL given pain, impaired cognition and decreased mobility status; he requires increased time for all aspects of mobility/functional tasks                        Cognition Arousal/Alertness: Awake/alert Behavior During Therapy: Impulsive Overall Cognitive Status: Impaired/Different from baseline Area of Impairment: Attention;Memory;Following commands;Safety/judgement;Awareness;Problem solving                   Current Attention Level: Sustained Memory: Decreased recall of precautions;Decreased short-term memory Following Commands: Follows one step commands consistently Safety/Judgement:  Decreased awareness of safety;Decreased awareness of deficits Awareness: Emergent Problem Solving: Slow processing;Requires verbal cues General Comments: cues throughout for safety given pt impulsivity (most notable when pt feeling the urge to urinate while seated in recliner end of session - pt scooting himself towards  the edge of the chair and almost too far past edge of chair); pt having difficulty motor planning and carrying out task of scooting hips back in chair when instructed to         Exercises     Shoulder Instructions       General Comments pt on 2L [place], mits on during session as pt has pulled out his NG multiple times     Pertinent Vitals/ Pain       Pain Assessment: 0-10 Pain Score: 7  Pain Location: abdomen Pain Descriptors / Indicators: Grimacing Pain Intervention(s): Monitored during session;Repositioned  Home Living                                          Prior Functioning/Environment              Frequency  Min 2X/week        Progress Toward Goals  OT Goals(current goals can now be found in the care plan section)  Progress towards OT goals: Progressing toward goals  Acute Rehab OT Goals Patient Stated Goal: go home OT Goal Formulation: With patient Time For Goal Achievement: 06/07/20 Potential to Achieve Goals: Good ADL Goals Pt Will Perform Grooming: with supervision;standing Pt Will Perform Lower Body Bathing: with modified independence;sit to/from stand Pt Will Perform Lower Body Dressing: with modified independence;sit to/from stand Pt Will Transfer to Toilet: with modified independence;ambulating Pt Will Perform Toileting - Clothing Manipulation and hygiene: with modified independence;sit to/from stand Pt/caregiver will Perform Home Exercise Program: Increased strength;With theraband;With written HEP provided;Independently Additional ADL Goal #1: Pt will independently verbalize 3 energy conservation strategies  Plan Discharge plan remains appropriate    Co-evaluation    PT/OT/SLP Co-Evaluation/Treatment: Yes Reason for Co-Treatment: Complexity of the patient's impairments (multi-system involvement);Necessary to address cognition/behavior during functional activity;For patient/therapist safety;To address functional/ADL transfers PT  goals addressed during session: Balance;Mobility/safety with mobility;Strengthening/ROM OT goals addressed during session: ADL's and self-care;Strengthening/ROM      AM-PAC OT "6 Clicks" Daily Activity     Outcome Measure   Help from another person eating meals?: Total (NPO) Help from another person taking care of personal grooming?: A Lot Help from another person toileting, which includes using toliet, bedpan, or urinal?: A Lot Help from another person bathing (including washing, rinsing, drying)?: A Lot Help from another person to put on and taking off regular upper body clothing?: A Lot Help from another person to put on and taking off regular lower body clothing?: A Lot 6 Click Score: 11    End of Session Equipment Utilized During Treatment: Oxygen  OT Visit Diagnosis: Unsteadiness on feet (R26.81);Other abnormalities of gait and mobility (R26.89);Muscle weakness (generalized) (M62.81);Other symptoms and signs involving cognitive function;Pain Pain - part of body:  (abdomen)   Activity Tolerance Patient tolerated treatment well;Patient limited by pain   Patient Left in chair;with call bell/phone within reach;with chair alarm set;with family/visitor present   Nurse Communication Mobility status;Need for lift equipment (maxisky pad placed if needed )        Time: [id number] OT Time Calculation (min): 35 min  Charges: OT General Charges $  OT Visit: 1 Visit OT Treatments $Self Care/Home Management : 8-22 mins  Lou Cal, OT Acute Rehabilitation Services Pager [phone] Office [phone]   Raymondo Band 06/06/2020, 1:03 PM

## 2020-06-06 NOTE — Progress Notes (Signed)
Physical Therapy Treatment Patient Details Name: Adam Fowler MRN: [id number] DOB: [birth year] Today's Date: 06/06/2020    History of Present Illness Pt is a 65 yo male who presents to the ED with severe abdominal pain, nausea and vomiting. Found to have post ERCP acute pancreatits. Pt developed Hypoxic resp failure and was Intubated 8/22  and extubated 8/24. Pt also with new onset a fib and acute ileus. PMH includes HTN and ERCP.     PT Comments    Pt limited by pain during session, also often reporting the urge to urinate during session with success one time. Pt is impulsive at times and requires PT assistance and cues for safety. Pt continues to demonstrate impaired balance and requires physical assistance to maintain standing and for all OOB activity. Pt will continue to benefit from acute PT POC to improve activity tolerance and to reduce falls risk. PT continues to recommend CIR placement at this time.   Follow Up Recommendations  CIR     Equipment Recommendations  Rolling walker with 5" wheels    Recommendations for Other Services       Precautions / Restrictions Precautions Precautions: Fall;Other (comment) Precaution Comments: abdominal drain x 2 Restrictions Weight Bearing Restrictions: No    Mobility  Bed Mobility Overal bed mobility: Needs Assistance Bed Mobility: Supine to Sit     Supine to sit: Min assist;+2 for physical assistance;HOB elevated        Transfers Overall transfer level: Needs assistance Equipment used: 2 person hand held assist Transfers: Sit to/from [facility] Sit to Stand: Min assist;+2 physical assistance Stand pivot transfers: Mod assist;+2 physical assistance       General transfer comment: pt is impulsiuve and requires tacilte cues to facilitate transfer and to maintain balance  Ambulation/Gait                 Stairs             Wheelchair Mobility    Modified Rankin (Stroke Patients Only)        Balance Overall balance assessment: Needs assistance Sitting-balance support: Single extremity supported;Feet supported;No upper extremity supported Sitting balance-Leahy Scale: Fair Sitting balance - Comments: pt able to maintain static balance with supervision, does often choose to lean over and utilize UE support of knees   Standing balance support: Bilateral upper extremity supported Standing balance-Leahy Scale: Poor Standing balance comment: minA x2 for static standing                            Cognition Arousal/Alertness: Awake/alert Behavior During Therapy: Impulsive Overall Cognitive Status: Impaired/Different from baseline Area of Impairment: Attention;Memory;Following commands;Safety/judgement;Awareness;Problem solving                   Current Attention Level: Sustained Memory: Decreased recall of precautions;Decreased short-term memory Following Commands: Follows one step commands consistently Safety/Judgement: Decreased awareness of safety;Decreased awareness of deficits Awareness: Emergent Problem Solving: Slow processing;Requires verbal cues        Exercises      General Comments General comments (skin integrity, edema, etc.): pt on 2L [place], mits on during session      Pertinent Vitals/Pain Pain Assessment: 0-10 Pain Score: 7  Pain Location: abdomen Pain Descriptors / Indicators: Grimacing Pain Intervention(s): Monitored during session    Home Living                      Prior Function  PT Goals (current goals can now be found in the care plan section) Acute Rehab PT Goals Patient Stated Goal: go home Progress towards PT goals: Progressing toward goals    Frequency    Min 3X/week      PT Plan Current plan remains appropriate    Co-evaluation PT/OT/SLP Co-Evaluation/Treatment: Yes Reason for Co-Treatment: Complexity of the patient's impairments (multi-system involvement);Necessary to address  cognition/behavior during functional activity;For patient/therapist safety;To address functional/ADL transfers PT goals addressed during session: Balance;Mobility/safety with mobility;Strengthening/ROM        AM-PAC PT "6 Clicks" Mobility   Outcome Measure  Help needed turning from your back to your side while in a flat bed without using bedrails?: A Little Help needed moving from lying on your back to sitting on the side of a flat bed without using bedrails?: A Little Help needed moving to and from a bed to a chair (including a wheelchair)?: A Lot Help needed standing up from a chair using your arms (e.g., wheelchair or bedside chair)?: A Lot Help needed to walk in hospital room?: A Lot Help needed climbing 3-5 steps with a railing? : Total 6 Click Score: 13    End of Session Equipment Utilized During Treatment: Oxygen Activity Tolerance: Patient limited by pain Patient left: in chair;with call bell/phone within reach;with chair alarm set;with family/visitor present;with restraints reapplied Nurse Communication: Mobility status PT Visit Diagnosis: Unsteadiness on feet (R26.81);Muscle weakness (generalized) (M62.81);Difficulty in walking, not elsewhere classified (R26.2);Pain Pain - part of body:  (abdomen)     Time: 1829-9371 PT Time Calculation (min) (ACUTE ONLY): 47 min  Charges:  $Therapeutic Activity: 23-37 mins                     Zenaida Niece, PT, DPT Acute Rehabilitation Pager: [phone]    Zenaida Niece 06/06/2020, 11:01 AM

## 2020-06-06 NOTE — Progress Notes (Signed)
eLink Physician-Brief Progress Note Patient Name: Adam Fowler DOB: 1956-03-16 MRN: [id number]   Date of Service  06/06/2020  HPI/Events of Note  Patient pulled out NGT. NGT replaced. Request for abdominal film to confirm placement.   eICU Interventions  PLan: 1. Portable abdominal x-ray STAT.      Intervention Category Major Interventions: Other:  Lysle Dingwall 06/06/2020, 12:25 AM

## 2020-06-06 NOTE — Progress Notes (Signed)
NAME:  Adam Fowler, MRN:  [id number], DOB:  December 13, 1955, LOS: 34 ADMISSION DATE:  04/26/2020, CONSULTATION DATE:  9/23 REFERRING MD:  Wyline Copas, CHIEF COMPLAINT:  Hypotension   Brief History   64 yo M s/p subtotal cholecystectomy c/b persistent bile leak and biliary fistula, post ERCP necrotizing pancreatitis-- developed hypotension 9/23 and CCM consulted   Past Medical History  Kidney stones HTN Insomnia S/p cholecystectomy and abscess  Pancreatitis   Significant Hospital Events   8/20 admit 8/21-24 admitted 9/21 perc bilary drainage  9/23 drain capped. Acute decompensation in afternoon. Fluid resuscitation, starting on pressors and transferring to ICU  9/27 started fentanyl infusion, improvement in pain control; concern for pill fragments in GB fossa drain 9/28 placed blue food coloring in NG tube, not seen in GB fossa drain  Consults:  PCCM GI CCS IR  Procedures:  9/3 GB fossa drain 9/21 cholangiogram and biliary drain placement  Significant Diagnostic Tests:  9/18 ct a/p w/con> moderate peripancreatic fluid extending into gastrohepatic ligament. Possible pseudocyst. Pigtail drainage catheter tip in region of gallbladder fossa. CBD stent. Persistent bibasilar opacities. L pleural effusion. Moderate ascites. Colonic diverticulosis  9/23 KUB>> cholecystostomy tube & internal/external biliary drain noted with a CBD stent in place, gastric tube within the stomach. Scattered large & small bowel gas pattern noted  9/23 ct a/p w con>>>again noted are findings of acute pancreatitis with relatively stable peripancreatic fluid collections.  Findings consistent with a gastric ileus. The esophagus is dilated and fluid-filled to at least the mid thorax. This patient would likely benefit from NG tube decompression. Worsening airspace opacities at the lung bases concerning for developing infiltrates or aspiration. Moderate-sized left-sided pleural effusion with at least partial collapse of the  left lower lobe. There is a trace to small right-sided pleural effusion with mild adjacent atelectasis.  Stable small volume abdominal ascites.  Well-positioned internal/external biliary drain. Unchanged cholecystostomy tube.   Micro Data:  8/19 SARS COV 2 > neg 8/19 blood > neg 9/10 body fluid culture > neg 9/23 blood > enterococcus faecalis, klebsiella 9/25 blood > NGTD  Antimicrobials:  9/21 zosyn x1 9/24 mero > 9/26 9/24 zosyn x1 9/24 vanc > 9/26 9/27 unasyn >   Interim history/subjective:   blue food coloring placed in NG, he removed it later, no blue output from GB fossa, now up in chair  Objective   Blood pressure (!) 135/96, pulse 89, temperature 99.7 F (37.6 C), temperature source Axillary, resp. rate 12, height 5\' 9"  (1.753 m), weight 88.9 kg, SpO2 100 %.        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, s/p ERCP who has necrotizing pancreatitis with perihepatic fluid collections GB fossa and biliary drains in place Concern that meds from NG have communicate to GB fossa drain?> doubt, no dye passed from gastric tube to gallbladder fossa 9/28 Restart home medications now TPN  NPO until OK to have enteral feeding per GI  Bacteremia in setting of peritonitis, enterococcal and klebsiella Septic shock > resolved Antibiotics per ID  Will need TEE prior to hospital discharge> still too weak for  that  Acute hypoxemic respiratory failure  > resolved Minimize sedation  Esophagitis BID PPI  Acute metabolic encephalopathy : narcotic related, sepsis related Prn ativan for anxiety Appreciate palliative medicine consultation> continue fentanyl infusion for now as it has helped a lot but would like to convert to an equivalent cutaneous or oral option to get him off the infusion  Insomnia Ambien at night  Depression celexa  Hypertension Monitor  Hypercholesterolemia Restart pravastatin   Best practice:  Diet: TPN Pain/Anxiety/Delirium protocol (if indicated): n/a VAP protocol (if indicated): n/a DVT prophylaxis: lovenox GI prophylaxis: BID protonix Glucose control: SSI Mobility: out of bed as able Code Status: full Family Communication: wife updated 9/29 baseline Disposition:   Labs   CBC: Recent Labs  Lab 06/01/20 0809 06/02/20 1026 06/04/20 0538 06/05/20 0403 06/06/20 0455  WBC 42.2* 18.5* 19.0* 18.2* 17.7*  NEUTROABS 39.2* 16.0* 17.3* 15.8* 13.9*  HGB 7.8* 7.7* 8.5* 8.5* 8.0*  HCT 27.9* 26.8* 29.9* 28.5* 27.6*  MCV 95.9 93.7 91.7 90.2 89.9  PLT 296 250 258 267 856    Basic Metabolic Panel: Recent Labs  Lab 05/31/20 0458 05/31/20 1327 06/02/20 0550 06/03/20 0528 06/04/20 0538 06/05/20 0403 06/06/20 0455  NA 143   < > 146* 143 141 140 140  K 4.7   < > 3.7 3.3* 3.6 3.7 3.9  CL 111   < > 116* 116* 112* 112* 111  CO2 22   < > 21* 21* 21* 20* 21*  GLUCOSE 206*   < > 228* 159* 123* 179* 150*  BUN 47*   < > 48* 40* 38* 34* 29*  CREATININE 1.05   < > 0.90 0.70 0.77 0.73 0.69  CALCIUM 8.6*   < > 8.4* 8.3* 8.5* 8.4* 8.6*  MG 2.4  --  2.3 2.0 2.1  --   --   PHOS 3.4  --  2.7 2.7 2.7  --   --    < > = values in this interval not displayed.   GFR: Estimated Creatinine Clearance: 102.9 mL/min (by C-G formula based on SCr of 0.69 mg/dL). Recent Labs  Lab 05/31/20 1327 05/31/20 1834 06/01/20 0809 06/01/20 0809 06/01/20 0856 06/01/20 1157  06/02/20 0550 06/02/20 1026 06/03/20 0528 06/04/20 0538 06/05/20 0403 06/06/20 0455  PROCALCITON  --   --  30.80  --   --   --  16.76  --  8.94  --   --   --   WBC   < >  --  42.2*   < >  --   --   --  18.5*  --  19.0* 18.2* 17.7*  LATICACIDVEN  --  5.3* 2.9*  --  3.5* 2.8*  --   --   --   --   --   --    < > = values in this interval not displayed.    Liver Function Tests: Recent Labs  Lab 06/01/20 0856 06/02/20 0550 06/04/20 0538 06/05/20 0403 06/06/20 0455  AST 48* 27 31 27 22   ALT 39 30 31 27 23   ALKPHOS 318* 246* 257* 210* 187*  BILITOT 1.2 1.1 0.9 0.9 0.9  PROT 6.0* 5.6* 5.7* 5.6* 5.8*  ALBUMIN 1.7* 1.5* 1.4* 1.8* 2.1*   No results for input(s): LIPASE, AMYLASE in  the last 168 hours. No results for input(s): AMMONIA in the last 168 hours.  ABG    Component Value Date/Time   PHART 7.295 (L) 05/31/2020 1625   PCO2ART 38.6 05/31/2020 1625   PO2ART 62.0 (L) 05/31/2020 1625   HCO3 18.0 (L) 05/31/2020 1625   TCO2 26 04/30/2020 0241   ACIDBASEDEF 7.1 (H) 05/31/2020 1625   O2SAT 87.5 05/31/2020 1625     Coagulation Profile: Recent Labs  Lab 05/31/20 1415  INR 1.5*    Cardiac Enzymes: No results for input(s): CKTOTAL, CKMB, CKMBINDEX, TROPONINI in the last 168 hours.  HbA1C: Hgb A1c MFr Bld  Date/Time Value Ref Range Status  04/29/2020 02:02 AM 5.6 4.8 - 5.6 % Final    Comment:    (NOTE) Pre diabetes:          5.7%-6.4%  Diabetes:              >6.4%  Glycemic control for   <7.0% adults with diabetes   02/13/2020 04:55 PM 6.0 (H) 4.8 - 5.6 % Final    Comment:    (NOTE) Pre diabetes:          5.7%-6.4% Diabetes:              >6.4% Glycemic control for   <7.0% adults with diabetes     CBG: Recent Labs  Lab 06/05/20 1201 06/05/20 1558 06/05/20 1949 06/05/20 2354 06/06/20 0336  GLUCAP 153* 133* 169* 120* 139*     Critical care time: 30 minutes    Roselie Awkward, MD [facility] Pager: [phone] Cell: [phone] If no response,  call [phone]

## 2020-06-06 NOTE — Progress Notes (Signed)
[facility]  Per conversation with surgery, OK to place small bore post pyloric tube and start low dose tube feeding  Roselie Awkward, MD [facility] Pager: [phone] Cell: [phone] If no response, call [phone]

## 2020-06-07 ENCOUNTER — Inpatient Hospital Stay (HOSPITAL_COMMUNITY): Payer: 59

## 2020-06-07 DIAGNOSIS — K810 Acute cholecystitis: Secondary | ICD-10-CM

## 2020-06-07 HISTORY — PX: IR EXCHANGE BILIARY DRAIN: IMG6046

## 2020-06-07 LAB — CULTURE, BLOOD (SINGLE)
Culture: NO GROWTH
Culture: NO GROWTH
Special Requests: ADEQUATE
Special Requests: ADEQUATE

## 2020-06-07 LAB — PH, BODY FLUID: pH, Body Fluid: 8.2

## 2020-06-07 LAB — CBC WITH DIFFERENTIAL/PLATELET
Abs Immature Granulocytes: 0.51 10*3/uL — ABNORMAL HIGH (ref 0.00–0.07)
Basophils Absolute: 0.1 10*3/uL (ref 0.0–0.1)
Basophils Relative: 1 %
Eosinophils Absolute: 0.3 10*3/uL (ref 0.0–0.5)
Eosinophils Relative: 1 %
HCT: 29.4 % — ABNORMAL LOW (ref 39.0–52.0)
Hemoglobin: 8.5 g/dL — ABNORMAL LOW (ref 13.0–17.0)
Immature Granulocytes: 2 %
Lymphocytes Relative: 6 %
Lymphs Abs: 1.4 10*3/uL (ref 0.7–4.0)
MCH: 26.4 pg (ref 26.0–34.0)
MCHC: 28.9 g/dL — ABNORMAL LOW (ref 30.0–36.0)
MCV: 91.3 fL (ref 80.0–100.0)
Monocytes Absolute: 0.9 10*3/uL (ref 0.1–1.0)
Monocytes Relative: 4 %
Neutro Abs: 18.2 10*3/uL — ABNORMAL HIGH (ref 1.7–7.7)
Neutrophils Relative %: 86 %
Platelets: 286 10*3/uL (ref 150–400)
RBC: 3.22 MIL/uL — ABNORMAL LOW (ref 4.22–5.81)
RDW: 16 % — ABNORMAL HIGH (ref 11.5–15.5)
WBC: 21.4 10*3/uL — ABNORMAL HIGH (ref 4.0–10.5)
nRBC: 0.1 % (ref 0.0–0.2)

## 2020-06-07 LAB — GLUCOSE, CAPILLARY
Glucose-Capillary: 156 mg/dL — ABNORMAL HIGH (ref 70–99)
Glucose-Capillary: 174 mg/dL — ABNORMAL HIGH (ref 70–99)
Glucose-Capillary: 169 mg/dL — ABNORMAL HIGH (ref 70–99)
Glucose-Capillary: 143 mg/dL — ABNORMAL HIGH (ref 70–99)
Glucose-Capillary: 155 mg/dL — ABNORMAL HIGH (ref 70–99)
Glucose-Capillary: 164 mg/dL — ABNORMAL HIGH (ref 70–99)

## 2020-06-07 LAB — COMPREHENSIVE METABOLIC PANEL
ALT: 25 U/L (ref 0–44)
AST: 32 U/L (ref 15–41)
Albumin: 2 g/dL — ABNORMAL LOW (ref 3.5–5.0)
Alkaline Phosphatase: 268 U/L — ABNORMAL HIGH (ref 38–126)
Anion gap: 9 (ref 5–15)
BUN: 33 mg/dL — ABNORMAL HIGH (ref 8–23)
CO2: 21 mmol/L — ABNORMAL LOW (ref 22–32)
Calcium: 8.7 mg/dL — ABNORMAL LOW (ref 8.9–10.3)
Chloride: 112 mmol/L — ABNORMAL HIGH (ref 98–111)
Creatinine, Ser: 0.72 mg/dL (ref 0.61–1.24)
GFR calc Af Amer: >60 mL/min (ref >60)
GFR calc non Af Amer: >60 mL/min (ref >60)
Glucose, Bld: 183 mg/dL — ABNORMAL HIGH (ref 70–99)
Potassium: 4.4 mmol/L (ref 3.5–5.1)
Sodium: 142 mmol/L (ref 135–145)
Total Bilirubin: 1 mg/dL (ref 0.3–1.2)
Total Protein: 6.1 g/dL — ABNORMAL LOW (ref 6.5–8.1)

## 2020-06-07 LAB — PHOSPHORUS: Phosphorus: 3.6 mg/dL (ref 2.5–4.6)

## 2020-06-07 LAB — MAGNESIUM: Magnesium: 2.2 mg/dL (ref 1.7–2.4)

## 2020-06-07 SURGERY — ERCP, WITH INTERVENTION IF INDICATED
Anesthesia: General

## 2020-06-07 MED ORDER — TRAVASOL 10 % IV SOLN
INTRAVENOUS | Status: AC
  Filled 2020-06-07: qty 1440

## 2020-06-07 MED ORDER — FENTANYL CITRATE (PF) 100 MCG/2ML IJ SOLN
INTRAMUSCULAR | Status: AC | PRN
  Administered 2020-06-07 (×2): 50 ug via INTRAVENOUS

## 2020-06-07 MED ORDER — CLONAZEPAM 1 MG PO TABS
1.0000 mg | ORAL_TABLET | Freq: Two times a day (BID) | ORAL | Status: DC
  Administered 2020-06-07 – 2020-06-08 (×2): 1 mg
  Filled 2020-06-07 (×3): qty 1

## 2020-06-07 MED ORDER — MIDAZOLAM HCL 2 MG/2ML IJ SOLN
INTRAMUSCULAR | Status: AC | PRN
  Administered 2020-06-07: 1 mg via INTRAVENOUS

## 2020-06-07 MED ORDER — IOHEXOL 300 MG/ML  SOLN
50.0000 mL | Freq: Once | INTRAMUSCULAR | Status: AC | PRN
  Administered 2020-06-07: 25 mL

## 2020-06-07 MED ORDER — FENTANYL CITRATE (PF) 100 MCG/2ML IJ SOLN
INTRAMUSCULAR | Status: AC
Start: 2020-06-07 — End: 2020-06-08
  Filled 2020-06-07: qty 2

## 2020-06-07 MED ORDER — LIDOCAINE HCL (PF) 1 % IJ SOLN
INTRAMUSCULAR | Status: AC | PRN
  Administered 2020-06-07: 5 mL

## 2020-06-07 MED ORDER — SODIUM CHLORIDE 0.9% FLUSH
5.0000 mL | Freq: Three times a day (TID) | INTRAVENOUS | Status: DC
  Administered 2020-06-07 – 2020-06-15 (×21): 5 mL

## 2020-06-07 MED ORDER — MIDAZOLAM HCL 2 MG/2ML IJ SOLN
INTRAMUSCULAR | Status: AC
  Filled 2020-06-07: qty 2

## 2020-06-07 MED ORDER — LIDOCAINE HCL (PF) 1 % IJ SOLN
INTRAMUSCULAR | Status: AC
  Filled 2020-06-07: qty 30

## 2020-06-07 NOTE — Progress Notes (Signed)
Referring Physician(s): Dr. Donne Hazel  Supervising Physician: Corrie Mckusick  Patient Status:  [facility] - In-pt  Chief Complaint: High-output biliary fistula s/p cholecystectomy s/p biloma drain placement 6/7. Post-ERCP pancreatitis with gastric outlet obstruction; S/p common bile duct stent placement, but with continued increased biloma drain output; S/p internal/external biliary drain placement 9/21.  Subjective: Patient in bed, moaning and encephalopathic. Abdomen is significantly distended and taut. Biliary drain with significant leaking at the site - drainage hole visible.   Allergies: Toradol [ketorolac tromethamine]  Medications: Prior to Admission medications   Medication Sig Start Date End Date Taking? Authorizing Provider  Calcium Carb-Cholecalciferol (CALCIUM+D3 PO) Take 1 tablet by mouth daily.   Yes [provider]  Coenzyme Q10 (COQ10) 400 MG CAPS Take 800 mg by mouth in the morning.    Yes [provider]  Cyanocobalamin (VITAMIN B-12) 5000 MCG TBDP Take 5,000 mcg by mouth daily.   Yes [provider]  GLUCOSAMINE-CHONDROITIN PO Take 1 tablet by mouth in the morning and at bedtime.    Yes [provider]  Javier Docker Oil 1000 MG CAPS Take 1,000 mg by mouth in the morning and at bedtime.    Yes [provider]  lisinopril-hydrochlorothiazide (ZESTORETIC) 10-12.5 MG tablet Take 1 tablet by mouth daily. 03/04/20  Yes [provider]  Moringa Oleifera (MORINGA PO) Take 1,000 mg by mouth 2 (two) times daily.   Yes [provider]  Multiple Vitamin (MULTIVITAMIN WITH MINERALS) TABS tablet Take 1 tablet by mouth daily.   Yes [provider]  ondansetron (ZOFRAN) 4 MG tablet Take 1 tablet (4 mg total) by mouth every 8 (eight) hours as needed for nausea or vomiting. 04/25/20  Yes Mansouraty, Telford Nab., MD  OVER THE COUNTER MEDICATION Take 1 tablet by mouth See admin instructions. [facility] otc tablet- Take 1 tablet by  mouth once a day   Yes [provider]  oxyCODONE (OXY IR/ROXICODONE) 5 MG immediate release tablet Take 5 mg by mouth 2 (two) times daily as needed (for pain).  04/25/20  Yes [provider]  pravastatin (PRAVACHOL) 80 MG tablet Take 40 mg by mouth in the morning and at bedtime.   Yes [provider]  Red Yeast Rice 600 MG CAPS Take 1,200 mg by mouth at bedtime.    Yes [provider]  sertraline (ZOLOFT) 100 MG tablet Take 100 mg by mouth daily. 11/08/19  Yes [provider]  TURMERIC PO Take 1,500 mg by mouth 2 (two) times daily.   Yes [provider]  zolpidem (AMBIEN) 5 MG tablet Take 5 mg by mouth at bedtime. 12/29/19  Yes [provider]  ibuprofen (ADVIL) 200 MG tablet Take 400-600 mg by mouth every 6 (six) hours as needed for headache or moderate pain.    [provider]     Vital Signs: BP 129/83   Pulse (!) 113   Temp 98.8 F (37.1 C) (Oral)   Resp (!) 33   Ht 5\' 9"  (1.753 m)   Wt 195 lb 15.8 oz (88.9 kg)   SpO2 94%   BMI 28.94 kg/m   Physical Exam Constitutional:      General: He is in acute distress.     Appearance: He is ill-appearing.     Comments: Eyes intermittently open; moaning; pulling at lines and tubes.   HENT:     Nose:     Comments: Cortrak in place for post-jejunal trickle feeds.     Mouth/Throat:  Mouth: Mucous membranes are dry.     Pharynx: Oropharynx is clear.  Cardiovascular:     Rate and Rhythm: Regular rhythm. Tachycardia present.     Pulses: Normal pulses.     Heart sounds: Normal heart sounds.  Pulmonary:     Effort: Tachypnea present.     Breath sounds: Normal breath sounds.  Abdominal:     General: There is distension.     Tenderness: There is abdominal tenderness.     Comments: Anterior drain (biloma drain) in place. Approximately 300 cc dark bile with evidence of crushed medications mixed in. Easily flushed with 5 cc NS. Posterior (internal/external biliary drain)  in place. Approximately 15 cc serosanguineous output in collection bag. Side hole visible/leaking.   Both drains are secured with sutures and stat lock. Entire RUQ area is covered in tegaderm. This has trapped bile/moisture against the skin. Skin around biliary drain is quite erythematous and irritated.  Skin:    General: Skin is warm and dry.     Imaging: DG Abd 1 View  Result Date: 06/06/2020 CLINICAL DATA:  Check feeding catheter placement EXAM: ABDOMEN - 1 VIEW COMPARISON:  Film from earlier in the same day. FINDINGS: Gastric catheter has been removed. New weighted feeding catheter has been placed and extends into the fourth portion of the duodenum near the proximal jejunum. Previous cholecystostomy tube and biliary drain are again seen and stable. No obstructive changes are seen. IMPRESSION: Feeding tube in satisfactory position in the distal duodenum. Electronically Signed   By: Inez Catalina M.D.   On: 06/06/2020 12:39   DG Abd Portable 1V  Result Date: 06/06/2020 CLINICAL DATA:  NG tube placement EXAM: PORTABLE ABDOMEN - 1 VIEW COMPARISON:  Radiograph 06/05/2020 FINDINGS: Transesophageal tube coiling in the left upper quadrant terminating in the region of the gastric body with side port distal to the GE junction. An internal/external biliary drain is again seen traversing a biliary stent as well as a pigtail percutaneous drain in the right upper quadrant. No high-grade obstructive bowel gas pattern. No suspicious calcifications within the abdomen. Persistent basilar atelectatic changes and small left pleural effusion are noted. Degenerative changes in the spine, hips and pelvis. IMPRESSION: 1. Transesophageal tube coiling in the left upper quadrant, terminating in the gastric body with side port distal to the GE junction. Electronically Signed   By: Lovena Le M.D.   On: 06/06/2020 00:56   DG Abd Portable 1V  Result Date: 06/05/2020 CLINICAL DATA:  NG tube advancement EXAM: PORTABLE  ABDOMEN - 1 VIEW COMPARISON:  06/05/2020 FINDINGS: Enteric tube tip is advanced to the upper mid abdomen consistent with location in the distal stomach. Examination is otherwise unchanged. IMPRESSION: Enteric tube tip is in the distal stomach. Electronically Signed   By: Lucienne Capers M.D.   On: 06/05/2020 06:10   DG Abd Portable 1V  Result Date: 06/05/2020 CLINICAL DATA:  NG tube placement EXAM: PORTABLE ABDOMEN - 1 VIEW COMPARISON:  06/03/2020 FINDINGS: A catheter tip is visualized in the upper mediastinum, about 3.3 cm above the carina. It is unclear whether this is a normally placed endotracheal tube or a short enteric tube. Right PICC line with tip over the cavoatrial junction region. Biliary wall stent with apparent percutaneous biliary drain. Pigtail catheter also in the right upper quadrant, possibly cholecystostomy. Paucity of gas in the upper abdomen. IMPRESSION: Catheter tip is in the upper mediastinum, about 3.3 cm above the carina. It is unclear whether this is a normally placed endotracheal  tube or a short enteric tube. Electronically Signed   By: Lucienne Capers M.D.   On: 06/05/2020 05:44   DG Abd Portable 1V  Result Date: 06/04/2020 CLINICAL DATA:  OG tube placement EXAM: PORTABLE ABDOMEN - 1 VIEW COMPARISON:  Radiograph 06/02/2020, CT 05/31/2020 FINDINGS: Tip of the transesophageal tube terminating in the left upper quadrant with the side port beyond the GE junction. Biliary stent is in place traversed by an internal-external biliary drain. Terminus of a pigtail drain is noted as well in the right upper quadrant. Telemetry leads overlie the abdomen. Left basilar effusion. Atelectatic changes in the right lung base. No high-grade obstructive bowel gas pattern. Diffuse mild body wall edema is noted. Suspect ascites as well. Osseous structures are free of acute abnormality with mild degenerative changes in the spine, hips and pelvis. IMPRESSION: 1. Tip of the transesophageal tube  terminating in the left upper quadrant in the vicinity of the gastric body with the side port beyond the GE junction. 2. Biliary stent and internal-external biliary drain in place. 3. Suspect ascites and diffuse body wall edema. Electronically Signed   By: Lovena Le M.D.   On: 06/04/2020 00:14    Labs:  CBC: Recent Labs    06/04/20 0538 06/05/20 0403 06/06/20 0455 06/07/20 0423  WBC 19.0* 18.2* 17.7* 21.4*  HGB 8.5* 8.5* 8.0* 8.5*  HCT 29.9* 28.5* 27.6* 29.4*  PLT 258 267 258 286    COAGS: Recent Labs    02/02/20 2115 05/16/20 1905 05/29/20 0404 05/31/20 1415  INR 1.1 1.3* 1.4* 1.5*    BMP: Recent Labs    06/04/20 0538 06/05/20 0403 06/06/20 0455 06/07/20 0423  NA 141 140 140 142  K 3.6 3.7 3.9 4.4  CL 112* 112* 111 112*  CO2 21* 20* 21* 21*  GLUCOSE 123* 179* 150* 183*  BUN 38* 34* 29* 33*  CALCIUM 8.5* 8.4* 8.6* 8.7*  CREATININE 0.77 0.73 0.69 0.72  GFRNONAA >60 >60 >60 >60  GFRAA >60 >60 >60 >60    LIVER FUNCTION TESTS: Recent Labs    06/04/20 0538 06/05/20 0403 06/06/20 0455 06/07/20 0423  BILITOT 0.9 0.9 0.9 1.0  AST 31 27 22  32  ALT 31 27 23 25   ALKPHOS 257* 210* 187* 268*  PROT 5.7* 5.6* 5.8* 6.1*  ALBUMIN 1.4* 1.8* 2.1* 2.0*    Assessment and Plan:  High-output biliary fistula s/p cholecystectomy s/p biloma drain placement 6/7. Post-ERCP pancreatitis with gastric outlet obstruction; S/p common bile duct stent placement, but with continued increased biloma drain output; S/p internal/external biliary drain placement 9/21. Attempted capping of biliary drain was unsuccessful.   Markedly increased output from the biloma drain with evidence of crushed pills in the gravity bag. Internal/external biliary drain has minimal output in gravity bag but is leaking around the site, side hole is visible. There is concern for occlusion at the distal end of the drain causing bile to reflux into the biloma drain. The patient will come to IR today for an  exchange/upsize of the internal/external biliary drain with possible stent placement. The biloma drain will also be exchanged. Consent was obtained from the patient's wife who was at the bedside.   Risks and benefits discussed with the patient's wife including bleeding, infection, damage to adjacent structures and sepsis.  All of the patient's wife's questions were answered, patient's wife is agreeable to proceed.  Consent signed and in chart.  Electronically Signed: Soyla Dryer, AGACNP-BC [phone] 06/07/2020, 11:49 AM   I spent a  total of 25 Minutes at the the patient's bedside AND on the patient's hospital floor or unit, greater than 50% of which was counseling/coordinating care for biliary and biloma drains.

## 2020-06-07 NOTE — Progress Notes (Signed)
[facility]  Date of Admission:  04/26/2020     Total days of antibiotics 8         ASSESSMENT:  Adam Fowler has remained stable overnight and appears to be tolerating his tube feeding which is now currently off. Blood cultures from 9/25 have finalized without growth. Plan remains to continue current dose of Unasyn with duration pending imaging results tomorrow and TEE when able.  PLAN:  1. Continue current dose of Unasyn.  2. Await planned imaging results for duration of therapy.    Principal Problem:   Post-ERCP acute pancreatitis Active Problems:   Severe sepsis (HCC)   Polycythemia   AKI (acute kidney injury) ([place])   Metabolic acidosis   Acute respiratory failure with hypoxia (HCC)   Bilateral pleural effusion   Ileus (HCC)   Bile duct leak   Gastric outlet obstruction   Abdominal distention   ARDS (adult respiratory distress syndrome) ([place])   Biloma following surgery   Acute blood loss anemia   Palliative care encounter   Chest pain   . Chlorhexidine Gluconate Cloth  6 each Topical Daily  . enoxaparin (LOVENOX) injection  40 mg Subcutaneous Q24H  . fentaNYL  2 patch Transdermal Q72H  . [START ON 06/09/2020] fentaNYL  1 patch Transdermal Q72H  . influenza vac split quadrivalent PF  0.5 mL Intramuscular Tomorrow-1000  . insulin aspart  0-20 Units Subcutaneous Q4H  . lidocaine  1 patch Transdermal Q24H  . mouth rinse  15 mL Mouth Rinse BID  . pantoprazole (PROTONIX) IV  40 mg Intravenous Q12H  . pravastatin  20 mg Per Tube q1800  . sertraline  100 mg Per Tube Daily  . sodium chloride flush  10-40 mL Intracatheter Q12H  . sodium chloride flush  5 mL Intracatheter Q8H  . tamsulosin  0.4 mg Oral Daily  . zolpidem  5 mg Per Tube QHS    SUBJECTIVE:  Mildly elevated temperature of 100.5 overnight with no acute events. Did not rest well last night. Continues to have abdominal pain and some shortness of breath. Feeling tired.    Allergies  Allergen Reactions  . Toradol [Ketorolac Tromethamine] Other (See Comments)    delirium     Review of Systems: Review of Systems  Constitutional: Negative for chills, fever and weight loss.  Respiratory: Negative for cough, shortness of breath and wheezing.   Cardiovascular: Negative for chest pain and leg swelling.  Gastrointestinal: Positive for abdominal pain. Negative for constipation, diarrhea, nausea and vomiting.  Skin: Negative for rash.      OBJECTIVE: Vitals:   06/07/20 0700 06/07/20 0800 06/07/20 0900 06/07/20 1000  BP: 129/81 137/90 125/84 129/83  Pulse: (!) 113 (!) 115 (!) 114 (!) 113  Resp: (!) 28 (!) 24 (!) 24 (!) 33  Temp:  98.8 F (37.1 C)    TempSrc:  Oral    SpO2: 95% 95% 94% 94%  Weight:      Height:       Body mass index is 28.94 kg/m.  Physical Exam Constitutional:      General: He is not in acute distress.    Appearance: He is well-developed. He is ill-appearing.     Interventions: Nasal cannula in place.     Comments: Lying in bed with head of bed elevated; lethargic and pleasant.   Cardiovascular:     Rate and Rhythm: Normal rate and regular rhythm.     Heart sounds: Normal heart sounds.  Pulmonary:  Effort: Pulmonary effort is normal.     Breath sounds: Normal breath sounds.  Abdominal:     General: There is distension.     Comments: Drains in place with bilious fluid.   Skin:    General: Skin is warm and dry.  Neurological:     Mental Status: He is oriented to person, place, and time.  Psychiatric:        Behavior: Behavior normal.        Thought Content: Thought content normal.        Judgment: Judgment normal.     Lab Results Lab Results  Component Value Date   WBC 21.4 (H) 06/07/2020   HGB 8.5 (L) 06/07/2020   HCT 29.4 (L) 06/07/2020   MCV 91.3 06/07/2020   PLT 286 06/07/2020    Lab Results  Component Value Date   CREATININE 0.72 06/07/2020   BUN 33 (H) 06/07/2020   NA 142 06/07/2020   K 4.4  06/07/2020   CL 112 (H) 06/07/2020   CO2 21 (L) 06/07/2020    Lab Results  Component Value Date   ALT 25 06/07/2020   AST 32 06/07/2020   ALKPHOS 268 (H) 06/07/2020   BILITOT 1.0 06/07/2020     Microbiology: Recent Results (from the past 240 hour(s))  Culture, blood (Routine X 2) w Reflex to ID Panel     Status: Abnormal   Collection Time: 05/31/20  6:34 PM   Specimen: BLOOD LEFT HAND  Result Value Ref Range Status   Specimen Description BLOOD LEFT HAND  Final   Special Requests   Final    BOTTLES DRAWN AEROBIC ONLY Blood Culture results may not be optimal due to an inadequate volume of blood received in culture bottles   Culture  Setup Time   Final    GRAM NEGATIVE RODS AEROBIC BOTTLE ONLY CRITICAL VALUE NOTED.  VALUE IS CONSISTENT WITH PREVIOUSLY REPORTED AND CALLED VALUE.    Culture (A)  Final    KLEBSIELLA PNEUMONIAE SUSCEPTIBILITIES PERFORMED ON PREVIOUS CULTURE WITHIN THE LAST 5 DAYS. Performed at [facility], [place] [address] [place], [place] [address]    Report Status 06/04/2020 FINAL  Final  Culture, blood (Routine X 2) w Reflex to ID Panel     Status: Abnormal   Collection Time: 05/31/20  6:37 PM   Specimen: BLOOD  Result Value Ref Range Status   Specimen Description BLOOD LEFT ANTECUBITAL  Final   Special Requests   Final    BOTTLES DRAWN AEROBIC AND ANAEROBIC Blood Culture results may not be optimal due to an inadequate volume of blood received in culture bottles   Culture  Setup Time   Final    GRAM POSITIVE COCCI GRAM NEGATIVE RODS AEROBIC BOTTLE ONLY CRITICAL RESULT CALLED TO, READ BACK BY AND VERIFIED WITH: J [facility] PHARMD 06/02/20 0209 JDW GRAM NEGATIVE RODS ANAEROBIC BOTTLE ONLY Performed at [facility], [place] [address], [place], [place] [address]    Culture ENTEROCOCCUS FAECALIS KLEBSIELLA PNEUMONIAE  (A)  Final   Report Status 06/04/2020 FINAL  Final   Organism ID, Bacteria ENTEROCOCCUS FAECALIS  Final   Organism ID, Bacteria  KLEBSIELLA PNEUMONIAE  Final      Susceptibility   Enterococcus faecalis - MIC*    AMPICILLIN <=2 SENSITIVE Sensitive     VANCOMYCIN 1 SENSITIVE Sensitive     GENTAMICIN SYNERGY SENSITIVE Sensitive     * ENTEROCOCCUS FAECALIS   Klebsiella pneumoniae - MIC*    AMPICILLIN >=32  RESISTANT Resistant     CEFAZOLIN <=4 SENSITIVE Sensitive     CEFEPIME <=0.12 SENSITIVE Sensitive     CEFTAZIDIME <=1 SENSITIVE Sensitive     CEFTRIAXONE <=0.25 SENSITIVE Sensitive     CIPROFLOXACIN <=0.25 SENSITIVE Sensitive     GENTAMICIN <=1 SENSITIVE Sensitive     IMIPENEM <=0.25 SENSITIVE Sensitive     TRIMETH/SULFA <=20 SENSITIVE Sensitive     AMPICILLIN/SULBACTAM 8 SENSITIVE Sensitive     PIP/TAZO <=4 SENSITIVE Sensitive     * KLEBSIELLA PNEUMONIAE  Blood Culture ID Panel (Reflexed)     Status: Abnormal   Collection Time: 05/31/20  6:37 PM  Result Value Ref Range Status   Enterococcus faecalis DETECTED (A) NOT DETECTED Final    Comment: CRITICAL RESULT CALLED TO, READ BACK BY AND VERIFIED WITH: J LEDFORD [facility] 06/02/20 0209 JDW    Enterococcus Faecium NOT DETECTED NOT DETECTED Final   Listeria monocytogenes NOT DETECTED NOT DETECTED Final   Staphylococcus species NOT DETECTED NOT DETECTED Final   Staphylococcus aureus (BCID) NOT DETECTED NOT DETECTED Final   Staphylococcus epidermidis NOT DETECTED NOT DETECTED Final   Staphylococcus lugdunensis NOT DETECTED NOT DETECTED Final   Streptococcus species NOT DETECTED NOT DETECTED Final   Streptococcus agalactiae NOT DETECTED NOT DETECTED Final   Streptococcus pneumoniae NOT DETECTED NOT DETECTED Final   Streptococcus pyogenes NOT DETECTED NOT DETECTED Final   A.calcoaceticus-baumannii NOT DETECTED NOT DETECTED Final   Bacteroides fragilis NOT DETECTED NOT DETECTED Final   Enterobacterales DETECTED (A) NOT DETECTED Final    Comment: Enterobacterales represent a large order of gram negative bacteria, not a single organism. CRITICAL RESULT CALLED TO, READ  BACK BY AND VERIFIED WITH: J LEDFORD [facility] 06/02/20 0209 JDW    Enterobacter cloacae complex NOT DETECTED NOT DETECTED Final   Escherichia coli NOT DETECTED NOT DETECTED Final   Klebsiella aerogenes NOT DETECTED NOT DETECTED Final   Klebsiella oxytoca NOT DETECTED NOT DETECTED Final   Klebsiella pneumoniae DETECTED (A) NOT DETECTED Final    Comment: CRITICAL RESULT CALLED TO, READ BACK BY AND VERIFIED WITH: J LEDFORD PHARMD 06/02/20 0209 JDW    Proteus species NOT DETECTED NOT DETECTED Final   Salmonella species NOT DETECTED NOT DETECTED Final   Serratia marcescens NOT DETECTED NOT DETECTED Final   Haemophilus influenzae NOT DETECTED NOT DETECTED Final   Neisseria meningitidis NOT DETECTED NOT DETECTED Final   Pseudomonas aeruginosa NOT DETECTED NOT DETECTED Final   Stenotrophomonas maltophilia NOT DETECTED NOT DETECTED Final   Candida albicans NOT DETECTED NOT DETECTED Final   Candida auris NOT DETECTED NOT DETECTED Final   Candida glabrata NOT DETECTED NOT DETECTED Final   Candida krusei NOT DETECTED NOT DETECTED Final   Candida parapsilosis NOT DETECTED NOT DETECTED Final   Candida tropicalis NOT DETECTED NOT DETECTED Final   Cryptococcus neoformans/gattii NOT DETECTED NOT DETECTED Final   CTX-M ESBL NOT DETECTED NOT DETECTED Final   Carbapenem resistance IMP NOT DETECTED NOT DETECTED Final   Carbapenem resistance KPC NOT DETECTED NOT DETECTED Final   Carbapenem resistance NDM NOT DETECTED NOT DETECTED Final   Carbapenem resist OXA 48 LIKE NOT DETECTED NOT DETECTED Final   Vancomycin resistance NOT DETECTED NOT DETECTED Final   Carbapenem resistance VIM NOT DETECTED NOT DETECTED Final    Comment: Performed at [facility], [address] [place], [place] [address]  Culture, blood (single)     Status: None   Collection Time: 06/02/20  9:18 AM   Specimen: BLOOD LEFT ARM  Result Value Ref Range Status   Specimen Description BLOOD LEFT ARM  Final   Special Requests   Final      BOTTLES DRAWN AEROBIC AND ANAEROBIC Blood Culture adequate volume   Culture   Final    NO GROWTH 5 DAYS Performed at [facility], [address], [place], [place] [address]    Report Status 06/07/2020 FINAL  Final  Culture, blood (single)     Status: None   Collection Time: 06/02/20 10:26 AM   Specimen: BLOOD  Result Value Ref Range Status   Specimen Description BLOOD SITE NOT SPECIFIED  Final   Special Requests   Final    BOTTLES DRAWN AEROBIC AND ANAEROBIC Blood Culture adequate volume   Culture   Final    NO GROWTH 5 DAYS Performed at [facility], [address] [place], [place] [address]    Report Status 06/07/2020 FINAL  Final     Terri Piedra, NP [facility]  06/07/2020  11:27 AM

## 2020-06-07 NOTE — Progress Notes (Signed)
Subjective: Oriented this morning. Trophic NJ feeds running. NG has been removed, patient has reported nausea to RN. Had a BM yesterday. Gallbladder fossa drain is bilious this morning and had increased output overnight.  Objective: Vital signs in last 24 hours: Temp:  [98.3 F (36.8 C)-100.5 F (38.1 C)] 98.5 F (36.9 C) (09/30 0400) Pulse Rate:  [92-120] 113 (09/30 0700) Resp:  [19-39] 28 (09/30 0700) BP: (109-146)/(71-92) 129/81 (09/30 0700) SpO2:  [89 %-100 %] 95 % (09/30 0700) Weight:  [88.9 kg] 88.9 kg (09/30 0427) Last BM Date: 06/06/20  Intake/Output from previous day: 09/29 0701 - 09/30 0700 In: 3078.6 [I.V.:2246.3; NG/GT:262.3; IV Piggyback:550] Out: 2360 [Urine:1270; Emesis/NG output:350; Drains:740] Intake/Output this shift: No intake/output data recorded.  PE: General: NAD HEENT: NJ feeding tube in place with feeds running at 20 ml/hr. Resp: normal work of breathing, on nasal cannula  Abdomen: distended but soft and minimally tender, RUQ PBD with bilious fluid in bag. Percutaneous drain with bilious fluid. Extremities: bilateral lower extremity edema  Lab Results:  Recent Labs    06/06/20 0455 06/07/20 0423  WBC 17.7* 21.4*  HGB 8.0* 8.5*  HCT 27.6* 29.4*  PLT 258 286   BMET Recent Labs    06/06/20 0455 06/07/20 0423  NA 140 142  K 3.9 4.4  CL 111 112*  CO2 21* 21*  GLUCOSE 150* 183*  BUN 29* 33*  CREATININE 0.69 0.72  CALCIUM 8.6* 8.7*   PT/INR No results for input(s): LABPROT, INR in the last 72 hours. CMP     Component Value Date/Time   NA 142 06/07/2020 0423   K 4.4 06/07/2020 0423   CL 112 (H) 06/07/2020 0423   CO2 21 (L) 06/07/2020 0423   GLUCOSE 183 (H) 06/07/2020 0423   BUN 33 (H) 06/07/2020 0423   CREATININE 0.72 06/07/2020 0423   CALCIUM 8.7 (L) 06/07/2020 0423   PROT 6.1 (L) 06/07/2020 0423   ALBUMIN 2.0 (L) 06/07/2020 0423   AST 32 06/07/2020 0423   ALT 25 06/07/2020 0423   ALKPHOS 268 (H) 06/07/2020 0423   BILITOT  1.0 06/07/2020 0423   GFRNONAA >60 06/07/2020 0423   GFRAA >60 06/07/2020 0423   Lipase     Component Value Date/Time   LIPASE 27 05/16/2020 1905       Studies/Results: DG Abd 1 View  Result Date: 06/06/2020 CLINICAL DATA:  Check feeding catheter placement EXAM: ABDOMEN - 1 VIEW COMPARISON:  Film from earlier in the same day. FINDINGS: Gastric catheter has been removed. New weighted feeding catheter has been placed and extends into the fourth portion of the duodenum near the proximal jejunum. Previous cholecystostomy tube and biliary drain are again seen and stable. No obstructive changes are seen. IMPRESSION: Feeding tube in satisfactory position in the distal duodenum. Electronically Signed   By: Inez Catalina M.D.   On: 06/06/2020 12:39   DG Abd Portable 1V  Result Date: 06/06/2020 CLINICAL DATA:  NG tube placement EXAM: PORTABLE ABDOMEN - 1 VIEW COMPARISON:  Radiograph 06/05/2020 FINDINGS: Transesophageal tube coiling in the left upper quadrant terminating in the region of the gastric body with side port distal to the GE junction. An internal/external biliary drain is again seen traversing a biliary stent as well as a pigtail percutaneous drain in the right upper quadrant. No high-grade obstructive bowel gas pattern. No suspicious calcifications within the abdomen. Persistent basilar atelectatic changes and small left pleural effusion are noted. Degenerative changes in the spine, hips and pelvis. IMPRESSION:  1. Transesophageal tube coiling in the left upper quadrant, terminating in the gastric body with side port distal to the GE junction. Electronically Signed   By: Lovena Le M.D.   On: 06/06/2020 00:56    Anti-infectives: Anti-infectives (From admission, onward)   Start     Dose/Rate Route Frequency Ordered Stop   06/04/20 1400  Ampicillin-Sulbactam (UNASYN) 3 g in sodium chloride 0.9 % 100 mL IVPB        3 g 200 mL/hr over 30 Minutes Intravenous Every 6 hours 06/04/20 0906      06/02/20 0300  meropenem (MERREM) 2 g in sodium chloride 0.9 % 100 mL IVPB  Status:  Discontinued        2 g 200 mL/hr over 30 Minutes Intravenous Every 8 hours 06/02/20 0248 06/04/20 0906   06/02/20 0300  vancomycin (VANCOCIN) IVPB 1000 mg/200 mL premix  Status:  Discontinued        1,000 mg 200 mL/hr over 60 Minutes Intravenous Every 12 hours 06/02/20 0248 06/04/20 0906   06/01/20 1000  piperacillin-tazobactam (ZOSYN) IVPB 3.375 g  Status:  Discontinued        3.375 g 12.5 mL/hr over 240 Minutes Intravenous Every 8 hours 06/01/20 0952 06/02/20 0248   05/29/20 0800  piperacillin-tazobactam (ZOSYN) IVPB 3.375 g        3.375 g 12.5 mL/hr over 240 Minutes Intravenous To Radiology 05/28/20 1328 05/29/20 1943   05/04/20 1230  meropenem (MERREM) 1 g in sodium chloride 0.9 % 100 mL IVPB  Status:  Discontinued        1 g 200 mL/hr over 30 Minutes Intravenous Every 8 hours 05/04/20 1156 05/19/20 0826   05/01/20 1400  piperacillin-tazobactam (ZOSYN) IVPB 3.375 g        3.375 g 12.5 mL/hr over 240 Minutes Intravenous Every 8 hours 05/01/20 1013 05/03/20 0907   04/29/20 2000  vancomycin (VANCOCIN) IVPB 1000 mg/200 mL premix  Status:  Discontinued        1,000 mg 200 mL/hr over 60 Minutes Intravenous Every 12 hours 04/29/20 0653 04/30/20 0831   04/29/20 0745  vancomycin (VANCOREADY) IVPB 1500 mg/300 mL        1,500 mg 150 mL/hr over 120 Minutes Intravenous  Once 04/29/20 0653 04/29/20 1129   04/26/20 2300  piperacillin-tazobactam (ZOSYN) IVPB 3.375 g        3.375 g 12.5 mL/hr over 240 Minutes Intravenous Every 8 hours 04/26/20 1716 05/01/20 0559   04/26/20 1700  piperacillin-tazobactam (ZOSYN) IVPB 3.375 g        3.375 g 100 mL/hr over 30 Minutes Intravenous  Once 04/26/20 1655 04/26/20 1844       Assessment/Plan 64 yo male s/p subtotal cholecystectomy, complicated by persistent bile leak, 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 S/p PTBD  placement by IR. - PTBD does not seem to be functioning well as gallbladder drain is bilious again. Consult IR today for tube cholangiogram and possible upsize of drain. - Antibiotics for bacteremia - Trend LFTs daily - Remain NPO with TPN, continue trophic NJ feeds but replace NG tube to keep the stomach decompressed. - Plan for repeat CT scan in the next few days to assess peripancreatic fluid/necrosis - Surgery will continue to follow closely   LOS: 41 days    Michaelle Birks, MD [facility] General, Hepatobiliary and Pancreatic Surgery 06/07/20  7:22 AM

## 2020-06-07 NOTE — Procedures (Signed)
Interventional Radiology Procedure Note  Procedure:   Injection of GB fossa drain, with exchange for 60F drain Injection of int/ext drain, with exchange/upsize for 60F drain.   Findings: The 59F int/ext drain was occluded, and so was upsized to 60F int/ext drain.   The GB fossa drain injection shows patent cystic duct remnant communicating with the stented CBD. There is contrast accumulating in the RP space, likely related to the fluid from necrotizing pancreatitis.   No clear communication/fistula of the GB fossa drain to the stomach/SB/duodenum.    Complications: None Recommendations:  - continue drain care. The goal of drain care is to keep the skin dry.  PRN dry dressing changes are needed at the drain insertion sites.  tegaderm in not needed.  - Do not submerge    Signed,  Dulcy Fanny. Earleen Newport, DO

## 2020-06-07 NOTE — Progress Notes (Signed)
[facility] Progress Note  CC:  Post-ERCP pancreatitis, pseudocysts  Subjective:  Tolerating tube feeds through Cortrack so far.  Going for drain/tube check with IR today.  Objective:  Vital signs in last 24 hours: Temp:  [98.3 F (36.8 C)-100.5 F (38.1 C)] 98.8 F (37.1 C) (09/30 0800) Pulse Rate:  [92-120] 113 (09/30 1000) Resp:  [19-33] 33 (09/30 1000) BP: (109-146)/(71-92) 129/83 (09/30 1000) SpO2:  [93 %-100 %] 94 % (09/30 1000) Weight:  [88.9 kg] 88.9 kg (09/30 0427) Last BM Date: 06/06/20 General:  Alert, ill-appearing, in NAD Heart:  Tachy.  No murmurs noted. Pulm:  CTAB.  No W/R/R. Abdomen:  Soft, firm.  BS present.  Upper abdominal TTP. Extremities:  Without edema.  Intake/Output from previous day: 09/29 0701 - 09/30 0700 In: 3078.6 [I.V.:2246.3; NG/GT:262.3; IV Piggyback:550] Out: 2360 [Urine:1270; Emesis/NG output:350; Drains:740] Intake/Output this shift: Total I/O In: 459.8 [I.V.:299.9; NG/GT:60; IV Piggyback:99.9] Out: -   Lab Results: Recent Labs    06/05/20 0403 06/06/20 0455 06/07/20 0423  WBC 18.2* 17.7* 21.4*  HGB 8.5* 8.0* 8.5*  HCT 28.5* 27.6* 29.4*  PLT 267 258 286   BMET Recent Labs    06/05/20 0403 06/06/20 0455 06/07/20 0423  NA 140 140 142  K 3.7 3.9 4.4  CL 112* 111 112*  CO2 20* 21* 21*  GLUCOSE 179* 150* 183*  BUN 34* 29* 33*  CREATININE 0.73 0.69 0.72  CALCIUM 8.4* 8.6* 8.7*   LFT Recent Labs    06/07/20 0423  PROT 6.1*  ALBUMIN 2.0*  AST 32  ALT 25  ALKPHOS 268*  BILITOT 1.0   DG Abd 1 View  Result Date: 06/06/2020 CLINICAL DATA:  Check feeding catheter placement EXAM: ABDOMEN - 1 VIEW COMPARISON:  Film from earlier in the same day. FINDINGS: Gastric catheter has been removed. New weighted feeding catheter has been placed and extends into the fourth portion of the duodenum near the proximal jejunum. Previous cholecystostomy tube and biliary drain are again seen and stable. No obstructive  changes are seen. IMPRESSION: Feeding tube in satisfactory position in the distal duodenum. Electronically Signed   By: Inez Catalina M.D.   On: 06/06/2020 12:39   DG Abd Portable 1V  Result Date: 06/06/2020 CLINICAL DATA:  NG tube placement EXAM: PORTABLE ABDOMEN - 1 VIEW COMPARISON:  Radiograph 06/05/2020 FINDINGS: Transesophageal tube coiling in the left upper quadrant terminating in the region of the gastric body with side port distal to the GE junction. An internal/external biliary drain is again seen traversing a biliary stent as well as a pigtail percutaneous drain in the right upper quadrant. No high-grade obstructive bowel gas pattern. No suspicious calcifications within the abdomen. Persistent basilar atelectatic changes and small left pleural effusion are noted. Degenerative changes in the spine, hips and pelvis. IMPRESSION: 1. Transesophageal tube coiling in the left upper quadrant, terminating in the gastric body with side port distal to the GE junction. Electronically Signed   By: Lovena Le M.D.   On: 06/06/2020 00:56   Assessment / Plan: 64 y.o.malewithpersistent bile leak following necrotic GB surgery. He has severe post ERCP pancreatitis with evolving large peripancreatic fluid collections, perhaps walled off necrosiscausing GOO. Biliary stent and one PTBD in place and one percutaneous drain that lays in his GB fossa site. RNs felt that when meds have been put into his NG that they are very quickly visible in the GB fossa site drain.  Thought possible to be some retrograde reflux of  material but drain check was ok.  Overall his is stable.  Continue TNA.  Cortrack has been placed and he is tolerating those feeds so far.  IR today for tube cholangiogram and possible upsize of drain.  Agree with repeat CT on Friday to assess the fluid collections, they may be maturing to the point of safe attempt endoscopic cyst-gastrostomy.  Will place order.   LOS: 41 days   Laban Emperor.  Quinto Tippy  06/07/2020, 11:40 AM

## 2020-06-07 NOTE — Progress Notes (Signed)
NAME:  Adam Fowler, MRN:  [id number], DOB:  Aug 06, 1956, LOS: 70 ADMISSION DATE:  04/26/2020, CONSULTATION DATE:  9/23 REFERRING MD:  Wyline Copas, CHIEF COMPLAINT:  Hypotension   Brief History   64 yo M s/p subtotal cholecystectomy c/b persistent bile leak and biliary fistula, post ERCP necrotizing pancreatitis-- developed hypotension 9/23 and CCM consulted   Past Medical History  Kidney stones HTN Insomnia S/p cholecystectomy and abscess  Pancreatitis   Significant Hospital Events   8/20 admit 8/21-24 admitted 9/21 perc bilary drainage  9/23 drain capped. Acute decompensation in afternoon. Fluid resuscitation, starting on pressors and transferring to ICU  9/27 started fentanyl infusion, improvement in pain control; concern for pill fragments in GB fossa drain 9/28 placed blue food coloring in NG tube, not seen in GB fossa drain 9/29 started trophic tube feedings  Consults:  PCCM GI CCS IR  Procedures:  9/3 GB fossa drain 9/21 cholangiogram and biliary drain placement  Significant Diagnostic Tests:  9/18 ct a/p w/con> moderate peripancreatic fluid extending into gastrohepatic ligament. Possible pseudocyst. Pigtail drainage catheter tip in region of gallbladder fossa. CBD stent. Persistent bibasilar opacities. L pleural effusion. Moderate ascites. Colonic diverticulosis  9/23 KUB>> cholecystostomy tube & internal/external biliary drain noted with a CBD stent in place, gastric tube within the stomach. Scattered large & small bowel gas pattern noted  9/23 ct a/p w con>>>again noted are findings of acute pancreatitis with relatively stable peripancreatic fluid collections.  Findings consistent with a gastric ileus. The esophagus is dilated and fluid-filled to at least the mid thorax. This patient would likely benefit from NG tube decompression. Worsening airspace opacities at the lung bases concerning for developing infiltrates or aspiration. Moderate-sized left-sided pleural effusion  with at least partial collapse of the left lower lobe. There is a trace to small right-sided pleural effusion with mild adjacent atelectasis.  Stable small volume abdominal ascites.  Well-positioned internal/external biliary drain. Unchanged cholecystostomy tube.   Micro Data:  8/19 SARS COV 2 > neg 8/19 blood > neg 9/10 body fluid culture > neg 9/23 blood > enterococcus faecalis, klebsiella 9/25 blood > NGTD  Antimicrobials:  9/21 zosyn x1 9/24 mero > 9/26 9/24 zosyn x1 9/24 vanc > 9/26 9/27 unasyn >   Interim history/subjective:   Belly feels OK GB fossa drain is bilious again Anxiety attacks  Objective   Blood pressure 129/81, pulse (!) 113, temperature 98.5 F (36.9 C), temperature source Axillary, resp. rate (!) 28, height 5\' 9"  (1.753 m), weight 88.9 kg, SpO2 95 %.        Intake/Output Summary (Last 24 hours) at 06/07/2020 0741 Last data filed at 06/07/2020 0700 Gross per 24 hour  Intake 3078.59 ml  Output 2360 ml  Net 718.59 ml   Filed Weights   06/03/20 0500 06/04/20 0500 06/07/20 0427  Weight: 86.3 kg 88.9 kg 88.9 kg    Examination:  General: Frail, anxious, resting in bed HENT: NCAT OP clear PULM: CTA B, normal effort CV: RRR, no mgr GI: distended, some bowl sounds, mildly tender, bilious drainage around GB fossa drain MSK: normal bulk and tone Neuro: awake, alert, no distress, MAEW    Resolved Hospital Problem list     Assessment & Plan:  Persistent bile leak after cholecystectomy with 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, s/p ERCP who has necrotizing pancreatitis with perihepatic fluid collections GB fossa and biliary drains in place Biliary drain malfunction> bilious fluid from fossa drain again Trophic tube feedings NPO TPN IR to replace/upsize biliary drain  Bacteremia in setting of peritonitis, enterococcal and klebsiella Septic shock > resolved Antibiotics per ID Will need TEE prior to hospital discharge  Acute hypoxemic  respiratory failure  > resolved Minimize sedation  Esophagitis BID PPI  Acute metabolic encephalopathy : improved Panic Prn ativan Fentanyl patch and IV narcotics> per palliative Consider clonazepam  Insomnia Ambien at night  Depression celexa  Hypertension monitor  Hypercholesterolemia Restart pravastatin   Best practice:  Diet: TPN Pain/Anxiety/Delirium protocol (if indicated): n/a VAP protocol (if indicated): n/a DVT prophylaxis: lovenox GI prophylaxis: BID protonix Glucose control: SSI Mobility: out of bed as able Code Status: full Family Communication: wife updated 9/30 baseline Disposition:   Labs   CBC: Recent Labs  Lab 06/02/20 1026 06/04/20 0538 06/05/20 0403 06/06/20 0455 06/07/20 0423  WBC 18.5* 19.0* 18.2* 17.7* 21.4*  NEUTROABS 16.0* 17.3* 15.8* 13.9* 18.2*  HGB 7.7* 8.5* 8.5* 8.0* 8.5*  HCT 26.8* 29.9* 28.5* 27.6* 29.4*  MCV 93.7 91.7 90.2 89.9 91.3  PLT 250 258 267 258 712    Basic Metabolic Panel: Recent Labs  Lab 06/02/20 0550 06/02/20 0550 06/03/20 0528 06/04/20 0538 06/05/20 0403 06/06/20 0455 06/07/20 0423  NA 146*   < > 143 141 140 140 142  K 3.7   < > 3.3* 3.6 3.7 3.9 4.4  CL 116*   < > 116* 112* 112* 111 112*  CO2 21*   < > 21* 21* 20* 21* 21*  GLUCOSE 228*   < > 159* 123* 179* 150* 183*  BUN 48*   < > 40* 38* 34* 29* 33*  CREATININE 0.90   < > 0.70 0.77 0.73 0.69 0.72  CALCIUM 8.4*   < > 8.3* 8.5* 8.4* 8.6* 8.7*  MG 2.3  --  2.0 2.1  --   --  2.2  PHOS 2.7  --  2.7 2.7  --   --  3.6   < > = values in this interval not displayed.   GFR: Estimated Creatinine Clearance: 102.9 mL/min (by C-G formula based on SCr of 0.72 mg/dL). Recent Labs  Lab 05/31/20 1327 05/31/20 1834 06/01/20 0809 06/01/20 0856 06/01/20 1157 06/02/20 0550 06/02/20 1026 06/03/20 0528 06/04/20 0538 06/05/20 0403 06/06/20 0455 06/07/20 0423  PROCALCITON  --   --  30.80  --   --  16.76  --  8.94  --   --   --   --   WBC   < >  --  42.2*   --   --   --    < >  --  19.0* 18.2* 17.7* 21.4*  LATICACIDVEN  --  5.3* 2.9* 3.5* 2.8*  --   --   --   --   --   --   --    < > = values in this interval not displayed.    Liver Function Tests: Recent Labs  Lab 06/02/20 0550 06/04/20 0538 06/05/20 0403 06/06/20 0455 06/07/20 0423  AST 27 31 27 22  32  ALT 30 31 27 23 25   ALKPHOS 246* 257* 210* 187* 268*  BILITOT 1.1 0.9 0.9 0.9 1.0  PROT 5.6* 5.7* 5.6* 5.8* 6.1*  ALBUMIN 1.5* 1.4* 1.8* 2.1* 2.0*   No results for input(s): LIPASE, AMYLASE in the last 168 hours. No results for input(s): AMMONIA in the last 168 hours.  ABG    Component Value Date/Time   PHART 7.295 (L) 05/31/2020 1625   PCO2ART 38.6 05/31/2020 1625   PO2ART 62.0 (L) 05/31/2020 1625  HCO3 18.0 (L) 05/31/2020 1625   TCO2 26 04/30/2020 0241   ACIDBASEDEF 7.1 (H) 05/31/2020 1625   O2SAT 87.5 05/31/2020 1625     Coagulation Profile: Recent Labs  Lab 05/31/20 1415  INR 1.5*    Cardiac Enzymes: No results for input(s): CKTOTAL, CKMB, CKMBINDEX, TROPONINI in the last 168 hours.  HbA1C: Hgb A1c MFr Bld  Date/Time Value Ref Range Status  04/29/2020 02:02 AM 5.6 4.8 - 5.6 % Final    Comment:    (NOTE) Pre diabetes:          5.7%-6.4%  Diabetes:              >6.4%  Glycemic control for   <7.0% adults with diabetes   02/13/2020 04:55 PM 6.0 (H) 4.8 - 5.6 % Final    Comment:    (NOTE) Pre diabetes:          5.7%-6.4% Diabetes:              >6.4% Glycemic control for   <7.0% adults with diabetes     CBG: Recent Labs  Lab 06/06/20 1151 06/06/20 1627 06/06/20 1955 06/06/20 2331 06/07/20 0332  GLUCAP 120* 155* 141* 138* 156*     Critical care time: 35 minutes    Roselie Awkward, MD [place] [facility] Pager: [phone] Cell: [phone] If no response, call [phone]

## 2020-06-07 NOTE — Progress Notes (Addendum)
PHARMACY - TOTAL PARENTERAL NUTRITION CONSULT NOTE  Indication:  Severe pancreatitis, biliary fistula, GOO  Patient Measurements: Height: 5' 9"  (175.3 cm) Weight: 88.9 kg (195 lb 15.8 oz) IBW/kg (Calculated) : 70.7 TPN AdjBW (KG): 86.1 Body mass index is 28.94 kg/m.  Assessment:  89 YOM admitted 04/26/20, post-ERCP 8/18 and biliary stent exchange for persistent postoperative bile leak related to recent cholecystectomy with abscess s/p percutaneous drainage. Patient developed acute pancreatitis, has had a prolonged hospitalization complicated by VDRF (extubated 8/24), AKI, encephalopathy, and afib. TPN initiated 9/11 for acute post-ERCP pancreatitis/ileus and persistent biliary leak. Significant gastric fluid retention from functional outlet obstruction. Patient acutely decompensated 9/23 resulted in pressors, non-rebreather and septic shock.     Glucose / Insulin: A1c 5.6% - CBGs <180. One outlier: 183 Required 13 units rSSI + 35 units in TPN.  Electrolytes: K 4.4 w KCl x2 runs(goal >/= 4 for ileus), Cl 112 high-normal. CO2 21 low. (max acetate in TPN), CoCa up to 10.3 Renal: SCr < 1, BUN down 33 LFTs / TGs: LFTs WNL except alk phos up to 268, tbili WNL, TG mildly elevated 180 Prealbumin / albumin: Prealbumin 8.2 (stable). Albumin 2.0 Intake / Output; MIVF: UOP 0.6 mL/kg/hr (down), NG up to 329m, drain down to 74103m net -1.8L (since 9/16).  LBM 9/28.    GI Imaging:  9/5: CT - severe pancreatitis 9/7: HIDA - persistent bile leak with accumulations/paracolic gutter 9/9: CT - pancreatitis (peripancreatic fat stranding and peripancreatic fluid, percutaneous drain at the gallbladder fossa, unchanged biliary stent with no intrahepatic biliary duct dilatation, bilateral pleural effusions 9/15: KUB - diffuse colonic stool, no bowel obstruction/free air 9/18: CT - mild colonic diverticulosis, pseudocyst involvement and less likely abscess 9/23: KUB - no evidence of bowel obstruction/ileus 9/23:  CT - Gastric ileus, dilated/fluid-filled esophagus, small volume abd ascites 9/26: KUB - Suspect ascites and diffuse body wall edema Surgeries / Procedures: 8/18: ERCP/biliary stent exchange 9/9: Unsuccessful ERCP 9/10: Paracentesis - 1.4L out 9/21: EGD- Esophagitis w/ active oozing and stigmata of recent bleeding, gastritis w/ hemorrhage, congested duodenal mucosa; NGT trauma  9/21: Paracentesis/biliary drain placement/cholangiogram/drain check- 3L removed  Central access: PICC placed 05/19/20 TPN start date: 05/19/20  Nutritional Goals (per RD rec on 05/18/20): kCal: 2300-2500, Protein: 130-145g, Fluid: >2.3L/day  Current Nutrition: TPN 9/29- Trickle feeds initiated: Vital 1.5 at 2027mr (goal rate of 23m23m) -provides 2340 kcal, 105g AA, 84g ILE, 292g CHO at goal.  Pro-Source 45mL21m -provides 132 kcal, 33g AA total  Plan:  Continue TPN at goal rate of 100mL/30mproviding 144g AA, 336g CHO and 67g ILE for a total of 2390 kCal, meeting 100% of needs  Electrolytes in TPN: Na 30mEq/99m 45mEq/L63mg 4mEq/L, 23ms 7mmol/L, 100m3mEq/L, ma62mcetate - no change today Add standard MVI and trace elements to TPN  Continue resistant SSI Q4H + 35 units regular insulin in TPN  F/u tolerance to TF, repeat labs on Sat  Sarah RickeRedgie Grayerndidate 06/07/2020, 7:04 AM  Oriel Ojo D. DanRemigio EisenmengerharmMina MarbleCPS, BCCCP 9/30/[place] 9:58 AM

## 2020-06-08 ENCOUNTER — Inpatient Hospital Stay (HOSPITAL_COMMUNITY): Payer: 59

## 2020-06-08 DIAGNOSIS — K8512 Biliary acute pancreatitis with infected necrosis: Secondary | ICD-10-CM

## 2020-06-08 LAB — GLUCOSE, CAPILLARY
Glucose-Capillary: 170 mg/dL — ABNORMAL HIGH (ref 70–99)
Glucose-Capillary: 160 mg/dL — ABNORMAL HIGH (ref 70–99)
Glucose-Capillary: 182 mg/dL — ABNORMAL HIGH (ref 70–99)
Glucose-Capillary: 148 mg/dL — ABNORMAL HIGH (ref 70–99)
Glucose-Capillary: 150 mg/dL — ABNORMAL HIGH (ref 70–99)
Glucose-Capillary: 155 mg/dL — ABNORMAL HIGH (ref 70–99)

## 2020-06-08 MED ORDER — IOHEXOL 300 MG/ML  SOLN
100.0000 mL | Freq: Once | INTRAMUSCULAR | Status: AC | PRN
  Administered 2020-06-08: 100 mL via INTRAVENOUS

## 2020-06-08 MED ORDER — CLONAZEPAM 0.25 MG PO TBDP
0.2500 mg | ORAL_TABLET | Freq: Two times a day (BID) | ORAL | Status: DC
  Administered 2020-06-08 – 2020-06-14 (×13): 0.25 mg
  Filled 2020-06-08 (×13): qty 1

## 2020-06-08 MED ORDER — VITAL 1.5 CAL PO LIQD
1000.0000 mL | ORAL | Status: DC
  Filled 2020-06-08 (×2): qty 1000

## 2020-06-08 MED ORDER — TRAVASOL 10 % IV SOLN
INTRAVENOUS | Status: AC
  Filled 2020-06-08 (×2): qty 1440

## 2020-06-08 MED ORDER — PHENAZOPYRIDINE HCL 100 MG PO TABS
100.0000 mg | ORAL_TABLET | Freq: Three times a day (TID) | ORAL | Status: DC | PRN
  Filled 2020-06-08: qty 1

## 2020-06-08 NOTE — Progress Notes (Signed)
PHARMACY - TOTAL PARENTERAL NUTRITION CONSULT NOTE  Indication:  Severe pancreatitis, biliary fistula, GOO  Patient Measurements: Height: 5' 9"  (175.3 cm) Weight: 87.6 kg (193 lb 2 oz) IBW/kg (Calculated) : 70.7 TPN AdjBW (KG): 86.1 Body mass index is 28.52 kg/m.  Assessment:  52 YOM admitted 04/26/20, post-ERCP 8/18 and biliary stent exchange for persistent postoperative bile leak related to recent cholecystectomy with abscess s/p percutaneous drainage. Patient developed acute pancreatitis, has had a prolonged hospitalization complicated by VDRF (extubated 8/24), AKI, encephalopathy, and afib. TPN initiated 9/11 for acute post-ERCP pancreatitis/ileus and persistent biliary leak. Significant gastric fluid retention from functional outlet obstruction. Patient acutely decompensated 9/23 resulted in pressors, non-rebreather and septic shock.     Glucose / Insulin: A1c 5.6% - CBGs <180. Required 23 units rSSI + 35 units in TPN.  Electrolytes: K 4.4 on 9/30 (goal >/= 4 for ileus), Cl 112 high-normal. CO2 21 low. (max acetate in TPN), CoCa up to 10.3 Renal: SCr < 1, BUN down 33 LFTs / TGs: LFTs WNL except alk phos up to 268, tbili WNL, TG mildly elevated 180 Prealbumin / albumin: Prealbumin 8.2 (stable). Albumin 2.0 Intake / Output; MIVF: UOP 0.5 mL/kg/hr (down), NG up to 570m, drain down to 4932m net -1.1L (since 9/17).  LBM 9/29.    GI Imaging:  9/5: CT - severe pancreatitis 9/7: HIDA - persistent bile leak with accumulations/paracolic gutter 9/9: CT - pancreatitis (peripancreatic fat stranding and peripancreatic fluid, percutaneous drain at the gallbladder fossa, unchanged biliary stent with no intrahepatic biliary duct dilatation, bilateral pleural effusions 9/15: KUB - diffuse colonic stool, no bowel obstruction/free air 9/18: CT - mild colonic diverticulosis, pseudocyst involvement and less likely abscess 9/23: KUB - no evidence of bowel obstruction/ileus 9/23: CT - Gastric ileus,  dilated/fluid-filled esophagus, small volume abd ascites 9/26: KUB - Suspect ascites and diffuse body wall edema 10/1: CT - pending Surgeries / Procedures: 8/18: ERCP/biliary stent exchange 9/9: Unsuccessful ERCP 9/10: Paracentesis - 1.4L out 9/21: EGD- Esophagitis w/ active oozing and stigmata of recent bleeding, gastritis w/ hemorrhage, congested duodenal mucosa; NGT trauma  9/21: Paracentesis/biliary drain placement/cholangiogram/drain check- 3L removed 9/30: GB fossa drain occluded, exchanged and upsized to 15F  Central access: PICC placed 05/19/20 TPN start date: 05/19/20  Nutritional Goals (per RD rec on 05/18/20): kCal: 2300-2500, Protein: 130-145g, Fluid: >2.3L/day  Current Nutrition: TPN 9/29- Trickle feeds initiated: Vital 1.5 at 2070mr (goal rate of 7m71m) -provides 2340 kcal, 105g AA, 84g ILE, 292g CHO at goal.   Plan:  Continue TPN at goal rate of 100mL100m providing 144g AA, 336g CHO and 67g ILE for a total of 2390 kCal, meeting 100% of needs  Electrolytes in TPN: Na 30mEq2mK 45mEq/17mag 4mEq/L,70mos 7mmol/L,66m 3mEq/L, m45macetate - no change today Add standard MVI and trace elements to TPN  Continue resistant SSI Q4H + 35 units regular insulin in TPN  F/u tolerance to TF, repeat labs tomorrow  Thank you for involving pharmacy in this patient's care.  Bernadett Milian DRenold GentaBCPS Clinical Pharmacist Clinical phone for 06/08/2020 until 3p is [phone] [id number] 7:26 AM  **Pharmacist phone directory can be found on [email or web address] [place] [facility]

## 2020-06-08 NOTE — Progress Notes (Signed)
Physical Therapy Treatment Patient Details Name: Adam Fowler MRN: [id number] DOB: 1956-03-16 Today's Date: 06/08/2020    History of Present Illness Pt is a 64 yo male who presents to the ED with severe abdominal pain, nausea and vomiting. Found to have post ERCP acute pancreatits. Pt developed Hypoxic resp failure and was Intubated 8/22  and extubated 8/24. Pt also with new onset a fib and acute ileus. PMH includes HTN and ERCP.  Pt with multiple drain placements during admission and having difficulty with pain management.    PT Comments    Pt limited by pain during session, moaning often and appearing very distressed. Pt remains weak and requires physical assistance to perform all functional mobility at this time. Pt has poor awareness of lines during session and requires significant cueing to improve safety during transfers. Pt will continue to benefit from acute PT POC to improve activity tolerance and reduce falls risk as long as this continues to align with his goals of care. PT updating recommendations to SNF as pt continues to demonstrate poor activity tolerance.   Follow Up Recommendations  SNF (poor activity tolerance)     Equipment Recommendations  Wheelchair (measurements PT);Wheelchair cushion (measurements PT);Hospital bed    Recommendations for Other Services       Precautions / Restrictions Precautions Precautions: Fall;Other (comment) Precaution Comments: abdominal drain x 2, NG, cortrak Restrictions Weight Bearing Restrictions: No    Mobility  Bed Mobility Overal bed mobility: Needs Assistance Bed Mobility: Supine to Sit     Supine to sit: Min assist;+2 for physical assistance;HOB elevated     General bed mobility comments: cues for initiation, assist for trunk elevation  Transfers Overall transfer level: Needs assistance Equipment used: 2 person hand held assist Transfers: Sit to/from [facility] Sit to Stand: Mod assist;+2 physical  assistance Stand pivot transfers: Mod assist;+2 physical assistance       General transfer comment: PT verbal and tactile cues during transfer for sequencing  Ambulation/Gait                 Stairs             Wheelchair Mobility    Modified Rankin (Stroke Patients Only)       Balance Overall balance assessment: Needs assistance Sitting-balance support: Bilateral upper extremity supported;Feet supported Sitting balance-Leahy Scale: Poor Sitting balance - Comments: minG-minA with BUE support of bed   Standing balance support: Bilateral upper extremity supported Standing balance-Leahy Scale: Poor Standing balance comment: min-modA x2 with BUE hand hold                            Cognition Arousal/Alertness: Awake/alert Behavior During Therapy: Impulsive Overall Cognitive Status: Impaired/Different from baseline Area of Impairment: Attention;Memory;Following commands;Safety/judgement;Awareness;Problem solving                   Current Attention Level: Focused Memory: Decreased recall of precautions;Decreased short-term memory Following Commands: Follows one step commands with increased time Safety/Judgement: Decreased awareness of safety;Decreased awareness of deficits Awareness: Emergent Problem Solving: Slow processing;Requires verbal cues;Requires tactile cues        Exercises      General Comments General comments (skin integrity, edema, etc.): pt on 3L Adam Fowler, tachy into 120s with mobility. Pt moaning throughout session in significant pain      Pertinent Vitals/Pain Pain Assessment: Faces Faces Pain Scale: Hurts worst Pain Location: generalized Pain Descriptors / Indicators: Moaning Pain Intervention(s): Monitored during  session    Home Living                      Prior Function            PT Goals (current goals can now be found in the care plan section) Acute Rehab PT Goals Patient Stated Goal: to reduce  pain Progress towards PT goals: Not progressing toward goals - comment (limited by pain)    Frequency    Min 3X/week      PT Plan Discharge plan needs to be updated    Co-evaluation PT/OT/SLP Co-Evaluation/Treatment: Yes Reason for Co-Treatment: Complexity of the patient's impairments (multi-system involvement);Necessary to address cognition/behavior during functional activity;For patient/therapist safety;To address functional/ADL transfers PT goals addressed during session: Mobility/safety with mobility;Balance;Strengthening/ROM        AM-PAC PT "6 Clicks" Mobility   Outcome Measure  Help needed turning from your back to your side while in a flat bed without using bedrails?: A Little Help needed moving from lying on your back to sitting on the side of a flat bed without using bedrails?: A Little Help needed moving to and from a bed to a chair (including a wheelchair)?: A Lot Help needed standing up from a chair using your arms (e.g., wheelchair or bedside chair)?: A Lot Help needed to walk in hospital room?: A Lot Help needed climbing 3-5 steps with a railing? : Total 6 Click Score: 13    End of Session Equipment Utilized During Treatment: Oxygen Activity Tolerance: Patient limited by pain Patient left: in chair;with call bell/phone within reach;with bed alarm set;with restraints reapplied Nurse Communication: Mobility status PT Visit Diagnosis: Unsteadiness on feet (R26.81);Muscle weakness (generalized) (M62.81);Difficulty in walking, not elsewhere classified (R26.2);Pain Pain - part of body:  (abdomen)     Time: 9379-0240 PT Time Calculation (min) (ACUTE ONLY): 26 min  Charges:  $Therapeutic Activity: 8-22 mins                     Zenaida Niece, PT, DPT Acute Rehabilitation Pager: [phone]    Zenaida Niece 06/08/2020, 1:38 PM

## 2020-06-08 NOTE — Progress Notes (Addendum)
Daily Progress Note   Patient Name: Adam Fowler       Date: 06/08/2020 DOB: July 22, 1956  Age: 64 y.o. MRN#: [id number] Attending Physician: Chesley Mires, MD Primary Care Physician: Melony Overly, MD Admit Date: 04/26/2020  Reason for Consultation/Follow-up: Symptom management and to discuss complex medical decision making related to patient's goals of care  Examined patient at bedside.  He is lethargic, abdomen distended and hard compared toTuesday.  Remains in wrist restraints.  Subjective: Adam Fowler is difficult to understand.  He requests ice chips.  The bedside RN explains that he appeared to aspirate (almost choke) on ice chips earlier today.  Patient complains to RN about burning when he urinates.  This has been going on for at least two days.  Denies pain currently.  Per RN patient wants to get up out of bed to chair.   Stayed in the chair most of the day.   Assessment: Lethargic with hard distended abdomen.   Patient Profile/HPI:  64 y.o. male  with past medical history of HTN, insomnia, kidney stones who was admitted on 04/26/2020 with  post ERCP pancreatitis.  He had developed gangrenous cholecystitis in May and underwent two ERCPs 5/15 and 5/17 (for stent placement).  On 5/18 he had a subtotal cholecystectomy and placement of a JP drain.  On 5/27 he was noted to have a 8.6 cm fluid collection in the gall bladder fossa and on 6/7 this collection had developed air bubbles and a second collection had formed along the right liver edge.  Previous drains were removed a a new drain was placed by IR.  On 7/14 an ERCP was done to place a CBD stent.  On 8/18 he underwent ERCP for FCSEMS placement for persistent bile leak.  On 8/21 - 8/24 he decompensated with respiratory failure (bilateral  pleural effusion), Afib with RVR and required ventilation support for volume overload and aspiration pneumonia.  On 9/9 an ERCP was attempted but unsuccessful due to gastric outlet obstruction and TPN was started on 9/10.  On 9/23 he suffered acute decompensation with abdominal pain and hypotension (63/41, pulse 130s).  Currently he remains in ICU.  He has gradually become more debilitated and has suffered episodes of delirium.  His albumin is currently 1.5.   Length of Stay: 42   Vital Signs: BP  116/72   Pulse 99   Temp 97.8 F (36.6 C) (Axillary)   Resp 18   Ht 5\' 9"  (1.753 m)   Wt 87.6 kg   SpO2 100%   BMI 28.52 kg/m  SpO2: SpO2: 100 % O2 Device: O2 Device: Nasal Cannula O2 Flow Rate: O2 Flow Rate (L/min): 3 L/min       Palliative Assessment/Data: 30%     Palliative Care Plan    Recommendations/Plan:  Will decreased clonopin from 1 mg bid to 0.25 mg bid.  He will still have lorazepam PRN panic (if he tries to pull out his N/G).  Add PRN pyridium for burning on urination - will turn urine bright orange.  Talked with wife, Adam Fowler.  Discussed CT results.  Nothing dramatically different.  Still with bilateral pleural effusions, pancreatic fluid collections maturing  Am concerned patient is stagnating/FTT rather than improving.  Code Status:  Full code  Prognosis:   Unable to determine   Discharge Planning:  To Be Determined  Care plan was discussed with ICU RN, Wife.  Thank you for allowing the Palliative Medicine Team to assist in the care of this patient.  Total time spent:  25 min.     Greater than 50%  of this time was spent counseling and coordinating care related to the above assessment and plan.  Florentina Jenny, PA-C Palliative Medicine  Please contact Palliative MedicineTeam phone at [phone] for questions and concerns between 7 am - 7 pm.   Please see AMION for individual provider pager numbers.

## 2020-06-08 NOTE — Progress Notes (Signed)
    [facility]   Reason for visit: Follow up on bacteremia  Interval History: CT with continued necrotizing pancreatitis.  Biliary drain replaced  Physical Exam: Constitutional:  Vitals:   06/08/20 0900 06/08/20 1000  BP: 140/76 115/84  Pulse: (!) 105 99  Resp: 14 18  Temp:    SpO2: 94% 100%   patient appears in NAD  Impression: bacteremia  Plan: 1.  Continue with Unasyn.    Dr. Baxter Flattery available over the weekend if needed, otherwise I will follow up on Monday

## 2020-06-08 NOTE — Progress Notes (Signed)
[facility] Progress Note    Since last GI note: CT last night essentially unchanged however radiologist felt the peripancreatic fluid collections have contracted, matured a bit. IR percutaneous drains were exchanged: GB fossa exchaged for same size 12Fr drain, PBD exchanged/upsized to a 12Fr drain (from 10Fr)   Objective: Vital signs in last 24 hours: Temp:  [98 F (36.7 C)-99.2 F (37.3 C)] 98 F (36.7 C) (10/01 0800) Pulse Rate:  [99-115] 102 (10/01 1200) Resp:  [13-35] 20 (10/01 1200) BP: (112-168)/(73-102) 112/76 (10/01 1200) SpO2:  [92 %-100 %] 100 % (10/01 1200) Weight:  [87.6 kg] 87.6 kg (10/01 0459) Last BM Date: 06/06/20 General: drowsy but arousable Heart: regular rate and rythm Abdomen: distended, BS minimal  Lab Results: Recent Labs    06/06/20 0455 06/07/20 0423  WBC 17.7* 21.4*  HGB 8.0* 8.5*  PLT 258 286  MCV 89.9 91.3   Recent Labs    06/06/20 0455 06/07/20 0423  NA 140 142  K 3.9 4.4  CL 111 112*  CO2 21* 21*  GLUCOSE 150* 183*  BUN 29* 33*  CREATININE 0.69 0.72  CALCIUM 8.6* 8.7*   Recent Labs    06/06/20 0455 06/07/20 0423  PROT 5.8* 6.1*  ALBUMIN 2.1* 2.0*  AST 22 32  ALT 23 25  ALKPHOS 187* 268*  BILITOT 0.9 1.0   Studies/Results: DG Abd 1 View  Result Date: 06/06/2020 CLINICAL DATA:  Check feeding catheter placement EXAM: ABDOMEN - 1 VIEW COMPARISON:  Film from earlier in the same day. FINDINGS: Gastric catheter has been removed. New weighted feeding catheter has been placed and extends into the fourth portion of the duodenum near the proximal jejunum. Previous cholecystostomy tube and biliary drain are again seen and stable. No obstructive changes are seen. IMPRESSION: Feeding tube in satisfactory position in the distal duodenum. Electronically Signed   By: Inez Catalina M.D.   On: 06/06/2020 12:39   CT ABDOMEN PELVIS W CONTRAST  Result Date: 06/08/2020 CLINICAL DATA:  Acute, severe pancreatitis, evaluate fluid  collections EXAM: CT ABDOMEN AND PELVIS WITH CONTRAST TECHNIQUE: Multidetector CT imaging of the abdomen and pelvis was performed using the standard protocol following bolus administration of intravenous contrast. CONTRAST:  155mL OMNIPAQUE IOHEXOL 300 MG/ML  SOLN COMPARISON:  05/31/2020 FINDINGS: Lower chest: Moderate bilateral pleural effusion, progressed on the right. Left more than right lower lobe collapse. Hepatobiliary: Right-sided percutaneous biliary drain traversing a CBD stent, tip at the distal duodenum. Cholecystostomy tube with collapsed gallbladder. Stable appearanceof the liver. Pancreas: Sequela of pancreatitis with organized fluid collection extending from the gastrosplenic ligament inferiorly and encircling the pancreas. There has been a diffuse decrease in size of the collection, although the extent is unchanged. For example, in the midline at the level of the gastric body the collection measures up to 3 cm in thickness as compared to 4.7 cm previously. Degree of fat stranding in the upper abdomen is unchanged. Vague high-density areas at the level of the pancreatic head presumably areas of previously administered contrast and areas of non necrotic parenchyma. Spleen: Unremarkable. Adrenals/Urinary Tract: Negative adrenals. No hydronephrosis or stone. Unremarkable bladder. Stomach/Bowel: No obstruction. There is percutaneous gastrostomy tube and post pyloric feeding tube which are in unremarkable position. The stomach is less distended today with diffuse wall thickening from submucosal edema. Administered enteric contrast reaches the distal colon. Vascular/Lymphatic: IVC narrowing due to the retroperitoneal inflammation, unchanged. Narrowing of the main portal vein due to the inflammation, but patent and unchanged. Enlarged small bowel  mesenteric lymph nodes and retroperitoneal lymph nodes, considered reactive. Reproductive:No pathologic findings. Other: Moderate ascites without focal loculation.   Body wall edema. Musculoskeletal: No acute abnormalities. IMPRESSION: 1. Sequela of necrotic pancreatitis with extensive fluid collection. The collections are unchanged in extent and have contracted with matured organization. 2. Moderate ascites. 3. Moderate bilateral pleural effusion, progressed on the right. 4. Bilateral lower lobe atelectasis. Electronically Signed   By: Monte Fantasia M.D.   On: 06/08/2020 05:03   DG Abd Portable 1V  Result Date: 06/07/2020 CLINICAL DATA:  Check gastric catheter placement EXAM: PORTABLE ABDOMEN - 1 VIEW COMPARISON:  06/06/2020 FINDINGS: Gastric catheter is now seen within the stomach. Feeding catheter extending into the fourth portion of the duodenum is noted. Cholecystostomy tube and biliary drain are again noted and stable. No other focal abnormality is seen. IMPRESSION: New gastric catheter within the stomach as described. Electronically Signed   By: Inez Catalina M.D.   On: 06/07/2020 16:30   IR EXCHANGE BILIARY DRAIN  Result Date: 06/07/2020 INDICATION: 64 year old male with a prior subtotal cholecystectomy 01/24/2020 with biliary leak and prior common bile duct stenting. The patient is now admitted with pancreatitis The patient has required percutaneous drainage of gallbladder fossa fluid, as well as internal external biliary drainage given the occluded duct stent. EXAM: Through the tube cholangiogram Routine exchange of internal external biliary drain Routine exchange of percutaneous right upper quadrant fluid collection drain MEDICATIONS: NONE ANESTHESIA/SEDATION: Moderate (conscious) sedation was employed during this procedure. A total of Versed 2.0 mg and Fentanyl 100 mcg was administered intravenously. Moderate Sedation Time: 17 minutes. The patient's level of consciousness and vital signs were monitored continuously by radiology nursing throughout the procedure under my direct supervision. FLUOROSCOPY TIME:  Fluoroscopy Time: 2 minutes 54 seconds (75 mGy).  COMPLICATIONS: NONE PROCEDURE: Informed written consent was obtained from the patient after a thorough discussion of the procedural risks, benefits and alternatives. All questions were addressed. Maximal Sterile Barrier Technique was utilized including caps, mask, sterile gowns, sterile gloves, sterile drape, hand hygiene and skin antiseptic. A timeout was performed prior to the initiation of the procedure. Patient positioned supine position. The right upper quadrant including the drain system were prepped and draped in the usual sterile fashion 1% lidocaine was used for local anesthesia. Contrast was first injected through the internal external biliary drain confirming occlusion. A combination of a Bentson wire and a 40 cm Kumpe the catheter were used to navigate the wire cross the occluded duct stent into the duodenum. Wire was removed and contrast injected confirmed location within the duodenum. We then injected the fossa drain, which confirmed a patent cystic duct communicating with the extrahepatic common bile duct. Contrast appeared to accumulate adjacent to the covered stent within the common hepatic duct. The fossa drain was then routinely exchange with modified Seldinger technique for any 12 [place] drain. A new 12 French internal/external biliary drain was advanced into the duodenum on the wire. The drain was appropriately positioned and a final image was stored. Patient remained hemodynamically stable throughout. No complications were encountered and no significant blood loss. FINDINGS: Injection of contrast through the gallbladder fossa drain confirms a patent cystic duct to the biliary system with contrast accumulating adjacent to the proximal covered stent in the common hepatic duct. No evidence of a fistula to the duodenum or the small bowel. Contrast accumulating adjacent to the gallbladder fossa is believed to be retroperitoneal, within the fluid collection associated with the necrotizing  pancreatitis. IMPRESSION: Status post  through the tube cholangiogram, with routine exchange of gallbladder fossa percutaneous drain and internal/external biliary drain, with 12 French drains placed to gravity. Signed, Dulcy Fanny. Dellia Nims, RPVI [facility] Radiology Electronically Signed   By: Corrie Mckusick D.O.   On: 06/07/2020 14:59   IR EXCHANGE BILIARY DRAIN  Result Date: 06/07/2020 INDICATION: 64 year old male with a prior subtotal cholecystectomy 01/24/2020 with biliary leak and prior common bile duct stenting. The patient is now admitted with pancreatitis The patient has required percutaneous drainage of gallbladder fossa fluid, as well as internal external biliary drainage given the occluded duct stent. EXAM: Through the tube cholangiogram Routine exchange of internal external biliary drain Routine exchange of percutaneous right upper quadrant fluid collection drain MEDICATIONS: NONE ANESTHESIA/SEDATION: Moderate (conscious) sedation was employed during this procedure. A total of Versed 2.0 mg and Fentanyl 100 mcg was administered intravenously. Moderate Sedation Time: 17 minutes. The patient's level of consciousness and vital signs were monitored continuously by radiology nursing throughout the procedure under my direct supervision. FLUOROSCOPY TIME:  Fluoroscopy Time: 2 minutes 54 seconds (75 mGy). COMPLICATIONS: NONE PROCEDURE: Informed written consent was obtained from the patient after a thorough discussion of the procedural risks, benefits and alternatives. All questions were addressed. Maximal Sterile Barrier Technique was utilized including caps, mask, sterile gowns, sterile gloves, sterile drape, hand hygiene and skin antiseptic. A timeout was performed prior to the initiation of the procedure. Patient positioned supine position. The right upper quadrant including the drain system were prepped and draped in the usual sterile fashion 1% lidocaine  was used for local anesthesia. Contrast was first injected through the internal external biliary drain confirming occlusion. A combination of a Bentson wire and a 40 cm Kumpe the catheter were used to navigate the wire cross the occluded duct stent into the duodenum. Wire was removed and contrast injected confirmed location within the duodenum. We then injected the fossa drain, which confirmed a patent cystic duct communicating with the extrahepatic common bile duct. Contrast appeared to accumulate adjacent to the covered stent within the common hepatic duct. The fossa drain was then routinely exchange with modified Seldinger technique for any 12 [place] drain. A new 12 French internal/external biliary drain was advanced into the duodenum on the wire. The drain was appropriately positioned and a final image was stored. Patient remained hemodynamically stable throughout. No complications were encountered and no significant blood loss. FINDINGS: Injection of contrast through the gallbladder fossa drain confirms a patent cystic duct to the biliary system with contrast accumulating adjacent to the proximal covered stent in the common hepatic duct. No evidence of a fistula to the duodenum or the small bowel. Contrast accumulating adjacent to the gallbladder fossa is believed to be retroperitoneal, within the fluid collection associated with the necrotizing pancreatitis. IMPRESSION: Status post through the tube cholangiogram, with routine exchange of gallbladder fossa percutaneous drain and internal/external biliary drain, with 12 French drains placed to gravity. Signed, Dulcy Fanny. Dellia Nims, RPVI [facility] Radiology Electronically Signed   By: Corrie Mckusick D.O.   On: 06/07/2020 14:59     Medications: Scheduled Meds: . Chlorhexidine Gluconate Cloth  6 each Topical Daily  . clonazePAM  1 mg Per Tube BID  . enoxaparin (LOVENOX) injection  40 mg Subcutaneous Q24H  .  fentaNYL  2 patch Transdermal Q72H  . [START ON 06/09/2020] fentaNYL  1 patch Transdermal Q72H  . influenza vac split quadrivalent PF  0.5 mL Intramuscular Tomorrow-1000  .  insulin aspart  0-20 Units Subcutaneous Q4H  . lidocaine  1 patch Transdermal Q24H  . mouth rinse  15 mL Mouth Rinse BID  . pantoprazole (PROTONIX) IV  40 mg Intravenous Q12H  . pravastatin  20 mg Per Tube q1800  . sertraline  100 mg Per Tube Daily  . sodium chloride flush  10-40 mL Intracatheter Q12H  . sodium chloride flush  5 mL Intracatheter Q8H  . sodium chloride flush  5 mL Intracatheter Q8H  . tamsulosin  0.4 mg Oral Daily   Continuous Infusions: . sodium chloride Stopped (06/03/20 1628)  . ampicillin-sulbactam (UNASYN) IV Stopped (06/08/20 1696)  . feeding supplement (VITAL 1.5 CAL) 1,000 mL (06/07/20 1620)  . TPN ADULT (ION) 100 mL/hr at 06/08/20 1000  . TPN ADULT (ION)     PRN Meds:.bisacodyl, fentaNYL (SUBLIMAZE) injection, ipratropium-albuterol, lidocaine, LORazepam, polyvinyl alcohol, promethazine, sodium chloride flush, sodium chloride flush   Assessment/Plan: 64 y.o. male with complicated and severe pancreaticobiliary disease  Severe acute pancreatitis following ERCP, persistent biliary leak following gangrenous partial GB resection surgery 01/2020.  He has a (possibly) buried fully covered metal biliary stent. He has a PBD that has been in place for weeks, upsized yeterday to a 12 Fr drain.  He has a 10Fr coiled drain in GB remnant, fossa.  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  He is getting nutrition via TPN and has NG tube in place which he routinely pulls out.  Continued ongoing discussion between GI, IR, surgery about his care, leg by Dr. Rush Landmark from a GI perspective and Dr. Zenia Resides from surgical perspective.    [facility] will return to see him on Monday, I am around all weekend if any questions or concerns  prior to then.  Milus Banister, MD  06/08/2020, 12:12 PM [facility] Pager [phone]

## 2020-06-08 NOTE — Progress Notes (Signed)
NAME:  Adam Fowler, MRN:  [id number], DOB:  Dec 20, 1955, LOS: 15 ADMISSION DATE:  04/26/2020, CONSULTATION DATE:  9/23 REFERRING MD:  Wyline Copas, CHIEF COMPLAINT:  Hypotension   Brief History   64 yo M s/p subtotal cholecystectomy c/b persistent bile leak and biliary fistula, post ERCP necrotizing pancreatitis-- developed hypotension 9/23 and CCM consulted   Past Medical History  Kidney stones HTN Insomnia S/p cholecystectomy and abscess  Pancreatitis   Significant Hospital Events   8/20 admit 8/21-24 admitted 9/21 perc bilary drainage  9/23 drain capped. Acute decompensation in afternoon. Fluid resuscitation, starting on pressors and transferring to ICU  9/27 started fentanyl infusion, improvement in pain control; concern for pill fragments in GB fossa drain 9/28 placed blue food coloring in NG tube, not seen in GB fossa drain 9/29 started trophic tube feedings 9/30 injection of GB drains  Consults:  GI CCS IR Palliative care  Procedures:  9/3 GB fossa drain 9/21 cholangiogram and biliary drain placement  Significant Diagnostic Tests:   9/18 ct a/p w/con> moderate peripancreatic fluid extending into gastrohepatic ligament. Possible pseudocyst. Pigtail drainage catheter tip in region of gallbladder fossa. CBD stent. Persistent bibasilar opacities. L pleural effusion. Moderate ascites. Colonic diverticulosis 9/23 ct a/p w con>>>again noted are findings of acute pancreatitis with relatively  stable peripancreatic fluid collections.  Findings consistent with a gastric ileus. The esophagus is dilated and fluid-filled to at least the mid thorax. This patient would likely benefit from NG tube decompression. Worsening airspace opacities at the lung bases concerning for developing infiltrates or aspiration. Moderate-sized left-sided pleural effusion with at least partial collapse of the left lower lobe. There is a trace to small right-sided pleural effusion with mild adjacent atelectasis.   Stable small volume abdominal ascites.  Well-positioned internal/external biliary drain. Unchanged cholecystostomy tube.  10/01 CT abd/pelvis >> sequela of necrotic pancreatitis with extensive fluid collection, moderate ascites, mod b/l effusions, basilar ATX  Micro Data:  8/19 SARS COV 2 > neg 8/19 blood > negative 9/10 body fluid culture > negative 9/23 blood > enterococcus faecalis, klebsiella 9/25 blood > negative  Antimicrobials:  9/21 zosyn x1 9/24 mero > 9/26 9/24 zosyn x1 9/24 vanc > 9/26 9/27 unasyn >   Interim history/subjective:  Denies chest pain.  Objective   Blood pressure 140/76, pulse (!) 105, temperature 98 F (36.7 C), temperature source Axillary, resp. rate 14, height 5\' 9"  (1.753 m), weight 87.6 kg, SpO2 94 %.        Intake/Output Summary (Last 24 hours) at 06/08/2020 0920 Last data filed at 06/08/2020 0900 Gross per 24 hour  Intake 3106.25 ml  Output 1995 ml  Net 1111.25 ml   Filed Weights   06/04/20 0500 06/07/20 0427 06/08/20 0459  Weight: 88.9 kg 88.9 kg 87.6 kg    Examination:  General - sleepy Eyes - pupils reactive ENT - NG tube in place Cardiac - regular, tachycardic Chest - decreased BS at bases with faint crackles Abdomen - mild distention, decreased bowel sounds, drains in place Extremities - 2+ edema Skin - no rashes Neuro - follows simple commands, moves extremities  Resolved Hospital Problem list   Septic shock from peritonitis and bacteremia, Acute hypoxic respiratory failure, Acute metabolic encephalopathy 2nd to sepsis  Assessment & Plan:   Persistent bile leak after cholecystectomy on 01/24/20 with 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 Severe protein calorie malnutrition. - biliary drains per IR, CCS, and GI - nutrition per CCS  and GI >> remains on TPN with trickle tube feeds  Peritonitis with Enterococcal and Klebsiella  bacteremia. - ABx per ID - will need TEE at some point when more stable  Reflux esophagitis. - protonix BID  Depression, anxiety, chronic pain, insomnia. - continue klonopin, duragesic, lidoderm patch, zoloft - palliative care consulted  Hypercholesterolemia - continue pravachol  Hyperglycemia. - SSI  Urine retention. - flomax  Best practice:  Diet: TPN, trickle tube feeds DVT prophylaxis: lovenox GI prophylaxis: BID protonix Mobility: out of bed as able Code Status: full Family Communication: updated pt's wife at bedside  Labs    CMP Latest Ref Rng & Units 06/07/2020 06/06/2020 06/05/2020  Glucose 70 - 99 mg/dL 183(H) 150(H) 179(H)  BUN 8 - 23 mg/dL 33(H) 29(H) 34(H)  Creatinine 0.61 - 1.24 mg/dL 0.72 0.69 0.73  Sodium 135 - 145 mmol/L 142 140 140  Potassium 3.5 - 5.1 mmol/L 4.4 3.9 3.7  Chloride 98 - 111 mmol/L 112(H) 111 112(H)  CO2 22 - 32 mmol/L 21(L) 21(L) 20(L)  Calcium 8.9 - 10.3 mg/dL 8.7(L) 8.6(L) 8.4(L)  Total Protein 6.5 - 8.1 g/dL 6.1(L) 5.8(L) 5.6(L)  Total Bilirubin 0.3 - 1.2 mg/dL 1.0 0.9 0.9  Alkaline Phos 38 - 126 U/L 268(H) 187(H) 210(H)  AST 15 - 41 U/L 32 22 27  ALT 0 - 44 U/L 25 23 27     CBC Latest Ref Rng & Units 06/07/2020 06/06/2020 06/05/2020  WBC 4.0 - 10.5 K/uL 21.4(H) 17.7(H) 18.2(H)  Hemoglobin 13.0 - 17.0 g/dL 8.5(L) 8.0(L) 8.5(L)  Hematocrit 39 - 52 % 29.4(L) 27.6(L) 28.5(L)  Platelets 150 - 400 K/uL 286 258 267    ABG    Component Value Date/Time   PHART 7.295 (L) 05/31/2020 1625   PCO2ART 38.6 05/31/2020 1625   PO2ART 62.0 (L) 05/31/2020 1625   HCO3 18.0 (L) 05/31/2020 1625   TCO2 26 04/30/2020 0241   ACIDBASEDEF 7.1 (H) 05/31/2020 1625   O2SAT 87.5 05/31/2020 1625    CBG (last 3)  Recent Labs    06/07/20 2322 06/08/20 0359 06/08/20 0742  GLUCAP 164* 170* 160*    Signature:  Chesley Mires, MD [facility] Pager - [phone] 06/08/2020, 9:34 AM

## 2020-06-08 NOTE — Progress Notes (Signed)
Subjective: Drains upsized to 12Fr yesterday. Gallbladder drain is serous this morning. Patient is tolerating trophic feeds, continues to have bowel movements. CT scan this morning shows persistent peripancreatic fluid, appears slightly smaller in size than last week. No gas within the fluid collections.  Objective: Vital signs in last 24 hours: Temp:  [97.8 F (36.6 C)-99.2 F (37.3 C)] 97.8 F (36.6 C) (10/01 1200) Pulse Rate:  [99-115] 102 (10/01 1200) Resp:  [13-35] 20 (10/01 1200) BP: (112-168)/(73-102) 112/76 (10/01 1200) SpO2:  [92 %-100 %] 100 % (10/01 1200) Weight:  [87.6 kg] 87.6 kg (10/01 0459) Last BM Date: 06/06/20  Intake/Output from previous day: 09/30 0701 - 10/01 0700 In: 3035.5 [I.V.:2287.2; NG/GT:333.3; IV Piggyback:399.9] Out: 1995 [Urine:1000; Emesis/NG output:500; Drains:495] Intake/Output this shift: Total I/O In: 491.6 [I.V.:397.7; IV Piggyback:93.9] Out: 225 [Drains:225]  PE: General: NAD HEENT: [place] feeding tube in place with feeds running at 20 ml/hr. Resp: normal work of breathing, on nasal cannula  Abdomen: distended but soft and minimally tender, RUQ PBD with bilious fluid in bag. Percutaneous gallbladder drain with serous fluid. Extremities: bilateral lower extremity edema  Lab Results:  Recent Labs    06/06/20 0455 06/07/20 0423  WBC 17.7* 21.4*  HGB 8.0* 8.5*  HCT 27.6* 29.4*  PLT 258 286   BMET Recent Labs    06/06/20 0455 06/07/20 0423  NA 140 142  K 3.9 4.4  CL 111 112*  CO2 21* 21*  GLUCOSE 150* 183*  BUN 29* 33*  CREATININE 0.69 0.72  CALCIUM 8.6* 8.7*   PT/INR No results for input(s): LABPROT, INR in the last 72 hours. CMP     Component Value Date/Time   NA 142 06/07/2020 0423   K 4.4 06/07/2020 0423   CL 112 (H) 06/07/2020 0423   CO2 21 (L) 06/07/2020 0423   GLUCOSE 183 (H) 06/07/2020 0423   BUN 33 (H) 06/07/2020 0423   CREATININE 0.72 06/07/2020 0423   CALCIUM 8.7 (L) 06/07/2020 0423   PROT 6.1 (L)  06/07/2020 0423   ALBUMIN 2.0 (L) 06/07/2020 0423   AST 32 06/07/2020 0423   ALT 25 06/07/2020 0423   ALKPHOS 268 (H) 06/07/2020 0423   BILITOT 1.0 06/07/2020 0423   GFRNONAA >60 06/07/2020 0423   GFRAA >60 06/07/2020 0423   Lipase     Component Value Date/Time   LIPASE 27 05/16/2020 1905       Studies/Results: CT ABDOMEN PELVIS W CONTRAST  Result Date: 06/08/2020 CLINICAL DATA:  Acute, severe pancreatitis, evaluate fluid collections EXAM: CT ABDOMEN AND PELVIS WITH CONTRAST TECHNIQUE: Multidetector CT imaging of the abdomen and pelvis was performed using the standard protocol following bolus administration of intravenous contrast. CONTRAST:  13mL OMNIPAQUE IOHEXOL 300 MG/ML  SOLN COMPARISON:  05/31/2020 FINDINGS: Lower chest: Moderate bilateral pleural effusion, progressed on the right. Left more than right lower lobe collapse. Hepatobiliary: Right-sided percutaneous biliary drain traversing a CBD stent, tip at the distal duodenum. Cholecystostomy tube with collapsed gallbladder. Stable appearanceof the liver. Pancreas: Sequela of pancreatitis with organized fluid collection extending from the gastrosplenic ligament inferiorly and encircling the pancreas. There has been a diffuse decrease in size of the collection, although the extent is unchanged. For example, in the midline at the level of the gastric body the collection measures up to 3 cm in thickness as compared to 4.7 cm previously. Degree of fat stranding in the upper abdomen is unchanged. Vague high-density areas at the level of the pancreatic head presumably areas of previously  administered contrast and areas of non necrotic parenchyma. Spleen: Unremarkable. Adrenals/Urinary Tract: Negative adrenals. No hydronephrosis or stone. Unremarkable bladder. Stomach/Bowel: No obstruction. There is percutaneous gastrostomy tube and post pyloric feeding tube which are in unremarkable position. The stomach is less distended today with diffuse  wall thickening from submucosal edema. Administered enteric contrast reaches the distal colon. Vascular/Lymphatic: IVC narrowing due to the retroperitoneal inflammation, unchanged. Narrowing of the main portal vein due to the inflammation, but patent and unchanged. Enlarged small bowel mesenteric lymph nodes and retroperitoneal lymph nodes, considered reactive. Reproductive:No pathologic findings. Other: Moderate ascites without focal loculation.  Body wall edema. Musculoskeletal: No acute abnormalities. IMPRESSION: 1. Sequela of necrotic pancreatitis with extensive fluid collection. The collections are unchanged in extent and have contracted with matured organization. 2. Moderate ascites. 3. Moderate bilateral pleural effusion, progressed on the right. 4. Bilateral lower lobe atelectasis. Electronically Signed   By: Monte Fantasia M.D.   On: 06/08/2020 05:03   DG Abd Portable 1V  Result Date: 06/07/2020 CLINICAL DATA:  Check gastric catheter placement EXAM: PORTABLE ABDOMEN - 1 VIEW COMPARISON:  06/06/2020 FINDINGS: Gastric catheter is now seen within the stomach. Feeding catheter extending into the fourth portion of the duodenum is noted. Cholecystostomy tube and biliary drain are again noted and stable. No other focal abnormality is seen. IMPRESSION: New gastric catheter within the stomach as described. Electronically Signed   By: Inez Catalina M.D.   On: 06/07/2020 16:30   IR EXCHANGE BILIARY DRAIN  Result Date: 06/07/2020 INDICATION: 64 year old male with a prior subtotal cholecystectomy 01/24/2020 with biliary leak and prior common bile duct stenting. The patient is now admitted with pancreatitis The patient has required percutaneous drainage of gallbladder fossa fluid, as well as internal external biliary drainage given the occluded duct stent. EXAM: Through the tube cholangiogram Routine exchange of internal external biliary drain Routine exchange of percutaneous right upper quadrant fluid collection  drain MEDICATIONS: NONE ANESTHESIA/SEDATION: Moderate (conscious) sedation was employed during this procedure. A total of Versed 2.0 mg and Fentanyl 100 mcg was administered intravenously. Moderate Sedation Time: 17 minutes. The patient's level of consciousness and vital signs were monitored continuously by radiology nursing throughout the procedure under my direct supervision. FLUOROSCOPY TIME:  Fluoroscopy Time: 2 minutes 54 seconds (75 mGy). COMPLICATIONS: NONE PROCEDURE: Informed written consent was obtained from the patient after a thorough discussion of the procedural risks, benefits and alternatives. All questions were addressed. Maximal Sterile Barrier Technique was utilized including caps, mask, sterile gowns, sterile gloves, sterile drape, hand hygiene and skin antiseptic. A timeout was performed prior to the initiation of the procedure. Patient positioned supine position. The right upper quadrant including the drain system were prepped and draped in the usual sterile fashion 1% lidocaine was used for local anesthesia. Contrast was first injected through the internal external biliary drain confirming occlusion. A combination of a Bentson wire and a 40 cm Kumpe the catheter were used to navigate the wire cross the occluded duct stent into the duodenum. Wire was removed and contrast injected confirmed location within the duodenum. We then injected the fossa drain, which confirmed a patent cystic duct communicating with the extrahepatic common bile duct. Contrast appeared to accumulate adjacent to the covered stent within the common hepatic duct. The fossa drain was then routinely exchange with modified Seldinger technique for any 12 [place] drain. A new 12 French internal/external biliary drain was advanced into the duodenum on the wire. The drain was appropriately positioned and a final image was  stored. Patient remained hemodynamically stable throughout. No complications were encountered and no significant  blood loss. FINDINGS: Injection of contrast through the gallbladder fossa drain confirms a patent cystic duct to the biliary system with contrast accumulating adjacent to the proximal covered stent in the common hepatic duct. No evidence of a fistula to the duodenum or the small bowel. Contrast accumulating adjacent to the gallbladder fossa is believed to be retroperitoneal, within the fluid collection associated with the necrotizing pancreatitis. IMPRESSION: Status post through the tube cholangiogram, with routine exchange of gallbladder fossa percutaneous drain and internal/external biliary drain, with 12 French drains placed to gravity. Signed, Dulcy Fanny. Dellia Nims, RPVI [facility] Rad[facility] Radiology Electronically Signed   By: Corrie Mckusick D.O.   On: 06/07/2020 14:59   IR EXCHANGE BILIARY DRAIN  Result Date: 06/07/2020 INDICATION: 63 year old male with a prior subtotal cholecystectomy 01/24/2020 with biliary leak and prior common bile duct stenting. The patient is now admitted with pancreatitis The patient has required percutaneous drainage of gallbladder fossa fluid, as well as internal external biliary drainage given the occluded duct stent. EXAM: Through the tube cholangiogram Routine exchange of internal external biliary drain Routine exchange of percutaneous right upper quadrant fluid collection drain MEDICATIONS: NONE ANESTHESIA/SEDATION: Moderate (conscious) sedation was employed during this procedure. A total of Versed 2.0 mg and Fentanyl 100 mcg was administered intravenously. Moderate Sedation Time: 17 minutes. The patient's level of consciousness and vital signs were monitored continuously by radiology nursing throughout the procedure under my direct supervision. FLUOROSCOPY TIME:  Fluoroscopy Time: 2 minutes 54 seconds (75 mGy). COMPLICATIONS: NONE PROCEDURE: Informed written consent was obtained from the patient after a thorough discussion of the  procedural risks, benefits and alternatives. All questions were addressed. Maximal Sterile Barrier Technique was utilized including caps, mask, sterile gowns, sterile gloves, sterile drape, hand hygiene and skin antiseptic. A timeout was performed prior to the initiation of the procedure. Patient positioned supine position. The right upper quadrant including the drain system were prepped and draped in the usual sterile fashion 1% lidocaine was used for local anesthesia. Contrast was first injected through the internal external biliary drain confirming occlusion. A combination of a Bentson wire and a 40 cm Kumpe the catheter were used to navigate the wire cross the occluded duct stent into the duodenum. Wire was removed and contrast injected confirmed location within the duodenum. We then injected the fossa drain, which confirmed a patent cystic duct communicating with the extrahepatic common bile duct. Contrast appeared to accumulate adjacent to the covered stent within the common hepatic duct. The fossa drain was then routinely exchange with modified Seldinger technique for any 12 [place] drain. A new 12 French internal/external biliary drain was advanced into the duodenum on the wire. The drain was appropriately positioned and a final image was stored. Patient remained hemodynamically stable throughout. No complications were encountered and no significant blood loss. FINDINGS: Injection of contrast through the gallbladder fossa drain confirms a patent cystic duct to the biliary system with contrast accumulating adjacent to the proximal covered stent in the common hepatic duct. No evidence of a fistula to the duodenum or the small bowel. Contrast accumulating adjacent to the gallbladder fossa is believed to be retroperitoneal, within the fluid collection associated with the necrotizing pancreatitis. IMPRESSION: Status post through the tube cholangiogram, with routine exchange of gallbladder fossa percutaneous drain  and internal/external biliary drain, with 12 French drains placed to gravity. Signed, Dulcy Fanny. Earleen Newport DO, RPVI [facility]ascular [facility] Interventional  Radiology Special[facility] Radiology Electronically Signed   By: Corrie Mckusick D.O.   On: 06/07/2020 14:59    Anti-infectives: Anti-infectives (From admission, onward)   Start     Dose/Rate Route Frequency Ordered Stop   06/04/20 1400  Ampicillin-Sulbactam (UNASYN) 3 g in sodium chloride 0.9 % 100 mL IVPB        3 g 200 mL/hr over 30 Minutes Intravenous Every 6 hours 06/04/20 0906     06/02/20 0300  meropenem (MERREM) 2 g in sodium chloride 0.9 % 100 mL IVPB  Status:  Discontinued        2 g 200 mL/hr over 30 Minutes Intravenous Every 8 hours 06/02/20 0248 06/04/20 0906   06/02/20 0300  vancomycin (VANCOCIN) IVPB 1000 mg/200 mL premix  Status:  Discontinued        1,000 mg 200 mL/hr over 60 Minutes Intravenous Every 12 hours 06/02/20 0248 06/04/20 0906   06/01/20 1000  piperacillin-tazobactam (ZOSYN) IVPB 3.375 g  Status:  Discontinued        3.375 g 12.5 mL/hr over 240 Minutes Intravenous Every 8 hours 06/01/20 0952 06/02/20 0248   05/29/20 0800  piperacillin-tazobactam (ZOSYN) IVPB 3.375 g        3.375 g 12.5 mL/hr over 240 Minutes Intravenous To Radiology 05/28/20 1328 05/29/20 1943   05/04/20 1230  meropenem (MERREM) 1 g in sodium chloride 0.9 % 100 mL IVPB  Status:  Discontinued        1 g 200 mL/hr over 30 Minutes Intravenous Every 8 hours 05/04/20 1156 05/19/20 0826   05/01/20 1400  piperacillin-tazobactam (ZOSYN) IVPB 3.375 g        3.375 g 12.5 mL/hr over 240 Minutes Intravenous Every 8 hours 05/01/20 1013 05/03/20 0907   04/29/20 2000  vancomycin (VANCOCIN) IVPB 1000 mg/200 mL premix  Status:  Discontinued        1,000 mg 200 mL/hr over 60 Minutes Intravenous Every 12 hours 04/29/20 0653 04/30/20 0831   04/29/20 0745  vancomycin (VANCOREADY) IVPB 1500 mg/300 mL        1,500 mg 150 mL/hr over 120 Minutes Intravenous  Once 04/29/20  0653 04/29/20 1129   04/26/20 2300  piperacillin-tazobactam (ZOSYN) IVPB 3.375 g        3.375 g 12.5 mL/hr over 240 Minutes Intravenous Every 8 hours 04/26/20 1716 05/01/20 0559   04/26/20 1700  piperacillin-tazobactam (ZOSYN) IVPB 3.375 g        3.375 g 100 mL/hr over 30 Minutes Intravenous  Once 04/26/20 1655 04/26/20 1844       Assessment/Plan 64 yo male s/p subtotal cholecystectomy, complicated by persistent bile leak, 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 S/p PTBD placement by IR. - Keep PTBD to gravity today, if patient remains stable may consider another capping trial. Keep gallbladder fossa drain to gravity drainage. - Trend LFTs daily - Remain NPO with TPN, advance feeds to 11ml/hr today. Do not advance by more than 11ml/hr per day, decrease rate if patient has worsening distension or pain. Keep stomach decompressed with NG tube to prevent aspiration. - Peripancreatic necrosis starting to mature on imaging, collections have contracted slightly. No signs of infected necrosis. Continue supportive care for now. I had a discussion with the patient's wife at bedside this morning and counseled that necrotizing pancreatitis often leads to prolonged illness and hospitalization. Patient needs continued supportive care, aggressive PT/OT, and optimization of nutrition. - Surgery will continue to follow closely   LOS: 42 days  Michaelle Birks, [facility], Hepatobiliary and Pancreatic Surgery 06/08/20  1:16 PM

## 2020-06-09 DIAGNOSIS — D649 Anemia, unspecified: Secondary | ICD-10-CM

## 2020-06-09 LAB — GLUCOSE, CAPILLARY
Glucose-Capillary: 156 mg/dL — ABNORMAL HIGH (ref 70–99)
Glucose-Capillary: 180 mg/dL — ABNORMAL HIGH (ref 70–99)
Glucose-Capillary: 158 mg/dL — ABNORMAL HIGH (ref 70–99)
Glucose-Capillary: 138 mg/dL — ABNORMAL HIGH (ref 70–99)
Glucose-Capillary: 141 mg/dL — ABNORMAL HIGH (ref 70–99)
Glucose-Capillary: 152 mg/dL — ABNORMAL HIGH (ref 70–99)

## 2020-06-09 LAB — CBC
HCT: 26.4 % — ABNORMAL LOW (ref 39.0–52.0)
Hemoglobin: 7.5 g/dL — ABNORMAL LOW (ref 13.0–17.0)
MCH: 26 pg (ref 26.0–34.0)
MCHC: 28.4 g/dL — ABNORMAL LOW (ref 30.0–36.0)
MCV: 91.3 fL (ref 80.0–100.0)
Platelets: 306 10*3/uL (ref 150–400)
RBC: 2.89 MIL/uL — ABNORMAL LOW (ref 4.22–5.81)
RDW: 15.8 % — ABNORMAL HIGH (ref 11.5–15.5)
WBC: 20.5 10*3/uL — ABNORMAL HIGH (ref 4.0–10.5)
nRBC: 0 % (ref 0.0–0.2)

## 2020-06-09 LAB — COMPREHENSIVE METABOLIC PANEL
ALT: 28 U/L (ref 0–44)
AST: 23 U/L (ref 15–41)
Albumin: 1.6 g/dL — ABNORMAL LOW (ref 3.5–5.0)
Alkaline Phosphatase: 232 U/L — ABNORMAL HIGH (ref 38–126)
Anion gap: 8 (ref 5–15)
BUN: 35 mg/dL — ABNORMAL HIGH (ref 8–23)
CO2: 23 mmol/L (ref 22–32)
Calcium: 8.7 mg/dL — ABNORMAL LOW (ref 8.9–10.3)
Chloride: 112 mmol/L — ABNORMAL HIGH (ref 98–111)
Creatinine, Ser: 0.66 mg/dL (ref 0.61–1.24)
GFR calc Af Amer: >60 mL/min (ref >60)
GFR calc non Af Amer: >60 mL/min (ref >60)
Glucose, Bld: 186 mg/dL — ABNORMAL HIGH (ref 70–99)
Potassium: 4.1 mmol/L (ref 3.5–5.1)
Sodium: 143 mmol/L (ref 135–145)
Total Bilirubin: 0.7 mg/dL (ref 0.3–1.2)
Total Protein: 6.3 g/dL — ABNORMAL LOW (ref 6.5–8.1)

## 2020-06-09 LAB — MAGNESIUM: Magnesium: 2.3 mg/dL (ref 1.7–2.4)

## 2020-06-09 LAB — IRON AND TIBC
Iron: 13 ug/dL — ABNORMAL LOW (ref 45–182)
Saturation Ratios: 6 % — ABNORMAL LOW (ref 17.9–39.5)
TIBC: 223 ug/dL — ABNORMAL LOW (ref 250–450)
UIBC: 210 ug/dL

## 2020-06-09 LAB — FERRITIN: Ferritin: 265 ng/mL (ref 24–336)

## 2020-06-09 MED ORDER — TRAVASOL 10 % IV SOLN
INTRAVENOUS | Status: DC
  Filled 2020-06-09: qty 720

## 2020-06-09 MED ORDER — TRAVASOL 10 % IV SOLN
INTRAVENOUS | Status: AC
  Filled 2020-06-09: qty 1440

## 2020-06-09 MED ORDER — PROSOURCE TF PO LIQD
45.0000 mL | Freq: Three times a day (TID) | ORAL | Status: DC
  Administered 2020-06-09 – 2020-06-12 (×10): 45 mL
  Filled 2020-06-09 (×10): qty 45

## 2020-06-09 NOTE — Progress Notes (Signed)
Occupational Therapy Note (late entry)  Pt seen in conjunction with PT.  Pt required mod A +2 for bed mobility and transfer to chair.  Pt demonstrates focused attention, but was able to follow 1 step commands with increased time.  Pt with obvious pain/discomfort with all aspects of mobility.  He requires max A for ADLs.  Goals updated.    06/08/20 1400  OT Visit Information  Last OT Received On 06/08/20  Assistance Needed +2  PT/OT/SLP Co-Evaluation/Treatment Yes  Reason for Co-Treatment Complexity of the patient's impairments (multi-system involvement);For patient/therapist safety;Necessary to address cognition/behavior during functional activity;To address functional/ADL transfers  OT goals addressed during session Strengthening/ROM  History of Present Illness Pt is a 64 yo male who presents to the ED with severe abdominal pain, nausea and vomiting. Found to have post ERCP acute pancreatits. Pt developed Hypoxic resp failure and was Intubated 8/22  and extubated 8/24. Pt also with new onset a fib and acute ileus. PMH includes HTN and ERCP.  Pt with multiple drain placements during admission and having difficulty with pain management.  Precautions  Precautions Fall;Other (comment)  Precaution Comments abdominal drain x 2, NG, cortrak  Pain Assessment  Pain Assessment Faces  Faces Pain Scale 10  Pain Location generalized  Pain Descriptors / Indicators Moaning  Pain Intervention(s) Monitored during session;Repositioned  Cognition  Arousal/Alertness Awake/alert  Behavior During Therapy Impulsive  Overall Cognitive Status Impaired/Different from baseline  Area of Impairment Attention;Memory;Following commands;Safety/judgement;Awareness;Problem solving  Current Attention Level Focused  Memory Decreased recall of precautions;Decreased short-term memory  Following Commands Follows one step commands with increased time  Safety/Judgement Decreased awareness of safety;Decreased awareness of  deficits  Awareness Emergent  Problem Solving Slow processing;Requires verbal cues;Requires tactile cues  Upper Extremity Assessment  Upper Extremity Assessment Generalized weakness  Lower Extremity Assessment  Lower Extremity Assessment Defer to PT evaluation  ADL  Toilet Transfer Moderate assistance;+2 for physical assistance;+2 for safety/equipment;BSC  Bed Mobility  Overal bed mobility Needs Assistance  Bed Mobility Supine to Sit  Supine to sit Min assist;+2 for physical assistance;HOB elevated  General bed mobility comments cues for initiation, assist for trunk elevation  Balance  Overall balance assessment Needs assistance  Sitting-balance support Bilateral upper extremity supported;Feet supported  Sitting balance-Leahy Scale Poor  Sitting balance - Comments minG-minA with BUE support of bed  Standing balance support Bilateral upper extremity supported  Standing balance-Leahy Scale Poor  Standing balance comment min-modA x2 with BUE hand hold  Transfers  Overall transfer level Needs assistance  Equipment used 2 person hand held assist  Transfers Sit to/from Stand;Stand Pivot Transfers  Sit to Stand Mod assist;+2 physical assistance  Stand pivot transfers Mod assist;+2 physical assistance  General transfer comment Requires verbal and tactile cues during transfer for sequencing  General Comments  General comments (skin integrity, edema, etc.) pt on 3L [place], tachy into 120s with mobility. Pt moaning throughout session in significant pain  OT - End of Session  Equipment Utilized During Treatment Oxygen  Activity Tolerance Patient limited by pain  Patient left in chair;with call bell/phone within reach;with chair alarm set;with restraints reapplied  Nurse Communication Mobility status  OT Assessment/Plan  OT Plan Discharge plan remains appropriate (but dependent on progress;  goals updated )  OT Visit Diagnosis Unsteadiness on feet (R26.81);Cognitive communication deficit  (R41.841);Pain  Pain - part of body  (abdomen )  OT Frequency (ACUTE ONLY) Min 2X/week  Follow Up Recommendations CIR  OT Equipment None recommended by OT  AM-PAC OT "  6 Clicks" Daily Activity Outcome Measure (Version 2)  Help from another person eating meals? 1  Help from another person taking care of personal grooming? 2  Help from another person toileting, which includes using toliet, bedpan, or urinal? 2  Help from another person bathing (including washing, rinsing, drying)? 2  Help from another person to put on and taking off regular upper body clothing? 1  Help from another person to put on and taking off regular lower body clothing? 1  6 Click Score 9  OT Goal Progression  Progress towards OT goals Not progressing toward goals - comment  Acute Rehab OT Goals  Patient Stated Goal to reduce pain  OT Goal Formulation Patient unable to participate in goal setting  Time For Goal Achievement 06/23/20  Potential to Achieve Goals Good  ADL Goals  Pt Will Perform Grooming with min assist;standing  Pt Will Perform Lower Body Bathing with mod assist;with adaptive equipment;sit to/from stand  Pt Will Perform Lower Body Dressing with mod assist;with adaptive equipment;sit to/from stand  Pt Will Transfer to Toilet with min assist;ambulating;regular height toilet;bedside commode;grab bars  Pt Will Perform Toileting - Clothing Manipulation and hygiene with min assist;sit to/from stand  Additional ADL Goal #1 Pt will sustain attention to familiar ADL tasks x 4 mins with min cues   Pt/caregiver will Perform Home Exercise Program Increased strength;With theraband;With written HEP provided;With minimal assist  OT Time Calculation  OT Start Time (ACUTE ONLY) 1235  OT Stop Time (ACUTE ONLY) 1304  OT Time Calculation (min) 29 min  OT General Charges  $OT Visit 1 Visit  OT Treatments  $Therapeutic Activity 8-22 mins  Nilsa Nutting., OTR/L Acute Rehabilitation Services Pager [phone] Office  [phone]

## 2020-06-09 NOTE — Progress Notes (Addendum)
PHARMACY - TOTAL PARENTERAL NUTRITION CONSULT NOTE  Indication:  Severe pancreatitis, biliary fistula, GOO  Patient Measurements: Height: 5' 9"  (175.3 cm) Weight: 87.6 kg (193 lb 2 oz) IBW/kg (Calculated) : 70.7 TPN AdjBW (KG): 86.1 Body mass index is 28.52 kg/m.  Assessment:  27 YOM admitted 04/26/20, post-ERCP 8/18 and biliary stent exchange for persistent postoperative bile leak related to recent cholecystectomy with abscess s/p percutaneous drainage. Patient developed acute pancreatitis, has had a prolonged hospitalization complicated by VDRF (extubated 8/24), AKI, encephalopathy, and afib. TPN initiated 9/11 for acute post-ERCP pancreatitis/ileus and persistent biliary leak. Significant gastric fluid retention from functional outlet obstruction. Patient acutely decompensated 9/23 resulted in pressors, non-rebreather and septic shock.   10/2: tolerating TF at 30 ml/hr - discussed reducing TPN rate to adjust for TF calories, also discussed adding in ProSource with RD    Glucose / Insulin: A1c 5.6% - CBGs <180. Required 22 units rSSI + 35 units in TPN.  Electrolytes: K 4.1 (goal >/= 4 for ileus), Cl 112 high-normal. CO2 23. (max acetate in TPN), CoCa up to 10.6 Renal: SCr < 1, BUN down 33 LFTs / TGs: LFTs WNL except alk phos down to 232, tbili WNL, TG mildly elevated 180 Prealbumin / albumin: Prealbumin 8.2 (stable). Albumin 2.0 Intake / Output; MIVF: UOP 0.6 mL/kg/hr (down), NG up to 111m, drain up to 8573m net -1.3L (since 9/18).  LBM 10/2.    GI Imaging:  9/5: CT - severe pancreatitis 9/7: HIDA - persistent bile leak with accumulations/paracolic gutter 9/9: CT - pancreatitis (peripancreatic fat stranding and peripancreatic fluid, percutaneous drain at the gallbladder fossa, unchanged biliary stent with no intrahepatic biliary duct dilatation, bilateral pleural effusions 9/15: KUB - diffuse colonic stool, no bowel obstruction/free air 9/18: CT - mild colonic diverticulosis,  pseudocyst involvement and less likely abscess 9/23: KUB - no evidence of bowel obstruction/ileus 9/23: CT - Gastric ileus, dilated/fluid-filled esophagus, small volume abd ascites 9/26: KUB - Suspect ascites and diffuse body wall edema 10/1: CT - sequela of necrotic pancreatitis with extensive fluid collection, moderate ascites, mod b/l effusions, basilar ATX Surgeries / Procedures: 8/18: ERCP/biliary stent exchange 9/9: Unsuccessful ERCP 9/10: Paracentesis - 1.4L out 9/21: EGD- Esophagitis w/ active oozing and stigmata of recent bleeding, gastritis w/ hemorrhage, congested duodenal mucosa; NGT trauma  9/21: Paracentesis/biliary drain placement/cholangiogram/drain check- 3L removed 9/30: GB fossa drain occluded, exchanged and upsized to 70F  Central access: PICC placed 05/19/20 TPN start date: 05/19/20  Nutritional Goals (per RD rec on 05/18/20): kCal: 2300-2500, Protein: 130-145g, Fluid: >2.3L/day  Current Nutrition: TPN - goal rate is 10039mr, providing 144g AA, 336g CHO and 67g ILE for a total of 2390 kCal 9/29- Trickle feeds initiated: Vital 1.5 at 24m75m (goal rate of 65mL70m incr 10 ml/day max) -provides 2340 kcal, 105g AA, 84g ILE, 292g CHO at goal.  ProSource 45 ml TID (11 g protein, 40 kcal per packet)  Plan:  Decrease TPN to half goal rate of 50mL/19mproviding 72g AA, 168g CHO and 33.5g ILE for a total of 1195 kCal, meeting 50% of needs  TF and ProSource meeting 50% of needs TPN and TF are meeting 100% of needs Electrolytes in TPN: Na 24mEq/79m 45mEq/L76mg 4mEq/L, 9ms 7mmol/L, 79m3mEq/L, ma103mcetate - no change today  Add standard MVI and trace elements to TPN  Continue resistant SSI Q4H + decrease to 15 units regular insulin in TPN  F/u tolerance to TF  Thank you for involving pharmacy in this patient's care.  Renold Genta, PharmD, BCPS Clinical Pharmacist Clinical phone for 06/09/2020 until 3p is [phone] 06/09/2020 7:09 AM  **Pharmacist phone directory can be  found on [email or web address] listed under [place]**  Addendum: After reviewing Surgery note, holding TF given reflux into NG tube.  Will order goal rate TPN today (100 ml/hr), remove Ca from TPN. F/U tomorrow  Renold Genta, PharmD, BCPS 11:30 AM

## 2020-06-09 NOTE — Progress Notes (Signed)
Subjective: No acute changes. NG output appears to contain tube feeds. Patient is having bowel movements.   Objective: Vital signs in last 24 hours: Temp:  [97.8 F (36.6 C)-100.1 F (37.8 C)] 98.5 F (36.9 C) (10/02 0800) Pulse Rate:  [96-108] 103 (10/02 0800) Resp:  [17-25] 24 (10/02 0800) BP: (112-130)/(71-88) 118/71 (10/02 0800) SpO2:  [94 %-100 %] 99 % (10/02 0800) Last BM Date: 06/09/20  Intake/Output from previous day: 10/01 0701 - 10/02 0700 In: 3080.1 [I.V.:2392.8; NG/GT:283.5; IV Piggyback:393.9] Out: 3250 [Urine:1250; Emesis/NG output:1150; Drains:850] Intake/Output this shift: Total I/O In: 699.2 [I.V.:258.2; NG/GT:441] Out: -   PE: General: NAD HEENT: NJ feeding tube in place with feeds running at 30 ml/hr. Resp: normal work of breathing, on nasal cannula  Abdomen: distended but soft and minimally tender, RUQ PBD with bilious fluid in bag. Percutaneous gallbladder drain with serous fluid. Extremities: bilateral lower extremity edema  Lab Results:  Recent Labs    06/07/20 0423 06/09/20 0614  WBC 21.4* 20.5*  HGB 8.5* 7.5*  HCT 29.4* 26.4*  PLT 286 306   BMET Recent Labs    06/07/20 0423 06/09/20 0614  NA 142 143  K 4.4 4.1  CL 112* 112*  CO2 21* 23  GLUCOSE 183* 186*  BUN 33* 35*  CREATININE 0.72 0.66  CALCIUM 8.7* 8.7*   PT/INR No results for input(s): LABPROT, INR in the last 72 hours. CMP     Component Value Date/Time   NA 143 06/09/2020 0614   K 4.1 06/09/2020 0614   CL 112 (H) 06/09/2020 0614   CO2 23 06/09/2020 0614   GLUCOSE 186 (H) 06/09/2020 0614   BUN 35 (H) 06/09/2020 0614   CREATININE 0.66 06/09/2020 0614   CALCIUM 8.7 (L) 06/09/2020 0614   PROT 6.3 (L) 06/09/2020 0614   ALBUMIN 1.6 (L) 06/09/2020 0614   AST 23 06/09/2020 0614   ALT 28 06/09/2020 0614   ALKPHOS 232 (H) 06/09/2020 0614   BILITOT 0.7 06/09/2020 0614   GFRNONAA >60 06/09/2020 0614   GFRAA >60 06/09/2020 0614   Lipase     Component Value  Date/Time   LIPASE 27 05/16/2020 1905       Studies/Results: CT ABDOMEN PELVIS W CONTRAST  Result Date: 06/08/2020 CLINICAL DATA:  Acute, severe pancreatitis, evaluate fluid collections EXAM: CT ABDOMEN AND PELVIS WITH CONTRAST TECHNIQUE: Multidetector CT imaging of the abdomen and pelvis was performed using the standard protocol following bolus administration of intravenous contrast. CONTRAST:  118mL OMNIPAQUE IOHEXOL 300 MG/ML  SOLN COMPARISON:  05/31/2020 FINDINGS: Lower chest: Moderate bilateral pleural effusion, progressed on the right. Left more than right lower lobe collapse. Hepatobiliary: Right-sided percutaneous biliary drain traversing a CBD stent, tip at the distal duodenum. Cholecystostomy tube with collapsed gallbladder. Stable appearanceof the liver. Pancreas: Sequela of pancreatitis with organized fluid collection extending from the gastrosplenic ligament inferiorly and encircling the pancreas. There has been a diffuse decrease in size of the collection, although the extent is unchanged. For example, in the midline at the level of the gastric body the collection measures up to 3 cm in thickness as compared to 4.7 cm previously. Degree of fat stranding in the upper abdomen is unchanged. Vague high-density areas at the level of the pancreatic head presumably areas of previously administered contrast and areas of non necrotic parenchyma. Spleen: Unremarkable. Adrenals/Urinary Tract: Negative adrenals. No hydronephrosis or stone. Unremarkable bladder. Stomach/Bowel: No obstruction. There is percutaneous gastrostomy tube and post pyloric feeding tube which are in unremarkable  position. The stomach is less distended today with diffuse wall thickening from submucosal edema. Administered enteric contrast reaches the distal colon. Vascular/Lymphatic: IVC narrowing due to the retroperitoneal inflammation, unchanged. Narrowing of the main portal vein due to the inflammation, but patent and unchanged.  Enlarged small bowel mesenteric lymph nodes and retroperitoneal lymph nodes, considered reactive. Reproductive:No pathologic findings. Other: Moderate ascites without focal loculation.  Body wall edema. Musculoskeletal: No acute abnormalities. IMPRESSION: 1. Sequela of necrotic pancreatitis with extensive fluid collection. The collections are unchanged in extent and have contracted with matured organization. 2. Moderate ascites. 3. Moderate bilateral pleural effusion, progressed on the right. 4. Bilateral lower lobe atelectasis. Electronically Signed   By: Monte Fantasia M.D.   On: 06/08/2020 05:03   DG Abd Portable 1V  Result Date: 06/07/2020 CLINICAL DATA:  Check gastric catheter placement EXAM: PORTABLE ABDOMEN - 1 VIEW COMPARISON:  06/06/2020 FINDINGS: Gastric catheter is now seen within the stomach. Feeding catheter extending into the fourth portion of the duodenum is noted. Cholecystostomy tube and biliary drain are again noted and stable. No other focal abnormality is seen. IMPRESSION: New gastric catheter within the stomach as described. Electronically Signed   By: Inez Catalina M.D.   On: 06/07/2020 16:30   IR EXCHANGE BILIARY DRAIN  Result Date: 06/07/2020 INDICATION: 64 year old male with a prior subtotal cholecystectomy 01/24/2020 with biliary leak and prior common bile duct stenting. The patient is now admitted with pancreatitis The patient has required percutaneous drainage of gallbladder fossa fluid, as well as internal external biliary drainage given the occluded duct stent. EXAM: Through the tube cholangiogram Routine exchange of internal external biliary drain Routine exchange of percutaneous right upper quadrant fluid collection drain MEDICATIONS: NONE ANESTHESIA/SEDATION: Moderate (conscious) sedation was employed during this procedure. A total of Versed 2.0 mg and Fentanyl 100 mcg was administered intravenously. Moderate Sedation Time: 17 minutes. The patient's level of consciousness and  vital signs were monitored continuously by radiology nursing throughout the procedure under my direct supervision. FLUOROSCOPY TIME:  Fluoroscopy Time: 2 minutes 54 seconds (75 mGy). COMPLICATIONS: NONE PROCEDURE: Informed written consent was obtained from the patient after a thorough discussion of the procedural risks, benefits and alternatives. All questions were addressed. Maximal Sterile Barrier Technique was utilized including caps, mask, sterile gowns, sterile gloves, sterile drape, hand hygiene and skin antiseptic. A timeout was performed prior to the initiation of the procedure. Patient positioned supine position. The right upper quadrant including the drain system were prepped and draped in the usual sterile fashion 1% lidocaine was used for local anesthesia. Contrast was first injected through the internal external biliary drain confirming occlusion. A combination of a Bentson wire and a 40 cm Kumpe the catheter were used to navigate the wire cross the occluded duct stent into the duodenum. Wire was removed and contrast injected confirmed location within the duodenum. We then injected the fossa drain, which confirmed a patent cystic duct communicating with the extrahepatic common bile duct. Contrast appeared to accumulate adjacent to the covered stent within the common hepatic duct. The fossa drain was then routinely exchange with modified Seldinger technique for any 12 [place] drain. A new 12 French internal/external biliary drain was advanced into the duodenum on the wire. The drain was appropriately positioned and a final image was stored. Patient remained hemodynamically stable throughout. No complications were encountered and no significant blood loss. FINDINGS: Injection of contrast through the gallbladder fossa drain confirms a patent cystic duct to the biliary system with contrast accumulating adjacent  to the proximal covered stent in the common hepatic duct. No evidence of a fistula to the duodenum  or the small bowel. Contrast accumulating adjacent to the gallbladder fossa is believed to be retroperitoneal, within the fluid collection associated with the necrotizing pancreatitis. IMPRESSION: Status post through the tube cholangiogram, with routine exchange of gallbladder fossa percutaneous drain and internal/external biliary drain, with 12 French drains placed to gravity. Signed, Dulcy Fanny. Dellia Nims, RPVI [facility] Electronically Signed   By: Corrie Mckusick D.O.   On: 06/07/2020 14:59   IR EXCHANGE BILIARY DRAIN  Result Date: 06/07/2020 INDICATION: 64 year old male with a prior subtotal cholecystectomy 01/24/2020 with biliary leak and prior common bile duct stenting. The patient is now admitted with pancreatitis The patient has required percutaneous drainage of gallbladder fossa fluid, as well as internal external biliary drainage given the occluded duct stent. EXAM: Through the tube cholangiogram Routine exchange of internal external biliary drain Routine exchange of percutaneous right upper quadrant fluid collection drain MEDICATIONS: NONE ANESTHESIA/SEDATION: Moderate (conscious) sedation was employed during this procedure. A total of Versed 2.0 mg and Fentanyl 100 mcg was administered intravenously. Moderate Sedation Time: 17 minutes. The patient's level of consciousness and vital signs were monitored continuously by radiology nursing throughout the procedure under my direct supervision. FLUOROSCOPY TIME:  Fluoroscopy Time: 2 minutes 54 seconds (75 mGy). COMPLICATIONS: NONE PROCEDURE: Informed written consent was obtained from the patient after a thorough discussion of the procedural risks, benefits and alternatives. All questions were addressed. Maximal Sterile Barrier Technique was utilized including caps, mask, sterile gowns, sterile gloves, sterile drape, hand hygiene and skin antiseptic. A timeout was performed prior to the initiation of  the procedure. Patient positioned supine position. The right upper quadrant including the drain system were prepped and draped in the usual sterile fashion 1% lidocaine was used for local anesthesia. Contrast was first injected through the internal external biliary drain confirming occlusion. A combination of a Bentson wire and a 40 cm Kumpe the catheter were used to navigate the wire cross the occluded duct stent into the duodenum. Wire was removed and contrast injected confirmed location within the duodenum. We then injected the fossa drain, which confirmed a patent cystic duct communicating with the extrahepatic common bile duct. Contrast appeared to accumulate adjacent to the covered stent within the common hepatic duct. The fossa drain was then routinely exchange with modified Seldinger technique for any 12 [place] drain. A new 12 French internal/external biliary drain was advanced into the duodenum on the wire. The drain was appropriately positioned and a final image was stored. Patient remained hemodynamically stable throughout. No complications were encountered and no significant blood loss. FINDINGS: Injection of contrast through the gallbladder fossa drain confirms a patent cystic duct to the biliary system with contrast accumulating adjacent to the proximal covered stent in the common hepatic duct. No evidence of a fistula to the duodenum or the small bowel. Contrast accumulating adjacent to the gallbladder fossa is believed to be retroperitoneal, within the fluid collection associated with the necrotizing pancreatitis. IMPRESSION: Status post through the tube cholangiogram, with routine exchange of gallbladder fossa percutaneous drain and internal/external biliary drain, with 12 French drains placed to gravity. Signed, Dulcy Fanny. Dellia Nims, RPVI [facility] Electronically Signed   By: Corrie Mckusick D.O.   On: 06/07/2020 14:59     Anti-infectives: Anti-infectives (From admission, onward)   Start     Dose/Rate Route Frequency Ordered  Stop   06/04/20 1400  Ampicillin-Sulbactam (UNASYN) 3 g in sodium chloride 0.9 % 100 mL IVPB        3 g 200 mL/hr over 30 Minutes Intravenous Every 6 hours 06/04/20 0906     06/02/20 0300  meropenem (MERREM) 2 g in sodium chloride 0.9 % 100 mL IVPB  Status:  Discontinued        2 g 200 mL/hr over 30 Minutes Intravenous Every 8 hours 06/02/20 0248 06/04/20 0906   06/02/20 0300  vancomycin (VANCOCIN) IVPB 1000 mg/200 mL premix  Status:  Discontinued        1,000 mg 200 mL/hr over 60 Minutes Intravenous Every 12 hours 06/02/20 0248 06/04/20 0906   06/01/20 1000  piperacillin-tazobactam (ZOSYN) IVPB 3.375 g  Status:  Discontinued        3.375 g 12.5 mL/hr over 240 Minutes Intravenous Every 8 hours 06/01/20 0952 06/02/20 0248   05/29/20 0800  piperacillin-tazobactam (ZOSYN) IVPB 3.375 g        3.375 g 12.5 mL/hr over 240 Minutes Intravenous To Radiology 05/28/20 1328 05/29/20 1943   05/04/20 1230  meropenem (MERREM) 1 g in sodium chloride 0.9 % 100 mL IVPB  Status:  Discontinued        1 g 200 mL/hr over 30 Minutes Intravenous Every 8 hours 05/04/20 1156 05/19/20 0826   05/01/20 1400  piperacillin-tazobactam (ZOSYN) IVPB 3.375 g        3.375 g 12.5 mL/hr over 240 Minutes Intravenous Every 8 hours 05/01/20 1013 05/03/20 0907   04/29/20 2000  vancomycin (VANCOCIN) IVPB 1000 mg/200 mL premix  Status:  Discontinued        1,000 mg 200 mL/hr over 60 Minutes Intravenous Every 12 hours 04/29/20 0653 04/30/20 0831   04/29/20 0745  vancomycin (VANCOREADY) IVPB 1500 mg/300 mL        1,500 mg 150 mL/hr over 120 Minutes Intravenous  Once 04/29/20 0653 04/29/20 1129   04/26/20 2300  piperacillin-tazobactam (ZOSYN) IVPB 3.375 g        3.375 g 12.5 mL/hr over 240 Minutes Intravenous Every 8 hours 04/26/20 1716 05/01/20 0559   04/26/20 1700  piperacillin-tazobactam (ZOSYN) IVPB 3.375 g         3.375 g 100 mL/hr over 30 Minutes Intravenous  Once 04/26/20 1655 04/26/20 1844       Assessment/Plan 64 yo male s/p subtotal cholecystectomy, complicated by persistent bile leak, 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 S/p PTBD placement by IR. - Keep PTBD and gallbladder fossa drains to gravity - Trend LFTs daily - Remain NPO with TPN, hold tube feeds given reflux into NG tube. NJ appears to be in correct position on CT scan from yesterday. - Do not recommend any interventions at this time on peripancreatic fluid. Biliary fistula is currently controlled with PTBD. Patient needs continued supportive care, aggressive PT/OT, and optimization of nutrition. - Surgery will continue to follow closely   LOS: 43 days    Michaelle Birks, MD [facility] Surgery General, Hepatobiliary and Pancreatic Surgery 06/09/20  10:57 AM

## 2020-06-09 NOTE — Progress Notes (Signed)
Daily Progress Note   Patient Name: Adam Fowler       Date: 06/09/2020 DOB: 26-Jan-1956  Age: 64 y.o. MRN#: [id number] Attending Physician: Chesley Mires, MD Primary Care Physician: Melony Overly, MD Admit Date: 04/26/2020  Reason for Consultation/Follow-up: symptom management and to discuss complex medical decision making related to patient's goals of care  Subjective: Patient denies abdominal pain (which surprises RN).  He requests ice.  He is able to answer simple questions and make his needs known.  He reports multiple stools today.  RN mentions they are not diarrhea but rather pasty in consistency.   Assessment: More awake and alert today in no distress.  3+ pitting edema in bilateral lower extremities.  Abdomen distended and tight.  Both cor trak and N/G in place.  Tube feeds on hold.  Receiving TPN.  Has not been out of bed to chair yet today.   Patient Profile/HPI:  64 y.o.malewith past medical history of HTN, insomnia, kidney stoneswho was admitted on 8/19/2021with post ERCP pancreatitis.He had developed gangrenous cholecystitis in May and underwent two ERCPs 5/15 and 5/17 (for stent placement). On 5/18 he had a subtotal cholecystectomy and placement of a JP drain. On 5/27 he was noted to have a 8.6 cm fluid collection in the gall bladder fossa and on 6/7 this collection had developed air bubbles and a second collection had formed along the right liver edge. Previous drains were removed a a new drain was placed by IR. On 7/14 an ERCP was done to place a CBD stent. On 8/18 he underwent ERCP for FCSEMS placement for persistent bile leak. On 8/21 - 8/24 he decompensated with respiratory failure (bilateral pleural effusion), Afib with RVR and required ventilation support for  volume overload and aspiration pneumonia. On 9/9 an ERCP was attempted but unsuccessful due to gastric outlet obstruction and TPN was started on 9/10. On 9/23 he suffered acute decompensation with abdominal pain and hypotension (63/41, pulse 130s). Currently he remains in ICU. He has gradually become more debilitated and has suffered episodes of delirium.    Length of Stay: 43   Vital Signs: BP 115/72   Pulse (!) 101   Temp 98.1 F (36.7 C) (Axillary)   Resp 18   Ht 5\' 9"  (1.753 m)   Wt 87.6 kg   SpO2 100%  BMI 28.52 kg/m  SpO2: SpO2: 100 % O2 Device: O2 Device: Nasal Cannula O2 Flow Rate: O2 Flow Rate (L/min): 3 L/min       Palliative Assessment/Data:40%     Palliative Care Plan    Recommendations/Plan:  PMT will continue to follow supportively for symptom management and goals of care conversations.  Code Status:  Full code  Prognosis:   Unable to determine.  Unfortunately he is at high risk for acute decline or even death due to necrotizing pancreatitis.   Discharge Planning:  To Be Determined  Care plan was discussed with patient and ICU RN.  Thank you for allowing the Palliative Medicine Team to assist in the care of this patient.  Total time spent:  15 min.     Greater than 50%  of this time was spent counseling and coordinating care related to the above assessment and plan.  Florentina Jenny, PA-C Palliative Medicine  Please contact Palliative MedicineTeam phone at [phone] for questions and concerns between 7 am - 7 pm.   Please see AMION for individual provider pager numbers.

## 2020-06-09 NOTE — Progress Notes (Signed)
NAME:  Adam Fowler, MRN:  [id number], DOB:  10-06-1955, LOS: 80 ADMISSION DATE:  04/26/2020, CONSULTATION DATE:  9/23 REFERRING MD:  Wyline Copas, CHIEF COMPLAINT:  Hypotension   Brief History   64 yo M s/p subtotal cholecystectomy c/b persistent bile leak and biliary fistula, post ERCP necrotizing pancreatitis-- developed hypotension 9/23 and CCM consulted   Past Medical History  Kidney stones HTN Insomnia S/p cholecystectomy and abscess  Pancreatitis   Significant Hospital Events   8/20 admit 8/21-24 admitted 9/21 perc bilary drainage  9/23 drain capped. Acute decompensation in afternoon. Fluid resuscitation, starting on pressors and transferring to ICU  9/27 started fentanyl infusion, improvement in pain control; concern for pill fragments in GB fossa drain 9/28 placed blue food coloring in NG tube, not seen in GB fossa drain 9/29 started trophic tube feedings 9/30 injection of GB drains  Consults:  GI CCS IR Palliative care  Procedures:  9/3 GB fossa drain 9/21 cholangiogram and biliary drain placement  Significant Diagnostic Tests:   9/18 ct a/p w/con> moderate peripancreatic fluid extending into gastrohepatic ligament. Possible pseudocyst. Pigtail drainage catheter tip in region of gallbladder fossa. CBD stent. Persistent bibasilar opacities. L pleural effusion. Moderate ascites. Colonic diverticulosis 9/23 ct a/p w con>>>again noted are findings of acute pancreatitis with relatively  stable peripancreatic fluid collections.  Findings consistent with a gastric ileus. The esophagus is dilated and fluid-filled to at least the mid thorax. This patient would likely benefit from NG tube decompression. Worsening airspace opacities at the lung bases concerning for developing infiltrates or aspiration. Moderate-sized left-sided pleural effusion with at least partial collapse of the left lower lobe. There is a trace to small right-sided pleural effusion with mild adjacent atelectasis.   Stable small volume abdominal ascites.  Well-positioned internal/external biliary drain. Unchanged cholecystostomy tube.  10/01 CT abd/pelvis >> sequela of necrotic pancreatitis with extensive fluid collection, moderate ascites, mod b/l effusions, basilar ATX  Micro Data:  8/19 SARS COV 2 > neg 8/19 blood > negative 9/10 body fluid culture > negative 9/23 blood > enterococcus faecalis, klebsiella 9/25 blood > negative  Antimicrobials:  9/21 zosyn x1 9/24 mero > 9/26 9/24 zosyn x1 9/24 vanc > 9/26 9/27 unasyn >   Interim history/subjective:  Had large BM this AM.  Breathing better.    Objective   Blood pressure 118/71, pulse (!) 103, temperature 99.6 F (37.6 C), temperature source Axillary, resp. rate (!) 24, height 5\' 9"  (1.753 m), weight 87.6 kg, SpO2 99 %.        Intake/Output Summary (Last 24 hours) at 06/09/2020 0912 Last data filed at 06/09/2020 0841 Gross per 24 hour  Intake 3387.63 ml  Output 3125 ml  Net 262.63 ml   Filed Weights   06/04/20 0500 06/07/20 0427 06/08/20 0459  Weight: 88.9 kg 88.9 kg 87.6 kg    Examination:  General - alert Eyes - pupils reactive ENT - no sinus tenderness, no stridor Cardiac - regular rate/rhythm, no murmur Chest - equal breath sounds b/l, no wheezing or rales Abdomen - mild distention, drains in place Extremities - 2+ edema Skin - no rashes Neuro - moves extremities, follows commands   Resolved Hospital Problem list   Septic shock from peritonitis and bacteremia, Acute hypoxic respiratory failure, Acute metabolic encephalopathy 2nd to sepsis  Assessment & Plan:   Persistent bile leak after cholecystectomy on 01/24/20 with 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 Severe protein calorie malnutrition. - biliary drains  per CCS, GI, IR - tube feeds up to 30 ml/hr on 10/02; continue TPN for now per CCS  Peritonitis with Enterococcal and  Klebsiella bacteremia. - ABx per ID - will need TEE at some point when more stable  Reflux esophagitis. - protonix BID  Depression, anxiety, chronic pain, insomnia. - palliative care consulted - mental status better on 10/02 after klonopin changed to 0.25 mg bid with prn ativan - continue duragesic, lidoderm patch, zoloft  Hypercholesterolemia - continue pravachol  Hyperglycemia. - SSI  Urine retention. - flomax  Anemia of critical illness and chronic disease. - f/u CBC - transfuse for Hb < 7 or significant bleeding - check iron levels   Best practice:  Diet: TPN, tube feeds DVT prophylaxis: lovenox GI prophylaxis: BID protonix Mobility: out of bed as able Code Status: full  Labs    CMP Latest Ref Rng & Units 06/09/2020 06/07/2020 06/06/2020  Glucose 70 - 99 mg/dL 186(H) 183(H) 150(H)  BUN 8 - 23 mg/dL 35(H) 33(H) 29(H)  Creatinine 0.61 - 1.24 mg/dL 0.66 0.72 0.69  Sodium 135 - 145 mmol/L 143 142 140  Potassium 3.5 - 5.1 mmol/L 4.1 4.4 3.9  Chloride 98 - 111 mmol/L 112(H) 112(H) 111  CO2 22 - 32 mmol/L 23 21(L) 21(L)  Calcium 8.9 - 10.3 mg/dL 8.7(L) 8.7(L) 8.6(L)  Total Protein 6.5 - 8.1 g/dL 6.3(L) 6.1(L) 5.8(L)  Total Bilirubin 0.3 - 1.2 mg/dL 0.7 1.0 0.9  Alkaline Phos 38 - 126 U/L 232(H) 268(H) 187(H)  AST 15 - 41 U/L 23 32 22  ALT 0 - 44 U/L 28 25 23     CBC Latest Ref Rng & Units 06/09/2020 06/07/2020 06/06/2020  WBC 4.0 - 10.5 K/uL 20.5(H) 21.4(H) 17.7(H)  Hemoglobin 13.0 - 17.0 g/dL 7.5(L) 8.5(L) 8.0(L)  Hematocrit 39 - 52 % 26.4(L) 29.4(L) 27.6(L)  Platelets 150 - 400 K/uL 306 286 258    ABG    Component Value Date/Time   PHART 7.295 (L) 05/31/2020 1625   PCO2ART 38.6 05/31/2020 1625   PO2ART 62.0 (L) 05/31/2020 1625   HCO3 18.0 (L) 05/31/2020 1625   TCO2 26 04/30/2020 0241   ACIDBASEDEF 7.1 (H) 05/31/2020 1625   O2SAT 87.5 05/31/2020 1625    CBG (last 3)  Recent Labs    06/08/20 2320 06/09/20 0323 06/09/20 0719  GLUCAP 155* 156* 180*     Signature:  Chesley Mires, MD [place] Pager - [phone] 06/09/2020, 9:12 AM

## 2020-06-10 DIAGNOSIS — G9340 Encephalopathy, unspecified: Secondary | ICD-10-CM

## 2020-06-10 LAB — BASIC METABOLIC PANEL
Anion gap: 8 (ref 5–15)
BUN: 36 mg/dL — ABNORMAL HIGH (ref 8–23)
CO2: 23 mmol/L (ref 22–32)
Calcium: 8.5 mg/dL — ABNORMAL LOW (ref 8.9–10.3)
Chloride: 111 mmol/L (ref 98–111)
Creatinine, Ser: 0.68 mg/dL (ref 0.61–1.24)
GFR calc Af Amer: >60 mL/min (ref >60)
GFR calc non Af Amer: >60 mL/min (ref >60)
Glucose, Bld: 165 mg/dL — ABNORMAL HIGH (ref 70–99)
Potassium: 3.9 mmol/L (ref 3.5–5.1)
Sodium: 142 mmol/L (ref 135–145)

## 2020-06-10 LAB — GLUCOSE, CAPILLARY
Glucose-Capillary: 146 mg/dL — ABNORMAL HIGH (ref 70–99)
Glucose-Capillary: 149 mg/dL — ABNORMAL HIGH (ref 70–99)
Glucose-Capillary: 152 mg/dL — ABNORMAL HIGH (ref 70–99)
Glucose-Capillary: 157 mg/dL — ABNORMAL HIGH (ref 70–99)
Glucose-Capillary: 159 mg/dL — ABNORMAL HIGH (ref 70–99)
Glucose-Capillary: 161 mg/dL — ABNORMAL HIGH (ref 70–99)

## 2020-06-10 LAB — CBC
HCT: 25.9 % — ABNORMAL LOW (ref 39.0–52.0)
Hemoglobin: 7.2 g/dL — ABNORMAL LOW (ref 13.0–17.0)
MCH: 25.3 pg — ABNORMAL LOW (ref 26.0–34.0)
MCHC: 27.8 g/dL — ABNORMAL LOW (ref 30.0–36.0)
MCV: 90.9 fL (ref 80.0–100.0)
Platelets: 302 10*3/uL (ref 150–400)
RBC: 2.85 MIL/uL — ABNORMAL LOW (ref 4.22–5.81)
RDW: 15.9 % — ABNORMAL HIGH (ref 11.5–15.5)
WBC: 18.1 10*3/uL — ABNORMAL HIGH (ref 4.0–10.5)
nRBC: 0 % (ref 0.0–0.2)

## 2020-06-10 MED ORDER — PANTOPRAZOLE SODIUM 40 MG PO PACK
40.0000 mg | PACK | Freq: Two times a day (BID) | ORAL | Status: DC
  Administered 2020-06-10 – 2020-06-14 (×9): 40 mg
  Filled 2020-06-10 (×9): qty 20

## 2020-06-10 MED ORDER — TRAVASOL 10 % IV SOLN
INTRAVENOUS | Status: AC
  Filled 2020-06-10: qty 1440

## 2020-06-10 MED ORDER — SODIUM CHLORIDE 0.9 % IV SOLN
510.0000 mg | INTRAVENOUS | Status: AC
  Administered 2020-06-10 – 2020-06-13 (×2): 510 mg via INTRAVENOUS
  Filled 2020-06-10 (×2): qty 17

## 2020-06-10 NOTE — Progress Notes (Signed)
NAME:  Adam Fowler, MRN:  [id number], DOB:  [birth year], LOS: 17 ADMISSION DATE:  04/26/2020, CONSULTATION DATE:  9/23 REFERRING MD:  Wyline Copas, CHIEF COMPLAINT:  Hypotension   Brief History   64 yo M s/p subtotal cholecystectomy c/b persistent bile leak and biliary fistula, post ERCP necrotizing pancreatitis-- developed hypotension 9/23 and CCM consulted   Past Medical History  Kidney stones HTN Insomnia S/p cholecystectomy and abscess  Pancreatitis   Significant Hospital Events   8/20 admit 8/21-24 admitted 9/21 perc bilary drainage  9/23 drain capped. Acute decompensation in afternoon. Fluid resuscitation, starting on pressors and transferring to ICU  9/27 started fentanyl infusion, improvement in pain control; concern for pill fragments in GB fossa drain 9/28 placed blue food coloring in NG tube, not seen in GB fossa drain 9/29 started trophic tube feedings 9/30 injection of GB drains 10/2 tube feeds held due to concern about reflux 10/3 transfer to progressive care  Consults:  GI CCS IR Palliative care  Procedures:  9/3 GB fossa drain 9/21 cholangiogram and biliary drain placement  Significant Diagnostic Tests:   9/18 ct a/p w/con> moderate peripancreatic fluid extending into gastrohepatic ligament. Possible pseudocyst. Pigtail drainage catheter tip in region of gallbladder fossa. CBD stent. Persistent bibasilar opacities. L pleural effusion. Moderate ascites. Colonic diverticulosis 9/23 ct a/p w con>>>again noted are findings of acute pancreatitis with relatively  stable peripancreatic fluid collections.  Findings consistent with a gastric ileus. The esophagus is dilated and fluid-filled to at least the mid thorax. This patient would likely benefit from NG tube decompression. Worsening airspace opacities at the lung bases concerning for developing infiltrates or aspiration. Moderate-sized left-sided pleural effusion with at least partial collapse of the left lower lobe.  There is a trace to small right-sided pleural effusion with mild adjacent atelectasis.  Stable small volume abdominal ascites.  Well-positioned internal/external biliary drain. Unchanged cholecystostomy tube.  10/01 CT abd/pelvis >> sequela of necrotic pancreatitis with extensive fluid collection, moderate ascites, mod b/l effusions, basilar ATX  Micro Data:  8/19 SARS COV 2 > neg 8/19 blood > negative 9/10 body fluid culture > negative 9/23 blood > enterococcus faecalis, klebsiella 9/25 blood > negative  Antimicrobials:  9/21 zosyn x1 9/24 mero > 9/26 9/24 zosyn x1 9/24 vanc > 9/26 9/27 unasyn >   Interim history/subjective:  Had several BMs overnight.    Objective   Blood pressure 127/80, pulse 96, temperature 98.2 F (36.8 C), temperature source Oral, resp. rate (!) 22, height 5\' 9"  (1.753 m), weight 87.6 kg, SpO2 97 %.        Intake/Output Summary (Last 24 hours) at 06/10/2020 0815 Last data filed at 06/10/2020 0700 Gross per 24 hour  Intake 3387.55 ml  Output 2730 ml  Net 657.55 ml   Filed Weights   06/04/20 0500 06/07/20 0427 06/08/20 0459  Weight: 88.9 kg 88.9 kg 87.6 kg    Examination:  General - alert Eyes - pupils reactive ENT - no sinus tenderness, no stridor Cardiac - regular rate/rhythm, no murmur Chest - decreased BS at based Abdomen - mild distention, soft, dull on percussion Extremities - no edema Skin - no rashes Neuro - follows commands  Resolved Hospital Problem list   Septic shock from peritonitis and bacteremia, Acute hypoxic respiratory failure, Acute metabolic encephalopathy 2nd to sepsis  Assessment & Plan:   Persistent bile leak after cholecystectomy on 01/24/20 with 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 Severe protein calorie malnutrition. -  tube feeds held 10/02 due to concern about refluxing into NG tube - continue TPN - biliary drains per CCS,  GI, IR  Peritonitis with Enterococcal and Klebsiella bacteremia. - ABx per ID - will need TEE at some point when more stable  Reflux esophagitis. - protonix BID  Depression, anxiety, chronic pain, insomnia. - palliative care consulted for symptom management - continue klonopin, duragesic, lidoderm patch, zoloft  Hypercholesterolemia - continue pravachol  Hyperglycemia. - SSI  Urine retention. - flomax  Anemia of critical illness, chronic disease, and iron deficiency. - f/u CBC - IV iron per pharmacy - transfuse for Hb < 7 or significant bleeding  Decondintioning. - PT/OT   Best practice:  Diet: TPN DVT prophylaxis: lovenox GI prophylaxis: BID protonix Mobility: out of bed as able Code Status: full Disposition: progressive care  To [facility] 10/4 as primary service and PCCM off.  Labs    CMP Latest Ref Rng & Units 06/10/2020 06/09/2020 06/07/2020  Glucose 70 - 99 mg/dL 165(H) 186(H) 183(H)  BUN 8 - 23 mg/dL 36(H) 35(H) 33(H)  Creatinine 0.61 - 1.24 mg/dL 0.68 0.66 0.72  Sodium 135 - 145 mmol/L 142 143 142  Potassium 3.5 - 5.1 mmol/L 3.9 4.1 4.4  Chloride 98 - 111 mmol/L 111 112(H) 112(H)  CO2 22 - 32 mmol/L 23 23 21(L)  Calcium 8.9 - 10.3 mg/dL 8.5(L) 8.7(L) 8.7(L)  Total Protein 6.5 - 8.1 g/dL - 6.3(L) 6.1(L)  Total Bilirubin 0.3 - 1.2 mg/dL - 0.7 1.0  Alkaline Phos 38 - 126 U/L - 232(H) 268(H)  AST 15 - 41 U/L - 23 32  ALT 0 - 44 U/L - 28 25    CBC Latest Ref Rng & Units 06/10/2020 06/09/2020 06/07/2020  WBC 4.0 - 10.5 K/uL 18.1(H) 20.5(H) 21.4(H)  Hemoglobin 13.0 - 17.0 g/dL 7.2(L) 7.5(L) 8.5(L)  Hematocrit 39 - 52 % 25.9(L) 26.4(L) 29.4(L)  Platelets 150 - 400 K/uL 302 306 286    ABG    Component Value Date/Time   PHART 7.295 (L) 05/31/2020 1625   PCO2ART 38.6 05/31/2020 1625   PO2ART 62.0 (L) 05/31/2020 1625   HCO3 18.0 (L) 05/31/2020 1625   TCO2 26 04/30/2020 0241   ACIDBASEDEF 7.1 (H) 05/31/2020 1625   O2SAT 87.5 05/31/2020 1625    CBG (last  3)  Recent Labs    06/09/20 1934 06/09/20 2317 06/10/20 0344  GLUCAP 141* 152* 159*    Signature:  Chesley Mires, MD [place] Pager - [phone] 06/10/2020, 8:15 AM

## 2020-06-10 NOTE — Progress Notes (Signed)
PHARMACY - TOTAL PARENTERAL NUTRITION CONSULT NOTE  Indication:  Severe pancreatitis, biliary fistula, GOO  Patient Measurements: Height: _0  (175.3 cm) Weight: 87.6 kg (193 lb 2 oz) IBW/kg (Calculated) : 70.7 TPN AdjBW (KG): 86.1 Body mass index is 28.52 kg/m.  Assessment:  83 YOM admitted 04/26/20, post-ERCP 8/18 and biliary stent exchange for persistent postoperative bile leak related to recent cholecystectomy with abscess s/p percutaneous drainage. Patient developed acute pancreatitis, has had a prolonged hospitalization complicated by VDRF (extubated 8/24), AKI, encephalopathy, and afib. TPN initiated 9/11 for acute post-ERCP pancreatitis/ileus and persistent biliary leak. Significant gastric fluid retention from functional outlet obstruction. Patient acutely decompensated 9/23 resulted in pressors, non-rebreather and septic shock.   10/3: TF held midday on 10/2   Glucose / Insulin: A1c 5.6% - CBGs <180. Required 22 units rSSI + 15 units in TPN.  Electrolytes: K 3.9 (goal >/= 4 for ileus), Cl 111 high-normal. CO2 23. (max acetate in TPN), CoCa ~10.4 Renal: SCr < 1, BUN down 33 LFTs / TGs: LFTs WNL except alk phos down to 232, tbili WNL, TG mildly elevated 180 Prealbumin / albumin: Prealbumin 8.2 (stable). Albumin 2.0 Intake / Output; MIVF: UOP 0.3 mL/kg/hr (down), NG down 234m, drain up to 5040m net +0.69L (since 9/19).  LBM 10/2.    GI Imaging:  9/5: CT - severe pancreatitis 9/7: HIDA - persistent bile leak with accumulations/paracolic gutter 9/9: CT - pancreatitis (peripancreatic fat stranding and peripancreatic fluid, percutaneous drain at the gallbladder fossa, unchanged biliary stent with no intrahepatic biliary duct dilatation, bilateral pleural effusions 9/15: KUB - diffuse colonic stool, no bowel obstruction/free air 9/18: CT - mild colonic diverticulosis, pseudocyst involvement and less likely abscess 9/23: KUB - no evidence of bowel obstruction/ileus 9/23: CT -  Gastric ileus, dilated/fluid-filled esophagus, small volume abd ascites 9/26: KUB - Suspect ascites and diffuse body wall edema 10/1: CT - sequela of necrotic pancreatitis with extensive fluid collection, moderate ascites, mod b/l effusions, basilar ATX Surgeries / Procedures: 8/18: ERCP/biliary stent exchange 9/9: Unsuccessful ERCP 9/10: Paracentesis - 1.4L out 9/21: EGD- Esophagitis w/ active oozing and stigmata of recent bleeding, gastritis w/ hemorrhage, congested duodenal mucosa; NGT trauma  9/21: Paracentesis/biliary drain placement/cholangiogram/drain check- 3L removed 9/30: GB fossa drain occluded, exchanged and upsized to 42F  Central access: PICC placed 05/19/20 TPN start date: 05/19/20  Nutritional Goals (per RD rec on 05/18/20): kCal: 2300-2500, Protein: 130-145g, Fluid: >2.3L/day  Current Nutrition: TPN - goal rate is 10071mr, providing 144g AA, 336g CHO and 67g ILE for a total of 2390 kCal 9/29- Trickle feeds initiated, held on 10/2 Vital 1.5 at 64m46m (goal rate of 65mL33m incr 10 ml/day max) -provides 2340 kcal, 105g AA, 84g ILE, 292g CHO at goal.  ProSource 45 ml TID (11 g protein, 40 kcal per packet) - 3 given on 10/3  Plan:  Continue TPN at goal rate of 100mL/104mproviding 144g AA, 336g CHO and 67g ILE for a total of 2390 kCal, meeting 100% of needs  Electrolytes in TPN: Na 64mEq/72m 45mEq/L14mg 4mEq/L, 23ms 7mmol/L, 66mremoved, max acetate  Add standard MVI and trace elements to TPN  Continue resistant SSI Q4H + 15 units regular insulin in TPN  F/u restart of TF and ability to titrate  Thank you for involving pharmacy in this patient's care.  Shenee Wignall DRenold GentaBCPS Clinical Pharmacist Clinical phone for 06/10/2020 until 3p is [phone] [id number]/[id number] 7:07 AM  **Pharmacist phone directory can be found on [email or web address] [place] under [facility]

## 2020-06-10 NOTE — Progress Notes (Signed)
Daily Progress Note   Patient Name: Adam Fowler       Date: 06/10/2020 DOB: Aug 23, 1956  Age: 64 y.o. MRN#: [id number] Attending Physician: Chesley Mires, MD Primary Care Physician: Melony Overly, MD Admit Date: 04/26/2020  Reason for Consultation/Follow-up: To discuss complex medical decision making related to patient's goals of care  Subjective:  Adam Fowler seems agitated when I visit him today.  Yesterday he spoke to me in sentences and was co-herent.  Today I'm unable to understand most of what he is trying to communicate.   I spoke with [name] today.  She is working to file paperwork for him to keep insurance and perhaps apply for disability.  She asks for a social work consult in order to get some guidance with this process. We discuss Adam Fowler's apparent lack of progress.  [name] is very concerned about a transfer outside of the ICU.  She feels there is continuity of care with the CCM MDs and ICU RNs and that Adam Fowler is too fragile to be cared for in a different hospital unit just yet.  I suggest to her that a sitter or family member at bedside would be helpful.  [name] explains that Adam Fowler refuses to allow his siblings or other family members to visit him and she is 45 minutes away.  She is open to a sitter or tele sitter if it will help keep him calm.  Assessment: Patient with out significant change - more agitated and confused today.   Patient Profile/HPI:  64 y.o. male  with past medical history of HTN, insomnia, kidney stones who was admitted on 04/26/2020 with  post ERCP pancreatitis.  He had developed gangrenous cholecystitis in May and underwent two ERCPs 5/15 and 5/17 (for stent placement).  On 5/18 he had a subtotal cholecystectomy and placement of a JP drain.  On 5/27 he was noted to have a 8.6 cm  fluid collection in the gall bladder fossa and on 6/7 this collection had developed air bubbles and a second collection had formed along the right liver edge.  Previous drains were removed a a new drain was placed by IR.  On 7/14 an ERCP was done to place a CBD stent.  On 8/18 he underwent ERCP for FCSEMS placement for persistent bile leak.  On 8/21 - 8/24 he decompensated with respiratory  failure (bilateral pleural effusion), Afib with RVR and required ventilation support for volume overload and aspiration pneumonia.  On 9/9 an ERCP was attempted but unsuccessful due to gastric outlet obstruction and TPN was started on 9/10.  On 9/23 he suffered acute decompensation with abdominal pain and hypotension (63/41, pulse 130s).  Currently he remains in ICU.  He has gradually become more debilitated and has suffered episodes of delirium.  His albumin is currently 1.5.   Length of Stay: 44   Vital Signs: BP 136/83   Pulse 98   Temp 99.8 F (37.7 C) (Axillary)   Resp 16   Ht 5\' 9"  (1.753 m)   Wt 87.6 kg   SpO2 98%   BMI 28.52 kg/m  SpO2: SpO2: 98 % O2 Device: O2 Device: Nasal Cannula O2 Flow Rate: O2 Flow Rate (L/min): 3 L/min       Palliative Assessment/Data:  20%     Palliative Care Plan    Recommendations/Plan:  PMT will continue to follow with you.  Code Status:  Full code  Prognosis:   Unable to determine  He is at high risk for acute decline or death secondary to necrotizing pancreatitis, prolonged hospital course, protein calorie malnutrition and progressive debility.   Discharge Planning:  To Be Determined  Care plan was discussed with Wife, ICU RN, communicated with CCM MD.  Thank you for allowing the Palliative Medicine Team to assist in the care of this patient.  Total time spent:  25 min.     Greater than 50%  of this time was spent counseling and coordinating care related to the above assessment and plan.  Florentina Jenny, PA-C Palliative  Medicine  Please contact Palliative MedicineTeam phone at [phone] for questions and concerns between 7 am - 7 pm.   Please see AMION for individual provider pager numbers.

## 2020-06-10 NOTE — Progress Notes (Signed)
[facility] NOTE  Pharmacy Consult for IV iron Indication: anemia  Allergies  Allergen Reactions  . Toradol [Ketorolac Tromethamine] Other (See Comments)    delirium    Patient Measurements: Height: 5\' 9"  (175.3 cm) Weight: 87.6 kg (193 lb 2 oz) IBW/kg (Calculated) : 70.7  Vital Signs: Temp: 99.9 F (37.7 C) (10/03 0800) Temp Source: Oral (10/03 0800) BP: 145/84 (10/03 0800) Pulse Rate: 98 (10/03 0800) Intake/Output from previous day: 10/02 0701 - 10/03 0700 In: 3387.6 [I.V.:2447.6; NG/GT:540; IV Piggyback:399.9] Out: 2730 [Urine:1250; Emesis/NG output:750; Drains:730] Intake/Output from this shift: No intake/output data recorded.  Labs: Recent Labs    06/09/20 0614 06/10/20 0500  WBC 20.5* 18.1*  HGB 7.5* 7.2*  HCT 26.4* 25.9*  PLT 306 302  CREATININE 0.66 0.68  MG 2.3  --   ALBUMIN 1.6*  --   PROT 6.3*  --   AST 23  --   ALT 28  --   ALKPHOS 232*  --   BILITOT 0.7  --    Anemia panel: iron 13, UIBC 210, TIBC 223, saturation ratio 6, ferritin 265  Estimated Creatinine Clearance: 102.3 mL/min (by C-G formula based on SCr of 0.68 mg/dL).  Medical History: Past Medical History:  Diagnosis Date  . History of kidney stones    passed  . Hypertension   . Insomnia     Assessment: 79 yom s/p subtotal cholecystectomy with persistent bile leak and biliary fistula, post ERCP with necrotizing pancreatitis. On TPN for nutrition, TF on hold; also treating Enterococcal and Klebsiella bacteremia. Anemia panel shows need for iron replacement with low iron and TSat, normal ferritin. Hgb low stable in 7-8s.   Plan:  Feraheme 510 mg IV q3 days x2 doses Pharmacy will sign-off, please re-consult if needed  Thank you for involving pharmacy in this patient's care.  Renold Genta, PharmD, BCPS Clinical Pharmacist Clinical phone for 06/10/2020 until 3p is [phone] 06/10/2020 9:22 AM  **Pharmacist phone directory can be found on [email or web address] listed under [place]**

## 2020-06-11 LAB — CBC WITH DIFFERENTIAL/PLATELET
Abs Immature Granulocytes: 0.15 10*3/uL — ABNORMAL HIGH (ref 0.00–0.07)
Basophils Absolute: 0.1 10*3/uL (ref 0.0–0.1)
Basophils Relative: 0 %
Eosinophils Absolute: 0.4 10*3/uL (ref 0.0–0.5)
Eosinophils Relative: 2 %
HCT: 27.3 % — ABNORMAL LOW (ref 39.0–52.0)
Hemoglobin: 7.6 g/dL — ABNORMAL LOW (ref 13.0–17.0)
Immature Granulocytes: 1 %
Lymphocytes Relative: 7 %
Lymphs Abs: 1.2 10*3/uL (ref 0.7–4.0)
MCH: 25.4 pg — ABNORMAL LOW (ref 26.0–34.0)
MCHC: 27.8 g/dL — ABNORMAL LOW (ref 30.0–36.0)
MCV: 91.3 fL (ref 80.0–100.0)
Monocytes Absolute: 1.1 10*3/uL — ABNORMAL HIGH (ref 0.1–1.0)
Monocytes Relative: 7 %
Neutro Abs: 14.1 10*3/uL — ABNORMAL HIGH (ref 1.7–7.7)
Neutrophils Relative %: 83 %
Platelets: 307 10*3/uL (ref 150–400)
RBC: 2.99 MIL/uL — ABNORMAL LOW (ref 4.22–5.81)
RDW: 16.2 % — ABNORMAL HIGH (ref 11.5–15.5)
WBC: 17 10*3/uL — ABNORMAL HIGH (ref 4.0–10.5)
nRBC: 0 % (ref 0.0–0.2)

## 2020-06-11 LAB — COMPREHENSIVE METABOLIC PANEL
ALT: 35 U/L (ref 0–44)
AST: 28 U/L (ref 15–41)
Albumin: 1.6 g/dL — ABNORMAL LOW (ref 3.5–5.0)
Alkaline Phosphatase: 200 U/L — ABNORMAL HIGH (ref 38–126)
Anion gap: 7 (ref 5–15)
BUN: 36 mg/dL — ABNORMAL HIGH (ref 8–23)
CO2: 24 mmol/L (ref 22–32)
Calcium: 8.5 mg/dL — ABNORMAL LOW (ref 8.9–10.3)
Chloride: 112 mmol/L — ABNORMAL HIGH (ref 98–111)
Creatinine, Ser: 0.7 mg/dL (ref 0.61–1.24)
GFR calc Af Amer: >60 mL/min (ref >60)
GFR calc non Af Amer: >60 mL/min (ref >60)
Glucose, Bld: 160 mg/dL — ABNORMAL HIGH (ref 70–99)
Potassium: 4.1 mmol/L (ref 3.5–5.1)
Sodium: 143 mmol/L (ref 135–145)
Total Bilirubin: 1 mg/dL (ref 0.3–1.2)
Total Protein: 6.4 g/dL — ABNORMAL LOW (ref 6.5–8.1)

## 2020-06-11 LAB — PHOSPHORUS: Phosphorus: 4.3 mg/dL (ref 2.5–4.6)

## 2020-06-11 LAB — GLUCOSE, CAPILLARY
Glucose-Capillary: 164 mg/dL — ABNORMAL HIGH (ref 70–99)
Glucose-Capillary: 147 mg/dL — ABNORMAL HIGH (ref 70–99)
Glucose-Capillary: 165 mg/dL — ABNORMAL HIGH (ref 70–99)
Glucose-Capillary: 133 mg/dL — ABNORMAL HIGH (ref 70–99)
Glucose-Capillary: 121 mg/dL — ABNORMAL HIGH (ref 70–99)

## 2020-06-11 LAB — MAGNESIUM: Magnesium: 2.2 mg/dL (ref 1.7–2.4)

## 2020-06-11 LAB — PREALBUMIN: Prealbumin: 9.2 mg/dL — ABNORMAL LOW (ref 18–38)

## 2020-06-11 LAB — TRIGLYCERIDES: Triglycerides: 112 mg/dL (ref <150)

## 2020-06-11 MED ORDER — LIDOCAINE 5 % EX PTCH
1.0000 | MEDICATED_PATCH | CUTANEOUS | Status: DC
  Administered 2020-06-11 – 2020-06-14 (×4): 1 via TRANSDERMAL
  Filled 2020-06-11 (×4): qty 1

## 2020-06-11 MED ORDER — TRAVASOL 10 % IV SOLN
INTRAVENOUS | Status: AC
  Filled 2020-06-11: qty 1440

## 2020-06-11 MED ORDER — WHITE PETROLATUM EX OINT
TOPICAL_OINTMENT | CUTANEOUS | Status: AC
  Filled 2020-06-11: qty 28.35

## 2020-06-11 NOTE — Progress Notes (Signed)
Received report from [employer], Therapist, sports. Patient, wife and belongings arrived to unit. Oriented to unit. Patient lying in bed in no distress with wife at bedside.

## 2020-06-11 NOTE — Progress Notes (Signed)
I examined the patient at bedside this morning. NG and NJ tubes remain in place. NJ feeds on hold due to reflux of feeds into NG tube, as well as persistent abdominal distension. Gallbladder fossa drain is again bilious in character. PTBD appears to be draining appropriately and is bilious in character. Abdomen is distended.  CT scan from 10/1 reviewed. Peripancreatic necrosis is walling off but there is no sign of infection and no drainable collection. PTBD is positioned appropriately within the duodenum. 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   Would not recommend any drainage of the peripancreatic fluid at this time. The bile leak is controlled, however the persistent leakage from the gallbladder remnant has not resolved even with a functioning PTBD. 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 The pigtail drain may be keeping the fistula open, so I will discuss exchange of this pigtail drain for a straight catheter under fluoro by IR, with position of the catheter further away from the remnant gallbladder. This will allow continued drainage of the bile leak but will hopefully allow the fistula to close. I spoke with the patient's wife at bedside today and reiterated that the patient still has months of recovery ahead of him, as is typical with severe acute pancreatitis. In light of SMV occlusion, we will have to be very cautious about enteral feed advancement and the patient will likely be TPN-dependent for weeks to months.  Michaelle Birks, [facility], Hepatobiliary and Pancreatic Surgery 06/11/20 3:56 PM

## 2020-06-11 NOTE — Progress Notes (Signed)
PROGRESS NOTE  Adam Fowler KXF:[id number] DOB: [birth year] DOA: 04/26/2020 PCP: Melony Overly, MD  Brief History   The patient is a 64 yr old man with a past medical history significant for HTN, insomnia, and renal lithiasis. Marland Kitchen He had undergone a cholecystectomy with placement of JP drain on 01/24/2020. On 02/02/2020 he was found to have a fluid collection in the the gallbladder fossa. On 6/7 this collection had developed bubbles and a second collection has formed along the right edge of the liver. At this time previous drains were removed and new drain was placed by IR. CBD stent was placed via ERCP on 03/21/2020. He next presented on 04/25/2020 for repeat ERCP after biliary leak following recent cholecystectomy with abscess after percutaneous drainage. The patient had complained of severe abdominal pain on this presentation. He was admitted with post ERCP pancreatitis. He decompensated 8/21/-24 with respiratory failure due to bilateral pleural effusions, atrial fibrillation with RVR. He required mechanical ventilation for volume overload and aspiration pneumonia. Another ERCP was attempted on 05/17/2020. This was not successful due to GOO. The patient was started on TPN on 9/10. On 05/31/2020 the patient had a decompensation with abdominal pain and hypotension, and he was transferred back to the ICU. Despite TPN and ongoing therapies the patient has become quite weak and debilited.  The patient was transferred out of the ICU to the care of the hospitalists service on 06/11/2020 by PCCM.  Consultants  . Gastroenterology . PCCM . Palliative care  Significant events  8/20 admit 8/21-24 admitted 9/21 perc bilary drainage  9/23 drain capped. Acute decompensation in afternoon. Fluid resuscitation, starting on pressors and transferring to ICU  9/27 started fentanyl infusion, improvement in pain control; concern for pill fragments in GB fossa drain 9/28 placed blue food coloring in NG tube, not seen in GB  fossa drain 9/29 started trophic tube feedings 9/30 injection of GB drains 10/2 tube feeds held due to concern about reflux 10/3 transfer to progressive care  Antibiotics   Anti-infectives (From admission, onward)   Start     Dose/Rate Route Frequency Ordered Stop   06/04/20 1400  Ampicillin-Sulbactam (UNASYN) 3 g in sodium chloride 0.9 % 100 mL IVPB        3 g 200 mL/hr over 30 Minutes Intravenous Every 6 hours 06/04/20 0906     06/02/20 0300  meropenem (MERREM) 2 g in sodium chloride 0.9 % 100 mL IVPB  Status:  Discontinued        2 g 200 mL/hr over 30 Minutes Intravenous Every 8 hours 06/02/20 0248 06/04/20 0906   06/02/20 0300  vancomycin (VANCOCIN) IVPB 1000 mg/200 mL premix  Status:  Discontinued        1,000 mg 200 mL/hr over 60 Minutes Intravenous Every 12 hours 06/02/20 0248 06/04/20 0906   06/01/20 1000  piperacillin-tazobactam (ZOSYN) IVPB 3.375 g  Status:  Discontinued        3.375 g 12.5 mL/hr over 240 Minutes Intravenous Every 8 hours 06/01/20 0952 06/02/20 0248   05/29/20 0800  piperacillin-tazobactam (ZOSYN) IVPB 3.375 g        3.375 g 12.5 mL/hr over 240 Minutes Intravenous To Radiology 05/28/20 1328 05/29/20 1943   05/04/20 1230  meropenem (MERREM) 1 g in sodium chloride 0.9 % 100 mL IVPB  Status:  Discontinued        1 g 200 mL/hr over 30 Minutes Intravenous Every 8 hours 05/04/20 1156 05/19/20 0826   05/01/20 1400  piperacillin-tazobactam (ZOSYN) IVPB 3.375  g        3.375 g 12.5 mL/hr over 240 Minutes Intravenous Every 8 hours 05/01/20 1013 05/03/20 0907   04/29/20 2000  vancomycin (VANCOCIN) IVPB 1000 mg/200 mL premix  Status:  Discontinued        1,000 mg 200 mL/hr over 60 Minutes Intravenous Every 12 hours 04/29/20 0653 04/30/20 0831   04/29/20 0745  vancomycin (VANCOREADY) IVPB 1500 mg/300 mL        1,500 mg 150 mL/hr over 120 Minutes Intravenous  Once 04/29/20 0653 04/29/20 1129   04/26/20 2300  piperacillin-tazobactam (ZOSYN) IVPB 3.375 g        3.375  g 12.5 mL/hr over 240 Minutes Intravenous Every 8 hours 04/26/20 1716 05/01/20 0559   04/26/20 1700  piperacillin-tazobactam (ZOSYN) IVPB 3.375 g        3.375 g 100 mL/hr over 30 Minutes Intravenous  Once 04/26/20 1655 04/26/20 1844    .  Subjective  The patient is sleeping soundly. No new complaints. Wife is at bedside. She is unhappy about the move outside of the ICU as she blames prior transfers out of the unit for the patient's complications.  Objective   Vitals:  Vitals:   06/11/20 1000 06/11/20 1100  BP: 126/84   Pulse: 99 99  Resp: 13 14  Temp: 98 F (36.7 C)   SpO2: 100%     Exam:  Constitutional:  . The patient is sleeping. He is not awakened. He appears acutely ill. No acute distress. Respiratory:  . No increased work of breathing. . No wheezes, rales, or rhonchi . No tactile fremitus Cardiovascular:  . Regular rate and rhythm . No murmurs, ectopy, or gallups. . No lateral PMI. No thrills. Abdomen:  . Abdomen is soft, non-tender, non-distended . No hernias, masses, or organomegaly . Normoactive bowel sounds.  Musculoskeletal:  . No cyanosis or clubbing . 2-3+ pitting edema of lower extremities bilaterally. Skin:  . No rashes, lesions, ulcers . palpation of skin: no induration or nodules Neurologic:  . CN 2-12 intact . Sensation all 4 extremities intact Psychiatric:  . Mental status o Mood, affect appropriate o Orientation to person, place, time  . judgment and insight appear intact  I have personally reviewed the following:   Today's Data  . Vitals, CMP, CBC  Micro Data  . Blood cultures x 2: No growth  Imaging  CT abdomen and pelvis: (06/08/2020) : 1. Sequela of necrotic pancreatitis with extensive fluid collection. The collections are unchanged in extent and have contracted with matured organization. 2. Moderate ascites. 3. Moderate bilateral pleural effusion, progressed on the right. 4. Bilateral lower lobe atelectasis.  Scheduled  Meds: . Chlorhexidine Gluconate Cloth  6 each Topical Daily  . clonazePAM  0.25 mg Per Tube BID  . enoxaparin (LOVENOX) injection  40 mg Subcutaneous Q24H  . feeding supplement (PROSource TF)  45 mL Per Tube TID  . fentaNYL  1 patch Transdermal Q72H  . influenza vac split quadrivalent PF  0.5 mL Intramuscular Tomorrow-1000  . insulin aspart  0-20 Units Subcutaneous Q4H  . lidocaine  1 patch Transdermal Q24H  . mouth rinse  15 mL Mouth Rinse BID  . pantoprazole sodium  40 mg Per Tube BID  . pravastatin  20 mg Per Tube q1800  . sertraline  100 mg Per Tube Daily  . sodium chloride flush  10-40 mL Intracatheter Q12H  . sodium chloride flush  5 mL Intracatheter Q8H  . sodium chloride flush  5 mL Intracatheter  Q8H  . tamsulosin  0.4 mg Oral Daily   Continuous Infusions: . sodium chloride 10 mL/hr at 06/11/20 1000  . ampicillin-sulbactam (UNASYN) IV 3 g (06/11/20 1326)  . feeding supplement (VITAL 1.5 CAL) Stopped (06/09/20 1100)  . ferumoxytol Stopped (06/10/20 1218)  . TPN ADULT (ION) 100 mL/hr at 06/11/20 1000  . TPN ADULT (ION)      Principal Problem:   Post-ERCP acute pancreatitis Active Problems:   Severe sepsis (HCC)   Polycythemia   AKI (acute kidney injury) ([place])   Metabolic acidosis   Acute respiratory failure with hypoxia (HCC)   Bilateral pleural effusion   Ileus (HCC)   Bile duct leak   Gastric outlet obstruction   Abdominal distention   ARDS (adult respiratory distress syndrome) ([place])   Biloma following surgery   Acute blood loss anemia   Palliative care encounter   Chest pain   Acute cholecystitis   Encephalopathy acute   LOS: 45 days   A & P  Persistent bile leak after cholecystectomy on 01/24/20 with 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 Severe protein calorie malnutrition: Tube feeds held 10/02 due to concern about refluxing into NG tube. Continue TPN. Biliary drains  per CCS, GI, IR. I appreciate their assistance.  Peritonitis with Enterococcal and Klebsiella bacteremia: IV Unasyn as per infectious disease. He will need TEE at some point when more stable.  Reflux esophagitis: Protonix BID  Depression, anxiety, chronic pain, insomnia: Palliative care has been consulted for symptom management. I appreciate their assistance. Continue klonopin, duragesic, lidoderm patch, zoloft.  Hypercholesterolemia: Continue pravachol as at home.  Hyperglycemia: Glucoses will be followed with SSI.  Urine retention: Continue flomax.  Anemia of critical illness, chronic disease, and iron deficiency: Hemoglobin relatively stable at about 7.5 for the last 3 days. Monitor. IV iron per pharmacy.  Transfuse for Hgb < 7 or significant bleeding.  Deconditioning: PT/OT  I have seen and examined this patient myself. I have spent 38 minutes in his evaluation and care.  DVT prophylaxis: lovenox Code Status: full code Family Communication: Wife at bedside Disposition: progressive care Status is: Inpatient  Remains inpatient appropriate because:Inpatient level of care appropriate due to severity of illness  Dispo:  Patient From:    Planned Disposition: To be determined  Expected discharge date: 06/18/20  Medically stable for discharge:  The patient is not safe for discharge.   Cayman Kielbasa, DO [facility] Direct contact: see [email or web address]  7PM-7AM contact night coverage as above 06/11/2020, 2:53 PM  LOS: 45 days

## 2020-06-11 NOTE — Progress Notes (Signed)
PHARMACY - TOTAL PARENTERAL NUTRITION CONSULT NOTE  Indication:  Severe pancreatitis, biliary fistula, GOO  Patient Measurements: Height: _0  (175.3 cm) Weight: 87.6 kg (193 lb 2 oz) IBW/kg (Calculated) : 70.7 TPN AdjBW (KG): 86.1 Body mass index is 28.52 kg/m.  Assessment:  Adam Fowler admitted 04/26/20, post-ERCP 8/18 and biliary stent exchange for persistent postoperative bile leak related to recent cholecystectomy with abscess s/p percutaneous drainage. Patient developed acute pancreatitis, has had a prolonged hospitalization complicated by VDRF (extubated 8/24), AKI, encephalopathy, and afib. TPN initiated 9/11 for acute post-ERCP pancreatitis/ileus and persistent biliary leak. Significant gastric fluid retention from functional outlet obstruction. Patient acutely decompensated 9/23 resulted in pressors, non-rebreather and septic shock.   10/3: TF held midday on 10/2   Glucose / Insulin: A1c 5.6% - CBGs <180. Required 22 units rSSI + 15 units in TPN.  Electrolytes: K 4.1 (goal >/= 4 for ileus), Cl 112 high-normal. CO2 24. (max acetate in TPN), CoCa ~10.4 Renal: SCr < 1, BUN 36 LFTs / TGs: LFTs WNL except alk phos down to 200, tbili WNL, TG mildly elevated 180 Prealbumin / albumin: Prealbumin 9.2 (up). Albumin 1.6 Intake / Output; MIVF: UOP 0.5 mL/kg/hr, NG down 520m, drain 5019m  LBM 10/2.    GI Imaging:  9/5: CT - severe pancreatitis 9/7: HIDA - persistent bile leak with accumulations/paracolic gutter 9/9: CT - pancreatitis (peripancreatic fat stranding and peripancreatic fluid, percutaneous drain at the gallbladder fossa, unchanged biliary stent with no intrahepatic biliary duct dilatation, bilateral pleural effusions 9/15: KUB - diffuse colonic stool, no bowel obstruction/free air 9/18: CT - mild colonic diverticulosis, pseudocyst involvement and less likely abscess 9/23: KUB - no evidence of bowel obstruction/ileus 9/23: CT - Gastric ileus, dilated/fluid-filled esophagus, small  volume abd ascites 9/26: KUB - Suspect ascites and diffuse body wall edema 10/1: CT - sequela of necrotic pancreatitis with extensive fluid collection, moderate ascites, mod b/l effusions, basilar ATX Surgeries / Procedures: 8/18: ERCP/biliary stent exchange 9/9: Unsuccessful ERCP 9/10: Paracentesis - 1.4L out 9/21: EGD- Esophagitis w/ active oozing and stigmata of recent bleeding, gastritis w/ hemorrhage, congested duodenal mucosa; NGT trauma  9/21: Paracentesis/biliary drain placement/cholangiogram/drain check- 3L removed 9/30: GB fossa drain occluded, exchanged and upsized to 28F  Central access: PICC placed 05/19/20 TPN start date: 05/19/20  Nutritional Goals (per RD rec on 05/18/20): kCal: 2300-2500, Protein: 130-145g, Fluid: >2.3L/day  Current Nutrition: TPN - goal rate is 10052mr, providing 144g AA, 336g CHO and 67g ILE for a total of 2390 kCal 9/29- Trickle feeds initiated, held on 10/2 Vital 1.5 off (goal rate of 34m39m, incr 10 ml/day max) -provides 2340 kcal, 105g AA, 84g ILE, 292g CHO at goal.  ProSource 45 ml TID (11 g protein, 40 kcal per packet) - 3 given on 10/3  Plan:  Continue TPN at goal rate of 100mL24m providing 144g AA, 336g CHO and 67g ILE for a total of 2390 kCal, meeting 100% of needs  Electrolytes in TPN: Na 30mEq66mK 45mEq/86mag 4mEq/L,29mos 7mmol/L,19m removed, max acetate  Add standard MVI and trace elements to TPN  Continue resistant SSI Q4H + 15 units regular insulin in TPN  F/u restart of TF and ability to titrate  Thank you for involving pharmacy in this patient's care.  Cathy PieAlanda Slim FCCM [facility] Pharmacist Please see AMION for all Pharmacists' Contact Phone Numbers 06/11/2020, 7:32 AM   **Pharmacist phone directory can be found on [email or web address] under [facility]

## 2020-06-11 NOTE — Progress Notes (Signed)
Physical Therapy Treatment Patient Details Name: Adam Fowler MRN: [id number] DOB: 16-Feb-1956 Today's Date: 06/11/2020    History of Present Illness Pt is a 64 yo male who presents to the ED with severe abdominal pain, nausea and vomiting. Found to have post ERCP acute pancreatits. Pt developed Hypoxic resp failure and was Intubated 8/22  and extubated 8/24. Pt also with new onset a fib and acute ileus. PMH includes HTN and ERCP.  Pt with multiple drain placements during admission and having difficulty with pain management.    PT Comments    Patient not progressing due to limited activity tolerance with elevated HR in standing and requesting to return to supine despite eager to try and walk to bathroom to toilet.  He seems to be in pain throughout despite was resting quietly when I arrived.  Did participate in extremity ROM, though needing max cues for increased participation.  Agree with SNF level rehab at d/c.  PT to continue to follow.    Follow Up Recommendations  SNF     Equipment Recommendations  Wheelchair (measurements PT);Wheelchair cushion (measurements PT);Hospital bed    Recommendations for Other Services       Precautions / Restrictions Precautions Precautions: Fall;Other (comment) Precaution Comments: abdominal drain x 2, NG, cortrak    Mobility  Bed Mobility Overal bed mobility: Needs Assistance Bed Mobility: Supine to Sit     Supine to sit: Mod assist Sit to supine: Mod assist   General bed mobility comments: HOB up, assist for guiding legs off bed and to lift trunk  Transfers Overall transfer level: Needs assistance Equipment used: Rolling walker (2 wheeled) Transfers: Sit to/from Stand Sit to Stand: Mod assist         General transfer comment: lifting assist to stand, HR elevated to 130 and pt fatigued quickly requesting to return to supine though had been eager to walk to bathroom  Ambulation/Gait                 Stairs              Wheelchair Mobility    Modified Rankin (Stroke Patients Only)       Balance Overall balance assessment: Needs assistance Sitting-balance support: Bilateral upper extremity supported Sitting balance-Leahy Scale: Poor Sitting balance - Comments: heavy UE support at EOB   Standing balance support: Bilateral upper extremity supported Standing balance-Leahy Scale: Poor Standing balance comment: min-modA for balance with UE support                            Cognition Arousal/Alertness: Lethargic Behavior During Therapy: Anxious Overall Cognitive Status: Impaired/Different from baseline Area of Impairment: Attention;Memory;Following commands;Safety/judgement;Awareness;Problem solving                   Current Attention Level: Focused Memory: Decreased recall of precautions;Decreased short-term memory Following Commands: Follows one step commands with increased time Safety/Judgement: Decreased awareness of safety;Decreased awareness of deficits   Problem Solving: Slow processing;Requires verbal cues;Requires tactile cues General Comments: needed continuous cues to participate in therex in the bed, eager to walk for toileting even though had a condom catheter      Exercises General Exercises - Upper Extremity Shoulder Flexion: AAROM;Both;Other reps (comment) (3 reps max cues to complete) General Exercises - Lower Extremity Heel Slides: AROM;AAROM;Both;Other reps (comment) (3 reps with max cues for completion/participation)    General Comments        Pertinent Vitals/Pain Pain Assessment:  Faces Faces Pain Scale: Hurts whole lot Pain Location: moaning with movements Pain Descriptors / Indicators: Moaning Pain Intervention(s): Monitored during session;Limited activity within patient's tolerance;Repositioned    Home Living                      Prior Function            PT Goals (current goals can now be found in the care plan section)  Progress towards PT goals: Not progressing toward goals - comment    Frequency    Min 3X/week      PT Plan Current plan remains appropriate    Co-evaluation              AM-PAC PT "6 Clicks" Mobility   Outcome Measure  Help needed turning from your back to your side while in a flat bed without using bedrails?: A Little Help needed moving from lying on your back to sitting on the side of a flat bed without using bedrails?: A Little Help needed moving to and from a bed to a chair (including a wheelchair)?: Total Help needed standing up from a chair using your arms (e.g., wheelchair or bedside chair)?: A Lot Help needed to walk in hospital room?: Total Help needed climbing 3-5 steps with a railing? : Total 6 Click Score: 11    End of Session Equipment Utilized During Treatment: Oxygen Activity Tolerance: Patient limited by pain;Treatment limited secondary to medical complications (Comment) (HR elevation) Patient left: in bed;with call bell/phone within reach;with nursing/sitter in room Nurse Communication: Other (comment) (needs assist to scoot up and RN noted condom cath off) PT Visit Diagnosis: Unsteadiness on feet (R26.81);Muscle weakness (generalized) (M62.81);Difficulty in walking, not elsewhere classified (R26.2);Pain Pain - part of body:  (generalized)     Time: 3354-5625 PT Time Calculation (min) (ACUTE ONLY): 29 min  Charges:  $Therapeutic Activity: 23-37 mins                     Magda Kiel, PT Acute Rehabilitation Services [facility]:[phone] [phone] 06/11/2020    Reginia Naas 06/11/2020, 6:21 PM

## 2020-06-11 NOTE — Progress Notes (Signed)
Daily Rounding Note  06/11/2020, 11:52 AM  LOS: 45 days   SUBJECTIVE:   Chief complaint: Pancreatitis.  Persistent bile leak. Patient now transferred out of ICU back to floor as of this morning. Continues on TPN. 2 abdominal drains remain in place.  1 is draining the leaking bile, the other is positioned in the CBD.  Output from drain #2 was 350 cc yesterday.  Output from drain #1 was 75 cc yesterday. NGT as well as a core track feeding tube also in place. Recorded NG tube output yesterday was nil, was 750 the previous day.  Currently has about 100 mL in the canister.   OBJECTIVE:         Vital signs in last 24 hours:    Temp:  [97.9 F (36.6 C)-99.8 F (37.7 C)] 98 F (36.7 C) (10/04 1000) Pulse Rate:  [96-112] 99 (10/04 1000) Resp:  [12-31] 13 (10/04 1000) BP: (116-158)/(72-130) 126/84 (10/04 1000) SpO2:  [92 %-100 %] 100 % (10/04 1000) Last BM Date: 06/09/20 Filed Weights   06/04/20 0500 06/07/20 0427 06/08/20 0459  Weight: 88.9 kg 88.9 kg 87.6 kg   General: Essentially not responding to my exam.  I did not make vigorous attempts to get him to focus on me. Heart: RRR. Chest: No labored breathing, lungs clear Abdomen: Tense, distended, tender.  No bruising.  Bilious brown drainage in both bags. Extremities: Slight pedal edema. Neuro/Psych: Grimaced somewhat when I examined his belly but not responding to my calling out his name.  Spontaneous movement of his limbs.  Intake/Output from previous day: 10/03 0701 - 10/04 0700 In: 2828.4 [I.V.:2311.5; IV Piggyback:516.9] Out: 3428 [Urine:1300; Drains:425]  Intake/Output this shift: Total I/O In: 402.3 [I.V.:302.3; IV Piggyback:100] Out: 500 [Urine:500]  Lab Results: Recent Labs    06/09/20 0614 06/10/20 0500 06/11/20 0545  WBC 20.5* 18.1* 17.0*  HGB 7.5* 7.2* 7.6*  HCT 26.4* 25.9* 27.3*  PLT 306 302 307   BMET Recent Labs    06/09/20 0614  06/10/20 0500 06/11/20 0440  NA 143 142 143  K 4.1 3.9 4.1  CL 112* 111 112*  CO2 23 23 24   GLUCOSE 186* 165* 160*  BUN 35* 36* 36*  CREATININE 0.66 0.68 0.70  CALCIUM 8.7* 8.5* 8.5*   LFT Recent Labs    06/09/20 0614 06/11/20 0440  PROT 6.3* 6.4*  ALBUMIN 1.6* 1.6*  AST 23 28  ALT 28 35  ALKPHOS 232* 200*  BILITOT 0.7 1.0   PT/INR No results for input(s): LABPROT, INR in the last 72 hours. Hepatitis Panel No results for input(s): HEPBSAG, HCVAB, HEPAIGM, HEPBIGM in the last 72 hours.  Studies/Results: No results found.  ASSESMENT:   *   Severe, acute post ERCP pancreatitis occurring following replacement of plastic biliary stent with metal stent to addressed postcholecystectomy bile leak.  Possible organizing necrosis (not clearly infected)  and maturing/slightly diminished peripancreatic fluid collections per latest CTAP of 06/08/20 Continues on latest antibiotic Unasyn but has been on prolonged course of meropenem prior to that. Grew Klebsiella and Enterococcus from blood cultures of 9/23.    *    Post cholecystectomy bile leak, persisted despite upsizing of biliary stent with metal stent.  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  Percutaneous int/ext biliary drain for upsized to 12 [place] on 10/1.   Dr. Michaelle Birks, general surgery, is following.  *    Gastric outlet obstruction due to peripancreatic fluid collections. Nutrition via  TPN.  Routinely pulling out NG tube.  *    Altered mental status.  History of depression, anxiety, chronic pain.  On Klonopin, Duragesic, Lidoderm patch, Zoloft.  *     Anemia of critical illness, chronic disease and iron deficiency.  Received Feraheme on 10/3, 2nd dose planned.   PLAN   *   Continue supportive care. Clarified with nurses that meds should be administered via the NG tube and that this should be clamped for 1 hour following med administration.    Azucena Freed  06/11/2020, 11:52 AM Phone [phone]

## 2020-06-11 NOTE — Progress Notes (Signed)
Referring Physician(s): Dr. 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 He returned to IR 06/07/20 for a cholangiogram with routine exchange of percutaneous gallbladder fossa drain and internal/external biliary drain, with 12 French drains placed to gravity.   Subjective: Patient in bed, eyes closed, intermittent grimacing observed. Patient did not respond to my voice. He withdrew from pain when I pulled back the tape/dressings over his drains. No purposeful movement observed. Dressings to both drains are clean and dry.  CT abdomen/pelvis 06/08/20: IMPRESSION: 1. Sequela of necrotic pancreatitis with extensive fluid collection. The collections are unchanged in extent and have contracted with matured organization. 2. Moderate ascites. 3. Moderate bilateral pleural effusion, progressed on the right. 4. Bilateral lower lobe atelectasis.  Allergies: Toradol [ketorolac tromethamine]  Medications: Prior to Admission medications   Medication Sig Start Date End Date Taking? Authorizing Provider  Calcium Carb-Cholecalciferol (CALCIUM+D3 PO) Take 1 tablet by mouth daily.   Yes [provider]  Coenzyme Q10 (COQ10) 400 MG CAPS Take 800 mg by mouth in the morning.    Yes [provider]  Cyanocobalamin (VITAMIN B-12) 5000 MCG TBDP Take 5,000 mcg by mouth daily.   Yes [provider]  GLUCOSAMINE-CHONDROITIN PO Take 1 tablet by mouth in the morning and at bedtime.    Yes [provider]  Javier Docker Oil 1000 MG CAPS Take 1,000 mg by mouth in the morning and at bedtime.    Yes [provider]  lisinopril-hydrochlorothiazide (ZESTORETIC)  10-12.5 MG tablet Take 1 tablet by mouth daily. 03/04/20  Yes [provider]  Moringa Oleifera (MORINGA PO) Take 1,000 mg by mouth 2 (two) times daily.   Yes [provider]  Multiple Vitamin (MULTIVITAMIN WITH MINERALS) TABS tablet Take 1 tablet by mouth daily.   Yes [provider]  ondansetron (ZOFRAN) 4 MG tablet Take 1 tablet (4 mg total) by mouth every 8 (eight) hours as needed for nausea or vomiting. 04/25/20  Yes Mansouraty, Telford Nab., MD  OVER THE COUNTER MEDICATION Take 1 tablet by mouth See admin instructions. [facility] otc tablet- Take 1 tablet by mouth once a day   Yes [provider]  oxyCODONE (OXY IR/ROXICODONE) 5 MG immediate release tablet Take 5 mg by mouth 2 (two) times daily as needed (for pain).  04/25/20  Yes [provider]  pravastatin (PRAVACHOL) 80 MG tablet Take 40 mg by mouth in the morning and at bedtime.   Yes [provider]  Red Yeast Rice 600 MG CAPS Take 1,200 mg by mouth at bedtime.    Yes [provider]  sertraline (ZOLOFT) 100 MG tablet Take 100 mg by mouth daily. 11/08/19  Yes [provider]  TURMERIC PO Take 1,500 mg by mouth 2 (two) times daily.   Yes [provider]  zolpidem (AMBIEN) 5 MG tablet Take 5 mg by mouth at bedtime. 12/29/19  Yes [provider]  ibuprofen (ADVIL) 200 MG tablet Take 400-600 mg by mouth every 6 (six) hours as needed for headache or moderate pain.    [provider]     Vital Signs: BP 126/84 (BP Location: Left Arm)   Pulse 99   Temp 98 F (36.7 C) (Axillary)   Resp 14   Ht 5\' 9"  (  1.753 m)   Wt 193 lb 2 oz (87.6 kg)   SpO2 100%   BMI 28.52 kg/m   Physical Exam Constitutional:      Appearance: He is ill-appearing.     Comments: Eyes closed, occasional grimacing   Cardiovascular:     Rate and Rhythm: Regular rhythm. Tachycardia present.  Pulmonary:     Effort: Pulmonary effort is normal.  Abdominal:     General: There  is distension.     Comments: Less distention compared to prior visit. Less tenderness to palpation. RUQ drains in place. Both drains with approximately 100 ml of bile in gravity bag. Dressings are both clean and dry.  NG tube and Cortrak  Skin:    General: Skin is warm and dry.  Neurological:     Mental Status: He is disoriented.    Imaging: CT ABDOMEN PELVIS W CONTRAST  Result Date: 06/08/2020 CLINICAL DATA:  Acute, severe pancreatitis, evaluate fluid collections EXAM: CT ABDOMEN AND PELVIS WITH CONTRAST TECHNIQUE: Multidetector CT imaging of the abdomen and pelvis was performed using the standard protocol following bolus administration of intravenous contrast. CONTRAST:  115mL OMNIPAQUE IOHEXOL 300 MG/ML  SOLN COMPARISON:  05/31/2020 FINDINGS: Lower chest: Moderate bilateral pleural effusion, progressed on the right. Left more than right lower lobe collapse. Hepatobiliary: Right-sided percutaneous biliary drain traversing a CBD stent, tip at the distal duodenum. Cholecystostomy tube with collapsed gallbladder. Stable appearanceof the liver. Pancreas: Sequela of pancreatitis with organized fluid collection extending from the gastrosplenic ligament inferiorly and encircling the pancreas. There has been a diffuse decrease in size of the collection, although the extent is unchanged. For example, in the midline at the level of the gastric body the collection measures up to 3 cm in thickness as compared to 4.7 cm previously. Degree of fat stranding in the upper abdomen is unchanged. Vague high-density areas at the level of the pancreatic head presumably areas of previously administered contrast and areas of non necrotic parenchyma. Spleen: Unremarkable. Adrenals/Urinary Tract: Negative adrenals. No hydronephrosis or stone. Unremarkable bladder. Stomach/Bowel: No obstruction. There is percutaneous gastrostomy tube and post pyloric feeding tube which are in unremarkable position. The stomach is less distended  today with diffuse wall thickening from submucosal edema. Administered enteric contrast reaches the distal colon. Vascular/Lymphatic: IVC narrowing due to the retroperitoneal inflammation, unchanged. Narrowing of the main portal vein due to the inflammation, but patent and unchanged. Enlarged small bowel mesenteric lymph nodes and retroperitoneal lymph nodes, considered reactive. Reproductive:No pathologic findings. Other: Moderate ascites without focal loculation.  Body wall edema. Musculoskeletal: No acute abnormalities. IMPRESSION: 1. Sequela of necrotic pancreatitis with extensive fluid collection. The collections are unchanged in extent and have contracted with matured organization. 2. Moderate ascites. 3. Moderate bilateral pleural effusion, progressed on the right. 4. Bilateral lower lobe atelectasis. Electronically Signed   By: Monte Fantasia M.D.   On: 06/08/2020 05:03    Labs:  CBC: Recent Labs    06/07/20 0423 06/09/20 0614 06/10/20 0500 06/11/20 0545  WBC 21.4* 20.5* 18.1* 17.0*  HGB 8.5* 7.5* 7.2* 7.6*  HCT 29.4* 26.4* 25.9* 27.3*  PLT 286 306 302 307    COAGS: Recent Labs    02/02/20 2115 05/16/20 1905 05/29/20 0404 05/31/20 1415  INR 1.1 1.3* 1.4* 1.5*    BMP: Recent Labs    06/07/20 0423 06/09/20 0614 06/10/20 0500 06/11/20 0440  NA 142 143 142 143  K 4.4 4.1 3.9 4.1  CL 112* 112* 111 112*  CO2 21* 23 23  24  GLUCOSE 183* 186* 165* 160*  BUN 33* 35* 36* 36*  CALCIUM 8.7* 8.7* 8.5* 8.5*  CREATININE 0.72 0.66 0.68 0.70  GFRNONAA >60 >60 >60 >60  GFRAA >60 >60 >60 >60    LIVER FUNCTION TESTS: Recent Labs    06/06/20 0455 06/07/20 0423 06/09/20 0614 06/11/20 0440  BILITOT 0.9 1.0 0.7 1.0  AST 22 32 23 28  ALT 23 25 28  35  ALKPHOS 187* 268* 232* 200*  PROT 5.8* 6.1* 6.3* 6.4*  ALBUMIN 2.1* 2.0* 1.6* 1.6*    Assessment and Plan:  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 He returned to IR 06/07/20 for a cholangiogram with routine exchange of percutaneous gallbladder fossa drain and internal/external biliary drain, with 12 French drains placed to gravity.   Per Dr. Ayesha Rumpf (Surgery) 06/11/20 note: CT scan from 10/1 reviewed. Peripancreatic necrosis is walling off but there is no sign of infection and no drainable collection. PTBD is positioned appropriately within the duodenum. 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   Would not recommend any drainage of the peripancreatic fluid at this time. The bile leak is controlled, however the persistent leakage from the gallbladder remnant has not resolved even with a functioning PTBD. 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 The pigtail drain may be keeping the fistula open, so I will discuss exchange of this pigtail drain for a straight catheter under fluoro by IR, with position of the catheter further away from the remnant gallbladder. This will allow continued drainage of the bile leak but will hopefully allow the fistula to close. I spoke with the patient's wife at bedside today and reiterated that the patient still has months of recovery ahead of him, as is typical with severe acute pancreatitis. In light of SMV occlusion, we will have to be very cautious about enteral feed advancement and the patient will likely be TPN-dependent for weeks to months.  IR will await further consult from Surgery for possible drain exchange. Please continue flushing drains and documenting output each shift. 575 ml combined output documented in Epic with another 100 ml in each gravity bag. No sign of bile leak at this time. Other plans per primary teams. IR will continue to follow.  Electronically Signed: Soyla Dryer,  AGACNP-BC [phone] 06/11/2020, 5:04 PM   I spent a total of 15 Minutes at the the patient's bedside AND on the patient's hospital floor or unit, greater than 50% of which was counseling/coordinating care for RUQ drains.

## 2020-06-11 NOTE — Progress Notes (Signed)
    [facility] for [facility]ious [facility]   Reason for visit: Follow up on necrotizing pancreatitis  Interval History: no acute events; WBC stable, afebrile.  No new issues.    Physical Exam: Constitutional:  Vitals:   06/11/20 0900 06/11/20 1000  BP: (!) 148/93 126/84  Pulse: 96 99  Resp: 12 13  Temp:  98 F (36.7 C)  SpO2: 96% 100%   patient appears in NAD Respiratory: Normal respiratory effort; CTA B Cardiovascular: RRR GI: soft, nt, nd  Review of Systems: Unable to be assessed due to mental status  Lab Results  Component Value Date   WBC 17.0 (H) 06/11/2020   HGB 7.6 (L) 06/11/2020   HCT 27.3 (L) 06/11/2020   MCV 91.3 06/11/2020   PLT 307 06/11/2020    Lab Results  Component Value Date   CREATININE 0.70 06/11/2020   BUN 36 (H) 06/11/2020   NA 143 06/11/2020   K 4.1 06/11/2020   CL 112 (H) 06/11/2020   CO2 24 06/11/2020    Lab Results  Component Value Date   ALT 35 06/11/2020   AST 28 06/11/2020   ALKPHOS 200 (H) 06/11/2020     Microbiology: Recent Results (from the past 240 hour(s))  Culture, blood (single)     Status: None   Collection Time: 06/02/20  9:18 AM   Specimen: BLOOD LEFT ARM  Result Value Ref Range Status   Specimen Description BLOOD LEFT ARM  Final   Special Requests   Final    BOTTLES DRAWN AEROBIC AND ANAEROBIC Blood Culture adequate volume   Culture   Final    NO GROWTH 5 DAYS Performed at [facility], [address] [place], [place] [address]    Report Status 06/07/2020 FINAL  Final  Culture, blood (single)     Status: None   Collection Time: 06/02/20 10:26 AM   Specimen: BLOOD  Result Value Ref Range Status   Specimen Description BLOOD SITE NOT SPECIFIED  Final   Special Requests   Final    BOTTLES DRAWN AEROBIC AND ANAEROBIC Blood Culture adequate volume   Culture   Final    NO GROWTH 5 DAYS Performed at [facility], [address] [place], [place] [address]    Report Status 06/07/2020 FINAL  Final     Impression/Plan:  1. 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  Will continue to monitor.

## 2020-06-12 ENCOUNTER — Inpatient Hospital Stay (HOSPITAL_COMMUNITY): Payer: 59

## 2020-06-12 HISTORY — PX: IR CATHETER TUBE CHANGE: IMG717

## 2020-06-12 LAB — CBC WITH DIFFERENTIAL/PLATELET
Abs Immature Granulocytes: 0.19 10*3/uL — ABNORMAL HIGH (ref 0.00–0.07)
Basophils Absolute: 0.1 10*3/uL (ref 0.0–0.1)
Basophils Relative: 0 %
Eosinophils Absolute: 0.2 10*3/uL (ref 0.0–0.5)
Eosinophils Relative: 1 %
HCT: 26.7 % — ABNORMAL LOW (ref 39.0–52.0)
Hemoglobin: 7.5 g/dL — ABNORMAL LOW (ref 13.0–17.0)
Immature Granulocytes: 1 %
Lymphocytes Relative: 7 %
Lymphs Abs: 1.1 10*3/uL (ref 0.7–4.0)
MCH: 25 pg — ABNORMAL LOW (ref 26.0–34.0)
MCHC: 28.1 g/dL — ABNORMAL LOW (ref 30.0–36.0)
MCV: 89 fL (ref 80.0–100.0)
Monocytes Absolute: 1 10*3/uL (ref 0.1–1.0)
Monocytes Relative: 6 %
Neutro Abs: 12.6 10*3/uL — ABNORMAL HIGH (ref 1.7–7.7)
Neutrophils Relative %: 85 %
Platelets: 326 10*3/uL (ref 150–400)
RBC: 3 MIL/uL — ABNORMAL LOW (ref 4.22–5.81)
RDW: 16.4 % — ABNORMAL HIGH (ref 11.5–15.5)
WBC: 15.1 10*3/uL — ABNORMAL HIGH (ref 4.0–10.5)
nRBC: 0 % (ref 0.0–0.2)

## 2020-06-12 LAB — COMPREHENSIVE METABOLIC PANEL
ALT: 35 U/L (ref 0–44)
AST: 28 U/L (ref 15–41)
Albumin: 1.5 g/dL — ABNORMAL LOW (ref 3.5–5.0)
Alkaline Phosphatase: 185 U/L — ABNORMAL HIGH (ref 38–126)
Anion gap: 8 (ref 5–15)
BUN: 36 mg/dL — ABNORMAL HIGH (ref 8–23)
CO2: 25 mmol/L (ref 22–32)
Calcium: 8.5 mg/dL — ABNORMAL LOW (ref 8.9–10.3)
Chloride: 110 mmol/L (ref 98–111)
Creatinine, Ser: 0.76 mg/dL (ref 0.61–1.24)
GFR calc Af Amer: >60 mL/min (ref >60)
GFR calc non Af Amer: >60 mL/min (ref >60)
Glucose, Bld: 178 mg/dL — ABNORMAL HIGH (ref 70–99)
Potassium: 4.1 mmol/L (ref 3.5–5.1)
Sodium: 143 mmol/L (ref 135–145)
Total Bilirubin: 0.9 mg/dL (ref 0.3–1.2)
Total Protein: 6.3 g/dL — ABNORMAL LOW (ref 6.5–8.1)

## 2020-06-12 LAB — GLUCOSE, CAPILLARY
Glucose-Capillary: 123 mg/dL — ABNORMAL HIGH (ref 70–99)
Glucose-Capillary: 124 mg/dL — ABNORMAL HIGH (ref 70–99)
Glucose-Capillary: 133 mg/dL — ABNORMAL HIGH (ref 70–99)
Glucose-Capillary: 137 mg/dL — ABNORMAL HIGH (ref 70–99)
Glucose-Capillary: 142 mg/dL — ABNORMAL HIGH (ref 70–99)
Glucose-Capillary: 147 mg/dL — ABNORMAL HIGH (ref 70–99)

## 2020-06-12 MED ORDER — TRAVASOL 10 % IV SOLN
INTRAVENOUS | Status: AC
  Filled 2020-06-12: qty 1440

## 2020-06-12 MED ORDER — FENTANYL CITRATE (PF) 100 MCG/2ML IJ SOLN
INTRAMUSCULAR | Status: AC
Start: 2020-06-12 — End: 2020-06-13
  Filled 2020-06-12: qty 2

## 2020-06-12 MED ORDER — LIDOCAINE HCL (PF) 1 % IJ SOLN
INTRAMUSCULAR | Status: AC | PRN
  Administered 2020-06-12 (×2): 5 mL

## 2020-06-12 MED ORDER — LIDOCAINE HCL (PF) 1 % IJ SOLN
INTRAMUSCULAR | Status: AC
  Filled 2020-06-12: qty 30

## 2020-06-12 MED ORDER — LIDOCAINE-EPINEPHRINE 1 %-1:100000 IJ SOLN
INTRAMUSCULAR | Status: AC
  Filled 2020-06-12: qty 1

## 2020-06-12 MED ORDER — FENTANYL CITRATE (PF) 100 MCG/2ML IJ SOLN
INTRAMUSCULAR | Status: AC | PRN
  Administered 2020-06-12: 12.5 ug via INTRAVENOUS

## 2020-06-12 MED ORDER — MIDAZOLAM HCL 2 MG/2ML IJ SOLN
INTRAMUSCULAR | Status: AC | PRN
  Administered 2020-06-12: 0.5 mg via INTRAVENOUS

## 2020-06-12 MED ORDER — MIDAZOLAM HCL 2 MG/2ML IJ SOLN
INTRAMUSCULAR | Status: AC
  Filled 2020-06-12: qty 2

## 2020-06-12 NOTE — Progress Notes (Signed)
Nutrition Follow-up  DOCUMENTATION CODES:   Not applicable  INTERVENTION:   TPN per Pharmacy.  NUTRITION DIAGNOSIS:   Inadequate oral intake related to acute illness as evidenced by NPO status; ongoing  GOAL:   Patient will meet greater than or equal to 90% of their needs; met with TF  MONITOR:   Weight trends, Labs, I & O's, Other (Comment) (TPN)  REASON FOR ASSESSMENT:   Ventilator    ASSESSMENT:   64 year old male with PMH of HTN, ERCP on 8/18 s/p biliary stent exchange for persistent postoperative bile leak after recent cholecystectomy with abscess s/- percutaneous drainage. CT abdomen/pelvis showing acute pancreatitis. Pt admitted with severe sepsis secondary to post-ERCP pancreatitis.  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; NGT placed for decompression 9/10- s/p paracentesis- 1.4 L fluid removed 9/11- PICC placed, TPN initiated 9/14- NGT removed, advanced to clear liquid diet 9/15- KUB revealed diffuse colonic stool, no FA, no obstruction; advanced to full liquid diet 9/16- transitioned to cyclic TPN 3/35- NGT reinserted for decompression 9/21- s/p upper GI endoscopy- revealed LA grade C/ grade D esophagitis with active oozing and stigmata of recent bleeding (stasis esophagitis and NGT likely etiology), gastritis with hemorrhage, congested duodenal mucosa in D1/D2;  s/pUltrasound guided paracentesis (3 L removed), biloma drain check, percutaneous cholangiogram, placement of biliary drain via right bile ducts; NGT removed 9/27 concern for pill fragments in GB fossa drain 9/28 Blue food coloring placed in NG tube, not see in GB fossa drain 9/29 Cortrak ([place]) placed with initiation of trickle TF, NG replaced 10/1 CT - sequela of necrotic pancreatitis with extensive fluid collection, moderate ascites, mod b/l effusions, basilar ATX 10/2 TF stopped due to not tolerating and concern about reflux  Per surgery MD, cautious about enteral feeds as pt will likely be TPN dependent for weeks to months. NG output 1050 ml. Pt continues on TPN at goal rate of 100 ml/hr which provides 2390 kcal and 144 grams of protein.   Once able to restart tube feeding,  Initiate trickle TF Vital 1.5 at 20 ml/hr via [place] Cortrak Goal rate: Vital 1.5 of 65 ml/hr Pro-Source 45 mL TID Provides 138 g of protein, 2460 kcals and 1186 mL of free water  Labs and medications reviewed.   Diet Order:   Diet Order            Diet NPO time specified Except for: Ice Chips  Diet effective midnight                 EDUCATION NEEDS:   Education needs have been addressed  Skin:  Skin Assessment: Reviewed RN Assessment Skin Integrity Issues:: Incisions Incisions: closed abdomen  Last BM:  10/2  Height:   Ht Readings from Last 1 Encounters:  05/02/20 5' 9" (1.753 m)    Weight:   Wt Readings from Last 1 Encounters:  06/12/20 87.6 kg    Ideal Body Weight:  72.7 kg  BMI:  Body mass index is  28.52 kg/m.  Estimated Nutritional Needs:   Kcal:  1610-9604  Protein:  130-145 grams  Fluid:  > 2.3 L  Corrin Parker, MS, RD, LDN RD pager number/after hours weekend pager number on Amion.

## 2020-06-12 NOTE — Progress Notes (Signed)
Saw patient this AM and spoke with nursing staff, no significant change. WBC appears to be downtrending. I have also discussed his case with my advanced endoscopy colleagues and surgery. Question at this time is if a drain exchange to position it further away from gallbladder remnant will provide benefit, surgery and IR to discuss this issue. No plans for additional endoscopy attempts right now in light of GOO. Continue supportive care. He is high risk for surgery, poor nutritional status receiving TPN. Very difficult situation. We will round on him every few days moving forward but continue to follow peripherally otherwise, please contact me with any questions in the interim.   [place] Cellar, MD [facility]

## 2020-06-12 NOTE — Procedures (Signed)
Pre procedural Dx: Biloma Post procedural Dx: Same  Successful fluoroscopic guided exchange, downsizing and repositioning of now 8 French all-purpose drainage catheter with end located within the more peripheral aspect of the well-defined biloma cavity. Drain injection demonstrates persistent communication between the biloma cavity and the cystic duct remnant.   EBL: None Complications: None immediate  PLAN:  - Do not flush the biloma drainage catheter.   - Recommend maintaining diligent records regarding daily catheter output.   Ronny Bacon, MD Pager #: [phone]

## 2020-06-12 NOTE — Sedation Documentation (Signed)
Patient sats 93% on 2 liter. Oxygen turned back down to pre-procedural levels. Patient is alert at this time. Awaiting transport

## 2020-06-12 NOTE — Progress Notes (Signed)
PROGRESS NOTE  Eluzer Howdeshell HCW:[id number] DOB: 12-Apr-1956 DOA: 04/26/2020 PCP: Melony Overly, MD  Brief History   The patient is a 64 yr old man with a past medical history significant for HTN, insomnia, and renal lithiasis. Marland Kitchen He had undergone a cholecystectomy with placement of JP drain on 01/24/2020. On 02/02/2020 he was found to have a fluid collection in the the gallbladder fossa. On 6/7 this collection had developed bubbles and a second collection has formed along the right edge of the liver. At this time previous drains were removed and new drain was placed by IR. CBD stent was placed via ERCP on 03/21/2020. He next presented on 04/25/2020 for repeat ERCP after biliary leak following recent cholecystectomy with abscess after percutaneous drainage. The patient had complained of severe abdominal pain on this presentation. He was admitted with post ERCP pancreatitis. He decompensated 8/21/-24 with respiratory failure due to bilateral pleural effusions, atrial fibrillation with RVR. He required mechanical ventilation for volume overload and aspiration pneumonia. Another ERCP was attempted on 05/17/2020. This was not successful due to GOO. The patient was started on TPN on 9/10. On 05/31/2020 the patient had a decompensation with abdominal pain and hypotension, and he was transferred back to the ICU. Despite TPN and ongoing therapies the patient has become quite weak and debilited.  The patient was transferred out of the ICU to the care of the hospitalists service on 06/11/2020 by PCCM.  The patient continues to require TPN and will require it for months to come per surgery and gastroenterology. 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 The drain is within the duodenum appropriately and is functioning, although he continues to have persistent leakage. There is a months old well-defined tract now. Dr Zenia Resides discussed with IR  whether repositioning the drain further down the GI tract would allow the fistula to close. Dr. Pascal Lux did not feel that the patient would benefit from any further intervention at this time. Gastroenterology does not have any further plans for additional endoscopy now in light of the GOO. The SMV is nearly occluded. Supportive care is recommended. Consultants  . Gastroenterology . PCCM . Palliative care  Significant events  8/20 admit 8/21-24 admitted 9/21 perc bilary drainage  9/23 drain capped. Acute decompensation in afternoon. Fluid resuscitation, starting on pressors and transferring to ICU  9/27 started fentanyl infusion, improvement in pain control; concern for pill fragments in GB fossa drain 9/28 placed blue food coloring in NG tube, not seen in GB fossa drain 9/29 started trophic tube feedings 9/30 injection of GB drains 10/2 tube feeds held due to concern about reflux 10/3 transfer to progressive care  Antibiotics   Anti-infectives (From admission, onward)   Start     Dose/Rate Route Frequency Ordered Stop   06/04/20 1400  Ampicillin-Sulbactam (UNASYN) 3 g in sodium chloride 0.9 % 100 mL IVPB        3 g 200 mL/hr over 30 Minutes Intravenous Every 6 hours 06/04/20 0906     06/02/20 0300  meropenem (MERREM) 2 g in sodium chloride 0.9 % 100 mL IVPB  Status:  Discontinued        2 g 200 mL/hr over 30 Minutes Intravenous Every 8 hours 06/02/20 0248 06/04/20 0906   06/02/20 0300  vancomycin (VANCOCIN) IVPB 1000 mg/200 mL premix  Status:  Discontinued        1,000 mg 200 mL/hr over 60 Minutes Intravenous Every  12 hours 06/02/20 0248 06/04/20 0906   06/01/20 1000  piperacillin-tazobactam (ZOSYN) IVPB 3.375 g  Status:  Discontinued        3.375 g 12.5 mL/hr over 240 Minutes Intravenous Every 8 hours 06/01/20 0952 06/02/20 0248   05/29/20 0800  piperacillin-tazobactam (ZOSYN) IVPB 3.375 g        3.375 g 12.5 mL/hr over 240 Minutes Intravenous To Radiology 05/28/20 1328 05/29/20  1943   05/04/20 1230  meropenem (MERREM) 1 g in sodium chloride 0.9 % 100 mL IVPB  Status:  Discontinued        1 g 200 mL/hr over 30 Minutes Intravenous Every 8 hours 05/04/20 1156 05/19/20 0826   05/01/20 1400  piperacillin-tazobactam (ZOSYN) IVPB 3.375 g        3.375 g 12.5 mL/hr over 240 Minutes Intravenous Every 8 hours 05/01/20 1013 05/03/20 0907   04/29/20 2000  vancomycin (VANCOCIN) IVPB 1000 mg/200 mL premix  Status:  Discontinued        1,000 mg 200 mL/hr over 60 Minutes Intravenous Every 12 hours 04/29/20 0653 04/30/20 0831   04/29/20 0745  vancomycin (VANCOREADY) IVPB 1500 mg/300 mL        1,500 mg 150 mL/hr over 120 Minutes Intravenous  Once 04/29/20 0653 04/29/20 1129   04/26/20 2300  piperacillin-tazobactam (ZOSYN) IVPB 3.375 g        3.375 g 12.5 mL/hr over 240 Minutes Intravenous Every 8 hours 04/26/20 1716 05/01/20 0559   04/26/20 1700  piperacillin-tazobactam (ZOSYN) IVPB 3.375 g        3.375 g 100 mL/hr over 30 Minutes Intravenous  Once 04/26/20 1655 04/26/20 1844     Subjective  The patient is lying with his eyes closed. He will open eyes to voice.   Objective   Vitals:  Vitals:   06/12/20 0733 06/12/20 1259  BP: 126/85 132/77  Pulse: 99 (!) 104  Resp: 15 18  Temp: 98.7 F (37.1 C) 97.7 F (36.5 C)  SpO2:  98%    Exam:  Constitutional:  The patient is lying with his eyes closed. He will open eyes to voice. No acute distress. Respiratory:  . No increased work of breathing. . No wheezes, rales, or rhonchi . No tactile fremitus Cardiovascular:  . Regular rate and rhythm . No murmurs, ectopy, or gallups. . No lateral PMI. No thrills. Abdomen:  . Abdomen is soft, non-tender, somewhat distended . No hernias, masses, or organomegaly . Normoactive bowel sounds.  . Bilious fluid draining from drain. Musculoskeletal:  . No cyanosis or clubbing . 1-2+ pitting edema of lower extremities bilaterally. Skin:  . No rashes, lesions, ulcers . palpation  of skin: no induration or nodules Neurologic:  . CN 2-12 intact . Sensation all 4 extremities intact Psychiatric:  . Mental status o Mood, affect appropriate o Orientation to person, place, time  . judgment and insight appear intact  I have personally reviewed the following:   Today's Data  . Vitals, CMP, CBC  Micro Data  . Blood cultures x 2: No growth  Imaging  CT abdomen and pelvis: (06/08/2020) : 1. Sequela of necrotic pancreatitis with extensive fluid collection. The collections are unchanged in extent and have contracted with matured organization. 2. Moderate ascites. 3. Moderate bilateral pleural effusion, progressed on the right. 4. Bilateral lower lobe atelectasis.  Scheduled Meds: . Chlorhexidine Gluconate Cloth  6 each Topical Daily  . clonazePAM  0.25 mg Per Tube BID  . enoxaparin (LOVENOX) injection  40 mg Subcutaneous Q24H  .  feeding supplement (PROSource TF)  45 mL Per Tube TID  . fentaNYL  1 patch Transdermal Q72H  . influenza vac split quadrivalent PF  0.5 mL Intramuscular Tomorrow-1000  . insulin aspart  0-20 Units Subcutaneous Q4H  . lidocaine  1 patch Transdermal Q24H  . lidocaine-EPINEPHrine      . mouth rinse  15 mL Mouth Rinse BID  . pantoprazole sodium  40 mg Per Tube BID  . pravastatin  20 mg Per Tube q1800  . sertraline  100 mg Per Tube Daily  . sodium chloride flush  10-40 mL Intracatheter Q12H  . sodium chloride flush  5 mL Intracatheter Q8H  . sodium chloride flush  5 mL Intracatheter Q8H  . tamsulosin  0.4 mg Oral Daily   Continuous Infusions: . sodium chloride 250 mL (06/12/20 0730)  . ampicillin-sulbactam (UNASYN) IV 3 g (06/12/20 1322)  . ferumoxytol Stopped (06/10/20 1218)  . TPN ADULT (ION) 100 mL/hr at 06/11/20 1740  . TPN ADULT (ION)      Principal Problem:   Post-ERCP acute pancreatitis Active Problems:   Severe sepsis (HCC)   Polycythemia   AKI (acute kidney injury) ([place])   Metabolic acidosis   Acute respiratory failure  with hypoxia (HCC)   Bilateral pleural effusion   Ileus (HCC)   Bile duct leak   Gastric outlet obstruction   Abdominal distention   ARDS (adult respiratory distress syndrome) ([place])   Biloma following surgery   Acute blood loss anemia   Palliative care encounter   Chest pain   Acute cholecystitis   Encephalopathy acute   LOS: 46 days   A & P  Persistent bile leak after cholecystectomy on 01/24/20 with 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 Severe protein calorie malnutrition: Tube feeds held 10/02 due to concern about refluxing into NG tube. Continue TPN. Biliary drains per CCS, GI, IR. I appreciate their assistance.  Peritonitis with Enterococcal and Klebsiella bacteremia: IV Unasyn as per infectious disease. He will need TEE at some point when more stable.  Reflux esophagitis: Protonix BID  Depression, anxiety, chronic pain, insomnia: Palliative care has been consulted for symptom management. I appreciate their assistance. Continue klonopin, duragesic, lidoderm patch, zoloft.  Hypercholesterolemia: Continue pravachol as at home.  Hyperglycemia: Glucoses will be followed with SSI.  Urine retention: Continue flomax.  Anemia of critical illness, chronic disease, and iron deficiency: Hemoglobin relatively stable at about 7.5 for the last 3 days. Monitor. IV iron per pharmacy.  Transfuse for Hgb < 7 or significant bleeding.  Deconditioning: PT/OT  I have seen and examined this patient myself. I have spent 32 minutes in his evaluation and care.  DVT prophylaxis: lovenox Code Status: full code Family Communication: Wife at bedside Disposition: progressive care Status is: Inpatient  Remains inpatient appropriate because:Inpatient level of care appropriate due to severity of illness  Dispo:  Patient From:    Planned Disposition: To be determined  Expected discharge date:  06/18/20  Medically stable for discharge:  The patient is not safe for discharge.   Nakeia Calvi, DO [facility] Direct contact: see [email or web address]  7PM-7AM contact night coverage as above 06/12/2020, 3:40 PM  LOS: 45 days

## 2020-06-12 NOTE — Progress Notes (Signed)
PHARMACY - TOTAL PARENTERAL NUTRITION CONSULT NOTE  Indication:  Severe pancreatitis, biliary fistula, GOO  Patient Measurements: Height: 5' 9"  (175.3 cm) Weight: 87.6 kg (193 lb 2 oz) IBW/kg (Calculated) : 70.7 TPN AdjBW (KG): 86.1 Body mass index is 28.52 kg/m.  Assessment:  47 YOM admitted 04/26/20, post-ERCP 8/18 and biliary stent exchange for persistent postoperative bile leak related to recent cholecystectomy with abscess s/p percutaneous drainage. Patient developed acute pancreatitis, has had a prolonged hospitalization complicated by VDRF (extubated 8/24), AKI, encephalopathy, and afib. TPN initiated 9/11 for acute post-ERCP pancreatitis/ileus and persistent biliary leak. Significant gastric fluid retention from functional outlet obstruction. Patient acutely decompensated 9/23 resulted in pressors, non-rebreather and septic shock.   10/3: TF held midday on 10/2  10/4 Per surgery note - will have to be very cautious about enteral feed advancement and the patient will likely be TPN-dependent for weeks to months.   Glucose / Insulin: A1c 5.6% - CBGs <180. Required 19 units rSSI + 15 units in TPN.  Electrolytes: K 4.1 (goal >/= 4 for ileus), Cl 110. CO2 25. (max acetate in TPN) Renal: SCr < 1, BUN 36 LFTs / TGs: LFTs WNL except alk phos down to 200, tbili WNL, TG 112 Prealbumin / albumin: Prealbumin 9.2 (up). Albumin 1.6 Intake / Output; MIVF: UOP 1.1 mL/kg/hr, NG 1029m, drain 7055m  LBM 10/2.    GI Imaging:  9/5: CT - severe pancreatitis 9/7: HIDA - persistent bile leak with accumulations/paracolic gutter 9/9: CT - pancreatitis (peripancreatic fat stranding and peripancreatic fluid, percutaneous drain at the gallbladder fossa, unchanged biliary stent with no intrahepatic biliary duct dilatation, bilateral pleural effusions 9/15: KUB - diffuse colonic stool, no bowel obstruction/free air 9/18: CT - mild colonic diverticulosis, pseudocyst involvement and less likely abscess 9/23:  KUB - no evidence of bowel obstruction/ileus 9/23: CT - Gastric ileus, dilated/fluid-filled esophagus, small volume abd ascites 9/26: KUB - Suspect ascites and diffuse body wall edema 10/1: CT - sequela of necrotic pancreatitis with extensive fluid collection, moderate ascites, mod b/l effusions, basilar ATX Surgeries / Procedures: 8/18: ERCP/biliary stent exchange 9/9: Unsuccessful ERCP 9/10: Paracentesis - 1.4L out 9/21: EGD- Esophagitis w/ active oozing and stigmata of recent bleeding, gastritis w/ hemorrhage, congested duodenal mucosa; NGT trauma  9/21: Paracentesis/biliary drain placement/cholangiogram/drain check- 3L removed 9/30: GB fossa drain occluded, exchanged and upsized to 52F  Central access: PICC placed 05/19/20 TPN start date: 05/19/20  Nutritional Goals (per RD rec on 05/18/20): kCal: 2300-2500, Protein: 130-145g, Fluid: >2.3L/day  Current Nutrition: TPN - goal rate is 10039mr, providing 144g AA, 336g CHO and 67g ILE for a total of 2390 kCal 9/29- Trickle feeds initiated, held on 10/2 Vital 1.5 off (goal rate of 10m80m, incr 10 ml/day max) -provides 2340 kcal, 105g AA, 84g ILE, 292g CHO at goal.  ProSource 45 ml TID (11 g protein, 40 kcal per packet) - 3 given on 10/3  Plan:  Continue TPN at goal rate of 100mL27m providing 144g AA, 336g CHO and 67g ILE for a total of 2390 kCal, meeting 100% of needs  Electrolytes in TPN: Na 30mEq70mK 45mEq/46mag 4mEq/L,109mos 7mmol/L,3m removed, max acetate  Add standard MVI and trace elements to TPN  Continue resistant SSI Q4H + 15 units regular insulin in TPN  F/u restart of TF and ability to titrate per surgery will likely need TPN for weeks to months  Thank you for involving pharmacy in this patient's care.  Cathy PieAlanda Slim FCCM [facility] Pharmacist Please see AMIONShea Evans  for all Pharmacists' Contact Phone Numbers 06/12/2020, 7:16 AM   **Pharmacist phone directory can be found on [email or web address] listed under [place]**

## 2020-06-12 NOTE — Progress Notes (Signed)
Spoke with GI and Internal Medicine and expressed my concerns regarding the change in the NG out put. It has increased and looks purulent with small red clots. Abdominal pain and distension is not progressing. I asked for palliative care and code status to be discussed with the family.

## 2020-06-13 ENCOUNTER — Inpatient Hospital Stay (HOSPITAL_COMMUNITY): Payer: 59

## 2020-06-13 LAB — GLUCOSE, CAPILLARY
Glucose-Capillary: 140 mg/dL — ABNORMAL HIGH (ref 70–99)
Glucose-Capillary: 142 mg/dL — ABNORMAL HIGH (ref 70–99)
Glucose-Capillary: 141 mg/dL — ABNORMAL HIGH (ref 70–99)
Glucose-Capillary: 127 mg/dL — ABNORMAL HIGH (ref 70–99)
Glucose-Capillary: 158 mg/dL — ABNORMAL HIGH (ref 70–99)
Glucose-Capillary: 133 mg/dL — ABNORMAL HIGH (ref 70–99)
Glucose-Capillary: 133 mg/dL — ABNORMAL HIGH (ref 70–99)

## 2020-06-13 LAB — CBC WITH DIFFERENTIAL/PLATELET
Abs Immature Granulocytes: 0.17 10*3/uL — ABNORMAL HIGH (ref 0.00–0.07)
Basophils Absolute: 0.1 10*3/uL (ref 0.0–0.1)
Basophils Relative: 1 %
Eosinophils Absolute: 0.2 10*3/uL (ref 0.0–0.5)
Eosinophils Relative: 1 %
HCT: 27.5 % — ABNORMAL LOW (ref 39.0–52.0)
Hemoglobin: 7.7 g/dL — ABNORMAL LOW (ref 13.0–17.0)
Immature Granulocytes: 1 %
Lymphocytes Relative: 7 %
Lymphs Abs: 1.1 10*3/uL (ref 0.7–4.0)
MCH: 24.9 pg — ABNORMAL LOW (ref 26.0–34.0)
MCHC: 28 g/dL — ABNORMAL LOW (ref 30.0–36.0)
MCV: 89 fL (ref 80.0–100.0)
Monocytes Absolute: 1.2 10*3/uL — ABNORMAL HIGH (ref 0.1–1.0)
Monocytes Relative: 8 %
Neutro Abs: 12.6 10*3/uL — ABNORMAL HIGH (ref 1.7–7.7)
Neutrophils Relative %: 82 %
Platelets: 326 10*3/uL (ref 150–400)
RBC: 3.09 MIL/uL — ABNORMAL LOW (ref 4.22–5.81)
RDW: 16.5 % — ABNORMAL HIGH (ref 11.5–15.5)
WBC: 15.5 10*3/uL — ABNORMAL HIGH (ref 4.0–10.5)
nRBC: 0.1 % (ref 0.0–0.2)

## 2020-06-13 LAB — COMPREHENSIVE METABOLIC PANEL
ALT: 41 U/L (ref 0–44)
AST: 37 U/L (ref 15–41)
Albumin: 1.5 g/dL — ABNORMAL LOW (ref 3.5–5.0)
Alkaline Phosphatase: 162 U/L — ABNORMAL HIGH (ref 38–126)
Anion gap: 9 (ref 5–15)
BUN: 33 mg/dL — ABNORMAL HIGH (ref 8–23)
CO2: 25 mmol/L (ref 22–32)
Calcium: 8.5 mg/dL — ABNORMAL LOW (ref 8.9–10.3)
Chloride: 110 mmol/L (ref 98–111)
Creatinine, Ser: 0.77 mg/dL (ref 0.61–1.24)
GFR calc non Af Amer: >60 mL/min (ref >60)
Glucose, Bld: 162 mg/dL — ABNORMAL HIGH (ref 70–99)
Potassium: 4 mmol/L (ref 3.5–5.1)
Sodium: 144 mmol/L (ref 135–145)
Total Bilirubin: 0.9 mg/dL (ref 0.3–1.2)
Total Protein: 6.4 g/dL — ABNORMAL LOW (ref 6.5–8.1)

## 2020-06-13 MED ORDER — TRAVASOL 10 % IV SOLN
INTRAVENOUS | Status: AC
  Filled 2020-06-13: qty 1440

## 2020-06-13 MED ORDER — LACTATED RINGERS IV BOLUS
1000.0000 mL | Freq: Once | INTRAVENOUS | Status: AC
  Administered 2020-06-13: 1000 mL via INTRAVENOUS

## 2020-06-13 MED ORDER — ACETAMINOPHEN 325 MG PO TABS
650.0000 mg | ORAL_TABLET | Freq: Four times a day (QID) | ORAL | Status: DC | PRN
  Administered 2020-06-13 – 2020-06-14 (×2): 650 mg via ORAL
  Filled 2020-06-13 (×2): qty 2

## 2020-06-13 NOTE — Progress Notes (Signed)
PHARMACY - TOTAL PARENTERAL NUTRITION CONSULT NOTE  Indication:  Severe pancreatitis, biliary fistula, GOO  Patient Measurements: Height: 5' 9"  (175.3 cm) Weight: 87.6 kg (193 lb 2 oz) IBW/kg (Calculated) : 70.7 TPN AdjBW (KG): 86.1 Body mass index is 28.52 kg/m.  Assessment:  78 YOM admitted 04/26/20, post-ERCP 8/18 and biliary stent exchange for persistent postoperative bile leak related to recent cholecystectomy with abscess s/p percutaneous drainage. Patient developed acute pancreatitis, has had a prolonged hospitalization complicated by VDRF (extubated 8/24), AKI, encephalopathy, and afib. TPN initiated 9/11 for acute post-ERCP pancreatitis/ileus and persistent biliary leak. Significant gastric fluid retention from functional outlet obstruction. Patient acutely decompensated 9/23 resulted in pressors, non-rebreather and septic shock.   Per surgery note on 10/4 - will have to be very cautious about enteral feed advancement and the patient will likely be TPN-dependent for weeks to months. Considering re-positioning of the drain to better allow for fistula closure.    Glucose / Insulin: A1c 5.6% - CBGs <180. Required 18 units rSSI + 15 units in TPN, will increase insulin in TPN to reduce need for SSI Electrolytes: K 4 (goal >/= 4 for ileus), Cl 110. CO2 25. (max acetate in TPN), CoCa~10.5 (Ca removed from TPN since 10/2) Renal: SCr < 1, BUN 33 LFTs / TGs: LFTs WNL except alk phos down to 162, tbili WNL, TG 112 (10/4) Prealbumin / albumin: Prealbumin 9.2 (up). Albumin 1.5 Intake / Output; MIVF: UOP not charted yesterday, NG 573m, drain 3855m  LBM 10/2.    GI Imaging:  9/5: CT - severe pancreatitis 9/7: HIDA - persistent bile leak with accumulations/paracolic gutter 9/9: CT - pancreatitis (peripancreatic fat stranding and peripancreatic fluid, percutaneous drain at the gallbladder fossa, unchanged biliary stent with no intrahepatic biliary duct dilatation, bilateral pleural  effusions 9/15: KUB - diffuse colonic stool, no bowel obstruction/free air 9/18: CT - mild colonic diverticulosis, pseudocyst involvement and less likely abscess 9/23: KUB - no evidence of bowel obstruction/ileus 9/23: CT - Gastric ileus, dilated/fluid-filled esophagus, small volume abd ascites 9/26: KUB - Suspect ascites and diffuse body wall edema 10/1: CT - sequela of necrotic pancreatitis with extensive fluid collection, moderate ascites, mod b/l effusions, basilar ATX  Surgeries / Procedures: 8/18: ERCP/biliary stent exchange 9/9: Unsuccessful ERCP 9/10: Paracentesis - 1.4L out 9/21: EGD- Esophagitis w/ active oozing and stigmata of recent bleeding, gastritis w/ hemorrhage, congested duodenal mucosa; NGT trauma  9/21: Paracentesis/biliary drain placement/cholangiogram/drain check- 3L removed 9/30: GB fossa drain occluded, exchanged and upsized to 33F  Central access: PICC placed 05/19/20 TPN start date: 05/19/20  Nutritional Goals (per RD rec on 06/12/20): kCal: 2300-2500, Protein: 130-145g, Fluid: >2.3L/day  Current Nutrition: TPN - goal rate is 1009mr, providing 144g AA, 336g CHO and 67g ILE for a total of 2390 kCal  Plan:  Continue TPN at goal rate of 100m16m, providing 144g AA, 336g CHO and 67g ILE for a total of 2390 kCal, meeting 100% of needs  Electrolytes in TPN: Na 30mE60m K 45mEq2mMag 4mEq/L12mhos 7mmol/L36ma removed, max acetate  Add standard MVI and trace elements to TPN  Continue resistant SSI Q4H + increase to 25 units regular insulin in TPN  F/u restart of TF and ability to titrate per surgery will likely need TPN for weeks to months  Thank you for allowing pharmacy to be a part of this patient's care.  ElizabetAlycia Rossetti, BCPS Clinical Pharmacist Clinical phone for 06/13/2020: [phone] 7:22 AM   **Pharmacist phone directory can now be found on  [email or web address] ([email or web address]).  Listed under [place].

## 2020-06-13 NOTE — Progress Notes (Addendum)
Patient's wife called to verbalize concerns about her husband's care. She states that she doesn't believe [facility] is capable of taking care of her husband and that she is wanting to transfer him to [facility]. She says that she don't want to give up on her husband and that she believes that the drs have already given up and are not evaluating any other options towards his health. She says that she was at the bedside today and have been married to him for 46 yrs and she told him that he is now a 'do not resuscitate', per wife, patient says  that he rolled his eyes and says "kiss my grits". Wife says he is not ready to die. Wife further verbalizes her concern that she has her attorney notified about this situation and that she needs to talk with social services and to get a notary as well. She says she will be at his bedside in the morning at 9, 9:30ish and will not leave until about noon and she is hoping to speak with the attending about his transfer. Patient's wife says if she is not at bedside, PLEASE CALL HER.  This nurse attempted to reassure patient of her concerns being addressed in the morning. Patient verbalizes that she is appreciative and that she wants to talk with her husband now. During conversation, patient was extremely emotional and began to cry while speaking with this nurse. Will place note in physician's notes and pass on to the day shift charge nurse as well.

## 2020-06-13 NOTE — Progress Notes (Signed)
PROGRESS NOTE    Adam Fowler  YJE:[id number] DOB: [birth year] DOA: 04/26/2020 PCP: Melony Overly, MD    Brief Narrative: 64 yr old man with a past medical history significant for HTN, insomnia, and renal lithiasis. Marland Kitchen He had undergone a cholecystectomy with placement of JP drain on 01/24/2020. On 02/02/2020 he was found to have a fluid collection in the the gallbladder fossa. On 6/7 this collection had developed bubbles and a second collection has formed along the right edge of the liver. At this time previous drains were removed and new drain was placed by IR. CBD stent was placed via ERCP on 03/21/2020. He next presented on 04/25/2020 for repeat ERCP after biliary leak following recent cholecystectomy with abscess after percutaneous drainage. The patient had complained of severe abdominal pain on this presentation. He was admitted with post ERCP pancreatitis. He decompensated 8/21/-24 with respiratory failure due to bilateral pleural effusions, atrial fibrillation with RVR. He required mechanical ventilation for volume overload and aspiration pneumonia. Another ERCP was attempted on 05/17/2020. This was not successful due to GOO. The patient was started on TPN on 9/10. On 05/31/2020 the patient had a decompensation with abdominal pain and hypotension, and he was transferred back to the ICU. Despite TPN and ongoing therapies the patient has become quite weak and debilited.  The patient was transferred out of the ICU to the care of the hospitalists service on 06/11/2020 by PCCM. The patient continues to require TPN and will require it for months to come per surgery and gastroenterology. 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 The drain is within the duodenum appropriately and is functioning, although he continues to have persistent leakage. There is a months old well-defined tract now. Dr Zenia Resides discussed with IR whether  repositioning the drain further down the GI tract would allow the fistula to close. Dr. Pascal Lux did not feel that the patient would benefit from any further intervention at this time. Gastroenterology does not have any further plans for additional endoscopy now in light of the GOO. The SMV is nearly occluded. Supportive care is recommended.  8/20 admit 8/21-24 admitted 9/21 perc bilary drainage  9/23 drain capped. Acute decompensation in afternoon. Fluid resuscitation, starting on pressors and transferring to ICU  9/27 started fentanyl infusion, improvement in pain control; concern for pill fragments in GB fossa drain 9/28 placed blue food coloring in NG tube, not seen in GB fossa drain 9/29 started trophic tube feedings 9/30 injection of GB drains 10/2 tube feeds held due to concern about reflux 10/3 transfer to progressive care Assessment & Plan:   Principal Problem:   Post-ERCP acute pancreatitis Active Problems:   Severe sepsis ([place])   Polycythemia   AKI (acute kidney injury) ([place])   Metabolic acidosis   Acute respiratory failure with hypoxia ([facility])   Bilateral pleural effusion   Ileus ([facility])   Bile duct leak   Gastric outlet obstruction   Abdominal distention   ARDS (adult respiratory distress syndrome) ([place])   Biloma following surgery   Acute blood loss anemia   Palliative care encounter   Chest pain   Acute cholecystitis   Encephalopathy acute    Persistent bile leak after cholecystectomy on 01/24/20 with 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 Severe protein calorie malnutrition: Tube feeds held 10/02 due to concern about refluxing into NG tube. Continue TPN. Biliary drains per CCS,  GI, IR S/P downsizing and repositioning of drainage catheter with and located within the more peripheral aspect of the well defined biloma cavity.  Drain injection demonstrates persistent communication between  the cavity and the cystic duct remnant.  Peritonitis with Enterococcal and Klebsiella bacteremia: IV Unasyn as per infectious disease. He will need TEE at some point when more stable.  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  Hypercholesterolemia: Continue pravachol as at home.  Hyperglycemia: Glucoses will be followed with SSI.  Urine retention: Continue flomax.  Anemia of critical illness,chronic disease, and iron deficiency: Hemoglobin relatively stable at about 7.5 for the last 3 days. Monitor. IV iron per pharmacy.  Transfuse for Hgb < 7 or significant bleeding.  Deconditioning: PT/OT  Goals of care discussed with patient's wife in the room.  She was very tearful and concerned that patient has not gotten any better for the last 48 days he has been in the hospital.  She only see his health deteriorating day by day.  Discussed in detail about CODE STATUS.  She has a friend who works with [employer] [facility]/[place] who she has been talking to and agrees for DO NOT RESUSCITATE at this time.  I did explain to her that patient's prognosis is very poor.      Nutrition Problem: Inadequate oral intake Etiology: acute illness     Signs/Symptoms: NPO status    Interventions: TPN  Estimated body mass index is 28.52 kg/m as calculated from the following:   Height as of this encounter: 5\' 9"  (1.753 m).   Weight as of this encounter: 87.6 kg.  DVT prophylaxis: Lovenox Code Status: DNR Family Communication discussed with patient's wife in the room  disposition Plan:  Status is: Inpatient  Dispo:  Patient From:  Home  Planned Disposition: To be determined  Expected discharge date: 06/18/20  Medically stable for discharge:  No   Consultants:    PCCM, GI, interventional radiology palliative care  Antimicrobials:  Anti-infectives (From admission, onward)   Start     Dose/Rate Route Frequency Ordered Stop   06/04/20 1400  Ampicillin-Sulbactam (UNASYN) 3 g in sodium chloride 0.9 % 100 mL IVPB        3 g 200 mL/hr over 30 Minutes Intravenous Every 6 hours 06/04/20 0906     06/02/20 0300  meropenem (MERREM) 2 g in sodium chloride 0.9 % 100 mL IVPB  Status:  Discontinued        2 g 200 mL/hr over 30 Minutes Intravenous Every 8 hours 06/02/20 0248 06/04/20 0906   06/02/20 0300  vancomycin (VANCOCIN) IVPB 1000 mg/200 mL premix  Status:  Discontinued        1,000 mg 200 mL/hr over 60 Minutes Intravenous Every 12 hours 06/02/20 0248 06/04/20 0906   06/01/20 1000  piperacillin-tazobactam (ZOSYN) IVPB 3.375 g  Status:  Discontinued        3.375 g 12.5 mL/hr over 240 Minutes Intravenous Every 8 hours 06/01/20 0952 06/02/20 0248   05/29/20 0800  piperacillin-tazobactam (ZOSYN) IVPB 3.375 g        3.375 g 12.5 mL/hr over 240 Minutes Intravenous To Radiology 05/28/20 1328 05/29/20 1943   05/04/20 1230  meropenem (MERREM) 1 g in sodium chloride 0.9 % 100 mL IVPB  Status:  Discontinued        1 g 200 mL/hr over 30  Minutes Intravenous Every 8 hours 05/04/20 1156 05/19/20 0826   05/01/20 1400  piperacillin-tazobactam (ZOSYN) IVPB 3.375 g        3.375 g 12.5 mL/hr over 240 Minutes Intravenous Every 8 hours 05/01/20 1013 05/03/20 0907   04/29/20 2000  vancomycin (VANCOCIN) IVPB 1000 mg/200 mL premix  Status:  Discontinued        1,000 mg 200 mL/hr over 60 Minutes Intravenous Every 12 hours 04/29/20 0653 04/30/20 0831   04/29/20 0745  vancomycin (VANCOREADY) IVPB 1500 mg/300 mL        1,500 mg 150 mL/hr over 120 Minutes Intravenous  Once 04/29/20 0653 04/29/20 1129   04/26/20 2300  piperacillin-tazobactam (ZOSYN) IVPB 3.375 g        3.375 g 12.5 mL/hr over 240 Minutes Intravenous Every 8 hours 04/26/20 1716 05/01/20 0559   04/26/20 1700   piperacillin-tazobactam (ZOSYN) IVPB 3.375 g        3.375 g 100 mL/hr over 30 Minutes Intravenous  Once 04/26/20 1655 04/26/20 1844       Subjective: She is restless moaning and groaning asking for sips of water complains of shortness of breath wife by the bedside  Objective: Vitals:   06/13/20 0112 06/13/20 0423 06/13/20 0756 06/13/20 1133  BP: 132/85 123/80 133/81 134/85  Pulse: (!) 114 (!) 107 (!) 116 (!) 116  Resp: 19 14 20  (!) 22  Temp: 100 F (37.8 C) 99.5 F (37.5 C) 98.5 F (36.9 C) 99.5 F (37.5 C)  TempSrc: Oral Oral Oral Oral  SpO2: 94% 96% 96% 98%  Weight:      Height:        Intake/Output Summary (Last 24 hours) at 06/13/2020 1544 Last data filed at 06/13/2020 0905 Gross per 24 hour  Intake 1808.89 ml  Output 1130 ml  Net 678.89 ml   Filed Weights   06/07/20 0427 06/08/20 0459 06/12/20 0500  Weight: 88.9 kg 87.6 kg 87.6 kg    Examination:  General exam: Appears calm and comfortable  Respiratory system: Clear to auscultation. Respiratory effort normal. Cardiovascular system: S1 & S2 heard, RRR. No JVD, murmurs, rubs, gallops or clicks. No pedal edema. Gastrointestinal system: Abdomen is distended, soft and nontender. No organomegaly or masses felt.  Diminished bowel sounds heard. Central nervous system: Alert and oriented. No focal neurological deficits. Extremities: Symmetric 5 x 5 power. Skin: No rashes, lesions or ulcers Psychiatry: Judgement and insight appear normal. Mood & affect appropriate.     Data Reviewed: I have personally reviewed following labs and imaging studies  CBC: Recent Labs  Lab 06/07/20 0423 06/07/20 0423 06/09/20 0349 06/10/20 0500 06/11/20 0545 06/12/20 0347 06/13/20 0407  WBC 21.4*   < > 20.5* 18.1* 17.0* 15.1* 15.5*  NEUTROABS 18.2*  --   --   --  14.1* 12.6* 12.6*  HGB 8.5*   < > 7.5* 7.2* 7.6* 7.5* 7.7*  HCT 29.4*   < > 26.4* 25.9* 27.3* 26.7* 27.5*  MCV 91.3   < > 91.3 90.9 91.3 89.0 89.0  PLT 286   < > 306  302 307 326 326   < > = values in this interval not displayed.   Basic Metabolic Panel: Recent Labs  Lab 06/07/20 0423 06/07/20 0423 06/09/20 1791 06/10/20 0500 06/11/20 0440 06/12/20 0347 06/13/20 0407  NA 142   < > 143 142 143 143 144  K 4.4   < > 4.1 3.9 4.1 4.1 4.0  CL 112*   < > 112* 111 112* 110  110  CO2 21*   < > 23 23 24 25 25   GLUCOSE 183*   < > 186* 165* 160* 178* 162*  BUN 33*   < > 35* 36* 36* 36* 33*  CREATININE 0.72   < > 0.66 0.68 0.70 0.76 0.77  CALCIUM 8.7*   < > 8.7* 8.5* 8.5* 8.5* 8.5*  MG 2.2  --  2.3  --  2.2  --   --   PHOS 3.6  --   --   --  4.3  --   --    < > = values in this interval not displayed.   GFR: Estimated Creatinine Clearance: 102.3 mL/min (by C-G formula based on SCr of 0.77 mg/dL). Liver Function Tests: Recent Labs  Lab 06/07/20 0423 06/09/20 0614 06/11/20 0440 06/12/20 0347 06/13/20 0407  AST 32 23 28 28  37  ALT 25 28 35 35 41  ALKPHOS 268* 232* 200* 185* 162*  BILITOT 1.0 0.7 1.0 0.9 0.9  PROT 6.1* 6.3* 6.4* 6.3* 6.4*  ALBUMIN 2.0* 1.6* 1.6* 1.5* 1.5*   No results for input(s): LIPASE, AMYLASE in the last 168 hours. No results for input(s): AMMONIA in the last 168 hours. Coagulation Profile: No results for input(s): INR, PROTIME in the last 168 hours. Cardiac Enzymes: No results for input(s): CKTOTAL, CKMB, CKMBINDEX, TROPONINI in the last 168 hours. BNP (last 3 results) No results for input(s): PROBNP in the last 8760 hours. HbA1C: No results for input(s): HGBA1C in the last 72 hours. CBG: Recent Labs  Lab 06/12/20 2026 06/13/20 0134 06/13/20 0418 06/13/20 0754 06/13/20 1134  GLUCAP 123* 140* 142* 141* 127*   Lipid Profile: Recent Labs    06/11/20 0440  TRIG 112   Thyroid Function Tests: No results for input(s): TSH, T4TOTAL, FREET4, T3FREE, THYROIDAB in the last 72 hours. Anemia Panel: No results for input(s): VITAMINB12, FOLATE, FERRITIN, TIBC, IRON, RETICCTPCT in the last 72 hours. Sepsis Labs: No  results for input(s): PROCALCITON, LATICACIDVEN in the last 168 hours.  No results found for this or any previous visit (from the past 240 hour(s)).       Radiology Studies: IR Catheter Tube Change  Result Date: 06/12/2020 CLINICAL DATA:  History of cholecystitis complicated by postoperative leak from the cystic duct remnant requiring percutaneous drainage catheter placement on 02/13/2020. Patient subsequent underwent ERCP with internal biliary stent placement however this procedure was complicated by development of pancreatitis. Patient has since undergone internal/external right-sided biliary drainage catheter placement on 05/30/2020 with subsequent biliary drain exchange and up sizing on 06/07/2020 Patient now returns for biloma drainage catheter injection, repositioning and downsizing at the request of consulting hepatic biliary surgeon, Dr. Zenia Resides. EXAM: FLUOROSCOPIC GUIDED BILOMA DRAINAGE CATHETER EXCHANGE, DOWNSIZING AND REPOSITIONING COMPARISON:  Percutaneous biliary drainage catheter exchange and up sizing-06/07/2020; CT abdomen and pelvis-06/08/2020 CONTRAST:  10 cc Omnipaque 300 - administered via the percutaneous drainage catheter. MEDICATIONS: None. ANESTHESIA/SEDATION: Moderate (conscious) sedation was employed during this procedure. A total of Versed 0.5 mg and Fentanyl 25 mcg was administered intravenously. Moderate Sedation Time: 10 minutes. The patient's level of consciousness and vital signs were monitored continuously by radiology nursing throughout the procedure under my direct supervision. FLUOROSCOPY TIME:  42 seconds (10 mGy) TECHNIQUE: Patient was positioned supine on the fluoroscopy table. The external portion of the existing percutaneous drainage catheter as well as the surrounding skin was prepped and draped in usual sterile fashion. A preprocedural spot fluoroscopic image was obtained of the existing percutaneous drainage catheter. A  small amount of contrast was injected via  the existing percutaneous drainage catheter and several fluoroscopic images were obtained in various obliquities. The external portion of the percutaneous drainage catheter was cut and cannulated with a short Amplatz wire. Under intermittent fluoroscopic guidance, the existing percutaneous drainage catheter was exchanged for a new slightly smaller now 8 French percutaneous drainage catheter with end coiled and locked within the more peripheral aspect of the decompressed abscess cavity/biloma. Contrast injection confirmed appropriate position functionality of the percutaneous drainage catheter. The percutaneous drainage catheter was connected to a gravity bag and secured in place within interrupted suture and a StatLock device. A dressing was applied. The patient tolerated the procedure well without immediate postprocedural complication. FINDINGS: Preprocedural spot fluoroscopic image demonstrates unchanged positioning of the biloma drainage catheter with end coiled and locked overlying the expected location of the gallbladder fossa. An internal biliary stent overlies expected location of the CBD. An internal-external percutaneous biliary drainage catheter is unchanged in positioning with radiopaque marker located at the level of the central aspect of the right intrahepatic biliary tree. A feeding enteric tube tip is excluded from view, at least within the horizontal segment of the duodenum. There is an additional decompressive nasogastric tube with tip overlying the gastric fundus. Contrast injection of the biloma drainage catheter demonstrates brisk opacification of the decompressed abscess cavity/biloma to the cystic duct remnant which communicates with the common bile duct. After fluoroscopic guided exchange and downsizing, the slightly smaller 8 [place] all-purpose drainage catheter is positioned within the more lateral aspect of the biloma cavity. IMPRESSION: 1. Successful fluoroscopic guided exchange,  downsizing and repositioning of now 8 [place] all-purpose drainage catheter with end located within the more peripheral aspect of the well-defined biloma cavity. 2. Persistent communication between the biloma cavity and the cystic duct remnant. PLAN: - Do not flush the biloma drainage catheter. - Recommend maintaining diligent records regarding daily catheter output. Electronically Signed   By: Sandi Mariscal M.D.   On: 06/12/2020 16:16        Scheduled Meds: . Chlorhexidine Gluconate Cloth  6 each Topical Daily  . clonazePAM  0.25 mg Per Tube BID  . enoxaparin (LOVENOX) injection  40 mg Subcutaneous Q24H  . fentaNYL  1 patch Transdermal Q72H  . influenza vac split quadrivalent PF  0.5 mL Intramuscular Tomorrow-1000  . insulin aspart  0-20 Units Subcutaneous Q4H  . lidocaine  1 patch Transdermal Q24H  . mouth rinse  15 mL Mouth Rinse BID  . pantoprazole sodium  40 mg Per Tube BID  . pravastatin  20 mg Per Tube q1800  . sertraline  100 mg Per Tube Daily  . sodium chloride flush  10-40 mL Intracatheter Q12H  . sodium chloride flush  5 mL Intracatheter Q8H  . sodium chloride flush  5 mL Intracatheter Q8H  . tamsulosin  0.4 mg Oral Daily   Continuous Infusions: . sodium chloride 250 mL (06/12/20 0730)  . ampicillin-sulbactam (UNASYN) IV 3 g (06/13/20 0741)  . TPN ADULT (ION) 100 mL/hr at 06/12/20 1658  . TPN ADULT (ION)       LOS: 47 days     Georgette Shell, MD  06/13/2020, 3:44 PM

## 2020-06-13 NOTE — Progress Notes (Signed)
Occupational Therapy Treatment Patient Details Name: Adam Fowler MRN: [id number] DOB: February 12, 1956 Today's Date: 06/13/2020    History of present illness Pt is a 64 yo male who presents to the ED with severe abdominal pain, nausea and vomiting. Found to have post ERCP acute pancreatits. Pt developed Hypoxic resp failure and was Intubated 8/22  and extubated 8/24. Pt also with new onset a fib and acute ileus. PMH includes HTN and ERCP.  Pt with multiple drain placements during admission and having difficulty with pain management.   OT comments  Pt making slow progress towards OT goals. He continues to have limitations due to pain, poor cognition and endurance. Pt with notable increased in HR and respiratory status with minimal activity, often requiring seated rest breaks and cues for deep breathing. Pt requiring maxA for basic ADL at EOB today (grooming); tolerating x2 sit<>stand from EOB to RW with two person assist. Have updated d/c recommendations to SNF given pt slower progress. Will continue to follow while acutely admitted.   Follow Up Recommendations  SNF;Supervision/Assistance - 24 hour    Equipment Recommendations  Wheelchair (measurements OT);Wheelchair cushion (measurements OT);Hospital bed (TBD)          Precautions / Restrictions Precautions Precautions: Fall Precaution Comments: abdominal drain x 2, NG, cortrak Restrictions Weight Bearing Restrictions: No       Mobility Bed Mobility Overal bed mobility: Needs Assistance Bed Mobility: Rolling;Sidelying to Sit;Sit to Supine Rolling: Min assist Sidelying to sit: Mod assist;+2 for safety/equipment   Sit to supine: Mod assist;+2 for physical assistance   General bed mobility comments: assist for technique, pt using rail, assist for legs off EOB and for trunk upright, protecting tubes/lines; assist for legs, trunk to supine  Transfers Overall transfer level: Needs assistance Equipment used: Rolling walker (2  wheeled) Transfers: Sit to/from Stand Sit to Stand: Mod assist;+2 physical assistance         General transfer comment: stood x 2 with lifting help and encouragement, increased time; HR up to 133, SpO2 down to 86% on 3L O2 [place] RR up to 40's    Balance Overall balance assessment: Needs assistance Sitting-balance support: Feet supported;Bilateral upper extremity supported;Single extremity supported Sitting balance-Leahy Scale: Poor Sitting balance - Comments: reliant on UE support EOB   Standing balance support: Bilateral upper extremity supported Standing balance-Leahy Scale: Poor Standing balance comment: +2 assist for standing balance                            ADL either performed or assessed with clinical judgement   ADL Overall ADL's : Needs assistance/impaired Eating/Feeding: NPO   Grooming: Maximal assistance;Sitting Grooming Details (indicate cue type and reason): for oral care using suction/yonker, seated EOB; increased assist as pt impulsive around NG              Lower Body Dressing: Maximal assistance;+2 for physical assistance;+2 for safety/equipment;Sit to/from stand Lower Body Dressing Details (indicate cue type and reason): pt was able to don R sock at bed level using figure 4 with forward chainging method. Significant effort/dyspnea even with bed level activity so opted to assist with L sock for energy conservation to perform other aspects of mobility              Functional mobility during ADLs: Moderate assistance;+2 for physical assistance;+2 for safety/equipment;Rolling walker  Cognition Arousal/Alertness: Awake/alert Behavior During Therapy: Anxious Overall Cognitive Status: Impaired/Different from baseline Area of Impairment: Attention;Memory;Following commands;Safety/judgement;Awareness;Problem solving                   Current Attention Level: Focused Memory: Decreased recall of  precautions;Decreased short-term memory Following Commands: Follows one step commands with increased time Safety/Judgement: Decreased awareness of safety;Decreased awareness of deficits Awareness: Intellectual Problem Solving: Slow processing;Requires verbal cues;Requires tactile cues General Comments: requires continuous cues to perform simple instructions         Exercises     Shoulder Instructions       General Comments HR and RR elevated with minimal activity. HR up to 130s with activity and RR noted up to low 40s. SpO2 >/=88% on 3L     Pertinent Vitals/ Pain       Pain Assessment: Faces Faces Pain Scale: Hurts whole lot Pain Location: moaning with movements Pain Descriptors / Indicators: Moaning Pain Intervention(s): Monitored during session;Repositioned;Limited activity within patient's tolerance  Home Living                                          Prior Functioning/Environment              Frequency  Min 2X/week        Progress Toward Goals  OT Goals(current goals can now be found in the care plan section)  Progress towards OT goals: Progressing toward goals  Acute Rehab OT Goals Patient Stated Goal: to reduce pain OT Goal Formulation: With patient Time For Goal Achievement: 06/23/20 Potential to Achieve Goals: Fair ADL Goals Pt Will Perform Grooming: with min assist;standing Pt Will Perform Lower Body Bathing: with mod assist;with adaptive equipment;sit to/from stand Pt Will Perform Lower Body Dressing: with mod assist;with adaptive equipment;sit to/from stand Pt Will Transfer to Toilet: with min assist;ambulating;regular height toilet;bedside commode;grab bars Pt Will Perform Toileting - Clothing Manipulation and hygiene: with min assist;sit to/from stand Pt/caregiver will Perform Home Exercise Program: Increased strength;With theraband;With written HEP provided;With minimal assist Additional ADL Goal #1: Pt will sustain attention  to familiar ADL tasks x 4 mins with min cues   Plan Discharge plan needs to be updated    Co-evaluation    PT/OT/SLP Co-Evaluation/Treatment: Yes Reason for Co-Treatment: For patient/therapist safety;To address functional/ADL transfers PT goals addressed during session: Mobility/safety with mobility;Balance;Strengthening/ROM OT goals addressed during session: ADL's and self-care      AM-PAC OT "6 Clicks" Daily Activity     Outcome Measure   Help from another person eating meals?: Total Help from another person taking care of personal grooming?: A Lot Help from another person toileting, which includes using toliet, bedpan, or urinal?: A Lot Help from another person bathing (including washing, rinsing, drying)?: A Lot Help from another person to put on and taking off regular upper body clothing?: A Lot Help from another person to put on and taking off regular lower body clothing?: Total 6 Click Score: 10    End of Session Equipment Utilized During Treatment: Rolling walker;Oxygen  OT Visit Diagnosis: Unsteadiness on feet (R26.81);Cognitive communication deficit (R41.841);Pain Pain - part of body:  (generalized)   Activity Tolerance Patient tolerated treatment well;Patient limited by pain   Patient Left in bed;with call bell/phone within reach;with bed alarm set;with restraints reapplied   Nurse Communication Mobility status        Time: 1354-1431 OT  Time Calculation (min): 37 min  Charges: OT General Charges $OT Visit: 1 Visit OT Treatments $Self Care/Home Management : 8-22 mins  Lou Cal, OT Acute Rehabilitation Services Pager [phone] Office [phone]    Raymondo Band 06/13/2020, 5:01 PM

## 2020-06-13 NOTE — Progress Notes (Signed)
Physical Therapy Treatment Patient Details Name: Adam Fowler MRN: [id number] DOB: 1955-12-02 Today's Date: 06/13/2020    History of Present Illness Pt is a 64 yo male who presents to the ED with severe abdominal pain, nausea and vomiting. Found to have post ERCP acute pancreatits. Pt developed Hypoxic resp failure and was Intubated 8/22  and extubated 8/24. Pt also with new onset a fib and acute ileus. PMH includes HTN and ERCP.  Pt with multiple drain placements during admission and having difficulty with pain management.    PT Comments    Patient continues with little tolerance to therapy and needing increased time and much encouragement to participate.  Seen with OT to maximize participation and work on ADL along with mobility skills.  His RR up to 40's, HR up to 133, SpO2 down to 86% on 3L with mobility.  He needs more OOB time, but it is not well tolerated and patient confused.  Feel he will need SNF level rehab at d/c.  PT to continue to follow.    Follow Up Recommendations  SNF     Equipment Recommendations  Wheelchair (measurements PT);Wheelchair cushion (measurements PT);Hospital bed    Recommendations for Other Services       Precautions / Restrictions Precautions Precautions: Fall Precaution Comments: abdominal drain x 2, NG, cortrak Restrictions Weight Bearing Restrictions: No    Mobility  Bed Mobility Overal bed mobility: Needs Assistance Bed Mobility: Rolling;Sidelying to Sit;Sit to Supine Rolling: Min assist Sidelying to sit: Mod assist;+2 for safety/equipment   Sit to supine: Mod assist;+2 for physical assistance   General bed mobility comments: assist for technique, pt using rail, assist for legs off EOB and for trunk upright, protecting tubes/lines; assist for legs, trunk to supine  Transfers Overall transfer level: Needs assistance Equipment used: Rolling walker (2 wheeled) Transfers: Sit to/from Stand Sit to Stand: Mod assist;+2 physical assistance          General transfer comment: stood x 2 with lifting help and encouragement, increased time; HR up to 133, SpO2 down to 86% on 3L O2 [place] RR up to 40's  Ambulation/Gait Ambulation/Gait assistance: Mod assist;+2 physical assistance Gait Distance (Feet): 1 Feet Assistive device: Rolling walker (2 wheeled) Gait Pattern/deviations: Step-to pattern     General Gait Details: side stepping to get higher in bed with increased time, max cues and facilitation   Stairs             Wheelchair Mobility    Modified Rankin (Stroke Patients Only)       Balance Overall balance assessment: Needs assistance Sitting-balance support: Feet supported;Bilateral upper extremity supported;Single extremity supported Sitting balance-Leahy Scale: Poor Sitting balance - Comments: reliant on UE support EOB   Standing balance support: Bilateral upper extremity supported Standing balance-Leahy Scale: Poor Standing balance comment: +2 assist for standing balance                             Cognition Arousal/Alertness: Awake/alert Behavior During Therapy: Anxious Overall Cognitive Status: Impaired/Different from baseline Area of Impairment: Attention;Memory;Following commands;Safety/judgement;Awareness;Problem solving                   Current Attention Level: Focused Memory: Decreased recall of precautions;Decreased short-term memory Following Commands: Follows one step commands with increased time Safety/Judgement: Decreased awareness of safety;Decreased awareness of deficits Awareness: Intellectual Problem Solving: Slow processing;Requires verbal cues;Requires tactile cues General Comments: requires continuous cues to perform simple instructions  Exercises      General Comments General comments (skin integrity, edema, etc.): HR and RR elevated with minimal activity. HR up to 130s with activity and RR noted up to low 40s. SpO2 >/=88% on 3L       Pertinent  Vitals/Pain Pain Assessment: Faces Faces Pain Scale: Hurts whole lot Pain Location: moaning with movements Pain Descriptors / Indicators: Moaning Pain Intervention(s): Monitored during session;Repositioned;Limited activity within patient's tolerance    Home Living                      Prior Function            PT Goals (current goals can now be found in the care plan section) Acute Rehab PT Goals Patient Stated Goal: to reduce pain Progress towards PT goals: Not progressing toward goals - comment    Frequency    Min 3X/week      PT Plan Current plan remains appropriate    Co-evaluation PT/OT/SLP Co-Evaluation/Treatment: Yes Reason for Co-Treatment: For patient/therapist safety;To address functional/ADL transfers PT goals addressed during session: Mobility/safety with mobility;Balance;Strengthening/ROM OT goals addressed during session: ADL's and self-care      AM-PAC PT "6 Clicks" Mobility   Outcome Measure  Help needed turning from your back to your side while in a flat bed without using bedrails?: A Lot Help needed moving from lying on your back to sitting on the side of a flat bed without using bedrails?: A Lot Help needed moving to and from a bed to a chair (including a wheelchair)?: A Lot Help needed standing up from a chair using your arms (e.g., wheelchair or bedside chair)?: A Lot Help needed to walk in hospital room?: Total Help needed climbing 3-5 steps with a railing? : Total 6 Click Score: 10    End of Session Equipment Utilized During Treatment: Oxygen Activity Tolerance: Patient limited by pain;Treatment limited secondary to medical complications (Comment) Patient left: with call bell/phone within reach;in bed;with bed alarm set   PT Visit Diagnosis: Unsteadiness on feet (R26.81);Muscle weakness (generalized) (M62.81);Difficulty in walking, not elsewhere classified (R26.2);Pain Pain - part of body:  (generalized)     Time: 4193-7902 PT  Time Calculation (min) (ACUTE ONLY): 32 min  Charges:  $Therapeutic Activity: 8-22 mins                     Magda Kiel, PT Acute Rehabilitation Services IOXBD:[phone] Office:[phone] 06/13/2020    Adam Fowler 06/13/2020, 5:04 PM

## 2020-06-13 NOTE — Progress Notes (Signed)
Referring Physician(s): Dr. Zenia Resides  Supervising Physician: Dr. Ruthann Cancer  Patient Status:  [facility] - In-pt  Chief Complaint: High-output biliary fistula s/p cholecystectomy s/p biloma drain placement 6/7 Post-ERCP pancreatitis with gastric outlet obstruction S/p common bile duct stent placement, but with continued increased biloma drain output S/p internal/external biliary drain placement 9/21 S/p drain exchange 06/07/20 with 12Fr drains S/p drain exchange and reposition with downsize of biloma drain to 8Fr drain, I/E biliary drain remains in place.    Subjective: Confused.  In restraints. Repeating "I don't know, I don't know." 2 RUQ drains in place.  No acute events or changes noted after exchange and downsize yesterday.  NGT in place.  Ascites noted on CT A/P.  Allergies: Toradol [ketorolac tromethamine]  Medications: Prior to Admission medications   Medication Sig Start Date End Date Taking? Authorizing Provider  Calcium Carb-Cholecalciferol (CALCIUM+D3 PO) Take 1 tablet by mouth daily.   Yes [provider]  Coenzyme Q10 (COQ10) 400 MG CAPS Take 800 mg by mouth in the morning.    Yes [provider]  Cyanocobalamin (VITAMIN B-12) 5000 MCG TBDP Take 5,000 mcg by mouth daily.   Yes [provider]  GLUCOSAMINE-CHONDROITIN PO Take 1 tablet by mouth in the morning and at bedtime.    Yes [provider]  Javier Docker Oil 1000 MG CAPS Take 1,000 mg by mouth in the morning and at bedtime.    Yes [provider]  lisinopril-hydrochlorothiazide (ZESTORETIC) 10-12.5 MG tablet Take 1 tablet by mouth daily. 03/04/20  Yes [provider]  Moringa Oleifera (MORINGA PO) Take 1,000 mg by mouth 2 (two) times daily.   Yes [provider]  Multiple Vitamin (MULTIVITAMIN WITH MINERALS) TABS tablet Take 1 tablet by mouth daily.   Yes [provider]  ondansetron (ZOFRAN) 4 MG tablet Take 1 tablet (4 mg total) by mouth every 8  (eight) hours as needed for nausea or vomiting. 04/25/20  Yes Mansouraty, Telford Nab., MD  OVER THE COUNTER MEDICATION Take 1 tablet by mouth See admin instructions. [facility] otc tablet- Take 1 tablet by mouth once a day   Yes [provider]  oxyCODONE (OXY IR/ROXICODONE) 5 MG immediate release tablet Take 5 mg by mouth 2 (two) times daily as needed (for pain).  04/25/20  Yes [provider]  pravastatin (PRAVACHOL) 80 MG tablet Take 40 mg by mouth in the morning and at bedtime.   Yes [provider]  Red Yeast Rice 600 MG CAPS Take 1,200 mg by mouth at bedtime.    Yes [provider]  sertraline (ZOLOFT) 100 MG tablet Take 100 mg by mouth daily. 11/08/19  Yes [provider]  TURMERIC PO Take 1,500 mg by mouth 2 (two) times daily.   Yes [provider]  zolpidem (AMBIEN) 5 MG tablet Take 5 mg by mouth at bedtime. 12/29/19  Yes [provider]  ibuprofen (ADVIL) 200 MG tablet Take 400-600 mg by mouth every 6 (six) hours as needed for headache or moderate pain.    [provider]     Vital Signs: BP 134/85 (BP Location: Left Arm)   Pulse (!) 116   Temp 99.5 F (37.5 C) (Oral)   Resp (!) 22   Ht 5\' 9"  (1.753 m)   Wt 193 lb 2 oz (87.6 kg)   SpO2 98%   BMI 28.52 kg/m   Physical Exam  NAD, alert Abdomen: Anterior drain (biloma drain) in place. Insertion site c/d/i.  Non-bloody,  thin, hazy serous fluid.  Posterior (internal/external biliary drain) in place. Insertion site c/d/i.  Red bilious output in collection bag.   Imaging: IR Catheter Tube Change  Result Date: 06/12/2020 CLINICAL DATA:  History of cholecystitis complicated by postoperative leak from the cystic duct remnant requiring percutaneous drainage catheter placement on 02/13/2020. Patient subsequent underwent ERCP with internal biliary stent placement however this procedure was complicated by development of pancreatitis. Patient has since undergone  internal/external right-sided biliary drainage catheter placement on 05/30/2020 with subsequent biliary drain exchange and up sizing on 06/07/2020 Patient now returns for biloma drainage catheter injection, repositioning and downsizing at the request of consulting hepatic biliary surgeon, Dr. Zenia Resides. EXAM: FLUOROSCOPIC GUIDED BILOMA DRAINAGE CATHETER EXCHANGE, DOWNSIZING AND REPOSITIONING COMPARISON:  Percutaneous biliary drainage catheter exchange and up sizing-06/07/2020; CT abdomen and pelvis-06/08/2020 CONTRAST:  10 cc Omnipaque 300 - administered via the percutaneous drainage catheter. MEDICATIONS: None. ANESTHESIA/SEDATION: Moderate (conscious) sedation was employed during this procedure. A total of Versed 0.5 mg and Fentanyl 25 mcg was administered intravenously. Moderate Sedation Time: 10 minutes. The patient's level of consciousness and vital signs were monitored continuously by radiology nursing throughout the procedure under my direct supervision. FLUOROSCOPY TIME:  42 seconds (10 mGy) TECHNIQUE: Patient was positioned supine on the fluoroscopy table. The external portion of the existing percutaneous drainage catheter as well as the surrounding skin was prepped and draped in usual sterile fashion. A preprocedural spot fluoroscopic image was obtained of the existing percutaneous drainage catheter. A small amount of contrast was injected via the existing percutaneous drainage catheter and several fluoroscopic images were obtained in various obliquities. The external portion of the percutaneous drainage catheter was cut and cannulated with a short Amplatz wire. Under intermittent fluoroscopic guidance, the existing percutaneous drainage catheter was exchanged for a new slightly smaller now 8 French percutaneous drainage catheter with end coiled and locked within the more peripheral aspect of the decompressed abscess cavity/biloma. Contrast injection confirmed appropriate position functionality of the  percutaneous drainage catheter. The percutaneous drainage catheter was connected to a gravity bag and secured in place within interrupted suture and a StatLock device. A dressing was applied. The patient tolerated the procedure well without immediate postprocedural complication. FINDINGS: Preprocedural spot fluoroscopic image demonstrates unchanged positioning of the biloma drainage catheter with end coiled and locked overlying the expected location of the gallbladder fossa. An internal biliary stent overlies expected location of the CBD. An internal-external percutaneous biliary drainage catheter is unchanged in positioning with radiopaque marker located at the level of the central aspect of the right intrahepatic biliary tree. A feeding enteric tube tip is excluded from view, at least within the horizontal segment of the duodenum. There is an additional decompressive nasogastric tube with tip overlying the gastric fundus. Contrast injection of the biloma drainage catheter demonstrates brisk opacification of the decompressed abscess cavity/biloma to the cystic duct remnant which communicates with the common bile duct. After fluoroscopic guided exchange and downsizing, the slightly smaller 8 [place] all-purpose drainage catheter is positioned within the more lateral aspect of the biloma cavity. IMPRESSION: 1. Successful fluoroscopic guided exchange, downsizing and repositioning of now 8 [place] all-purpose drainage catheter with end located within the more peripheral aspect of the well-defined biloma cavity. 2. Persistent communication between the biloma cavity and the cystic duct remnant. PLAN: - Do not flush the biloma drainage catheter. - Recommend maintaining diligent records regarding daily catheter output. Electronically Signed   By: Sandi Mariscal M.D.   On: 06/12/2020 16:16  Labs:  CBC: Recent Labs    06/10/20 0500 06/11/20 0545 06/12/20 0347 06/13/20 0407  WBC 18.1* 17.0* 15.1* 15.5*  HGB 7.2*  7.6* 7.5* 7.7*  HCT 25.9* 27.3* 26.7* 27.5*  PLT 302 307 326 326    COAGS: Recent Labs    02/02/20 2115 05/16/20 1905 05/29/20 0404 05/31/20 1415  INR 1.1 1.3* 1.4* 1.5*    BMP: Recent Labs    06/09/20 0614 06/09/20 0614 06/10/20 0500 06/11/20 0440 06/12/20 0347 06/13/20 0407  NA 143   < > 142 143 143 144  K 4.1   < > 3.9 4.1 4.1 4.0  CL 112*   < > 111 112* 110 110  CO2 23   < > 23 24 25 25   GLUCOSE 186*   < > 165* 160* 178* 162*  BUN 35*   < > 36* 36* 36* 33*  CALCIUM 8.7*   < > 8.5* 8.5* 8.5* 8.5*  CREATININE 0.66   < > 0.68 0.70 0.76 0.77  GFRNONAA >60   < > >60 >60 >60 >60  GFRAA >60  --  >60 >60 >60  --    < > = values in this interval not displayed.    LIVER FUNCTION TESTS: Recent Labs    06/09/20 0614 06/11/20 0440 06/12/20 0347 06/13/20 0407  BILITOT 0.7 1.0 0.9 0.9  AST 23 28 28  37  ALT 28 35 35 41  ALKPHOS 232* 200* 185* 162*  PROT 6.3* 6.4* 6.3* 6.4*  ALBUMIN 1.6* 1.6* 1.5* 1.5*    Assessment and Plan: Bile leak Acute Pancreatitis  5/18: subtotal cholecystectomy  6/7:  Bile leak, Gall bladder fossa drain placement by Dr. Annamaria Boots 7/14: ERCP/stent placement with ongoing to high-output bile duct remnant, ERCP pancreatitis 8/18: repeat ERCP 8/19: admitted with acute pancreatitis 9/9: EGD with GOO, gastritis, duodenitis 9/11: S/p internal/external biliary drain placement by Dr. Serafina Royals, 3.0 liter paracentesis- noted occlusion of the distal aspect of the biliary stent. 9/21: Esophagitis and gastritis found on repeat EGD yesterday  9/30:  S/p exchange of both biloma and I/E drains to 12 Fr catheters.  10/5: Downsize of biloma drain to 8Fr, repositioned more proximally  Patient assessed at bedside this AM. WBC 15.5 Tbili 0.9 AMS ongoing. Both drains to gravity today.  No change in output.  Abdomen distended.  NGT tube post-pyloric, moderate ascites-- no related complaints.  IR following.  Continue current care plan. Do not flush biloma drain  per Dr. Pascal Lux instructions.   Electronically Signed: Docia Barrier, PA 06/13/2020, 1:51 PM   I spent a total of 15 Minutes at the the patient's bedside AND on the patient's hospital floor or unit, greater than 50% of which was counseling/coordinating care for biliary obstruction

## 2020-06-14 DIAGNOSIS — Z7189 Other specified counseling: Secondary | ICD-10-CM

## 2020-06-14 LAB — GLUCOSE, CAPILLARY
Glucose-Capillary: 127 mg/dL — ABNORMAL HIGH (ref 70–99)
Glucose-Capillary: 139 mg/dL — ABNORMAL HIGH (ref 70–99)
Glucose-Capillary: 140 mg/dL — ABNORMAL HIGH (ref 70–99)
Glucose-Capillary: 129 mg/dL — ABNORMAL HIGH (ref 70–99)
Glucose-Capillary: 147 mg/dL — ABNORMAL HIGH (ref 70–99)

## 2020-06-14 LAB — MAGNESIUM: Magnesium: 2.1 mg/dL (ref 1.7–2.4)

## 2020-06-14 LAB — PHOSPHORUS: Phosphorus: 4 mg/dL (ref 2.5–4.6)

## 2020-06-14 MED ORDER — FUROSEMIDE 10 MG/ML IJ SOLN
40.0000 mg | Freq: Once | INTRAMUSCULAR | Status: AC
  Administered 2020-06-14: 40 mg via INTRAVENOUS
  Filled 2020-06-14: qty 4

## 2020-06-14 MED ORDER — TRAVASOL 10 % IV SOLN
INTRAVENOUS | Status: DC
  Filled 2020-06-14: qty 1440

## 2020-06-14 NOTE — Progress Notes (Signed)
16 Days Post-Op  Subjective: Percutaneous gallbladder drain downsized and retracted by IR 2 days ago. Drain appears nonbilious this morning. Patient had a low grade fever overnight, is tachycardic and disoriented this morning.   Objective: Vital signs in last 24 hours: Temp:  [98.4 F (36.9 C)-101.2 F (38.4 C)] 99.2 F (37.3 C) (10/07 0755) Pulse Rate:  [109-118] 112 (10/07 0755) Resp:  [17-24] 21 (10/07 0755) BP: (121-138)/(72-85) 136/79 (10/07 0755) SpO2:  [93 %-98 %] 94 % (10/07 0755) Weight:  [88.6 kg] 88.6 kg (10/07 0500) Last BM Date: 06/09/20  Intake/Output from previous day: 10/06 0701 - 10/07 0700 In: 5  Out: 2350 [Urine:1200; Emesis/NG output:850; Drains:100] Intake/Output this shift: No intake/output data recorded.  PE: General: NAD Neuro: alert, disoriented Resp: normal work of breathing on nasal cannula CV: tachycardic to 120, regular rhythm Abdomen: distended but soft and nontender HEENT: NG and NJ tubes in place  Lab Results:  Recent Labs    06/12/20 0347 06/13/20 0407  WBC 15.1* 15.5*  HGB 7.5* 7.7*  HCT 26.7* 27.5*  PLT 326 326   BMET Recent Labs    06/12/20 0347 06/13/20 0407  NA 143 144  K 4.1 4.0  CL 110 110  CO2 25 25  GLUCOSE 178* 162*  BUN 36* 33*  CREATININE 0.76 0.77  CALCIUM 8.5* 8.5*   PT/INR No results for input(s): LABPROT, INR in the last 72 hours. CMP     Component Value Date/Time   NA 144 06/13/2020 0407   K 4.0 06/13/2020 0407   CL 110 06/13/2020 0407   CO2 25 06/13/2020 0407   GLUCOSE 162 (H) 06/13/2020 0407   BUN 33 (H) 06/13/2020 0407   CREATININE 0.77 06/13/2020 0407   CALCIUM 8.5 (L) 06/13/2020 0407   PROT 6.4 (L) 06/13/2020 0407   ALBUMIN 1.5 (L) 06/13/2020 0407   AST 37 06/13/2020 0407   ALT 41 06/13/2020 0407   ALKPHOS 162 (H) 06/13/2020 0407   BILITOT 0.9 06/13/2020 0407   GFRNONAA >60 06/13/2020 0407   GFRAA >60 06/12/2020 0347   Lipase     Component Value Date/Time   LIPASE 27  05/16/2020 1905       Studies/Results: DG Chest 1 View  Result Date: 06/13/2020 CLINICAL DATA:  Abdominal distension. EXAM: CHEST  1 VIEW COMPARISON:  Chest radiograph 06/01/2020 FINDINGS: Right upper extremity PICC tip the atrial caval junction. Enteric tube is well as weighted feeding tube in place. Drainage catheter in the right upper quadrant partially included. Similar cardiomegaly. Low lung volumes. Stable retrocardiac opacity. Bronchovascular crowding versus vascular congestion, with ill-defined bilateral lung opacities. Minimal fluid in the right minor fissure, with suspected sub pulmonic effusion. No visualized pneumothorax. IMPRESSION: 1. Low lung volumes. Stable retrocardiac opacity, likely combination of pleural effusions and atelectasis/airspace disease. 2. Bronchovascular crowding versus vascular congestion. Suspected right sub pulmonic effusion with minimal fluid in the right minor fissure. 3. Stable cardiomegaly. Electronically Signed   By: Keith Rake M.D.   On: 06/13/2020 18:51   DG Abd 1 View  Result Date: 06/13/2020 CLINICAL DATA:  Abdominal distension. EXAM: ABDOMEN - 1 VIEW COMPARISON:  CT 06/08/2020 FINDINGS: Enteric tube in place tip in the stomach. Weighted feeding tube in the region of the proximal jejunum. Biliary stent with biliary catheter extending into the duodenum. There is an additional right upper quadrant drain. Generalized paucity of bowel gas. No bowel dilatation to suggest obstruction. Small volume of formed stool in the ascending, transverse, and rectosigmoid colon.  IMPRESSION: 1. Paucity of bowel gas without bowel dilatation to suggest obstruction. 2. Enteric tube in place tip in the stomach. Weighted feeding tube in the region of the proximal jejunum. 3. Biliary stent and biliary catheter in place. Additional right upper quadrant drain. Electronically Signed   By: Keith Rake M.D.   On: 06/13/2020 18:53   IR Catheter Tube Change  Result Date:  06/12/2020 CLINICAL DATA:  History of cholecystitis complicated by postoperative leak from the cystic duct remnant requiring percutaneous drainage catheter placement on 02/13/2020. Patient subsequent underwent ERCP with internal biliary stent placement however this procedure was complicated by development of pancreatitis. Patient has since undergone internal/external right-sided biliary drainage catheter placement on 05/30/2020 with subsequent biliary drain exchange and up sizing on 06/07/2020 Patient now returns for biloma drainage catheter injection, repositioning and downsizing at the request of consulting hepatic biliary surgeon, Dr. Zenia Resides. EXAM: FLUOROSCOPIC GUIDED BILOMA DRAINAGE CATHETER EXCHANGE, DOWNSIZING AND REPOSITIONING COMPARISON:  Percutaneous biliary drainage catheter exchange and up sizing-06/07/2020; CT abdomen and pelvis-06/08/2020 CONTRAST:  10 cc Omnipaque 300 - administered via the percutaneous drainage catheter. MEDICATIONS: None. ANESTHESIA/SEDATION: Moderate (conscious) sedation was employed during this procedure. A total of Versed 0.5 mg and Fentanyl 25 mcg was administered intravenously. Moderate Sedation Time: 10 minutes. The patient's level of consciousness and vital signs were monitored continuously by radiology nursing throughout the procedure under my direct supervision. FLUOROSCOPY TIME:  42 seconds (10 mGy) TECHNIQUE: Patient was positioned supine on the fluoroscopy table. The external portion of the existing percutaneous drainage catheter as well as the surrounding skin was prepped and draped in usual sterile fashion. A preprocedural spot fluoroscopic image was obtained of the existing percutaneous drainage catheter. A small amount of contrast was injected via the existing percutaneous drainage catheter and several fluoroscopic images were obtained in various obliquities. The external portion of the percutaneous drainage catheter was cut and cannulated with a short Amplatz wire.  Under intermittent fluoroscopic guidance, the existing percutaneous drainage catheter was exchanged for a new slightly smaller now 8 French percutaneous drainage catheter with end coiled and locked within the more peripheral aspect of the decompressed abscess cavity/biloma. Contrast injection confirmed appropriate position functionality of the percutaneous drainage catheter. The percutaneous drainage catheter was connected to a gravity bag and secured in place within interrupted suture and a StatLock device. A dressing was applied. The patient tolerated the procedure well without immediate postprocedural complication. FINDINGS: Preprocedural spot fluoroscopic image demonstrates unchanged positioning of the biloma drainage catheter with end coiled and locked overlying the expected location of the gallbladder fossa. An internal biliary stent overlies expected location of the CBD. An internal-external percutaneous biliary drainage catheter is unchanged in positioning with radiopaque marker located at the level of the central aspect of the right intrahepatic biliary tree. A feeding enteric tube tip is excluded from view, at least within the horizontal segment of the duodenum. There is an additional decompressive nasogastric tube with tip overlying the gastric fundus. Contrast injection of the biloma drainage catheter demonstrates brisk opacification of the decompressed abscess cavity/biloma to the cystic duct remnant which communicates with the common bile duct. After fluoroscopic guided exchange and downsizing, the slightly smaller 8 [place] all-purpose drainage catheter is positioned within the more lateral aspect of the biloma cavity. IMPRESSION: 1. Successful fluoroscopic guided exchange, downsizing and repositioning of now 8 [place] all-purpose drainage catheter with end located within the more peripheral aspect of the well-defined biloma cavity. 2. Persistent communication between the biloma cavity and the cystic  duct remnant. PLAN: - Do not flush the biloma drainage catheter. - Recommend maintaining diligent records regarding daily catheter output. Electronically Signed   By: Sandi Mariscal M.D.   On: 06/12/2020 16:16    Anti-infectives: Anti-infectives (From admission, onward)   Start     Dose/Rate Route Frequency Ordered Stop   06/04/20 1400  Ampicillin-Sulbactam (UNASYN) 3 g in sodium chloride 0.9 % 100 mL IVPB        3 g 200 mL/hr over 30 Minutes Intravenous Every 6 hours 06/04/20 0906     06/02/20 0300  meropenem (MERREM) 2 g in sodium chloride 0.9 % 100 mL IVPB  Status:  Discontinued        2 g 200 mL/hr over 30 Minutes Intravenous Every 8 hours 06/02/20 0248 06/04/20 0906   06/02/20 0300  vancomycin (VANCOCIN) IVPB 1000 mg/200 mL premix  Status:  Discontinued        1,000 mg 200 mL/hr over 60 Minutes Intravenous Every 12 hours 06/02/20 0248 06/04/20 0906   06/01/20 1000  piperacillin-tazobactam (ZOSYN) IVPB 3.375 g  Status:  Discontinued        3.375 g 12.5 mL/hr over 240 Minutes Intravenous Every 8 hours 06/01/20 0952 06/02/20 0248   05/29/20 0800  piperacillin-tazobactam (ZOSYN) IVPB 3.375 g        3.375 g 12.5 mL/hr over 240 Minutes Intravenous To Radiology 05/28/20 1328 05/29/20 1943   05/04/20 1230  meropenem (MERREM) 1 g in sodium chloride 0.9 % 100 mL IVPB  Status:  Discontinued        1 g 200 mL/hr over 30 Minutes Intravenous Every 8 hours 05/04/20 1156 05/19/20 0826   05/01/20 1400  piperacillin-tazobactam (ZOSYN) IVPB 3.375 g        3.375 g 12.5 mL/hr over 240 Minutes Intravenous Every 8 hours 05/01/20 1013 05/03/20 0907   04/29/20 2000  vancomycin (VANCOCIN) IVPB 1000 mg/200 mL premix  Status:  Discontinued        1,000 mg 200 mL/hr over 60 Minutes Intravenous Every 12 hours 04/29/20 0653 04/30/20 0831   04/29/20 0745  vancomycin (VANCOREADY) IVPB 1500 mg/300 mL        1,500 mg 150 mL/hr over 120 Minutes Intravenous  Once 04/29/20 0653 04/29/20 1129   04/26/20 2300   piperacillin-tazobactam (ZOSYN) IVPB 3.375 g        3.375 g 12.5 mL/hr over 240 Minutes Intravenous Every 8 hours 04/26/20 1716 05/01/20 0559   04/26/20 1700  piperacillin-tazobactam (ZOSYN) IVPB 3.375 g        3.375 g 100 mL/hr over 30 Minutes Intravenous  Once 04/26/20 1655 04/26/20 1844       Assessment/Plan 64 yo male with history of subtotal cholecystectomy, c/b persistent biliary fistula, s/p ERCP with stent placement c/b necrotizing pancreatitis. Perc drain downsized and retracted 2 days ago, and today is nonbilious in Scientist, research (physical sciences). PTBD appears to be functioning and per bedside RN is being flushed, however the output is not being charted.  - Continue flushing PTBD, need to have accurate documentation of drain output. Will not cap today in the setting of recent fevers and tachycardia. If persistent fevers plan to rescan. - Do not flush 8 Fr gallbladder drain - Continue TPN  I had a discussion with the patient's wife over the phone this morning. She is understandably frustrated that he has not made significant improvements. I discussed that I think it is still possible for the patient to recover, but it will be a prolonged course that will  take months, as is typical with necrotizing pancreatitis. I anticipate prolonged TPN dependence and I discussed that with her. She is going to think about her options and goals of care.    LOS: 52 days    Michaelle Birks, MD [facility] General, Hepatobiliary and Pancreatic Surgery 06/14/20 10:01 AM

## 2020-06-14 NOTE — Progress Notes (Signed)
Provider Stark Klein, NP notified of MEWS red score and abnormal vs, page sent x2. After second page, NP calls and states to secure chat her. Secure chat sent and NP puts in orders. Medications administered, will continue to monitor

## 2020-06-14 NOTE — Progress Notes (Signed)
Increased work of breathing noted. Patient attempted to pull self up in bed. HR went up to 130s and sats went down to 80s. 02 nasal cannula replaced with simple mask 4L at Fo2 40%. Patient continues to mouth breathe.  0407- Vitals- 113, 96, 18, 134/81.  Patient c/o back pain. Fentanyl 7mcg given via picc line. Patient attempted to blow his nose, saying he believes its his sinuses. Educated about the nasal cannula possibly drying his nose out but water bottle on 02 already. Will cont to observe. Report given to [facility]

## 2020-06-14 NOTE — Progress Notes (Signed)
PROGRESS NOTE    Adam Fowler  DXA:[id number] DOB: 03/17/1956 DOA: 04/26/2020 PCP: Melony Overly, MD    Brief Narrative: 64 yr old man with a past medical history significant for HTN, insomnia, and renal lithiasis. Marland Kitchen He had undergone a cholecystectomy with placement of JP drain on 01/24/2020. On 02/02/2020 he was found to have a fluid collection in the the gallbladder fossa. On 6/7 this collection had developed bubbles and a second collection has formed along the right edge of the liver. At this time previous drains were removed and new drain was placed by IR. CBD stent was placed via ERCP on 03/21/2020. He next presented on 04/25/2020 for repeat ERCP after biliary leak following recent cholecystectomy with abscess after percutaneous drainage. The patient had complained of severe abdominal pain on this presentation. He was admitted with post ERCP pancreatitis. He decompensated 8/21/-24 with respiratory failure due to bilateral pleural effusions, atrial fibrillation with RVR. He required mechanical ventilation for volume overload and aspiration pneumonia. Another ERCP was attempted on 05/17/2020. This was not successful due to GOO. The patient was started on TPN on 9/10. On 05/31/2020 the patient had a decompensation with abdominal pain and hypotension, and he was transferred back to the ICU. Despite TPN and ongoing therapies the patient has become quite weak and debilited.  The patient was transferred out of the ICU to the care of the hospitalists service on 06/11/2020 by PCCM. The patient continues to require TPN and will require it for months to come per surgery and gastroenterology. 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 The drain is within the duodenum appropriately and is functioning, although he continues to have persistent leakage. There is a months old well-defined tract now. Dr Zenia Resides discussed with IR whether  repositioning the drain further down the GI tract would allow the fistula to close. Dr. Pascal Lux did not feel that the patient would benefit from any further intervention at this time. Gastroenterology does not have any further plans for additional endoscopy now in light of the GOO. The SMV is nearly occluded. Supportive care is recommended.  8/20 admit 8/21-24 admitted 9/21 perc bilary drainage  9/23 drain capped. Acute decompensation in afternoon. Fluid resuscitation, starting on pressors and transferring to ICU  9/27 started fentanyl infusion, improvement in pain control; concern for pill fragments in GB fossa drain 9/28 placed blue food coloring in NG tube, not seen in GB fossa drain 9/29 started trophic tube feedings 9/30 injection of GB drains 10/2 tube feeds held due to concern about reflux 10/3 transfer to progressive care 10/7 overnight events patient became hypoxic to the 80s with tachycardia and tachypnea. Chest x-ray shows pleural effusion possible fluid overload KUB shows a lot of gas with no evidence of obstruction Assessment & Plan:   Principal Problem:   Post-ERCP acute pancreatitis Active Problems:   Severe sepsis (HCC)   Polycythemia   AKI (acute kidney injury) ([place])   Metabolic acidosis   Acute respiratory failure with hypoxia (HCC)   Bilateral pleural effusion   Ileus (HCC)   Bile duct leak   Gastric outlet obstruction   Abdominal distention   ARDS (adult respiratory distress syndrome) ([place])   Biloma following surgery   Acute blood loss anemia   Palliative care encounter   Chest pain   Acute cholecystitis   Encephalopathy acute    Persistent bile leak after cholecystectomy on 01/24/20 with 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    Hypoxia patient became hypoxic overnight to 80s and was placed on oxygen.  Currently he is 93% on 3 L.  Chest x-ray shows pleural effusions.   We will give him Lasix as needed.  T-max 101.1 will get blood cultures. ?  If he aspirated he is already on Unasyn 06/04/2020 followed by ID.    Severe protein calorie malnutrition: Tube feeds held 10/02 due to concern about refluxing into NG tube. Continue TPN. Biliary drains per CCS, GI, IR S/P downsizing and repositioning of drainage catheter with and located within the more peripheral aspect of the well defined biloma cavity.  Drain injection demonstrates persistent communication between the cavity and the cystic duct remnant.  Peritonitis with Enterococcal and Klebsiella bacteremia: IV Unasyn as per infectious disease. He will need TEE at some point when more stable.  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  Hypercholesterolemia: Continue pravachol as at home.  Hyperglycemia: Glucoses will be followed with SSI.  Urine retention: Continue flomax.  Anemia of critical illness,chronic disease, and iron deficiency: Hemoglobin relatively stable at about 7.5 for the last 3 days. Monitor. IV iron per pharmacy.  Transfuse for Hgb < 7 or significant bleeding.  Deconditioning: PT/OT  hypoalbumnemia-alb 1.5     Nutrition Problem: Inadequate oral intake Etiology: acute illness     Signs/Symptoms: NPO status    Interventions: TPN  Estimated body mass index is 28.84 kg/m as calculated from the following:   Height as of this encounter: 5\' 9"  (1.753 m).   Weight as of this encounter: 88.6 kg.  DVT prophylaxis: Lovenox Code Status: DNR Family Communication discussed with patient's wife in the room  disposition Plan:  Status is: Inpatient  Dispo:  Patient From:  Home  Planned Disposition: To be determined  Expected discharge date:  06/18/20  Medically stable for discharge:  No   Consultants:   PCCM, GI, interventional radiology palliative care  Antimicrobials:  Anti-infectives (From admission, onward)   Start     Dose/Rate Route Frequency Ordered Stop   06/04/20 1400  Ampicillin-Sulbactam (UNASYN) 3 g in sodium chloride 0.9 % 100 mL IVPB        3 g 200 mL/hr over 30 Minutes Intravenous Every 6 hours 06/04/20 0906     06/02/20 0300  meropenem (MERREM) 2 g in sodium chloride 0.9 % 100 mL IVPB  Status:  Discontinued        2 g 200 mL/hr over 30 Minutes Intravenous Every 8 hours 06/02/20 0248 06/04/20 0906   06/02/20 0300  vancomycin (VANCOCIN) IVPB 1000 mg/200 mL premix  Status:  Discontinued        1,000 mg 200 mL/hr over 60 Minutes Intravenous Every 12 hours 06/02/20 0248 06/04/20 0906   06/01/20 1000  piperacillin-tazobactam (ZOSYN) IVPB 3.375 g  Status:  Discontinued        3.375 g 12.5 mL/hr over 240 Minutes Intravenous Every 8 hours 06/01/20 0952 06/02/20 0248   05/29/20 0800  piperacillin-tazobactam (ZOSYN) IVPB 3.375 g        3.375 g 12.5 mL/hr over 240 Minutes Intravenous To Radiology 05/28/20 1328 05/29/20 1943   05/04/20 1230  meropenem (MERREM) 1 g in sodium chloride 0.9 % 100 mL IVPB  Status:  Discontinued        1 g  200 mL/hr over 30 Minutes Intravenous Every 8 hours 05/04/20 1156 05/19/20 0826   05/01/20 1400  piperacillin-tazobactam (ZOSYN) IVPB 3.375 g        3.375 g 12.5 mL/hr over 240 Minutes Intravenous Every 8 hours 05/01/20 1013 05/03/20 0907   04/29/20 2000  vancomycin (VANCOCIN) IVPB 1000 mg/200 mL premix  Status:  Discontinued        1,000 mg 200 mL/hr over 60 Minutes Intravenous Every 12 hours 04/29/20 0653 04/30/20 0831   04/29/20 0745  vancomycin (VANCOREADY) IVPB 1500 mg/300 mL        1,500 mg 150 mL/hr over 120 Minutes Intravenous  Once 04/29/20 0653 04/29/20 1129   04/26/20 2300  piperacillin-tazobactam (ZOSYN) IVPB 3.375 g        3.375 g 12.5 mL/hr over 240 Minutes Intravenous  Every 8 hours 04/26/20 1716 05/01/20 0559   04/26/20 1700  piperacillin-tazobactam (ZOSYN) IVPB 3.375 g        3.375 g 100 mL/hr over 30 Minutes Intravenous  Once 04/26/20 1655 04/26/20 1844       Subjective: Patient restless moaning and groaning Overnight patient became hypoxic tachypneic and tachycardic He still remains tachycardic and tachypneic on 3 L of oxygen  Objective: Vitals:   06/14/20 0400 06/14/20 0500 06/14/20 0755 06/14/20 1110  BP: 134/81  136/79 132/78  Pulse: (!) 113  (!) 112 (!) 120  Resp: 19  (!) 21 (!) 23  Temp: 99.4 F (37.4 C)  99.2 F (37.3 C) 99.8 F (37.7 C)  TempSrc:   Axillary Axillary  SpO2: 94%  94% 93%  Weight:  88.6 kg    Height:        Intake/Output Summary (Last 24 hours) at 06/14/2020 1324 Last data filed at 06/14/2020 1047 Gross per 24 hour  Intake 5 ml  Output 2400 ml  Net -2395 ml   Filed Weights   06/08/20 0459 06/12/20 0500 06/14/20 0500  Weight: 87.6 kg 87.6 kg 88.6 kg    Examination:  General exam: Appears calm and comfortable  Respiratory system: Diminished at the bases with scattered rhonchi to auscultation. Respiratory effort normal. Cardiovascular system: S1 & S2 heard, RRR. No JVD, murmurs, rubs, gallops or clicks. No pedal edema. Gastrointestinal system: Abdomen is very distended, soft and tender. No organomegaly or masses felt.  Diminished bowel sounds heard. Central nervous system: Confused and disoriented extremities: Symmetric 5 x 5 power. Skin: No rashes, lesions or ulcers Psychiatry: Unable to assess   Data Reviewed: I have personally reviewed following labs and imaging studies  CBC: Recent Labs  Lab 06/09/20 0614 06/10/20 0500 06/11/20 0545 06/12/20 0347 06/13/20 0407  WBC 20.5* 18.1* 17.0* 15.1* 15.5*  NEUTROABS  --   --  14.1* 12.6* 12.6*  HGB 7.5* 7.2* 7.6* 7.5* 7.7*  HCT 26.4* 25.9* 27.3* 26.7* 27.5*  MCV 91.3 90.9 91.3 89.0 89.0  PLT 306 302 307 326 622   Basic Metabolic Panel: Recent Labs   Lab 06/09/20 0614 06/10/20 0500 06/11/20 0440 06/12/20 0347 06/13/20 0407 06/14/20 0445  NA 143 142 143 143 144  --   K 4.1 3.9 4.1 4.1 4.0  --   CL 112* 111 112* 110 110  --   CO2 23 23 24 25 25   --   GLUCOSE 186* 165* 160* 178* 162*  --   BUN 35* 36* 36* 36* 33*  --   CREATININE 0.66 0.68 0.70 0.76 0.77  --   CALCIUM 8.7* 8.5* 8.5* 8.5* 8.5*  --  MG 2.3  --  2.2  --   --  2.1  PHOS  --   --  4.3  --   --  4.0   GFR: Estimated Creatinine Clearance: 102.8 mL/min (by C-G formula based on SCr of 0.77 mg/dL). Liver Function Tests: Recent Labs  Lab 06/09/20 0614 06/11/20 0440 06/12/20 0347 06/13/20 0407  AST 23 28 28  37  ALT 28 35 35 41  ALKPHOS 232* 200* 185* 162*  BILITOT 0.7 1.0 0.9 0.9  PROT 6.3* 6.4* 6.3* 6.4*  ALBUMIN 1.6* 1.6* 1.5* 1.5*   No results for input(s): LIPASE, AMYLASE in the last 168 hours. No results for input(s): AMMONIA in the last 168 hours. Coagulation Profile: No results for input(s): INR, PROTIME in the last 168 hours. Cardiac Enzymes: No results for input(s): CKTOTAL, CKMB, CKMBINDEX, TROPONINI in the last 168 hours. BNP (last 3 results) No results for input(s): PROBNP in the last 8760 hours. HbA1C: No results for input(s): HGBA1C in the last 72 hours. CBG: Recent Labs  Lab 06/13/20 1908 06/13/20 2329 06/14/20 0358 06/14/20 0752 06/14/20 1109  GLUCAP 133* 133* 127* 139* 140*   Lipid Profile: No results for input(s): CHOL, HDL, LDLCALC, TRIG, CHOLHDL, LDLDIRECT in the last 72 hours. Thyroid Function Tests: No results for input(s): TSH, T4TOTAL, FREET4, T3FREE, THYROIDAB in the last 72 hours. Anemia Panel: No results for input(s): VITAMINB12, FOLATE, FERRITIN, TIBC, IRON, RETICCTPCT in the last 72 hours. Sepsis Labs: No results for input(s): PROCALCITON, LATICACIDVEN in the last 168 hours.  No results found for this or any previous visit (from the past 240 hour(s)).       Radiology Studies: DG Chest 1 View  Result Date:  06/13/2020 CLINICAL DATA:  Abdominal distension. EXAM: CHEST  1 VIEW COMPARISON:  Chest radiograph 06/01/2020 FINDINGS: Right upper extremity PICC tip the atrial caval junction. Enteric tube is well as weighted feeding tube in place. Drainage catheter in the right upper quadrant partially included. Similar cardiomegaly. Low lung volumes. Stable retrocardiac opacity. Bronchovascular crowding versus vascular congestion, with ill-defined bilateral lung opacities. Minimal fluid in the right minor fissure, with suspected sub pulmonic effusion. No visualized pneumothorax. IMPRESSION: 1. Low lung volumes. Stable retrocardiac opacity, likely combination of pleural effusions and atelectasis/airspace disease. 2. Bronchovascular crowding versus vascular congestion. Suspected right sub pulmonic effusion with minimal fluid in the right minor fissure. 3. Stable cardiomegaly. Electronically Signed   By: Keith Rake M.D.   On: 06/13/2020 18:51   DG Abd 1 View  Result Date: 06/13/2020 CLINICAL DATA:  Abdominal distension. EXAM: ABDOMEN - 1 VIEW COMPARISON:  CT 06/08/2020 FINDINGS: Enteric tube in place tip in the stomach. Weighted feeding tube in the region of the proximal jejunum. Biliary stent with biliary catheter extending into the duodenum. There is an additional right upper quadrant drain. Generalized paucity of bowel gas. No bowel dilatation to suggest obstruction. Small volume of formed stool in the ascending, transverse, and rectosigmoid colon. IMPRESSION: 1. Paucity of bowel gas without bowel dilatation to suggest obstruction. 2. Enteric tube in place tip in the stomach. Weighted feeding tube in the region of the proximal jejunum. 3. Biliary stent and biliary catheter in place. Additional right upper quadrant drain. Electronically Signed   By: Keith Rake M.D.   On: 06/13/2020 18:53   IR Catheter Tube Change  Result Date: 06/12/2020 CLINICAL DATA:  History of cholecystitis complicated by postoperative  leak from the cystic duct remnant requiring percutaneous drainage catheter placement on 02/13/2020. Patient subsequent underwent ERCP  with internal biliary stent placement however this procedure was complicated by development of pancreatitis. Patient has since undergone internal/external right-sided biliary drainage catheter placement on 05/30/2020 with subsequent biliary drain exchange and up sizing on 06/07/2020 Patient now returns for biloma drainage catheter injection, repositioning and downsizing at the request of consulting hepatic biliary surgeon, Dr. Zenia Resides. EXAM: FLUOROSCOPIC GUIDED BILOMA DRAINAGE CATHETER EXCHANGE, DOWNSIZING AND REPOSITIONING COMPARISON:  Percutaneous biliary drainage catheter exchange and up sizing-06/07/2020; CT abdomen and pelvis-06/08/2020 CONTRAST:  10 cc Omnipaque 300 - administered via the percutaneous drainage catheter. MEDICATIONS: None. ANESTHESIA/SEDATION: Moderate (conscious) sedation was employed during this procedure. A total of Versed 0.5 mg and Fentanyl 25 mcg was administered intravenously. Moderate Sedation Time: 10 minutes. The patient's level of consciousness and vital signs were monitored continuously by radiology nursing throughout the procedure under my direct supervision. FLUOROSCOPY TIME:  42 seconds (10 mGy) TECHNIQUE: Patient was positioned supine on the fluoroscopy table. The external portion of the existing percutaneous drainage catheter as well as the surrounding skin was prepped and draped in usual sterile fashion. A preprocedural spot fluoroscopic image was obtained of the existing percutaneous drainage catheter. A small amount of contrast was injected via the existing percutaneous drainage catheter and several fluoroscopic images were obtained in various obliquities. The external portion of the percutaneous drainage catheter was cut and cannulated with a short Amplatz wire. Under intermittent fluoroscopic guidance, the existing percutaneous drainage  catheter was exchanged for a new slightly smaller now 8 French percutaneous drainage catheter with end coiled and locked within the more peripheral aspect of the decompressed abscess cavity/biloma. Contrast injection confirmed appropriate position functionality of the percutaneous drainage catheter. The percutaneous drainage catheter was connected to a gravity bag and secured in place within interrupted suture and a StatLock device. A dressing was applied. The patient tolerated the procedure well without immediate postprocedural complication. FINDINGS: Preprocedural spot fluoroscopic image demonstrates unchanged positioning of the biloma drainage catheter with end coiled and locked overlying the expected location of the gallbladder fossa. An internal biliary stent overlies expected location of the CBD. An internal-external percutaneous biliary drainage catheter is unchanged in positioning with radiopaque marker located at the level of the central aspect of the right intrahepatic biliary tree. A feeding enteric tube tip is excluded from view, at least within the horizontal segment of the duodenum. There is an additional decompressive nasogastric tube with tip overlying the gastric fundus. Contrast injection of the biloma drainage catheter demonstrates brisk opacification of the decompressed abscess cavity/biloma to the cystic duct remnant which communicates with the common bile duct. After fluoroscopic guided exchange and downsizing, the slightly smaller 8 [place] all-purpose drainage catheter is positioned within the more lateral aspect of the biloma cavity. IMPRESSION: 1. Successful fluoroscopic guided exchange, downsizing and repositioning of now 8 [place] all-purpose drainage catheter with end located within the more peripheral aspect of the well-defined biloma cavity. 2. Persistent communication between the biloma cavity and the cystic duct remnant. PLAN: - Do not flush the biloma drainage catheter. - Recommend  maintaining diligent records regarding daily catheter output. Electronically Signed   By: Sandi Mariscal M.D.   On: 06/12/2020 16:16        Scheduled Meds: . Chlorhexidine Gluconate Cloth  6 each Topical Daily  . clonazePAM  0.25 mg Per Tube BID  . enoxaparin (LOVENOX) injection  40 mg Subcutaneous Q24H  . fentaNYL  1 patch Transdermal Q72H  . influenza vac split quadrivalent PF  0.5 mL Intramuscular Tomorrow-1000  . insulin aspart  0-20 Units Subcutaneous Q4H  . lidocaine  1 patch Transdermal Q24H  . mouth rinse  15 mL Mouth Rinse BID  . pantoprazole sodium  40 mg Per Tube BID  . sodium chloride flush  10-40 mL Intracatheter Q12H  . sodium chloride flush  5 mL Intracatheter Q8H  . sodium chloride flush  5 mL Intracatheter Q8H   Continuous Infusions: . sodium chloride 250 mL (06/12/20 0730)  . ampicillin-sulbactam (UNASYN) IV 3 g (06/14/20 0855)  . TPN ADULT (ION) 100 mL/hr at 06/13/20 1726  . TPN ADULT (ION)       LOS: 48 days     Georgette Shell, MD  06/14/2020, 1:24 PM

## 2020-06-14 NOTE — Progress Notes (Signed)
Palliative: Adam Fowler is lying quietly in bed.  He appears acutely/chronically ill and quite frail.  His abdomen is distended.  He will briefly open his eyes but not make eye contact.  There is no family at bedside at this time.  Called to wife, Adam Fowler.  Adam Fowler shares her concerns that she feels she is hearing conflicting information/prognosis from providers.   We talk about the reality of Adam Fowler's acute illness.  Line is disconnected.  I attempt to call back and leave VM.    Conference with attending, surgery, bedside nursing staff, related to patient condition, needs, goals of care conversations with wife.  Plan:  At this point, treat the treatable, but no CPR or intubation.         25 minutes  Adam Axe, NP Palliative medicine team Team phone [phone] Greater than 50% of this time was spent counseling and coordinating care related to the above assessment and plan

## 2020-06-14 NOTE — Progress Notes (Signed)
PHARMACY - TOTAL PARENTERAL NUTRITION CONSULT NOTE  Indication:  Severe pancreatitis, biliary fistula, GOO  Patient Measurements: Height: 5' 9"  (175.3 cm) Weight: 88.6 kg (195 lb 5.2 oz) IBW/kg (Calculated) : 70.7 TPN AdjBW (KG): 86.1 Body mass index is 28.84 kg/m.  Assessment:  31 YOM admitted 04/26/20, post-ERCP 8/18 and biliary stent exchange for persistent postoperative bile leak related to recent cholecystectomy with abscess s/p percutaneous drainage. Patient developed acute pancreatitis, has had a prolonged hospitalization complicated by VDRF (extubated 8/24), AKI, encephalopathy, and afib. TPN initiated 9/11 for acute post-ERCP pancreatitis/ileus and persistent biliary leak. Significant gastric fluid retention from functional outlet obstruction. Patient acutely decompensated 9/23 resulted in pressors, non-rebreather and septic shock.   Per surgery note on 10/4 - will have to be very cautious about enteral feed advancement and the patient will likely be TPN-dependent for weeks to months. Considering re-positioning of the drain to better allow for fistula closure.    Glucose / Insulin: A1c 5.6% - CBGs <180. Required 19 units rSSI + 25 units in TPN Electrolytes: No new labs on 10/7 - K 4 (goal >/= 4 for ileus), Cl 110. CO2 25. (max acetate in TPN), CoCa~10.5 (Ca removed from TPN since 10/2) Renal: SCr < 1, BUN 33 LFTs / TGs: LFTs WNL except alk phos down to 162, tbili WNL, TG 112 (10/4) Prealbumin / albumin: Prealbumin 9.2 (up). Albumin 1.5 Intake / Output; MIVF: UOP not charted yesterday, NG 573m, drain 3829m  LBM 10/2.    GI Imaging:  9/5: CT - severe pancreatitis 9/7: HIDA - persistent bile leak with accumulations/paracolic gutter 9/9: CT - pancreatitis (peripancreatic fat stranding and peripancreatic fluid, percutaneous drain at the gallbladder fossa, unchanged biliary stent with no intrahepatic biliary duct dilatation, bilateral pleural effusions 9/15: KUB - diffuse colonic  stool, no bowel obstruction/free air 9/18: CT - mild colonic diverticulosis, pseudocyst involvement and less likely abscess 9/23: KUB - no evidence of bowel obstruction/ileus 9/23: CT - Gastric ileus, dilated/fluid-filled esophagus, small volume abd ascites 9/26: KUB - Suspect ascites and diffuse body wall edema 10/1: CT - sequela of necrotic pancreatitis with extensive fluid collection, moderate ascites, mod b/l effusions, basilar ATX  Surgeries / Procedures: 8/18: ERCP/biliary stent exchange 9/9: Unsuccessful ERCP 9/10: Paracentesis - 1.4L out 9/21: EGD- Esophagitis w/ active oozing and stigmata of recent bleeding, gastritis w/ hemorrhage, congested duodenal mucosa; NGT trauma  9/21: Paracentesis/biliary drain placement/cholangiogram/drain check- 3L removed 9/30: GB fossa drain occluded, exchanged and upsized to 24F  Central access: PICC placed 05/19/20 TPN start date: 05/19/20  Nutritional Goals (per RD rec on 06/12/20): kCal: 2300-2500, Protein: 130-145g, Fluid: >2.3L/day  Current Nutrition: TPN - goal rate is 10095mr, providing 144g AA, 336g CHO and 67g ILE for a total of 2390 kCal  Plan:  Continue TPN at goal rate of 100m8m, providing 144g AA, 336g CHO and 67g ILE for a total of 2390 kCal, meeting 100% of needs  Electrolytes in TPN: Na 30mE11m K 45mEq79mMag 4mEq/L85mhos 7mmol/L73ma removed, max acetate  Add standard MVI and trace elements to TPN  Continue resistant SSI Q4H +  25 units regular insulin in TPN  F/u restart of TF and ability to titrate per surgery will likely need TPN for weeks to months  Thank you for allowing pharmacy to be a part of this patient's care.  Cathy PiAlanda Slim, FCCM [facility] Pharmacist Please see AMION for all Pharmacists' Contact Phone Numbers 06/14/2020, 7:33 AM

## 2020-06-15 ENCOUNTER — Encounter: Payer: Self-pay | Admitting: Gastroenterology

## 2020-06-15 LAB — GLUCOSE, CAPILLARY
Glucose-Capillary: 100 mg/dL — ABNORMAL HIGH (ref 70–99)
Glucose-Capillary: 123 mg/dL — ABNORMAL HIGH (ref 70–99)
Glucose-Capillary: 124 mg/dL — ABNORMAL HIGH (ref 70–99)
Glucose-Capillary: 127 mg/dL — ABNORMAL HIGH (ref 70–99)
Glucose-Capillary: 135 mg/dL — ABNORMAL HIGH (ref 70–99)

## 2020-06-15 MED ORDER — HALOPERIDOL 1 MG PO TABS: 0.5000 mg | ORAL_TABLET | ORAL | Status: DC | PRN

## 2020-06-15 MED ORDER — MORPHINE SULFATE (CONCENTRATE) 10 MG/0.5ML PO SOLN
2.6000 mg | ORAL | Status: DC | PRN
  Administered 2020-06-15: 2.6 mg via SUBLINGUAL
  Administered 2020-06-15: 3 mg via SUBLINGUAL
  Filled 2020-06-15 (×2): qty 0.5

## 2020-06-15 MED ORDER — GLYCOPYRROLATE 1 MG PO TABS: 1.0000 mg | ORAL_TABLET | ORAL | Status: DC | PRN

## 2020-06-15 MED ORDER — FUROSEMIDE 10 MG/ML IJ SOLN: 40.0000 mg | Freq: Once | INTRAMUSCULAR | Status: DC

## 2020-06-15 MED ORDER — HALOPERIDOL LACTATE 2 MG/ML PO CONC
0.5000 mg | ORAL | Status: DC | PRN
  Filled 2020-06-15: qty 0.3

## 2020-06-15 MED ORDER — ONDANSETRON HCL 4 MG/2ML IJ SOLN: 4.0000 mg | Freq: Four times a day (QID) | INTRAMUSCULAR | Status: DC | PRN

## 2020-06-15 MED ORDER — BIOTENE DRY MOUTH MT LIQD: 15.0000 mL | OROMUCOSAL | Status: DC | PRN

## 2020-06-15 MED ORDER — LORAZEPAM 2 MG/ML PO CONC
1.0000 mg | ORAL | Status: DC | PRN
  Administered 2020-06-15: 2 mg via ORAL
  Administered 2020-06-15: 1 mg via ORAL
  Filled 2020-06-15 (×3): qty 1

## 2020-06-15 MED ORDER — HALOPERIDOL LACTATE 5 MG/ML IJ SOLN: 0.5000 mg | INTRAMUSCULAR | Status: DC | PRN

## 2020-06-15 MED ORDER — ACETAMINOPHEN 650 MG RE SUPP: 650.0000 mg | Freq: Four times a day (QID) | RECTAL | Status: DC | PRN

## 2020-06-15 MED ORDER — TRAVASOL 10 % IV SOLN
INTRAVENOUS | Status: DC
  Filled 2020-06-15: qty 1440

## 2020-06-15 MED ORDER — ACETAMINOPHEN 325 MG PO TABS: 650.0000 mg | ORAL_TABLET | Freq: Four times a day (QID) | ORAL | Status: DC | PRN

## 2020-06-15 MED ORDER — GLYCOPYRROLATE 0.2 MG/ML IJ SOLN: 0.2000 mg | INTRAMUSCULAR | Status: DC | PRN

## 2020-06-15 MED ORDER — ONDANSETRON 4 MG PO TBDP: 4.0000 mg | ORAL_TABLET | Freq: Four times a day (QID) | ORAL | Status: DC | PRN

## 2020-06-15 MED ORDER — HYDROMORPHONE HCL 2 MG PO TABS: 1.0000 mg | ORAL_TABLET | ORAL | 0 refills | Status: AC | PRN

## 2020-06-15 MED ORDER — POLYVINYL ALCOHOL 1.4 % OP SOLN
1.0000 [drp] | Freq: Four times a day (QID) | OPHTHALMIC | Status: DC | PRN
  Filled 2020-06-15: qty 15

## 2020-06-15 MED FILL — HYDROmorphone HCL 2 MG TABS: 2 | 2 days supply | Qty: 10 | Fill #0

## 2020-06-15 NOTE — Progress Notes (Addendum)
[facility] arrived to transport patient home for hospice care. AVS discharge paperwork given to transporter and patient was informed. Unfortunately, [facility] cannot take narcotics with transportation of patient. Patient has bottle of Dilaudid 2mg  sent from pharmacy to take home, so spouse and hospice nurse was notified of situation. Hospice nurse stated she is able to obtain a prescription from a 24 hour pharmacy and family can pick up dilaudid prescription tomorrow at [facility] nurses station. Patient's PIV removed and belongings taken by family previously in the day.

## 2020-06-15 NOTE — Progress Notes (Signed)
PHARMACY - TOTAL PARENTERAL NUTRITION CONSULT NOTE  Indication:  Severe pancreatitis, biliary fistula, GOO  Patient Measurements: Height: 5' 9"  (175.3 cm) Weight: 89.2 kg (196 lb 10.4 oz) IBW/kg (Calculated) : 70.7 TPN AdjBW (KG): 86.1 Body mass index is 29.04 kg/m.  Assessment:  89 YOM admitted 04/26/20, post-ERCP 8/18 and biliary stent exchange for persistent postoperative bile leak related to recent cholecystectomy with abscess s/p percutaneous drainage. Patient developed acute pancreatitis, has had a prolonged hospitalization complicated by VDRF (extubated 8/24), AKI, encephalopathy, and afib. TPN initiated 9/11 for acute post-ERCP pancreatitis/ileus and persistent biliary leak. Significant gastric fluid retention from functional outlet obstruction. Patient acutely decompensated 9/23 resulted in pressors, non-rebreather and septic shock.   Per surgery note on 10/4 - will have to be very cautious about enteral feed advancement and the patient will likely be TPN-dependent for weeks to months. Considering re-positioning of the drain to better allow for fistula closure.    Glucose / Insulin: A1c 5.6% - CBGs <180. Required 18 units rSSI + 25 units in TPN Electrolytes: No new labs on 10/8 - K 4 (goal >/= 4 for ileus), Cl 110. CO2 25. (max acetate in TPN), CoCa~10.5 (Ca removed from TPN since 10/2) Renal: SCr < 1, BUN 33 LFTs / TGs: LFTs WNL except alk phos down to 162, tbili WNL, TG 112 (10/4) Prealbumin / albumin: Prealbumin 9.2 (up). Albumin 1.5 Intake / Output; MIVF: UOP not charted yesterday, NG 518m, drain 3822m  LBM 10/2.    GI Imaging:  9/5: CT - severe pancreatitis 9/7: HIDA - persistent bile leak with accumulations/paracolic gutter 9/9: CT - pancreatitis (peripancreatic fat stranding and peripancreatic fluid, percutaneous drain at the gallbladder fossa, unchanged biliary stent with no intrahepatic biliary duct dilatation, bilateral pleural effusions 9/15: KUB - diffuse colonic  stool, no bowel obstruction/free air 9/18: CT - mild colonic diverticulosis, pseudocyst involvement and less likely abscess 9/23: KUB - no evidence of bowel obstruction/ileus 9/23: CT - Gastric ileus, dilated/fluid-filled esophagus, small volume abd ascites 9/26: KUB - Suspect ascites and diffuse body wall edema 10/1: CT - sequela of necrotic pancreatitis with extensive fluid collection, moderate ascites, mod b/l effusions, basilar ATX  Surgeries / Procedures: 8/18: ERCP/biliary stent exchange 9/9: Unsuccessful ERCP 9/10: Paracentesis - 1.4L out 9/21: EGD- Esophagitis w/ active oozing and stigmata of recent bleeding, gastritis w/ hemorrhage, congested duodenal mucosa; NGT trauma  9/21: Paracentesis/biliary drain placement/cholangiogram/drain check- 3L removed 9/30: GB fossa drain occluded, exchanged and upsized to 61F  Central access: PICC placed 05/19/20 TPN start date: 05/19/20  Nutritional Goals (per RD rec on 06/12/20): kCal: 2300-2500, Protein: 130-145g, Fluid: >2.3L/day  Current Nutrition: TPN - goal rate is 10076mr, providing 144g AA, 336g CHO and 67g ILE for a total of 2390 kCal  Plan:  Continue TPN at goal rate of 100m81m, providing 144g AA, 336g CHO and 67g ILE for a total of 2390 kCal, meeting 100% of needs  Electrolytes in TPN: Na 30mE66m K 45mEq6mMag 4mEq/L6mhos 7mmol/L80ma removed, max acetate  Add standard MVI and trace elements to TPN  Continue resistant SSI Q4H +  25 units regular insulin in TPN  F/u restart of TF and ability to titrate per surgery will likely need TPN for weeks to months Will order labs for Saturday am  Thank you for allowing pharmacy to be a part of this patient's care.  Cathy PiAlanda Slim, FCCM [facility] Pharmacist Please see AMION for all Pharmacists' Contact Phone Numbers 06/15/2020, 7:14 AM

## 2020-06-15 NOTE — Progress Notes (Signed)
[facility] care and interdisciplinary team with pt's wife Jeani Hawking. She agrees to the comfort care approach and does want the pt to go home with hospice services. She insist that he go home today.  I have ordered equipment to be delivered ASAP- hospital bed, oxygen and suction machine. Once equipment in home will proceed with discharge plan and pt will need to go home by ambulance. I have requested pt go home with some pain medication until hospice can get in and get him set up. It will likely be a late discharge and home care admission after 500pm today and there is concern pharmacies in the [place] area will be closed.   SW Alyse Low is aware and will update MD   Hospice will proceed to home tonight one he gets home for enrollment into hospice care.   Webb Silversmith RN [phone]

## 2020-06-15 NOTE — Progress Notes (Signed)
I was informed by the Inpatient GI team today that the family and patient have transitioned Adam Fowler goals of care to comfort. I appreciate all of the teams/providers who have been a part of this patient's care in effort of helping Adam Fowler.  GM

## 2020-06-15 NOTE — Discharge Summary (Signed)
Physician Discharge Summary  Adam Fowler MVH:[id number] DOB: Sep 07, 1956 DOA: 04/26/2020  PCP: Melony Overly, MD  Admit date: 04/26/2020 Discharge date: 06/15/2020  Admitted From: Home Disposition: Home Recommendations for Outpatient Follow-up:  Hospice discharge  Home Health: None Equipment/Devices oxygen Discharge Condition hospice CODE STATUS DNR Diet recommendation: Comfort food   brief/Interim Summary:64 yr old man with a past medical history significant for HTN, insomnia, and renal lithiasis. Marland Kitchen He had undergone a cholecystectomy with placement of JP drain on 01/24/2020. On 02/02/2020 he was found to have a fluid collection in the the gallbladder fossa. On 6/7 this collection had developed bubbles and a second collection has formed along the right edge of the liver. At this time previous drains were removed and new drain was placed by IR. CBD stent was placed via ERCP on 03/21/2020. He next presented on 04/25/2020 for repeat ERCP after biliary leak following recent cholecystectomy with abscess after percutaneous drainage. The patient had complained of severe abdominal pain on this presentation. He was admitted with post ERCP pancreatitis. He decompensated 8/21/-24 with respiratory failure due to bilateral pleural effusions, atrial fibrillation with RVR. He required mechanical ventilation for volume overload and aspiration pneumonia. Another ERCP was attempted on 05/17/2020. This was not successful due to GOO. The patient was started on TPN on 9/10. On 05/31/2020 the patient had a decompensation with abdominal pain and hypotension, and he was transferred back to the ICU. Despite TPN and ongoing therapies the patient has become quite weak and debilited.  The patient was transferred out of the ICU to the care of the hospitalists service on 06/11/2020 by PCCM. The patient continues to require TPN and will require it for months to come per surgery and gastroenterology. 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 The drain is within the duodenum appropriately and is functioning, although he continues to have persistent leakage. There is a months old well-defined tract now. Dr Zenia Resides discussed with IR whether repositioning the drain further down the GI tract would allow the fistula to close. Dr. Pascal Lux did not feel that the patient would benefit from any further intervention at this time. Gastroenterology does not have any further plans for additional endoscopy now in light of the GOO. The SMV is nearly occluded. Supportive care is recommended.  Discharge Diagnoses:  Principal Problem:   Post-ERCP acute pancreatitis Active Problems:   Severe sepsis (HCC)   Polycythemia   AKI (acute kidney injury) ([place])   Metabolic acidosis   Acute respiratory failure with hypoxia (HCC)   Bilateral pleural effusion   Ileus (HCC)   Bile duct leak   Gastric outlet obstruction   Abdominal distention   ARDS (adult respiratory distress syndrome) ([place])   Biloma following surgery   Acute blood loss anemia   Palliative care encounter   Chest pain   Acute cholecystitis   Encephalopathy acute   Goals of care, counseling/discussion     Persistent bile leak after cholecystectomy on 01/24/20 with 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 Patient had a prolonged hospital stay of 49 days.  He was admitted on 04/27/2020 and discharged on 06/15/2020 with home hospice.  Patient had percutaneous biliary drain placed on 05/29/2020.  Patient was in ICU decompensated was given IV fluids and pressors for hypotension which was thought to be related to sepsis from peritonitis.  Patient was treated with TPN and enteric tube  feedings.  His overall condition did not improve with any treatments.  He was treated with IV Unasyn.  He expressed a desire to go home and be  with family.  Discussed in detail with palliative care and patient's wife.  His wife requested that patient be sent home with hospice.   Hypoxia patient became hypoxic overnight to 80s and was placed on oxygen.    Given Lasix.  Severe protein calorie malnutrition: Was on tube feeds which is stopped at the time of discharge.  Patient will be discharged home with hospice.    Peritonitis with Enterococcal and Klebsiella bacteremia: He was treated with Unasyn.  He was followed by infectious disease team.  Gastric outlet obstruction from peripancreatic fluid collections     Nutrition Problem: Inadequate oral intake Etiology: acute illness    Signs/Symptoms: NPO status     Interventions: TPN  Estimated body mass index is 29.04 kg/m as calculated from the following:   Height as of this encounter: 5\' 9"  (1.753 m).   Weight as of this encounter: 89.2 kg.  Discharge Instructions  Discharge Instructions    Diet - low sodium heart healthy   Complete by: As directed    Increase activity slowly   Complete by: As directed    No wound care   Complete by: As directed      Allergies as of 06/15/2020      Reactions   Toradol [ketorolac Tromethamine] Other (See Comments)   delirium      Medication List    STOP taking these medications   CALCIUM+D3 PO   CoQ10 400 MG Caps   GLUCOSAMINE-CHONDROITIN PO   ibuprofen 200 MG tablet Commonly known as: ADVIL   Krill Oil 1000 MG Caps   lisinopril-hydrochlorothiazide 10-12.5 MG tablet Commonly known as: ZESTORETIC   MORINGA PO   multivitamin with minerals Tabs tablet   ondansetron 4 MG tablet Commonly known as: ZOFRAN   OVER THE COUNTER MEDICATION   oxyCODONE 5 MG immediate release tablet Commonly known as: Oxy IR/ROXICODONE   pravastatin 80 MG tablet Commonly known as: PRAVACHOL   Red Yeast Rice 600 MG Caps   sertraline 100 MG tablet Commonly known as: ZOLOFT   TURMERIC PO   Vitamin B-12 5000 MCG Tbdp    zolpidem 5 MG tablet Commonly known as: AMBIEN       Allergies  Allergen Reactions  . Toradol [Ketorolac Tromethamine] Other (See Comments)    delirium    Consultations: PCCM, IR, general surgery  Procedures/Studies: DG Chest 1 View  Result Date: 06/13/2020 CLINICAL DATA:  Abdominal distension. EXAM: CHEST  1 VIEW COMPARISON:  Chest radiograph 06/01/2020 FINDINGS: Right upper extremity PICC tip the atrial caval junction. Enteric tube is well as weighted feeding tube in place. Drainage catheter in the right upper quadrant partially included. Similar cardiomegaly. Low lung volumes. Stable retrocardiac opacity. Bronchovascular crowding versus vascular congestion, with ill-defined bilateral lung opacities. Minimal fluid in the right minor fissure, with suspected sub pulmonic effusion. No visualized pneumothorax. IMPRESSION: 1. Low lung volumes. Stable retrocardiac opacity, likely combination of pleural effusions and atelectasis/airspace disease. 2. Bronchovascular crowding versus vascular congestion. Suspected right sub pulmonic effusion with minimal fluid in the right minor fissure. 3. Stable cardiomegaly. Electronically Signed   By: Keith Rake M.D.   On: 06/13/2020 18:51   DG Chest 1 View  Result Date: 05/21/2020 CLINICAL DATA:  Dyspnea. EXAM: CHEST  1 VIEW COMPARISON:  CT abdomen pelvis dated May 17, 2020. Chest x-ray dated May 03, 2020. FINDINGS: Enteric tube in the stomach. Right-sided PICC line with tip at the cavoatrial junction. The heart size and mediastinal contours are within normal limits. Left greater than right bibasilar opacities persist. Unchanged small left pleural effusion. No pneumothorax. Unchanged cholecystostomy tube in the right upper quadrant. No acute osseous abnormality. IMPRESSION: 1. Unchanged bibasilar pneumonia and small left pleural effusion. Electronically Signed   By: Titus Dubin M.D.   On: 05/21/2020 14:04   DG Lumbar Spine 2-3 Views  Result  Date: 05/25/2020 CLINICAL DATA:  Ongoing back pain. EXAM: LUMBAR SPINE - 2-3 VIEW COMPARISON:  05/02/2020 FINDINGS: Normal alignment. Negative for fracture or mass. No bone lesion identified. Mild disc degeneration with mild anterior spurring L2-3 and L3-4. Biliary stent noted.  Pigtail catheter right upper quadrant. IMPRESSION: Mild lumbar degenerative change.  No acute abnormality. Electronically Signed   By: Franchot Gallo M.D.   On: 05/25/2020 09:34   DG Abd 1 View  Result Date: 06/13/2020 CLINICAL DATA:  Abdominal distension. EXAM: ABDOMEN - 1 VIEW COMPARISON:  CT 06/08/2020 FINDINGS: Enteric tube in place tip in the stomach. Weighted feeding tube in the region of the proximal jejunum. Biliary stent with biliary catheter extending into the duodenum. There is an additional right upper quadrant drain. Generalized paucity of bowel gas. No bowel dilatation to suggest obstruction. Small volume of formed stool in the ascending, transverse, and rectosigmoid colon. IMPRESSION: 1. Paucity of bowel gas without bowel dilatation to suggest obstruction. 2. Enteric tube in place tip in the stomach. Weighted feeding tube in the region of the proximal jejunum. 3. Biliary stent and biliary catheter in place. Additional right upper quadrant drain. Electronically Signed   By: Keith Rake M.D.   On: 06/13/2020 18:53   DG Abd 1 View  Result Date: 06/06/2020 CLINICAL DATA:  Check feeding catheter placement EXAM: ABDOMEN - 1 VIEW COMPARISON:  Film from earlier in the same day. FINDINGS: Gastric catheter has been removed. New weighted feeding catheter has been placed and extends into the fourth portion of the duodenum near the proximal jejunum. Previous cholecystostomy tube and biliary drain are again seen and stable. No obstructive changes are seen. IMPRESSION: Feeding tube in satisfactory position in the distal duodenum. Electronically Signed   By: Inez Catalina M.D.   On: 06/06/2020 12:39   DG Abd 1 View  Result  Date: 05/31/2020 CLINICAL DATA:  Check gastric catheter placement EXAM: ABDOMEN - 1 VIEW COMPARISON:  CT from earlier in the same day. FINDINGS: Scattered large and small bowel gas is noted. Cholecystostomy tube as well as internal external biliary drain is noted with a CBD stent in place. Gastric catheter is coiled within the stomach. No other focal abnormality is noted. IMPRESSION: Gastric catheter coiled within the stomach. Electronically Signed   By: Inez Catalina M.D.   On: 05/31/2020 20:42   DG Abd 1 View  Result Date: 05/31/2020 CLINICAL DATA:  Abdominal distension. EXAM: ABDOMEN - 1 VIEW COMPARISON:  May 27, 2020. FINDINGS: The bowel gas pattern is normal. Internal external biliary drain is noted passing through biliary stent, with distal tip in expected position of the distal duodenum. IMPRESSION: No evidence of bowel obstruction or ileus. Internal external biliary drain is noted. Electronically Signed   By: Marijo Conception M.D.   On: 05/31/2020 14:44   DG Abd 1 View  Result Date: 05/23/2020 CLINICAL DATA:  Abdominal pain EXAM: ABDOMEN - 1 VIEW COMPARISON:  May 16, 2020; CT abdomen and pelvis May 17, 2020 FINDINGS: Pigtail catheter remains on the right laterally. Biliary stent in place. There is moderate air in the stomach without gross gastric distention. There is diffuse stool in the colon. There is no small dilatation. No air-fluid level. No free air evident on supine examination. IMPRESSION: Pigtail catheter on the right laterally. Biliary stent in place. Diffuse stool in colon. No bowel obstruction or free air. Electronically Signed   By: Lowella Grip III M.D.   On: 05/23/2020 10:18   DG Abd 1 View  Result Date: 05/16/2020 CLINICAL DATA:  Abdominal distension with nausea and vomiting EXAM: ABDOMEN - 1 VIEW COMPARISON:  May 11, 2020 abdominal radiograph and CT abdomen and pelvis May 13, 2020. FINDINGS: Biliary stent appears unchanged in position. Pigtail catheter  in periphery of right upper abdomen. There is moderate stool in the colon. There is no bowel dilatation or air-fluid level to suggest bowel obstruction. No free air. Feeding tube no longer present. IMPRESSION: Pigtail catheter right upper abdomen peripherally. Stable appearance of biliary stent on supine image. Moderate stool in colon. No bowel obstruction or free air evident. Electronically Signed   By: Lowella Grip III M.D.   On: 05/16/2020 13:58   CT ABDOMEN PELVIS W CONTRAST  Result Date: 06/08/2020 CLINICAL DATA:  Acute, severe pancreatitis, evaluate fluid collections EXAM: CT ABDOMEN AND PELVIS WITH CONTRAST TECHNIQUE: Multidetector CT imaging of the abdomen and pelvis was performed using the standard protocol following bolus administration of intravenous contrast. CONTRAST:  161mL OMNIPAQUE IOHEXOL 300 MG/ML  SOLN COMPARISON:  05/31/2020 FINDINGS: Lower chest: Moderate bilateral pleural effusion, progressed on the right. Left more than right lower lobe collapse. Hepatobiliary: Right-sided percutaneous biliary drain traversing a CBD stent, tip at the distal duodenum. Cholecystostomy tube with collapsed gallbladder. Stable appearanceof the liver. Pancreas: Sequela of pancreatitis with organized fluid collection extending from the gastrosplenic ligament inferiorly and encircling the pancreas. There has been a diffuse decrease in size of the collection, although the extent is unchanged. For example, in the midline at the level of the gastric body the collection measures up to 3 cm in thickness as compared to 4.7 cm previously. Degree of fat stranding in the upper abdomen is unchanged. Vague high-density areas at the level of the pancreatic head presumably areas of previously administered contrast and areas of non necrotic parenchyma. Spleen: Unremarkable. Adrenals/Urinary Tract: Negative adrenals. No hydronephrosis or stone. Unremarkable bladder. Stomach/Bowel: No obstruction. There is percutaneous  gastrostomy tube and post pyloric feeding tube which are in unremarkable position. The stomach is less distended today with diffuse wall thickening from submucosal edema. Administered enteric contrast reaches the distal colon. Vascular/Lymphatic: IVC narrowing due to the retroperitoneal inflammation, unchanged. Narrowing of the main portal vein due to the inflammation, but patent and unchanged. Enlarged small bowel mesenteric lymph nodes and retroperitoneal lymph nodes, considered reactive. Reproductive:No pathologic findings. Other: Moderate ascites without focal loculation.  Body wall edema. Musculoskeletal: No acute abnormalities. IMPRESSION: 1. Sequela of necrotic pancreatitis with extensive fluid collection. The collections are unchanged in extent and have contracted with matured organization. 2. Moderate ascites. 3. Moderate bilateral pleural effusion, progressed on the right. 4. Bilateral lower lobe atelectasis. Electronically Signed   By: Monte Fantasia M.D.   On: 06/08/2020 05:03   CT ABDOMEN PELVIS W CONTRAST  Result Date: 05/31/2020 CLINICAL DATA:  Pancreatitis EXAM: CT ABDOMEN AND PELVIS WITH CONTRAST TECHNIQUE: Multidetector CT imaging of the abdomen and pelvis was performed using the standard protocol following bolus administration of intravenous contrast. CONTRAST:  137mL OMNIPAQUE IOHEXOL 300 MG/ML  SOLN COMPARISON:  05/26/2020 FINDINGS: Lower chest: There is a moderate-sized left-sided pleural effusion that is only partially visualized. There is at least partial collapse of the left lower lobe. There are new coarse airspace opacities in the lingula and right lung base concerning infiltrates or aspiration. There is a small right-sided pleural effusion. The Ace off a Gus is dilated and fluid-filled. Hepatobiliary: There is an internal external biliary drain coursing through the patient's right hepatic lobe. The drain appears to be well position. Again noted is a percutaneous cholecystostomy tube.  The gallbladder is decompressed.A common bile duct stent is again noted. There is minimal intrahepatic biliary ductal dilatation. Pancreas: Again noted are findings of acute pancreatitis. There are persistent peripancreatic fluid collections which have changed minimally in size since the prior study. 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 Spleen: Unremarkable. Adrenals/Urinary Tract: --Adrenal glands: Unremarkable. --Right kidney/ureter: Are nonobstructing right-sided nephroliths. --Left kidney/ureter: There is a nonobstructing stone in the upper pole the left kidney. --Urinary bladder: The urinary bladder is decompressed and therefore is poorly evaluated. Stomach/Bowel: --Stomach/Duodenum: The stomach is significantly distended and contains an air-fluid level. There is asymmetric wall thickening of the lesser curvature of the stomach, likely reactive. --Small bowel: Unremarkable. --Colon: There are few scattered colonic diverticula without CT evidence for diverticulitis. There is some wall thickening of the splenic and hepatic flexures of the colon, presumably reactive in etiology. --Appendix: Normal. Vascular/Lymphatic: Atherosclerotic calcification is present within the non-aneurysmal abdominal aorta, without hemodynamically significant stenosis. The splenic artery appears to be patent. There appears to be moderate attenuation of the splenic vein and portal venous confluence as well as the proximal main portal vein. --No retroperitoneal lymphadenopathy. --No mesenteric lymphadenopathy. --No pelvic or inguinal lymphadenopathy. Reproductive: Unremarkable Other: Again noted is a small to moderate volume of abdominal ascites. There is body wall edema, slightly progressed from prior study. Musculoskeletal. No acute displaced fractures. IMPRESSION: 1. Again noted are findings of acute pancreatitis with relatively stable peripancreatic fluid collections. 2.  Findings consistent with a gastric ileus. The esophagus is dilated and fluid-filled to at least the mid thorax. This patient would likely benefit from NG tube decompression. 3. Worsening airspace opacities at the lung bases concerning for developing infiltrates or aspiration. 4. Moderate-sized left-sided pleural effusion with at least partial collapse of the left lower lobe. There is a trace to small right-sided pleural effusion with mild adjacent atelectasis. 5. Stable small volume abdominal ascites. 6. Well-positioned internal/external biliary drain. Unchanged cholecystostomy tube. Aortic Atherosclerosis (ICD10-I70.0). Electronically Signed   By: Constance Holster M.D.   On: 05/31/2020 16:04   CT ABDOMEN PELVIS W CONTRAST  Result Date: 05/26/2020 CLINICAL DATA:  Low back pain.  Possible acute pancreatitis. EXAM: CT ABDOMEN AND PELVIS WITH CONTRAST TECHNIQUE: Multidetector CT imaging of the abdomen and pelvis was performed using the standard protocol following bolus administration of intravenous contrast. CONTRAST:  175mL OMNIPAQUE IOHEXOL 300 MG/ML  SOLN COMPARISON:  05/17/2020 FINDINGS: Lower chest: Persistent bibasilar opacification with slight interval improvement which may be due to improving atelectasis or infection. Small to moderate size left pleural effusion without significant change. Central venous catheter tip seen over the SVC. Fluid is present over the distal esophagus which may be due to reflux. Hepatobiliary: Previous cholecystectomy. Pigtail drainage catheter tip in the region of the gallbladder fossa unchanged. Liver is otherwise unremarkable. Common bile duct stent unchanged. Pancreas: Stable moderate peripancreatic fluid extending into the gastrohepatic ligament  unchanged. This fluid is fairly well-defined and could represent involving suggest cysts and less likely abscess. Spleen: Normal. Adrenals/Urinary Tract: Adrenal glands are normal. Kidneys are normal in size without hydronephrosis.  Suggestion of minimal bilateral nephrolithiasis. Ureters and bladder are normal. Stomach/Bowel: Somewhat fluid distended stomach. Small bowel is unremarkable. Appendix is within normal. Mild diverticulosis of the colon. Vascular/Lymphatic: Minimal calcified plaque over the abdominal aorta. Aorta is normal in caliber. No adenopathy. Reproductive: Normal. Other: Moderate ascites unchanged. Mild diffuse subcutaneous edema unchanged. Musculoskeletal: No change. IMPRESSION: 1. Stable moderate peripancreatic fluid extending into the gastrohepatic ligament unchanged. This fluid is fairly organized and could represent involving pseudocysts and less likely abscess. Pigtail drainage catheter tip in the region of the gallbladder fossa unchanged. Common bile duct stent unchanged. 2. Persistent bibasilar opacification with slight interval improvement which may be due to improving atelectasis or infection. Stable small to moderate size left pleural effusion. 3. Moderate ascites unchanged. 4. Mild colonic diverticulosis. 5. Aortic atherosclerosis. Minimal bilateral nephrolithiasis. Aortic Atherosclerosis (ICD10-I70.0). Electronically Signed   By: Marin Olp M.D.   On: 05/26/2020 13:31   CT ABDOMEN PELVIS W CONTRAST  Result Date: 05/17/2020 CLINICAL DATA:  Follow-up pancreatitis. EXAM: CT ABDOMEN AND PELVIS WITH CONTRAST TECHNIQUE: Multidetector CT imaging of the abdomen and pelvis was performed using the standard protocol following bolus administration of intravenous contrast. CONTRAST:  177mL OMNIPAQUE IOHEXOL 300 MG/ML  SOLN COMPARISON:  CT 4 days ago 05/13/2020, additional priors reviewed. FINDINGS: Lower chest: Similar left pleural effusion from prior exam, compressive atelectasis. Previous right pleural effusion has improved. There are worsening patchy opacities within the lingula and both lower lobes, many of which are ground-glass. Hepatobiliary: Mild hepatic steatosis. Stable caudate cyst. Percutaneous drainage  catheter in the gallbladder fossa unchanged in position. Trace adjacent fluid. There is a biliary stent in place. Small amount of pneumobilia in the liver. No intrahepatic biliary ductal dilatation. Pancreas: Redemonstrated sequela of pancreatitis with extensive peripancreatic fat stranding and peripancreatic fluid. Pancreatic fluid appears organizing, with some areas of peripheral enhancement anteriorly. The degree of peripancreatic fluid similar to prior exam. There is no evidence of pancreatic air. Mild atrophy of the pancreatic head. No pancreatic ductal dilatation. Spleen: Prominent size spanning 13.9 cm cranial caudal. No focal abnormality. Adrenals/Urinary Tract: Normal adrenal glands. No hydronephrosis or perinephric edema. Homogeneous renal enhancement with symmetric excretion on delayed phase imaging. Nonobstructing stones in both kidneys. Minimal cortical scarring in the upper left kidney. Urinary bladder is partially distended without wall thickening. Stomach/Bowel: Enteric tube within the stomach which is distended with fluid and air. There is mild gastric wall thickening which may be in part reactive. Normal positioning of the duodenum and ligament of Treitz. No small bowel obstruction or obvious inflammation, allowing for presence of intra-abdominal ascites. Normal contrast filled appendix. Contrast throughout the colon with multifocal colonic diverticulosis. No discrete diverticulitis. Improved transverse colonic wall thickening from prior. Vascular/Lymphatic: Peripancreatic fluid causes mass effect on the peripheral portal vein, portal splenic confluence and upper aspect of the superior mesenteric vein, however no evidence of thrombus or occlusion. Vessels are narrowed but patent. The intrahepatic portal veins are patent. Abdominal aorta is normal in caliber with mild atherosclerosis. Prominent upper abdominal lymph nodes are likely reactive and stable from prior exam. No progressive adenopathy.  Reproductive: Normal sized prostate gland spanning 3.9 cm transverse. Other: Moderate volume of abdominopelvic ascites, not significantly changed from prior exam. Peripancreatic fluid appears slightly organizing with some areas of peripheral enhancement. No internal air. Generalized edema  of the intra-abdominal and omental fat. Mild generalized subcutaneous body wall edema. There is no free intra-abdominal air. Musculoskeletal: There are no acute or suspicious osseous abnormalities. Degenerative change in the spine. IMPRESSION: 1. Redemonstrated sequela of pancreatitis with extensive peripancreatic fat stranding and peripancreatic fluid. Pancreatic fluid appears slightly organizing with some areas of peripheral enhancement anteriorly since the prior exam. The degree of peripancreatic fluid is similar to prior. 2. Percutaneous drainage catheter in the gallbladder fossa unchanged with minimal adjacent fluid. Unchanged position of biliary stent. No intrahepatic biliary ductal dilatation. 3. Moderate volume of abdominopelvic ascites, not significantly changed from prior exam. 4. Improved transverse colonic wall thickening from prior exam. Colonic diverticulosis without diverticulitis. 5. Bilateral nonobstructing renal stones. 6. Improving right pleural effusion, stable left pleural effusion. There are increasing patchy and ground-glass opacities within the lung bases that is suspicious for infection, included COVID-19. Aortic Atherosclerosis (ICD10-I70.0). Electronically Signed   By: Keith Rake M.D.   On: 05/17/2020 22:30   IR Catheter Tube Change  Result Date: 06/12/2020 CLINICAL DATA:  History of cholecystitis complicated by postoperative leak from the cystic duct remnant requiring percutaneous drainage catheter placement on 02/13/2020. Patient subsequent underwent ERCP with internal biliary stent placement however this procedure was complicated by development of pancreatitis. Patient has since undergone  internal/external right-sided biliary drainage catheter placement on 05/30/2020 with subsequent biliary drain exchange and up sizing on 06/07/2020 Patient now returns for biloma drainage catheter injection, repositioning and downsizing at the request of consulting hepatic biliary surgeon, Dr. Zenia Resides. EXAM: FLUOROSCOPIC GUIDED BILOMA DRAINAGE CATHETER EXCHANGE, DOWNSIZING AND REPOSITIONING COMPARISON:  Percutaneous biliary drainage catheter exchange and up sizing-06/07/2020; CT abdomen and pelvis-06/08/2020 CONTRAST:  10 cc Omnipaque 300 - administered via the percutaneous drainage catheter. MEDICATIONS: None. ANESTHESIA/SEDATION: Moderate (conscious) sedation was employed during this procedure. A total of Versed 0.5 mg and Fentanyl 25 mcg was administered intravenously. Moderate Sedation Time: 10 minutes. The patient's level of consciousness and vital signs were monitored continuously by radiology nursing throughout the procedure under my direct supervision. FLUOROSCOPY TIME:  42 seconds (10 mGy) TECHNIQUE: Patient was positioned supine on the fluoroscopy table. The external portion of the existing percutaneous drainage catheter as well as the surrounding skin was prepped and draped in usual sterile fashion. A preprocedural spot fluoroscopic image was obtained of the existing percutaneous drainage catheter. A small amount of contrast was injected via the existing percutaneous drainage catheter and several fluoroscopic images were obtained in various obliquities. The external portion of the percutaneous drainage catheter was cut and cannulated with a short Amplatz wire. Under intermittent fluoroscopic guidance, the existing percutaneous drainage catheter was exchanged for a new slightly smaller now 8 French percutaneous drainage catheter with end coiled and locked within the more peripheral aspect of the decompressed abscess cavity/biloma. Contrast injection confirmed appropriate position functionality of the  percutaneous drainage catheter. The percutaneous drainage catheter was connected to a gravity bag and secured in place within interrupted suture and a StatLock device. A dressing was applied. The patient tolerated the procedure well without immediate postprocedural complication. FINDINGS: Preprocedural spot fluoroscopic image demonstrates unchanged positioning of the biloma drainage catheter with end coiled and locked overlying the expected location of the gallbladder fossa. An internal biliary stent overlies expected location of the CBD. An internal-external percutaneous biliary drainage catheter is unchanged in positioning with radiopaque marker located at the level of the central aspect of the right intrahepatic biliary tree. A feeding enteric tube tip is excluded from view, at least within the  horizontal segment of the duodenum. There is an additional decompressive nasogastric tube with tip overlying the gastric fundus. Contrast injection of the biloma drainage catheter demonstrates brisk opacification of the decompressed abscess cavity/biloma to the cystic duct remnant which communicates with the common bile duct. After fluoroscopic guided exchange and downsizing, the slightly smaller 8 [place] all-purpose drainage catheter is positioned within the more lateral aspect of the biloma cavity. IMPRESSION: 1. Successful fluoroscopic guided exchange, downsizing and repositioning of now 8 [place] all-purpose drainage catheter with end located within the more peripheral aspect of the well-defined biloma cavity. 2. Persistent communication between the biloma cavity and the cystic duct remnant. PLAN: - Do not flush the biloma drainage catheter. - Recommend maintaining diligent records regarding daily catheter output. Electronically Signed   By: Sandi Mariscal M.D.   On: 06/12/2020 16:16   DG CHEST PORT 1 VIEW  Result Date: 06/01/2020 CLINICAL DATA:  Shortness of breath. EXAM: PORTABLE CHEST 1 VIEW FINDING: FINDING Mild  enlargement the cardiac silhouette, which is accentuated by low lung volumes and portable technique. Similar appearance of low lung volumes with left basilar opacity. Small left pleural effusion. No discernible pneumothorax. Gastric tube courses below the diaphragm, coiled in the stomach. Right-sided PICC with the tip projecting at the superior cavoatrial junction. External device overlying the left chest, possibly a suction catheter. IMPRESSION: Similar appearance of a small left pleural effusion with overlying opacity representing either atelectasis, aspiration, and/or pneumonia. Electronically Signed   By: Margaretha Sheffield MD   On: 06/01/2020 08:23   DG CHEST PORT 1 VIEW  Result Date: 05/31/2020 CLINICAL DATA:  Hypoxemia. EXAM: PORTABLE CHEST 1 VIEW COMPARISON:  May 27, 2020. FINDINGS: Stable cardiomediastinal silhouette. No pneumothorax is noted. Stable bibasilar opacities are noted concerning for atelectasis or infiltrates. Small left pleural effusion is noted. Right PICC line is unchanged. Bony thorax is unremarkable. IMPRESSION: Stable bibasilar atelectasis or infiltrates. Small left pleural effusion is noted. Electronically Signed   By: Marijo Conception M.D.   On: 05/31/2020 14:43   DG CHEST PORT 1 VIEW  Result Date: 05/27/2020 CLINICAL DATA:  Abdominal pain EXAM: PORTABLE CHEST 1 VIEW COMPARISON:  May 26, 2020, May 24, 2020 FINDINGS: The cardiomediastinal silhouette is unchanged in contour.RIGHT upper extremity PICC tip terminates over the superior cavoatrial junction. Percutaneous cholecystostomy tube. Biliary drain. Small LEFT pleural effusion. No pneumothorax. Persistent LEFT greater than RIGHT basilar heterogeneous opacities. Gaseous dilation of the stomach. No acute osseous abnormality. IMPRESSION: 1. Small LEFT pleural effusion. 2. Persistent LEFT greater than RIGHT basilar heterogeneous opacities. Differential considerations include atelectasis versus infection.  Electronically Signed   By: Valentino Saxon MD   On: 05/27/2020 09:24   DG CHEST PORT 1 VIEW  Result Date: 05/24/2020 CLINICAL DATA:  Shortness of breath EXAM: PORTABLE CHEST 1 VIEW COMPARISON:  05/21/2020 FINDINGS: Right PICC line remains in place, unchanged. Low lung volumes. Patchy bilateral airspace opacities, most pronounced in the left lower lobe, similar to prior study. Heart is normal size. Suspect small left effusion. No pneumothorax. IMPRESSION: Stable patchy bilateral airspace disease, left greater than right with low lung volumes. Suspect small left effusion. Electronically Signed   By: Rolm Baptise M.D.   On: 05/24/2020 09:46   DG ERCP  Result Date: 05/17/2020 CLINICAL DATA:  ERCP EXAM: ERCP TECHNIQUE: Multiple spot images obtained with the fluoroscopic device and submitted for interpretation post-procedure. FLUOROSCOPY TIME:  Fluoroscopy Time:  2 minutes and 22 seconds Radiation Exposure Index (if provided by the fluoroscopic device):  45.15 mGy Number of Acquired Spot Images: 6 COMPARISON:  CT dated 05/13/2020 FINDINGS: Initial images demonstrate a biliary stent in place in addition to a drain within the right upper quadrant. No injection of contrast was visualized. The images provided do not appear to demonstrate cannulation of the common bile duct. IMPRESSION: ERCP. Please see separate operative report for complete intraoperative details. These images were submitted for radiologic interpretation only. Please see the procedural report for the amount of contrast and the fluoroscopy time utilized. Electronically Signed   By: Constance Holster M.D.   On: 05/17/2020 15:30   DG Abd Portable 1V  Result Date: 06/07/2020 CLINICAL DATA:  Check gastric catheter placement EXAM: PORTABLE ABDOMEN - 1 VIEW COMPARISON:  06/06/2020 FINDINGS: Gastric catheter is now seen within the stomach. Feeding catheter extending into the fourth portion of the duodenum is noted. Cholecystostomy tube and biliary  drain are again noted and stable. No other focal abnormality is seen. IMPRESSION: New gastric catheter within the stomach as described. Electronically Signed   By: Inez Catalina M.D.   On: 06/07/2020 16:30   DG Abd Portable 1V  Result Date: 06/06/2020 CLINICAL DATA:  NG tube placement EXAM: PORTABLE ABDOMEN - 1 VIEW COMPARISON:  Radiograph 06/05/2020 FINDINGS: Transesophageal tube coiling in the left upper quadrant terminating in the region of the gastric body with side port distal to the GE junction. An internal/external biliary drain is again seen traversing a biliary stent as well as a pigtail percutaneous drain in the right upper quadrant. No high-grade obstructive bowel gas pattern. No suspicious calcifications within the abdomen. Persistent basilar atelectatic changes and small left pleural effusion are noted. Degenerative changes in the spine, hips and pelvis. IMPRESSION: 1. Transesophageal tube coiling in the left upper quadrant, terminating in the gastric body with side port distal to the GE junction. Electronically Signed   By: Lovena Le M.D.   On: 06/06/2020 00:56   DG Abd Portable 1V  Result Date: 06/05/2020 CLINICAL DATA:  NG tube advancement EXAM: PORTABLE ABDOMEN - 1 VIEW COMPARISON:  06/05/2020 FINDINGS: Enteric tube tip is advanced to the upper mid abdomen consistent with location in the distal stomach. Examination is otherwise unchanged. IMPRESSION: Enteric tube tip is in the distal stomach. Electronically Signed   By: Lucienne Capers M.D.   On: 06/05/2020 06:10   DG Abd Portable 1V  Result Date: 06/05/2020 CLINICAL DATA:  NG tube placement EXAM: PORTABLE ABDOMEN - 1 VIEW COMPARISON:  06/03/2020 FINDINGS: A catheter tip is visualized in the upper mediastinum, about 3.3 cm above the carina. It is unclear whether this is a normally placed endotracheal tube or a short enteric tube. Right PICC line with tip over the cavoatrial junction region. Biliary wall stent with apparent  percutaneous biliary drain. Pigtail catheter also in the right upper quadrant, possibly cholecystostomy. Paucity of gas in the upper abdomen. IMPRESSION: Catheter tip is in the upper mediastinum, about 3.3 cm above the carina. It is unclear whether this is a normally placed endotracheal tube or a short enteric tube. Electronically Signed   By: Lucienne Capers M.D.   On: 06/05/2020 05:44   DG Abd Portable 1V  Result Date: 06/04/2020 CLINICAL DATA:  OG tube placement EXAM: PORTABLE ABDOMEN - 1 VIEW COMPARISON:  Radiograph 06/02/2020, CT 05/31/2020 FINDINGS: Tip of the transesophageal tube terminating in the left upper quadrant with the side port beyond the GE junction. Biliary stent is in place traversed by an internal-external biliary drain. Terminus of a pigtail drain  is noted as well in the right upper quadrant. Telemetry leads overlie the abdomen. Left basilar effusion. Atelectatic changes in the right lung base. No high-grade obstructive bowel gas pattern. Diffuse mild body wall edema is noted. Suspect ascites as well. Osseous structures are free of acute abnormality with mild degenerative changes in the spine, hips and pelvis. IMPRESSION: 1. Tip of the transesophageal tube terminating in the left upper quadrant in the vicinity of the gastric body with the side port beyond the GE junction. 2. Biliary stent and internal-external biliary drain in place. 3. Suspect ascites and diffuse body wall edema. Electronically Signed   By: Lovena Le M.D.   On: 06/04/2020 00:14   DG Abd Portable 1V  Result Date: 06/02/2020 CLINICAL DATA:  Initial evaluation for NG tube placement. EXAM: PORTABLE ABDOMEN - 1 VIEW COMPARISON:  Prior radiograph from 06/01/2020. FINDINGS: Enteric tube in place with tip seen overlying the gastric body, side hole beyond the GE junction. Visualized bowel gas pattern within normal limits. Pigtail drainage catheter overlies the right upper quadrant. Additional surgical drain and biliary stent  remains in place. Persistent dense opacity at the left lung base, which could reflect a combination of effusion with atelectasis and/or consolidation. IMPRESSION: 1. Enteric tube in place with tip overlying the gastric body, side hole beyond the GE junction. 2. Persistent dense left basilar opacity, which could reflect a combination of effusion with atelectasis and/or consolidation. Electronically Signed   By: Jeannine Boga M.D.   On: 06/02/2020 03:50   DG Abd Portable 1V  Result Date: 06/01/2020 CLINICAL DATA:  Nasogastric tube placement. EXAM: PORTABLE ABDOMEN - 1 VIEW COMPARISON:  05/27/2020 FINDINGS: Nasogastric tube has been advanced, tip overlying the level of the distal stomach. Surgical drains and biliary stent in place. Bowel gas pattern is nonobstructive. There is residual contrast in the colon. There is opacity at the LEFT lung base consistent with consolidation and pleural effusion. IMPRESSION: Nasogastric tube tip to the level of the distal stomach. Electronically Signed   By: Nolon Nations M.D.   On: 06/01/2020 14:24   DG Abd Portable 1V  Result Date: 05/27/2020 CLINICAL DATA:  Nasogastric tube positioning. EXAM: PORTABLE ABDOMEN - 1 VIEW COMPARISON:  May 23, 2020 FINDINGS: Since the prior study there is been interval placement of a nasogastric tube, with its distal tip overlying the expected region of the body of the stomach. The bowel gas pattern is normal. Stable right-sided percutaneous pigtail catheter positioning is seen. A radiopaque stent is again seen overlying the right upper quadrant. No radio-opaque calculi or other significant radiographic abnormality are seen. IMPRESSION: Nasogastric tube positioning, as described above. Electronically Signed   By: Virgina Norfolk M.D.   On: 05/27/2020 22:04   DG C-Arm 1-60 Min-No Report  Result Date: 05/29/2020 Fluoroscopy was utilized by the requesting physician.  No radiographic interpretation.   IR CHOLANGIOGRAM  EXISTING TUBE  Result Date: 05/30/2020 INDICATION: 64 year old male with history of complex postoperative course after subtotal cholecystectomy for acute gangrenous cholecystitis in May 9417 complicated by biloma formation requiring percutaneous drainage on 02/13/2020 and placement of metallic common bile duct stent on 04/26/2020. Most recently, the patient has developed pancreatitis and duodenitis with ERCP findings consistent with obstruction of the distal common bile duct stent and subsequently high output from the biloma drain. Percutaneous cholangiogram and internal external biliary drain placement is requested for decompression and management of biliary fluid losses. EXAM: 1. Ultrasound-guided paracentesis 2. Cholangiogram via indwelling gallbladder fossa drain 3. Percutaneous cholangiogram  4. Placement of right-sided internal external biliary drain MEDICATIONS: Zosyn 3.375 g; The antibiotic was administered within an appropriate time frame prior to the initiation of the procedure. ANESTHESIA/SEDATION: Moderate (conscious) sedation was employed during this procedure. A total of Versed 2 mg and Fentanyl 75 mcg was administered intravenously. Moderate Sedation Time: 55 minutes. The patient's level of consciousness and vital signs were monitored continuously by radiology nursing throughout the procedure under my direct supervision. FLUOROSCOPY TIME:  Fluoroscopy Time: 17 minutes 18 seconds (81 mGy). COMPLICATIONS: None immediate. PROCEDURE: Informed written consent was obtained from the patient after a thorough discussion of the procedural risks, benefits and alternatives. All questions were addressed. Maximal Sterile Barrier Technique was utilized including caps, mask, sterile gowns, sterile gloves, sterile drape, hand hygiene and skin antiseptic. A timeout was performed prior to the initiation of the procedure. Preprocedure ultrasound demonstrated moderate to large volume ascites, including in the perihepatic  space. The largest fluid pocket visualized in the right lower quadrant. Therefore the right lower quadrant was prepped and draped in standard fashion. The puncture site was planned. Subdermal Local anesthesia was provided with 1% lidocaine with 1:100,000 epinephrine, followed by deeper local anesthetic to the level of the peritoneum under ultrasound guidance. A 5 [place] Yueh needle was advanced into the peritoneal fluid under ultrasound guidance. A total of 3 L of clear, straw-colored fluid was removed during the course of the procedure. The right upper quadrant and indwelling biloma drain were prepped and draped in standard fashion. Hand injection of contrast via the indwelling biloma drain demonstrated appropriate position within the gallbladder fossa as well as a patent cystic duct. Contrast was visualized within the metallic biliary stent and filled retrograde into the right, nondilated bile ducts. The distal common bile duct stent was occluded. A right posterior duct was identified is in adequate puncture site for biliary drain. A small amount of subdermal Local anesthesia was provided to the right mid axillary line at planned entry site as well as deeper along the hepatic capsule. Under fluoroscopic guidance with multiple obliquities, a 22 gauge Chiba needle was directed toward the opacified right posterior duct. The duct was punctured successfully, confirmed by gentle hand injection of contrast. A Nitrex wire was then directed into the right posterior duct through the metallic common bile duct stent and into the duodenum without difficulty. A 6 French coaxial introducer set was placed over the Nitrex wire. The inner cannula were removed and an Amplatz wire was directed adjacent to the Nitrex wire into the duodenum near the ligament of Treitz. Serial dilation was performed with 8 and 10 [place] dilators. A 10.2 [place] biliary drain with 10 cm of custom cut extra sideholes was then introduced with the pigtail  portion coiled in the fourth portion of the duodenum near the ligament of Treitz. Hand injection of contrast demonstrated opacification of the nondilated biliary tree which passed freely into the small bowel. The drain was affixed to the skin with 0 Prolene suture and a Stay Fix dressing. The patient tolerated the procedure well and was returned to the floor in stable condition. IMPRESSION: 1. Occluded indwelling metallic biliary stent at the distal end, presumed secondary to edema related to duodenitis/pancreatitis. 2. Indwelling biloma drain in satisfactory position. The cystic duct is patent. 3. Successful placement of an internal external biliary drain via right posterior bile ducts. The distal pigtail portion of the drain is positioned near the ligament of Treitz. 4. Ultrasound-guided paracentesis yielding 3 L of clear, straw-colored fluid. PLAN: Recommend  capping biloma drain once any post-procedural hemobilia has resolved. Recommend capping trial of biliary drain 12-24 hours after capping of biloma drain. IR will follow closely. Electronically Signed   By: Ruthann Cancer MD   On: 05/30/2020 08:22   IR CHOLANGIOGRAM EXISTING TUBE  Result Date: 05/19/2020 INDICATION: 64 year old male with a complex history of acute cholecystitis status post biliary leak following laparoscopic cholecystectomy status post placement of a biliary drainage catheter complicated by severe pancreatitis. Patient currently has a percutaneous drainage catheter in the gallbladder space and has also developed new mild ascites. His gallbladder fossa drain output had been fairly copious in there is prior imaging documentation of a fistulous communication with the cystic duct remanent. His drain output has suddenly slowed. He presents for drain injection with possible exchange/upsize if needed as well as ultrasound-guided paracentesis for diagnostic purposes. EXAM: 1. Drain injection 2. Ultrasound-guided paracentesis MEDICATIONS: The  patient is currently admitted to the hospital and receiving intravenous antibiotics. The antibiotics were administered within an appropriate time frame prior to the initiation of the procedure. ANESTHESIA/SEDATION: None. COMPLICATIONS: None immediate. PROCEDURE: Informed written consent was obtained from the patient after a thorough discussion of the procedural risks, benefits and alternatives. All questions were addressed. Maximal Sterile Barrier Technique was utilized including caps, mask, sterile gowns, sterile gloves, sterile drape, hand hygiene and skin antiseptic. A timeout was performed prior to the initiation of the procedure. Hand injection of contrast material was performed through the existing right upper quadrant drainage catheter. The drainage catheter is patent. The contrast material fills a collapsed space adjacent to the drainage catheter. There is partial filling of the cystic duct, but no contrast material enters the common bile duct. Attention was turned to the right lower quadrant. The abdomen was interrogated with ultrasound. There is small volume ascites. A suitable skin entry site was selected and marked. Local anesthesia was attained by infiltration with 1% lidocaine. A small dermatotomy was made. A 6 French Safe-T-Centesis catheter was then advanced through the abdominal wall into the ascites. Aspiration was performed yielding 1.4 L of bloody ascites. IMPRESSION: 1. Well-positioned and normally functioning right upper quadrant drainage catheter. The fistulous communication between the drainage catheter and the cystic duct appears to be nearly resolved. This likely explains the decreased output. No exchange or upsize necessary. 2. Successful ultrasound-guided diagnostic paracentesis yielding 1.4 L of bloody ascitic fluid. Samples were sent as requested. Electronically Signed   By: Jacqulynn Cadet M.D.   On: 05/19/2020 12:41   IR INT EXT BILIARY DRAIN WITH CHOLANGIOGRAM  Result Date:  05/30/2020 INDICATION: 64 year old male with history of complex postoperative course after subtotal cholecystectomy for acute gangrenous cholecystitis in May 5427 complicated by biloma formation requiring percutaneous drainage on 02/13/2020 and placement of metallic common bile duct stent on 04/26/2020. Most recently, the patient has developed pancreatitis and duodenitis with ERCP findings consistent with obstruction of the distal common bile duct stent and subsequently high output from the biloma drain. Percutaneous cholangiogram and internal external biliary drain placement is requested for decompression and management of biliary fluid losses. EXAM: 1. Ultrasound-guided paracentesis 2. Cholangiogram via indwelling gallbladder fossa drain 3. Percutaneous cholangiogram 4. Placement of right-sided internal external biliary drain MEDICATIONS: Zosyn 3.375 g; The antibiotic was administered within an appropriate time frame prior to the initiation of the procedure. ANESTHESIA/SEDATION: Moderate (conscious) sedation was employed during this procedure. A total of Versed 2 mg and Fentanyl 75 mcg was administered intravenously. Moderate Sedation Time: 55 minutes. The patient's level of consciousness  and vital signs were monitored continuously by radiology nursing throughout the procedure under my direct supervision. FLUOROSCOPY TIME:  Fluoroscopy Time: 17 minutes 18 seconds (81 mGy). COMPLICATIONS: None immediate. PROCEDURE: Informed written consent was obtained from the patient after a thorough discussion of the procedural risks, benefits and alternatives. All questions were addressed. Maximal Sterile Barrier Technique was utilized including caps, mask, sterile gowns, sterile gloves, sterile drape, hand hygiene and skin antiseptic. A timeout was performed prior to the initiation of the procedure. Preprocedure ultrasound demonstrated moderate to large volume ascites, including in the perihepatic space. The largest fluid  pocket visualized in the right lower quadrant. Therefore the right lower quadrant was prepped and draped in standard fashion. The puncture site was planned. Subdermal Local anesthesia was provided with 1% lidocaine with 1:100,000 epinephrine, followed by deeper local anesthetic to the level of the peritoneum under ultrasound guidance. A 5 [place] Yueh needle was advanced into the peritoneal fluid under ultrasound guidance. A total of 3 L of clear, straw-colored fluid was removed during the course of the procedure. The right upper quadrant and indwelling biloma drain were prepped and draped in standard fashion. Hand injection of contrast via the indwelling biloma drain demonstrated appropriate position within the gallbladder fossa as well as a patent cystic duct. Contrast was visualized within the metallic biliary stent and filled retrograde into the right, nondilated bile ducts. The distal common bile duct stent was occluded. A right posterior duct was identified is in adequate puncture site for biliary drain. A small amount of subdermal Local anesthesia was provided to the right mid axillary line at planned entry site as well as deeper along the hepatic capsule. Under fluoroscopic guidance with multiple obliquities, a 22 gauge Chiba needle was directed toward the opacified right posterior duct. The duct was punctured successfully, confirmed by gentle hand injection of contrast. A Nitrex wire was then directed into the right posterior duct through the metallic common bile duct stent and into the duodenum without difficulty. A 6 French coaxial introducer set was placed over the Nitrex wire. The inner cannula were removed and an Amplatz wire was directed adjacent to the Nitrex wire into the duodenum near the ligament of Treitz. Serial dilation was performed with 8 and 10 [place] dilators. A 10.2 [place] biliary drain with 10 cm of custom cut extra sideholes was then introduced with the pigtail portion coiled in the  fourth portion of the duodenum near the ligament of Treitz. Hand injection of contrast demonstrated opacification of the nondilated biliary tree which passed freely into the small bowel. The drain was affixed to the skin with 0 Prolene suture and a Stay Fix dressing. The patient tolerated the procedure well and was returned to the floor in stable condition. IMPRESSION: 1. Occluded indwelling metallic biliary stent at the distal end, presumed secondary to edema related to duodenitis/pancreatitis. 2. Indwelling biloma drain in satisfactory position. The cystic duct is patent. 3. Successful placement of an internal external biliary drain via right posterior bile ducts. The distal pigtail portion of the drain is positioned near the ligament of Treitz. 4. Ultrasound-guided paracentesis yielding 3 L of clear, straw-colored fluid. PLAN: Recommend capping biloma drain once any post-procedural hemobilia has resolved. Recommend capping trial of biliary drain 12-24 hours after capping of biloma drain. IR will follow closely. Electronically Signed   By: Ruthann Cancer MD   On: 05/30/2020 08:22   IR EXCHANGE BILIARY DRAIN  Result Date: 06/07/2020 INDICATION: 64 year old male with a prior subtotal cholecystectomy 01/24/2020 with  biliary leak and prior common bile duct stenting. The patient is now admitted with pancreatitis The patient has required percutaneous drainage of gallbladder fossa fluid, as well as internal external biliary drainage given the occluded duct stent. EXAM: Through the tube cholangiogram Routine exchange of internal external biliary drain Routine exchange of percutaneous right upper quadrant fluid collection drain MEDICATIONS: NONE ANESTHESIA/SEDATION: Moderate (conscious) sedation was employed during this procedure. A total of Versed 2.0 mg and Fentanyl 100 mcg was administered intravenously. Moderate Sedation Time: 17 minutes. The patient's level of consciousness and vital signs were monitored  continuously by radiology nursing throughout the procedure under my direct supervision. FLUOROSCOPY TIME:  Fluoroscopy Time: 2 minutes 54 seconds (75 mGy). COMPLICATIONS: NONE PROCEDURE: Informed written consent was obtained from the patient after a thorough discussion of the procedural risks, benefits and alternatives. All questions were addressed. Maximal Sterile Barrier Technique was utilized including caps, mask, sterile gowns, sterile gloves, sterile drape, hand hygiene and skin antiseptic. A timeout was performed prior to the initiation of the procedure. Patient positioned supine position. The right upper quadrant including the drain system were prepped and draped in the usual sterile fashion 1% lidocaine was used for local anesthesia. Contrast was first injected through the internal external biliary drain confirming occlusion. A combination of a Bentson wire and a 40 cm Kumpe the catheter were used to navigate the wire cross the occluded duct stent into the duodenum. Wire was removed and contrast injected confirmed location within the duodenum. We then injected the fossa drain, which confirmed a patent cystic duct communicating with the extrahepatic common bile duct. Contrast appeared to accumulate adjacent to the covered stent within the common hepatic duct. The fossa drain was then routinely exchange with modified Seldinger technique for any 12 [place] drain. A new 12 French internal/external biliary drain was advanced into the duodenum on the wire. The drain was appropriately positioned and a final image was stored. Patient remained hemodynamically stable throughout. No complications were encountered and no significant blood loss. FINDINGS: Injection of contrast through the gallbladder fossa drain confirms a patent cystic duct to the biliary system with contrast accumulating adjacent to the proximal covered stent in the common hepatic duct. No evidence of a fistula to the duodenum or the small bowel.  Contrast accumulating adjacent to the gallbladder fossa is believed to be retroperitoneal, within the fluid collection associated with the necrotizing pancreatitis. IMPRESSION: Status post through the tube cholangiogram, with routine exchange of gallbladder fossa percutaneous drain and internal/external biliary drain, with 12 French drains placed to gravity. Signed, Dulcy Fanny. Dellia Nims, RPVI [facility] Radiology Electronically Signed   By: Corrie Mckusick D.O.   On: 06/07/2020 14:59   IR EXCHANGE BILIARY DRAIN  Result Date: 06/07/2020 INDICATION: 64 year old male with a prior subtotal cholecystectomy 01/24/2020 with biliary leak and prior common bile duct stenting. The patient is now admitted with pancreatitis The patient has required percutaneous drainage of gallbladder fossa fluid, as well as internal external biliary drainage given the occluded duct stent. EXAM: Through the tube cholangiogram Routine exchange of internal external biliary drain Routine exchange of percutaneous right upper quadrant fluid collection drain MEDICATIONS: NONE ANESTHESIA/SEDATION: Moderate (conscious) sedation was employed during this procedure. A total of Versed 2.0 mg and Fentanyl 100 mcg was administered intravenously. Moderate Sedation Time: 17 minutes. The patient's level of consciousness and vital signs were monitored continuously by radiology nursing throughout the procedure under my direct supervision. FLUOROSCOPY TIME:  Fluoroscopy Time: 2 minutes  54 seconds (75 mGy). COMPLICATIONS: NONE PROCEDURE: Informed written consent was obtained from the patient after a thorough discussion of the procedural risks, benefits and alternatives. All questions were addressed. Maximal Sterile Barrier Technique was utilized including caps, mask, sterile gowns, sterile gloves, sterile drape, hand hygiene and skin antiseptic. A timeout was performed prior to the initiation of the procedure.  Patient positioned supine position. The right upper quadrant including the drain system were prepped and draped in the usual sterile fashion 1% lidocaine was used for local anesthesia. Contrast was first injected through the internal external biliary drain confirming occlusion. A combination of a Bentson wire and a 40 cm Kumpe the catheter were used to navigate the wire cross the occluded duct stent into the duodenum. Wire was removed and contrast injected confirmed location within the duodenum. We then injected the fossa drain, which confirmed a patent cystic duct communicating with the extrahepatic common bile duct. Contrast appeared to accumulate adjacent to the covered stent within the common hepatic duct. The fossa drain was then routinely exchange with modified Seldinger technique for any 12 [place] drain. A new 12 French internal/external biliary drain was advanced into the duodenum on the wire. The drain was appropriately positioned and a final image was stored. Patient remained hemodynamically stable throughout. No complications were encountered and no significant blood loss. FINDINGS: Injection of contrast through the gallbladder fossa drain confirms a patent cystic duct to the biliary system with contrast accumulating adjacent to the proximal covered stent in the common hepatic duct. No evidence of a fistula to the duodenum or the small bowel. Contrast accumulating adjacent to the gallbladder fossa is believed to be retroperitoneal, within the fluid collection associated with the necrotizing pancreatitis. IMPRESSION: Status post through the tube cholangiogram, with routine exchange of gallbladder fossa percutaneous drain and internal/external biliary drain, with 12 French drains placed to gravity. Signed, Dulcy Fanny. Dellia Nims, RPVI [facility] Radiology Electronically Signed   By: Corrie Mckusick D.O.   On: 06/07/2020 14:59   [place] EKG SITE RITE  Result Date:  05/18/2020 If Site Rite image not attached, placement could not be confirmed due to current cardiac rhythm.  IR Paracentesis  Result Date: 05/30/2020 INDICATION: 64 year old male with history of complex postoperative course after subtotal cholecystectomy for acute gangrenous cholecystitis in May 5329 complicated by biloma formation requiring percutaneous drainage on 02/13/2020 and placement of metallic common bile duct stent on 04/26/2020. Most recently, the patient has developed pancreatitis and duodenitis with ERCP findings consistent with obstruction of the distal common bile duct stent and subsequently high output from the biloma drain. Percutaneous cholangiogram and internal external biliary drain placement is requested for decompression and management of biliary fluid losses. EXAM: 1. Ultrasound-guided paracentesis 2. Cholangiogram via indwelling gallbladder fossa drain 3. Percutaneous cholangiogram 4. Placement of right-sided internal external biliary drain MEDICATIONS: Zosyn 3.375 g; The antibiotic was administered within an appropriate time frame prior to the initiation of the procedure. ANESTHESIA/SEDATION: Moderate (conscious) sedation was employed during this procedure. A total of Versed 2 mg and Fentanyl 75 mcg was administered intravenously. Moderate Sedation Time: 55 minutes. The patient's level of consciousness and vital signs were monitored continuously by radiology nursing throughout the procedure under my direct supervision. FLUOROSCOPY TIME:  Fluoroscopy Time: 17 minutes 18 seconds (81 mGy). COMPLICATIONS: None immediate. PROCEDURE: Informed written consent was obtained from the patient after a thorough discussion of the procedural risks, benefits and alternatives. All questions were addressed. Maximal Sterile Barrier Technique was  utilized including caps, mask, sterile gowns, sterile gloves, sterile drape, hand hygiene and skin antiseptic. A timeout was performed prior to the initiation of  the procedure. Preprocedure ultrasound demonstrated moderate to large volume ascites, including in the perihepatic space. The largest fluid pocket visualized in the right lower quadrant. Therefore the right lower quadrant was prepped and draped in standard fashion. The puncture site was planned. Subdermal Local anesthesia was provided with 1% lidocaine with 1:100,000 epinephrine, followed by deeper local anesthetic to the level of the peritoneum under ultrasound guidance. A 5 [place] Yueh needle was advanced into the peritoneal fluid under ultrasound guidance. A total of 3 L of clear, straw-colored fluid was removed during the course of the procedure. The right upper quadrant and indwelling biloma drain were prepped and draped in standard fashion. Hand injection of contrast via the indwelling biloma drain demonstrated appropriate position within the gallbladder fossa as well as a patent cystic duct. Contrast was visualized within the metallic biliary stent and filled retrograde into the right, nondilated bile ducts. The distal common bile duct stent was occluded. A right posterior duct was identified is in adequate puncture site for biliary drain. A small amount of subdermal Local anesthesia was provided to the right mid axillary line at planned entry site as well as deeper along the hepatic capsule. Under fluoroscopic guidance with multiple obliquities, a 22 gauge Chiba needle was directed toward the opacified right posterior duct. The duct was punctured successfully, confirmed by gentle hand injection of contrast. A Nitrex wire was then directed into the right posterior duct through the metallic common bile duct stent and into the duodenum without difficulty. A 6 French coaxial introducer set was placed over the Nitrex wire. The inner cannula were removed and an Amplatz wire was directed adjacent to the Nitrex wire into the duodenum near the ligament of Treitz. Serial dilation was performed with 8 and 10 [place]  dilators. A 10.2 [place] biliary drain with 10 cm of custom cut extra sideholes was then introduced with the pigtail portion coiled in the fourth portion of the duodenum near the ligament of Treitz. Hand injection of contrast demonstrated opacification of the nondilated biliary tree which passed freely into the small bowel. The drain was affixed to the skin with 0 Prolene suture and a Stay Fix dressing. The patient tolerated the procedure well and was returned to the floor in stable condition. IMPRESSION: 1. Occluded indwelling metallic biliary stent at the distal end, presumed secondary to edema related to duodenitis/pancreatitis. 2. Indwelling biloma drain in satisfactory position. The cystic duct is patent. 3. Successful placement of an internal external biliary drain via right posterior bile ducts. The distal pigtail portion of the drain is positioned near the ligament of Treitz. 4. Ultrasound-guided paracentesis yielding 3 L of clear, straw-colored fluid. PLAN: Recommend capping biloma drain once any post-procedural hemobilia has resolved. Recommend capping trial of biliary drain 12-24 hours after capping of biloma drain. IR will follow closely. Electronically Signed   By: Ruthann Cancer MD   On: 05/30/2020 08:22   IR Paracentesis  Result Date: 05/19/2020 INDICATION: 64 year old male with a complex history of acute cholecystitis status post biliary leak following laparoscopic cholecystectomy status post placement of a biliary drainage catheter complicated by severe pancreatitis. Patient currently has a percutaneous drainage catheter in the gallbladder space and has also developed new mild ascites. His gallbladder fossa drain output had been fairly copious in there is prior imaging documentation of a fistulous communication with the cystic duct remanent.  His drain output has suddenly slowed. He presents for drain injection with possible exchange/upsize if needed as well as ultrasound-guided paracentesis for  diagnostic purposes. EXAM: 1. Drain injection 2. Ultrasound-guided paracentesis MEDICATIONS: The patient is currently admitted to the hospital and receiving intravenous antibiotics. The antibiotics were administered within an appropriate time frame prior to the initiation of the procedure. ANESTHESIA/SEDATION: None. COMPLICATIONS: None immediate. PROCEDURE: Informed written consent was obtained from the patient after a thorough discussion of the procedural risks, benefits and alternatives. All questions were addressed. Maximal Sterile Barrier Technique was utilized including caps, mask, sterile gowns, sterile gloves, sterile drape, hand hygiene and skin antiseptic. A timeout was performed prior to the initiation of the procedure. Hand injection of contrast material was performed through the existing right upper quadrant drainage catheter. The drainage catheter is patent. The contrast material fills a collapsed space adjacent to the drainage catheter. There is partial filling of the cystic duct, but no contrast material enters the common bile duct. Attention was turned to the right lower quadrant. The abdomen was interrogated with ultrasound. There is small volume ascites. A suitable skin entry site was selected and marked. Local anesthesia was attained by infiltration with 1% lidocaine. A small dermatotomy was made. A 6 French Safe-T-Centesis catheter was then advanced through the abdominal wall into the ascites. Aspiration was performed yielding 1.4 L of bloody ascites. IMPRESSION: 1. Well-positioned and normally functioning right upper quadrant drainage catheter. The fistulous communication between the drainage catheter and the cystic duct appears to be nearly resolved. This likely explains the decreased output. No exchange or upsize necessary. 2. Successful ultrasound-guided diagnostic paracentesis yielding 1.4 L of bloody ascitic fluid. Samples were sent as requested. Electronically Signed   By: Jacqulynn Cadet M.D.   On: 05/19/2020 12:41    (Echo, Carotid, EGD, Colonoscopy, ERCP)    Subjective:   Discharge Exam: Vitals:   06/15/20 0429 06/15/20 0739  BP:  112/72  Pulse: (!) 109 (!) 109  Resp: 17 18  Temp:  97.9 F (36.6 C)  SpO2:  95%   Vitals:   06/14/20 2358 06/15/20 0330 06/15/20 0429 06/15/20 0739  BP: 134/61 119/75  112/72  Pulse: (!) 109 (!) 110 (!) 109 (!) 109  Resp: 16 17 17 18   Temp: 99.9 F (37.7 C) 98.5 F (36.9 C)  97.9 F (36.6 C)  TempSrc: Axillary Oral  Oral  SpO2: 99% 92%  95%  Weight:   89.2 kg   Height:        General: Pt is alert, awake, not in acute distress Cardiovascular: RRR, S1/S2 +, no rubs, no gallops Respiratory: CTA bilaterally, no wheezing, no rhonchi Abdominal: Soft, NT, ND, bowel sounds + Extremities: no edema, no cyanosis    The results of significant diagnostics from this hospitalization (including imaging, microbiology, ancillary and laboratory) are listed below for reference.     Microbiology: Recent Results (from the past 240 hour(s))  Culture, blood (routine x 2)     Status: None (Preliminary result)   Collection Time: 06/14/20  3:16 PM   Specimen: BLOOD  Result Value Ref Range Status   Specimen Description BLOOD LEFT ANTECUBITAL  Final   Special Requests   Final    BOTTLES DRAWN AEROBIC AND ANAEROBIC Blood Culture adequate volume   Culture   Final    NO GROWTH < 24 HOURS Performed at [facility], [address], [place], [place] [address]    Report Status PENDING  Incomplete  Culture, blood (  routine x 2)     Status: None (Preliminary result)   Collection Time: 06/14/20  3:52 PM   Specimen: BLOOD LEFT HAND  Result Value Ref Range Status   Specimen Description BLOOD LEFT HAND  Final   Special Requests   Final    BOTTLES DRAWN AEROBIC AND ANAEROBIC Blood Culture adequate volume   Culture   Final    NO GROWTH < 24 HOURS Performed at [facility], [place] [address] [place], [place] [address]     Report Status PENDING  Incomplete     Labs: BNP (last 3 results) Recent Labs    04/29/20 0202  BNP 092.3*   Basic Metabolic Panel: Recent Labs  Lab 06/09/20 0614 06/10/20 0500 06/11/20 0440 06/12/20 0347 06/13/20 0407 06/14/20 0445  NA 143 142 143 143 144  --   K 4.1 3.9 4.1 4.1 4.0  --   CL 112* 111 112* 110 110  --   CO2 23 23 24 25 25   --   GLUCOSE 186* 165* 160* 178* 162*  --   BUN 35* 36* 36* 36* 33*  --   CREATININE 0.66 0.68 0.70 0.76 0.77  --   CALCIUM 8.7* 8.5* 8.5* 8.5* 8.5*  --   MG 2.3  --  2.2  --   --  2.1  PHOS  --   --  4.3  --   --  4.0   Liver Function Tests: Recent Labs  Lab 06/09/20 0614 06/11/20 0440 06/12/20 0347 06/13/20 0407  AST 23 28 28  37  ALT 28 35 35 41  ALKPHOS 232* 200* 185* 162*  BILITOT 0.7 1.0 0.9 0.9  PROT 6.3* 6.4* 6.3* 6.4*  ALBUMIN 1.6* 1.6* 1.5* 1.5*   No results for input(s): LIPASE, AMYLASE in the last 168 hours. No results for input(s): AMMONIA in the last 168 hours. CBC: Recent Labs  Lab 06/09/20 0614 06/10/20 0500 06/11/20 0545 06/12/20 0347 06/13/20 0407  WBC 20.5* 18.1* 17.0* 15.1* 15.5*  NEUTROABS  --   --  14.1* 12.6* 12.6*  HGB 7.5* 7.2* 7.6* 7.5* 7.7*  HCT 26.4* 25.9* 27.3* 26.7* 27.5*  MCV 91.3 90.9 91.3 89.0 89.0  PLT 306 302 307 326 326   Cardiac Enzymes: No results for input(s): CKTOTAL, CKMB, CKMBINDEX, TROPONINI in the last 168 hours. BNP: Invalid input(s): POCBNP CBG: Recent Labs  Lab 06/14/20 1547 06/14/20 1954 06/15/20 0000 06/15/20 0333 06/15/20 0737  GLUCAP 129* 147* 124* 135* 127*   D-Dimer No results for input(s): DDIMER in the last 72 hours. Hgb A1c No results for input(s): HGBA1C in the last 72 hours. Lipid Profile No results for input(s): CHOL, HDL, LDLCALC, TRIG, CHOLHDL, LDLDIRECT in the last 72 hours. Thyroid function studies No results for input(s): TSH, T4TOTAL, T3FREE, THYROIDAB in the last 72 hours.  Invalid input(s): FREET3 Anemia work up No results for  input(s): VITAMINB12, FOLATE, FERRITIN, TIBC, IRON, RETICCTPCT in the last 72 hours. Urinalysis    Component Value Date/Time   COLORURINE AMBER (A) 04/25/2020 2109   APPEARANCEUR CLEAR 04/25/2020 2109   LABSPEC 1.026 04/25/2020 2109   PHURINE 5.0 04/25/2020 2109   GLUCOSEU NEGATIVE 04/25/2020 2109   HGBUR NEGATIVE 04/25/2020 2109   BILIRUBINUR NEGATIVE 04/25/2020 2109   KETONESUR 5 (A) 04/25/2020 2109   PROTEINUR 30 (A) 04/25/2020 2109   NITRITE NEGATIVE 04/25/2020 2109   LEUKOCYTESUR NEGATIVE 04/25/2020 2109   Sepsis Labs Invalid input(s): PROCALCITONIN,  WBC,  LACTICIDVEN Microbiology Recent Results (from the past 240  hour(s))  Culture, blood (routine x 2)     Status: None (Preliminary result)   Collection Time: 06/14/20  3:16 PM   Specimen: BLOOD  Result Value Ref Range Status   Specimen Description BLOOD LEFT ANTECUBITAL  Final   Special Requests   Final    BOTTLES DRAWN AEROBIC AND ANAEROBIC Blood Culture adequate volume   Culture   Final    NO GROWTH < 24 HOURS Performed at [facility], [place] [address], [place], [place] [address]    Report Status PENDING  Incomplete  Culture, blood (routine x 2)     Status: None (Preliminary result)   Collection Time: 06/14/20  3:52 PM   Specimen: BLOOD LEFT HAND  Result Value Ref Range Status   Specimen Description BLOOD LEFT HAND  Final   Special Requests   Final    BOTTLES DRAWN AEROBIC AND ANAEROBIC Blood Culture adequate volume   Culture   Final    NO GROWTH < 24 HOURS Performed at [facility], [place] [address] [place], [place] [address]    Report Status PENDING  Incomplete     Time coordinating discharge: 39 minutes  SIGNED:   Georgette Shell, MD  [facility] 06/15/2020, 11:14 AM

## 2020-06-15 NOTE — TOC Transition Note (Addendum)
Transition of Care (TOC) - CM/SW Discharge Note Marvetta Gibbons RN,BSN Transitions of Care Unit 4NP (non trauma) - RN Case Manager See Treatment Team for direct Phone #   Patient Details  Name: Adam Fowler MRN: [id number] Date of Birth: 12/10/1955  Transition of Care [facility]) CM/SW Contact:  Dawayne Patricia, RN Phone Number: 06/15/2020, 3:11 PM   Clinical Narrative:    Wife has made decision for comfort care and to take pt home with home hospice services- she would like to take pt home as soon as possible- has been speaking with [facility] already. Pt will need hospital bed and home 02 delivered prior to discharge.  Call made to Cheri with [facility] for home hospice referral- they will start working on hospice eligibility and ordering DME for delivery today per [employer]. Plan will be for pt to go home via [facility] today once DME is in the home- wife is aware that it may be a late discharge and is ok with whatever the hour may be. Hospice is prepared to be in the home at whatever time pt arrives.   [place] has delivered pain med to the floor to send home with pt. Call made to wife to verify address for [facility], GOLD DNR form has been signed. Paper work for [employer] to be placed on chart for transport. Spoke with Hospice RN who is already at the home- per RN they will have Hospice social worker f/u with wife regarding her questions about disability and such next week. They are still awaiting delivery of DME at this time.   1530- received call from [place] with [facility]- DME is set to be delivered to the home within the next 30 min- will go ahead and set up [facility] transport for 4pm- call made to [facility] and arranged transport for 4pm- there are multiple pts ahead so may be awhile for transport. Bedside RN aware- All paperwork has been placed in shadow chart- RN to send pain meds from [facility] home with transport.   Final next level of care: Home w Hospice Care Barriers to Discharge:  Equipment Delay   Patient Goals and CMS Choice Patient states their goals for this hospitalization and ongoing recovery are:: decision for comfort care and home hospice CMS Medicare.gov Compare Post Acute Care list provided to:: Patient Represenative (must comment) (Patient's wife) Choice offered to / list presented to : Spouse  Discharge Placement               Home with Hospice        Discharge Plan and Services   Discharge Planning Services: CM Consult Post Acute Care Choice: Hospice          DME Arranged: Hospital bed, Oxygen, Overbed table DME Agency: [employer] Date DME Agency Contacted: 06/15/20 Time DME Agency Contacted:  (per Hospice) Representative spoke with at DME Agency: as per [place] with [facility] Arranged: Disease Management [place] Agency: [place] Date [place]: 06/15/20 Time HH Agency Contacted: 1100 Representative spoke with at [place]: [place]) Interventions     Readmission Risk Interventions Readmission Risk Prevention Plan 06/15/2020 05/04/2020  Transportation Screening Complete Complete  PCP or Specialist Appt within 5-7 Days - Complete  Home Care Screening - Complete  Medication Review (RN CM) - Complete  Medication Review (RN Care Manager) Complete -  PCP or Specialist appointment within 3-5 days of discharge Complete -  [place] or Home Care  Consult Complete -  SW Recovery Care/Counseling Consult Complete -  Palliative Care Screening Complete -  [place] Not Applicable -  Some recent data might be hidden

## 2020-06-15 NOTE — Progress Notes (Signed)
Palliative:   Conference with bedside nursing staff related to wife's desire to bring [place] home with [facility].    Mr. Swim is lying quietly in bed with daughter Kellie Simmering at bedside.  We talk about going home with [facility].  Patient and family request dc as soon as possible.  We talk about equipment needs. Family would like a hospital bed and feel that Mr. Thorpe would benefit from oxygen.  Melynda tells me that she understand her father will have "antibiotics for comfort".  I share this is not usual, but will be determined by Hospice provider.    I share that TPN will stop, we will "untie" [name] from all the tubes and wires.  He makes eye contact and states, "Thank you".    Call to wife [name].  We talk about her plan to bring [name] home with [facility] Understands that he will not receive TPN.  [name] tells me that she believes Mr. Humbarger is terminal.  We talk about what is and is not provided with hospice.   I share that she has chosen a loving way to care for her spouse.  She is tearful.    Daughter Kellie Simmering comes to the hall way to ask more questions, reassurance provided.  She asks about prognosis, "2 weeks",  I share that time may be shorter, but they will be supported by Hospice.   Detailed conference with attending, bedside nursing staff, [facility] team related to patient condition, needs, GOC, disposition with at home   Plan:  Comfort and dignity at end of life, home with [facility] for comfort care Prognosis: days anticipated    [place] via Ambulance to address listed in demographics Keep drain/external cath  65 minutes, extended time   Quinn Axe, NP  Palliative Medicine Team  Greater than 50% of this time was spent counseling and coordinating care related to the above assessment and plan.

## 2020-06-15 NOTE — Progress Notes (Signed)
Ativan 1mg  and Morphine 2.4mg  wasted with Moreen Fowler

## 2020-06-19 LAB — CULTURE, BLOOD (ROUTINE X 2)
Culture: NO GROWTH
Culture: NO GROWTH
Special Requests: ADEQUATE
Special Requests: ADEQUATE

## 2020-08-08 DIAGNOSIS — Z7189 Other specified counseling: Secondary | ICD-10-CM

## 2021-06-29 IMAGING — DX DG ABD PORTABLE 1V
1 series · 1 of 1 positions shown · non-contrast
Comparison: April 29, 2020.

CLINICAL DATA: Ileus.

EXAM:
PORTABLE ABDOMEN - 1 VIEW

[abdomen]
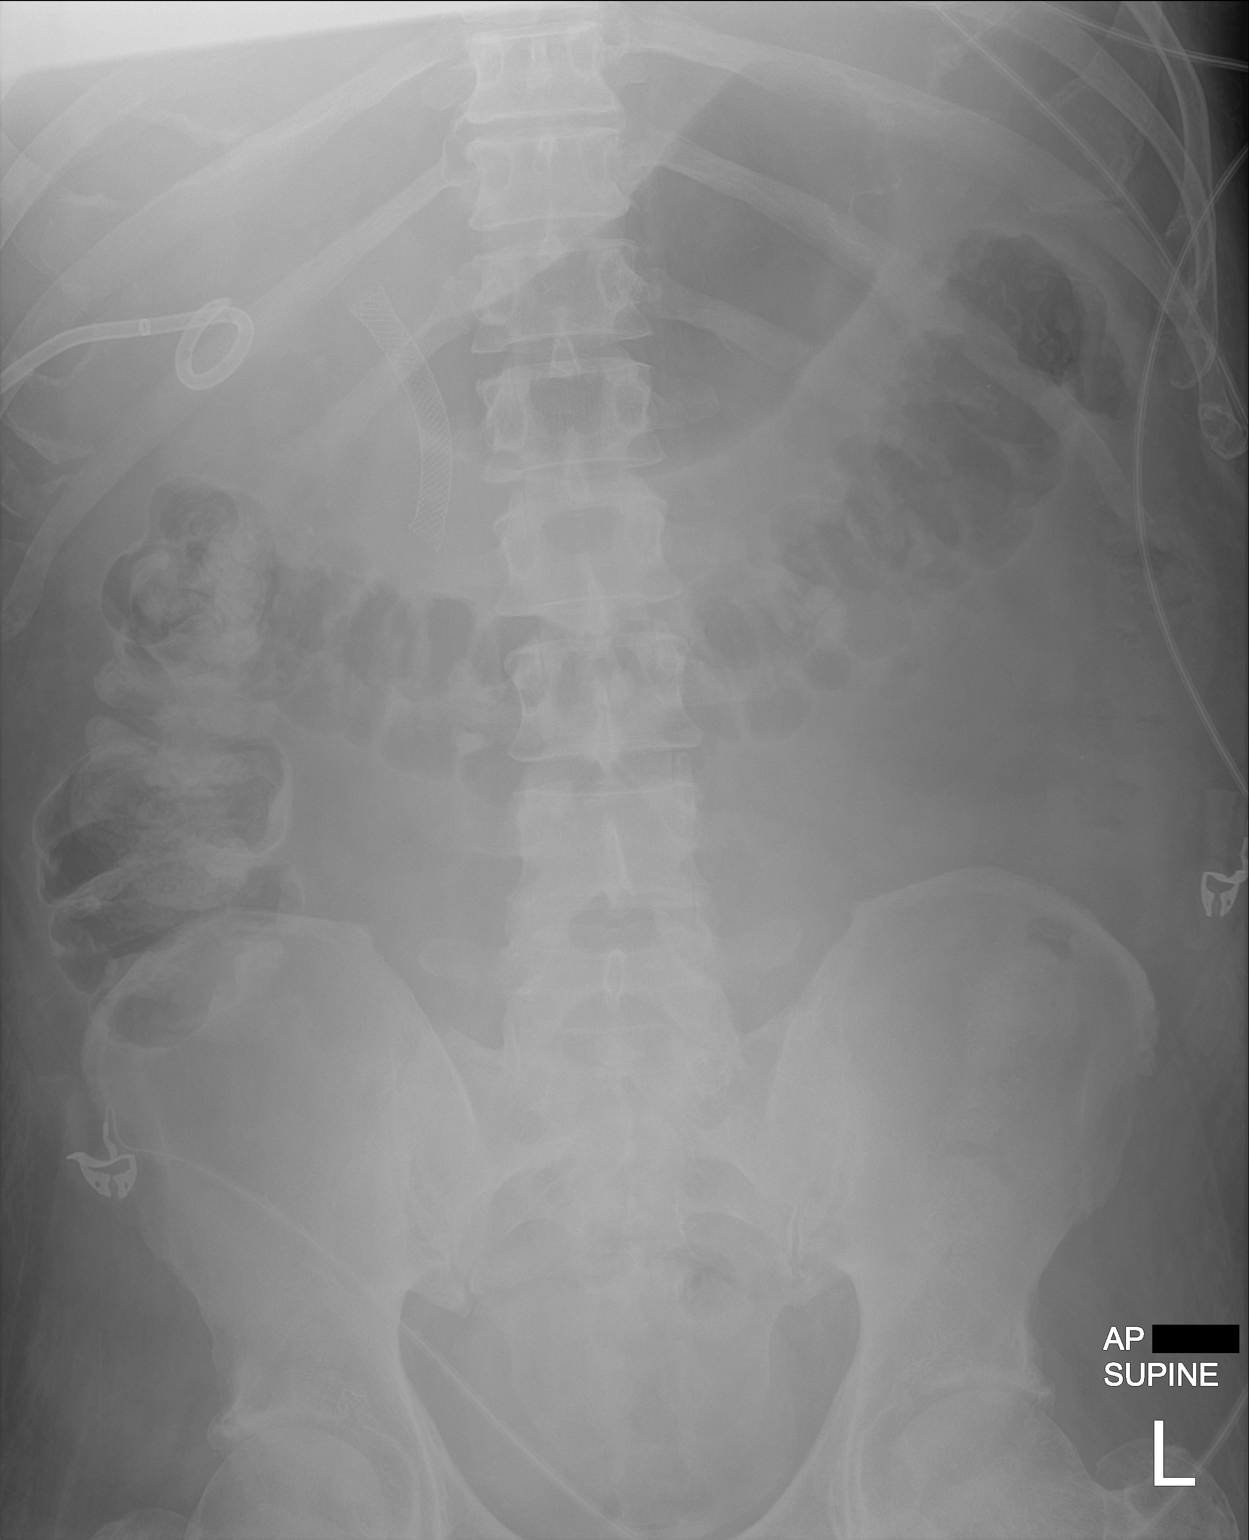

[1 of 1 positions shown; findings below may reference images not displayed]

FINDINGS: The bowel gas pattern is normal. Pigtail drainage catheter is noted
in right upper quadrant. Biliary stent is noted.
IMPRESSION: No evidence of bowel obstruction or ileus.

## 2021-06-30 IMAGING — CR DG LUMBAR SPINE 2-3V
2 series · 2 of 2 positions shown · non-contrast
Comparison: CT abdomen/pelvis dated 04/28/2020

CLINICAL DATA: Back pain

EXAM:
LUMBAR SPINE - 2-3 VIEW

[l-spine lat]
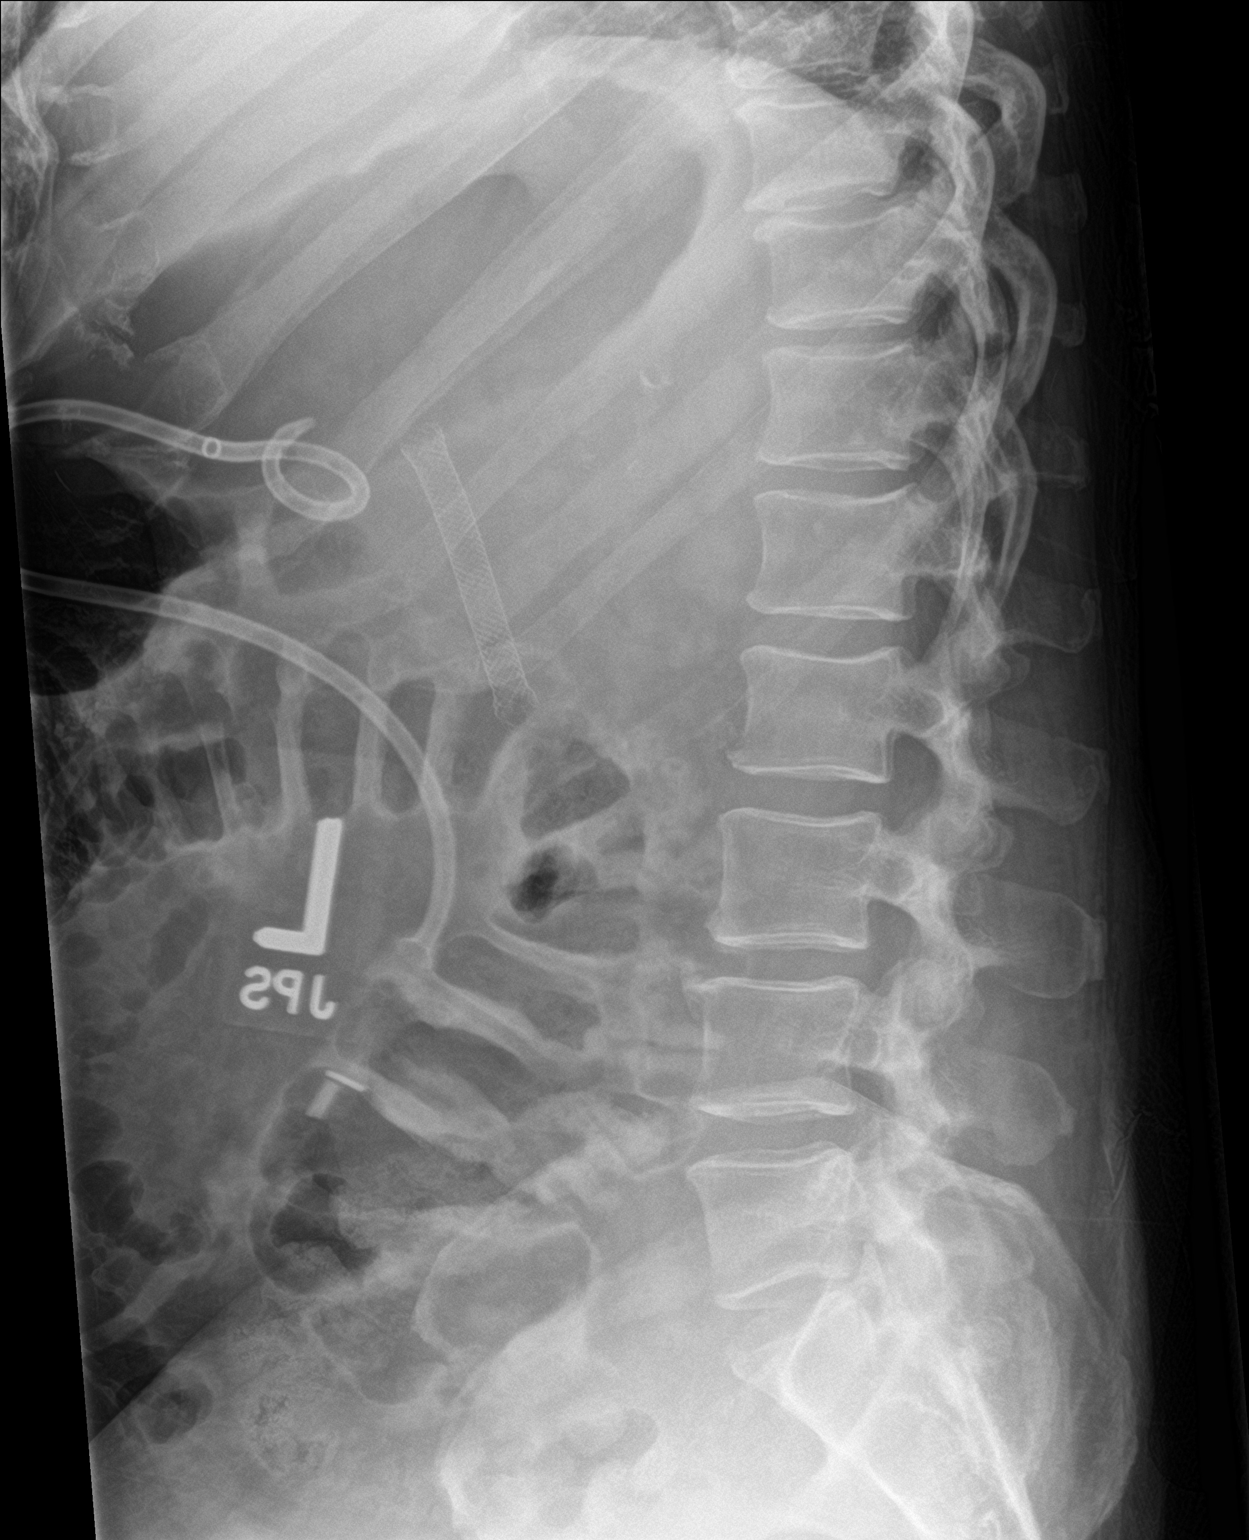

[l-spine ap]
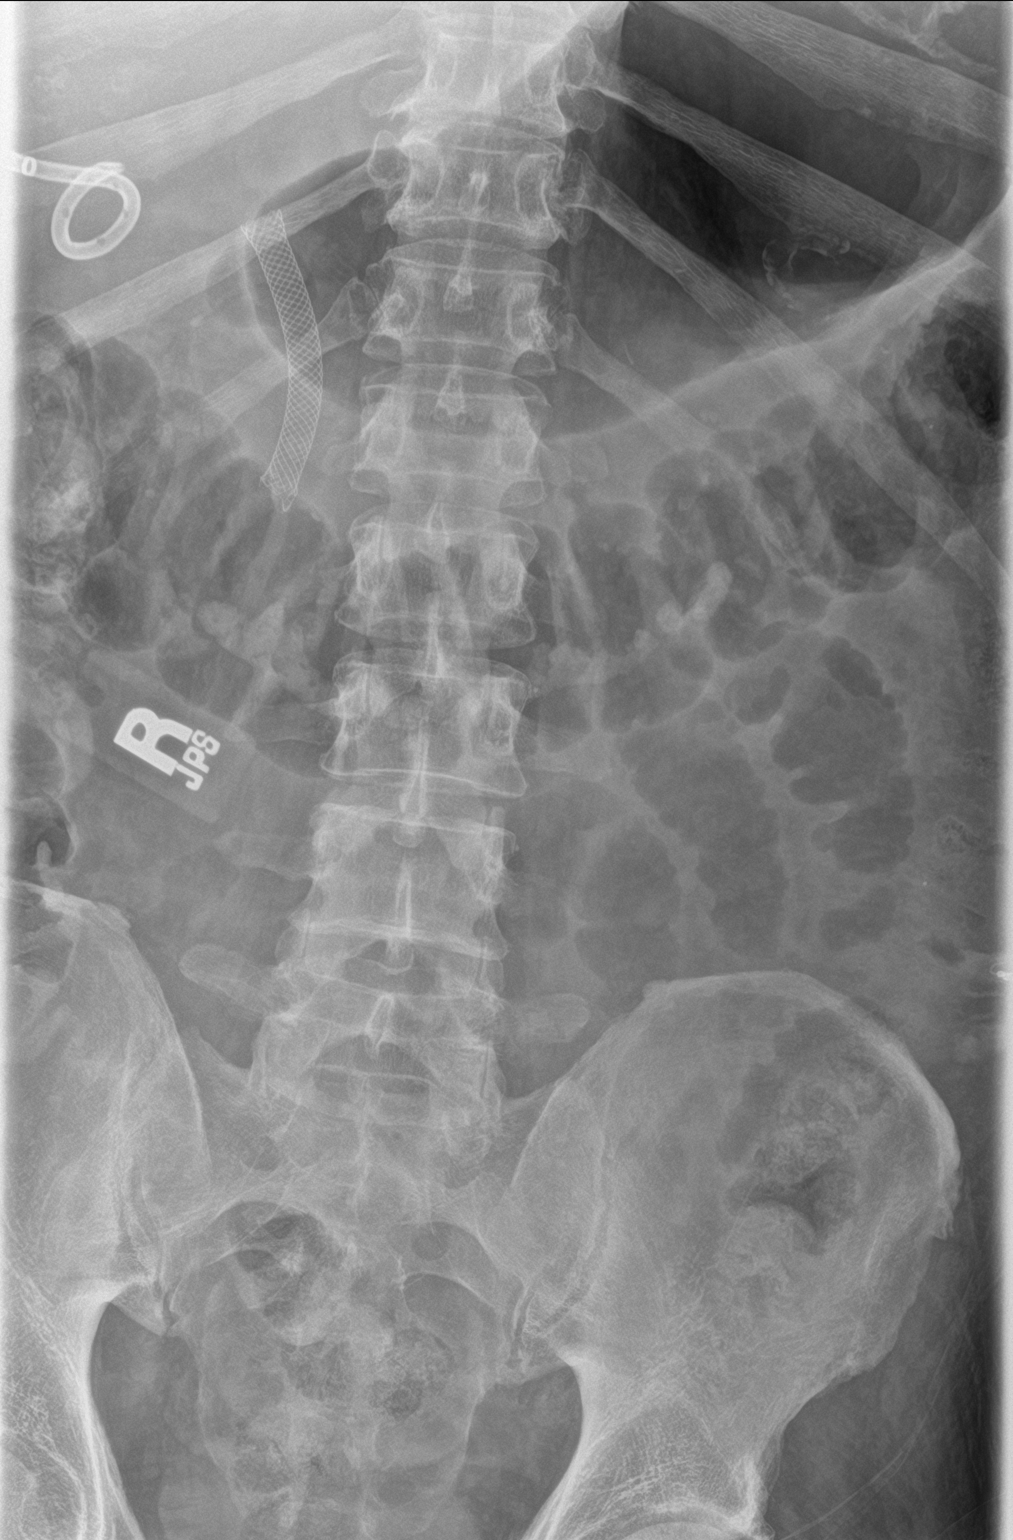

[2 of 2 positions shown; findings below may reference images not displayed]

FINDINGS: Five lumbar-type vertebral bodies. No evidence of fracture or
dislocation. Vertebral body heights and intervertebral disc spaces
are maintained.

Mild degenerative changes at L3-4.

Visualized bony pelvis appears intact.
IMPRESSION: Negative.

## 2021-07-01 IMAGING — DX DG CHEST 1V PORT
1 series · 1 of 1 positions shown · non-contrast
Comparison: Chest x-ray 12/27/2019.

CLINICAL DATA: Shortness of breath.

EXAM:
PORTABLE CHEST 1 VIEW

[chest ap]
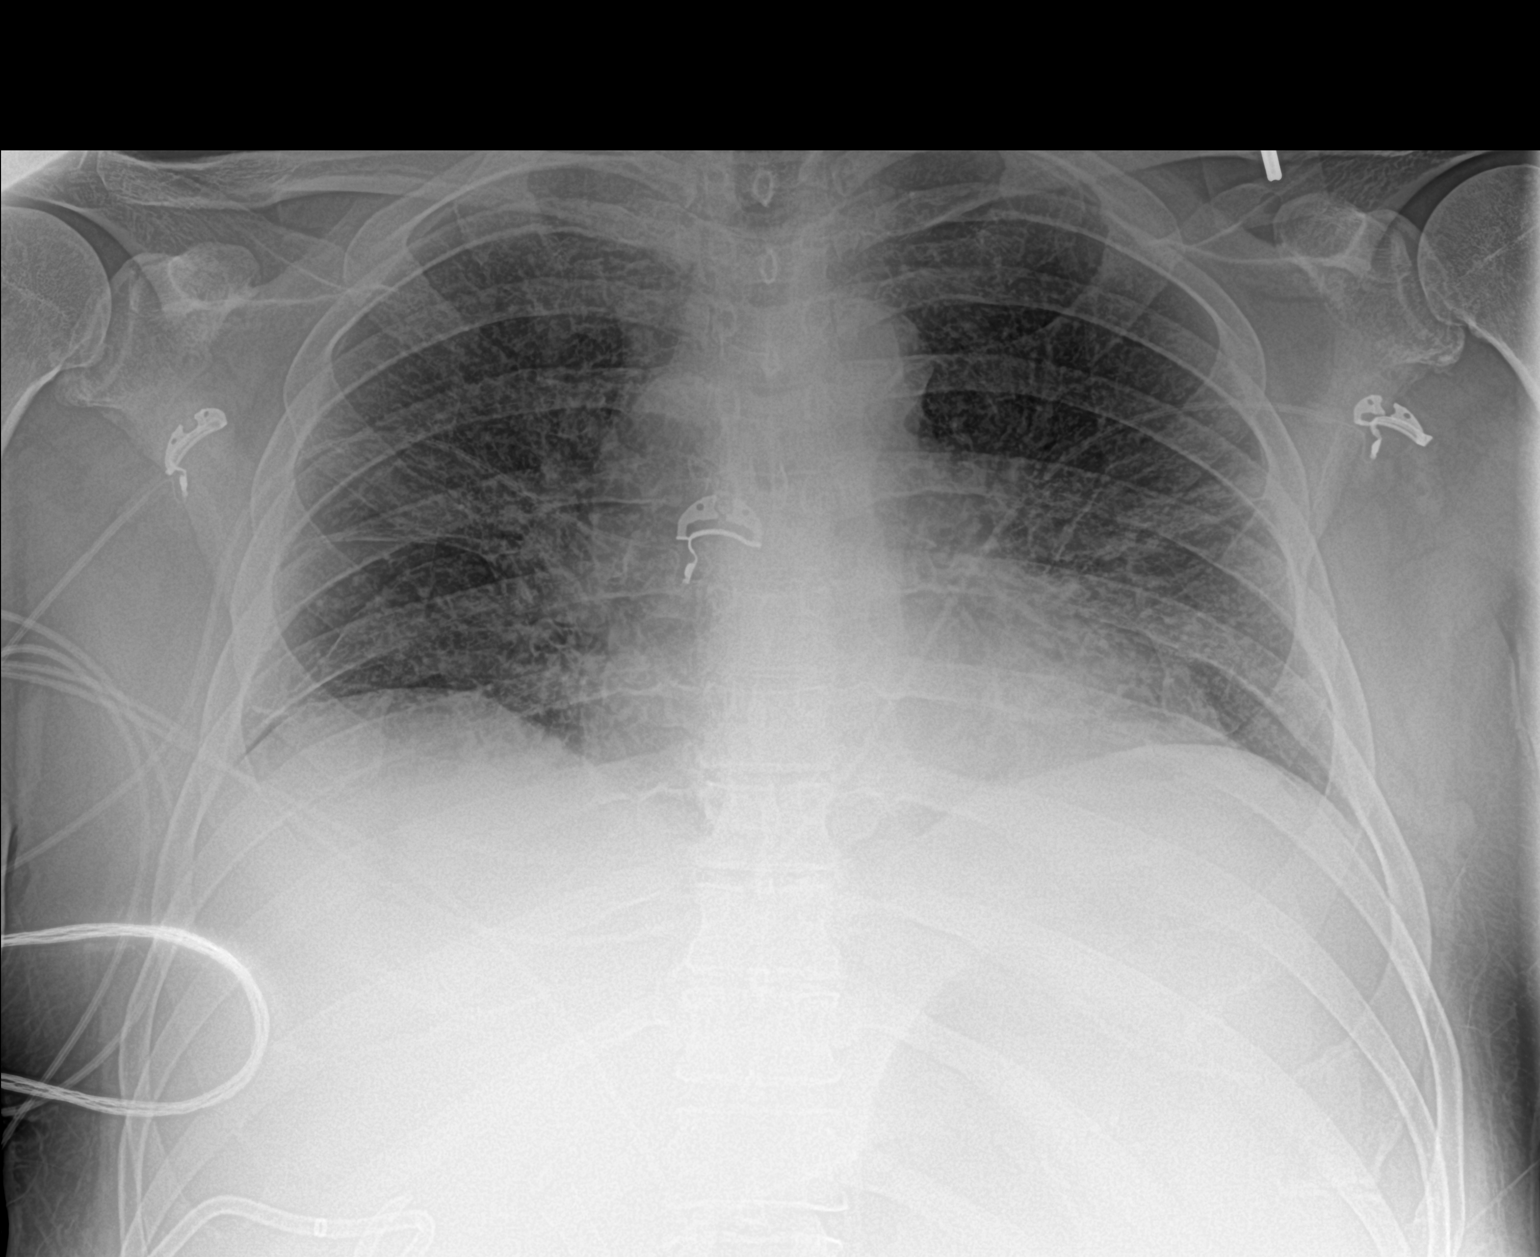

[1 of 1 positions shown; findings below may reference images not displayed]

FINDINGS: Interim removal of endotracheal tube. Heart size stable. Low lung
volumes with bibasilar atelectasis/infiltrates again noted. Similar
findings on prior exam. No pleural effusion or pneumothorax.
Drainage catheter again noted over the right upper quadrant. No
acute bony abnormality identified.
IMPRESSION: 1.  Interim removal endotracheal tube.

2. Low lung volumes with bibasilar atelectasis/infiltrates again
noted. Similar findings noted on prior exam.

## 2021-07-02 IMAGING — RF DG ABDOMEN 1V
1 series · 1 of 1 positions shown · non-contrast
Comparison: CT 05/03/2020

CLINICAL DATA: Feeding tube placement by [place] team

EXAM:
ABDOMEN - 1 VIEW

[Series 1: fluoro_iodine 1fps_bw · 0.18mm/px · 1 of 1 slices shown]
[im 1/1]
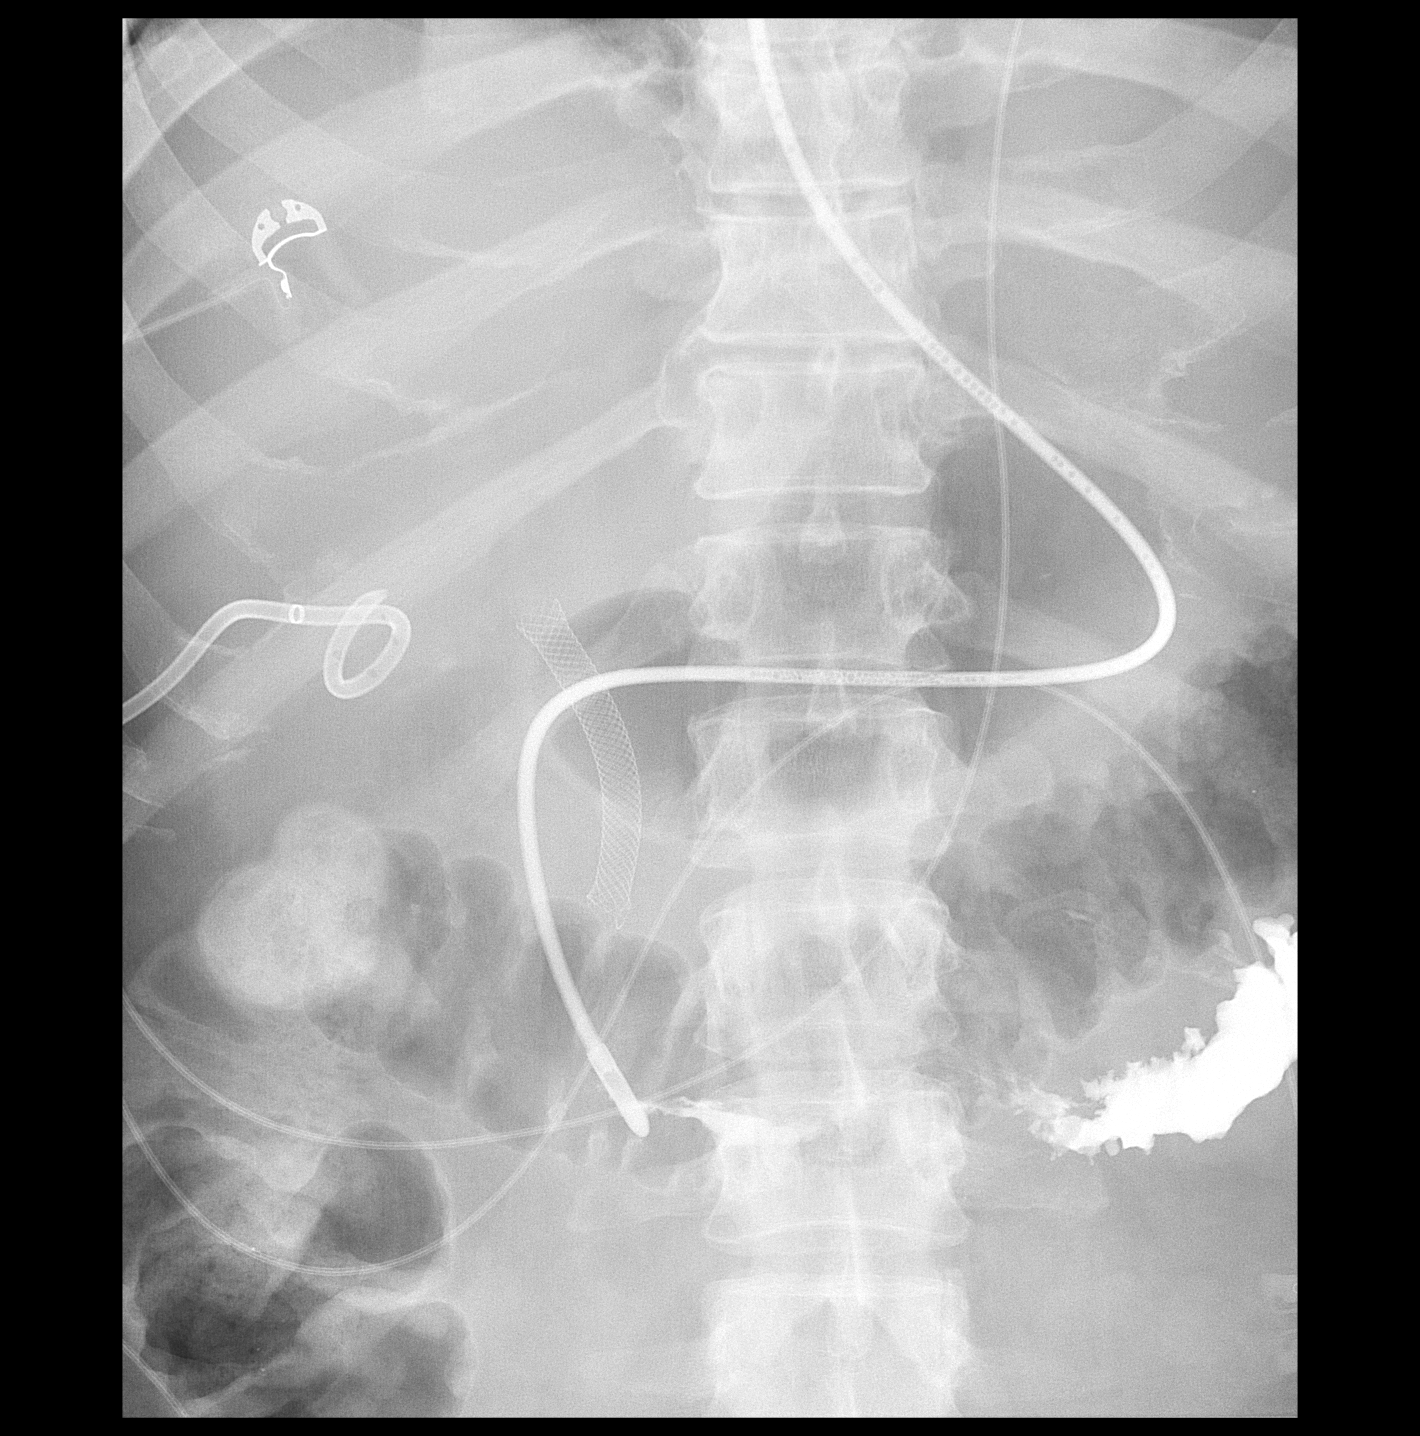

[1 of 1 positions shown; findings below may reference images not displayed]

FINDINGS: Feeding tube tip is post pyloric and likely at the junction of the
second and third portions of the duodenum. Contrast is noted within
the more distal duodenum. Partially imaged pigtail drainage catheter
and common bile duct stent.
IMPRESSION: Feeding tube tip is post pyloric and likely at the junction of the
second and third portions of the duodenum.
